# Patient Record
Sex: Female | Born: 1945 | Race: Black or African American | Hispanic: No | Marital: Married | Smoking: Former smoker
Health system: Southern US, Community
[De-identification: ages and names within clinical notes are randomized; demographics above are authoritative.]

## PROBLEM LIST (undated history)

## (undated) DIAGNOSIS — I10 Essential (primary) hypertension: Secondary | ICD-10-CM

## (undated) DIAGNOSIS — I471 Supraventricular tachycardia: Secondary | ICD-10-CM

## (undated) DIAGNOSIS — I272 Pulmonary hypertension, unspecified: Secondary | ICD-10-CM

## (undated) DIAGNOSIS — R739 Hyperglycemia, unspecified: Secondary | ICD-10-CM

## (undated) DIAGNOSIS — E785 Hyperlipidemia, unspecified: Secondary | ICD-10-CM

## (undated) DIAGNOSIS — D638 Anemia in other chronic diseases classified elsewhere: Secondary | ICD-10-CM

## (undated) DIAGNOSIS — E7800 Pure hypercholesterolemia, unspecified: Secondary | ICD-10-CM

## (undated) DIAGNOSIS — I739 Peripheral vascular disease, unspecified: Secondary | ICD-10-CM

## (undated) DIAGNOSIS — I5032 Chronic diastolic (congestive) heart failure: Secondary | ICD-10-CM

## (undated) DIAGNOSIS — J449 Chronic obstructive pulmonary disease, unspecified: Secondary | ICD-10-CM

## (undated) DIAGNOSIS — N189 Chronic kidney disease, unspecified: Secondary | ICD-10-CM

## (undated) DIAGNOSIS — K429 Umbilical hernia without obstruction or gangrene: Secondary | ICD-10-CM

## (undated) DIAGNOSIS — I480 Paroxysmal atrial fibrillation: Secondary | ICD-10-CM

## (undated) DIAGNOSIS — I4719 Other supraventricular tachycardia: Secondary | ICD-10-CM

## (undated) DIAGNOSIS — N184 Chronic kidney disease, stage 4 (severe): Secondary | ICD-10-CM

## (undated) HISTORY — DX: Umbilical hernia without obstruction or gangrene: K42.9

## (undated) HISTORY — DX: Peripheral vascular disease, unspecified: I73.9

## (undated) HISTORY — DX: Pure hypercholesterolemia, unspecified: E78.00

## (undated) HISTORY — PX: TUBAL LIGATION: SHX77

---

## 2007-12-09 ENCOUNTER — Ambulatory Visit: Payer: Self-pay | Admitting: Family Medicine

## 2007-12-09 ENCOUNTER — Observation Stay (HOSPITAL_COMMUNITY): Admission: EM | Admit: 2007-12-09 | Discharge: 2007-12-10 | Payer: Self-pay | Admitting: Emergency Medicine

## 2007-12-09 IMAGING — CR DG CHEST 1V PORT
1 series · 1 of 1 positions shown · non-contrast
Comparison: None

CLINICAL DATA: Chest pain

PORTABLE CHEST - 1 VIEW

[view not recorded]
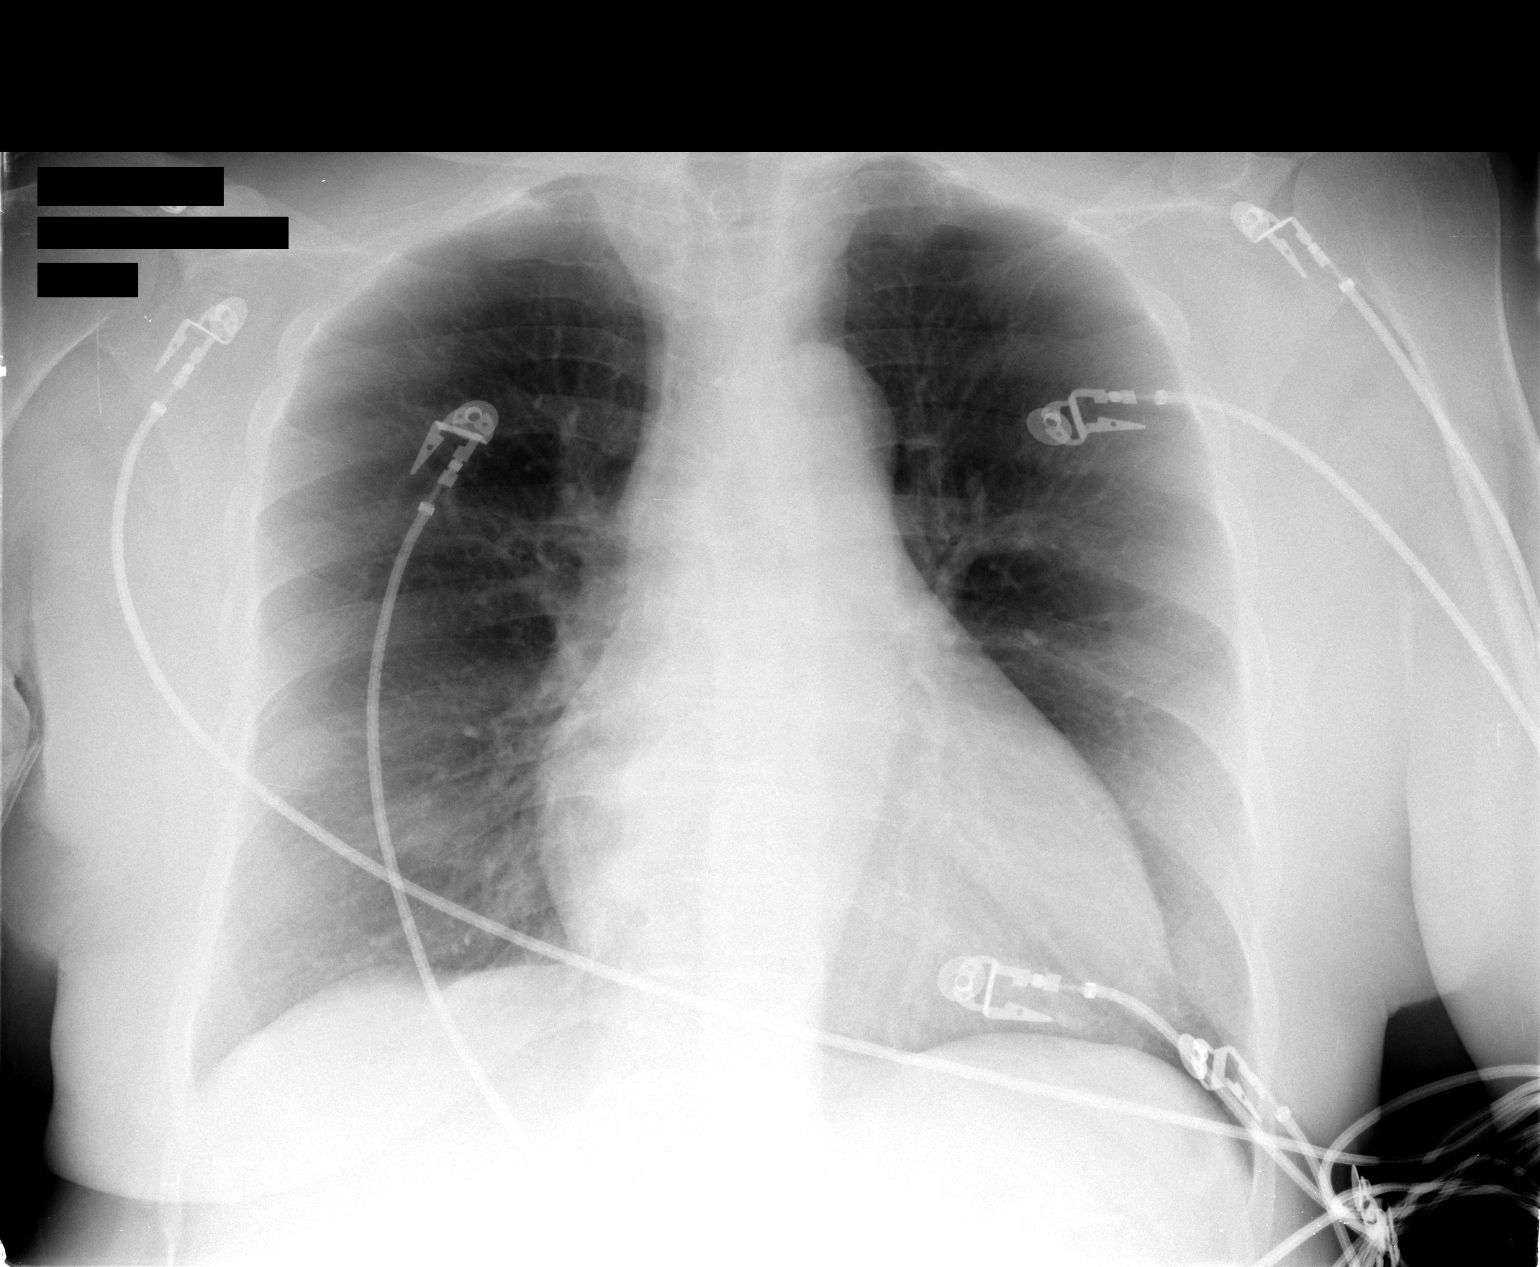

[1 of 1 positions shown; findings below may reference images not displayed]

FINDINGS: Borderline cardiomegaly noted.  No acute infiltrate or
pleural effusion.  No pulmonary edema.  The bony structures are
unremarkable.
IMPRESSION: No active disease.  Borderline cardiomegaly.

## 2007-12-10 ENCOUNTER — Encounter: Payer: Self-pay | Admitting: Family Medicine

## 2007-12-10 DIAGNOSIS — I10 Essential (primary) hypertension: Secondary | ICD-10-CM | POA: Insufficient documentation

## 2007-12-10 LAB — CONVERTED CEMR LAB
HDL: 37 mg/dL
LDL Cholesterol: 99 mg/dL

## 2007-12-11 ENCOUNTER — Encounter: Payer: Self-pay | Admitting: Family Medicine

## 2007-12-12 ENCOUNTER — Encounter: Payer: Self-pay | Admitting: Family Medicine

## 2007-12-12 ENCOUNTER — Ambulatory Visit: Payer: Self-pay | Admitting: Family Medicine

## 2007-12-12 LAB — CONVERTED CEMR LAB
ALT: <8 units/L (ref 0–35)
AST: 16 units/L (ref 0–37)
Albumin: 4.4 g/dL (ref 3.5–5.2)
Alkaline Phosphatase: 96 units/L (ref 39–117)
BUN: 44 mg/dL — ABNORMAL HIGH (ref 6–23)
CO2: 21 meq/L (ref 19–32)
Calcium: 9.6 mg/dL (ref 8.4–10.5)
Chloride: 106 meq/L (ref 96–112)
Creatinine, Ser: 1.93 mg/dL — ABNORMAL HIGH (ref 0.40–1.20)
Glucose, Bld: 98 mg/dL (ref 70–99)
Potassium: 5.1 meq/L (ref 3.5–5.3)
Sodium: 140 meq/L (ref 135–145)
Total Bilirubin: 0.4 mg/dL (ref 0.3–1.2)
Total Protein: 8.3 g/dL (ref 6.0–8.3)

## 2007-12-22 ENCOUNTER — Ambulatory Visit: Payer: Self-pay | Admitting: Family Medicine

## 2007-12-22 DIAGNOSIS — N185 Chronic kidney disease, stage 5: Secondary | ICD-10-CM | POA: Insufficient documentation

## 2007-12-22 DIAGNOSIS — Z8679 Personal history of other diseases of the circulatory system: Secondary | ICD-10-CM | POA: Insufficient documentation

## 2007-12-22 DIAGNOSIS — N183 Chronic kidney disease, stage 3 (moderate): Secondary | ICD-10-CM

## 2007-12-22 DIAGNOSIS — D649 Anemia, unspecified: Secondary | ICD-10-CM | POA: Insufficient documentation

## 2007-12-22 LAB — CONVERTED CEMR LAB

## 2007-12-30 ENCOUNTER — Encounter (INDEPENDENT_AMBULATORY_CARE_PROVIDER_SITE_OTHER): Payer: Self-pay | Admitting: *Deleted

## 2008-08-30 ENCOUNTER — Emergency Department (HOSPITAL_COMMUNITY): Admission: EM | Admit: 2008-08-30 | Discharge: 2008-08-30 | Payer: Self-pay | Admitting: Emergency Medicine

## 2008-08-30 IMAGING — CT CT ABDOMEN W/O CM
1 of 2 series · 13 of 32 positions shown, 19 images · non-contrast
Comparison: None

CT ABDOMEN

CLINICAL DATA: Epigastric pain with nausea and vomiting for 1 day.
Renal insufficiency.

CT ABDOMEN AND PELVIS WITHOUT CONTRAST
TECHNIQUE: Multidetector CT imaging of the abdomen and pelvis was
performed following the standard protocol without intravenous
contrast.

[Series 2: rtn ap without · axial · non-contrast · 0.74mm/px · z∈[-418,-14]mm · 13 of 95 slices shown, 19 images]
[im 7/95  soft-tissue]
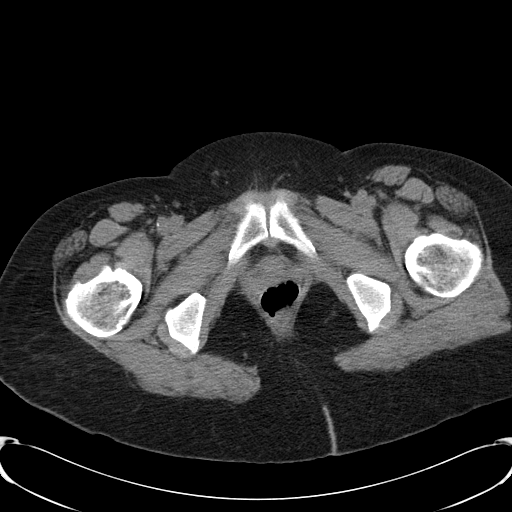
[im 7/95  bone]
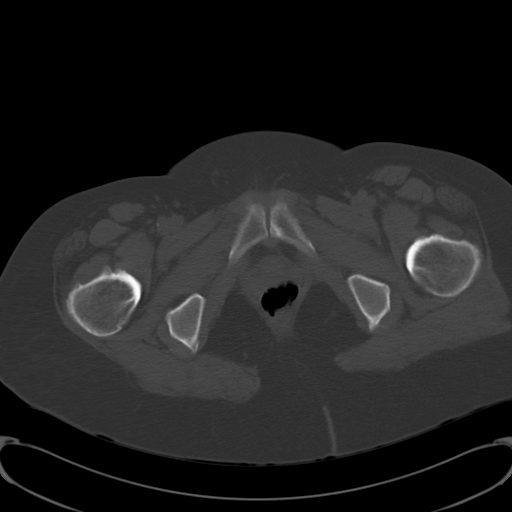
[im 13/95  soft-tissue]
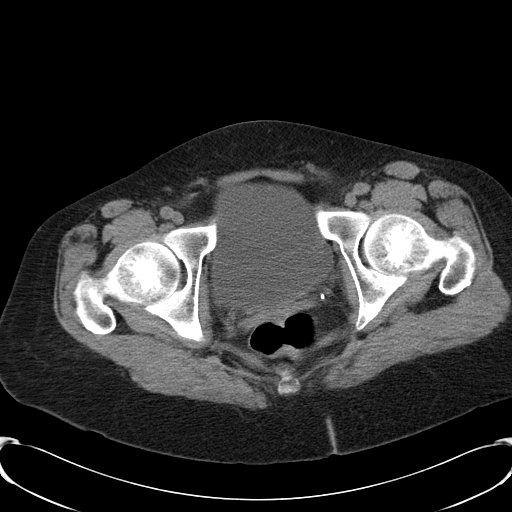
[im 19/95  soft-tissue]
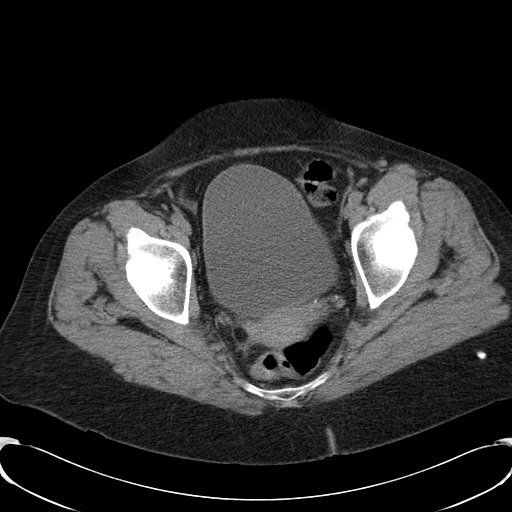
[im 26/95  soft-tissue]
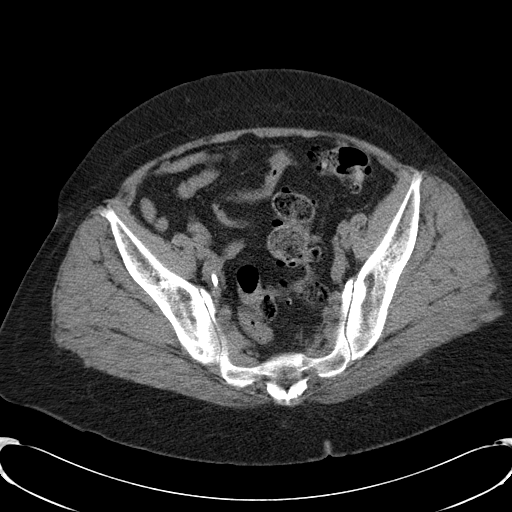
[im 32/95  soft-tissue]
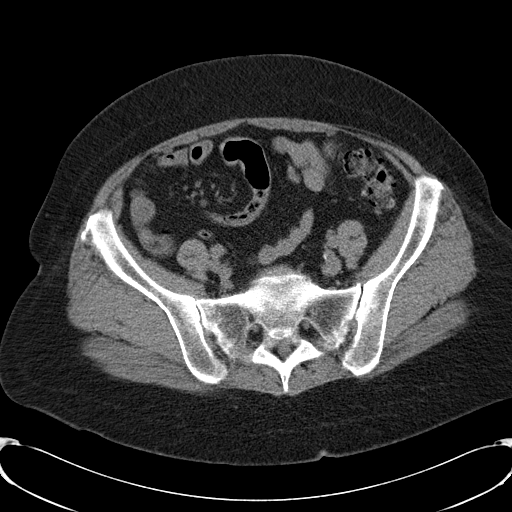
[im 38/95  soft-tissue]
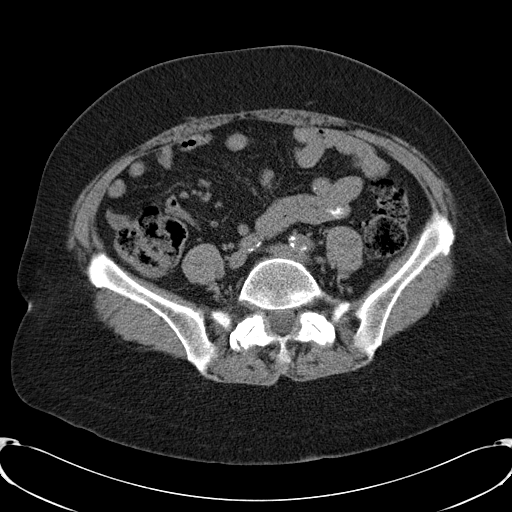
[im 51/95  soft-tissue]
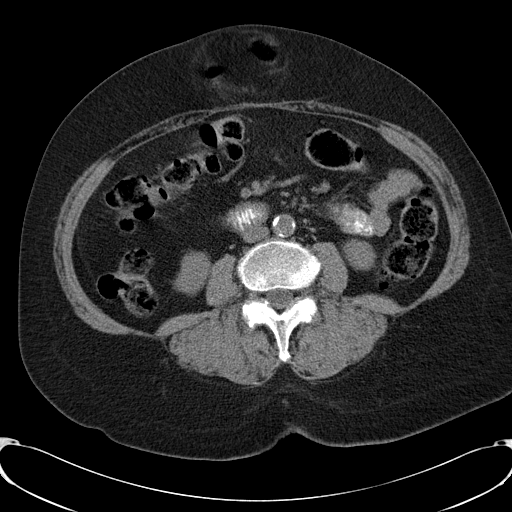
[im 57/95  soft-tissue]
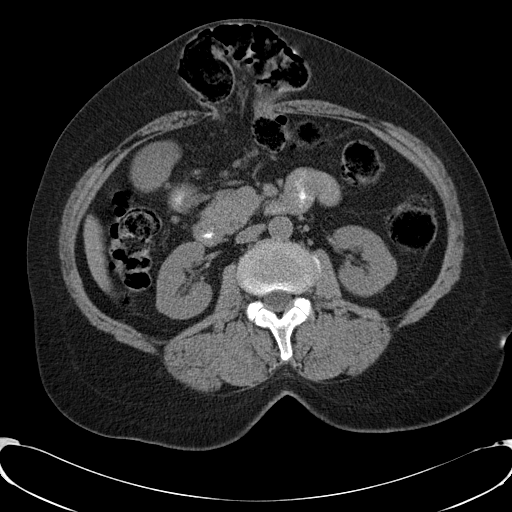
[im 63/95  soft-tissue]
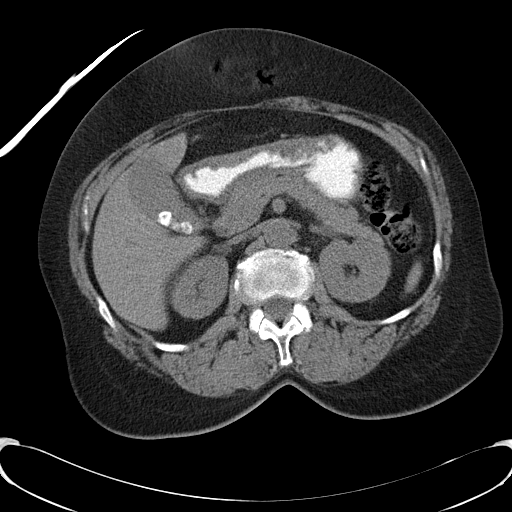
[im 63/95  bone]
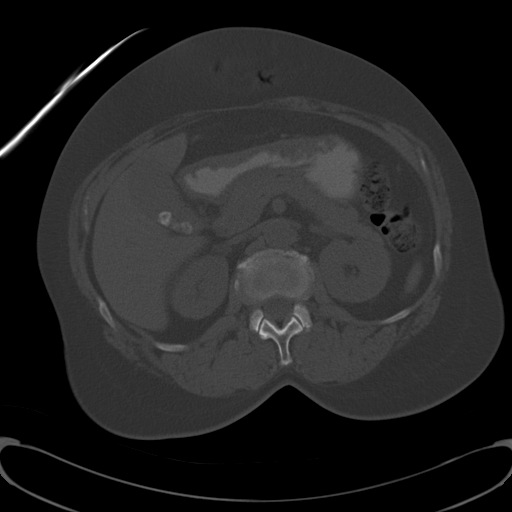
[im 69/95  soft-tissue]
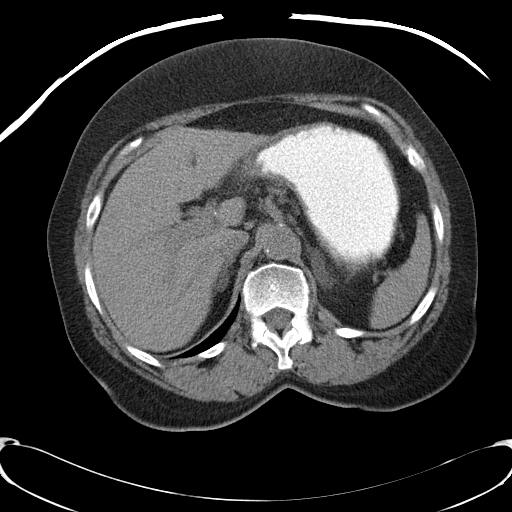
[im 69/95  lung]
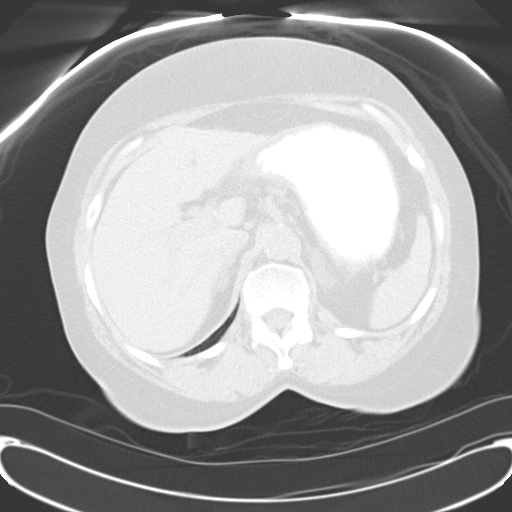
[im 76/95  soft-tissue]
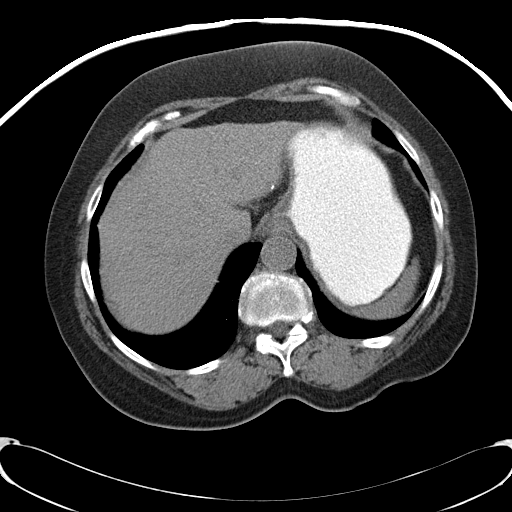
[im 76/95  lung]
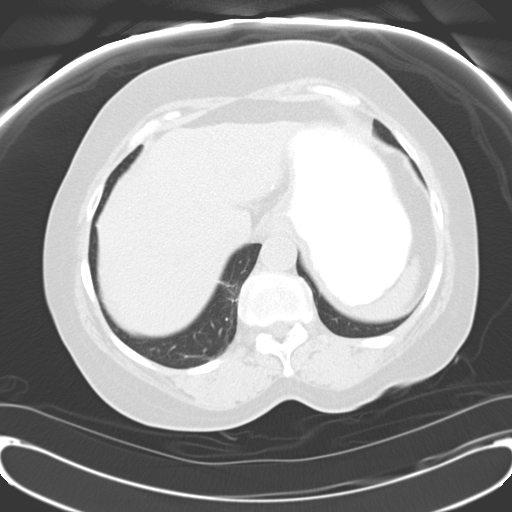
[im 82/95  soft-tissue]
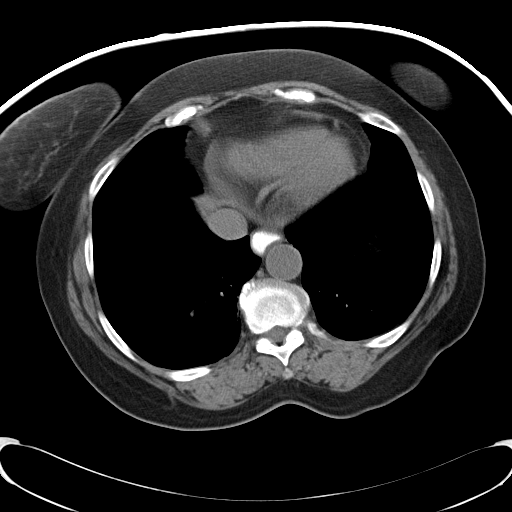
[im 82/95  lung]
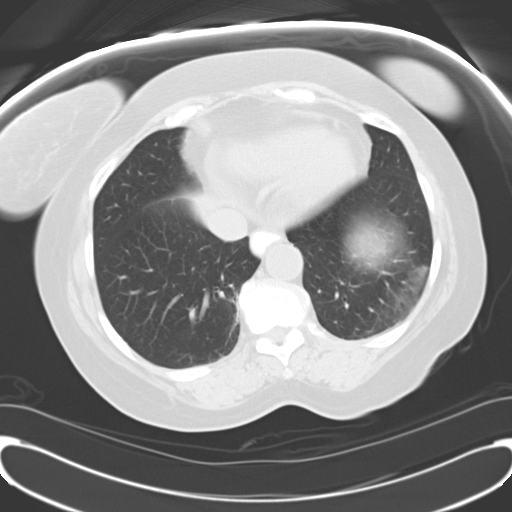
[im 88/95  soft-tissue]
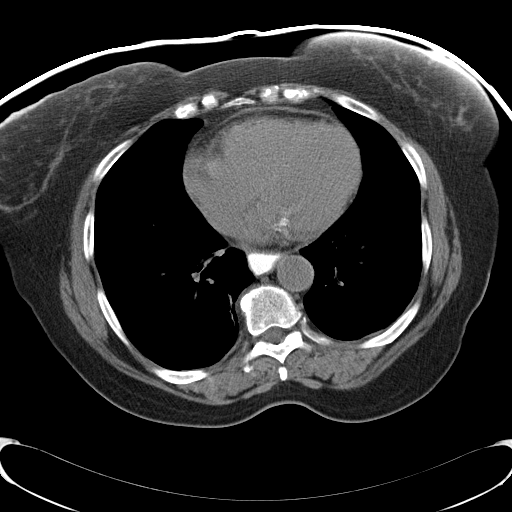
[im 88/95  lung]
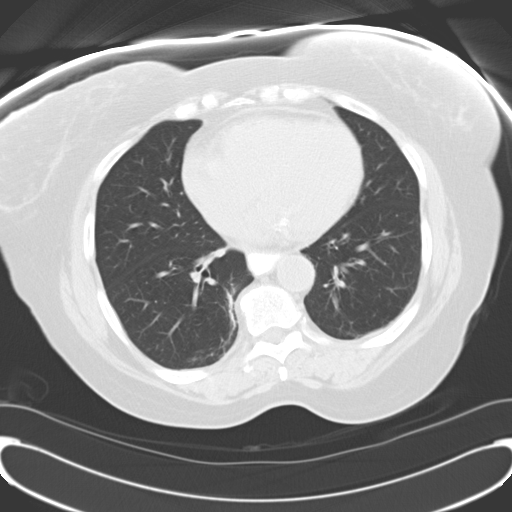

[13 of 32 positions shown; findings below may reference images not displayed]

FINDINGS: The lung bases are clear aside from mild scarring
medially in the right lower lobe adjacent to thoracic spine
osteophytes.  There is no pleural effusion.

The stomach is well opacified with contrast.  There is possible
mild thickening of the walls of the distal stomach and proximal
duodenum.  There is no evidence of bowel obstruction.  However,
there is a large supraumbilical hernia containing transverse colon.
There is no evidence of bowel incarceration.  No extraluminal fluid
collections are identified.

Several calcified gallstones are present.  There is no gallbladder
wall thickening or evidence of biliary dilatation.  As imaged in
the noncontrast state, the liver, spleen, pancreas and kidneys
appear normal.  There is no hydronephrosis.  The right adrenal
gland appears normal.  The left adrenal gland demonstrates low
density enlargement compatible with an adenoma.

No enlarged lymph nodes are seen.  There is moderate facet disease
of the lower lumbar spine.
IMPRESSION: 1.  Large supraumbilical abdominal wall hernia containing
transverse colon.  There is no evidence of bowel incarceration or
obstruction.  Correlate clinically.
2.  Possible gastric and proximal small bowel wall thickening,
nonspecific.  Correlate clinically.
3.  Cholelithiasis.
4.  Left adrenal adenoma.

CT PELVIS
FINDINGS: There is no pelvic mass, fluid collection or
inflammatory process.  The uterus and ovaries appear normal.  The
appendix appears normal.  There are mild sigmoid colon diverticular
changes without surrounding inflammation.
IMPRESSION: No acute pelvic findings.

## 2010-05-02 NOTE — Miscellaneous (Signed)
Summary: ETT APPT  YOUR APPT FOR YOUR STRESS TEST IS SCHEDULED FOR: January 21, 2008 AT 11:15AM AT [facility]. PLEASE FOLLOW THE INSTRUCTIONS  ON THE BLUE ATTACHED FORM. IF YOU HAVE FURTHER QUESTIONS, FEEL FREE TO CALL   ABOVE INFO MAILED.

## 2010-05-02 NOTE — Assessment & Plan Note (Signed)
Summary: np per [facility]   Vital Signs:  Patient Profile:   65 Years Old Female Weight:      194.0 pounds Temp:     97.9 degrees F oral Pulse rate:   81 / minute BP sitting:   158 / 84  (left arm)  Vitals Entered By: Carolyne Littles (December 22, 2007 2:47 PM)             Is Patient Diabetic? No     PCP:  Hydrologist Complaint:  np.  History of Present Illness: 65 year old female with hx HTN and Hx chest pain presents for hospital follow up, was ruled out for MI.  Has been compliant with home meds and has no further episodes of chest pain.  Would like to start seeing a physician at the [facility] on a regular basis.  Also c/o protrusion on her belly, just above umbilicus.  Appears to be a ventral hernia vs diastasis recti.  No pain, no discomfort, no obstructive symptoms, just is a cosmetic concern.  No blurry vision, HA, numbness, weakness, tingling, SOB, CP, N/V/D/C, orthopnea, leg swelling, fevers/chills, weight change.    Past Medical History:    HTN    Anemia-NOS    Renal insufficiency  Past Surgical History:    BTL - 1978   Family History:    1.Mother deceased from diabetes mellitus/hypertension.     2.Father deceased at age 19 of MI.     3.Brother deceased at age 47 MI  Social History:    Lives with husband and son.      Positive tobacco history 1/2-1 pack per day x30 years.      SHe denies EtOH, denies illicit drug use.   Risk Factors:  Tobacco use:  quit Passive smoke exposure:  no Drug use:  no HIV high-risk behavior:  no  PAP Smear History:     Date of Last PAP Smear:  12/22/2007  PAP Smear History:     Date of Last PAP Smear:  12/22/2007    Results:  refused  Mammogram History:     Date of Last Mammogram:  12/22/2007  Mammogram History:     Date of Last Mammogram:  12/22/2007    Results:  refused  Colonoscopy History:     Date of Last Colonoscopy:  12/22/2007  Colonoscopy History:     Date of Last Colonoscopy:  12/22/2007    Results:   refused  HDL Result Date:  12/10/2007 HDL Result:  37 LDL Result Date:  12/10/2007 LDL Result:  99 Colonoscopy Result:  refused PAP Result:  refused Mammogram Result:  refused   Review of Systems       As above in HPI   Physical Exam  General:     Well-developed,well-nourished,in no acute distress; alert,appropriate and cooperative throughout examination Head:     Normocephalic and atraumatic without obvious abnormalities. No apparent alopecia or balding. Eyes:     No corneal or conjunctival inflammation noted. EOMI. Perrla. Ears:     External ear exam shows no significant lesions or deformities.   Nose:     External nasal examination shows no deformity or inflammation.  Mouth:     Oral mucosa and oropharynx without lesions or exudates.  Neck:     No deformities, masses, or tenderness noted. Lungs:     Normal respiratory effort, chest expands symmetrically. Lungs are clear to auscultation, no crackles or wheezes. Heart:     Normal rate and regular rhythm. S1  and S2 normal without gallop, murmur, click, rub or other extra sounds. Abdomen:     Bowel sounds positive,abdomen soft and non-tender without masses, organomegaly or hernias noted. Extremities:     No clubbing, cyanosis, edema, or deformity noted with normal full range of motion of all joints.   Neurologic:     No cranial nerve deficits noted. Station and gait are normal. Plantar reflexes are down-going bilaterally. DTRs are symmetrical throughout. Sensory, motor and coordinative functions appear intact. Skin:     Intact without suspicious lesions or rashes    Impression & Recommendations:  Problem # 1:  ESSENTIAL HYPERTENSION, BENIGN (ICD-401.1) Pt  still has elevated BP.  Goal will be <120/80.  Pt cannot afford norvasc that she was discharged on, so will be changed to Metoprolol and HCTZ.  Both are on  the $4 [facility] list.  Will adjust these to meet goal BP, and having a B-blocker will be useful for a patient  with possible cardiac pathology.    Her updated medication list for this problem includes:    Hydrochlorothiazide 25 Mg Tabs (Hydrochlorothiazide) .Marland Kitchen... 1 tab by mouth once daily    Metoprolol Tartrate 25 Mg Tabs (Metoprolol tartrate) .Marland Kitchen... 1 tab by mouth two times a day   Problem # 2:  CHEST PAIN, HX OF (ICD-V12.50) Pt was ruled out for MI in hospital but still needs outpatient Exercise Treadmill test to help assess for significant CAD.  Will schedule this.    Orders: ETT (ETT)   Problem # 3:  RENAL INSUFFICIENCY (ICD-588.9) LIkely 2/2 long standing hypertension.  Creatinine is worse now than at hospital discharge 1.93 vs 1.38.  Recommend renal ultrasound, nephrology referral, urinalysis, FeNa, and follow up BMET at next visit.  No electrolyte abnormalities.  Problem # 4:  Screening Breast Cancer (ICD-V76.10) Pt has no insurance and declined mammogram at this time, will try to get medicaid and then return for follow up.    Problem # 5:  Screening Cervical Cancer (ICD-V76.2) Pt will be scheduled for full physical and PAP smear at next visit.   Problem # 6:  Preventive Health Care (ICD-V70.0) Pt has no insurance, wishes to get medicaid prior to getting colon cancer screening, colonoscopy declined. Once again, pt will call us when she gets medicaid.   Complete Medication List: 1)  Hydrochlorothiazide 25 Mg Tabs (Hydrochlorothiazide) .Marland Kitchen.. 1 tab by mouth once daily 2)  Metoprolol Tartrate 25 Mg Tabs (Metoprolol tartrate) .Marland Kitchen.. 1 tab by mouth two times a day 3)  Aspirin Ec Low Dose 81 Mg Tbec (Aspirin) .Marland Kitchen.. 1 tab by mouth once daily   Patient Instructions: 1)  Good to meet you today.  Please let us know when your medicaid information is completed.  I will then have Dr. Buelah Manis set you up for a colonoscopy, mammogram, and ETT.  I will also have her do your physical and PAP smear then.   2)  I am changing your meds to those on the $4 list at [facility].  HCTZ, Metoprolol, and Aspirin will be  your new meds. 3)  Have my nurses make an appointment made to see Dr. Buelah Manis for a physical exam and PAP smear at her next available slot. 4)  -Dr. Darene Lamer.   Prescriptions: ASPIRIN EC LOW DOSE 81 MG TBEC (ASPIRIN) 1 tab by mouth once daily  #30 x 6   Entered and Authorized by:   Aundria Mems MD   Signed by:   Aundria Mems MD on 12/22/2007   Method  used:   Electronically to        [facility] (retail)       [address]       [place], [place]  [address]       Ph: [phone]       Fax: [phone]   RxID:   [id number] METOPROLOL TARTRATE 25 MG TABS (METOPROLOL TARTRATE) 1 tab by mouth two times a day  #60 x 6   Entered and Authorized by:   Aundria Mems MD   Signed by:   Aundria Mems MD on 12/22/2007   Method used:   Electronically to        [facility] (retail)       [place], [place]  [address]       Ph: [phone]       Fax: [phone]   RxID:   [id number] HYDROCHLOROTHIAZIDE 25 MG TABS (HYDROCHLOROTHIAZIDE) 1 tab by mouth once daily  #30 x 6   Entered and Authorized by:   Aundria Mems MD   Signed by:   Aundria Mems MD on 12/22/2007   Method used:   Electronically to        [facility] (retail)       [address]       [place], [place]  [address]       Ph: [phone]       Fax: [phone]   RxID:   [id number]  ]

## 2010-05-03 ENCOUNTER — Encounter: Payer: Self-pay | Admitting: *Deleted

## 2010-07-11 LAB — URINALYSIS, ROUTINE W REFLEX MICROSCOPIC
Bilirubin Urine: NEGATIVE
Glucose, UA: NEGATIVE mg/dL
Hgb urine dipstick: NEGATIVE
Ketones, ur: NEGATIVE mg/dL
Nitrite: NEGATIVE
Protein, ur: NEGATIVE mg/dL
Specific Gravity, Urine: 1.015 (ref 1.005–1.030)
Urobilinogen, UA: 0.2 mg/dL (ref 0.0–1.0)
pH: 5 (ref 5.0–8.0)

## 2010-07-11 LAB — COMPREHENSIVE METABOLIC PANEL
ALT: 10 U/L (ref 0–35)
AST: 25 U/L (ref 0–37)
Albumin: 3.9 g/dL (ref 3.5–5.2)
Alkaline Phosphatase: 89 U/L (ref 39–117)
BUN: 73 mg/dL — ABNORMAL HIGH (ref 6–23)
CO2: 27 mEq/L (ref 19–32)
Calcium: 9.6 mg/dL (ref 8.4–10.5)
Chloride: 101 mEq/L (ref 96–112)
Creatinine, Ser: 3.11 mg/dL — ABNORMAL HIGH (ref 0.4–1.2)
GFR calc Af Amer: 18 mL/min — ABNORMAL LOW (ref >60)
GFR calc non Af Amer: 15 mL/min — ABNORMAL LOW (ref >60)
Glucose, Bld: 119 mg/dL — ABNORMAL HIGH (ref 70–99)
Potassium: 3.8 mEq/L (ref 3.5–5.1)
Sodium: 138 mEq/L (ref 135–145)
Total Bilirubin: 0.7 mg/dL (ref 0.3–1.2)
Total Protein: 8.1 g/dL (ref 6.0–8.3)

## 2010-07-11 LAB — CBC
HCT: 37.6 % (ref 36.0–46.0)
Hemoglobin: 12.5 g/dL (ref 12.0–15.0)
MCHC: 33.3 g/dL (ref 30.0–36.0)
MCV: 90.9 fL (ref 78.0–100.0)
Platelets: 259 10*3/uL (ref 150–400)
RBC: 4.14 MIL/uL (ref 3.87–5.11)
RDW: 12.3 % (ref 11.5–15.5)
WBC: 8.1 10*3/uL (ref 4.0–10.5)

## 2010-07-11 LAB — DIFFERENTIAL
Basophils Absolute: 0 K/uL (ref 0.0–0.1)
Basophils Relative: 0 % (ref 0–1)
Eosinophils Absolute: 0.2 K/uL (ref 0.0–0.7)
Eosinophils Relative: 3 % (ref 0–5)
Lymphocytes Relative: 13 % (ref 12–46)
Lymphs Abs: 1.1 K/uL (ref 0.7–4.0)
Monocytes Absolute: 0.7 K/uL (ref 0.1–1.0)
Monocytes Relative: 9 % (ref 3–12)
Neutro Abs: 6 K/uL (ref 1.7–7.7)
Neutrophils Relative %: 75 % (ref 43–77)

## 2010-07-11 LAB — POCT I-STAT, CHEM 8
BUN: 63 mg/dL — ABNORMAL HIGH (ref 6–23)
Calcium, Ion: 1.15 mmol/L (ref 1.12–1.32)
Chloride: 107 meq/L (ref 96–112)
Creatinine, Ser: 2.7 mg/dL — ABNORMAL HIGH (ref 0.4–1.2)
Glucose, Bld: 124 mg/dL — ABNORMAL HIGH (ref 70–99)
HCT: 37 % (ref 36.0–46.0)
Hemoglobin: 12.6 g/dL (ref 12.0–15.0)
Potassium: 4.2 meq/L (ref 3.5–5.1)
Sodium: 140 meq/L (ref 135–145)
TCO2: 26 mmol/L (ref 0–100)

## 2010-08-15 ENCOUNTER — Encounter: Payer: Self-pay | Admitting: Sports Medicine

## 2010-08-15 NOTE — H&P (Signed)
NAME:  Stephanie Griffith, Stephanie Griffith                 ACCOUNT NO.:  [id number]   MEDICAL RECORD NO.:  [id number]          PATIENT TYPE:  OBV   LOCATION:  4705                         FACILITY:  [facility]   PHYSICIAN:  Talbert Cage, M.D.DATE OF BIRTH:  October 03, 1945   DATE OF ADMISSION:  12/09/2007  DATE OF DISCHARGE:                              HISTORY & PHYSICAL   CHIEF COMPLAINT:  Chest pain.   HISTORY OF PRESENT ILLNESS:  A 65 year old female with no significant  past medical history presents to ED with acute onset of recurrent chest  pain.  The patient states that at 11 a.m. yesterday morning had episode  of mid sternal chest pain, described as indigestion which was self-  limited.  States early this a.m. around 1:30, awoke with mid sternal  chest pain, characterized as pressure-like/chest tightening feeling,  nonradiating with no aggravating or alleviating factors which lasted for  approximately 15 minutes and relived by lying down.  The patient had  similar chest pain which returned around 10 a.m. this morning with  substernal chest pressure.  The patient called EMS.  Both episodes this  a.m. associated with palpitations and shortness of breath as well as  nausea and emesis x1.  The patient denies diaphoresis or headache.  States chest pain at 10 a.m. lasted for greater than 30 minutes, but was  relieved by sublingual nitroglycerin given by EMS  and ED.   PAST MEDICAL HISTORY:  None.   The patient does not have PCP and has not had any recent  hospitalizations.   PAST SURGICAL HISTORY:  BTL in 1978.   FAMILY HISTORY:  1.Mother deceased from diabetes mellitus/hypertension.  2.Father deceased at age 1 of MI.  3.Brother deceased at age 87 MI   MEDICATIONS:  Os-Cal 500 mg daily.   ALLERGIES:  No known drug allergies.   SOCIAL HISTORY:  Lives with husband and son.  Positive tobacco history  1/2-1 pack per day x30 years.  He denies EtOH, denies illicit drug use.   REVIEW OF SYSTEMS:   Positive chest pain, positive shortness of breath,  positive nausea, emesis x1.  Negative for headache, neurologic findings,  diarrhea, constipation, muscle aches or pains.  Positive diaphoresis and  chills.   PHYSICAL EXAMINATION:  VITAL SIGNS:  Temperature 98; heart rate 80,  range 80-110; respiratory rate 22, range 18-34; BP 189/106 down to  176/98.  BP during exam 208/106.  O2 sat 98-100% on room air.  GENERAL:  No acute distress.  Alert and oriented x3, pleasant.  HEENT:  PERRL, EOMI, nonicteric.  Ophtho exam, no exudates.  No  papillary edema.  Moist mucous membrane.  Oropharynx clear.  Nonicteric.  NECK:  No lymphadenopathy.  Supple.  CVS:  Regular rate and rhythm.  No murmurs, rubs, or gallops.  Pulse 85.  RESPIRATORY:  CTAB.  ABDOMEN:  Positive bowel sounds, soft, mild tenderness to palpation over  ventral hernia, easily reduced.  No organomegaly.  No right upper  quadrant pain, nondistended.  EXTREMITIES:  No edema.  Pulses 2+, capillary refill 2 seconds.  NEUROLOGIC:  Motor/sensory grossly intact.  Motor  5/5.  No focal  deficits.   LABORATORY DATA:  EKG, sinus tachycardia, borderline left atrial  enlargement.  CBC, WBC 7.1, hemoglobin 12.2, hematocrit 36.9, platelets  225, normal differential.  I-STAT, BMET, sodium 145, potassium 4.1,  chloride 110, CO2 27, BUN 19, creatinine 1.5, glucose 98.  Point-of-care  x1 negative.  Troponin less than 0.05, CK-MB 2.4, myoglobin 104.  Chest  x-ray, no active disease, borderline cardiomegaly.   EMERGENCY DEPARTMENT COURSE:  Given sublingual nitroglycerin by EMS,  repeat nitroglycerin sublingual in ED with relief of chest pain.  Labetalol 20 mg IV x1 given to reduce BP.   ASSESSMENT AND PLAN:  A 65 year old female admitted with chest pain and  hypertensive urgency for rule out myocardial ischemia workup.  1. Chest pain:  Chest pain typical, concern for acute coronary      syndrome as chest pain is new onset.  Will repeat EKG in the  a.m.,      cardiac enzymes x3.  Currently, point-of-care showed no evidence of      ischemia or ST elevation, although non-STEMI cannot be ruled out.      BMET, CBC, TSH, and fasting lipid panel in a.m.  Will continue with      sublingual nitroglycerin p.r.n. chest pain and start Aspirin      therapy. Other differentials include GERD, musculoskeletal chest      pain.  2. Hypertensive urgency:  The patient has elevated blood pressure upon      admission with some resolution with IV labetalol.  We will start      hydrochlorothiazide 25 p.o. daily, Norvasc 10 mg p.o. daily.  Goals      for blood pressure 160-170/80-90, as not to rapidly decrease blood      pressure and cause hypoperfusion.  We will reevaluate blood      pressure until stable and pt assymptomatic.  If the patient is      symptomatic or blood pressures continue to be in high [id number] for      systolic, we will give IV labetalol p.r.n.  3. Fluids, electrolytes, nutrition/gastrointestinal:  The patient has      a slightly elevated creatinine most likely secondary to      uncontrolled hypertension.  We will continue to follow up.  We will      start heart-healthy diet.  Follow up on electrolytes and fluid      status during admission.  4. Deep vein thrombosis/gastrointestinal prophylaxis:  Protonix 40 mg      daily, Lovenox 40 mg subcu daily.  5. Disposition:  Seeing improvement in her chest pain/cardiac workup,      we will assess for Cardiology consult for workup results.  The      patient will also need PCP upon discharge.      Vic Blackbird, MD  Electronically Signed      Talbert Cage, M.D.  Electronically Signed    KD/MEDQ  D:  12/09/2007  T:  12/10/2007  Job:  [id number]

## 2010-08-15 NOTE — Discharge Summary (Signed)
NAME:  Stephanie Griffith, FISHBACK                 ACCOUNT NO.:  [id number]   MEDICAL RECORD NO.:  [id number]          PATIENT TYPE:  OBV   LOCATION:  4705                         FACILITY:  [facility]   PHYSICIAN:  Talbert Cage, M.D.DATE OF BIRTH:  [birth year]   DATE OF ADMISSION:  12/09/2007  DATE OF DISCHARGE:  12/10/2007                               DISCHARGE SUMMARY   DISCHARGE DIAGNOSES:  1. Chest pain.  2. Hypertension.  3. Elevated creatinine.  4. Anemia.  5. Tobacco dependence.   CONSULTS:  None.   PROCEDURE/STUDIES:  1. EKG date December 09, 2007, sinus tachycardia, mild left atrial      dilation.  2. EKG; December 10, 2007, normal sinus rhythm.  3. Chest x-ray; no active disease and borderline cardiomegaly.   DISCHARGE LABORATORY DATA:  BMET; sodium 139, potassium 3.7, chloride  107, CO2 of 26, BUN 17, creatinine 1.38 decreased from admission  creatinine of 1.5, glucose 100, and calcium 9.2.  CBC; white count 6.5,  hemoglobin 11.9 mild decrease from admission hemoglobin of 12.2,  hematocrit 35.5, platelets 216, MCV 91.2, and RDW 13.5.  TSH within  normal limits.  Fasting lipid panel; total cholesterol 151,  triglycerides 76, HDL 37, and LDL 99.  Cardiac enzymes plan-of-care  negative x1.  Cardiac enzymes on floor first set troponin 0.08, CK-MB  2.9, and CK 112.  Second set troponin 0.06, CK-MB 2.9, and CK 101.  Third set troponin 0.04, CK-MB 2.3, and CK 88.   BRIEF HISTORY AND PHYSICAL:  A 65 year old female with no significant  past medical history with chest pain and hypertensive urgency admitted  for ACS, rule out MI work up.   HOSPITAL COURSE:  1. Chest pain.  The patient with typical chest pain; however, negative      myocardial ischemia workup with negative EKG and plan-of-care      negative x1.  Initial set of cardiac enzymes showed a mildly      elevated troponin I of 0.08, subsequently second and third set of      enzymes negative.  Mild elevation in first set of  troponins could      be due to some cardiac strain representing angina, CHF, or COPD.      However, overall cardiac workup does not suggest acute myocardial      ischemia.  Chest pain may actually be stress related and the      patient has history of tobacco dependence.  Further risk      stratification as outpatient needed with newly diagnosed      hypertension, tobacco dependence, and family history.  The patient      is a good candidate for outpatient exercise stress treadmill      testing.  2. Hypertension.  The patient initially admitted with hypertensive      urgency.  BP was lowered slowly with HCTZ and Norvasc.  Besides      initial admission chest pain, the patient was asymptomatic from      elevated BP, most likely long history of uncontrolled hypertension      without proper diagnosis  or antihypertensive medications.  We will      continue HCTZ and Norvasc upon discharge.  3. Elevated creatinine.  The patient had elevated creatinine of 1.5 on      admission which decreased to 1.38, most likely secondary to      uncontrolled hypertension and not acute renal failure.  We will      monitor as outpatient especially with new medications.  The patient      will have BMETs drawn on Friday, December 12, 2007, in clinic to      look at renal function and will follow up with new PCP at [facility].  4. Anemia.  The patient with normocytic anemia; however, hemoglobin is      stable.  No signs of acute blood loss.  Will need preventive care      such as screening colonoscopy and anemia workup outpatient if      needed.  5. Tobacco dependence.  The patient currently one-half to one pack per      day x30 year history of tobacco dependence, did not receive      nicotine patch during at admission, and did not state desire to      quit during admission.  Will get tobacco cessation/counseling per      PCP.   DISCHARGE MEDICATIONS AND INSTRUCTIONS:  1.  Hydrochlorothiazide 25 mg daily.  2. Norvasc 10 mg daily.  3. Aspirin 81 mg daily.  4. The patient is to return if chest pain reoccurs or has difficulty      breathing.  5. She is to followup BMET to look at renal function and risk      stratification for outpatient Cardiology workup.  The patient will      follow up with new PCP, Dr. Dianah Field on December 22, 2007, at      2:45 p.m. [place].      Vic Blackbird, MD  Electronically Signed      Talbert Cage, M.D.  Electronically Signed    KD/MEDQ  D:  12/11/2007  T:  12/11/2007  Job:  [id number]   cc:   Aundria Mems, MD

## 2011-01-03 LAB — CBC
HCT: 35.5 — ABNORMAL LOW
HCT: 36.9
Hemoglobin: 11.9 — ABNORMAL LOW
Hemoglobin: 12.2
MCHC: 33.1
MCHC: 33.4
MCV: 91.2
MCV: 91.7
Platelets: 216
Platelets: 225
RBC: 3.9
RBC: 4.02
RDW: 13.2
RDW: 13.5
WBC: 6.5
WBC: 7.1

## 2011-01-03 LAB — TSH: TSH: 0.808

## 2011-01-03 LAB — DIFFERENTIAL
Basophils Absolute: 0
Basophils Relative: 1
Eosinophils Absolute: 0.1
Eosinophils Relative: 2
Lymphocytes Relative: 14
Lymphs Abs: 1
Monocytes Absolute: 0.9
Monocytes Relative: 12
Neutro Abs: 5.1
Neutrophils Relative %: 72

## 2011-01-03 LAB — BASIC METABOLIC PANEL
BUN: 17
CO2: 26
Calcium: 9.2
Chloride: 107
Creatinine, Ser: 1.38 — ABNORMAL HIGH
GFR calc Af Amer: 47 — ABNORMAL LOW
GFR calc non Af Amer: 39 — ABNORMAL LOW
Glucose, Bld: 100 — ABNORMAL HIGH
Potassium: 3.7
Sodium: 139

## 2011-01-03 LAB — POCT I-STAT, CHEM 8
BUN: 19
Calcium, Ion: 1.23
Chloride: 110
Creatinine, Ser: 1.5 — ABNORMAL HIGH
Glucose, Bld: 98
HCT: 39
Hemoglobin: 13.3
Potassium: 4.1
Sodium: 145
TCO2: 27

## 2011-01-03 LAB — LIPID PANEL
Cholesterol: 151
HDL: 37 — ABNORMAL LOW
LDL Cholesterol: 99
Total CHOL/HDL Ratio: 4.1
Triglycerides: 76
VLDL: 15

## 2011-01-03 LAB — CARDIAC PANEL(CRET KIN+CKTOT+MB+TROPI)
CK, MB: 2.3
CK, MB: 2.9
Relative Index: 2.9 — ABNORMAL HIGH
Relative Index: INVALID
Total CK: 101
Total CK: 88
Troponin I: 0.04
Troponin I: 0.06

## 2011-01-03 LAB — CK TOTAL AND CKMB (NOT AT ARMC)
CK, MB: 2.9
Relative Index: 2.6 — ABNORMAL HIGH
Total CK: 112

## 2011-01-03 LAB — POCT CARDIAC MARKERS
CKMB, poc: 2.4
Myoglobin, poc: 104
Troponin i, poc: <0.05

## 2011-01-03 LAB — TROPONIN I: Troponin I: 0.08 — ABNORMAL HIGH

## 2011-05-31 ENCOUNTER — Emergency Department (HOSPITAL_COMMUNITY): Payer: Medicare Other

## 2011-05-31 ENCOUNTER — Inpatient Hospital Stay (HOSPITAL_COMMUNITY)
Admission: EM | Admit: 2011-05-31 | Discharge: 2011-06-15 | DRG: 239 | Disposition: A | Discharge status: Other Acute Inpatient Hospital | Payer: Medicare Other | Attending: Vascular Surgery | Admitting: Vascular Surgery

## 2011-05-31 ENCOUNTER — Encounter (HOSPITAL_COMMUNITY): Payer: Self-pay | Admitting: Emergency Medicine

## 2011-05-31 DIAGNOSIS — K8010 Calculus of gallbladder with chronic cholecystitis without obstruction: Secondary | ICD-10-CM | POA: Diagnosis present

## 2011-05-31 DIAGNOSIS — K8020 Calculus of gallbladder without cholecystitis without obstruction: Secondary | ICD-10-CM | POA: Diagnosis present

## 2011-05-31 DIAGNOSIS — L02619 Cutaneous abscess of unspecified foot: Secondary | ICD-10-CM | POA: Diagnosis present

## 2011-05-31 DIAGNOSIS — E46 Unspecified protein-calorie malnutrition: Secondary | ICD-10-CM | POA: Diagnosis present

## 2011-05-31 DIAGNOSIS — I96 Gangrene, not elsewhere classified: Secondary | ICD-10-CM

## 2011-05-31 DIAGNOSIS — Z23 Encounter for immunization: Secondary | ICD-10-CM

## 2011-05-31 DIAGNOSIS — N179 Acute kidney failure, unspecified: Secondary | ICD-10-CM | POA: Diagnosis present

## 2011-05-31 DIAGNOSIS — J9620 Acute and chronic respiratory failure, unspecified whether with hypoxia or hypercapnia: Secondary | ICD-10-CM | POA: Diagnosis not present

## 2011-05-31 DIAGNOSIS — I10 Essential (primary) hypertension: Secondary | ICD-10-CM

## 2011-05-31 DIAGNOSIS — J690 Pneumonitis due to inhalation of food and vomit: Secondary | ICD-10-CM | POA: Diagnosis not present

## 2011-05-31 DIAGNOSIS — R109 Unspecified abdominal pain: Secondary | ICD-10-CM | POA: Diagnosis present

## 2011-05-31 DIAGNOSIS — I70409 Unspecified atherosclerosis of autologous vein bypass graft(s) of the extremities, unspecified extremity: Secondary | ICD-10-CM | POA: Diagnosis not present

## 2011-05-31 DIAGNOSIS — I5031 Acute diastolic (congestive) heart failure: Secondary | ICD-10-CM

## 2011-05-31 DIAGNOSIS — F17200 Nicotine dependence, unspecified, uncomplicated: Secondary | ICD-10-CM | POA: Diagnosis present

## 2011-05-31 DIAGNOSIS — I7092 Chronic total occlusion of artery of the extremities: Secondary | ICD-10-CM | POA: Diagnosis present

## 2011-05-31 DIAGNOSIS — E872 Acidosis, unspecified: Secondary | ICD-10-CM | POA: Diagnosis present

## 2011-05-31 DIAGNOSIS — I129 Hypertensive chronic kidney disease with stage 1 through stage 4 chronic kidney disease, or unspecified chronic kidney disease: Secondary | ICD-10-CM | POA: Diagnosis present

## 2011-05-31 DIAGNOSIS — N183 Chronic kidney disease, stage 3 unspecified: Secondary | ICD-10-CM | POA: Diagnosis present

## 2011-05-31 DIAGNOSIS — N185 Chronic kidney disease, stage 5: Secondary | ICD-10-CM | POA: Diagnosis present

## 2011-05-31 DIAGNOSIS — I509 Heart failure, unspecified: Secondary | ICD-10-CM | POA: Diagnosis present

## 2011-05-31 DIAGNOSIS — E871 Hypo-osmolality and hyponatremia: Secondary | ICD-10-CM | POA: Diagnosis present

## 2011-05-31 DIAGNOSIS — N390 Urinary tract infection, site not specified: Secondary | ICD-10-CM | POA: Diagnosis not present

## 2011-05-31 DIAGNOSIS — I70269 Atherosclerosis of native arteries of extremities with gangrene, unspecified extremity: Principal | ICD-10-CM | POA: Diagnosis present

## 2011-05-31 DIAGNOSIS — I4891 Unspecified atrial fibrillation: Secondary | ICD-10-CM

## 2011-05-31 DIAGNOSIS — R112 Nausea with vomiting, unspecified: Secondary | ICD-10-CM | POA: Diagnosis present

## 2011-05-31 DIAGNOSIS — I739 Peripheral vascular disease, unspecified: Secondary | ICD-10-CM | POA: Diagnosis present

## 2011-05-31 DIAGNOSIS — I214 Non-ST elevation (NSTEMI) myocardial infarction: Secondary | ICD-10-CM

## 2011-05-31 DIAGNOSIS — K439 Ventral hernia without obstruction or gangrene: Secondary | ICD-10-CM | POA: Diagnosis present

## 2011-05-31 DIAGNOSIS — L03039 Cellulitis of unspecified toe: Secondary | ICD-10-CM | POA: Diagnosis present

## 2011-05-31 DIAGNOSIS — D72829 Elevated white blood cell count, unspecified: Secondary | ICD-10-CM | POA: Diagnosis present

## 2011-05-31 DIAGNOSIS — N189 Chronic kidney disease, unspecified: Secondary | ICD-10-CM | POA: Diagnosis present

## 2011-05-31 DIAGNOSIS — J4489 Other specified chronic obstructive pulmonary disease: Secondary | ICD-10-CM | POA: Diagnosis present

## 2011-05-31 DIAGNOSIS — D649 Anemia, unspecified: Secondary | ICD-10-CM | POA: Diagnosis present

## 2011-05-31 DIAGNOSIS — R739 Hyperglycemia, unspecified: Secondary | ICD-10-CM | POA: Diagnosis present

## 2011-05-31 DIAGNOSIS — J449 Chronic obstructive pulmonary disease, unspecified: Secondary | ICD-10-CM | POA: Diagnosis present

## 2011-05-31 HISTORY — DX: Essential (primary) hypertension: I10

## 2011-05-31 HISTORY — DX: Chronic kidney disease, unspecified: N18.9

## 2011-05-31 LAB — COMPREHENSIVE METABOLIC PANEL
ALT: 7 U/L (ref 0–35)
AST: 20 U/L (ref 0–37)
Albumin: 3.6 g/dL (ref 3.5–5.2)
Alkaline Phosphatase: 115 U/L (ref 39–117)
BUN: 84 mg/dL — ABNORMAL HIGH (ref 6–23)
CO2: 14 mEq/L — ABNORMAL LOW (ref 19–32)
Calcium: 9.3 mg/dL (ref 8.4–10.5)
Chloride: 97 mEq/L (ref 96–112)
Creatinine, Ser: 2.97 mg/dL — ABNORMAL HIGH (ref 0.50–1.10)
GFR calc Af Amer: 18 mL/min — ABNORMAL LOW (ref >90)
GFR calc non Af Amer: 15 mL/min — ABNORMAL LOW (ref >90)
Glucose, Bld: 118 mg/dL — ABNORMAL HIGH (ref 70–99)
Potassium: 4.3 mEq/L (ref 3.5–5.1)
Sodium: 128 mEq/L — ABNORMAL LOW (ref 135–145)
Total Bilirubin: 0.1 mg/dL — ABNORMAL LOW (ref 0.3–1.2)
Total Protein: 7.8 g/dL (ref 6.0–8.3)

## 2011-05-31 LAB — DIFFERENTIAL
Basophils Absolute: 0.1 10*3/uL (ref 0.0–0.1)
Basophils Relative: 0 % (ref 0–1)
Eosinophils Absolute: 0.1 10*3/uL (ref 0.0–0.7)
Eosinophils Relative: 1 % (ref 0–5)
Lymphocytes Relative: 8 % — ABNORMAL LOW (ref 12–46)
Lymphs Abs: 1.3 10*3/uL (ref 0.7–4.0)
Monocytes Absolute: 1.3 10*3/uL — ABNORMAL HIGH (ref 0.1–1.0)
Monocytes Relative: 8 % (ref 3–12)
Neutro Abs: 13.3 10*3/uL — ABNORMAL HIGH (ref 1.7–7.7)
Neutrophils Relative %: 83 % — ABNORMAL HIGH (ref 43–77)

## 2011-05-31 LAB — URINE MICROSCOPIC-ADD ON

## 2011-05-31 LAB — CBC
HCT: 25 % — ABNORMAL LOW (ref 36.0–46.0)
Hemoglobin: 8.4 g/dL — ABNORMAL LOW (ref 12.0–15.0)
MCH: 29.2 pg (ref 26.0–34.0)
MCHC: 33.6 g/dL (ref 30.0–36.0)
MCV: 86.8 fL (ref 78.0–100.0)
Platelets: 400 10*3/uL (ref 150–400)
RBC: 2.88 MIL/uL — ABNORMAL LOW (ref 3.87–5.11)
RDW: 13.2 % (ref 11.5–15.5)
WBC: 16 10*3/uL — ABNORMAL HIGH (ref 4.0–10.5)

## 2011-05-31 LAB — URINALYSIS, ROUTINE W REFLEX MICROSCOPIC
Bilirubin Urine: NEGATIVE
Glucose, UA: NEGATIVE mg/dL
Hgb urine dipstick: NEGATIVE
Ketones, ur: NEGATIVE mg/dL
Nitrite: NEGATIVE
Protein, ur: 30 mg/dL — AB
Specific Gravity, Urine: 1.017 (ref 1.005–1.030)
Urobilinogen, UA: 0.2 mg/dL (ref 0.0–1.0)
pH: 5.5 (ref 5.0–8.0)

## 2011-05-31 LAB — PHOSPHORUS: Phosphorus: 4 mg/dL (ref 2.3–4.6)

## 2011-05-31 LAB — SEDIMENTATION RATE: Sed Rate: 68 mm/hr — ABNORMAL HIGH (ref 0–22)

## 2011-05-31 LAB — LACTIC ACID, PLASMA: Lactic Acid, Venous: 0.9 mmol/L (ref 0.5–2.2)

## 2011-05-31 LAB — LIPASE, BLOOD: Lipase: 72 U/L — ABNORMAL HIGH (ref 11–59)

## 2011-05-31 IMAGING — CR DG FOOT COMPLETE 3+V*L*
3 series · 3 of 3 positions shown · non-contrast
Comparison: None

CLINICAL DATA: Wound at left great toe

LEFT FOOT - COMPLETE 3+ VIEW

[x foot ap right]
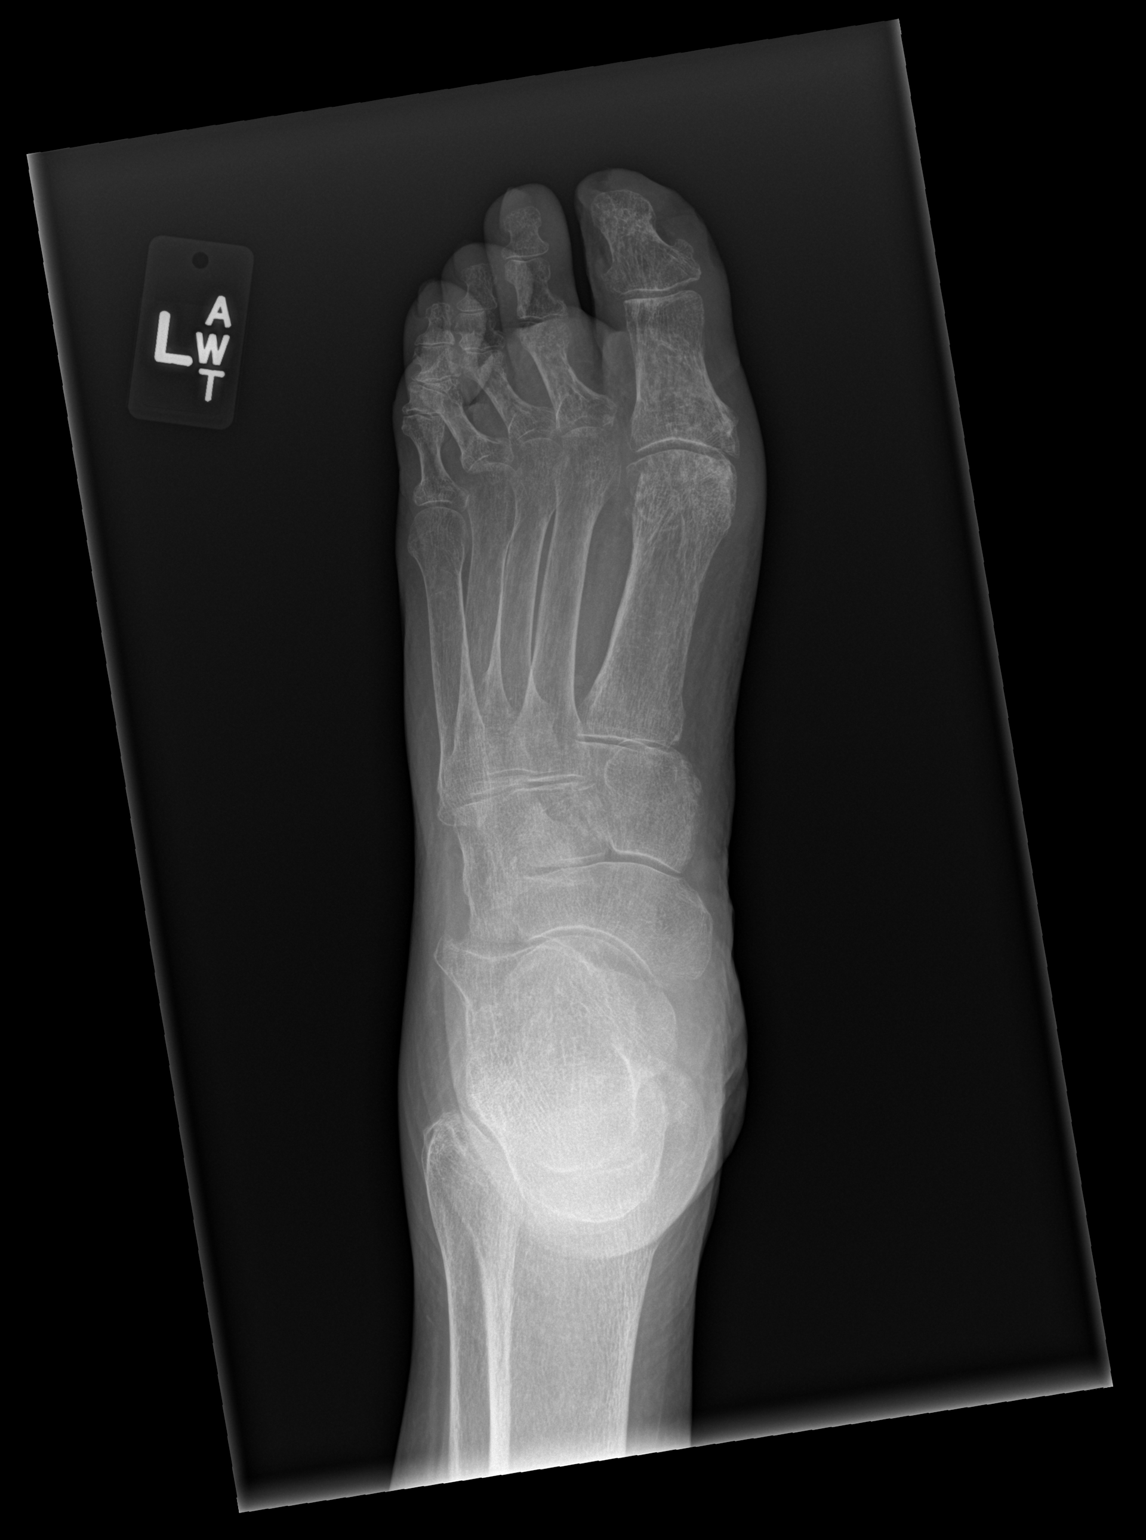

[x foot obl right]
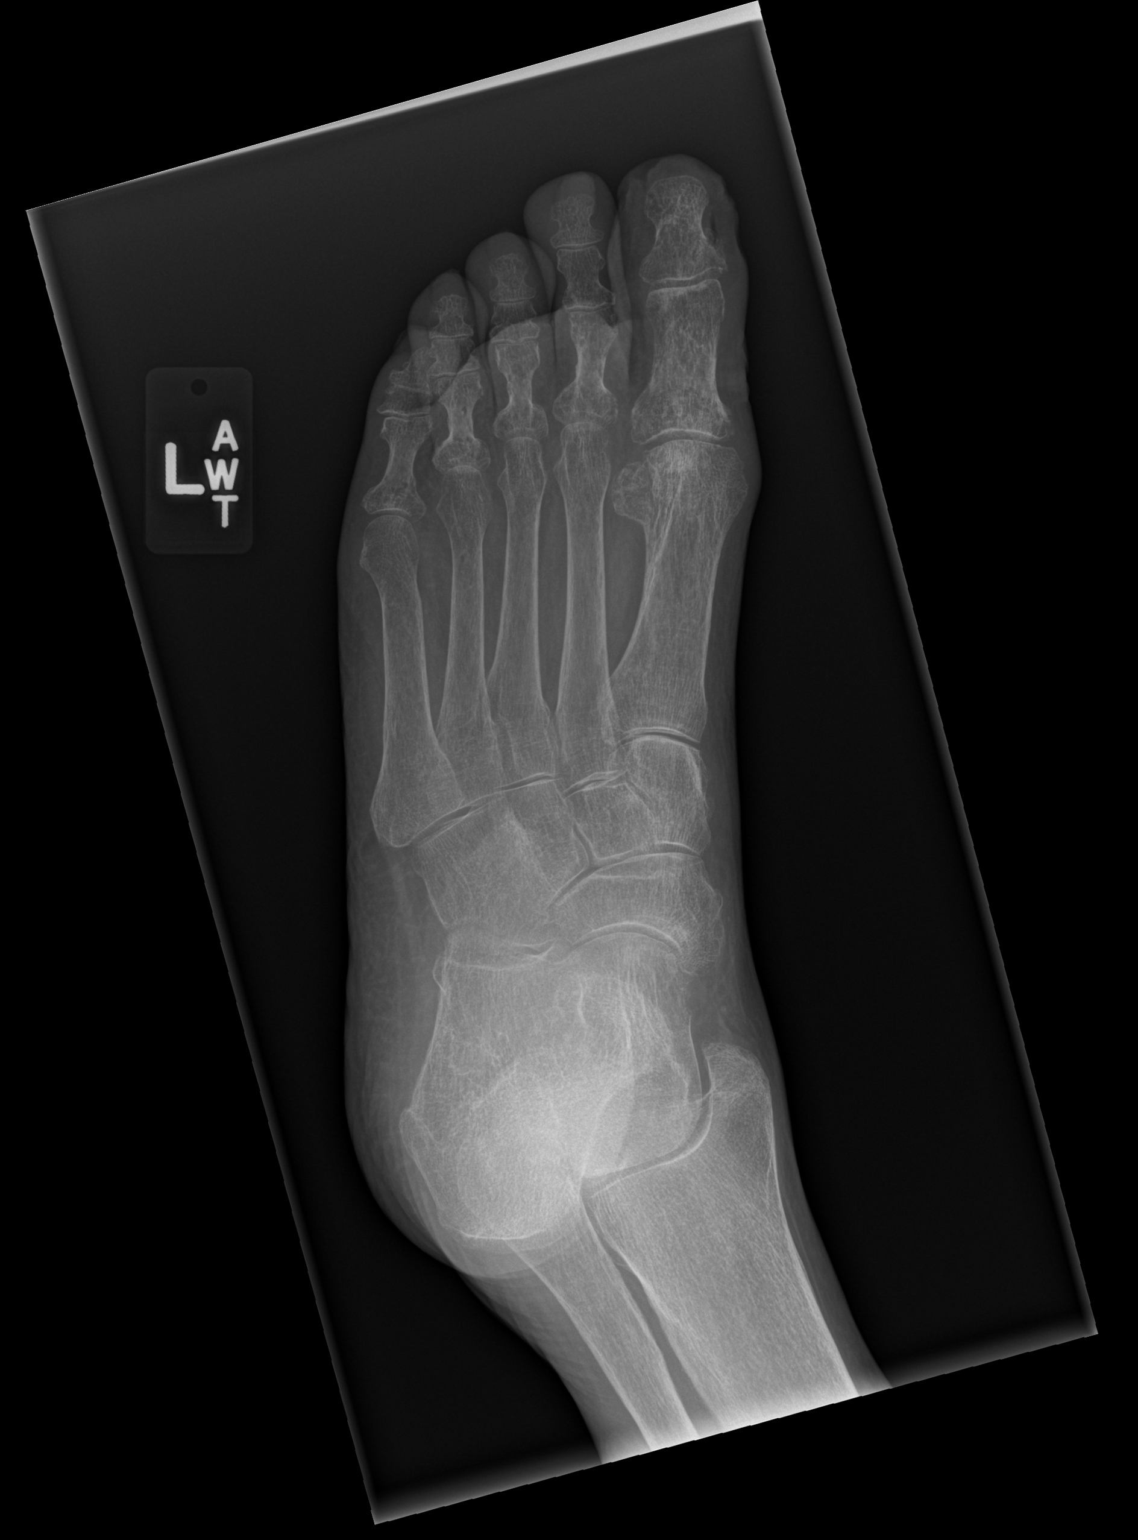

[x foot lat right]
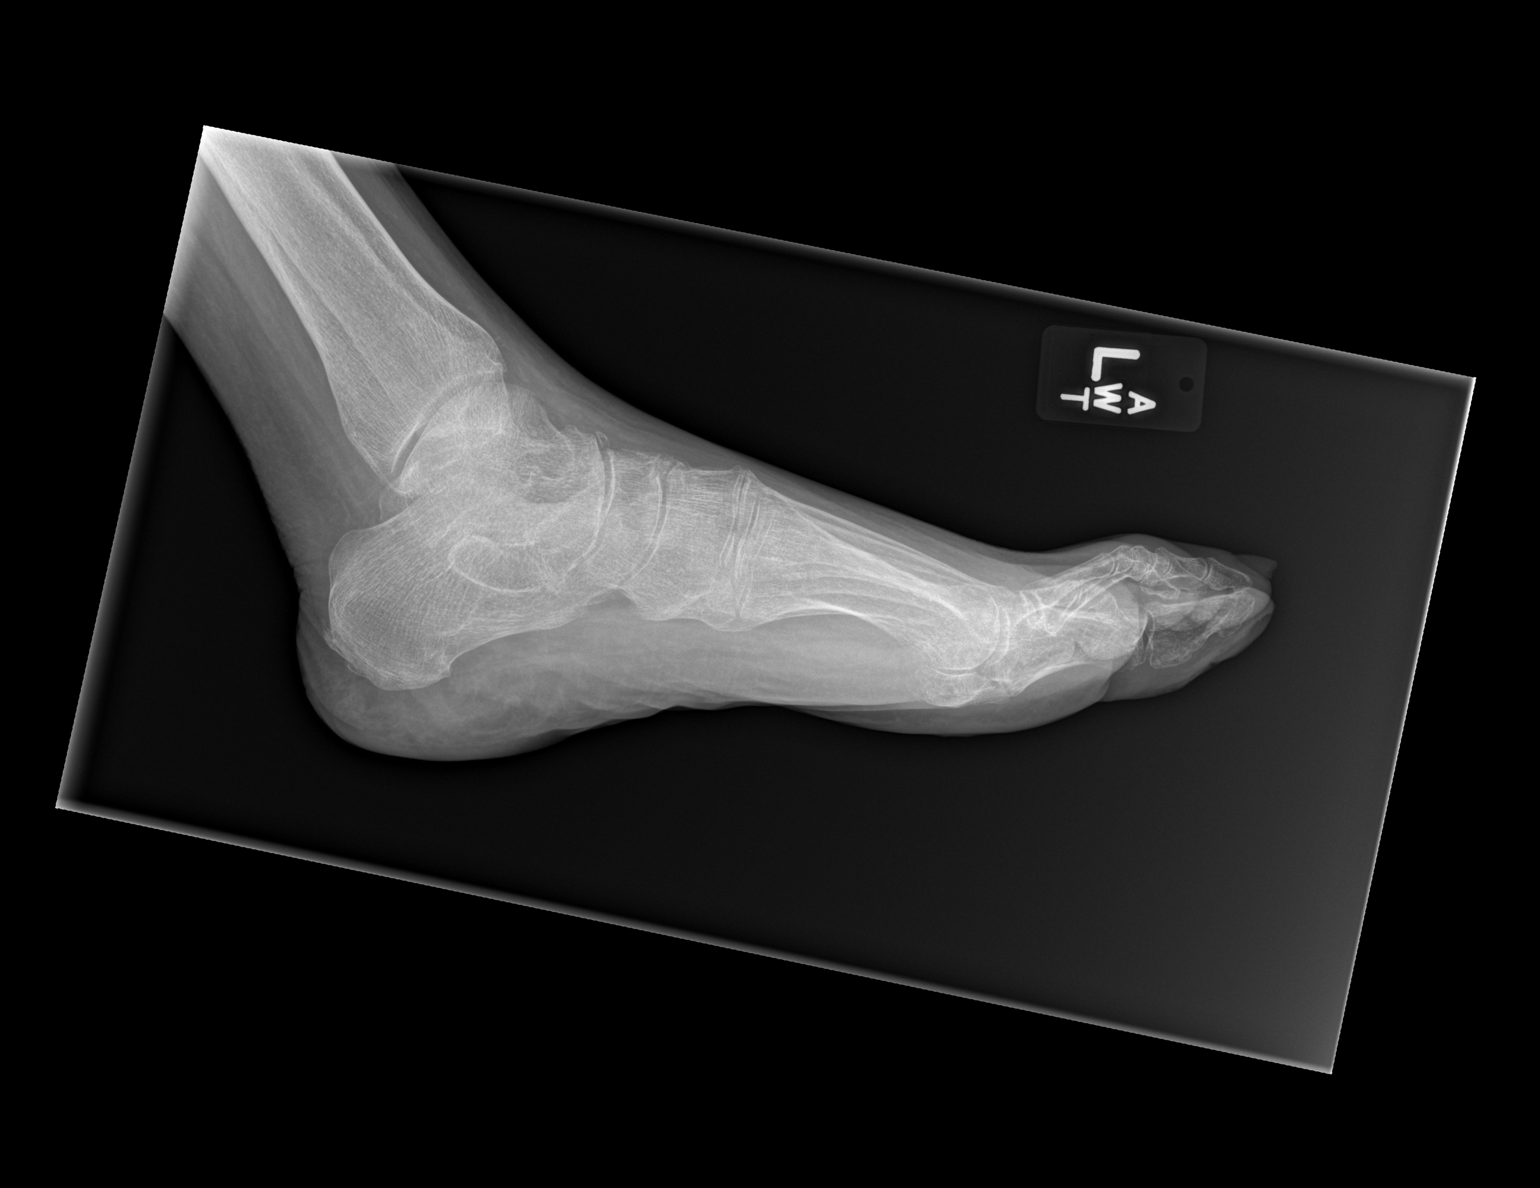

[3 of 3 positions shown; findings below may reference images not displayed]

FINDINGS: Osseous demineralization.
Joint spaces preserved.
Soft tissue irregularity and distal phalanx.
Question soft tissue gas at medial aspect of distal phalanx great
toe.
No definite acute fracture, dislocation or bone destruction.
IMPRESSION: No acute osseous abnormalities.
Diffuse osseous demineralization.
Question soft tissue gas at medial aspect of distal phalanx.

## 2011-05-31 IMAGING — CT CT ABD-PELV W/O CM
1 of 2 series · 15 of 32 positions shown, 19 images · non-contrast
Comparison: [DATE]

CLINICAL DATA: Pain with nausea and vomiting.

CT ABDOMEN AND PELVIS WITHOUT CONTRAST
TECHNIQUE: Multidetector CT imaging of the abdomen and pelvis was
performed following the standard protocol without intravenous
contrast.

[Series 2: abd/pel w/o · axial · non-contrast · 0.84mm/px · z∈[+1356,+1782]mm · 15 of 93 slices shown, 19 images]
[im 4/93  soft-tissue]
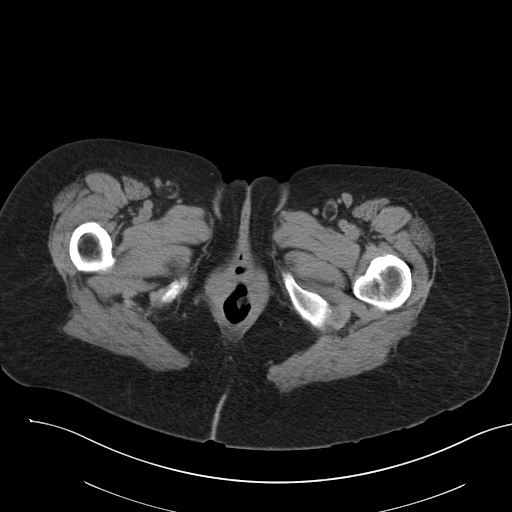
[im 4/93  bone]
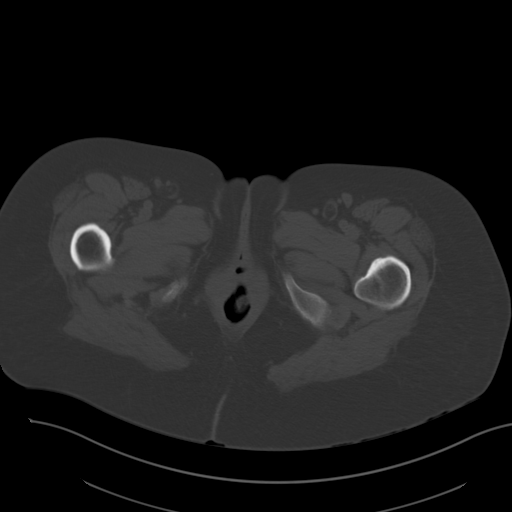
[im 12/93  soft-tissue]
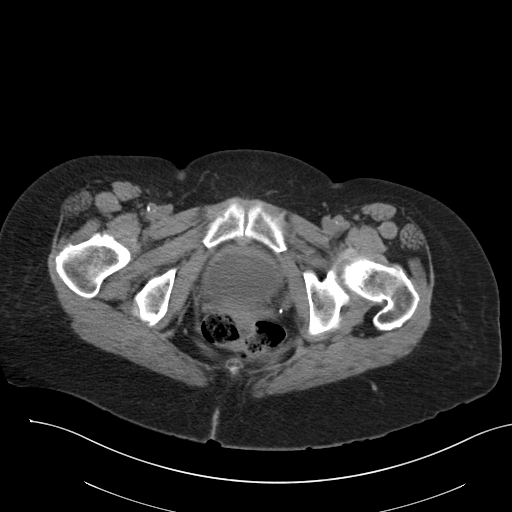
[im 20/93  soft-tissue]
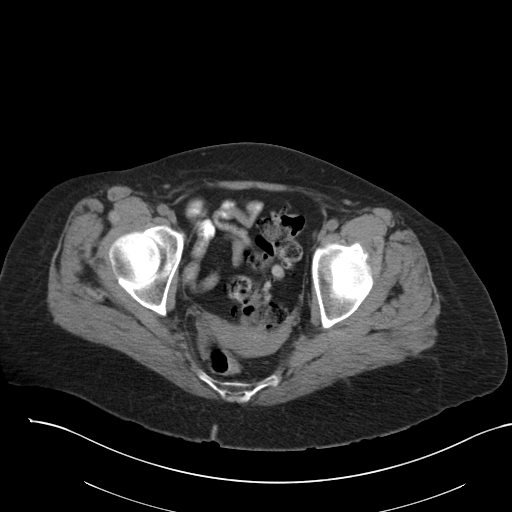
[im 27/93  soft-tissue]
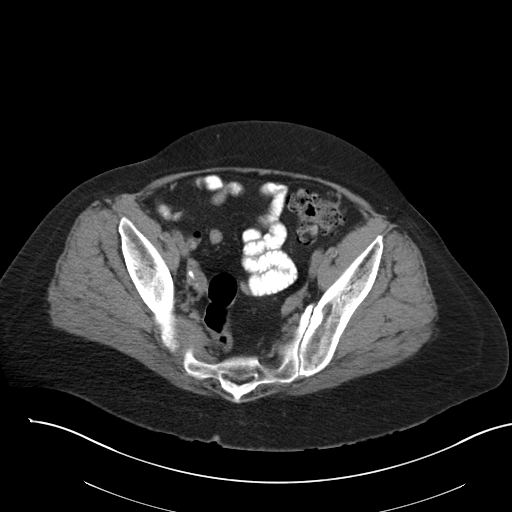
[im 31/93  soft-tissue]
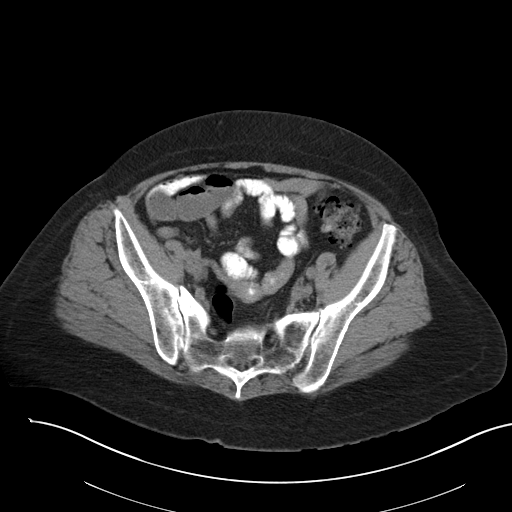
[im 39/93  soft-tissue]
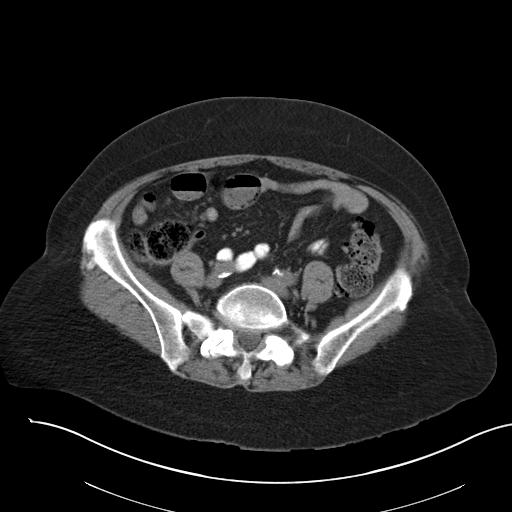
[im 47/93  soft-tissue]
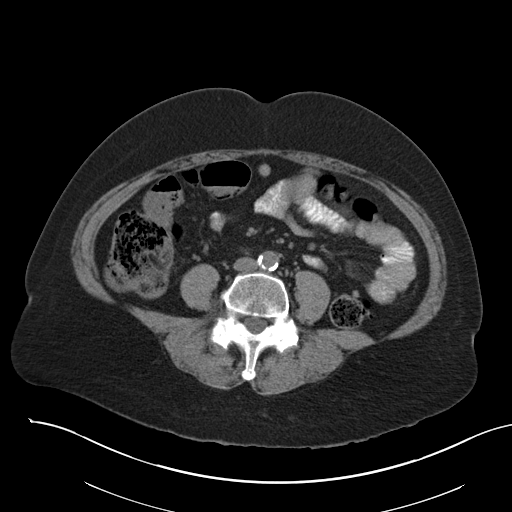
[im 54/93  soft-tissue]
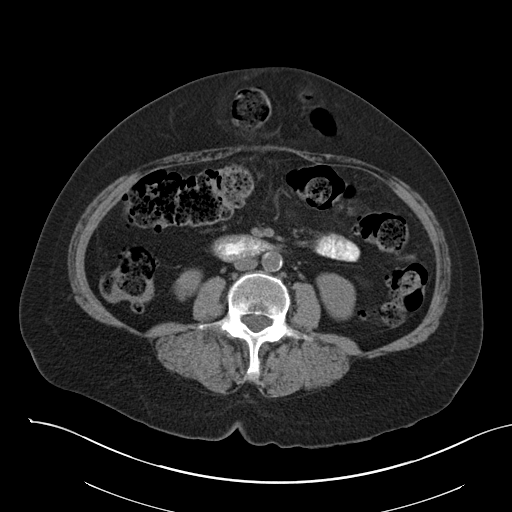
[im 62/93  soft-tissue]
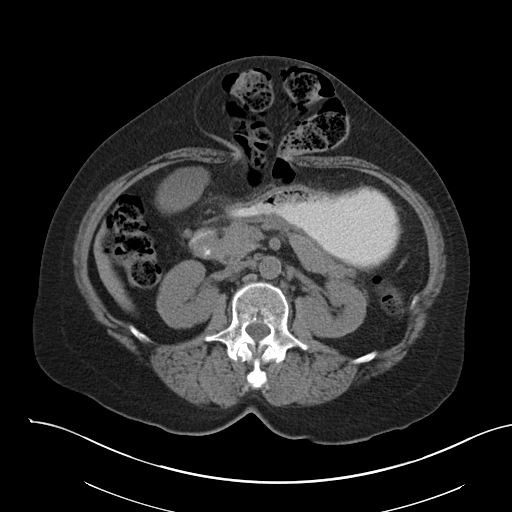
[im 62/93  bone]
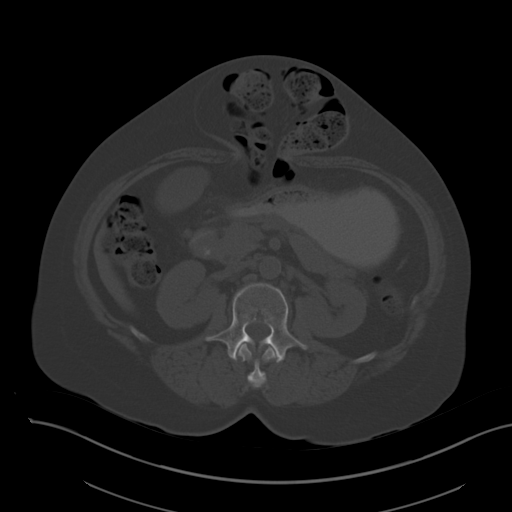
[im 66/93  soft-tissue]
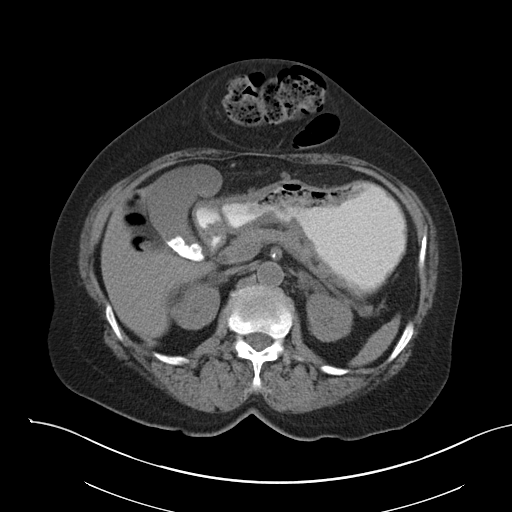
[im 73/93  soft-tissue]
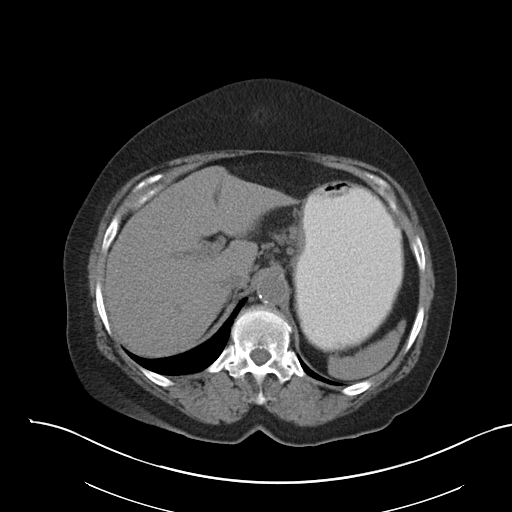
[im 77/93  lung]
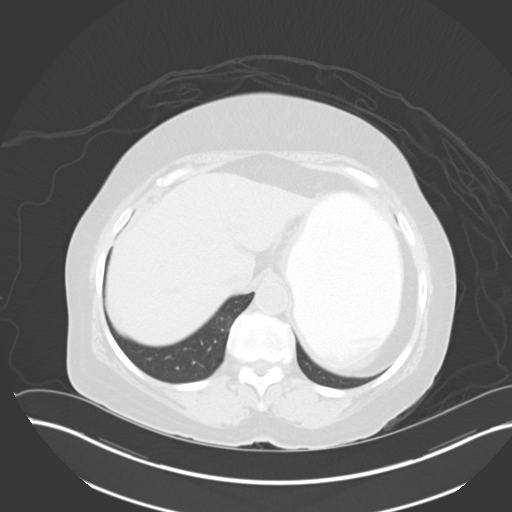
[im 81/93  soft-tissue]
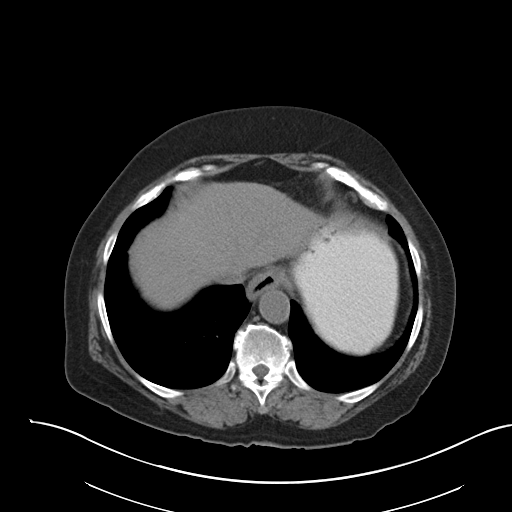
[im 81/93  lung]
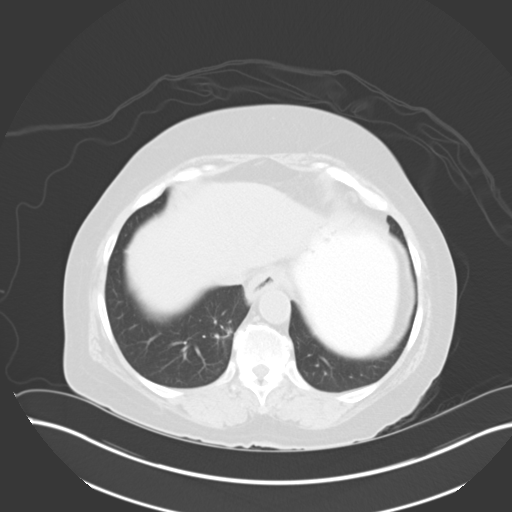
[im 85/93  lung]
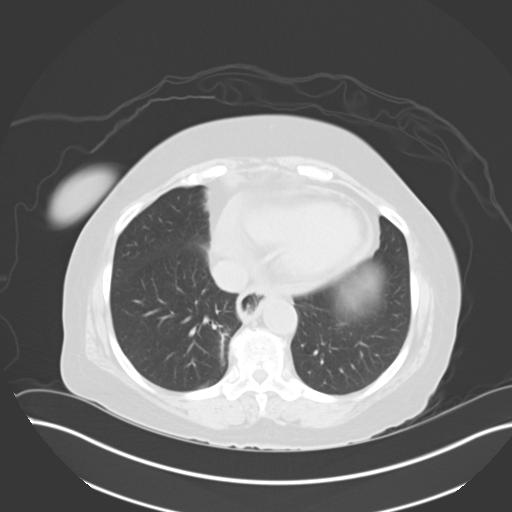
[im 89/93  soft-tissue]
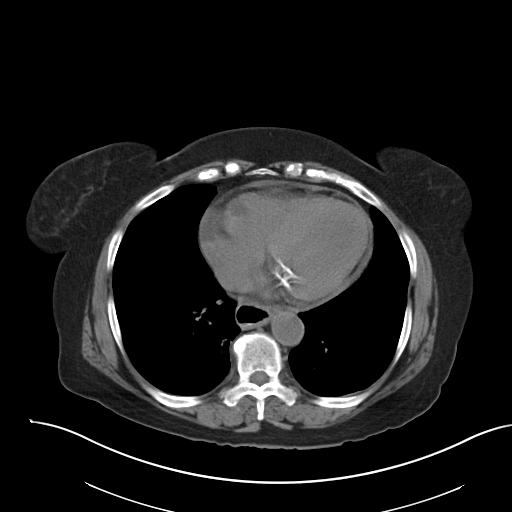
[im 89/93  lung]
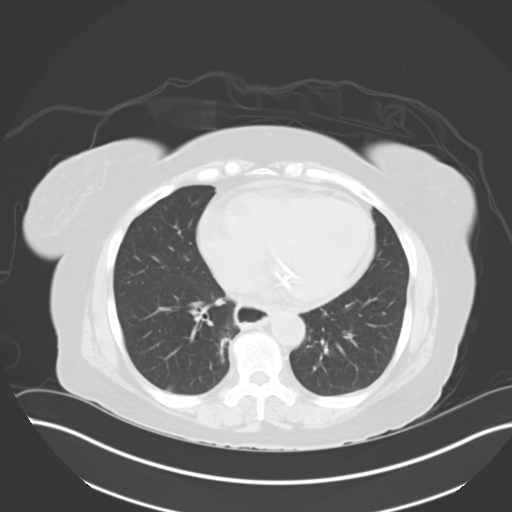

[15 of 32 positions shown; findings below may reference images not displayed]

FINDINGS: There are multiple gallstones.  Gallbladder wall is not
thickened.  No dilated bile ducts.

Liver, spleen, pancreas, and kidneys are normal.  Bilateral benign
adrenal hyperplasia, stable.

Midline abdominal hernia containing a portion of the transverse
colon without evidence of obstruction and without change.

The numerous diverticula in the distal descending and sigmoid
portion of the colon without diverticulitis.  Terminal ileum and
appendix are normal.  Uterus and ovaries are normal.  Small amount
of nonspecific free fluid in the pelvic cul-de-sac.  No significant
osseous abnormality.
IMPRESSION: No acute abnormalities of the abdomen or pelvis.  Cholelithiasis.
Benign adrenal hyperplasia.  Anterior abdominal wall hernia
containing colon.  Diverticulosis.

## 2011-05-31 MED ORDER — SODIUM CHLORIDE 0.9 % IV BOLUS (SEPSIS)
1000.0000 mL | Freq: Once | INTRAVENOUS | Status: AC
  Administered 2011-05-31: 1000 mL via INTRAVENOUS

## 2011-05-31 MED ORDER — PANTOPRAZOLE SODIUM 40 MG IV SOLR
40.0000 mg | Freq: Once | INTRAVENOUS | Status: AC
  Administered 2011-05-31: 40 mg via INTRAVENOUS
  Filled 2011-05-31: qty 40

## 2011-05-31 MED ORDER — SODIUM CHLORIDE 0.9 % IV SOLN
INTRAVENOUS | Status: AC
  Administered 2011-05-31: 16:00:00 via INTRAVENOUS

## 2011-05-31 MED ORDER — MORPHINE SULFATE 2 MG/ML IJ SOLN
2.0000 mg | INTRAMUSCULAR | Status: DC | PRN
  Administered 2011-05-31 – 2011-06-06 (×15): 2 mg via INTRAVENOUS
  Filled 2011-05-31 (×15): qty 1

## 2011-05-31 MED ORDER — AMLODIPINE BESYLATE 10 MG PO TABS
10.0000 mg | ORAL_TABLET | Freq: Every day | ORAL | Status: DC
  Administered 2011-05-31 – 2011-06-15 (×14): 10 mg via ORAL
  Filled 2011-05-31 (×18): qty 1

## 2011-05-31 MED ORDER — PIPERACILLIN-TAZOBACTAM 3.375 G IVPB 30 MIN
3.3750 g | Freq: Once | INTRAVENOUS | Status: AC
  Administered 2011-05-31: 3.375 g via INTRAVENOUS
  Filled 2011-05-31: qty 50

## 2011-05-31 MED ORDER — SODIUM CHLORIDE 0.9 % IV SOLN: Freq: Once | INTRAVENOUS | Status: DC

## 2011-05-31 MED ORDER — VANCOMYCIN HCL IN DEXTROSE 1-5 GM/200ML-% IV SOLN
1000.0000 mg | Freq: Once | INTRAVENOUS | Status: AC
  Administered 2011-05-31: 1000 mg via INTRAVENOUS
  Filled 2011-05-31: qty 200

## 2011-05-31 MED ORDER — ONDANSETRON HCL 4 MG/2ML IJ SOLN
4.0000 mg | Freq: Four times a day (QID) | INTRAMUSCULAR | Status: DC | PRN
  Administered 2011-06-05: 4 mg via INTRAVENOUS
  Filled 2011-05-31: qty 2

## 2011-05-31 MED ORDER — POLYETHYLENE GLYCOL 3350 17 G PO PACK
17.0000 g | PACK | Freq: Every day | ORAL | Status: DC | PRN
  Filled 2011-05-31: qty 1

## 2011-05-31 MED ORDER — ONDANSETRON HCL 4 MG/2ML IJ SOLN
4.0000 mg | Freq: Once | INTRAMUSCULAR | Status: AC
  Administered 2011-05-31: 4 mg via INTRAVENOUS
  Filled 2011-05-31: qty 2

## 2011-05-31 MED ORDER — HYDRALAZINE HCL 20 MG/ML IJ SOLN
10.0000 mg | Freq: Four times a day (QID) | INTRAMUSCULAR | Status: DC | PRN
  Administered 2011-06-04: 10 mg via INTRAVENOUS
  Filled 2011-05-31: qty 0.5

## 2011-05-31 MED ORDER — ONDANSETRON HCL 4 MG PO TABS: 4.0000 mg | ORAL_TABLET | Freq: Four times a day (QID) | ORAL | Status: DC | PRN

## 2011-05-31 MED ORDER — PIPERACILLIN-TAZOBACTAM 3.375 G IVPB
3.3750 g | Freq: Three times a day (TID) | INTRAVENOUS | Status: DC
  Administered 2011-06-01 – 2011-06-06 (×14): 3.375 g via INTRAVENOUS
  Filled 2011-05-31 (×23): qty 50

## 2011-05-31 MED ORDER — ENOXAPARIN SODIUM 30 MG/0.3ML ~~LOC~~ SOLN
30.0000 mg | SUBCUTANEOUS | Status: DC
  Administered 2011-05-31 – 2011-06-01 (×2): 30 mg via SUBCUTANEOUS
  Filled 2011-05-31 (×3): qty 0.3

## 2011-05-31 MED ORDER — VANCOMYCIN HCL 1000 MG IV SOLR
750.0000 mg | INTRAVENOUS | Status: DC
  Administered 2011-06-01 – 2011-06-02 (×2): 750 mg via INTRAVENOUS
  Filled 2011-05-31 (×4): qty 750

## 2011-05-31 MED ORDER — ACETAMINOPHEN 650 MG RE SUPP: 650.0000 mg | Freq: Four times a day (QID) | RECTAL | Status: DC | PRN

## 2011-05-31 MED ORDER — PNEUMOCOCCAL VAC POLYVALENT 25 MCG/0.5ML IJ INJ
0.5000 mL | INJECTION | INTRAMUSCULAR | Status: AC
  Administered 2011-06-01: 0.5 mL via INTRAMUSCULAR
  Filled 2011-05-31: qty 0.5

## 2011-05-31 MED ORDER — SODIUM CHLORIDE 0.9 % IV SOLN
INTRAVENOUS | Status: AC
  Administered 2011-06-01: 1000 mL via INTRAVENOUS

## 2011-05-31 MED ORDER — NICOTINE 14 MG/24HR TD PT24
14.0000 mg | MEDICATED_PATCH | Freq: Every day | TRANSDERMAL | Status: DC
  Administered 2011-05-31 – 2011-06-05 (×6): 14 mg via TRANSDERMAL
  Filled 2011-05-31 (×7): qty 1

## 2011-05-31 MED ORDER — ACETAMINOPHEN 325 MG PO TABS: 650.0000 mg | ORAL_TABLET | Freq: Four times a day (QID) | ORAL | Status: DC | PRN

## 2011-05-31 MED ORDER — ASPIRIN 81 MG PO TBEC: 81.0000 mg | DELAYED_RELEASE_TABLET | Freq: Every day | ORAL | Status: DC

## 2011-05-31 MED ORDER — OXYCODONE HCL 5 MG PO TABS
5.0000 mg | ORAL_TABLET | ORAL | Status: DC | PRN
  Administered 2011-06-01 – 2011-06-05 (×14): 5 mg via ORAL
  Filled 2011-05-31 (×4): qty 1
  Filled 2011-05-31: qty 2
  Filled 2011-05-31 (×11): qty 1

## 2011-05-31 MED ORDER — MORPHINE SULFATE 4 MG/ML IJ SOLN
4.0000 mg | Freq: Once | INTRAMUSCULAR | Status: AC
  Administered 2011-05-31: 4 mg via INTRAVENOUS
  Filled 2011-05-31: qty 1

## 2011-05-31 MED ORDER — PANTOPRAZOLE SODIUM 40 MG PO TBEC
40.0000 mg | DELAYED_RELEASE_TABLET | Freq: Every day | ORAL | Status: DC
  Administered 2011-06-01 – 2011-06-04 (×4): 40 mg via ORAL
  Filled 2011-05-31 (×6): qty 1

## 2011-05-31 MED ORDER — ASPIRIN EC 81 MG PO TBEC
81.0000 mg | DELAYED_RELEASE_TABLET | Freq: Every day | ORAL | Status: DC
  Administered 2011-05-31 – 2011-06-03 (×4): 81 mg via ORAL
  Filled 2011-05-31 (×6): qty 1

## 2011-05-31 NOTE — H&P (Addendum)
PCP:   None at this moment   Chief Complaint:  Abdominal pain, nausea/vomiting, dark discoloration of left big toe.  HPI: 66 year old female with a past medical history significant for hypertension, chronic kidney disease stage III, abdominal hernia (no incarcerated), recent left big toe infection (status post toenail removal by podiatry and antibiotic use as an outpatient); came into the hospital complaining of abdominal pain, nausea/vomiting and decreased intake for the last 24-40 hours prior to admission. Even the patient denies any fever, she endorses having associated chills with her symptoms. Patient reports approximately 6-7 episodes of vomiting and was having difficulty keeping things down, at this moment she came to the emergency department for further evaluation and treatment. In the ED patient was found with a leukocytosis (in the 17,000 range), creatinine 2.97 with a GFR of 17, elevated lipase, hyponatremia and also left first toe with dry gangrene and gas on x-ray. [facility] has been called to admit the patient for further evaluation and treatment.  Of note she denies any chest pain, hematemesis, shortness of breath, melena/hematochezia, any cough or any other acute complaints. Patient also reports having a normal bowel movement on the day of admission.   Patient is currently not compliant with any of her medications for the last 3 months; and is not followed by any PCP at this time.  Allergies:  No Known Allergies    Past Medical History  Diagnosis Date  . Hypertension   . Hernia     Past Surgical History  Procedure Date  . Tubal ligation     Prior to Admission medications   Medication Sig Start Date End Date Taking? Authorizing Provider  aspirin 81 MG EC tablet Take 81 mg by mouth daily.      Historical Provider, MD  hydrochlorothiazide 25 MG tablet Take 25 mg by mouth daily.      Historical Provider, MD  metoprolol (LOPRESSOR) 25 MG tablet Take 25 mg by mouth 2  (two) times daily.      Historical Provider, MD    Social History: Reports that she has been smoking up to half pack of cigarettes per day (for the last 50 years); according to the patient she is on/off for the last 10 days.  She has never used smokeless tobacco. She reports that she does not drink alcohol or use illicit drugs.   History reviewed. No pertinent family history.  Review of Systems:  Negative except as mentioned on HPI.   Physical Exam: Blood pressure 174/84, pulse 88, temperature 97.7 F (36.5 C), temperature source Oral, resp. rate 15, SpO2 99.00%. Constitutional: AAOX3; She appears well-developed and well-nourished. Complaining of pain on her left big toe. Dry mucous membranes. Head: Normocephalic and atraumatic.  Eyes: EOM are normal. Pupils are equal, round, and reactive to light.  Neck: Normal range of motion. Supple, no thyromegaly  Cardiovascular: RRR, S1 and S2 appreciated, no murmurs, no rubs or gallops. Pulmonary/Chest: Good air movement bilaterally, no crackles, no wheezing, no rhonchi. Abdominal: Soft. Bowel sounds are normal. She exhibits depressible mid abdominal mass (due to hernia) with associated mild tenderness on palpation. There is no rebound and no guarding.  Extremities: Normal range of motion. No edema; L great toe with absent nail. Toe is blackened and appears necrotic. Pedal pulses appreciated on exam but mildly decreased. Neurological: Cranial nerve 2-12 grossly intact, muscle strength 5 out of 5 bilaterally symmetrically, no focal neurologic deficit appreciated; patient was alert, awake and oriented x3 cooperative to examination.   Psychiatric:  She has a normal mood and affect.   Labs on Admission:  Results for orders placed during the hospital encounter of 05/31/11 (from the past 48 hour(s))  LACTIC ACID, PLASMA     Status: Normal   Collection Time   05/31/11 10:49 AM      Component Value Range Comment   Lactic Acid, Venous 0.9  0.5 - 2.2  (mmol/L)   CBC     Status: Abnormal   Collection Time   05/31/11 11:00 AM      Component Value Range Comment   WBC 16.0 (*) 4.0 - 10.5 (K/uL)    RBC 2.88 (*) 3.87 - 5.11 (MIL/uL)    Hemoglobin 8.4 (*) 12.0 - 15.0 (g/dL)    HCT 25.0 (*) 36.0 - 46.0 (%)    MCV 86.8  78.0 - 100.0 (fL)    MCH 29.2  26.0 - 34.0 (pg)    MCHC 33.6  30.0 - 36.0 (g/dL)    RDW 13.2  11.5 - 15.5 (%)    Platelets 400  150 - 400 (K/uL)   DIFFERENTIAL     Status: Abnormal   Collection Time   05/31/11 11:00 AM      Component Value Range Comment   Neutrophils Relative 83 (*) 43 - 77 (%)    Neutro Abs 13.3 (*) 1.7 - 7.7 (K/uL)    Lymphocytes Relative 8 (*) 12 - 46 (%)    Lymphs Abs 1.3  0.7 - 4.0 (K/uL)    Monocytes Relative 8  3 - 12 (%)    Monocytes Absolute 1.3 (*) 0.1 - 1.0 (K/uL)    Eosinophils Relative 1  0 - 5 (%)    Eosinophils Absolute 0.1  0.0 - 0.7 (K/uL)    Basophils Relative 0  0 - 1 (%)    Basophils Absolute 0.1  0.0 - 0.1 (K/uL)   COMPREHENSIVE METABOLIC PANEL     Status: Abnormal   Collection Time   05/31/11 11:00 AM      Component Value Range Comment   Sodium 128 (*) 135 - 145 (mEq/L)    Potassium 4.3  3.5 - 5.1 (mEq/L)    Chloride 97  96 - 112 (mEq/L)    CO2 14 (*) 19 - 32 (mEq/L)    Glucose, Bld 118 (*) 70 - 99 (mg/dL)    BUN 84 (*) 6 - 23 (mg/dL)    Creatinine, Ser 2.97 (*) 0.50 - 1.10 (mg/dL)    Calcium 9.3  8.4 - 10.5 (mg/dL)    Total Protein 7.8  6.0 - 8.3 (g/dL)    Albumin 3.6  3.5 - 5.2 (g/dL)    AST 20  0 - 37 (U/L)    ALT 7  0 - 35 (U/L)    Alkaline Phosphatase 115  39 - 117 (U/L)    Total Bilirubin 0.1 (*) 0.3 - 1.2 (mg/dL)    GFR calc non Af Amer 15 (*) >90 (mL/min)    GFR calc Af Amer 18 (*) >90 (mL/min)   LIPASE, BLOOD     Status: Abnormal   Collection Time   05/31/11 11:00 AM      Component Value Range Comment   Lipase 72 (*) 11 - 59 (U/L)   URINALYSIS, ROUTINE W REFLEX MICROSCOPIC     Status: Abnormal   Collection Time   05/31/11 12:50 PM      Component Value Range  Comment   Color, Urine YELLOW  YELLOW     APPearance CLOUDY (*) CLEAR  Specific Gravity, Urine 1.017  1.005 - 1.030     pH 5.5  5.0 - 8.0     Glucose, UA NEGATIVE  NEGATIVE (mg/dL)    Hgb urine dipstick NEGATIVE  NEGATIVE     Bilirubin Urine NEGATIVE  NEGATIVE     Ketones, ur NEGATIVE  NEGATIVE (mg/dL)    Protein, ur 30 (*) NEGATIVE (mg/dL)    Urobilinogen, UA 0.2  0.0 - 1.0 (mg/dL)    Nitrite NEGATIVE  NEGATIVE     Leukocytes, UA MODERATE (*) NEGATIVE    URINE MICROSCOPIC-ADD ON     Status: Abnormal   Collection Time   05/31/11 12:50 PM      Component Value Range Comment   Squamous Epithelial / LPF FEW (*) RARE     WBC, UA 3-6  <3 (WBC/hpf)    Bacteria, UA MANY (*) RARE     Casts GRANULAR CAST (*) NEGATIVE      Radiological Exams on Admission: Ct Abdomen Pelvis Wo Contrast  05/31/2011  *RADIOLOGY REPORT*  Clinical Data: Pain with nausea and vomiting.  CT ABDOMEN AND PELVIS WITHOUT CONTRAST  Technique:  Multidetector CT imaging of the abdomen and pelvis was performed following the standard protocol without intravenous contrast.  Comparison: 08/30/2008  Findings: There are multiple gallstones.  Gallbladder wall is not thickened.  No dilated bile ducts.  Liver, spleen, pancreas, and kidneys are normal.  Bilateral benign adrenal hyperplasia, stable.  Midline abdominal hernia containing a portion of the transverse colon without evidence of obstruction and without change.  The numerous diverticula in the distal descending and sigmoid portion of the colon without diverticulitis.  Terminal ileum and appendix are normal.  Uterus and ovaries are normal.  Small amount of nonspecific free fluid in the pelvic cul-de-sac.  No significant osseous abnormality.  IMPRESSION: No acute abnormalities of the abdomen or pelvis.  Cholelithiasis. Benign adrenal hyperplasia.  Anterior abdominal wall hernia containing colon.  Diverticulosis.  Original Report Authenticated By: Larey Seat, M.D.   Dg Foot  Complete Left  05/31/2011  *RADIOLOGY REPORT*  Clinical Data: Wound at left great toe  LEFT FOOT - COMPLETE 3+ VIEW  Comparison: None  Findings: Osseous demineralization. Joint spaces preserved. Soft tissue irregularity and distal phalanx. Question soft tissue gas at medial aspect of distal phalanx great toe. No definite acute fracture, dislocation or bone destruction.  IMPRESSION: No acute osseous abnormalities. Diffuse osseous demineralization. Question soft tissue gas at medial aspect of distal phalanx.  Original Report Authenticated By: Burnetta Sabin, M.D.     Assessment/Plan 1-Dry gangrene: With most likely superimposed cellulitis; will admit the patient for IV antibiotics, fluid resuscitation and close followup of the WBCs trend and ESR. Orthopedic service has been consulted in order to evaluate patient's toe and decide if we can manage with conservative treatment to save it versus needs of amputation. Will also check arterial dopplers and calculate ABI to r/o PAD.  2- Leukocytosis: Secondary to problem #1. Will provide IV antibiotics and will follow WBCs trend. Due to the possibility of probably the margination out of mild pancreatitis, will also provide IVF's resucistation  3-Acute on Chronic kidney disease (CKD), (stage III): Secondary to prerenal azotemia most likely due to volume contraction and decreased by mouth intake. We'll provide fluid resuscitation and follow creatinine trend. Patient usually analysis also suggesting possible UTI that if present can also contribute to the patient acute on chronic kidney disease, will follow urine culture; patient has been started on vancomycin and Zosyn for  problem #1 which will cover any UTI as well.  4-Presumed UTI: As mentioned above, patient urinalysis suggesting UTI; will follow culture; she has been started on zosyn.  5-Abdominal pain, Nausea & vomiting: with elevated lipase; concerns for mild pancreatitis; especially after recent use of  bactrim. Will provide fluid resuscitation, bowel rest, pain medications and also antiemetics as needed.   6-ANEMIA-NOS: Will check anemia panel; most likely anemia of chronic disease, given the  Negative signs/symptoms of acute bleeding.  7-Essential hypertension, benign:will start patient on amlodipine 10mg  daily; will avoid and diuretics initially due to acute kidney failure; PRN hydralazine for elevated BP if needed.   8-Hyponatremia: Most likely secondary to dehydration and decreased by mouth intake. We'll provide fluid resuscitation and follow electrolytes with BMET in a.m.  9-Tobacco abuse: A smoking cessation counseling has been provided and nicotine patch has been order.  10- Abdominal hernia: Patient with chronic abdominal hernia that at this point is not incarcerated/a strangulated; will continue monitoring.  11-DVT: lovenox   Time Spent on Admission: 60 minutes  [place] [phone]  05/31/2011, 4:21 PM

## 2011-05-31 NOTE — ED Notes (Signed)
[id number] Expected date:<BR> Expected time:<BR> Means of arrival:<BR> Comments:<BR> Hold ems/

## 2011-05-31 NOTE — ED Notes (Signed)
Blood drawn and sent to lab 1 lavender,1 light green and 2 gold

## 2011-05-31 NOTE — ED Notes (Addendum)
Venous dopplar in process.

## 2011-05-31 NOTE — ED Notes (Signed)
Rainbow drawn 1 blue, 1 lavender, 1 light green, 1 dark green tube

## 2011-05-31 NOTE — ED Notes (Signed)
Patient transported to X-ray 

## 2011-05-31 NOTE — Progress Notes (Signed)
*  PRELIMINARY RESULTS* Vascular Ultrasound Lower Extremity Arterial Duplex has been completed.  Preliminary findings:   Right BP = 172 and the Left BP = 183.  Right ABI= 1.04, normal.  Left ABI= 0.17, severe disease.  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 Occluded posterior tibial artery and dampened monophasic flow of the anterior tibial artery.  Landry Mellow RDMS 05/31/2011, 6:17 PM

## 2011-05-31 NOTE — Progress Notes (Signed)
ANTIBIOTIC CONSULT NOTE - INITIAL  Pharmacy Consult for Vancomycin and Zosyn Indication: Gangrene  No Known Allergies  Patient Measurements:   Patient reported weight 170 pounds, height 5'8"  Vital Signs: Temp: 97.7 F (36.5 C) (02/28 1536) Temp src: Oral (02/28 1536) BP: 174/84 mmHg (02/28 1536) Pulse Rate: 88  (02/28 1536)  Labs:  Basename 05/31/11 1100  WBC 16.0*  HGB 8.4*  PLT 400  LABCREA --  CREATININE 2.97*   CrCl is unknown because there is no height on file for the current visit.  No results found for this basename: VANCOTROUGH:2,VANCOPEAK:2,VANCORANDOM:2,GENTTROUGH:2,GENTPEAK:2,GENTRANDOM:2,TOBRATROUGH:2,TOBRAPEAK:2,TOBRARND:2,AMIKACINPEAK:2,AMIKACINTROU:2,AMIKACIN:2, in the last 72 hours   Microbiology: No results found for this or any previous visit (from the past 720 hour(s)).  Medical History: Past Medical History  Diagnosis Date  . Hypertension   . Hernia     Medications:   Anti-infectives    None      Assessment:  66 yo F admit with c/o abdominal pain, nausea/vomiting, dark discoloration of left big toe  Hx of left big toe infection (s/p toenail removal by podiatry and outpatient antibiotic use)  Hx of CKD, SCr acutely elevated with CrCl ~ 23  Left first toe with dry gangrene and gas on x-ray  Goal of Therapy:  Vancomycin trough level 15-20 mcg/ml  Plan:   Vancomycin loading dose of 1g IV now, then 750mg  IV Q24 hours  Zosyn 3.375g IV Q8H infused over 4hrs  Measure antibiotic drug levels at steady state  Follow up culture results, renal function, and labs as available     Gretta Arab PharmD   Pager [phone] 05/31/2011 5:25 PM

## 2011-05-31 NOTE — ED Notes (Signed)
Pt presenting to ed with c/o abdominal pain with positive nausea and vomiting x 2 days pt denies seeing blood in her emesis but states she vomited brownish colored emesis. Pt is alert and oriented at this time. Pt states abdominal pain started after she had her toenail cutoff x 2 weeks ago.

## 2011-05-31 NOTE — ED Provider Notes (Signed)
History     CSN: [id number]  Arrival date & time 05/31/11  1015   First MD Initiated Contact with Patient 05/31/11 1031      Chief Complaint  Patient presents with  . Abdominal Pain  . Hernia    (Consider location/radiation/quality/duration/timing/severity/associated sxs/prior treatment) The history is provided by the patient and a relative.  66 year old patient with past medical history of hypertension presents with abdominal pain and left great toe pain. She states that she has a history of an abdominal hernia, and was seen here for the same last year. She was instructed to followup with surgery on this, but did not do so. She states that she began to have pain in the area of her hernia last evening, and began to have nausea and vomiting as well. She has been able to have a bowel movement and pass flatus since the pain started. She denies having fever or chills. Her past surgical history is significant for a tubal ligation, which was apparently done through an incision at her umbilicus.  The patient additionally presents with left great toe pain. She states that she saw a podiatrist about 2 weeks ago her to have the toe nail removed. She states that she has had intermittent pain in the toe since that time, which has been worsening. She states that she has not noticed any change in the appearance of the toe, but her daughter at the bedside states that "sometimes she doesn't take care of herself." Per the patient, she has no known history of diabetes or immunocompromise.  Past Medical History  Diagnosis Date  . Hypertension   . Hernia     Past Surgical History  Procedure Date  . Tubal ligation     No family history on file.  History  Substance Use Topics  . Smoking status: Former Research scientist (life sciences)  . Smokeless tobacco: Not on file  . Alcohol Use: No    OB History    Grav Para Term Preterm Abortions TAB SAB Ect Mult Living                  Review of Systems  Constitutional:  Negative for fever, chills, activity change and appetite change.  HENT: Negative.   Respiratory: Negative for chest tightness and shortness of breath.   Cardiovascular: Negative for chest pain, palpitations and leg swelling.  Gastrointestinal: Positive for nausea, vomiting and abdominal pain. Negative for diarrhea and constipation.  Musculoskeletal: Negative for myalgias.  Skin: Positive for color change and wound.  Neurological: Negative for dizziness and weakness.    Allergies  Review of patient's allergies indicates no known allergies.  Home Medications   Current Outpatient Rx  Name Route Sig Dispense Refill  . ASPIRIN 81 MG PO TBEC Oral Take 81 mg by mouth daily.      Marland Kitchen HYDROCHLOROTHIAZIDE 25 MG PO TABS Oral Take 25 mg by mouth daily.      Marland Kitchen METOPROLOL TARTRATE 25 MG PO TABS Oral Take 25 mg by mouth 2 (two) times daily.        BP 174/80  Pulse 105  Temp(Src) 97.4 F (36.3 C) (Oral)  Resp 20  SpO2 100%  Physical Exam  Nursing note and vitals reviewed. Constitutional: She is oriented to person, place, and time. She appears well-developed and well-nourished. No distress.  HENT:  Head: Normocephalic and atraumatic.  Eyes: EOM are normal. Pupils are equal, round, and reactive to light.  Neck: Normal range of motion.  Cardiovascular: Normal rate, regular rhythm  and normal heart sounds.  Exam reveals no gallop and no friction rub.   No murmur heard. Pulmonary/Chest: Effort normal and breath sounds normal. No respiratory distress. She exhibits no tenderness.  Abdominal: Soft. Bowel sounds are normal. She exhibits distension and mass. There is tenderness. There is no rebound and no guarding.         Large mass at the the upper midline which is consistent with a hernia. It is difficult to tell, due to the size, if this is an abdominal wall or umbilicus hernia. The area is tender to palpation. There seems to be palpable bowel in the area and it was not easily reduced.    Musculoskeletal: Normal range of motion.       L great toe with absent nail. Toe is blackened and appears possibly gangrenous. There is no skin discoloration to the foot itself. DP/PT pulses intact.  Neurological: She is alert and oriented to person, place, and time.  Skin: Skin is warm and dry. She is not diaphoretic.  Psychiatric: She has a normal mood and affect.    ED Course  Procedures (including critical care time)  Labs Reviewed  URINALYSIS, ROUTINE W REFLEX MICROSCOPIC - Abnormal; Notable for the following:    APPearance CLOUDY (*)    Protein, ur 30 (*)    Leukocytes, UA MODERATE (*)    All other components within normal limits  CBC - Abnormal; Notable for the following:    WBC 16.0 (*)    RBC 2.88 (*)    Hemoglobin 8.4 (*)    HCT 25.0 (*)    All other components within normal limits  DIFFERENTIAL - Abnormal; Notable for the following:    Neutrophils Relative 83 (*)    Neutro Abs 13.3 (*)    Lymphocytes Relative 8 (*)    Monocytes Absolute 1.3 (*)    All other components within normal limits  COMPREHENSIVE METABOLIC PANEL - Abnormal; Notable for the following:    Sodium 128 (*)    CO2 14 (*)    Glucose, Bld 118 (*)    BUN 84 (*)    Creatinine, Ser 2.97 (*)    Total Bilirubin 0.1 (*)    GFR calc non Af Amer 15 (*)    GFR calc Af Amer 18 (*)    All other components within normal limits  LIPASE, BLOOD - Abnormal; Notable for the following:    Lipase 72 (*)    All other components within normal limits  URINE MICROSCOPIC-ADD ON - Abnormal; Notable for the following:    Squamous Epithelial / LPF FEW (*)    Bacteria, UA MANY (*)    Casts GRANULAR CAST (*)    All other components within normal limits  LACTIC ACID, PLASMA   Ct Abdomen Pelvis Wo Contrast  05/31/2011  *RADIOLOGY REPORT*  Clinical Data: Pain with nausea and vomiting.  CT ABDOMEN AND PELVIS WITHOUT CONTRAST  Technique:  Multidetector CT imaging of the abdomen and pelvis was performed following the  standard protocol without intravenous contrast.  Comparison: 08/30/2008  Findings: There are multiple gallstones.  Gallbladder wall is not thickened.  No dilated bile ducts.  Liver, spleen, pancreas, and kidneys are normal.  Bilateral benign adrenal hyperplasia, stable.  Midline abdominal hernia containing a portion of the transverse colon without evidence of obstruction and without change.  The numerous diverticula in the distal descending and sigmoid portion of the colon without diverticulitis.  Terminal ileum and appendix are normal.  Uterus and ovaries  are normal.  Small amount of nonspecific free fluid in the pelvic cul-de-sac.  No significant osseous abnormality.  IMPRESSION: No acute abnormalities of the abdomen or pelvis.  Cholelithiasis. Benign adrenal hyperplasia.  Anterior abdominal wall hernia containing colon.  Diverticulosis.  Original Report Authenticated By: Larey Seat, M.D.   Dg Foot Complete Left  05/31/2011  *RADIOLOGY REPORT*  Clinical Data: Wound at left great toe  LEFT FOOT - COMPLETE 3+ VIEW  Comparison: None  Findings: Osseous demineralization. Joint spaces preserved. Soft tissue irregularity and distal phalanx. Question soft tissue gas at medial aspect of distal phalanx great toe. No definite acute fracture, dislocation or bone destruction.  IMPRESSION: No acute osseous abnormalities. Diffuse osseous demineralization. Question soft tissue gas at medial aspect of distal phalanx.  Original Report Authenticated By: Burnetta Sabin, M.D.   I personally reviewed the imaging studies.  1. Abdominal pain   2. Necrosis of toe       MDM  A 65 year old patient with 2 days of nausea and vomiting, which has been accompanied by abdominal pain. She additionally complains of pain to her left great toe. Her lab evaluation is significant for a leukocytosis with a mild left shift, hyponatremia, renal insufficiency, and elevation of lipase. There is a questionable UTI. Lactic acid is within  normal limits. The CT of her abdomen shows an incarcerated hernia with one loop of bowel without strangulation. There also gallstones without gallbladder thickening, and diverticulosis. Her foot plain film shows questionable soft tissue gas at the distal phalanx of the large toe, but there is no obvious evidence of osteomyelitis. Given the appearance of the toe, plan to consult hospitalist for admission. D/W pt and family who were agreeable. Case d/w Dr. Vanita Panda.  I talked with Dr. Dyann Kief with [facility], who agrees to admit. Per his request for consult, Dr. Vanita Panda has discussed with Dr. Alvan Dame with orthopaedic surgery who will consult and make recommendations regarding possible gangrene of toe.        Abran Richard, [place] 05/31/11 [phone]

## 2011-06-01 DIAGNOSIS — I70269 Atherosclerosis of native arteries of extremities with gangrene, unspecified extremity: Secondary | ICD-10-CM

## 2011-06-01 LAB — CBC
HCT: 20.3 % — ABNORMAL LOW (ref 36.0–46.0)
HCT: 29 % — ABNORMAL LOW (ref 36.0–46.0)
Hemoglobin: 6.8 g/dL — CL (ref 12.0–15.0)
Hemoglobin: 9.8 g/dL — ABNORMAL LOW (ref 12.0–15.0)
MCH: 29.4 pg (ref 26.0–34.0)
MCH: 29.6 pg (ref 26.0–34.0)
MCHC: 33.5 g/dL (ref 30.0–36.0)
MCHC: 33.8 g/dL (ref 30.0–36.0)
MCV: 87.9 fL (ref 78.0–100.0)
MCV: 87.6 fL (ref 78.0–100.0)
Platelets: 349 10*3/uL (ref 150–400)
Platelets: 347 10*3/uL (ref 150–400)
RBC: 2.31 MIL/uL — ABNORMAL LOW (ref 3.87–5.11)
RBC: 3.31 MIL/uL — ABNORMAL LOW (ref 3.87–5.11)
RDW: 13.7 % (ref 11.5–15.5)
RDW: 14 % (ref 11.5–15.5)
WBC: 13.8 10*3/uL — ABNORMAL HIGH (ref 4.0–10.5)
WBC: 14.3 10*3/uL — ABNORMAL HIGH (ref 4.0–10.5)

## 2011-06-01 LAB — IRON AND TIBC
Iron: 27 ug/dL — ABNORMAL LOW (ref 42–135)
Saturation Ratios: 15 % — ABNORMAL LOW (ref 20–55)
TIBC: 181 ug/dL — ABNORMAL LOW (ref 250–470)
UIBC: 154 ug/dL (ref 125–400)

## 2011-06-01 LAB — BASIC METABOLIC PANEL
BUN: 67 mg/dL — ABNORMAL HIGH (ref 6–23)
CO2: 14 mEq/L — ABNORMAL LOW (ref 19–32)
Calcium: 8.5 mg/dL (ref 8.4–10.5)
Chloride: 106 mEq/L (ref 96–112)
Creatinine, Ser: 2.4 mg/dL — ABNORMAL HIGH (ref 0.50–1.10)
GFR calc Af Amer: 23 mL/min — ABNORMAL LOW (ref >90)
GFR calc non Af Amer: 20 mL/min — ABNORMAL LOW (ref >90)
Glucose, Bld: 86 mg/dL (ref 70–99)
Potassium: 4.4 mEq/L (ref 3.5–5.1)
Sodium: 131 mEq/L — ABNORMAL LOW (ref 135–145)

## 2011-06-01 LAB — PREPARE RBC (CROSSMATCH)

## 2011-06-01 LAB — ABO/RH: ABO/RH(D): B POS

## 2011-06-01 LAB — URINE CULTURE
Colony Count: NO GROWTH
Culture  Setup Time: 201302282136
Culture: NO GROWTH

## 2011-06-01 LAB — FERRITIN: Ferritin: 238 ng/mL (ref 10–291)

## 2011-06-01 LAB — FOLATE: Folate: 7.8 ng/mL

## 2011-06-01 LAB — VITAMIN B12: Vitamin B-12: 393 pg/mL (ref 211–911)

## 2011-06-01 LAB — HEMOGLOBIN A1C
Hgb A1c MFr Bld: 5.5 % (ref <5.7)
Mean Plasma Glucose: 111 mg/dL (ref <117)

## 2011-06-01 LAB — TSH: TSH: 0.866 u[IU]/mL (ref 0.350–4.500)

## 2011-06-01 MED ORDER — SODIUM BICARBONATE 8.4 % IV SOLN
INTRAVENOUS | Status: DC
  Administered 2011-06-01 – 2011-06-02 (×3): via INTRAVENOUS
  Filled 2011-06-01 (×9): qty 50

## 2011-06-01 NOTE — Consult Note (Signed)
Reason for Consult: necrotic left great toe Referring Physician: Maryland Pink, MD  Stephanie Griffith is an 66 y.o. female.  HPI: 66yo female with recent history of left great toe nail removal by Podiatrist.  She presented with nausea vomiting and general failure to thrive.  Admitted by medicine.  No report of diabetes in history.  No pain left great toe.  Past Medical History  Diagnosis Date  . Hypertension   . Hernia     Past Surgical History  Procedure Date  . Tubal ligation     History reviewed. No pertinent family history.  Social History:  reports that she has been smoking.  She has never used smokeless tobacco. She reports that she does not drink alcohol or use illicit drugs.  Allergies: No Known Allergies  Medications:  Scheduled:   . sodium chloride   Intravenous Once  . amLODipine  10 mg Oral Daily  . aspirin EC  81 mg Oral Daily  . enoxaparin  30 mg Subcutaneous Q24H  . nicotine  14 mg Transdermal Daily  . pantoprazole  40 mg Oral Q1200  . piperacillin-tazobactam (ZOSYN)  IV  3.375 g Intravenous Q8H  . pneumococcal 23 valent vaccine  0.5 mL Intramuscular Tomorrow-1000  . vancomycin  750 mg Intravenous Q24H    Results for orders placed during the hospital encounter of 05/31/11 (from the past 24 hour(s))  BASIC METABOLIC PANEL     Status: Abnormal   Collection Time   06/01/11  3:40 AM      Component Value Range   Sodium 131 (*) 135 - 145 (mEq/L)   Potassium 4.4  3.5 - 5.1 (mEq/L)   Chloride 106  96 - 112 (mEq/L)   CO2 14 (*) 19 - 32 (mEq/L)   Glucose, Bld 86  70 - 99 (mg/dL)   BUN 67 (*) 6 - 23 (mg/dL)   Creatinine, Ser 2.40 (*) 0.50 - 1.10 (mg/dL)   Calcium 8.5  8.4 - 10.5 (mg/dL)   GFR calc non Af Amer 20 (*) >90 (mL/min)   GFR calc Af Amer 23 (*) >90 (mL/min)  CBC     Status: Abnormal   Collection Time   06/01/11  3:40 AM      Component Value Range   WBC 13.8 (*) 4.0 - 10.5 (K/uL)   RBC 2.31 (*) 3.87 - 5.11 (MIL/uL)   Hemoglobin 6.8 (*) 12.0 - 15.0 (g/dL)   HCT 20.3 (*) 36.0 - 46.0 (%)   MCV 87.9  78.0 - 100.0 (fL)   MCH 29.4  26.0 - 34.0 (pg)   MCHC 33.5  30.0 - 36.0 (g/dL)   RDW 13.7  11.5 - 15.5 (%)   Platelets 349  150 - 400 (K/uL)  ABO/RH     Status: Normal   Collection Time   06/01/11  6:00 AM      Component Value Range   ABO/RH(D) B POS    PREPARE RBC (CROSSMATCH)     Status: Normal   Collection Time   06/01/11  6:45 AM      Component Value Range   Order Confirmation ORDER PROCESSED BY BLOOD BANK    TYPE AND SCREEN     Status: Normal (Preliminary result)   Collection Time   06/01/11  6:45 AM      Component Value Range   ABO/RH(D) B POS     Antibody Screen NEG     Sample Expiration 06/04/2011     Unit Number [id number]     Blood Component  Type RED CELLS,LR     Unit division 00     Status of Unit ISSUED     Transfusion Status OK TO TRANSFUSE     Crossmatch Result Compatible     Unit Number [id number]     Blood Component Type RED CELLS,LR     Unit division 00     Status of Unit ISSUED     Transfusion Status OK TO TRANSFUSE     Crossmatch Result Compatible    CBC     Status: Abnormal   Collection Time   06/01/11  7:58 PM      Component Value Range   WBC 14.3 (*) 4.0 - 10.5 (K/uL)   RBC 3.31 (*) 3.87 - 5.11 (MIL/uL)   Hemoglobin 9.8 (*) 12.0 - 15.0 (g/dL)   HCT 29.0 (*) 36.0 - 46.0 (%)   MCV 87.6  78.0 - 100.0 (fL)   MCH 29.6  26.0 - 34.0 (pg)   MCHC 33.8  30.0 - 36.0 (g/dL)   RDW 14.0  11.5 - 15.5 (%)   Platelets 347  150 - 400 (K/uL)    X-ray:  Left foot IMPRESSION:  No acute osseous abnormalities.  Diffuse osseous demineralization.  Question soft tissue gas at medial aspect of distal phalanx.   Per medical admission note reviewed  Blood pressure 168/74, pulse 90, temperature 97.8 F (36.6 C), temperature source Oral, resp. rate 18, height 5\' 8"  (1.727 m), weight 77.111 kg (170 lb), SpO2 100.00%.  PE:  Awake alert, cooperative female in no acute distress  General medical exam per admission review.  No obvious  SOB  Left foot exam;  Left great toe with obvious necrosis, with concern for migrating necrosis due to darkening hue of her skin dorsally.  Unable to palpate pulses, DP or PT, otherwise intact sensibility  Assessment/Plan: Dry gangrene left great toe with possible systemic implications.  Concern for vascular etiology based on exam and no past history of diabetes.  Recommended to Dr. Maryland Griffith a consult to vascular surgery for non invasive evaluation of LE vascular status prior consideration of any surgical procedure which at this point is inevitable.  Level of amputation would be based on vascular status, any potential intervention and subsequent results.  Would defer amputation to the vascular surgeons at that point.  Call if any further assistance needed.  Stephanie Griffith D 06/01/2011, 9:15 PM

## 2011-06-01 NOTE — Progress Notes (Signed)
PCP: Shanda Howells, MD, MD  Brief HPI:  66 year old female with a past medical history significant for hypertension, chronic kidney disease stage III, abdominal hernia (no incarcerated), recent left big toe infection (status post toenail removal by podiatry and antibiotic use as an outpatient); came into the hospital complaining of abdominal pain, nausea/vomiting and decreased intake for the 24-40 hours prior to admission. The patient denied any fever but she endorses having associated chills with her symptoms. Patient reports approximately 6-7 episodes of vomiting and was having difficulty keeping things down, at this moment she came to the emergency department for further evaluation and treatment. In the ED patient was found with a leukocytosis (in the 17,000 range), creatinine 2.97 with a GFR of 17, elevated lipase, hyponatremia and also left first toe with dry gangrene and gas on x-ray. Of note she denied any chest pain, hematemesis, shortness of breath, melena/hematochezia, any cough or any other acute complaints. Patient also reports having a normal bowel movement on the day of admission. Patient is currently not compliant with any of her medications for the last 3 months; and is not followed by any PCP at this time.  Consultants: Dr. Alvan Dame (Ortho), Dr. Kellie Simmering (Vascular)  Procedures: None so far  Subjective: Patient with pain in left 1st toe. Nausea has improved. No abdominal pain.  Objective: Vital signs in last 24 hours: Temp:  [97.4 F (36.3 C)-97.7 F (36.5 C)] 97.7 F (36.5 C) (03/01 1430) Pulse Rate:  [87-95] 94  (03/01 1430) Resp:  [15-20] 18  (03/01 1430) BP: (151-191)/(72-94) 169/79 mmHg (03/01 1430) SpO2:  [99 %-100 %] 100 % (03/01 0510) Weight:  [77.111 kg (170 lb)] 77.111 kg (170 lb) (02/28 1740) Weight change:  Last BM Date: 05/31/11  Intake/Output from previous day: 02/28 0701 - 03/01 0700 In: [place] [P.O.:120; I.V.:1650; IV Piggyback:50] Out: 1525  [id number] Intake/Output this shift: Total I/O In: 972.5 [P.O.:360; I.V.:335; Blood:277.5] Out: 400 [Urine:400]  General appearance: alert, cooperative and no distress Head: Normocephalic, without obvious abnormality, atraumatic Resp: clear to auscultation bilaterally Cardio: regular rate and rhythm, S1, S2 normal, no murmur, click, rub or gallop GI: soft, ventral hernia noted, slight tenderness present, no masses, BS present. Extremities: left 1st toe is gangrenous. Tender to palpation Pulses: poor pulses LLE Skin: gangrenous left 1st toe Lymph nodes: Cervical, supraclavicular, and axillary nodes normal. Neurologic: Grossly normal  Lab Results:  Basename 06/01/11 0340 05/31/11 1100  WBC 13.8* 16.0*  HGB 6.8* 8.4*  HCT 20.3* 25.0*  PLT 349 400   BMET  Basename 06/01/11 0340 05/31/11 1100  NA 131* 128*  K 4.4 4.3  CL 106 97  CO2 14* 14*  GLUCOSE 86 118*  BUN 67* 84*  CREATININE 2.40* 2.97*  CALCIUM 8.5 9.3  ALT -- 7    Studies/Results: Ct Abdomen Pelvis Wo Contrast  05/31/2011  *RADIOLOGY REPORT*  Clinical Data: Pain with nausea and vomiting.  CT ABDOMEN AND PELVIS WITHOUT CONTRAST  Technique:  Multidetector CT imaging of the abdomen and pelvis was performed following the standard protocol without intravenous contrast.  Comparison: 08/30/2008  Findings: There are multiple gallstones.  Gallbladder wall is not thickened.  No dilated bile ducts.  Liver, spleen, pancreas, and kidneys are normal.  Bilateral benign adrenal hyperplasia, stable.  Midline abdominal hernia containing a portion of the transverse colon without evidence of obstruction and without change.  The numerous diverticula in the distal descending and sigmoid portion of the colon without diverticulitis.  Terminal ileum and appendix are normal.  Uterus and ovaries are normal.  Small amount of nonspecific free fluid in the pelvic cul-de-sac.  No significant osseous abnormality.  IMPRESSION: No acute abnormalities of  the abdomen or pelvis.  Cholelithiasis. Benign adrenal hyperplasia.  Anterior abdominal wall hernia containing colon.  Diverticulosis.  Original Report Authenticated By: Larey Seat, M.D.   Dg Foot Complete Left  05/31/2011  *RADIOLOGY REPORT*  Clinical Data: Wound at left great toe  LEFT FOOT - COMPLETE 3+ VIEW  Comparison: None  Findings: Osseous demineralization. Joint spaces preserved. Soft tissue irregularity and distal phalanx. Question soft tissue gas at medial aspect of distal phalanx great toe. No definite acute fracture, dislocation or bone destruction.  IMPRESSION: No acute osseous abnormalities. Diffuse osseous demineralization. Question soft tissue gas at medial aspect of distal phalanx.  Original Report Authenticated By: Burnetta Sabin, M.D.    Medications:  Scheduled:   . sodium chloride   Intravenous Once  . sodium chloride   Intravenous STAT  . amLODipine  10 mg Oral Daily  . aspirin EC  81 mg Oral Daily  . enoxaparin  30 mg Subcutaneous Q24H  . nicotine  14 mg Transdermal Daily  . pantoprazole  40 mg Oral Q1200  . pantoprazole (PROTONIX) IV  40 mg Intravenous Once  . piperacillin-tazobactam  3.375 g Intravenous Once  . piperacillin-tazobactam (ZOSYN)  IV  3.375 g Intravenous Q8H  . pneumococcal 23 valent vaccine  0.5 mL Intramuscular Tomorrow-1000  . vancomycin  750 mg Intravenous Q24H  . vancomycin  1,000 mg Intravenous Once  . DISCONTD: aspirin  81 mg Oral Daily    Assessment/Plan:  Principal Problem:  *Dry gangrene Active Problems:  ANEMIA-NOS  Essential hypertension, benign  Chronic kidney disease (CKD), stage III (moderate)  Abdominal pain  Nausea & vomiting  Leukocytosis  Hyponatremia  Hyperglycemia    1-Dry gangrene Left 1st Toe: With most likely superimposed cellulitis. She has severe vascular disease based on the arterial dopplers. Discussed with Dr. Kellie Simmering with Vascular who will evaluate her shortly. Will need arteriogram and possible  intervention. Continue broad spectrum antibiotics for now.  2- Leukocytosis: Secondary to problem #1. Will provide IV antibiotics and will follow WBCs trend.   3-Acute on Chronic kidney disease (CKD), (stage III) with Non AG Metabolic Acidosis: Secondary to prerenal azotemia most likely due to volume contraction. Baseline creatinine not clearly know. Last known labs in our system from 2010. Continue IVF with Bicarb. Some improvement this morning.  4-Presumed UTI: Patient urinalysis suggesting UTI; will follow culture; she has been started on zosyn.   5-Abdominal pain, Nausea & vomiting: Lipase was only mildly elevated which could have been from the nausea and vomiting. She has gallstones which could be the reason. Will evaluate using [place]. Continue clear liquids for now.  6-ANEMIA-NOS: Being transfused. Anemia panel suggests anemia due to chronic disease. No overt bleeding noted. Check FOBT.   7-Essential hypertension, benign: On Amlodipine 10mg  daily. PRN hydralazine for elevated BP.  8-Hyponatremia: Most likely secondary to dehydration and decreased by mouth intake. We'll provide fluid resuscitation and follow electrolytes with BMET in a.m. Improved this morning.  9-Tobacco abuse: A smoking cessation counseling has been provided and nicotine patch has been ordered.   10- Abdominal hernia: Patient with chronic abdominal hernia that at this point is not incarcerated/strangulated; will continue monitoring.   11-DVT Prophylaxis: lovenox     LOS: 1 day   [facility] Pager [phone] 06/01/2011, 2:39 PM

## 2011-06-01 NOTE — ED Provider Notes (Signed)
Medical screening examination/treatment/procedure(s) were conducted as a shared visit with non-physician practitioner(s) and myself.  I personally evaluated the patient during the encounter This elderly F w HTN, no diabetes, p/w two complaints; abdominal pain and left great toe pain / blackness.  On my exam she was in no distress.  Her ventral hernia was soft, non-significantly painful, but irreducible.  The great toe was necrotic, though without proximal erythema and with preserved pedal pulses.  The CT (reviewed by me) was consistent w incarcerated hernia (large bowel).  XR (also reviewed by me) was c/w gangrenous changes in the toe.  She was admitted to the hospitalist service for further E/M.  I discussed the case with orthopedics, who will consult on the patient.  Carmin Muskrat, MD 06/01/11 [phone]

## 2011-06-01 NOTE — Progress Notes (Signed)
CRITICAL VALUE ALERT  Critical value received:  6.8 hgb  Date of notification:  3/1/1  Time of notification: 0500  Critical value read back:yes  Nurse who received alert:  M. Maylani Embree  MD notified (1st page): A. Magick-Devine  Time of first page:  0505  MD notified (2nd page):  Time of second page:  Responding MD:    Time MD responded:  [phone]

## 2011-06-01 NOTE — Progress Notes (Signed)
INITIAL ADULT NUTRITION ASSESSMENT Date: 06/01/2011   Time: 4:09 PM Reason for Assessment: consult  ASSESSMENT: Female 66 y.o.  Dx: Dry gangrene  Hx:  Past Medical History  Diagnosis Date  . Hypertension   . Hernia    Past Surgical History  Procedure Date  . Tubal ligation     Related Meds:  Scheduled Meds:   . sodium chloride   Intravenous Once  . sodium chloride   Intravenous STAT  . amLODipine  10 mg Oral Daily  . aspirin EC  81 mg Oral Daily  . enoxaparin  30 mg Subcutaneous Q24H  . nicotine  14 mg Transdermal Daily  . pantoprazole  40 mg Oral Q1200  . piperacillin-tazobactam  3.375 g Intravenous Once  . piperacillin-tazobactam (ZOSYN)  IV  3.375 g Intravenous Q8H  . pneumococcal 23 valent vaccine  0.5 mL Intramuscular Tomorrow-1000  . vancomycin  750 mg Intravenous Q24H  . vancomycin  1,000 mg Intravenous Once  . DISCONTD: aspirin  81 mg Oral Daily   Continuous Infusions:   . sodium chloride 1,000 mL (06/01/11 0106)  .  sodium bicarbonate infusion 1056ml     PRN Meds:.acetaminophen, acetaminophen, hydrALAZINE, morphine, ondansetron (ZOFRAN) IV, ondansetron, oxyCODONE, polyethylene glycol   Ht: 5\' 8"  (172.7 cm)  Wt: 170 lb (77.111 kg)  Ideal Wt: 63.6 kg % Ideal Wt: 121%  Usual Wt: 81.8 kg % Usual Wt: 94%  Body mass index is 25.85 kg/(m^2).  Food/Nutrition Related Hx: Nausea, vomiting for 2 weeks, unable to eat  Labs:  CMP     Component Value Date/Time   NA 131* 06/01/2011 0340   K 4.4 06/01/2011 0340   CL 106 06/01/2011 0340   CO2 14* 06/01/2011 0340   GLUCOSE 86 06/01/2011 0340   BUN 67* 06/01/2011 0340   CREATININE 2.40* 06/01/2011 0340   CALCIUM 8.5 06/01/2011 0340   PROT 7.8 05/31/2011 1100   ALBUMIN 3.6 05/31/2011 1100   AST 20 05/31/2011 1100   ALT 7 05/31/2011 1100   ALKPHOS 115 05/31/2011 1100   BILITOT 0.1* 05/31/2011 1100   GFRNONAA 20* 06/01/2011 0340   GFRAA 23* 06/01/2011 0340    CBC    Component Value Date/Time   WBC 13.8* 06/01/2011 0340   RBC 2.31* 06/01/2011 0340   HGB 6.8* 06/01/2011 0340   HCT 20.3* 06/01/2011 0340   PLT 349 06/01/2011 0340   MCV 87.9 06/01/2011 0340   MCH 29.4 06/01/2011 0340   MCHC 33.5 06/01/2011 0340   RDW 13.7 06/01/2011 0340   LYMPHSABS 1.3 05/31/2011 1100   MONOABS 1.3* 05/31/2011 1100   EOSABS 0.1 05/31/2011 1100   BASOSABS 0.1 05/31/2011 1100    Intake: sips of clear liquid Output:   Diet Order: Clear Liquid  Supplements/Tube Feeding:  IVF:    sodium chloride Last Rate: 1,000 mL (06/01/11 0106)  sodium bicarbonate infusion 102ml     Estimated Nutritional Needs:   Kcal: 1925-2160 kcal Protein: 77-92g Fluid: 2.3 L  Pt admitted with nausea/vomiting.  Pt reports her usual wt to be 180 lbs.  Reports she has been vomiting for 2 weeks straight, unable to keep foods or liquids down.  Pt states that children attempted to assist in meals but no relief from nausea. Pt has not been re-weighed since rehydration, however given hx suspect actual body wt loss as well.    Pt qualifies for moderate protein-calorie malnutrition due to 5% wt loss in 2 weeks, decreased intake <75% of needs for >1 week.  NUTRITION  DIAGNOSIS: -Inadequate oral intake (NI-2.1).  Status: Ongoing  RELATED TO: nausea, vomiting  AS EVIDENCE BY: pt with 8 lbs wt loss in unknown period of time, 2 weeks of nausea/vomiting  MONITORING/EVALUATION(Goals): 1.  Food/Beverage; diet advancement with tolerance 2.  Wt/wt change; deter further loss at this time  EDUCATION NEEDS: -Education needs addressed  INTERVENTION: 1.  Modify diet; per MD discretion and pt tolerance to heart healthy.  Dietitian #: [phone]  [place] Per approved criteria  -Non-severe (moderate) malnutrition in the context of acute illness or injury    Docia Barrier 06/01/2011, 4:09 PM

## 2011-06-01 NOTE — Consult Note (Signed)
Vascular Surgery Consultation  Reason for Consult: Gangrene left first toe HPI: Stephanie Griffith is a 66 y.o. female who presents for evaluation of gangrene left first toe. The patient states that she had a problem with her toenail which was removed by a local podiatrist 2 weeks ago. She then developed progressive gangrene of the left first toe which became dark and hard. She has been having pain in this area. She does have some left calf claudication by history but does not ambulate long distances. She denies chills and fever. She was admitted for further evaluation. She denies any previous history of vascular occlusive disease in the lower extremities. She does state that she had a heart attack in the past but cardiac cath did not reveal significant blockages.  Lower extremity arterial Dopplers were performed which revealed an ABI of 0.17 in the left anterior tibial artery and 1.0 in the right lower extremity. These were performed at [facility] and I reviewed this study and interpreted it.   Past Medical History  Diagnosis Date  . Hypertension   . Hernia    Past Surgical History  Procedure Date  . Tubal ligation    History   Social History  . Marital Status: Widowed    Spouse Name: N/A    Number of Children: N/A  . Years of Education: N/A   Social History Main Topics  . Smoking status: Current Everyday Smoker -- 2.0 packs/day for 50 years  . Smokeless tobacco: Never Used  . Alcohol Use: No  . Drug Use: No  . Sexually Active:    Other Topics Concern  . None   Social History Narrative  . None   History reviewed. No pertinent family history. No Known Allergies Prior to Admission medications   Medication Sig Start Date End Date Taking? Authorizing Provider  aspirin 81 MG EC tablet Take 81 mg by mouth daily.      Historical Provider, MD  hydrochlorothiazide 25 MG tablet Take 25 mg by mouth daily.      Historical Provider, MD  metoprolol (LOPRESSOR) 25 MG tablet Take 25  mg by mouth 2 (two) times daily.      Historical Provider, MD     Positive ROS: Denies chest pain, dyspnea on exertion, PND, orthopnea, DVT, thrombophlebitis. Does have a history of a large hernia in her abdomen which needs to be repaired she states. No history of abdominal pain.  All other systems have been reviewed and were otherwise negative with the exception of those mentioned in the HPI and as above.  Physical Exam: Filed Vitals:   06/01/11 1545  BP: 149/73  Pulse: 90  Temp: 97.9 F (36.6 C)  Resp: 16    General: Alert, no acute distress HEENT: Normal for age Cardiovascular: Regular rate and rhythm. Carotid pulses 2+, no bruits audible Respiratory: Clear to auscultation. No cyanosis, no use of accessory musculature GI: No organomegaly, abdomen is soft and non-tender Skin: No lesions in the area of chief complaint Neurologic: Sensation intact distally Psychiatric: Patient is competent for consent with normal mood and affect Musculoskeletal: No obvious deformities Extremities: Right leg has 3+ femoral popliteal and 2+ posterior tibial pulse palpable. Left leg has 3+ femoral pulse with no popliteal or distal pulses palpable. There is dry gangrene of the left first toe extending back to the flexion crease. There is darkening of the skin medially back as far as the first metatarsal head. There is no fluctuance or erythema.  Labs reviewed: Creatinine is  2.9 yesterday improved to 2.4 today. Patient also receiving 2 units of packed red blood cells for anemia.  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 Agree with maximization of medical management with transfusion and improvement and creatinine prior to study. Will need strict dye limitation.   Patient will be transferred to [facility] on Sunday, March 3 Plan angiography left lower extremity by Dr. Doren Custard on Monday, March 4 with CO2 and limited contrast to see if patient candidate for revascularization Discussed with patient the likelihood of amputation with significant chance that she will require below-knee or above-knee amputation.   Tinnie Gens, MD 06/01/2011 4:55 PM

## 2011-06-02 ENCOUNTER — Inpatient Hospital Stay (HOSPITAL_COMMUNITY): Payer: Medicare Other

## 2011-06-02 ENCOUNTER — Other Ambulatory Visit: Payer: Self-pay

## 2011-06-02 DIAGNOSIS — N189 Chronic kidney disease, unspecified: Secondary | ICD-10-CM | POA: Diagnosis present

## 2011-06-02 DIAGNOSIS — N179 Acute kidney failure, unspecified: Secondary | ICD-10-CM | POA: Diagnosis present

## 2011-06-02 DIAGNOSIS — R109 Unspecified abdominal pain: Secondary | ICD-10-CM

## 2011-06-02 DIAGNOSIS — I739 Peripheral vascular disease, unspecified: Secondary | ICD-10-CM | POA: Diagnosis present

## 2011-06-02 DIAGNOSIS — K8020 Calculus of gallbladder without cholecystitis without obstruction: Secondary | ICD-10-CM

## 2011-06-02 DIAGNOSIS — K439 Ventral hernia without obstruction or gangrene: Secondary | ICD-10-CM

## 2011-06-02 LAB — COMPREHENSIVE METABOLIC PANEL
ALT: 8 U/L (ref 0–35)
AST: 33 U/L (ref 0–37)
Albumin: 2.8 g/dL — ABNORMAL LOW (ref 3.5–5.2)
Alkaline Phosphatase: 83 U/L (ref 39–117)
BUN: 41 mg/dL — ABNORMAL HIGH (ref 6–23)
CO2: 17 mEq/L — ABNORMAL LOW (ref 19–32)
Calcium: 8.7 mg/dL (ref 8.4–10.5)
Chloride: 107 mEq/L (ref 96–112)
Creatinine, Ser: 1.68 mg/dL — ABNORMAL HIGH (ref 0.50–1.10)
GFR calc Af Amer: 36 mL/min — ABNORMAL LOW (ref >90)
GFR calc non Af Amer: 31 mL/min — ABNORMAL LOW (ref >90)
Glucose, Bld: 114 mg/dL — ABNORMAL HIGH (ref 70–99)
Potassium: 4.3 mEq/L (ref 3.5–5.1)
Sodium: 132 mEq/L — ABNORMAL LOW (ref 135–145)
Total Bilirubin: 0.3 mg/dL (ref 0.3–1.2)
Total Protein: 6.4 g/dL (ref 6.0–8.3)

## 2011-06-02 LAB — CBC
HCT: 28 % — ABNORMAL LOW (ref 36.0–46.0)
Hemoglobin: 9.5 g/dL — ABNORMAL LOW (ref 12.0–15.0)
MCH: 29.5 pg (ref 26.0–34.0)
MCHC: 33.9 g/dL (ref 30.0–36.0)
MCV: 87 fL (ref 78.0–100.0)
Platelets: 314 10*3/uL (ref 150–400)
RBC: 3.22 MIL/uL — ABNORMAL LOW (ref 3.87–5.11)
RDW: 14.2 % (ref 11.5–15.5)
WBC: 14.3 10*3/uL — ABNORMAL HIGH (ref 4.0–10.5)

## 2011-06-02 LAB — TYPE AND SCREEN
ABO/RH(D): B POS
Antibody Screen: NEGATIVE
Unit division: 0
Unit division: 0

## 2011-06-02 LAB — LIPASE, BLOOD: Lipase: 48 U/L (ref 11–59)

## 2011-06-02 IMAGING — CR DG CHEST 2V
2 series · 2 of 2 positions shown · non-contrast
Comparison: Chest radiograph [DATE]

CLINICAL DATA: Preop hernia repair

CHEST - 2 VIEW

[w chest pa]
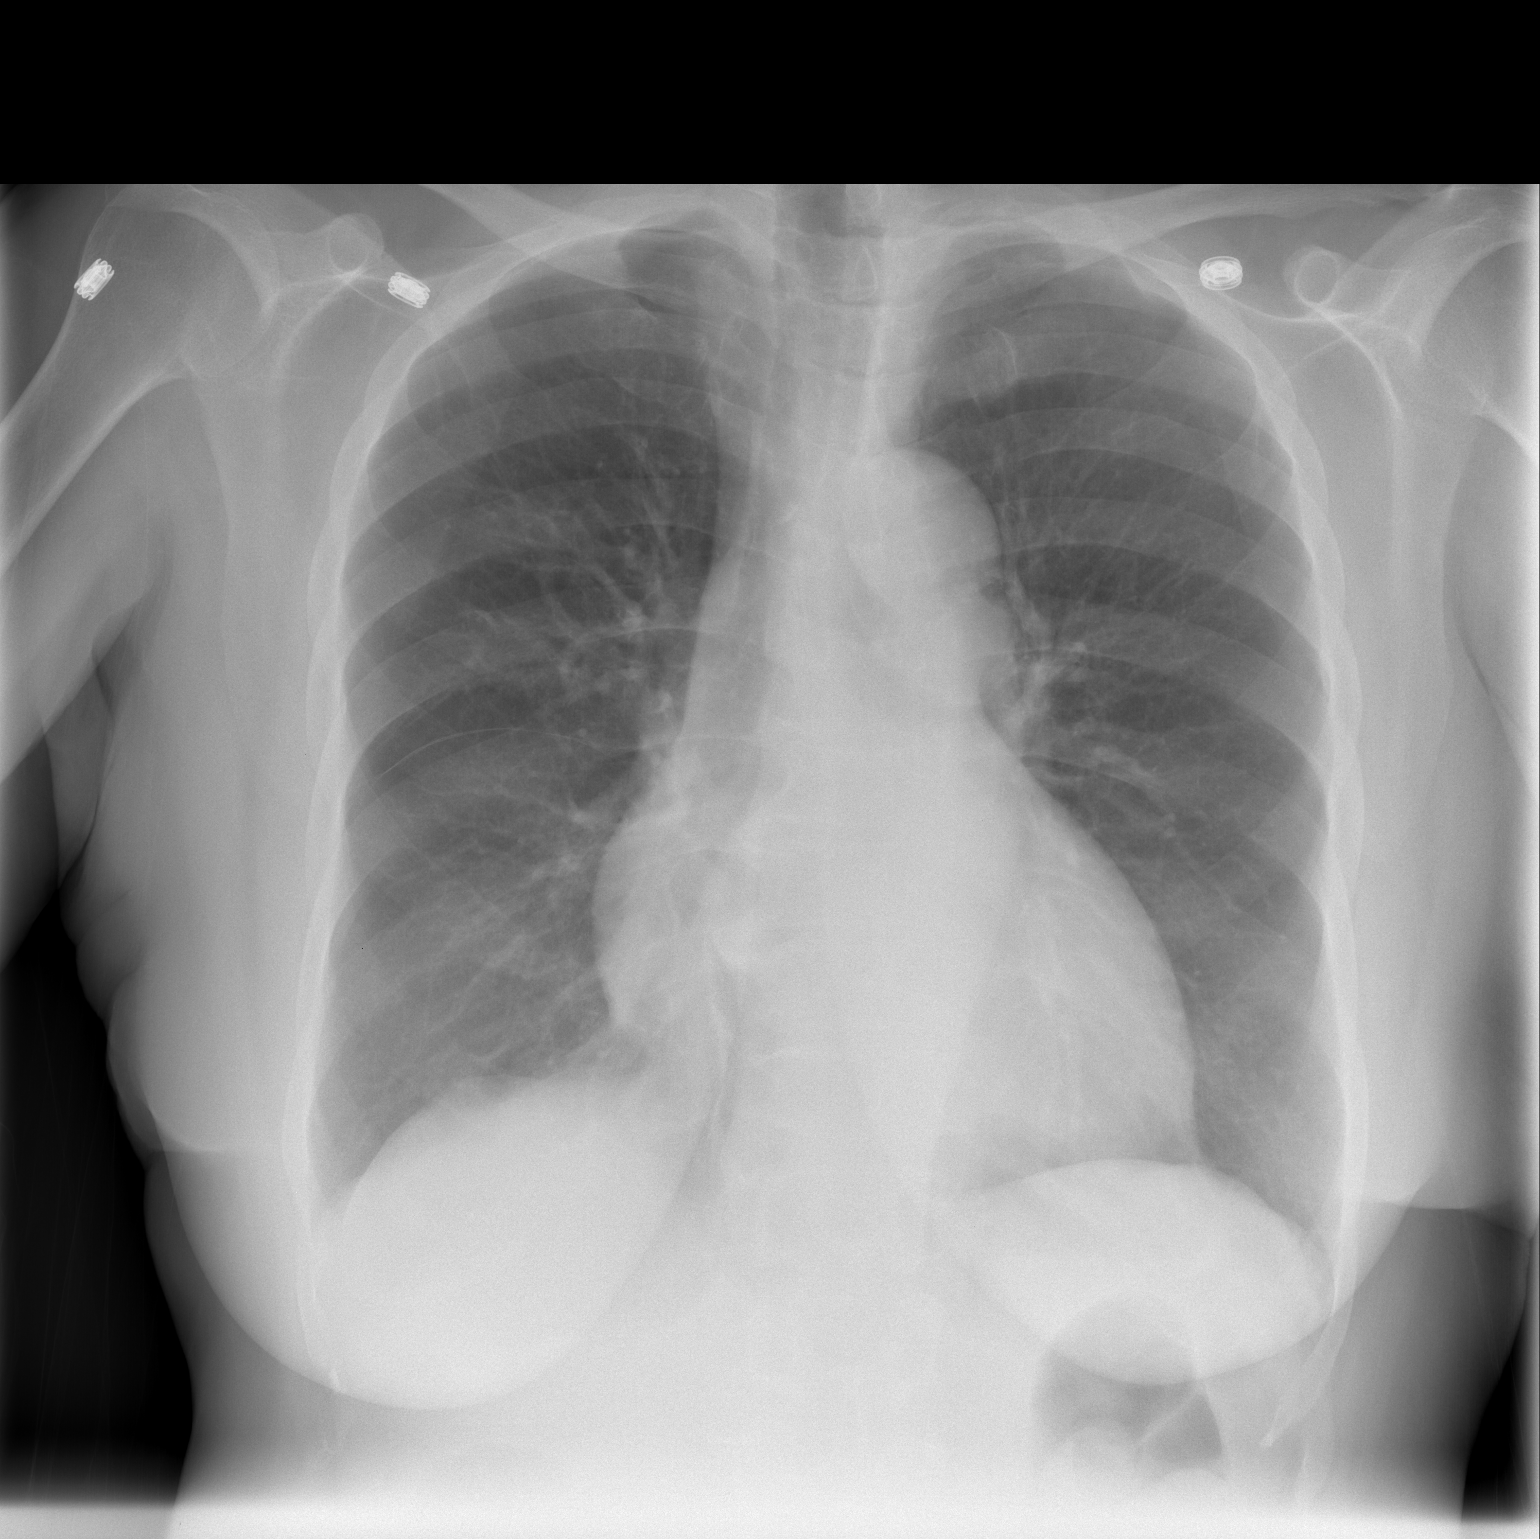

[w chest lat]
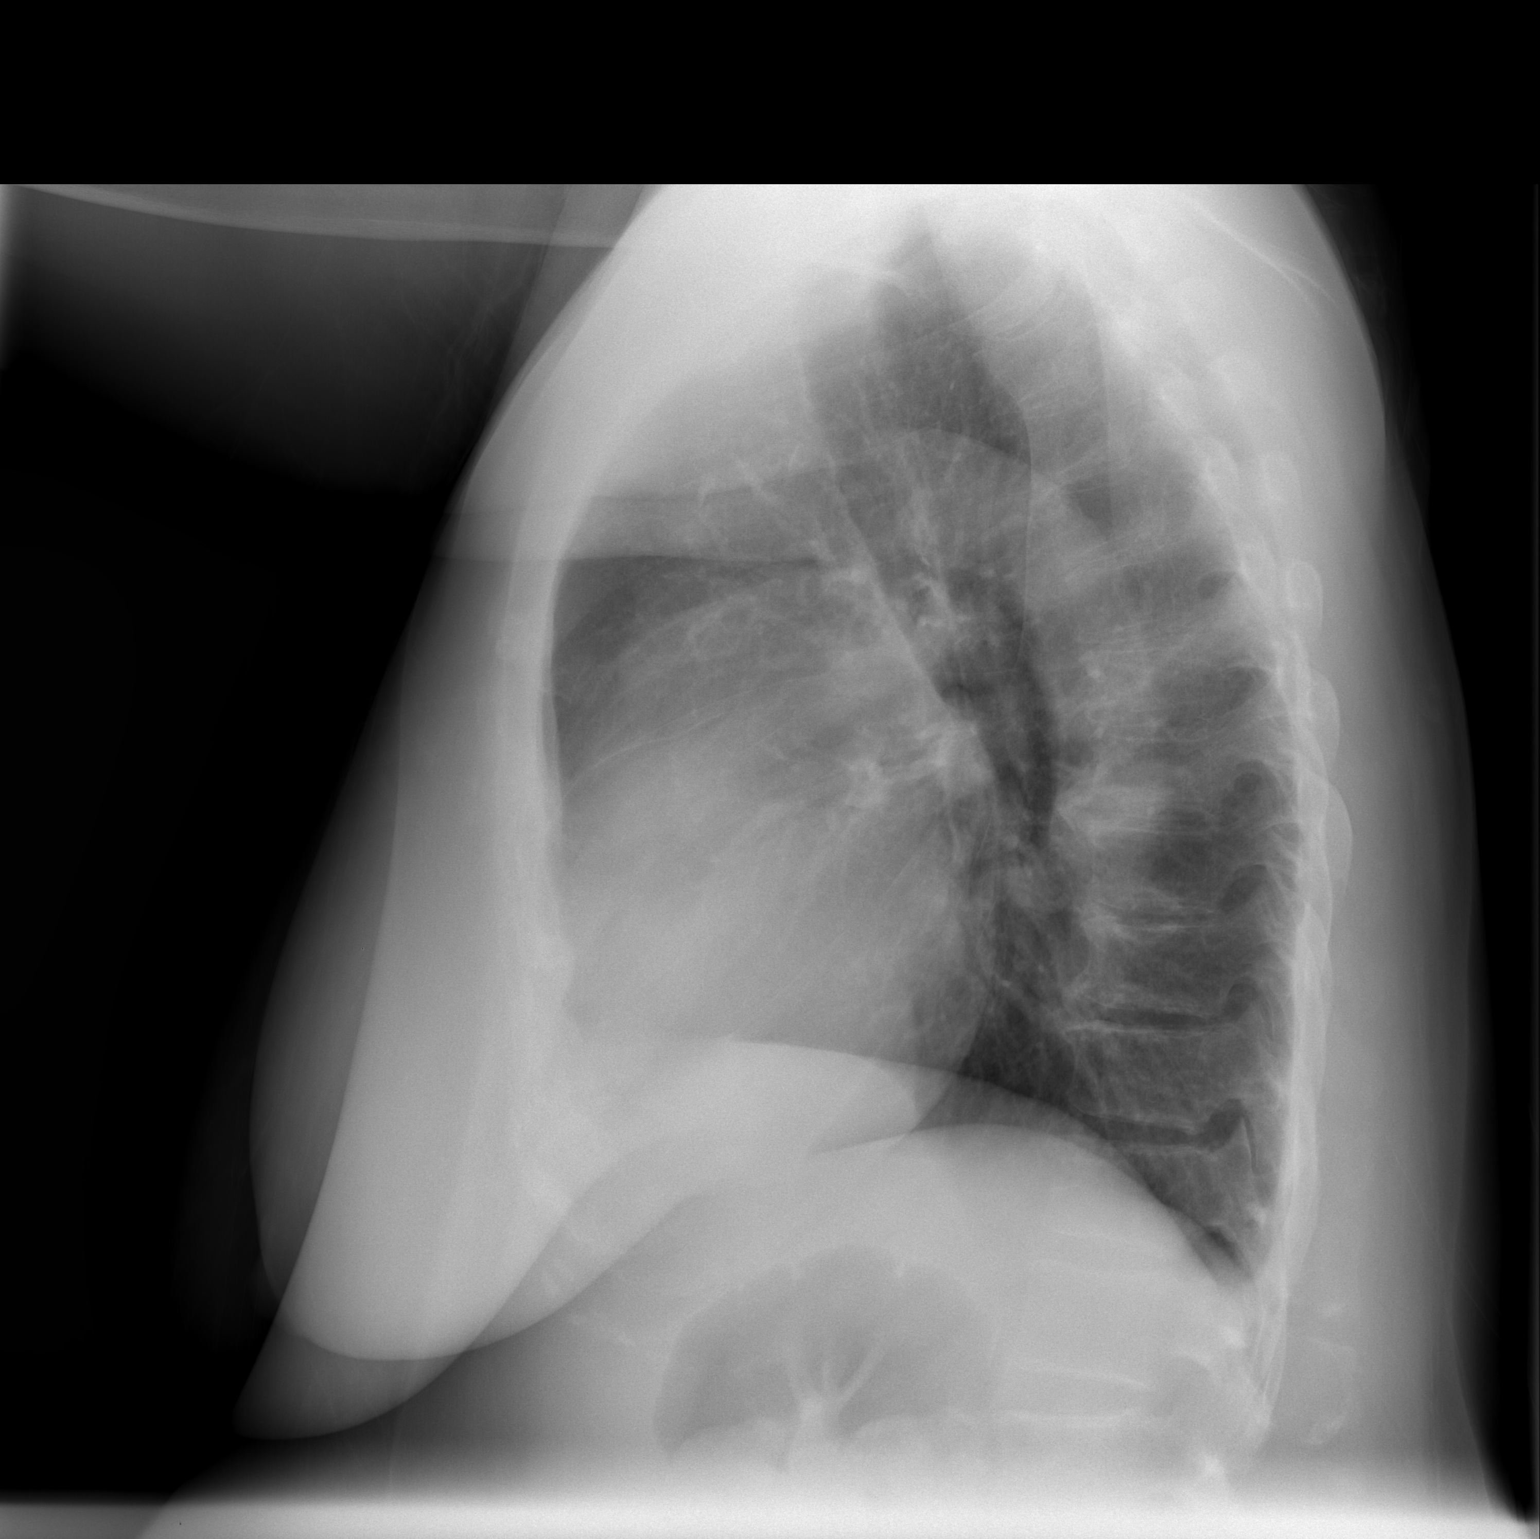

[2 of 2 positions shown; findings below may reference images not displayed]

FINDINGS: Normal cardiac silhouette with the ectatic aorta.  Lungs
are clear.  No effusion, infiltrate, pneumothorax. Degenerative
osteophytosis of the thoracic spine.
IMPRESSION: No acute cardiopulmonary process.

## 2011-06-02 IMAGING — US US ABDOMEN COMPLETE
1 series · 14 of 25 positions shown · non-contrast
Comparison: CT [DATE]

CLINICAL DATA: Upper abdominal pain, nausea, gallstones

COMPLETE ABDOMINAL ULTRASOUND

[Series 1: us abdomen complete · 0.28mm/px · 14 of 58 slices shown]
[im 1/58]
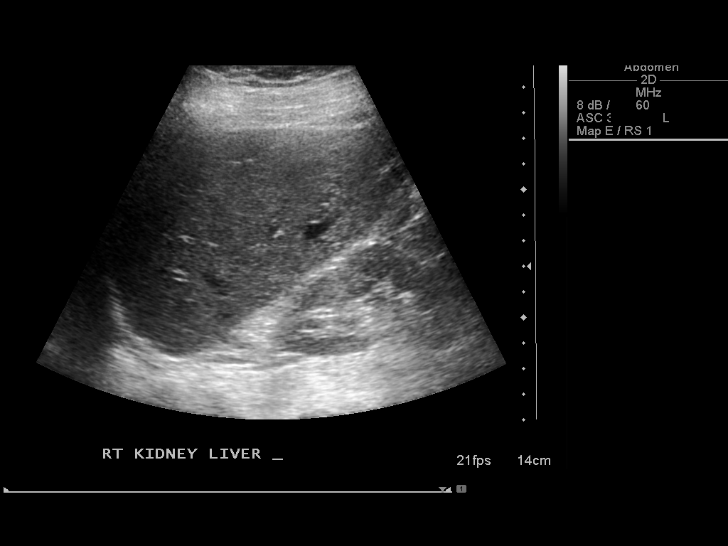
[im 5/58]
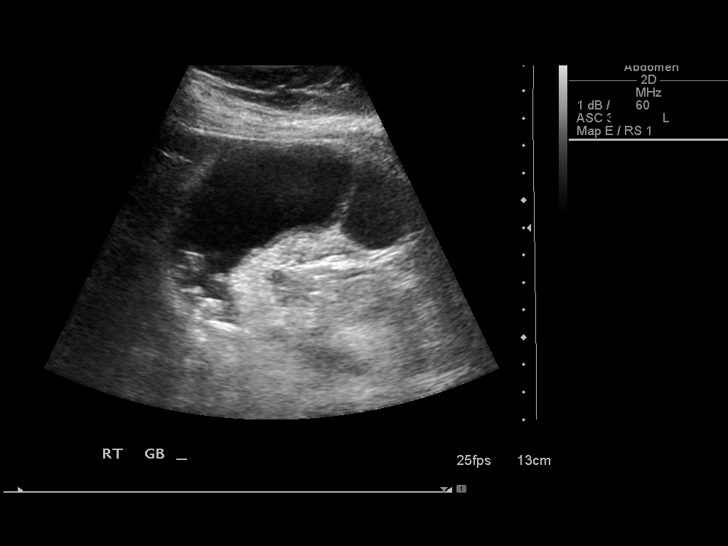
[im 10/58]
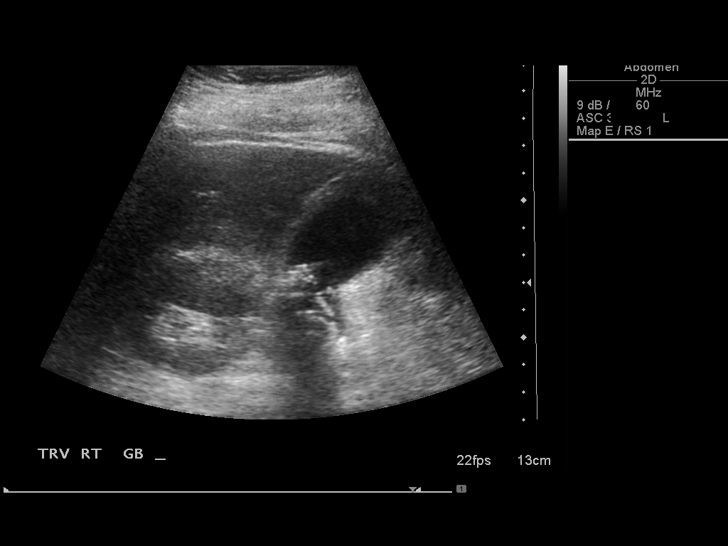
[im 15/58]
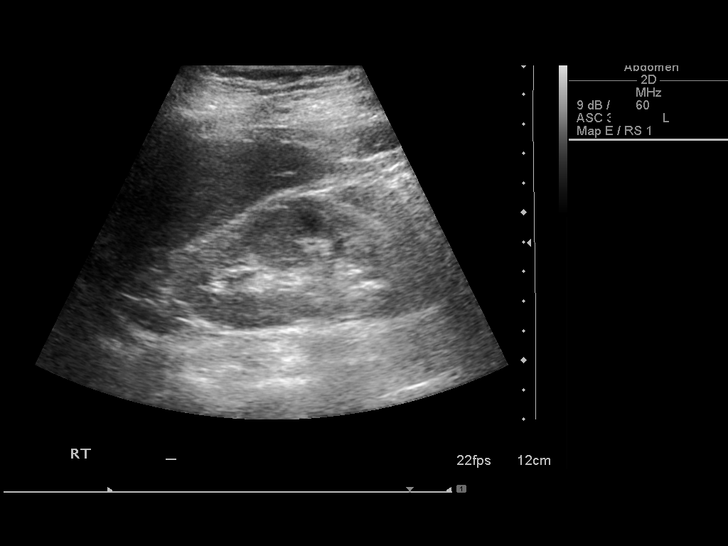
[im 20/58]
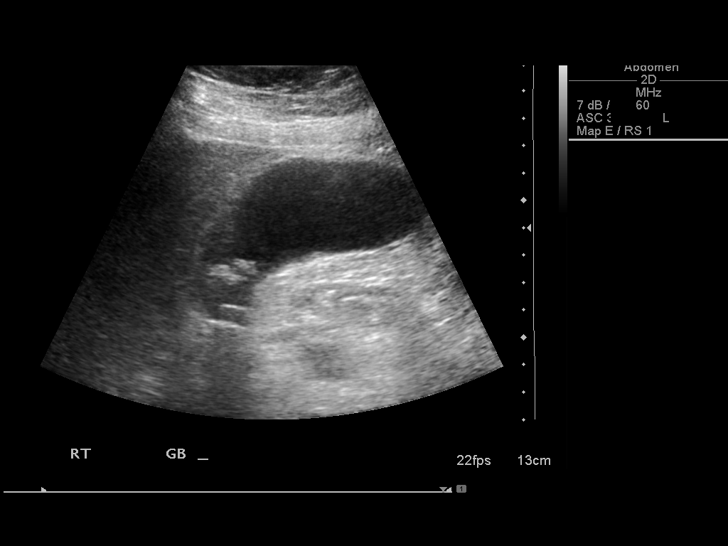
[im 22/58]
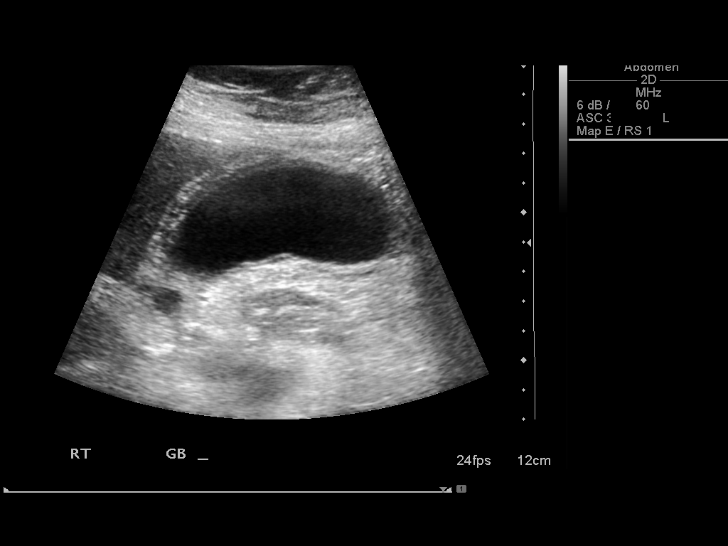
[im 27/58]
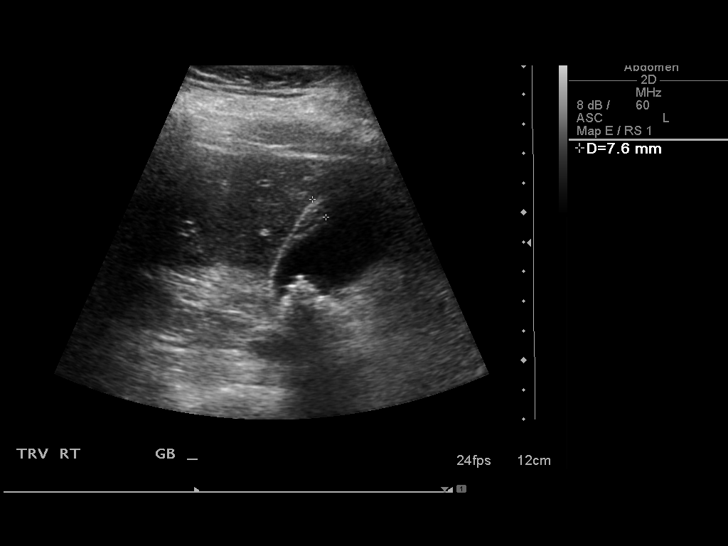
[im 31/58]
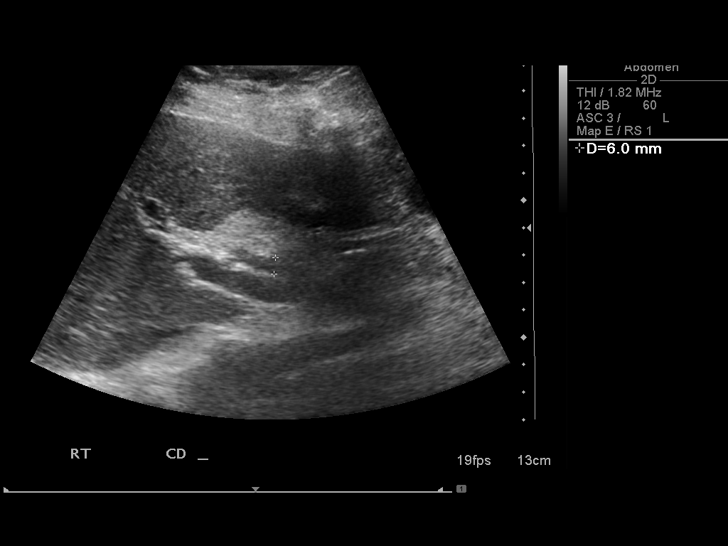
[im 36/58]
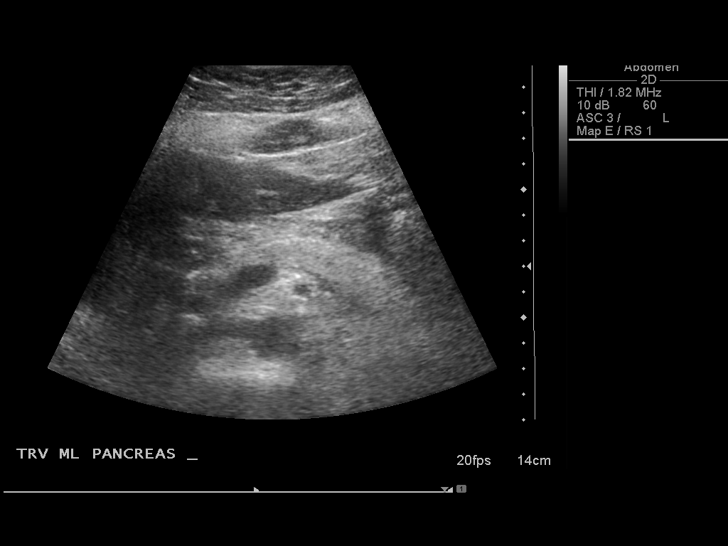
[im 39/58]
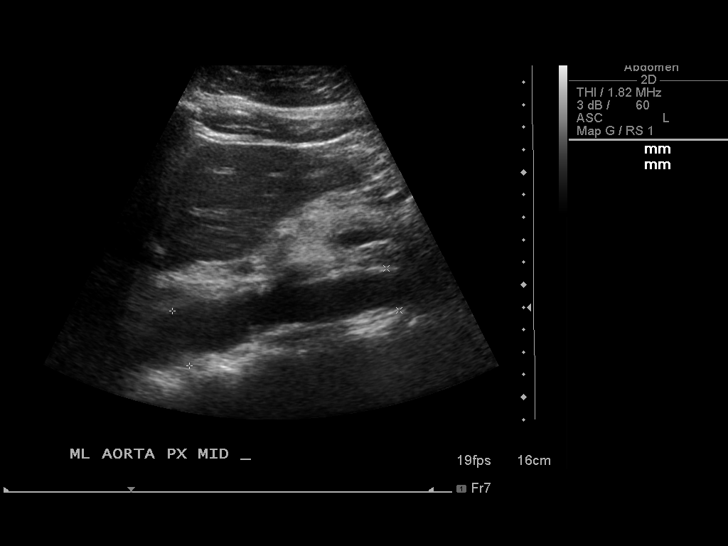
[im 43/58]
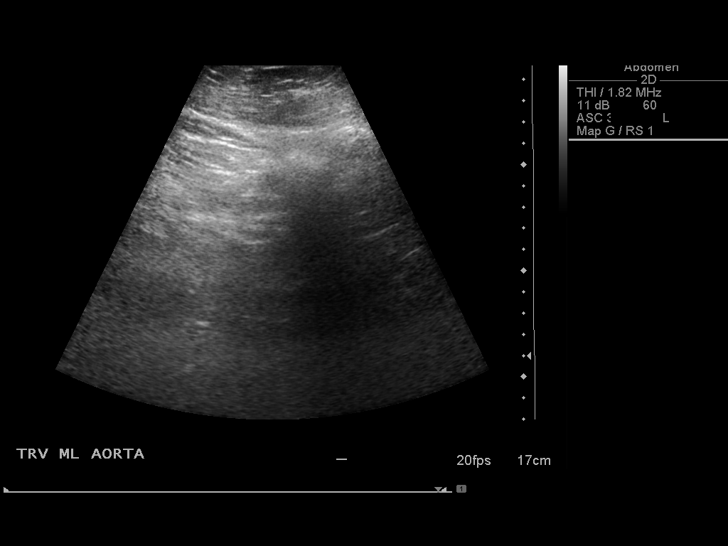
[im 48/58]
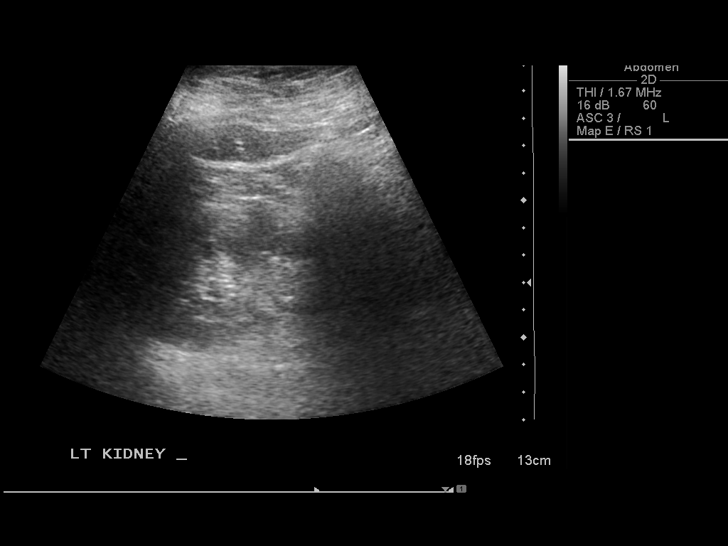
[im 53/58]
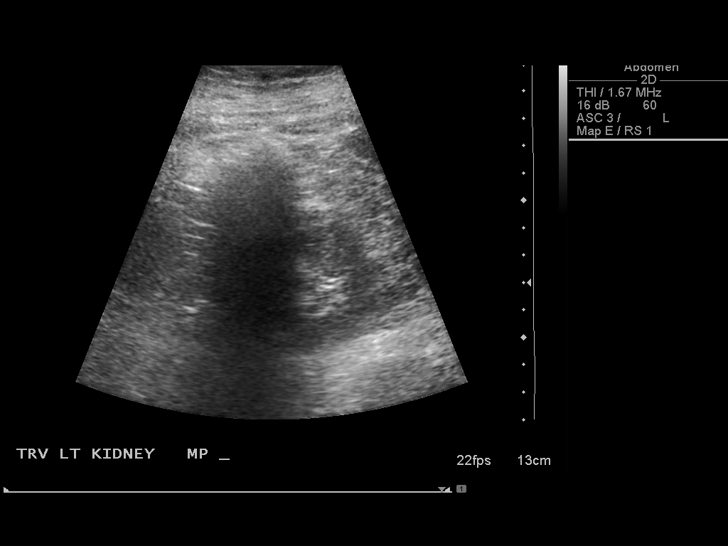
[im 58/58]
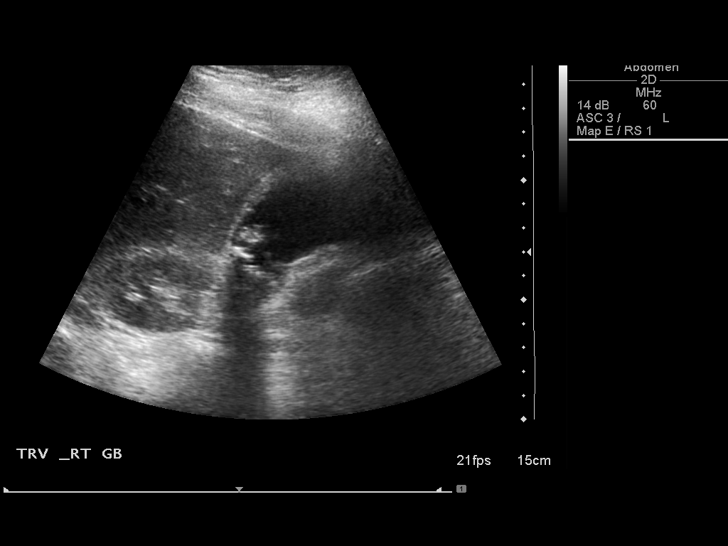

[14 of 25 positions shown; findings below may reference images not displayed]

FINDINGS: Gallbladder:  There are several gallstones in the gallbladder neck
ranging in size from 6-10 mm.  There approximately 6 to 8 stones.
The gallbladder wall is thickened to 6 mm.  There is no
pericholecystic fluid.  Negative sonographic Murphy's sign.

Common bile duct:  Upper limits of normal at 6 mm

Liver:  No focal lesion identified.  Within normal limits in
parenchymal echogenicity.

IVC:  Appears normal.

Pancreas:  No focal abnormality seen.

Spleen:  Normal in size and echogenicity

Right Kidney:  9.7cm in length.  No evidence of hydronephrosis or
stones.

Left Kidney:  8.8cm in length.  No evidence of hydronephrosis or
stones.

Abdominal aorta:  No aneurysm identified.
IMPRESSION: Gallstones with gallbladder wall thickening are signs concerning
for acute cholecystitis.  Negative sonographic Murphy's sign and no
pericholecystic fluid weigh against cholecystitis.  Recommend
clinical correlation for acute cholecystitis and consider nuclear
medicine HIDA scan for increased specificity if clinical pictures
is equivocal.

## 2011-06-02 MED ORDER — ENOXAPARIN SODIUM 40 MG/0.4ML ~~LOC~~ SOLN
40.0000 mg | SUBCUTANEOUS | Status: DC
  Administered 2011-06-02: 40 mg via SUBCUTANEOUS
  Filled 2011-06-02 (×2): qty 0.4

## 2011-06-02 MED ORDER — ENSURE CLINICAL ST REVIGOR PO LIQD
237.0000 mL | Freq: Three times a day (TID) | ORAL | Status: DC
  Administered 2011-06-02 – 2011-06-07 (×6): 237 mL via ORAL
  Administered 2011-06-08: 09:00:00 via ORAL
  Administered 2011-06-09 – 2011-06-15 (×17): 237 mL via ORAL

## 2011-06-02 NOTE — Consult Note (Signed)
Reason for Consult:abdominal pain, gallstones, hernia Referring Physician: Rykia Griffith is an 66 y.o. female.  HPI: I was asked by Dr. [place] Pink to evaluate this patient. She is a 66 year old female admitted to the hospital 2 days ago when she presented to the emergency room complaining of abdominal pain, nausea and vomiting. The patient is not a particularly good historian and a good bit of the history is obtained from her family. She apparently has been having some frequent episodes of nausea for approximately the past year. She apparently has had several episodes of self-limited upper abdominal pain as well. She has been prescribed protonix with only slight relief.  About 24 hour as prior to her presentation to the emergency room she developed the fairly rapid onset of constant aching pain which he describes in the epigastrium without radiation. This was associated with nausea and frequent vomiting. The patient presented and has been admitted by the hospitalist service. She has a known abdominal wall hernia which has been present for 4 or 5 years. It really does not give her any significant pain and it did not seem any different than it usually does at the time of presentation. She had been having normal bowel movements but has not had any since she was admitted. No fever chills or jaundice. No urinary symptoms.  In addition, the patient has been having persistent pain in both lower extremities, mostly her feet left greater than right. She had an infected toenail removed by her podiatrist about 2 weeks ago and has developed dark discoloration of the great toe of her left foot. This was found to be gangrenous on admission. She has had a vascular consultation and may be a candidate for revascularization prior to amputation. Plan is for transfer to [facility] tomorrow for probable vascular intervention.  Past Medical History  Diagnosis Date  . Hypertension   . Hernia     Past  Surgical History  Procedure Date  . Tubal ligation     History reviewed. No pertinent family history.  Social History:  reports that she has been smoking.  She has never used smokeless tobacco. She reports that she does not drink alcohol or use illicit drugs.  Allergies: No Known Allergies  Current Facility-Administered Medications  Medication Dose Route Frequency Provider Last Rate Last Dose  . 0.9 %  sodium chloride infusion   Intravenous Once Abran Richard, [place]      . acetaminophen (TYLENOL) tablet 650 mg  650 mg Oral Q6H PRN Barton Dubois, MD       Or  . acetaminophen (TYLENOL) suppository 650 mg  650 mg Rectal Q6H PRN Barton Dubois, MD      . amLODipine (NORVASC) tablet 10 mg  10 mg Oral Daily Barton Dubois, MD   10 mg at 06/02/11 0925  . aspirin EC tablet 81 mg  81 mg Oral Daily Barton Dubois, MD   81 mg at 06/02/11 0925  . enoxaparin (LOVENOX) injection 40 mg  40 mg Subcutaneous Q24H Bonnielee Haff, MD      . feeding supplement (ENSURE CLINICAL STRENGTH) liquid 237 mL  237 mL Oral TID WC Bonnielee Haff, MD   237 mL at 06/02/11 1200  . hydrALAZINE (APRESOLINE) injection 10 mg  10 mg Intravenous Q6H PRN Barton Dubois, MD      . morphine 2 MG/ML injection 2 mg  2 mg Intravenous Q3H PRN Barton Dubois, MD   2 mg at 06/02/11 0519  . nicotine (NICODERM CQ -  dosed in mg/24 hours) patch 14 mg  14 mg Transdermal Daily Barton Dubois, MD   14 mg at 06/02/11 0925  . ondansetron (ZOFRAN) tablet 4 mg  4 mg Oral Q6H PRN Barton Dubois, MD       Or  . ondansetron [facility]) injection 4 mg  4 mg Intravenous Q6H PRN Barton Dubois, MD      . oxyCODONE (Oxy IR/ROXICODONE) immediate release tablet 5 mg  5 mg Oral Q4H PRN Barton Dubois, MD   5 mg at 06/02/11 1145  . pantoprazole (PROTONIX) EC tablet 40 mg  40 mg Oral Q1200 Barton Dubois, MD   40 mg at 06/02/11 1145  . piperacillin-tazobactam (ZOSYN) IVPB 3.375 g  3.375 g Intravenous [address] [place], PHARMD   3.375 g at 06/02/11 1352  .  polyethylene glycol (MIRALAX / GLYCOLAX) packet 17 g  17 g Oral Daily PRN Barton Dubois, MD      . sodium bicarbonate 50 mEq in dextrose 5 % 1,000 mL infusion   Intravenous Continuous Bonnielee Haff, MD 100 mL/hr at 06/02/11 1359    . vancomycin (VANCOCIN) 750 mg in sodium chloride 0.9 % 150 mL IVPB  750 mg Intravenous Q24H Leeroy Bock, PHARMD   750 mg at 06/01/11 2141  . DISCONTD: enoxaparin (LOVENOX) injection 30 mg  30 mg Subcutaneous Q24H Barton Dubois, MD   30 mg at 06/01/11 2141     Results for orders placed during the hospital encounter of 05/31/11 (from the past 48 hour(s))  BASIC METABOLIC PANEL     Status: Abnormal   Collection Time   06/01/11  3:40 AM      Component Value Range Comment   Sodium 131 (*) 135 - 145 (mEq/L)    Potassium 4.4  3.5 - 5.1 (mEq/L)    Chloride 106  96 - 112 (mEq/L)    CO2 14 (*) 19 - 32 (mEq/L)    Glucose, Bld 86  70 - 99 (mg/dL)    BUN 67 (*) 6 - 23 (mg/dL)    Creatinine, Ser 2.40 (*) 0.50 - 1.10 (mg/dL)    Calcium 8.5  8.4 - 10.5 (mg/dL)    GFR calc non Af Amer 20 (*) >90 (mL/min)    GFR calc Af Amer 23 (*) >90 (mL/min)   CBC     Status: Abnormal   Collection Time   06/01/11  3:40 AM      Component Value Range Comment   WBC 13.8 (*) 4.0 - 10.5 (K/uL)    RBC 2.31 (*) 3.87 - 5.11 (MIL/uL)    Hemoglobin 6.8 (*) 12.0 - 15.0 (g/dL)    HCT 20.3 (*) 36.0 - 46.0 (%)    MCV 87.9  78.0 - 100.0 (fL)    MCH 29.4  26.0 - 34.0 (pg)    MCHC 33.5  30.0 - 36.0 (g/dL)    RDW 13.7  11.5 - 15.5 (%)    Platelets 349  150 - 400 (K/uL)   ABO/RH     Status: Normal   Collection Time   06/01/11  6:00 AM      Component Value Range Comment   ABO/RH(D) B POS     PREPARE RBC (CROSSMATCH)     Status: Normal   Collection Time   06/01/11  6:45 AM      Component Value Range Comment   Order Confirmation ORDER PROCESSED BY BLOOD BANK     TYPE AND SCREEN     Status: Normal   Collection  Time   06/01/11  6:45 AM      Component Value Range Comment   ABO/RH(D) B POS       Antibody Screen NEG      Sample Expiration 06/04/2011      Unit Number [id number]      Blood Component Type RED CELLS,LR      Unit division 00      Status of Unit ISSUED,FINAL      Transfusion Status OK TO TRANSFUSE      Crossmatch Result Compatible      Unit Number [id number]      Blood Component Type RED CELLS,LR      Unit division 00      Status of Unit ISSUED,FINAL      Transfusion Status OK TO TRANSFUSE      Crossmatch Result Compatible     CBC     Status: Abnormal   Collection Time   06/01/11  7:58 PM      Component Value Range Comment   WBC 14.3 (*) 4.0 - 10.5 (K/uL)    RBC 3.31 (*) 3.87 - 5.11 (MIL/uL)    Hemoglobin 9.8 (*) 12.0 - 15.0 (g/dL)    HCT 29.0 (*) 36.0 - 46.0 (%)    MCV 87.6  78.0 - 100.0 (fL)    MCH 29.6  26.0 - 34.0 (pg)    MCHC 33.8  30.0 - 36.0 (g/dL)    RDW 14.0  11.5 - 15.5 (%)    Platelets 347  150 - 400 (K/uL)   CBC     Status: Abnormal   Collection Time   06/02/11  4:35 AM      Component Value Range Comment   WBC 14.3 (*) 4.0 - 10.5 (K/uL)    RBC 3.22 (*) 3.87 - 5.11 (MIL/uL)    Hemoglobin 9.5 (*) 12.0 - 15.0 (g/dL)    HCT 28.0 (*) 36.0 - 46.0 (%)    MCV 87.0  78.0 - 100.0 (fL)    MCH 29.5  26.0 - 34.0 (pg)    MCHC 33.9  30.0 - 36.0 (g/dL)    RDW 14.2  11.5 - 15.5 (%)    Platelets 314  150 - 400 (K/uL)   COMPREHENSIVE METABOLIC PANEL     Status: Abnormal   Collection Time   06/02/11  4:35 AM      Component Value Range Comment   Sodium 132 (*) 135 - 145 (mEq/L)    Potassium 4.3  3.5 - 5.1 (mEq/L)    Chloride 107  96 - 112 (mEq/L)    CO2 17 (*) 19 - 32 (mEq/L)    Glucose, Bld 114 (*) 70 - 99 (mg/dL)    BUN 41 (*) 6 - 23 (mg/dL)    Creatinine, Ser 1.68 (*) 0.50 - 1.10 (mg/dL)    Calcium 8.7  8.4 - 10.5 (mg/dL)    Total Protein 6.4  6.0 - 8.3 (g/dL)    Albumin 2.8 (*) 3.5 - 5.2 (g/dL)    AST 33  0 - 37 (U/L)    ALT 8  0 - 35 (U/L)    Alkaline Phosphatase 83  39 - 117 (U/L)    Total Bilirubin 0.3  0.3 - 1.2 (mg/dL)    GFR calc non Af Amer 31  (*) >90 (mL/min)    GFR calc Af Amer 36 (*) >90 (mL/min)   LIPASE, BLOOD     Status: Normal   Collection Time   06/02/11  4:35 AM  Component Value Range Comment   Lipase 48  11 - 59 (U/L)     Dg Chest 2 View  06/02/2011  *RADIOLOGY REPORT*  Clinical Data: Preop hernia repair  CHEST - 2 VIEW  Comparison: Chest radiograph 12/21/2007  Findings: Normal cardiac silhouette with the ectatic aorta.  Lungs are clear.  No effusion, infiltrate, pneumothorax. Degenerative osteophytosis of the thoracic spine.  IMPRESSION: No acute cardiopulmonary process.  Original Report Authenticated By: Suzy Bouchard, M.D.   US Abdomen Complete  06/02/2011  *RADIOLOGY REPORT*  Clinical Data:  Upper abdominal pain, nausea, gallstones  COMPLETE ABDOMINAL ULTRASOUND  Comparison:  CT 05/31/2011  Findings:  Gallbladder:  There are several gallstones in the gallbladder neck ranging in size from 6-10 mm.  There approximately 6 to 8 stones. The gallbladder wall is thickened to 6 mm.  There is no pericholecystic fluid.  Negative sonographic Murphy's sign.  Common bile duct:  Upper limits of normal at 6 mm  Liver:  No focal lesion identified.  Within normal limits in parenchymal echogenicity.  IVC:  Appears normal.  Pancreas:  No focal abnormality seen.  Spleen:  Normal in size and echogenicity  Right Kidney:  9.7cm in length.  No evidence of hydronephrosis or stones.  Left Kidney:  8.8cm in length.  No evidence of hydronephrosis or stones.  Abdominal aorta:  No aneurysm identified.  IMPRESSION:  Gallstones with gallbladder wall thickening are signs concerning for acute cholecystitis.  Negative sonographic Murphy's sign and no pericholecystic fluid weigh against cholecystitis.  Recommend clinical correlation for acute cholecystitis and consider nuclear medicine HIDA scan for increased specificity if clinical pictures is equivocal.  Original Report Authenticated By: Suzy Bouchard, M.D.    Review of Systems  Constitutional: Negative  for fever, chills and weight loss.  Respiratory: Negative for shortness of breath and wheezing.   Cardiovascular: Negative for chest pain and palpitations.  Gastrointestinal: Positive for nausea, vomiting and abdominal pain. Negative for blood in stool and melena.  Genitourinary: Negative.    Blood pressure 166/93, pulse 99, temperature 97.7 F (36.5 C), temperature source Oral, resp. rate 18, height 5\' 8"  (1.727 m), weight 170 lb (77.111 kg), SpO2 99.00%. Physical Exam General: Well-developed alert African American female in no acute distress Skin: No rash or infection HEENT: No palpable masses. Sclera nonicteric. Pupils equal and reactive. Lymph nodes: No palpable cervical, supraclavicular, or axillary nodes Cardiovascular: Regular rate and rhythm. No JVD or edema. There are no palpable lower extremity pulses. The left foot is cool distally and there is dry gangrene involving the great toe extending back slightly onto the forefoot and over to the second toe Abdomen: There is a good sized upper midline ventral hernia with about a 10-15 cm hernia mass. This is above the umbilicus where she has just a small incision. Her hernia is not completely reducible but is soft and minimally tender. There is very mild diffuse abdominal tenderness but more distinct localized moderate right upper quadrant tenderness. No discernible masses or organomegaly. Extremities: Significant for gangrene involving the toes of the left foot as above Neuro: Alert and fully oriented. No gross motor deficits.    Assessment/Plan: 72 her old female who presents with acute upper abdominal pain, nausea and vomiting. She has been having some recurrent symptoms for some time. She has a minimally elevated lipase and the remainder of her LFTs are normal. Ultrasound shows multiple gallstones and some thickening of the gallbladder wall. She has a significant apparent primary epigastric abdominal wall hernia containing  colon. This does  not appear to be incarcerated. I expect that her abdominal symptoms are secondary to cholelithiasis and cholecystitis. She currently is improving and does not appear to have significant acute cholecystitis. It is conceivable that her symptoms are secondary to her hernia but I think this is less likely.  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 At this point this would appear to be the more urgent problem. I believe she should have at some point a cholecystectomy and repair of her ventral hernia which I think could be done simultaneously, and laparoscopically. However I would defer this until her vascular problem is resolved. Hopefully her abdominal surgery could be done later on this hospitalization.  Mckay Brandt T 06/02/2011, 5:18 PM

## 2011-06-02 NOTE — Progress Notes (Signed)
PCP: None according to patient and daughter  Brief HPI:  66 year old female with a past medical history significant for hypertension, chronic kidney disease stage III, abdominal hernia (no incarcerated), recent left big toe infection (status post toenail removal by podiatry and antibiotic use as an outpatient); came into the hospital complaining of abdominal pain, nausea/vomiting and decreased intake for the 24-40 hours prior to admission. The patient denied any fever but she endorses having associated chills with her symptoms. Patient reports approximately 6-7 episodes of vomiting and was having difficulty keeping things down, at this moment she came to the emergency department for further evaluation and treatment. In the ED patient was found with a leukocytosis (in the 17,000 range), creatinine 2.97 with a GFR of 17, elevated lipase, hyponatremia and also left first toe with dry gangrene and gas on x-ray. Of note she denied any chest pain, hematemesis, shortness of breath, melena/hematochezia, any cough or any other acute complaints. Patient also reports having a normal bowel movement on the day of admission. Patient is currently not compliant with any of her medications for the last 3 months; and is not followed by any PCP at this time.  Consultants: Dr. Alvan Dame (Ortho), Dr. Kellie Simmering (Vascular)  Procedures: None so far  Subjective: Patient says she slept well overnight. No pain currently. Apprehensive about procedure.  Objective: Vital signs in last 24 hours: Temp:  [97.4 F (36.3 C)-97.9 F (36.6 C)] 97.6 F (36.4 C) (03/02 0510) Pulse Rate:  [87-96] 93  (03/02 0510) Resp:  [16-24] 20  (03/02 0510) BP: (145-188)/(73-85) 145/75 mmHg (03/02 0510) SpO2:  [98 %-100 %] 100 % (03/02 0510) Weight change:  Last BM Date: 05/31/11  Intake/Output from previous day: 03/01 0701 - 03/02 0700 In: 2925 [P.O.:600; I.V.:1520; Blood:555; IV Piggyback:250] Out: 1950 [Urine:1950] Intake/Output this  shift:    General appearance: alert, cooperative and no distress Head: Normocephalic, without obvious abnormality, atraumatic Resp: clear to auscultation bilaterally Cardio: regular rate and rhythm, S1, S2 normal, no murmur, click, rub or gallop GI: soft, ventral hernia noted, slight tenderness present, no masses, BS present. Extremities: left 1st toe is gangrenous. Tender to palpation Pulses: poor pulses LLE Skin: gangrenous left 1st toe Neurologic: Grossly normal  Lab Results:  Basename 06/02/11 0435 06/01/11 1958  WBC 14.3* 14.3*  HGB 9.5* 9.8*  HCT 28.0* 29.0*  PLT 314 347   BMET  Basename 06/02/11 0435 06/01/11 0340 05/31/11 1100  NA 132* 131* --  K 4.3 4.4 --  CL 107 106 --  CO2 17* 14* --  GLUCOSE 114* 86 --  BUN 41* 67* --  CREATININE 1.68* 2.40* --  CALCIUM 8.7 8.5 --  ALT 8 -- 7    Studies/Results: Ct Abdomen Pelvis Wo Contrast  05/31/2011  *RADIOLOGY REPORT*  Clinical Data: Pain with nausea and vomiting.  CT ABDOMEN AND PELVIS WITHOUT CONTRAST  Technique:  Multidetector CT imaging of the abdomen and pelvis was performed following the standard protocol without intravenous contrast.  Comparison: 08/30/2008  Findings: There are multiple gallstones.  Gallbladder wall is not thickened.  No dilated bile ducts.  Liver, spleen, pancreas, and kidneys are normal.  Bilateral benign adrenal hyperplasia, stable.  Midline abdominal hernia containing a portion of the transverse colon without evidence of obstruction and without change.  The numerous diverticula in the distal descending and sigmoid portion of the colon without diverticulitis.  Terminal ileum and appendix are normal.  Uterus and ovaries are normal.  Small amount of nonspecific free fluid in the  pelvic cul-de-sac.  No significant osseous abnormality.  IMPRESSION: No acute abnormalities of the abdomen or pelvis.  Cholelithiasis. Benign adrenal hyperplasia.  Anterior abdominal wall hernia containing colon.  Diverticulosis.   Original Report Authenticated By: Larey Seat, M.D.   Dg Foot Complete Left  05/31/2011  *RADIOLOGY REPORT*  Clinical Data: Wound at left great toe  LEFT FOOT - COMPLETE 3+ VIEW  Comparison: None  Findings: Osseous demineralization. Joint spaces preserved. Soft tissue irregularity and distal phalanx. Question soft tissue gas at medial aspect of distal phalanx great toe. No definite acute fracture, dislocation or bone destruction.  IMPRESSION: No acute osseous abnormalities. Diffuse osseous demineralization. Question soft tissue gas at medial aspect of distal phalanx.  Original Report Authenticated By: Burnetta Sabin, M.D.    Medications:  Scheduled:    . sodium chloride   Intravenous Once  . amLODipine  10 mg Oral Daily  . aspirin EC  81 mg Oral Daily  . enoxaparin  30 mg Subcutaneous Q24H  . nicotine  14 mg Transdermal Daily  . pantoprazole  40 mg Oral Q1200  . piperacillin-tazobactam (ZOSYN)  IV  3.375 g Intravenous Q8H  . pneumococcal 23 valent vaccine  0.5 mL Intramuscular Tomorrow-1000  . vancomycin  750 mg Intravenous Q24H    Assessment/Plan:  Principal Problem:  *Dry gangrene Active Problems:  ANEMIA-NOS  Essential hypertension, benign  Chronic kidney disease (CKD), stage III (moderate)  Abdominal pain  Nausea & vomiting  Leukocytosis  Hyponatremia  Hyperglycemia  Acute on chronic renal failure  PAD (peripheral artery disease)    1-Dry gangrene Left 1st Toe: With most likely superimposed cellulitis with Leukocytosis. She has severe vascular disease based on the arterial dopplers. Seen by Dr. Kellie Simmering with Vascular who recommends arteriogram and amputation as stated in his note. Plan to transfer to [facility] on 06/03/11. Continue broad spectrum antibiotics for now (Vanc and Zosyn initiated 2/28)  2-Acute on Chronic kidney disease (CKD), (stage III) with Non AG Metabolic Acidosis: Secondary to prerenal azotemia most likely due to volume contraction. Baseline creatinine not  clearly known. Last known labs in our system from 2010 (2.7 in May 2010). Continue IVF with Bicarb. Improvement in creat and bicarb noted.  4-Abnormal UA: Urine cultures are negative.    5-Abdominal pain, Nausea & vomiting: Lipase was only mildly elevated which could have been from the nausea and vomiting. She has gallstones which could be the reason for nausea. Will evaluate using [place]. Continue clear liquids for now.  6-ANEMIA-NOS: Hgb improved post transfusion. Anemia panel suggests anemia due to chronic disease. No overt bleeding noted. FOBT pending.   7-Essential hypertension, benign: BP reasonable well controlled on Amlodipine 10mg  daily. PRN hydralazine for elevated BP.  8-Hyponatremia: Most likely secondary to dehydration and decreased by mouth intake. Improved with fluids.  9-Tobacco abuse: A smoking cessation counseling has been provided and nicotine patch has been ordered.   10- Abdominal Ventral hernia: Patient with chronic abdominal hernia that at this point is not incarcerated/strangulated; will continue monitoring.   11-DVT Prophylaxis: lovenox   12-Malnutrition: Dietitian following. Consider Ensure.  Full Code    LOS: 2 days   [facility] Pager [phone] 06/02/2011, 7:56 AM

## 2011-06-02 NOTE — Progress Notes (Signed)
Patient ID: Hamda Herro, female   DOB: [birth year], 66 y.o.   MRN: [id number] Vascular Surgery Progress Note  Subjective: Gangrene left first toe secondary to peripheral vascular disease  Objective:  Filed Vitals:   06/02/11 1413  BP: 166/93  Pulse: 99  Temp: 97.7 F (36.5 C)  Resp: 18    Left first toe unchanged on physical exam. There is dry gangrene distally. There some darkening of the skin medially with no evidence of cellulitis or fluctuance Creatinine has improved to 1.68 with BUN of 41 down from 2.97 48 hours ago   Labs:  Lab 06/02/11 0435 06/01/11 0340 05/31/11 1100  CREATININE 1.68* 2.40* 2.97*    Lab 06/02/11 0435 06/01/11 0340 05/31/11 1100  NA 132* 131* 128*  K 4.3 4.4 4.3  CL 107 106 97  CO2 17* 14* 14*  BUN 41* 67* 84*  CREATININE 1.68* 2.40* 2.97*  LABGLOM -- -- --  GLUCOSE 114* -- --  CALCIUM 8.7 8.5 9.3    Lab 06/02/11 0435 06/01/11 1958 06/01/11 0340  WBC 14.3* 14.3* 13.8*  HGB 9.5* 9.8* 6.8*  HCT 28.0* 29.0* 20.3*  PLT 314 347 349   No results found for this basename: INR:3 in the last 168 hours  I/O last 3 completed shifts: In: 2925 [P.O.:600; I.V.:1520; Blood:555; IV Piggyback:250] Out: 1950 [Urine:1950]  Imaging: Dg Chest 2 View  06/02/2011  *RADIOLOGY REPORT*  Clinical Data: Preop hernia repair  CHEST - 2 VIEW  Comparison: Chest radiograph 12/21/2007  Findings: Normal cardiac silhouette with the ectatic aorta.  Lungs are clear.  No effusion, infiltrate, pneumothorax. Degenerative osteophytosis of the thoracic spine.  IMPRESSION: No acute cardiopulmonary process.  Original Report Authenticated By: Suzy Bouchard, M.D.   US Abdomen Complete  06/02/2011  *RADIOLOGY REPORT*  Clinical Data:  Upper abdominal pain, nausea, gallstones  COMPLETE ABDOMINAL ULTRASOUND  Comparison:  CT 05/31/2011  Findings:  Gallbladder:  There are several gallstones in the gallbladder neck ranging in size from 6-10 mm.  There approximately 6 to 8 stones. The gallbladder  wall is thickened to 6 mm.  There is no pericholecystic fluid.  Negative sonographic Murphy's sign.  Common bile duct:  Upper limits of normal at 6 mm  Liver:  No focal lesion identified.  Within normal limits in parenchymal echogenicity.  IVC:  Appears normal.  Pancreas:  No focal abnormality seen.  Spleen:  Normal in size and echogenicity  Right Kidney:  9.7cm in length.  No evidence of hydronephrosis or stones.  Left Kidney:  8.8cm in length.  No evidence of hydronephrosis or stones.  Abdominal aorta:  No aneurysm identified.  IMPRESSION:  Gallstones with gallbladder wall thickening are signs concerning for acute cholecystitis.  Negative sonographic Murphy's sign and no pericholecystic fluid weigh against cholecystitis.  Recommend clinical correlation for acute cholecystitis and consider nuclear medicine HIDA scan for increased specificity if clinical pictures is equivocal.  Original Report Authenticated By: Suzy Bouchard, M.D.    Assessment/Plan Plan transfer to [facility] tomorrow. We'll plan angiography by Dr. Doren Custard on Monday with intervention if feasible. If not and she is candidate for bypass we'll then proceed with lower extremity bypass to be followed by partial foot amputation   Tinnie Gens, MD 06/02/2011 9:29 PM

## 2011-06-03 DIAGNOSIS — K8010 Calculus of gallbladder with chronic cholecystitis without obstruction: Secondary | ICD-10-CM | POA: Diagnosis present

## 2011-06-03 DIAGNOSIS — K439 Ventral hernia without obstruction or gangrene: Secondary | ICD-10-CM | POA: Diagnosis present

## 2011-06-03 LAB — COMPREHENSIVE METABOLIC PANEL
ALT: 8 U/L (ref 0–35)
AST: 38 U/L — ABNORMAL HIGH (ref 0–37)
Albumin: 2.6 g/dL — ABNORMAL LOW (ref 3.5–5.2)
Alkaline Phosphatase: 84 U/L (ref 39–117)
BUN: 28 mg/dL — ABNORMAL HIGH (ref 6–23)
CO2: 18 mEq/L — ABNORMAL LOW (ref 19–32)
Calcium: 8.6 mg/dL (ref 8.4–10.5)
Chloride: 103 mEq/L (ref 96–112)
Creatinine, Ser: 1.46 mg/dL — ABNORMAL HIGH (ref 0.50–1.10)
GFR calc Af Amer: 42 mL/min — ABNORMAL LOW (ref >90)
GFR calc non Af Amer: 36 mL/min — ABNORMAL LOW (ref >90)
Glucose, Bld: 131 mg/dL — ABNORMAL HIGH (ref 70–99)
Potassium: 4.5 mEq/L (ref 3.5–5.1)
Sodium: 130 mEq/L — ABNORMAL LOW (ref 135–145)
Total Bilirubin: 0.3 mg/dL (ref 0.3–1.2)
Total Protein: 6.4 g/dL (ref 6.0–8.3)

## 2011-06-03 LAB — CBC
HCT: 26.9 % — ABNORMAL LOW (ref 36.0–46.0)
Hemoglobin: 9.3 g/dL — ABNORMAL LOW (ref 12.0–15.0)
MCH: 30 pg (ref 26.0–34.0)
MCHC: 34.6 g/dL (ref 30.0–36.0)
MCV: 86.8 fL (ref 78.0–100.0)
Platelets: 340 10*3/uL (ref 150–400)
RBC: 3.1 MIL/uL — ABNORMAL LOW (ref 3.87–5.11)
RDW: 14.3 % (ref 11.5–15.5)
WBC: 16.5 10*3/uL — ABNORMAL HIGH (ref 4.0–10.5)

## 2011-06-03 MED ORDER — VANCOMYCIN HCL 1000 MG IV SOLR
750.0000 mg | Freq: Two times a day (BID) | INTRAVENOUS | Status: DC
  Filled 2011-06-03 (×3): qty 750

## 2011-06-03 MED ORDER — SODIUM CHLORIDE 0.45 % IV SOLN
INTRAVENOUS | Status: DC
  Administered 2011-06-03 – 2011-06-04 (×4): via INTRAVENOUS
  Filled 2011-06-03 (×9): qty 50

## 2011-06-03 MED ORDER — VANCOMYCIN HCL 1000 MG IV SOLR
750.0000 mg | Freq: Two times a day (BID) | INTRAVENOUS | Status: DC
  Administered 2011-06-03 – 2011-06-05 (×5): 750 mg via INTRAVENOUS
  Filled 2011-06-03 (×6): qty 750

## 2011-06-03 MED ORDER — ACETYLCYSTEINE 10% NICU ORAL/RECTAL SOLUTION: 1.0000 mL/kg | Freq: Two times a day (BID) | Status: DC

## 2011-06-03 NOTE — Progress Notes (Signed)
ANTIBIOTIC CONSULT NOTE - INITIAL  Pharmacy Consult: Vancomycin and Zosyn (D#4) Indication: Gangrene  No Known Allergies  Patient Measurements: Height: 5\' 8"  (172.7 cm) Weight: 201 lb 11.5 oz (91.5 kg) IBW/kg (Calculated) : 63.9   Vital Signs: Temp: 98 F (36.7 C) (03/03 1207) Temp src: Oral (03/03 1207) BP: 148/79 mmHg (03/03 1207) Pulse Rate: 92  (03/03 1207)  Labs:  Basename 06/03/11 0444 06/02/11 0435 06/01/11 1958 06/01/11 0340  WBC 16.5* 14.3* 14.3* --  HGB 9.3* 9.5* 9.8* --  PLT 340 314 347 --  LABCREA -- -- -- --  CREATININE 1.46* 1.68* -- 2.40*   Estimated Creatinine Clearance: 44.8 ml/min (by C-G formula based on Cr of 1.46).  No results found for this basename: VANCOTROUGH:2,VANCOPEAK:2,VANCORANDOM:2,GENTTROUGH:2,GENTPEAK:2,GENTRANDOM:2,TOBRATROUGH:2,TOBRAPEAK:2,TOBRARND:2,AMIKACINPEAK:2,AMIKACINTROU:2,AMIKACIN:2, in the last 72 hours   Microbiology: Recent Results (from the past 720 hour(s))  URINE CULTURE     Status: Normal   Collection Time   05/31/11  3:22 PM      Component Value Range Status Comment   Specimen Description URINE, CLEAN CATCH   Final    Special Requests NONE   Final    Culture  Setup Time [id number]   Final    Colony Count NO GROWTH   Final    Culture NO GROWTH   Final    Report Status 06/01/2011 FINAL   Final     Medical History: Past Medical History  Diagnosis Date  . Hypertension   . Hernia     Medications:   Anti-infectives     Start     Dose/Rate Route Frequency Ordered Stop   06/01/11 2200   vancomycin (VANCOCIN) 750 mg in sodium chloride 0.9 % 150 mL IVPB        750 mg 150 mL/hr over 60 Minutes Intravenous Every 24 hours 05/31/11 1744     06/01/11 0200  piperacillin-tazobactam (ZOSYN) IVPB 3.375 g       3.375 g 12.5 mL/hr over 240 Minutes Intravenous 3 times per day 05/31/11 1744     05/31/11 1800   vancomycin (VANCOCIN) IVPB 1000 mg/200 mL premix        1,000 mg 200 mL/hr over 60 Minutes Intravenous  Once  05/31/11 1744 05/31/11 1928   05/31/11 1800  piperacillin-tazobactam (ZOSYN) IVPB 3.375 g       3.375 g 100 mL/hr over 30 Minutes Intravenous  Once 05/31/11 1744 05/31/11 1829          Assessment: 33 YOF with h/o CKD admitted with c/o abdominal pain, nausea/vomiting, dark discoloration of left big toe. Patient has a h/o left big toe infection (s/p toenail removal by podiatry and outpatient antibiotic use). Left first toe with dry gangrene and gas on x-ray, but no evidence of OM. Patient started on vanc and Zosyn empirically.  Noted improvement in renal function.  Goal of Therapy:  Vancomycin trough level 15-20 mcg/ml   Plan:  - Change vanc 750mg  IV Q12H - Continue Zosyn 3.375g IV Q8H infused over 4hrs - F/U VTE px post surgery - Monitor renal fxn, clinical course, vanc trough soon   Stephanie Griffith, PharmD 06/03/2011 1:17 PM

## 2011-06-03 NOTE — Progress Notes (Signed)
Pt assessment unchanged from a.m.  Report called to [facility] unit 2000.  Pt transported to [facility] via [facility].

## 2011-06-03 NOTE — Progress Notes (Addendum)
PCP: None according to patient and daughter  Brief HPI:  66 year old female with a past medical history significant for hypertension, chronic kidney disease stage III, abdominal hernia (no incarcerated), recent left big toe infection (status post toenail removal by podiatry and antibiotic use as an outpatient); came into the hospital complaining of abdominal pain, nausea/vomiting and decreased intake for the 24-40 hours prior to admission. The patient denied any fever but she endorses having associated chills with her symptoms. Patient reports approximately 6-7 episodes of vomiting and was having difficulty keeping things down, at this moment she came to the emergency department for further evaluation and treatment. In the ED patient was found with a leukocytosis (in the 17,000 range), creatinine 2.97 with a GFR of 17, elevated lipase, hyponatremia and also left first toe with dry gangrene and gas on x-ray. Of note she denied any chest pain, hematemesis, shortness of breath, melena/hematochezia, any cough or any other acute complaints. Patient also reports having a normal bowel movement on the day of admission. Patient is currently not compliant with any of her medications for the last 3 months; and is not followed by any PCP at this time.  Consultants: Dr. Alvan Dame (Ortho), Dr. Kellie Simmering (Vascular), Gen Surg (Dr. Excell Seltzer)  Procedures: None so far  Subjective: Patient says she was struck on the left foot accidentally and this caused a lot of pain. Now better. Tolerating liquids but still with some abdominal discomfort.  Objective: Vital signs in last 24 hours: Temp:  [97.7 F (36.5 C)-97.8 F (36.6 C)] 97.8 F (36.6 C) (03/03 0505) Pulse Rate:  [89-99] 96  (03/03 0505) Resp:  [16-20] 16  (03/03 0505) BP: (130-166)/(73-93) 131/73 mmHg (03/03 0505) SpO2:  [99 %-100 %] 100 % (03/03 0505) Weight change:  Last BM Date: 05/31/11  Intake/Output from previous day: 03/02 0701 - 03/03 0700 In: 2833.3  [P.O.:120; I.V.:2413.3; IV Piggyback:300] Out: -  Intake/Output this shift:    General appearance: alert, cooperative and no distress Head: Normocephalic, without obvious abnormality, atraumatic Resp: clear to auscultation bilaterally Cardio: regular rate and rhythm, S1, S2 normal, no murmur, click, rub or gallop GI: soft, ventral hernia noted, slight tenderness present, no masses, BS present. Extremities: left 1st toe is gangrenous. Tender to palpation Pulses: poor pulses LLE Skin: gangrenous left 1st toe Neurologic: Grossly normal  Lab Results:  Basename 06/03/11 0444 06/02/11 0435  WBC 16.5* 14.3*  HGB 9.3* 9.5*  HCT 26.9* 28.0*  PLT 340 314   BMET  Basename 06/03/11 0444 06/02/11 0435  NA 130* 132*  K 4.5 4.3  CL 103 107  CO2 18* 17*  GLUCOSE 131* 114*  BUN 28* 41*  CREATININE 1.46* 1.68*  CALCIUM 8.6 8.7  ALT 8 8    Studies/Results: Dg Chest 2 View  06/02/2011  *RADIOLOGY REPORT*  Clinical Data: Preop hernia repair  CHEST - 2 VIEW  Comparison: Chest radiograph 12/21/2007  Findings: Normal cardiac silhouette with the ectatic aorta.  Lungs are clear.  No effusion, infiltrate, pneumothorax. Degenerative osteophytosis of the thoracic spine.  IMPRESSION: No acute cardiopulmonary process.  Original Report Authenticated By: Suzy Bouchard, M.D.   US Abdomen Complete  06/02/2011  *RADIOLOGY REPORT*  Clinical Data:  Upper abdominal pain, nausea, gallstones  COMPLETE ABDOMINAL ULTRASOUND  Comparison:  CT 05/31/2011  Findings:  Gallbladder:  There are several gallstones in the gallbladder neck ranging in size from 6-10 mm.  There approximately 6 to 8 stones. The gallbladder wall is thickened to 6 mm.  There is  no pericholecystic fluid.  Negative sonographic Murphy's sign.  Common bile duct:  Upper limits of normal at 6 mm  Liver:  No focal lesion identified.  Within normal limits in parenchymal echogenicity.  IVC:  Appears normal.  Pancreas:  No focal abnormality seen.  Spleen:   Normal in size and echogenicity  Right Kidney:  9.7cm in length.  No evidence of hydronephrosis or stones.  Left Kidney:  8.8cm in length.  No evidence of hydronephrosis or stones.  Abdominal aorta:  No aneurysm identified.  IMPRESSION:  Gallstones with gallbladder wall thickening are signs concerning for acute cholecystitis.  Negative sonographic Murphy's sign and no pericholecystic fluid weigh against cholecystitis.  Recommend clinical correlation for acute cholecystitis and consider nuclear medicine HIDA scan for increased specificity if clinical pictures is equivocal.  Original Report Authenticated By: Suzy Bouchard, M.D.    Medications:  Scheduled:    . sodium chloride   Intravenous Once  . amLODipine  10 mg Oral Daily  . aspirin EC  81 mg Oral Daily  . enoxaparin  40 mg Subcutaneous Q24H  . feeding supplement  237 mL Oral TID WC  . nicotine  14 mg Transdermal Daily  . pantoprazole  40 mg Oral Q1200  . piperacillin-tazobactam (ZOSYN)  IV  3.375 g Intravenous Q8H  . vancomycin  750 mg Intravenous Q24H  . 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 She has severe vascular disease based on the arterial dopplers. Seen by Dr. Kellie Simmering with Vascular who recommends arteriogram and amputation as stated in his note. Plan to transfer to [facility] today to undergo the test on Monday.. Continue broad spectrum antibiotics for now (Vanc and Zosyn initiated 2/28).  2-Acute on Chronic kidney disease (CKD), (stage III) with Non AG Metabolic  Acidosis: Secondary to prerenal azotemia most likely due to volume contraction. Baseline creatinine not clearly known. Last known labs in our system from 2010 (2.7 in May 2010). Continue IVF with Bicarb. Improvement in creat and bicarb noted. Changed to 1/2 NS with bicarb due to hyponatremia.  4-Abnormal UA: Urine cultures are negative.    5-Abdominal pain, Nausea & vomiting Secondary to Cholecystitis and Cholelithiasis: Lipase was only mildly elevated which could have been from the nausea and vomiting. Seen by Dr. Excell Seltzer who thinks she needs eventual cholecystectomy along with hernia repair possibly during this hospitalization.  6-ANEMIA-NOS: Hgb improved post transfusion. Anemia panel suggests anemia due to chronic disease. No overt bleeding noted. FOBT pending. Continue to monitor CBC's.  7-Essential hypertension, benign: BP reasonable well controlled on Amlodipine 10mg  daily. PRN hydralazine for elevated BP.  8-Hyponatremia: Most likely secondary to dehydration and decreased by mouth intake. Change IVF.  9-Tobacco abuse: A smoking cessation counseling has been provided and nicotine patch has been ordered.   10- Abdominal Ventral hernia: Patient with chronic abdominal hernia that at this point is not incarcerated/strangulated; will continue monitoring. Will need repair per Surgeon.   11-DVT Prophylaxis: lovenox will be held for procedure tomorrow. Please re-address at that time.  12-Malnutrition: Dietitian following. Ensure.  Full Code  To be transferred to [facility]  today.    LOS: 3 days   [facility] Pager [phone] 06/03/2011, 8:36 AM

## 2011-06-03 NOTE — Progress Notes (Signed)
Patient ID: Stephanie Griffith, female   DOB: October 05, 1945, 66 y.o.   MRN: [id number]    Subjective: Foot pain only complaint.  Denies abdominal pain or nausea. Tolerating full liquid diet  Objective: Vital signs in last 24 hours: Temp:  [97.7 F (36.5 C)-97.8 F (36.6 C)] 97.8 F (36.6 C) (03/03 0505) Pulse Rate:  [89-99] 96  (03/03 0505) Resp:  [16-20] 16  (03/03 0505) BP: (130-166)/(73-93) 131/73 mmHg (03/03 0505) SpO2:  [99 %-100 %] 100 % (03/03 0505) Last BM Date: 05/31/11  Intake/Output from previous day: 03/02 0701 - 03/03 0700 In: 2833.3 [P.O.:120; I.V.:2413.3; IV Piggyback:300] Out: -  Intake/Output this shift:    General appearance: fatigued and no distress GI: normal findings: soft, non-tender, hernia soft  Lab Results:   Basename 06/03/11 0444 06/02/11 0435  WBC 16.5* 14.3*  HGB 9.3* 9.5*  HCT 26.9* 28.0*  PLT 340 314   BMET  Basename 06/03/11 0444 06/02/11 0435  NA 130* 132*  K 4.5 4.3  CL 103 107  CO2 18* 17*  GLUCOSE 131* 114*  BUN 28* 41*  CREATININE 1.46* 1.68*  CALCIUM 8.6 8.7     Studies/Results: Dg Chest 2 View  06/02/2011  *RADIOLOGY REPORT*  Clinical Data: Preop hernia repair  CHEST - 2 VIEW  Comparison: Chest radiograph 12/21/2007  Findings: Normal cardiac silhouette with the ectatic aorta.  Lungs are clear.  No effusion, infiltrate, pneumothorax. Degenerative osteophytosis of the thoracic spine.  IMPRESSION: No acute cardiopulmonary process.  Original Report Authenticated By: Suzy Bouchard, M.D.   US Abdomen Complete  06/02/2011  *RADIOLOGY REPORT*  Clinical Data:  Upper abdominal pain, nausea, gallstones  COMPLETE ABDOMINAL ULTRASOUND  Comparison:  CT 05/31/2011  Findings:  Gallbladder:  There are several gallstones in the gallbladder neck ranging in size from 6-10 mm.  There approximately 6 to 8 stones. The gallbladder wall is thickened to 6 mm.  There is no pericholecystic fluid.  Negative sonographic Murphy's sign.  Common bile duct:  Upper limits  of normal at 6 mm  Liver:  No focal lesion identified.  Within normal limits in parenchymal echogenicity.  IVC:  Appears normal.  Pancreas:  No focal abnormality seen.  Spleen:  Normal in size and echogenicity  Right Kidney:  9.7cm in length.  No evidence of hydronephrosis or stones.  Left Kidney:  8.8cm in length.  No evidence of hydronephrosis or stones.  Abdominal aorta:  No aneurysm identified.  IMPRESSION:  Gallstones with gallbladder wall thickening are signs concerning for acute cholecystitis.  Negative sonographic Murphy's sign and no pericholecystic fluid weigh against cholecystitis.  Recommend clinical correlation for acute cholecystitis and consider nuclear medicine HIDA scan for increased specificity if clinical pictures is equivocal.  Original Report Authenticated By: Suzy Bouchard, M.D.    Anti-infectives: Anti-infectives     Start     Dose/Rate Route Frequency Ordered Stop   06/01/11 2200   vancomycin (VANCOCIN) 750 mg in sodium chloride 0.9 % 150 mL IVPB        750 mg 150 mL/hr over 60 Minutes Intravenous Every 24 hours 05/31/11 1744     06/01/11 0200  piperacillin-tazobactam (ZOSYN) IVPB 3.375 g       3.375 g 12.5 mL/hr over 240 Minutes Intravenous 3 times per day 05/31/11 1744     05/31/11 1800   vancomycin (VANCOCIN) IVPB 1000 mg/200 mL premix        1,000 mg 200 mL/hr over 60 Minutes Intravenous  Once 05/31/11 1744 05/31/11 1928  05/31/11 1800  piperacillin-tazobactam (ZOSYN) IVPB 3.375 g       3.375 g 100 mL/hr over 30 Minutes Intravenous  Once 05/31/11 1744 05/31/11 1829          Assessment/Plan: s/p Procedure(s): LOWER EXTREMITY ANGIOGRAM  PVD and gangrene foot Cholelithiasis and ventral hernia with episode of abdominal pain, resolved WBC up, likely from foot, abdomen seems benign.  Will follow at [facility] after transfer   LOS: 3 days    Adama Ferber T 06/03/2011

## 2011-06-04 ENCOUNTER — Encounter (HOSPITAL_COMMUNITY)
Admission: EM | Disposition: A | Discharge status: Other Acute Inpatient Hospital | Payer: Self-pay | Source: Home / Self Care | Attending: Vascular Surgery

## 2011-06-04 DIAGNOSIS — R109 Unspecified abdominal pain: Secondary | ICD-10-CM

## 2011-06-04 DIAGNOSIS — I70219 Atherosclerosis of native arteries of extremities with intermittent claudication, unspecified extremity: Secondary | ICD-10-CM

## 2011-06-04 DIAGNOSIS — K8020 Calculus of gallbladder without cholecystitis without obstruction: Secondary | ICD-10-CM

## 2011-06-04 DIAGNOSIS — K439 Ventral hernia without obstruction or gangrene: Secondary | ICD-10-CM

## 2011-06-04 DIAGNOSIS — Z01810 Encounter for preprocedural cardiovascular examination: Secondary | ICD-10-CM

## 2011-06-04 HISTORY — PX: LOWER EXTREMITY ANGIOGRAM: SHX5508

## 2011-06-04 LAB — CBC
HCT: 27.3 % — ABNORMAL LOW (ref 36.0–46.0)
HCT: 28.4 % — ABNORMAL LOW (ref 36.0–46.0)
Hemoglobin: 9.2 g/dL — ABNORMAL LOW (ref 12.0–15.0)
Hemoglobin: 9.7 g/dL — ABNORMAL LOW (ref 12.0–15.0)
MCH: 29.4 pg (ref 26.0–34.0)
MCH: 29.8 pg (ref 26.0–34.0)
MCHC: 33.7 g/dL (ref 30.0–36.0)
MCHC: 34.2 g/dL (ref 30.0–36.0)
MCV: 87.2 fL (ref 78.0–100.0)
MCV: 87.1 fL (ref 78.0–100.0)
Platelets: 342 10*3/uL (ref 150–400)
Platelets: 364 10*3/uL (ref 150–400)
RBC: 3.13 MIL/uL — ABNORMAL LOW (ref 3.87–5.11)
RBC: 3.26 MIL/uL — ABNORMAL LOW (ref 3.87–5.11)
RDW: 14.4 % (ref 11.5–15.5)
RDW: 14.2 % (ref 11.5–15.5)
WBC: 14.6 10*3/uL — ABNORMAL HIGH (ref 4.0–10.5)
WBC: 15 10*3/uL — ABNORMAL HIGH (ref 4.0–10.5)

## 2011-06-04 LAB — PROTIME-INR
INR: 1.13 (ref 0.00–1.49)
Prothrombin Time: 14.7 seconds (ref 11.6–15.2)

## 2011-06-04 LAB — BASIC METABOLIC PANEL
BUN: 23 mg/dL (ref 6–23)
CO2: 22 mEq/L (ref 19–32)
Calcium: 9.2 mg/dL (ref 8.4–10.5)
Chloride: 104 mEq/L (ref 96–112)
Creatinine, Ser: 1.51 mg/dL — ABNORMAL HIGH (ref 0.50–1.10)
GFR calc Af Amer: 40 mL/min — ABNORMAL LOW (ref >90)
GFR calc non Af Amer: 35 mL/min — ABNORMAL LOW (ref >90)
Glucose, Bld: 102 mg/dL — ABNORMAL HIGH (ref 70–99)
Potassium: 4.3 mEq/L (ref 3.5–5.1)
Sodium: 135 mEq/L (ref 135–145)

## 2011-06-04 LAB — COMPREHENSIVE METABOLIC PANEL
ALT: 11 U/L (ref 0–35)
AST: 36 U/L (ref 0–37)
Albumin: 2.6 g/dL — ABNORMAL LOW (ref 3.5–5.2)
Alkaline Phosphatase: 96 U/L (ref 39–117)
BUN: 23 mg/dL (ref 6–23)
CO2: 21 mEq/L (ref 19–32)
Calcium: 9.1 mg/dL (ref 8.4–10.5)
Chloride: 102 mEq/L (ref 96–112)
Creatinine, Ser: 1.51 mg/dL — ABNORMAL HIGH (ref 0.50–1.10)
GFR calc Af Amer: 40 mL/min — ABNORMAL LOW (ref >90)
GFR calc non Af Amer: 35 mL/min — ABNORMAL LOW (ref >90)
Glucose, Bld: 108 mg/dL — ABNORMAL HIGH (ref 70–99)
Potassium: 4.1 mEq/L (ref 3.5–5.1)
Sodium: 133 mEq/L — ABNORMAL LOW (ref 135–145)
Total Bilirubin: 0.3 mg/dL (ref 0.3–1.2)
Total Protein: 6.7 g/dL (ref 6.0–8.3)

## 2011-06-04 LAB — APTT: aPTT: 33 seconds (ref 24–37)

## 2011-06-04 SURGERY — ANGIOGRAM, LOWER EXTREMITY
Anesthesia: LOCAL

## 2011-06-04 MED ORDER — HEPARIN (PORCINE) IN NACL 2-0.9 UNIT/ML-% IJ SOLN
INTRAMUSCULAR | Status: AC
  Filled 2011-06-04: qty 1000

## 2011-06-04 MED ORDER — ONDANSETRON HCL 4 MG/2ML IJ SOLN: 4.0000 mg | Freq: Four times a day (QID) | INTRAMUSCULAR | Status: DC | PRN

## 2011-06-04 MED ORDER — ASPIRIN EC 325 MG PO TBEC
325.0000 mg | DELAYED_RELEASE_TABLET | Freq: Every day | ORAL | Status: DC
  Administered 2011-06-04 – 2011-06-05 (×2): 325 mg via ORAL
  Filled 2011-06-04 (×3): qty 1

## 2011-06-04 MED ORDER — ENOXAPARIN SODIUM 40 MG/0.4ML ~~LOC~~ SOLN
40.0000 mg | Freq: Every day | SUBCUTANEOUS | Status: DC
  Administered 2011-06-05 (×2): 40 mg via SUBCUTANEOUS
  Filled 2011-06-04 (×3): qty 0.4

## 2011-06-04 MED ORDER — ACETAMINOPHEN 325 MG PO TABS: 650.0000 mg | ORAL_TABLET | ORAL | Status: DC | PRN

## 2011-06-04 MED ORDER — MIDAZOLAM HCL 2 MG/2ML IJ SOLN
INTRAMUSCULAR | Status: AC
  Filled 2011-06-04: qty 2

## 2011-06-04 MED ORDER — OXYCODONE-ACETAMINOPHEN 5-325 MG PO TABS
1.0000 | ORAL_TABLET | ORAL | Status: DC | PRN
  Administered 2011-06-04 – 2011-06-06 (×9): 2 via ORAL
  Filled 2011-06-04 (×8): qty 2

## 2011-06-04 MED ORDER — OXYCODONE-ACETAMINOPHEN 5-325 MG PO TABS
ORAL_TABLET | ORAL | Status: AC
  Filled 2011-06-04: qty 2

## 2011-06-04 MED ORDER — HYDRALAZINE HCL 20 MG/ML IJ SOLN
INTRAMUSCULAR | Status: AC
  Filled 2011-06-04: qty 1

## 2011-06-04 MED ORDER — LABETALOL HCL 5 MG/ML IV SOLN
INTRAVENOUS | Status: AC
  Filled 2011-06-04: qty 4

## 2011-06-04 MED ORDER — LIDOCAINE HCL (PF) 1 % IJ SOLN
INTRAMUSCULAR | Status: AC
  Filled 2011-06-04: qty 30

## 2011-06-04 MED ORDER — FENTANYL CITRATE 0.05 MG/ML IJ SOLN
INTRAMUSCULAR | Status: AC
  Filled 2011-06-04: qty 2

## 2011-06-04 NOTE — Progress Notes (Signed)
Patient Rt groin with out hematoma or bleeding, post procedure vital signs done patient SR on monitor rate 90 Blood pressure 139/70 will monitor patient. Brynne Doane, Bettina Gavia RN

## 2011-06-04 NOTE — Consult Note (Signed)
Pt arteriogram reviewed.  Left SFA occ., patent below knee pop with 1 vessel peroneal runoff.  She has gangrene of left first toe.  Will plan left fem pop on Wed 06/06/11.  Procedure discussed with pt, daughter and son in law  Risks: bleeding infection, limb loss, myocardial events, worsening renal function Benefits: hopeful limb salvage  Pt wishes to proceed Will amputate toe at time of bypass Will get vein mapping of leg today or tomorrow.  [name], MD [facility] Office: [phone] Pager: [phone]

## 2011-06-04 NOTE — Progress Notes (Signed)
Patient ID: Stephanie Griffith, female   DOB: 1945-05-17, 66 y.o.   MRN: [id number] Day of Surgery  Subjective: Pt c/o pain in her foot.  No abdominal pain.  Tolerating diet.  Objective: Vital signs in last 24 hours: Temp:  [97.9 F (36.6 C)-98 F (36.7 C)] 97.9 F (36.6 C) (03/04 0500) Pulse Rate:  [92-108] 108  (03/04 0500) Resp:  [15-18] 15  (03/04 0500) BP: (148-171)/(76-84) 151/76 mmHg (03/04 0500) SpO2:  [98 %-100 %] 98 % (03/04 0500) Weight:  [201 lb 11.5 oz (91.5 kg)] 201 lb 11.5 oz (91.5 kg) (03/03 1207) Last BM Date: 06/01/11  Intake/Output from previous day: 03/03 0701 - 03/04 0700 In: 120 [P.O.:120] Out: -  Intake/Output this shift:    PE: Abd: soft, NT, ND, BS, ventral hernia partially reducible  Lab Results:   Basename 06/04/11 0605 06/03/11 0444  WBC 14.6* 16.5*  HGB 9.2* 9.3*  HCT 27.3* 26.9*  PLT 342 340   BMET  Basename 06/04/11 0605 06/03/11 0444  NA 135 130*  K 4.3 4.5  CL 104 103  CO2 22 18*  GLUCOSE 102* 131*  BUN 23 28*  CREATININE 1.51* 1.46*  CALCIUM 9.2 8.6   PT/INR  Basename 06/04/11 0605  LABPROT 14.7  INR 1.13     Studies/Results: No results found.  Anti-infectives: Anti-infectives     Start     Dose/Rate Route Frequency Ordered Stop   06/03/11 1800   vancomycin (VANCOCIN) 750 mg in sodium chloride 0.9 % 150 mL IVPB        750 mg 150 mL/hr over 60 Minutes Intravenous Every 12 hours 06/03/11 1608     06/03/11 1400   vancomycin (VANCOCIN) 750 mg in sodium chloride 0.9 % 150 mL IVPB  Status:  Discontinued        750 mg 150 mL/hr over 60 Minutes Intravenous Every 12 hours 06/03/11 1336 06/03/11 1608   06/01/11 2200   vancomycin (VANCOCIN) 750 mg in sodium chloride 0.9 % 150 mL IVPB  Status:  Discontinued        750 mg 150 mL/hr over 60 Minutes Intravenous Every 24 hours 05/31/11 1744 06/03/11 1336   06/01/11 0200  piperacillin-tazobactam (ZOSYN) IVPB 3.375 g       3.375 g 12.5 mL/hr over 240 Minutes Intravenous 3 times per  day 05/31/11 1744     05/31/11 1800   vancomycin (VANCOCIN) IVPB 1000 mg/200 mL premix        1,000 mg 200 mL/hr over 60 Minutes Intravenous  Once 05/31/11 1744 05/31/11 1928   05/31/11 1800  piperacillin-tazobactam (ZOSYN) IVPB 3.375 g       3.375 g 100 mL/hr over 30 Minutes Intravenous  Once 05/31/11 1744 05/31/11 1829           Assessment/Plan  1. Cholelithiasis 2. Partially reducible ventral hernia 3. Dry gangrene  Plan: 1. Patient no longer symptomatic from gallstones or hernia.  She likely had an episode of biliary colic that has resolved.   2.  Her hernia is partially reducible and no causing any problems or obstruction. 3. She may follow up with [place] in our office for elective repair and cholecystectomy. 4. Please call if needed.   LOS: 4 days    Myriam Brandhorst E 06/04/2011

## 2011-06-04 NOTE — Progress Notes (Signed)
Patient is due to have bypass surgery on leg Wednesday.  Would NOT consider Hernia/GB surgery during this admission unless the patient became much more symptomatic.  Should follow up with [place] as outpatient  Kathryne Eriksson. Dahlia Bailiff, MD, [place] [phone] [facility]

## 2011-06-04 NOTE — Progress Notes (Signed)
Utilization review completed.  

## 2011-06-04 NOTE — H&P (View-Only) (Signed)
Patient ID: Stephanie Griffith, female   DOB: [birth year], 66 y.o.   MRN: [id number] Vascular Surgery Progress Note  Subjective: Gangrene left first toe secondary to peripheral vascular disease  Objective:  Filed Vitals:   06/02/11 1413  BP: 166/93  Pulse: 99  Temp: 97.7 F (36.5 C)  Resp: 18    Left first toe unchanged on physical exam. There is dry gangrene distally. There some darkening of the skin medially with no evidence of cellulitis or fluctuance Creatinine has improved to 1.68 with BUN of 41 down from 2.97 48 hours ago   Labs:  Lab 06/02/11 0435 06/01/11 0340 05/31/11 1100  CREATININE 1.68* 2.40* 2.97*    Lab 06/02/11 0435 06/01/11 0340 05/31/11 1100  NA 132* 131* 128*  K 4.3 4.4 4.3  CL 107 106 97  CO2 17* 14* 14*  BUN 41* 67* 84*  CREATININE 1.68* 2.40* 2.97*  LABGLOM -- -- --  GLUCOSE 114* -- --  CALCIUM 8.7 8.5 9.3    Lab 06/02/11 0435 06/01/11 1958 06/01/11 0340  WBC 14.3* 14.3* 13.8*  HGB 9.5* 9.8* 6.8*  HCT 28.0* 29.0* 20.3*  PLT 314 347 349   No results found for this basename: INR:3 in the last 168 hours  I/O last 3 completed shifts: In: 2925 [P.O.:600; I.V.:1520; Blood:555; IV Piggyback:250] Out: 1950 [Urine:1950]  Imaging: Dg Chest 2 View  06/02/2011  *RADIOLOGY REPORT*  Clinical Data: Preop hernia repair  CHEST - 2 VIEW  Comparison: Chest radiograph 12/21/2007  Findings: Normal cardiac silhouette with the ectatic aorta.  Lungs are clear.  No effusion, infiltrate, pneumothorax. Degenerative osteophytosis of the thoracic spine.  IMPRESSION: No acute cardiopulmonary process.  Original Report Authenticated By: Suzy Bouchard, M.D.   US Abdomen Complete  06/02/2011  *RADIOLOGY REPORT*  Clinical Data:  Upper abdominal pain, nausea, gallstones  COMPLETE ABDOMINAL ULTRASOUND  Comparison:  CT 05/31/2011  Findings:  Gallbladder:  There are several gallstones in the gallbladder neck ranging in size from 6-10 mm.  There approximately 6 to 8 stones. The gallbladder  wall is thickened to 6 mm.  There is no pericholecystic fluid.  Negative sonographic Murphy's sign.  Common bile duct:  Upper limits of normal at 6 mm  Liver:  No focal lesion identified.  Within normal limits in parenchymal echogenicity.  IVC:  Appears normal.  Pancreas:  No focal abnormality seen.  Spleen:  Normal in size and echogenicity  Right Kidney:  9.7cm in length.  No evidence of hydronephrosis or stones.  Left Kidney:  8.8cm in length.  No evidence of hydronephrosis or stones.  Abdominal aorta:  No aneurysm identified.  IMPRESSION:  Gallstones with gallbladder wall thickening are signs concerning for acute cholecystitis.  Negative sonographic Murphy's sign and no pericholecystic fluid weigh against cholecystitis.  Recommend clinical correlation for acute cholecystitis and consider nuclear medicine HIDA scan for increased specificity if clinical pictures is equivocal.  Original Report Authenticated By: Suzy Bouchard, M.D.    Assessment/Plan Plan transfer to [facility] tomorrow. We'll plan angiography by Dr. Doren Custard on Monday with intervention if feasible. If not and she is candidate for bypass we'll then proceed with lower extremity bypass to be followed by partial foot amputation   Tinnie Gens, MD 06/02/2011 9:29 PM

## 2011-06-04 NOTE — Progress Notes (Addendum)
Subjective:  Afebrile overnight  Objective: Vital signs in last 24 hours: Filed Vitals:   06/03/11 0505 06/03/11 1207 06/03/11 1800 06/04/11 0500  BP: 131/73 148/79 171/84 151/76  Pulse: 96 92 108 108  Temp: 97.8 F (36.6 C) 98 F (36.7 C) 98 F (36.7 C) 97.9 F (36.6 C)  TempSrc: Oral Oral Oral Oral  Resp: 16 18 16 15   Height:  5\' 8"  (1.727 m)    Weight:  91.5 kg (201 lb 11.5 oz)    SpO2: 100% 100% 100% 98%    Intake/Output Summary (Last 24 hours) at 06/04/11 0756 Last data filed at 06/03/11 0930  Gross per 24 hour  Intake    120 ml  Output      0 ml  Net    120 ml    Weight change:   General appearance: alert, cooperative and no distress  Head: Normocephalic, without obvious abnormality, atraumatic  Resp: clear to auscultation bilaterally  Cardio: regular rate and rhythm, S1, S2 normal, no murmur, click, rub or gallop  GI: soft, ventral hernia noted, slight tenderness present, no masses, BS present.  Extremities: left 1st toe is gangrenous. Tender to palpation  Pulses: poor pulses LLE  Skin: gangrenous left 1st toe  Neurologic: Grossly normal      Lab Results: Results for orders placed during the hospital encounter of 05/31/11 (from the past 24 hour(s))  BASIC METABOLIC PANEL     Status: Abnormal   Collection Time   06/04/11  6:05 AM      Component Value Range   Sodium 135  135 - 145 (mEq/L)   Potassium 4.3  3.5 - 5.1 (mEq/L)   Chloride 104  96 - 112 (mEq/L)   CO2 22  19 - 32 (mEq/L)   Glucose, Bld 102 (*) 70 - 99 (mg/dL)   BUN 23  6 - 23 (mg/dL)   Creatinine, Ser 1.51 (*) 0.50 - 1.10 (mg/dL)   Calcium 9.2  8.4 - 10.5 (mg/dL)   GFR calc non Af Amer 35 (*) >90 (mL/min)   GFR calc Af Amer 40 (*) >90 (mL/min)  CBC     Status: Abnormal   Collection Time   06/04/11  6:05 AM      Component Value Range   WBC 14.6 (*) 4.0 - 10.5 (K/uL)   RBC 3.13 (*) 3.87 - 5.11 (MIL/uL)   Hemoglobin 9.2 (*) 12.0 - 15.0 (g/dL)   HCT 27.3 (*) 36.0 - 46.0 (%)   MCV 87.2  78.0  - 100.0 (fL)   MCH 29.4  26.0 - 34.0 (pg)   MCHC 33.7  30.0 - 36.0 (g/dL)   RDW 14.4  11.5 - 15.5 (%)   Platelets 342  150 - 400 (K/uL)  PROTIME-INR     Status: Normal   Collection Time   06/04/11  6:05 AM      Component Value Range   Prothrombin Time 14.7  11.6 - 15.2 (seconds)   INR 1.13  0.00 - 1.49   APTT     Status: Normal   Collection Time   06/04/11  6:05 AM      Component Value Range   aPTT 33  24 - 37 (seconds)     Micro: Recent Results (from the past 240 hour(s))  URINE CULTURE     Status: Normal   Collection Time   05/31/11  3:22 PM      Component Value Range Status Comment   Specimen Description URINE, [place]  Final    Special Requests NONE   Final    Culture  Setup Time [id number]   Final    Colony Count NO GROWTH   Final    Culture NO GROWTH   Final    Report Status 06/01/2011 FINAL   Final     Studies/Results: Dg Chest 2 View  06/02/2011  *RADIOLOGY REPORT*  Clinical Data: Preop hernia repair  CHEST - 2 VIEW  Comparison: Chest radiograph 12/21/2007  Findings: Normal cardiac silhouette with the ectatic aorta.  Lungs are clear.  No effusion, infiltrate, pneumothorax. Degenerative osteophytosis of the thoracic spine.  IMPRESSION: No acute cardiopulmonary process.  Original Report Authenticated By: Suzy Bouchard, M.D.   US Abdomen Complete  06/02/2011  *RADIOLOGY REPORT*  Clinical Data:  Upper abdominal pain, nausea, gallstones  COMPLETE ABDOMINAL ULTRASOUND  Comparison:  CT 05/31/2011  Findings:  Gallbladder:  There are several gallstones in the gallbladder neck ranging in size from 6-10 mm.  There approximately 6 to 8 stones. The gallbladder wall is thickened to 6 mm.  There is no pericholecystic fluid.  Negative sonographic Murphy's sign.  Common bile duct:  Upper limits of normal at 6 mm  Liver:  No focal lesion identified.  Within normal limits in parenchymal echogenicity.  IVC:  Appears normal.  Pancreas:  No focal abnormality seen.  Spleen:  Normal in size  and echogenicity  Right Kidney:  9.7cm in length.  No evidence of hydronephrosis or stones.  Left Kidney:  8.8cm in length.  No evidence of hydronephrosis or stones.  Abdominal aorta:  No aneurysm identified.  IMPRESSION:  Gallstones with gallbladder wall thickening are signs concerning for acute cholecystitis.  Negative sonographic Murphy's sign and no pericholecystic fluid weigh against cholecystitis.  Recommend clinical correlation for acute cholecystitis and consider nuclear medicine HIDA scan for increased specificity if clinical pictures is equivocal.  Original Report Authenticated By: Suzy Bouchard, M.D.    Medications:  Scheduled Meds:   . amLODipine  10 mg Oral Daily  . aspirin EC  81 mg Oral Daily  . feeding supplement  237 mL Oral TID WC  . nicotine  14 mg Transdermal Daily  . pantoprazole  40 mg Oral Q1200  . piperacillin-tazobactam (ZOSYN)  IV  3.375 g Intravenous Q8H  . vancomycin  750 mg Intravenous Q12H  . DISCONTD: sodium chloride   Intravenous Once  . DISCONTD: acetylcysteine  1 mL/kg Oral BID  . DISCONTD: enoxaparin  40 mg Subcutaneous Q24H  . DISCONTD: vancomycin  750 mg Intravenous Q24H  . DISCONTD: vancomycin  750 mg Intravenous Q12H   Continuous Infusions:   .  sodium bicarbonate infusion 1021ml 100 mL/hr at 06/04/11 0426  . DISCONTD:  sodium bicarbonate infusion 1086ml 100 mL/hr at 06/02/11 1359   PRN Meds:.acetaminophen, acetaminophen, hydrALAZINE, morphine, ondansetron (ZOFRAN) IV, ondansetron, oxyCODONE, polyethylene glycol   Assessment: Principal Problem:  *Dry gangrene Active Problems:  ANEMIA-NOS  Essential hypertension, benign  Chronic kidney disease (CKD), stage III (moderate)  Abdominal pain  Leukocytosis  Hyponatremia  Hyperglycemia  Acute on chronic renal failure  PAD (peripheral artery disease)  Cholelithiasis and cholecystitis without obstruction  Ventral hernia   Plan: 1-Dry gangrene Left 1st Toe: With most likely superimposed  cellulitis with Leukocytosis. She has severe vascular disease based on the arterial dopplers. Seen by Dr. Kellie Simmering with Vascular who recommends arteriogram and amputation as stated in his note.  Continue broad spectrum antibiotics for now (Vanc and Zosyn initiated 2/28). LOWER EXTREMITY ANGIOGRAMby Dr. Judeth Cornfield  Scot Dock, MD  2-Acute on Chronic kidney disease (CKD), (stage III) with Non AG Metabolic Acidosis: Secondary to prerenal azotemia most likely due to volume contraction. Baseline creatinine not clearly known. Last known labs in our system from 2010 (2.7 in May 2010). Continue IVF with Bicarb. Improvement in creat and bicarb noted. Changed to 1/2 NS with bicarb due to hyponatremia.  4-Abnormal UA: Urine cultures are negative.  5-Abdominal pain, Nausea & vomiting Secondary to Cholecystitis and Cholelithiasis: Lipase was only mildly elevated which could have been from the nausea and vomiting. Seen by Dr. Excell Seltzer who thinks she needs eventual cholecystectomy along with hernia repair possibly during this hospitalization.  6-ANEMIA-NOS: Hgb improved post transfusion. Anemia panel suggests anemia due to chronic disease. No overt bleeding noted. FOBT pending. Continue to monitor CBC's.  7-Essential hypertension, benign: BP reasonable well controlled on Amlodipine 10mg  daily. PRN hydralazine for elevated BP.  8-Hyponatremia: Most likely secondary to dehydration and decreased by mouth intake. Change IVF.  9-Tobacco abuse: A smoking cessation counseling has been provided and nicotine patch has been ordered.  10- Abdominal Ventral hernia: Patient with chronic abdominal hernia that at this point is not incarcerated/strangulated; will continue monitoring. Will need repair per Surgeon.  11-DVT Prophylaxis: lovenox will be held for procedure tomorrow. Please re-address at that time.  12-Malnutrition: Dietitian following. Ensure.  Full Code    LOS: 4 days   [facility] 06/04/2011, 7:56 AM

## 2011-06-04 NOTE — Op Note (Signed)
PATIENT: Stephanie Griffith   MRN: [id number] DOB: 1945/04/06    DATE OF PROCEDURE: 06/04/2011  INDICATIONS: Stephanie Griffith is a 66 y.o. female who presented with dry gangrene of her left great toe. She was seen in consultation by Dr. Kellie Simmering and found to have evidence of significant infrainguinal arterial occlusive disease. She had significant renal insufficiency but her creatinine improved with hydration. I was asked to perform CO2 arteriography and then arteriogram with limited dye to evaluate for potential revascularization of the left lower extremity.  PROCEDURE:  1. Ultrasound-guided access to the right common femoral artery 2. A CO2 aortogram an iliac arteriogram 3. Selective catheterization of the left external iliac artery with left lower from a runoff. (Done with CO2 and limited contrast-30 cc Visipaque)  SURGEON: Judeth Cornfield. Scot Dock, MD, FACS  ANESTHESIA: local with sedation   EBL: minimal  TECHNIQUE: The patient was brought to the PV lab and sedated with a milligram of Versed and 50 mcg of fentanyl. Both groins were prepped and draped in the usual sterile fashion. After the skin was infiltrated with 1% lidocaine, and under ultrasound guidance, the right common femoral artery was cannulated and a guidewire introduced into the infrarenal aorta under fluoroscopic control. A 5 French sheath was introduced over the wire. The pigtail catheter was positioned at the L1 vertebral body and flush aortogram obtained. The catheter was then positioned above the aortic bite furcation and the pigtail catheter was exchanged for a crossover catheter. This was positioned into the left common iliac artery. An angled Glidewire was advanced into the external iliac artery and the crossover catheter was exchanged for an endhole catheter. Selective left external iliac arteriogram was obtained using CO2 with left lower any runoff. 2 spot films were obtained using a total of 30 cc of contrast for better visualization of the  tibial vessels. The endhole catheter was then removed over a wire. CO2 arteriogram was obtained of the right lower extremity through CO2 injections through the right femoral sheath. At the completion the sheath was removed pressure held for hemostasis and no immediate complications were noted.  FINDINGS:  1. Infrarenal aortic, bilateral common iliac arteries, and bilateral external iliac arteries, and bilateral hypogastric arteries are patent. 2. On the left side, there is a moderate stenosis in the proximal superficial femoral artery. There is moderate disease at the adductor canal which then occludes with reconstitution of a small above-knee popliteal artery. This him mild to moderate disease of the pop 2 artery behind the knee. The below-knee pop artery is patent although small. This single arterial runoff on the left via the anterior tibial artery. The peroneal and posterior tibial arteries are occluded. 3. On the right side, the common femoral, superficial femoral, deep femoral,, popliteal, and proximal tibial vessels are patent. His poor visualization distally because CO2 was used and I did not want to use any more contrast.   Deitra Mayo, MD, FACS [facility]  DATE OF DICTATION:   06/04/2011

## 2011-06-04 NOTE — Interval H&P Note (Signed)
History and Physical Interval Note:  06/04/2011 8:34 AM  Stephanie Griffith  has presented today for surgery, with the diagnosis of pvd  The various methods of treatment have been discussed with the patient and family. After consideration of risks, benefits and other options for treatment, the patient has consented to: [place].  The patients' history has been reviewed, patient examined, no change in status, stable for surgery.  I have reviewed the patients' chart and labs.  Questions were answered to the patient's satisfaction.     [name] S

## 2011-06-04 NOTE — Progress Notes (Signed)
VASCULAR LAB PRELIMINARY  PRELIMINARY  PRELIMINARY  PRELIMINARY  Right Lower Extremity Vein Map    Right Great Saphenous Vein   Segment Diameter Comment  1. Origin 6.38mm   2. High Thigh 3.70mm   3. Mid Thigh 3.35mm   4. Low Thigh 4.29mm   5. At Knee 3.41mm   6. High Calf 2.63mm   7. Low Calf  Unable to visualize well enough to evaluate  8. Ankle  Unable to visualize well enough to evaluate       mm    mm                          mm    mm    mm   Left Lower Extremity Vein Map    Left Great Saphenous Vein   Segment Diameter Comment  1. Origin 5.59mm   2. High Thigh 2.13mm   3. Mid Thigh 2.37mm   4. Low Thigh 2.21mm   5. At Knee 2.38mm   6. High Calf 2.26mm   7. Low Calf 2.71mm   8. Ankle 2.47mm            mm                                        Floetta Brickey D, RVS 06/04/2011, 5:35 PM

## 2011-06-05 LAB — VANCOMYCIN, TROUGH: Vancomycin Tr: 30.2 ug/mL (ref 10.0–20.0)

## 2011-06-05 LAB — BASIC METABOLIC PANEL
BUN: 25 mg/dL — ABNORMAL HIGH (ref 6–23)
CO2: 23 mEq/L (ref 19–32)
Calcium: 9.2 mg/dL (ref 8.4–10.5)
Chloride: 104 mEq/L (ref 96–112)
Creatinine, Ser: 1.58 mg/dL — ABNORMAL HIGH (ref 0.50–1.10)
GFR calc Af Amer: 38 mL/min — ABNORMAL LOW (ref >90)
GFR calc non Af Amer: 33 mL/min — ABNORMAL LOW (ref >90)
Glucose, Bld: 116 mg/dL — ABNORMAL HIGH (ref 70–99)
Potassium: 4.7 mEq/L (ref 3.5–5.1)
Sodium: 134 mEq/L — ABNORMAL LOW (ref 135–145)

## 2011-06-05 LAB — CBC
HCT: 28.8 % — ABNORMAL LOW (ref 36.0–46.0)
Hemoglobin: 9.7 g/dL — ABNORMAL LOW (ref 12.0–15.0)
MCH: 29.7 pg (ref 26.0–34.0)
MCHC: 33.7 g/dL (ref 30.0–36.0)
MCV: 88.1 fL (ref 78.0–100.0)
Platelets: 378 10*3/uL (ref 150–400)
RBC: 3.27 MIL/uL — ABNORMAL LOW (ref 3.87–5.11)
RDW: 14.4 % (ref 11.5–15.5)
WBC: 13.1 10*3/uL — ABNORMAL HIGH (ref 4.0–10.5)

## 2011-06-05 MED ORDER — FENTANYL CITRATE 0.05 MG/ML IJ SOLN: 50.0000 ug | INTRAMUSCULAR | Status: DC | PRN

## 2011-06-05 MED ORDER — SODIUM CHLORIDE 0.9 % IV SOLN
INTRAVENOUS | Status: DC
  Administered 2011-06-05: 09:00:00 via INTRAVENOUS

## 2011-06-05 MED ORDER — VANCOMYCIN HCL 1000 MG IV SOLR
750.0000 mg | INTRAVENOUS | Status: DC
  Filled 2011-06-05: qty 750

## 2011-06-05 MED ORDER — MIDAZOLAM HCL 2 MG/2ML IJ SOLN: 1.0000 mg | INTRAMUSCULAR | Status: DC | PRN

## 2011-06-05 NOTE — Progress Notes (Signed)
Subjective: Patient is status post angiogram, no bleeding from the right groin  Objective: Vital signs in last 24 hours: Filed Vitals:   06/04/11 1400 06/04/11 1500 06/04/11 2012 06/05/11 0532  BP: 139/70 151/69 132/83 144/82  Pulse: 90 90 92 93  Temp:   98.3 F (36.8 C) 98.4 F (36.9 C)  TempSrc:   Oral Oral  Resp:   18 18  Height:      Weight:      SpO2:   98% 95%    Intake/Output Summary (Last 24 hours) at 06/05/11 0813 Last data filed at 06/05/11 0800  Gross per 24 hour  Intake 4707.08 ml  Output      0 ml  Net 4707.08 ml    Weight change:   General appearance: alert, cooperative and no distress  Head: Normocephalic, without obvious abnormality, atraumatic  Resp: clear to auscultation bilaterally  Cardio: regular rate and rhythm, S1, S2 normal, no murmur, click, rub or gallop  GI: soft, ventral hernia noted, slight tenderness present, no masses, BS present.  Extremities: left 1st toe is gangrenous. Tender to palpation  Pulses: poor pulses LLE  Skin: gangrenous left 1st toe  Neurologic: Grossly normal       Lab Results: Results for orders placed during the hospital encounter of 05/31/11 (from the past 24 hour(s))  COMPREHENSIVE METABOLIC PANEL     Status: Abnormal   Collection Time   06/04/11 12:18 PM      Component Value Range   Sodium 133 (*) 135 - 145 (mEq/L)   Potassium 4.1  3.5 - 5.1 (mEq/L)   Chloride 102  96 - 112 (mEq/L)   CO2 21  19 - 32 (mEq/L)   Glucose, Bld 108 (*) 70 - 99 (mg/dL)   BUN 23  6 - 23 (mg/dL)   Creatinine, Ser 1.51 (*) 0.50 - 1.10 (mg/dL)   Calcium 9.1  8.4 - 10.5 (mg/dL)   Total Protein 6.7  6.0 - 8.3 (g/dL)   Albumin 2.6 (*) 3.5 - 5.2 (g/dL)   AST 36  0 - 37 (U/L)   ALT 11  0 - 35 (U/L)   Alkaline Phosphatase 96  39 - 117 (U/L)   Total Bilirubin 0.3  0.3 - 1.2 (mg/dL)   GFR calc non Af Amer 35 (*) >90 (mL/min)   GFR calc Af Amer 40 (*) >90 (mL/min)  CBC     Status: Abnormal   Collection Time   06/04/11 12:18 PM   Component Value Range   WBC 15.0 (*) 4.0 - 10.5 (K/uL)   RBC 3.26 (*) 3.87 - 5.11 (MIL/uL)   Hemoglobin 9.7 (*) 12.0 - 15.0 (g/dL)   HCT 28.4 (*) 36.0 - 46.0 (%)   MCV 87.1  78.0 - 100.0 (fL)   MCH 29.8  26.0 - 34.0 (pg)   MCHC 34.2  30.0 - 36.0 (g/dL)   RDW 14.2  11.5 - 15.5 (%)   Platelets 364  150 - 400 (K/uL)  CBC     Status: Abnormal   Collection Time   06/05/11  6:20 AM      Component Value Range   WBC 13.1 (*) 4.0 - 10.5 (K/uL)   RBC 3.27 (*) 3.87 - 5.11 (MIL/uL)   Hemoglobin 9.7 (*) 12.0 - 15.0 (g/dL)   HCT 28.8 (*) 36.0 - 46.0 (%)   MCV 88.1  78.0 - 100.0 (fL)   MCH 29.7  26.0 - 34.0 (pg)   MCHC 33.7  30.0 - 36.0 (g/dL)  RDW 14.4  11.5 - 15.5 (%)   Platelets 378  150 - 400 (K/uL)  BASIC METABOLIC PANEL     Status: Abnormal   Collection Time   06/05/11  6:20 AM      Component Value Range   Sodium 134 (*) 135 - 145 (mEq/L)   Potassium 4.7  3.5 - 5.1 (mEq/L)   Chloride 104  96 - 112 (mEq/L)   CO2 23  19 - 32 (mEq/L)   Glucose, Bld 116 (*) 70 - 99 (mg/dL)   BUN 25 (*) 6 - 23 (mg/dL)   Creatinine, Ser 1.58 (*) 0.50 - 1.10 (mg/dL)   Calcium 9.2  8.4 - 10.5 (mg/dL)   GFR calc non Af Amer 33 (*) >90 (mL/min)   GFR calc Af Amer 38 (*) >90 (mL/min)     Micro: Recent Results (from the past 240 hour(s))  URINE CULTURE     Status: Normal   Collection Time   05/31/11  3:22 PM      Component Value Range Status Comment   Specimen Description URINE, CLEAN CATCH   Final    Special Requests NONE   Final    Culture  Setup Time [id number]   Final    Colony Count NO GROWTH   Final    Culture NO GROWTH   Final    Report Status 06/01/2011 FINAL   Final     Studies/Results: No results found.  Medications:  Scheduled Meds:   . amLODipine  10 mg Oral Daily  . aspirin EC  325 mg Oral Daily  . enoxaparin  40 mg Subcutaneous QHS  . feeding supplement  237 mL Oral TID WC  . fentaNYL      . heparin      . labetalol      . lidocaine      . midazolam      . nicotine  14 mg  Transdermal Daily  . pantoprazole  40 mg Oral Q1200  . piperacillin-tazobactam (ZOSYN)  IV  3.375 g Intravenous Q8H  . vancomycin  750 mg Intravenous Q12H  . DISCONTD: aspirin EC  81 mg Oral Daily   Continuous Infusions:   .  sodium bicarbonate infusion 1068ml 75 mL/hr at 06/04/11 2235   PRN Meds:.acetaminophen, acetaminophen, hydrALAZINE, morphine, ondansetron (ZOFRAN) IV, ondansetron, oxyCODONE, oxyCODONE-acetaminophen, polyethylene glycol, DISCONTD: acetaminophen, DISCONTD: ondansetron (ZOFRAN) IV   Assessment:   1-Dry gangrene Left 1st Toe: With most likely superimposed cellulitis with Leukocytosis. She has severe vascular disease based on the arterial dopplers. Seen by Dr. Kellie Simmering with Vascular arteriogram shows evidence of significant infrainguinal arterial occlusive disease.  Continue broad spectrum antibiotics for now (Vanc and Zosyn initiated 2/28).  LOWER EXTREMITY ANGIOGRAMby Dr. Angelia Mould, MD showed Significant stenosis is seen in the proximal and mid segments of the femoral artery. The distal femoral arteryand the popliteal artery areoccluded, with recanalized flow seen in the mid to distal popliteal artery. The anterior tibial artery demonstrates dampened monophasic flow and the posterior tibial artery is occluded.  Further management by Dr. Kellie Simmering    2-Acute on Chronic kidney disease (CKD), (stage III) with Non AG Metabolic Acidosis: Secondary to prerenal azotemia most likely due to volume contraction. Baseline creatinine not clearly known. Last known labs in our system from 2010 (2.7 in May 2010). Continue IVF with Bicarb. Improvement in creat and bicarb noted. DC sodium bicarbonate infusion   4-Abnormal UA: Urine cultures are negative.    5-Abdominal pain, Nausea & vomiting Secondary to  Cholecystitis and Cholelithiasis: Lipase was only mildly elevated which could have been from the nausea and vomiting. Seen by Dr. Excell Seltzer who thinks she needs eventual  cholecystectomy along with hernia repair possibly during this hospitalization.   6-ANEMIA-NOS: Hgb improved post transfusion. Anemia panel suggests anemia due to chronic disease. No overt bleeding noted. FOBT pending. Continue to monitor CBC's.    7-Essential hypertension, benign: BP reasonable well controlled on Amlodipine 10mg  daily. PRN hydralazine for elevated BP.  8-Hyponatremia: Most likely secondary to dehydration and decreased by mouth intake. Change IVF.    9-Tobacco abuse: A smoking cessation counseling has been provided and nicotine patch has been ordered.  10- Abdominal Ventral hernia: Patient with chronic abdominal hernia that at this point is not incarcerated/strangulated; will continue monitoring. Will need repair per Surgeon.   11-DVT Prophylaxis: lovenox will be held for procedure tomorrow. Please re-address at that time.  12-Malnutrition: Dietitian following. Ensure.  Full Code     LOS: 5 days   [facility] 06/05/2011, 8:13 AM

## 2011-06-05 NOTE — Progress Notes (Signed)
ANTIBIOTIC CONSULT NOTE   Pharmacy Consult: Vancomycin Indication: Gangrene  No Known Allergies  Patient Measurements: Height: 5\' 8"  (172.7 cm) Weight: 201 lb 11.5 oz (91.5 kg) IBW/kg (Calculated) : 63.9   Vital Signs: Temp: 97 F (36.1 C) (03/05 2013) Temp src: Oral (03/05 2013) BP: 169/87 mmHg (03/05 2013) Pulse Rate: 90  (03/05 2013)  Labs:  Basename 06/05/11 0620 06/04/11 1218 06/04/11 0605  WBC 13.1* 15.0* 14.6*  HGB 9.7* 9.7* 9.2*  PLT 378 364 342  LABCREA -- -- --  CREATININE 1.58* 1.51* 1.51*   Estimated Creatinine Clearance: 41.4 ml/min (by C-G formula based on Cr of 1.58).   Basename 06/05/11 1704  VANCOTROUGH 30.2*  VANCOPEAK --  Jake Michaelis --  GENTTROUGH --  GENTPEAK --  GENTRANDOM --  TOBRATROUGH --  TOBRAPEAK --  TOBRARND --  AMIKACINPEAK --  AMIKACINTROU --  AMIKACIN --     Microbiology: Recent Results (from the past 720 hour(s))  URINE CULTURE     Status: Normal   Collection Time   05/31/11  3:22 PM      Component Value Range Status Comment   Specimen Description URINE, CLEAN CATCH   Final    Special Requests NONE   Final    Culture  Setup Time [id number]   Final    Colony Count NO GROWTH   Final    Culture NO GROWTH   Final    Report Status 06/01/2011 FINAL   Final   CULTURE, BLOOD (ROUTINE X 2)     Status: Normal (Preliminary result)   Collection Time   06/04/11 12:25 PM      Component Value Range Status Comment   Specimen Description BLOOD LEFT ARM   Final    Special Requests BOTTLES DRAWN AEROBIC AND ANAEROBIC 10CC   Final    Culture  Setup Time [id number]   Final    Culture     Final    Value:        BLOOD CULTURE RECEIVED NO GROWTH TO DATE CULTURE WILL BE HELD FOR 5 DAYS BEFORE ISSUING A FINAL NEGATIVE REPORT   Report Status PENDING   Incomplete   CULTURE, BLOOD (ROUTINE X 2)     Status: Normal (Preliminary result)   Collection Time   06/04/11 12:33 PM      Component Value Range Status Comment   Specimen Description BLOOD  LEFT HAND   Final    Special Requests BOTTLES DRAWN AEROBIC AND ANAEROBIC 10CC   Final    Culture  Setup Time [id number]   Final    Culture     Final    Value:        BLOOD CULTURE RECEIVED NO GROWTH TO DATE CULTURE WILL BE HELD FOR 5 DAYS BEFORE ISSUING A FINAL NEGATIVE REPORT   Report Status PENDING   Incomplete     Medical History: Past Medical History  Diagnosis Date  . Hypertension   . Hernia     Medications:   Anti-infectives     Start     Dose/Rate Route Frequency Ordered Stop   06/03/11 1800   vancomycin (VANCOCIN) 750 mg in sodium chloride 0.9 % 150 mL IVPB        750 mg 150 mL/hr over 60 Minutes Intravenous Every 12 hours 06/03/11 1608     06/03/11 1400   vancomycin (VANCOCIN) 750 mg in sodium chloride 0.9 % 150 mL IVPB  Status:  Discontinued        750 mg 150 mL/hr  over 60 Minutes Intravenous Every 12 hours 06/03/11 1336 06/03/11 1608   06/01/11 2200   vancomycin (VANCOCIN) 750 mg in sodium chloride 0.9 % 150 mL IVPB  Status:  Discontinued        750 mg 150 mL/hr over 60 Minutes Intravenous Every 24 hours 05/31/11 1744 06/03/11 1336   06/01/11 0200  piperacillin-tazobactam (ZOSYN) IVPB 3.375 g       3.375 g 12.5 mL/hr over 240 Minutes Intravenous 3 times per day 05/31/11 1744     05/31/11 1800   vancomycin (VANCOCIN) IVPB 1000 mg/200 mL premix        1,000 mg 200 mL/hr over 60 Minutes Intravenous  Once 05/31/11 1744 05/31/11 1928   05/31/11 1800  piperacillin-tazobactam (ZOSYN) IVPB 3.375 g       3.375 g 100 mL/hr over 30 Minutes Intravenous  Once 05/31/11 1744 05/31/11 1829          Assessment: 64 YOF with h/o CKD admitted with c/o abdominal pain, nausea/vomiting, dark discoloration of left big toe. Patient has a h/o left big toe infection (s/p toenail removal by podiatry and outpatient antibiotic use). Left first toe with dry gangrene and gas on x-ray, but no evidence of OM. Patient started on vanc and Zosyn empirically.  Vanc trougyh elevated at 30.2  mg/L.  Goal of Therapy:  Vancomycin trough level 15-20 mcg/ml   Plan:  - Change vanc 750mg  IV Q24 hours.  Excell Seltzer, Pharm D

## 2011-06-05 NOTE — Progress Notes (Signed)
VASCULAR AND VEIN SURGERY PROGRESS NOTE  Progress note  Date of Surgery: 05/31/2011 - 06/04/2011 Surgeon: Juliann Mule): Stephanie Mould, MD 1 Day Post-Op Left Procedure(s): LOWER EXTREMITY ANGIOGRAM   HPI: Stephanie Griffith is a 66 y.o. female who presented with dry gangrene of her left great toe. She was seen in consultation by Dr. Kellie Simmering and found to have evidence of significant infrainguinal arterial occlusive disease. She had significant renal insufficiency but her creatinine improved with hydration. I was asked to perform CO2 arteriography and then arteriogram with limited dye to evaluate for potential revascularization of the left lower extremity.  FINDINGS:  1. Infrarenal aortic, bilateral common iliac arteries, and bilateral external iliac arteries, and bilateral hypogastric arteries are patent.  2. On the left side, there is a moderate stenosis in the proximal superficial femoral artery. There is moderate disease at the adductor canal which then occludes with reconstitution of a small above-knee popliteal artery. This him mild to moderate disease of the pop 2 artery behind the knee. The below-knee pop artery is patent although small. This single arterial runoff on the left via the anterior tibial artery. The peroneal and posterior tibial arteries are occluded.  3. On the right side, the common femoral, superficial femoral, deep femoral,, popliteal, and proximal tibial vessels are patent. His poor visualization distally because CO2 was used and I did not want to use any more contrast.   Summary: Left Lower Extremity:  Significant stenosis is seen in the proximal and mid segments of the femoral artery. The distal femoral arteryand the popliteal artery areoccluded, with recanalized flow seen in the mid to distal popliteal artery. The anterior tibial artery demonstrates dampened monophasic flow and the posterior tibial artery is occluded. Other specific details can be found in the  table(s) above. Prepared and Electronically Authenticated by Dr. Kellie Simmering.      Significant Diagnostic Studies: CBC    Component Value Date/Time   WBC 13.1* 06/05/2011 0620   RBC 3.27* 06/05/2011 0620   HGB 9.7* 06/05/2011 0620   HCT 28.8* 06/05/2011 0620   PLT 378 06/05/2011 0620   MCV 88.1 06/05/2011 0620   MCH 29.7 06/05/2011 0620   MCHC 33.7 06/05/2011 0620   RDW 14.4 06/05/2011 0620   LYMPHSABS 1.3 05/31/2011 1100   MONOABS 1.3* 05/31/2011 1100   EOSABS 0.1 05/31/2011 1100   BASOSABS 0.1 05/31/2011 1100    BMET    Component Value Date/Time   NA 134* 06/05/2011 0620   K 4.7 06/05/2011 0620   CL 104 06/05/2011 0620   CO2 23 06/05/2011 0620   GLUCOSE 116* 06/05/2011 0620   BUN 25* 06/05/2011 0620   CREATININE 1.58* 06/05/2011 0620   CALCIUM 9.2 06/05/2011 0620   GFRNONAA 33* 06/05/2011 0620   GFRAA 38* 06/05/2011 0620    COAG Lab Results  Component Value Date   INR 1.13 06/04/2011   No results found for this basename: PTT     I/O last 3 completed shifts: In: 3432.1 [P.O.:480; I.V.:2752.1; IV Piggyback:200] Out: -   Physical Examination  Patient Vitals for the past 24 hrs:  BP Temp Temp src Pulse Resp SpO2  06/05/11 0532 144/82 mmHg 98.4 F (36.9 C) Oral 93  18  95 %  06/04/11 2012 132/83 mmHg 98.3 F (36.8 C) Oral 92  18  98 %  06/04/11 1500 151/69 mmHg - - 90  - -  06/04/11 1400 139/70 mmHg - - 90  - -  06/04/11 1347 135/65 mmHg 97.5 F (36.4 C)  Oral 91  16  98 %  06/04/11 1122 148/67 mmHg - - - - -   Left great toe gangrene, non palp pulse on exam today PT/DP.  Patient states she has gross sensation toes and feet equal bil. To light touch. Min. AROM left toes.      Assessment/Plan  Stephanie Griffith is a 67 y.o. year old female who is S/P Procedure(s):  LOWER EXTREMITY ANGIOGRAM Pending vein mapping and surgery plan with Dr. Oneida Alar. Laurence Slate [facility] 06/05/2011 7:53 AM

## 2011-06-05 NOTE — Progress Notes (Signed)
Nutrition Follow-up  Diet Order:  Carb Mod medium with Ensure Clinical Strength TID with meals Patient states her appetite has improved, is drinking the Ensure. RD observed empty bottle at bedside.  S/P angiogram on 3/4, anticipate amputation of toe, bypass procedure 3/6  Meds: Scheduled Meds:   . amLODipine  10 mg Oral Daily  . aspirin EC  325 mg Oral Daily  . enoxaparin  40 mg Subcutaneous QHS  . feeding supplement  237 mL Oral TID WC  . nicotine  14 mg Transdermal Daily  . pantoprazole  40 mg Oral Q1200  . piperacillin-tazobactam (ZOSYN)  IV  3.375 g Intravenous Q8H  . vancomycin  750 mg Intravenous Q12H  . DISCONTD: aspirin EC  81 mg Oral Daily   Continuous Infusions:   . sodium chloride 50 mL/hr at 06/05/11 0830  . DISCONTD:  sodium bicarbonate infusion 1051ml 75 mL/hr at 06/04/11 2235   PRN Meds:.acetaminophen, acetaminophen, hydrALAZINE, morphine, ondansetron (ZOFRAN) IV, ondansetron, oxyCODONE, oxyCODONE-acetaminophen, polyethylene glycol, DISCONTD: acetaminophen, DISCONTD: ondansetron (ZOFRAN) IV  Labs:  CMP     Component Value Date/Time   NA 134* 06/05/2011 0620   K 4.7 06/05/2011 0620   CL 104 06/05/2011 0620   CO2 23 06/05/2011 0620   GLUCOSE 116* 06/05/2011 0620   BUN 25* 06/05/2011 0620   CREATININE 1.58* 06/05/2011 0620   CALCIUM 9.2 06/05/2011 0620   PROT 6.7 06/04/2011 1218   ALBUMIN 2.6* 06/04/2011 1218   AST 36 06/04/2011 1218   ALT 11 06/04/2011 1218   ALKPHOS 96 06/04/2011 1218   BILITOT 0.3 06/04/2011 1218   GFRNONAA 33* 06/05/2011 0620   GFRAA 38* 06/05/2011 0620     Intake/Output Summary (Last 24 hours) at 06/05/11 1033 Last data filed at 06/05/11 0800  Gross per 24 hour  Intake 4707.08 ml  Output      0 ml  Net 4707.08 ml    Weight Status:  201 lbs, weight has increase 31 lbs since admission on 2/28, this is likely related to dehydration PTA and rehydration through this admission. This is 21 lbs above patients reported usual body weight.   Re-estimated needs:   1925-2160 kcal, 100-110 gm protein  Nutrition Dx:  Inadequate oral intake, improving  Goal:  PO intake of meals and supplements will continue to be >50%  Intervention:   Patient requests for Ensure to be vanilla flavor, RD will make note  Monitor:  PO intake, weight, labs, sx reports   Coralee Rud Pager #:  [phone]

## 2011-06-06 ENCOUNTER — Inpatient Hospital Stay (HOSPITAL_COMMUNITY): Payer: Medicare Other

## 2011-06-06 ENCOUNTER — Encounter: Payer: Self-pay | Admitting: Vascular Surgery

## 2011-06-06 ENCOUNTER — Inpatient Hospital Stay (HOSPITAL_COMMUNITY): Payer: Medicare Other | Admitting: Anesthesiology

## 2011-06-06 ENCOUNTER — Encounter (HOSPITAL_COMMUNITY): Payer: Self-pay | Admitting: Anesthesiology

## 2011-06-06 ENCOUNTER — Encounter (HOSPITAL_COMMUNITY)
Admission: EM | Disposition: A | Discharge status: Other Acute Inpatient Hospital | Payer: Self-pay | Source: Home / Self Care | Attending: Vascular Surgery

## 2011-06-06 DIAGNOSIS — I70269 Atherosclerosis of native arteries of extremities with gangrene, unspecified extremity: Secondary | ICD-10-CM

## 2011-06-06 DIAGNOSIS — J9819 Other pulmonary collapse: Secondary | ICD-10-CM

## 2011-06-06 DIAGNOSIS — J159 Unspecified bacterial pneumonia: Secondary | ICD-10-CM

## 2011-06-06 DIAGNOSIS — I739 Peripheral vascular disease, unspecified: Secondary | ICD-10-CM

## 2011-06-06 DIAGNOSIS — J9600 Acute respiratory failure, unspecified whether with hypoxia or hypercapnia: Secondary | ICD-10-CM

## 2011-06-06 HISTORY — PX: AMPUTATION: SHX166

## 2011-06-06 HISTORY — PX: FEMORAL-POPLITEAL BYPASS GRAFT: SHX937

## 2011-06-06 LAB — BASIC METABOLIC PANEL
BUN: 22 mg/dL (ref 6–23)
BUN: 21 mg/dL (ref 6–23)
CO2: 25 mEq/L (ref 19–32)
CO2: 19 mEq/L (ref 19–32)
Calcium: 9.2 mg/dL (ref 8.4–10.5)
Calcium: 7.7 mg/dL — ABNORMAL LOW (ref 8.4–10.5)
Chloride: 105 mEq/L (ref 96–112)
Chloride: 109 mEq/L (ref 96–112)
Creatinine, Ser: 1.57 mg/dL — ABNORMAL HIGH (ref 0.50–1.10)
Creatinine, Ser: 1.44 mg/dL — ABNORMAL HIGH (ref 0.50–1.10)
GFR calc Af Amer: 39 mL/min — ABNORMAL LOW (ref >90)
GFR calc Af Amer: 43 mL/min — ABNORMAL LOW (ref >90)
GFR calc non Af Amer: 33 mL/min — ABNORMAL LOW (ref >90)
GFR calc non Af Amer: 37 mL/min — ABNORMAL LOW (ref >90)
Glucose, Bld: 101 mg/dL — ABNORMAL HIGH (ref 70–99)
Glucose, Bld: 98 mg/dL (ref 70–99)
Potassium: 4.6 mEq/L (ref 3.5–5.1)
Potassium: 4.3 mEq/L (ref 3.5–5.1)
Sodium: 136 mEq/L (ref 135–145)
Sodium: 137 mEq/L (ref 135–145)

## 2011-06-06 LAB — POCT I-STAT 7, (LYTES, BLD GAS, ICA,H+H)
Acid-base deficit: 6 mmol/L — ABNORMAL HIGH (ref 0.0–2.0)
Acid-base deficit: 6 mmol/L — ABNORMAL HIGH (ref 0.0–2.0)
Bicarbonate: 19.2 mEq/L — ABNORMAL LOW (ref 20.0–24.0)
Bicarbonate: 18.9 mEq/L — ABNORMAL LOW (ref 20.0–24.0)
Calcium, Ion: 1.14 mmol/L (ref 1.12–1.32)
Calcium, Ion: 1.17 mmol/L (ref 1.12–1.32)
HCT: 27 % — ABNORMAL LOW (ref 36.0–46.0)
HCT: 27 % — ABNORMAL LOW (ref 36.0–46.0)
Hemoglobin: 9.2 g/dL — ABNORMAL LOW (ref 12.0–15.0)
Hemoglobin: 9.2 g/dL — ABNORMAL LOW (ref 12.0–15.0)
O2 Saturation: 83 %
O2 Saturation: 88 %
Patient temperature: 36.1
Patient temperature: 97.1
Potassium: 4.4 mEq/L (ref 3.5–5.1)
Potassium: 4.4 mEq/L (ref 3.5–5.1)
Sodium: 140 mEq/L (ref 135–145)
Sodium: 140 mEq/L (ref 135–145)
TCO2: 20 mmol/L (ref 0–100)
TCO2: 20 mmol/L (ref 0–100)
pCO2 arterial: 32.5 mmHg — ABNORMAL LOW (ref 35.0–45.0)
pCO2 arterial: 31.2 mmHg — ABNORMAL LOW (ref 35.0–45.0)
pH, Arterial: 7.376 (ref 7.350–7.400)
pH, Arterial: 7.386 (ref 7.350–7.400)
pO2, Arterial: 46 mmHg — ABNORMAL LOW (ref 80.0–100.0)
pO2, Arterial: 53 mmHg — ABNORMAL LOW (ref 80.0–100.0)

## 2011-06-06 LAB — POCT I-STAT 4, (NA,K, GLUC, HGB,HCT)
Glucose, Bld: 90 mg/dL (ref 70–99)
HCT: 25 % — ABNORMAL LOW (ref 36.0–46.0)
Hemoglobin: 8.5 g/dL — ABNORMAL LOW (ref 12.0–15.0)
Potassium: 4.5 mEq/L (ref 3.5–5.1)
Sodium: 139 mEq/L (ref 135–145)

## 2011-06-06 LAB — CBC
HCT: 26.9 % — ABNORMAL LOW (ref 36.0–46.0)
HCT: 25.9 % — ABNORMAL LOW (ref 36.0–46.0)
Hemoglobin: 8.9 g/dL — ABNORMAL LOW (ref 12.0–15.0)
Hemoglobin: 8.6 g/dL — ABNORMAL LOW (ref 12.0–15.0)
MCH: 29.9 pg (ref 26.0–34.0)
MCH: 29.7 pg (ref 26.0–34.0)
MCHC: 33.1 g/dL (ref 30.0–36.0)
MCHC: 33.2 g/dL (ref 30.0–36.0)
MCV: 90.3 fL (ref 78.0–100.0)
MCV: 89.3 fL (ref 78.0–100.0)
Platelets: 415 10*3/uL — ABNORMAL HIGH (ref 150–400)
Platelets: 359 10*3/uL (ref 150–400)
RBC: 2.98 MIL/uL — ABNORMAL LOW (ref 3.87–5.11)
RBC: 2.9 MIL/uL — ABNORMAL LOW (ref 3.87–5.11)
RDW: 14.5 % (ref 11.5–15.5)
RDW: 14.4 % (ref 11.5–15.5)
WBC: 12 10*3/uL — ABNORMAL HIGH (ref 4.0–10.5)
WBC: 25.4 10*3/uL — ABNORMAL HIGH (ref 4.0–10.5)

## 2011-06-06 LAB — ABO/RH: ABO/RH(D): B POS

## 2011-06-06 LAB — PROCALCITONIN: Procalcitonin: 9.08 ng/mL

## 2011-06-06 LAB — CARDIAC PANEL(CRET KIN+CKTOT+MB+TROPI)
CK, MB: 3.3 ng/mL (ref 0.3–4.0)
Relative Index: 1 (ref 0.0–2.5)
Total CK: 343 U/L — ABNORMAL HIGH (ref 7–177)
Troponin I: <0.3 ng/mL (ref <0.30)

## 2011-06-06 LAB — SURGICAL PCR SCREEN
MRSA, PCR: NEGATIVE
Staphylococcus aureus: NEGATIVE

## 2011-06-06 IMAGING — CR DG TIBIA/FIBULA 2V*L*
1 series · 1 of 1 positions shown · non-contrast
Comparison: None.

CLINICAL DATA: Incorrect needle count

LEFT TIBIA AND FIBULA - 2 VIEW

[view not recorded]
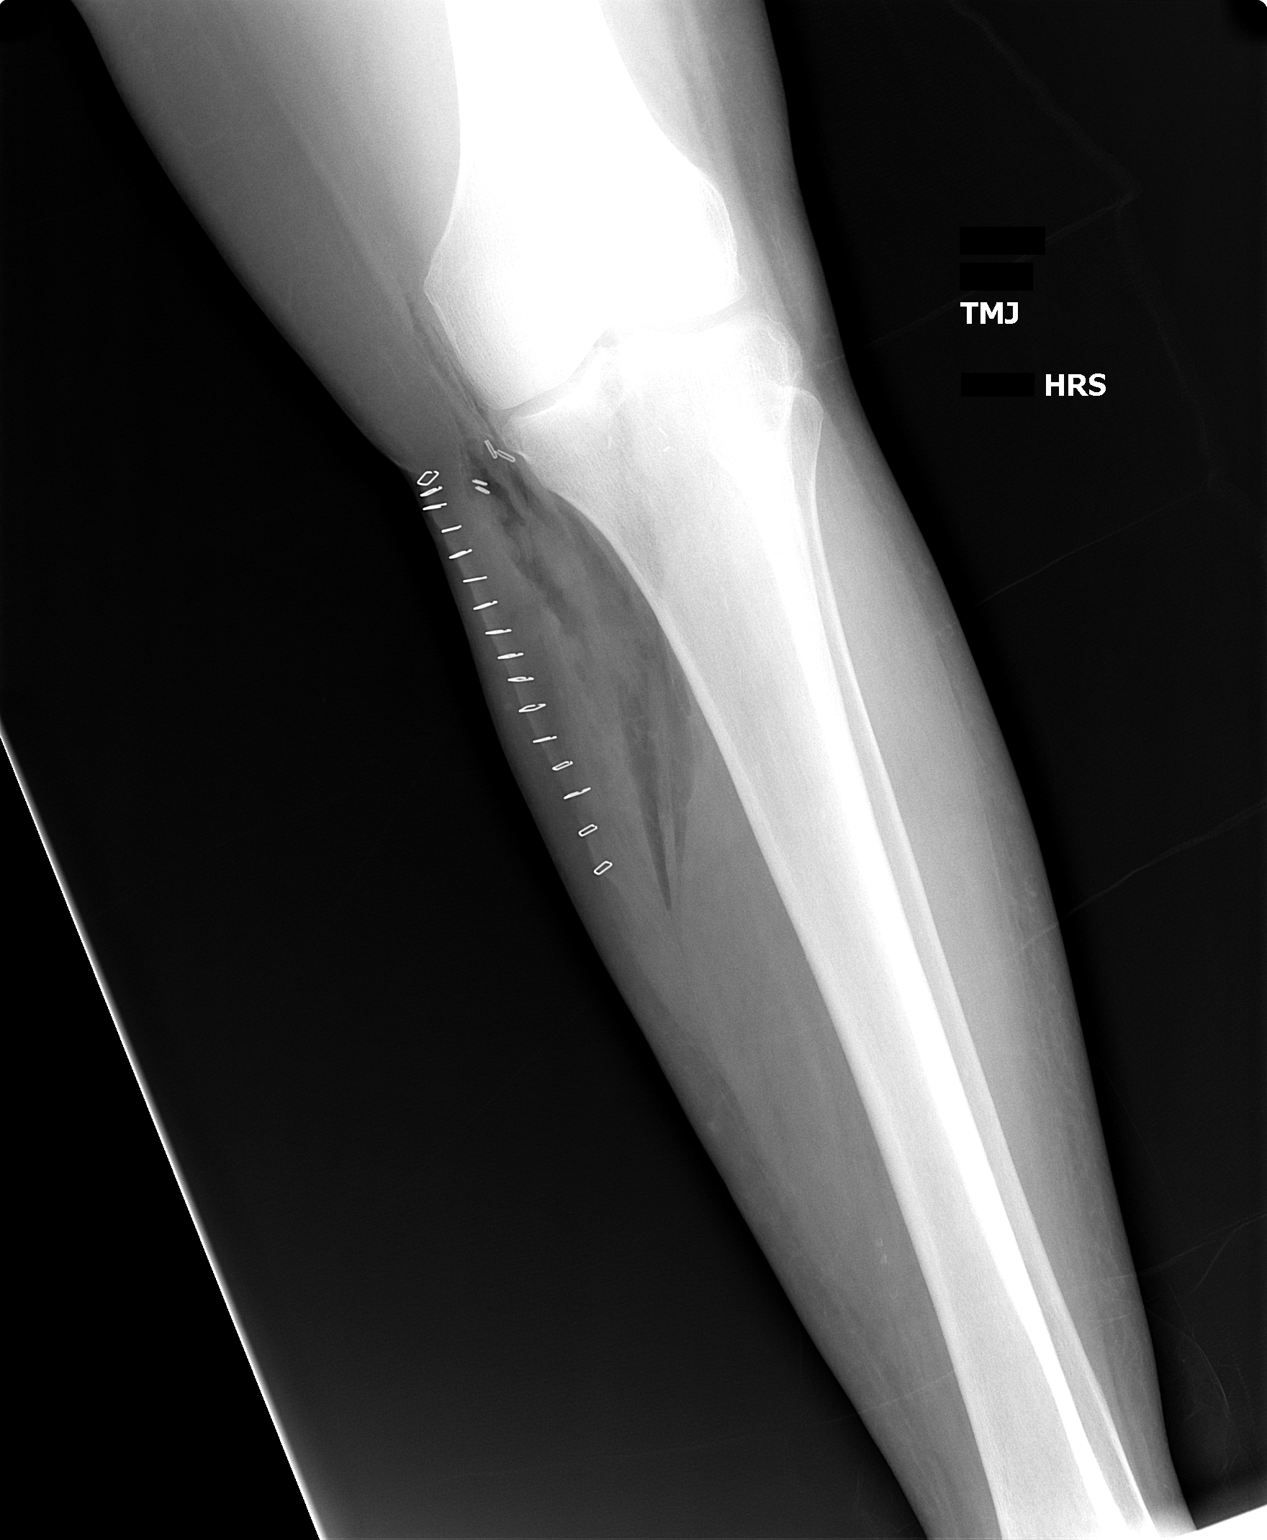

[1 of 1 positions shown; findings below may reference images not displayed]

FINDINGS: [VO] hours.  A single portable view of the left proximal
tibia / fibula is obtained.  Skin staples are seen medially.  There
are some surgical clips overlying the tibial metaphysis and just
medial to the medial tibial plateau.  Gas in the soft tissues is
compatible with the immediate postoperative state.  No unexpected
or suspicious radiopaque foreign body.  Specifically, no findings
to suggest the presence of a retained needle.
IMPRESSION: No unexpected or suspicious or radiopaque foreign body.

## 2011-06-06 IMAGING — CR DG CHEST 1V PORT
1 series · 1 of 1 positions shown · non-contrast
Comparison: Chest x-ray [DATE].

CLINICAL DATA: Postop fem-pop bypass procedure.  Patient in
respiratory distress.

PORTABLE CHEST - 1 VIEW

[view not recorded]
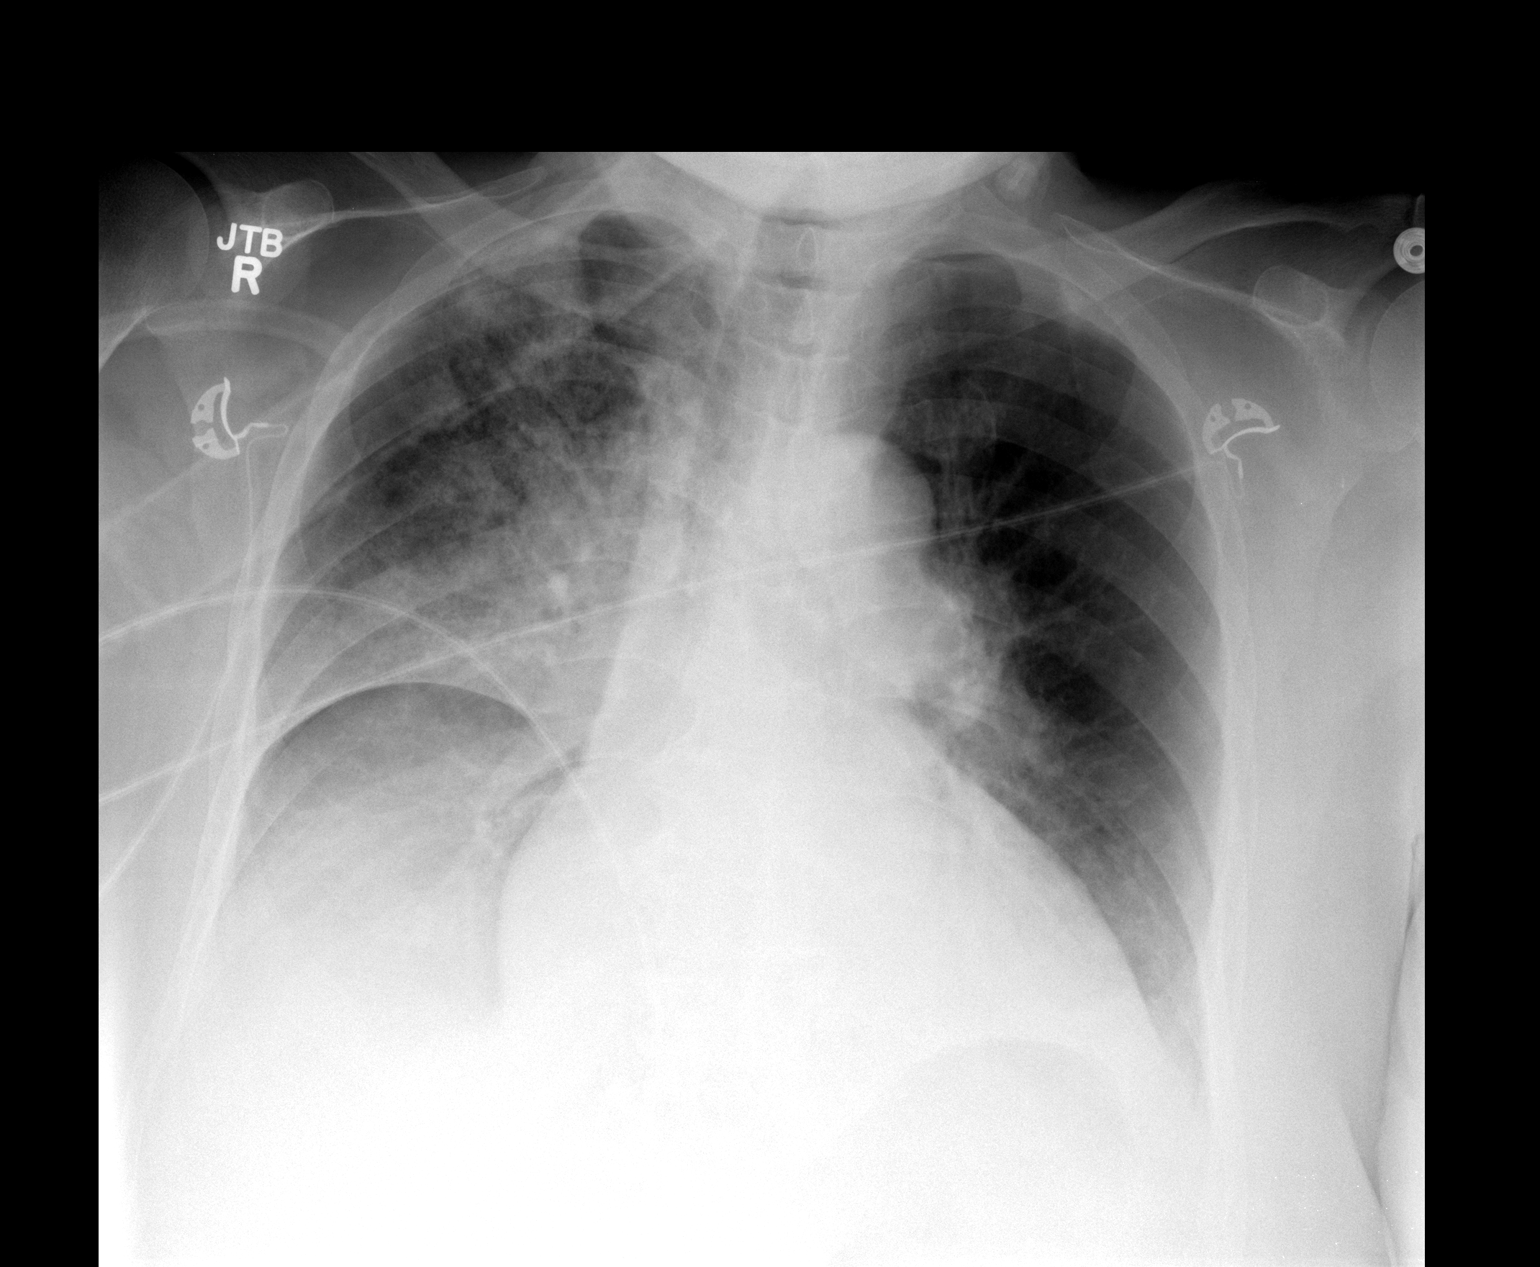

[1 of 1 positions shown; findings below may reference images not displayed]

FINDINGS: The heart is borderline enlarged.  There are small
bilateral pleural effusions.  Asymmetric airspace process in the
right lung suspicious for aspiration.  Asymmetric edema is possible
but unlikely.
IMPRESSION: Asymmetric airspace process in the right lung likely due to
aspiration and less likely asymmetric edema.

## 2011-06-06 IMAGING — CR DG FEMUR 2V*L*
1 series · 1 of 1 positions shown · non-contrast
Comparison: None.

CLINICAL DATA: Incorrect needle count.

LEFT FEMUR - 2 VIEW

[view not recorded]
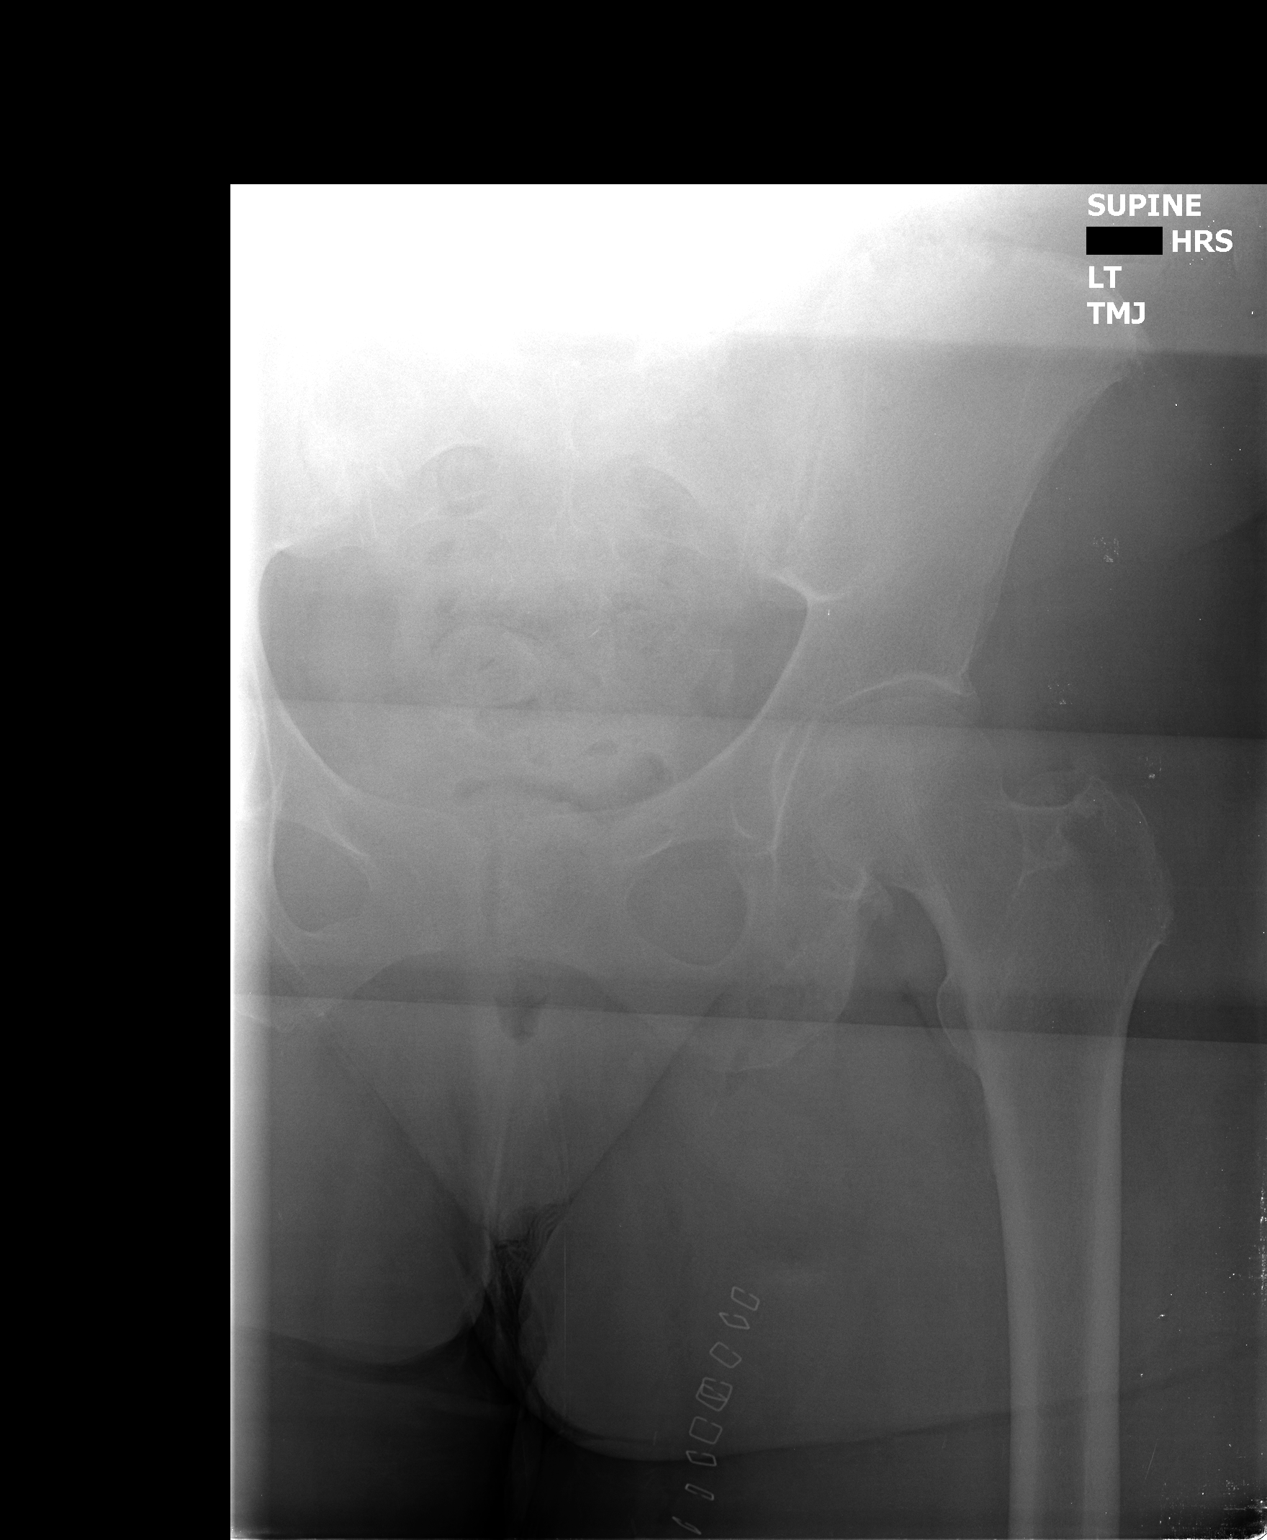

[1 of 1 positions shown; findings below may reference images not displayed]

FINDINGS: Portable supine view of the left hip obtained at [PD]
hours shows no unexpected or suspicious radiopaque foreign body.
There is some gas within the soft tissues of the left groin region.
Skin staples overlie the medial aspect of the proximal left thigh.
IMPRESSION: No evidence for a suspicious or unexpected retained radiopaque
foreign body.

## 2011-06-06 SURGERY — BYPASS GRAFT FEMORAL-POPLITEAL ARTERY
Anesthesia: General | Site: Toe | Laterality: Left | Wound class: Clean

## 2011-06-06 MED ORDER — METOPROLOL TARTRATE 1 MG/ML IV SOLN
2.0000 mg | INTRAVENOUS | Status: AC | PRN
  Administered 2011-06-08 (×2): 5 mg via INTRAVENOUS
  Filled 2011-06-06: qty 5

## 2011-06-06 MED ORDER — DOCUSATE SODIUM 100 MG PO CAPS
100.0000 mg | ORAL_CAPSULE | Freq: Every day | ORAL | Status: DC
  Administered 2011-06-07 – 2011-06-15 (×7): 100 mg via ORAL
  Filled 2011-06-06 (×9): qty 1

## 2011-06-06 MED ORDER — HEPARIN SODIUM (PORCINE) 1000 UNIT/ML IJ SOLN
INTRAMUSCULAR | Status: DC | PRN
  Administered 2011-06-06: 3000 [IU] via INTRAVENOUS
  Administered 2011-06-06: 10000 [IU] via INTRAVENOUS

## 2011-06-06 MED ORDER — PROTAMINE SULFATE 10 MG/ML IV SOLN
INTRAVENOUS | Status: DC | PRN
  Administered 2011-06-06 (×5): 10 mg via INTRAVENOUS

## 2011-06-06 MED ORDER — ALBUTEROL SULFATE (5 MG/ML) 0.5% IN NEBU
2.5000 mg | INHALATION_SOLUTION | Freq: Once | RESPIRATORY_TRACT | Status: AC
  Administered 2011-06-06: 2.5 mg via RESPIRATORY_TRACT

## 2011-06-06 MED ORDER — HYDRALAZINE HCL 20 MG/ML IJ SOLN
10.0000 mg | INTRAMUSCULAR | Status: DC | PRN
  Administered 2011-06-11: 10 mg via INTRAVENOUS
  Filled 2011-06-06: qty 0.5

## 2011-06-06 MED ORDER — ONDANSETRON HCL 4 MG/2ML IJ SOLN
INTRAMUSCULAR | Status: DC | PRN
  Administered 2011-06-06: 4 mg via INTRAVENOUS

## 2011-06-06 MED ORDER — HETASTARCH-ELECTROLYTES 6 % IV SOLN
INTRAVENOUS | Status: DC | PRN
  Administered 2011-06-06: 11:00:00 via INTRAVENOUS

## 2011-06-06 MED ORDER — PHENOL 1.4 % MT LIQD: 1.0000 | OROMUCOSAL | Status: DC | PRN

## 2011-06-06 MED ORDER — SODIUM CHLORIDE 0.9 % IV SOLN
INTRAVENOUS | Status: DC | PRN
  Administered 2011-06-06 (×2): via INTRAVENOUS

## 2011-06-06 MED ORDER — GUAIFENESIN-DM 100-10 MG/5ML PO SYRP: 15.0000 mL | ORAL_SOLUTION | ORAL | Status: DC | PRN

## 2011-06-06 MED ORDER — LABETALOL HCL 5 MG/ML IV SOLN
INTRAVENOUS | Status: DC | PRN
  Administered 2011-06-06 (×4): 5 mg via INTRAVENOUS

## 2011-06-06 MED ORDER — FAMOTIDINE IN NACL 20-0.9 MG/50ML-% IV SOLN
20.0000 mg | Freq: Two times a day (BID) | INTRAVENOUS | Status: DC
  Administered 2011-06-06 – 2011-06-07 (×3): 20 mg via INTRAVENOUS
  Filled 2011-06-06 (×5): qty 50

## 2011-06-06 MED ORDER — IPRATROPIUM BROMIDE 0.02 % IN SOLN: 0.5000 mg | RESPIRATORY_TRACT | Status: DC | PRN

## 2011-06-06 MED ORDER — ONDANSETRON HCL 4 MG/2ML IJ SOLN: 4.0000 mg | Freq: Once | INTRAMUSCULAR | Status: DC | PRN

## 2011-06-06 MED ORDER — 0.9 % SODIUM CHLORIDE (POUR BTL) OPTIME
TOPICAL | Status: DC | PRN
  Administered 2011-06-06: 2000 mL

## 2011-06-06 MED ORDER — MORPHINE SULFATE 2 MG/ML IJ SOLN
INTRAMUSCULAR | Status: AC
  Filled 2011-06-06: qty 1

## 2011-06-06 MED ORDER — ACETAMINOPHEN 325 MG PO TABS
325.0000 mg | ORAL_TABLET | ORAL | Status: DC | PRN
  Filled 2011-06-06: qty 2

## 2011-06-06 MED ORDER — MIDAZOLAM HCL 5 MG/5ML IJ SOLN
INTRAMUSCULAR | Status: DC | PRN
  Administered 2011-06-06: 1 mg via INTRAVENOUS

## 2011-06-06 MED ORDER — SODIUM CHLORIDE 0.9 % IR SOLN
Status: DC | PRN
  Administered 2011-06-06: 10:00:00

## 2011-06-06 MED ORDER — ONDANSETRON HCL 4 MG/2ML IJ SOLN: 4.0000 mg | Freq: Four times a day (QID) | INTRAMUSCULAR | Status: DC | PRN

## 2011-06-06 MED ORDER — OXYCODONE HCL 5 MG PO TABS
5.0000 mg | ORAL_TABLET | ORAL | Status: DC | PRN
  Administered 2011-06-07 (×2): 10 mg via ORAL
  Administered 2011-06-07: 5 mg via ORAL
  Administered 2011-06-08 – 2011-06-14 (×22): 10 mg via ORAL
  Administered 2011-06-14: 5 mg via ORAL
  Administered 2011-06-14: 10 mg via ORAL
  Administered 2011-06-14: 5 mg via ORAL
  Administered 2011-06-15: 10 mg via ORAL
  Filled 2011-06-06 (×3): qty 2
  Filled 2011-06-06 (×2): qty 1
  Filled 2011-06-06 (×2): qty 2
  Filled 2011-06-06 (×2): qty 1
  Filled 2011-06-06 (×19): qty 2
  Filled 2011-06-06: qty 1
  Filled 2011-06-06 (×2): qty 2

## 2011-06-06 MED ORDER — ALBUTEROL SULFATE (5 MG/ML) 0.5% IN NEBU
2.5000 mg | INHALATION_SOLUTION | RESPIRATORY_TRACT | Status: DC
  Administered 2011-06-07 (×2): 2.5 mg via RESPIRATORY_TRACT
  Filled 2011-06-06 (×3): qty 0.5

## 2011-06-06 MED ORDER — SODIUM CHLORIDE 0.9 % IV SOLN
INTRAVENOUS | Status: DC | PRN
  Administered 2011-06-06: 09:00:00 via INTRAVENOUS

## 2011-06-06 MED ORDER — NEOSTIGMINE METHYLSULFATE 1 MG/ML IJ SOLN
INTRAMUSCULAR | Status: DC | PRN
  Administered 2011-06-06: 5 mg via INTRAVENOUS

## 2011-06-06 MED ORDER — MORPHINE SULFATE 2 MG/ML IJ SOLN
2.0000 mg | INTRAMUSCULAR | Status: DC | PRN
  Administered 2011-06-06 (×4): 2 mg via INTRAVENOUS
  Administered 2011-06-07: 4 mg via INTRAVENOUS
  Administered 2011-06-07 – 2011-06-08 (×5): 2 mg via INTRAVENOUS
  Administered 2011-06-09: 4 mg via INTRAVENOUS
  Administered 2011-06-09 (×2): 2 mg via INTRAVENOUS
  Administered 2011-06-10 – 2011-06-12 (×7): 4 mg via INTRAVENOUS
  Filled 2011-06-06 (×4): qty 1
  Filled 2011-06-06: qty 2
  Filled 2011-06-06: qty 1
  Filled 2011-06-06: qty 2
  Filled 2011-06-06 (×2): qty 1
  Filled 2011-06-06 (×3): qty 2
  Filled 2011-06-06: qty 1
  Filled 2011-06-06: qty 2
  Filled 2011-06-06 (×2): qty 1
  Filled 2011-06-06 (×3): qty 2
  Filled 2011-06-06: qty 1
  Filled 2011-06-06: qty 2
  Filled 2011-06-06: qty 1

## 2011-06-06 MED ORDER — BISACODYL 5 MG PO TBEC
5.0000 mg | DELAYED_RELEASE_TABLET | Freq: Every day | ORAL | Status: DC | PRN
  Administered 2011-06-07: 5 mg via ORAL
  Filled 2011-06-06: qty 1

## 2011-06-06 MED ORDER — DEXTROSE 5 % IV SOLN: 1.5000 g | Freq: Two times a day (BID) | INTRAVENOUS | Status: DC

## 2011-06-06 MED ORDER — ACETAMINOPHEN 650 MG RE SUPP: 325.0000 mg | RECTAL | Status: DC | PRN

## 2011-06-06 MED ORDER — PROPOFOL 10 MG/ML IV EMUL
INTRAVENOUS | Status: DC | PRN
  Administered 2011-06-06: 120 mg via INTRAVENOUS

## 2011-06-06 MED ORDER — PIPERACILLIN-TAZOBACTAM 3.375 G IVPB
3.3750 g | Freq: Three times a day (TID) | INTRAVENOUS | Status: DC
  Administered 2011-06-06 – 2011-06-07 (×2): 3.375 g via INTRAVENOUS
  Filled 2011-06-06 (×3): qty 50

## 2011-06-06 MED ORDER — VANCOMYCIN HCL 1000 MG IV SOLR
750.0000 mg | INTRAVENOUS | Status: AC
  Administered 2011-06-06: 750 mg via INTRAVENOUS
  Filled 2011-06-06: qty 750

## 2011-06-06 MED ORDER — MAGNESIUM SULFATE 40 MG/ML IJ SOLN: 2.0000 g | Freq: Once | INTRAMUSCULAR | Status: AC | PRN

## 2011-06-06 MED ORDER — ALBUTEROL SULFATE (5 MG/ML) 0.5% IN NEBU: 2.5000 mg | INHALATION_SOLUTION | RESPIRATORY_TRACT | Status: DC | PRN

## 2011-06-06 MED ORDER — GLYCOPYRROLATE 0.2 MG/ML IJ SOLN
INTRAMUSCULAR | Status: DC | PRN
  Administered 2011-06-06: .8 mg via INTRAVENOUS

## 2011-06-06 MED ORDER — ESMOLOL HCL 10 MG/ML IV SOLN
INTRAVENOUS | Status: DC | PRN
  Administered 2011-06-06 (×2): 10 mg via INTRAVENOUS

## 2011-06-06 MED ORDER — SENNOSIDES-DOCUSATE SODIUM 8.6-50 MG PO TABS
1.0000 | ORAL_TABLET | Freq: Every evening | ORAL | Status: DC | PRN
  Filled 2011-06-06: qty 1

## 2011-06-06 MED ORDER — VECURONIUM BROMIDE 10 MG IV SOLR
INTRAVENOUS | Status: DC | PRN
  Administered 2011-06-06 (×2): 1 mg via INTRAVENOUS
  Administered 2011-06-06: 2 mg via INTRAVENOUS
  Administered 2011-06-06 (×3): 1 mg via INTRAVENOUS

## 2011-06-06 MED ORDER — IPRATROPIUM BROMIDE 0.02 % IN SOLN
0.5000 mg | RESPIRATORY_TRACT | Status: DC
  Administered 2011-06-07 (×2): 0.5 mg via RESPIRATORY_TRACT
  Filled 2011-06-06 (×3): qty 2.5

## 2011-06-06 MED ORDER — HYDROMORPHONE HCL PF 1 MG/ML IJ SOLN: 0.2500 mg | INTRAMUSCULAR | Status: DC | PRN

## 2011-06-06 MED ORDER — FUROSEMIDE 10 MG/ML IJ SOLN
10.0000 mg | Freq: Once | INTRAMUSCULAR | Status: AC
  Administered 2011-06-06: 10 mg via INTRAVENOUS

## 2011-06-06 MED ORDER — THROMBIN 20000 UNITS EX KIT
PACK | CUTANEOUS | Status: DC | PRN
  Administered 2011-06-06: 12:00:00 via TOPICAL

## 2011-06-06 MED ORDER — SODIUM CHLORIDE 0.9 % IV SOLN: INTRAVENOUS | Status: DC

## 2011-06-06 MED ORDER — ALBUMIN HUMAN 5 % IV SOLN
INTRAVENOUS | Status: DC | PRN
  Administered 2011-06-06: 13:00:00 via INTRAVENOUS

## 2011-06-06 MED ORDER — DOPAMINE-DEXTROSE 3.2-5 MG/ML-% IV SOLN
3.0000 ug/kg/min | INTRAVENOUS | Status: DC
Start: 2011-06-06 — End: 2011-06-06

## 2011-06-06 MED ORDER — LABETALOL HCL 5 MG/ML IV SOLN
10.0000 mg | INTRAVENOUS | Status: DC | PRN
  Administered 2011-06-06 – 2011-06-08 (×2): 10 mg via INTRAVENOUS
  Filled 2011-06-06 (×2): qty 4

## 2011-06-06 MED ORDER — SODIUM CHLORIDE 0.9 % IV SOLN: 500.0000 mL | Freq: Once | INTRAVENOUS | Status: AC | PRN

## 2011-06-06 MED ORDER — ROCURONIUM BROMIDE 100 MG/10ML IV SOLN
INTRAVENOUS | Status: DC | PRN
  Administered 2011-06-06: 50 mg via INTRAVENOUS

## 2011-06-06 MED ORDER — POTASSIUM CHLORIDE CRYS ER 20 MEQ PO TBCR: 20.0000 meq | EXTENDED_RELEASE_TABLET | Freq: Once | ORAL | Status: AC | PRN

## 2011-06-06 MED ORDER — FENTANYL CITRATE 0.05 MG/ML IJ SOLN
INTRAMUSCULAR | Status: DC | PRN
  Administered 2011-06-06: 50 ug via INTRAVENOUS
  Administered 2011-06-06 (×2): 100 ug via INTRAVENOUS
  Administered 2011-06-06 (×2): 50 ug via INTRAVENOUS
  Administered 2011-06-06: 100 ug via INTRAVENOUS
  Administered 2011-06-06: 50 ug via INTRAVENOUS
  Administered 2011-06-06: 100 ug via INTRAVENOUS
  Administered 2011-06-06: 50 ug via INTRAVENOUS

## 2011-06-06 SURGICAL SUPPLY — 61 items
ADH SKN CLS APL DERMABOND .7 (GAUZE/BANDAGES/DRESSINGS)
BANDAGE ESMARK 6X9 LF (GAUZE/BANDAGES/DRESSINGS) IMPLANT
BANDAGE GAUZE ELAST BULKY 4 IN (GAUZE/BANDAGES/DRESSINGS) ×1 IMPLANT
BNDG CMPR 9X6 STRL LF SNTH (GAUZE/BANDAGES/DRESSINGS)
BNDG ESMARK 6X9 LF (GAUZE/BANDAGES/DRESSINGS)
CANISTER SUCTION 2500CC (MISCELLANEOUS) ×3 IMPLANT
CLIP TI MEDIUM 24 (CLIP) ×3 IMPLANT
CLIP TI WIDE RED SMALL 24 (CLIP) ×3 IMPLANT
CLOTH BEACON ORANGE TIMEOUT ST (SAFETY) ×3 IMPLANT
CORONARY SUCKER SOFT TIP 10052 (MISCELLANEOUS) IMPLANT
COVER SURGICAL LIGHT HANDLE (MISCELLANEOUS) ×7 IMPLANT
CUFF TOURNIQUET SINGLE 24IN (TOURNIQUET CUFF) IMPLANT
CUFF TOURNIQUET SINGLE 34IN LL (TOURNIQUET CUFF) IMPLANT
CUFF TOURNIQUET SINGLE 44IN (TOURNIQUET CUFF) IMPLANT
DECANTER SPIKE VIAL GLASS SM (MISCELLANEOUS) IMPLANT
DERMABOND ADVANCED (GAUZE/BANDAGES/DRESSINGS)
DERMABOND ADVANCED .7 DNX12 (GAUZE/BANDAGES/DRESSINGS) IMPLANT
DRAIN SNY WOU (WOUND CARE) IMPLANT
DRAPE WARM FLUID 44X44 (DRAPE) ×3 IMPLANT
DRAPE X-RAY CASS 24X20 (DRAPES) ×1 IMPLANT
DRSG COVADERM 4X10 (GAUZE/BANDAGES/DRESSINGS) IMPLANT
DRSG COVADERM 4X6 (GAUZE/BANDAGES/DRESSINGS) ×1 IMPLANT
DRSG COVADERM 4X8 (GAUZE/BANDAGES/DRESSINGS) IMPLANT
ELECT REM PT RETURN 9FT ADLT (ELECTROSURGICAL) ×3
ELECTRODE REM PT RTRN 9FT ADLT (ELECTROSURGICAL) ×2 IMPLANT
EVACUATOR SILICONE 100CC (DRAIN) IMPLANT
GLOVE BIO SURGEON STRL SZ7.5 (GLOVE) ×3 IMPLANT
GOWN PREVENTION PLUS XLARGE (GOWN DISPOSABLE) ×3 IMPLANT
GOWN STRL NON-REIN LRG LVL3 (GOWN DISPOSABLE) ×6 IMPLANT
GRAFT PROPATEN W/RING 6X80X60 (Vascular Products) ×1 IMPLANT
KIT BASIN OR (CUSTOM PROCEDURE TRAY) ×3 IMPLANT
KIT ROOM TURNOVER OR (KITS) ×3 IMPLANT
MARKER GRAFT CORONARY BYPASS (MISCELLANEOUS) IMPLANT
NS IRRIG 1000ML POUR BTL (IV SOLUTION) ×6 IMPLANT
PACK PERIPHERAL VASCULAR (CUSTOM PROCEDURE TRAY) ×3 IMPLANT
PAD ARMBOARD 7.5X6 YLW CONV (MISCELLANEOUS) ×6 IMPLANT
PADDING CAST COTTON 6X4 STRL (CAST SUPPLIES) IMPLANT
SET COLLECT BLD 21X3/4 12 (NEEDLE) IMPLANT
SPONGE GAUZE 4X4 12PLY (GAUZE/BANDAGES/DRESSINGS) ×3 IMPLANT
SPONGE SURGIFOAM ABS GEL 100 (HEMOSTASIS) IMPLANT
STAPLER VISISTAT 35W (STAPLE) IMPLANT
STOPCOCK 4 WAY LG BORE MALE ST (IV SETS) IMPLANT
SUT ETHILON 3 0 PS 1 (SUTURE) ×1 IMPLANT
SUT PROLENE 5 0 C 1 24 (SUTURE) ×3 IMPLANT
SUT PROLENE 6 0 CC (SUTURE) ×7 IMPLANT
SUT PROLENE 7 0 BV 1 (SUTURE) ×3 IMPLANT
SUT PROLENE 7 0 BV1 MDA (SUTURE) IMPLANT
SUT SILK 2 0 SH (SUTURE) ×3 IMPLANT
SUT SILK 3 0 (SUTURE) ×6
SUT SILK 3-0 18XBRD TIE 12 (SUTURE) IMPLANT
SUT VIC AB 2-0 CTX 36 (SUTURE) ×6 IMPLANT
SUT VIC AB 3-0 SH 27 (SUTURE) ×6
SUT VIC AB 3-0 SH 27X BRD (SUTURE) ×4 IMPLANT
SUT VIC AB 4-0 PS2 27 (SUTURE) ×1 IMPLANT
TAPE UMBILICAL COTTON 1/8X30 (MISCELLANEOUS) IMPLANT
TOWEL OR 17X24 6PK STRL BLUE (TOWEL DISPOSABLE) ×7 IMPLANT
TOWEL OR 17X26 10 PK STRL BLUE (TOWEL DISPOSABLE) ×6 IMPLANT
TRAY FOLEY CATH 14FRSI W/METER (CATHETERS) ×3 IMPLANT
TUBING EXTENTION W/L.L. (IV SETS) IMPLANT
UNDERPAD 30X30 INCONTINENT (UNDERPADS AND DIAPERS) ×3 IMPLANT
WATER STERILE IRR 1000ML POUR (IV SOLUTION) ×3 IMPLANT

## 2011-06-06 NOTE — Preoperative (Signed)
Beta Blockers   Reason not to administer Beta Blockers:Not Applicable 

## 2011-06-06 NOTE — Progress Notes (Signed)
Anesthesiology note:  [name] is a 66 year old female with severe peripheral vascular disease and gangrene of the left first toe. She underwent right femoral to below knee popliteal bypass and left first toe amputation by Dr. Oneida Alar. She has a history of hypertension, cigarette smoking, and moderate renal insufficiency with a creatinine of 1.57.  The patient underwent general endotracheal anesthesia and her intraoperative course was uneventful. On emergence from anesthesia she was extubated and brought to the recovery room. In the recovery room she was noted to be tachpneic and with labored shallow respirations. She was placed on BiPAP and a chest x-ray revealed right lung asymmetric edema and volume loss. The patient was never noted to have vomiting before orr after intubation and no gastric secretions were noted in the mouth during the procedure.  Initial arterial blood gas: PH: 7.38 PCO2: 32.5 PCO2: 46 on 50% BiPAP.  Following incentive spirometry and pulmonary toe it her subsequent blood gases improved as well as her breathing pattern and her mental status.  Vital signs temp 36 9 BP 117/65 heart rate 101 respiratory rate 21.  Lungs scattered rhonchi bilaterally with diminished breath sounds on the right.  Impression: Postoperative right lung infiltrate versus pulmonary edema possibly due to aspiration pneumonitis . Patient is now clinically improved and has an oxygen saturation of 98% on 50% Venturi mak  Plan : Transfer to SICU continue pulmonary toilet and consult critical care medicine. Rule out MI with serial enzymes and EKG.  Roberts Gaudy M.D.

## 2011-06-06 NOTE — Progress Notes (Signed)
Dr. Oneida Alar at bedside.  Order received for 2300 bed assignment and cardiac enzymes x3 q 8hr.  Cardiac Enzymes collected and sent to lab.

## 2011-06-06 NOTE — Transfer of Care (Signed)
Immediate Anesthesia Transfer of Care Note  Patient: Stephanie Griffith  Procedure(s) Performed: Procedure(s) (LRB): BYPASS GRAFT FEMORAL-POPLITEAL ARTERY (Left) AMPUTATION DIGIT (Left)  Patient Location: PACU  Anesthesia Type: General  Level of Consciousness: awake and alert   Airway & Oxygen Therapy: Patient Spontanous Breathing and Patient connected to face mask oxygen  Post-op Assessment: Report given to PACU RN, Post -op Vital signs reviewed and stable and Patient moving all extremities X 4  Post vital signs: Reviewed and stable  Complications: No apparent anesthesia complications

## 2011-06-06 NOTE — Interval H&P Note (Signed)
History and Physical Interval Note:  06/06/2011 9:06 AM  Stephanie Griffith  has presented today for surgery, with the diagnosis of PVD  The various methods of treatment have been discussed with the patient and family. After consideration of risks, benefits and other options for treatment, the patient has consented to  Procedure(s) (LRB): BYPASS GRAFT FEMORAL-POPLITEAL ARTERY (Left) as a surgical intervention .  The patients' history has been reviewed, patient examined, no change in status, stable for surgery.  I have reviewed the patients' chart and labs.  Questions were answered to the patient's satisfaction.     Stephanie Griffith

## 2011-06-06 NOTE — Anesthesia Postprocedure Evaluation (Signed)
  Anesthesia Post-op Note  Patient: Stephanie Griffith  Procedure(s) Performed: Procedure(s) (LRB): BYPASS GRAFT FEMORAL-POPLITEAL ARTERY (Left) AMPUTATION DIGIT (Left)  Patient Location: PACU  Anesthesia Type: General  Level of Consciousness: awake, alert , oriented and patient cooperative  Airway and Oxygen Therapy: Patient Spontanous Breathing and Patient connected to nasal cannula oxygen  Post-op Pain: mild  Post-op Assessment: Post-op Vital signs reviewed, Patient's Cardiovascular Status Stable, Respiratory Function Stable, Patent Airway, No signs of Nausea or vomiting and Pain level controlled  Post-op Vital Signs: stable  Complications: No apparent anesthesia complications

## 2011-06-06 NOTE — Progress Notes (Signed)
Patient ID: Stephanie Griffith, female   DOB: Jun 21, 1945, 66 y.o.   MRN: [id number] Subjective: No events overnight. Patient denies chest pain, shortness of breath, abdominal pain. Patient seen post operatively.  Objective:  Vital signs in last 24 hours:  Filed Vitals:   06/06/11 1845 06/06/11 2000 06/06/11 2100 06/06/11 2200  BP:  155/56 149/60 145/63  Pulse: 109 111  108  Temp: 97.2 F (36.2 C) 97.3 F (36.3 C)    TempSrc:  Oral    Resp: 17 22 22 18   Height:      Weight:      SpO2: 100% 100% 98% 97%    Intake/Output from previous day:   Intake/Output Summary (Last 24 hours) at 06/06/11 2215 Last data filed at 06/06/11 2200  Gross per 24 hour  Intake   3655 ml  Output   1750 ml  Net   1905 ml    Physical Exam: General: Alert, awake, oriented x3, in no acute distress. HEENT: No bruits, no goiter. Moist mucous membranes, no scleral icterus, no conjunctival pallor. Heart: Regular rate and rhythm, S1/S2 +, no murmurs, rubs, gallops. Lungs: Clear to auscultation bilaterally. No wheezing, no rhonchi, no rales.  Abdomen: Soft, nontender, nondistended, positive bowel sounds. Extremities: No clubbing or cyanosis, no pitting edema,  positive pedal pulses. Neuro: Grossly nonfocal.  Lab Results:  Basic Metabolic Panel:    Component Value Date/Time   NA 140 06/06/2011 1650   K 4.4 06/06/2011 1650   CL 109 06/06/2011 1547   CO2 19 06/06/2011 1547   BUN 21 06/06/2011 1547   CREATININE 1.44* 06/06/2011 1547   GLUCOSE 98 06/06/2011 1547   CALCIUM 7.7* 06/06/2011 1547   CBC:    Component Value Date/Time   WBC 25.4* 06/06/2011 1547   HGB 9.2* 06/06/2011 1650   HCT 27.0* 06/06/2011 1650   PLT 359 06/06/2011 1547   MCV 89.3 06/06/2011 1547   NEUTROABS 13.3* 05/31/2011 1100   LYMPHSABS 1.3 05/31/2011 1100   MONOABS 1.3* 05/31/2011 1100   EOSABS 0.1 05/31/2011 1100   BASOSABS 0.1 05/31/2011 1100      Lab 06/06/11 1650 06/06/11 1553 06/06/11 1547 06/06/11 1010 06/06/11 0518 06/05/11 0620 06/04/11 1218  06/04/11 0605 05/31/11 1100  WBC -- -- 25.4* -- 12.0* 13.1* 15.0* 14.6* --  HGB 9.2* 9.2* 8.6* 8.5* 8.9* -- -- -- --  HCT 27.0* 27.0* 25.9* 25.0* 26.9* -- -- -- --  PLT -- -- 359 -- 415* 378 364 342 --  MCV -- -- 89.3 -- 90.3 88.1 87.1 87.2 --  MCH -- -- 29.7 -- 29.9 29.7 29.8 29.4 --  MCHC -- -- 33.2 -- 33.1 33.7 34.2 33.7 --  RDW -- -- 14.4 -- 14.5 14.4 14.2 14.4 --  LYMPHSABS -- -- -- -- -- -- -- -- 1.3  MONOABS -- -- -- -- -- -- -- -- 1.3*  EOSABS -- -- -- -- -- -- -- -- 0.1  BASOSABS -- -- -- -- -- -- -- -- 0.1  BANDABS -- -- -- -- -- -- -- -- --    Lab 06/06/11 1650 06/06/11 1553 06/06/11 1547 06/06/11 1010 06/06/11 0518 06/05/11 0620 06/04/11 1218 06/04/11 0605  NA 140 140 137 139 136 -- -- --  K 4.4 4.4 4.3 4.5 4.6 -- -- --  CL -- -- 109 -- 105 104 102 104  CO2 -- -- 19 -- 25 23 21 22   GLUCOSE -- -- 98 90 101* 116* 108* --  BUN -- -- 21 --  22 25* 23 23  CREATININE -- -- 1.44* -- 1.57* 1.58* 1.51* 1.51*  CALCIUM -- -- 7.7* -- 9.2 9.2 9.1 9.2  MG -- -- -- -- -- -- -- --    Lab 06/04/11 0605  INR 1.13  PROTIME --   Cardiac markers:  Lab 06/06/11 1502  CKMB 3.3  TROPONINI <0.30  MYOGLOBIN --   No components found with this basename: POCBNP:3 Recent Results (from the past 240 hour(s))  URINE CULTURE     Status: Normal   Collection Time   05/31/11  3:22 PM      Component Value Range Status Comment   Specimen Description URINE, CLEAN CATCH   Final    Special Requests NONE   Final    Culture  Setup Time [id number]   Final    Colony Count NO GROWTH   Final    Culture NO GROWTH   Final    Report Status 06/01/2011 FINAL   Final   CULTURE, BLOOD (ROUTINE X 2)     Status: Normal (Preliminary result)   Collection Time   06/04/11 12:25 PM      Component Value Range Status Comment   Specimen Description BLOOD LEFT ARM   Final    Special Requests BOTTLES DRAWN AEROBIC AND ANAEROBIC 10CC   Final    Culture  Setup Time [id number]   Final    Culture     Final    Value:         BLOOD CULTURE RECEIVED NO GROWTH TO DATE CULTURE WILL BE HELD FOR 5 DAYS BEFORE ISSUING A FINAL NEGATIVE REPORT   Report Status PENDING   Incomplete   CULTURE, BLOOD (ROUTINE X 2)     Status: Normal (Preliminary result)   Collection Time   06/04/11 12:33 PM      Component Value Range Status Comment   Specimen Description BLOOD LEFT HAND   Final    Special Requests BOTTLES DRAWN AEROBIC AND ANAEROBIC 10CC   Final    Culture  Setup Time [id number]   Final    Culture     Final    Value:        BLOOD CULTURE RECEIVED NO GROWTH TO DATE CULTURE WILL BE HELD FOR 5 DAYS BEFORE ISSUING A FINAL NEGATIVE REPORT   Report Status PENDING   Incomplete   SURGICAL PCR SCREEN     Status: Normal   Collection Time   06/06/11  3:30 AM      Component Value Range Status Comment   MRSA, PCR NEGATIVE  NEGATIVE  Final    Staphylococcus aureus NEGATIVE  NEGATIVE  Final     Studies/Results: Dg Femur Left  06/06/2011  *RADIOLOGY REPORT*  Clinical Data: Incorrect needle count.  LEFT FEMUR - 2 VIEW  Comparison: None.  Findings: Portable supine view of the left hip obtained at 1255 hours shows no unexpected or suspicious radiopaque foreign body. There is some gas within the soft tissues of the left groin region. Skin staples overlie the medial aspect of the proximal left thigh.  IMPRESSION: No evidence for a suspicious or unexpected retained radiopaque foreign body.  Original Report Authenticated By: ERIC A. MANSELL, M.D.   Dg Tibia/fibula Left  06/06/2011  *RADIOLOGY REPORT*  Clinical Data: Incorrect needle count  LEFT TIBIA AND FIBULA - 2 VIEW  Comparison: None.  Findings: 1255 hours.  A single portable view of the left proximal tibia / fibula is obtained.  Skin staples are seen medially.  There  are some surgical clips overlying the tibial metaphysis and just medial to the medial tibial plateau.  Gas in the soft tissues is compatible with the immediate postoperative state.  No unexpected or suspicious radiopaque  foreign body.  Specifically, no findings to suggest the presence of a retained needle.  IMPRESSION: No unexpected or suspicious or radiopaque foreign body.  Original Report Authenticated By: ERIC A. MANSELL, M.D.   Dg Chest Port 1 View  06/06/2011  *RADIOLOGY REPORT*  Clinical Data: Postop fem-pop bypass procedure.  Patient in respiratory distress.  PORTABLE CHEST - 1 VIEW  Comparison: Chest x-ray 06/02/2011.  Findings: The heart is borderline enlarged.  There are small bilateral pleural effusions.  Asymmetric airspace process in the right lung suspicious for aspiration.  Asymmetric edema is possible but unlikely.  IMPRESSION: Asymmetric airspace process in the right lung likely due to aspiration and less likely asymmetric edema.  Original Report Authenticated By: P. Kalman Jewels, M.D.    Medications: Scheduled Meds:   . albuterol  2.5 mg Nebulization Once  . ipratropium  0.5 mg Nebulization Q4H   And  . albuterol  2.5 mg Nebulization Q4H  . amLODipine  10 mg Oral Daily  . docusate sodium  100 mg Oral Daily  . famotidine (PEPCID) IV  20 mg Intravenous Q12H  . feeding supplement  237 mL Oral TID WC  . furosemide  10 mg Intravenous Once  . morphine      . piperacillin-tazobactam (ZOSYN)  IV  3.375 g Intravenous Q8H  . vancomycin  750 mg Intravenous Q24H  . DISCONTD: aspirin EC  325 mg Oral Daily  . DISCONTD: cefUROXime (ZINACEF)  IV  1.5 g Intravenous Q12H  . DISCONTD: enoxaparin  40 mg Subcutaneous QHS  . DISCONTD: nicotine  14 mg Transdermal Daily  . DISCONTD: pantoprazole  40 mg Oral Q1200  . DISCONTD: piperacillin-tazobactam (ZOSYN)  IV  3.375 g Intravenous Q8H  . DISCONTD: vancomycin  750 mg Intravenous Q24H   Continuous Infusions:   . sodium chloride 100 mL/hr at 06/06/11 1915  . DISCONTD: sodium chloride 50 mL/hr at 06/05/11 0830  . DISCONTD: DOPamine     PRN Meds:.sodium chloride, acetaminophen, acetaminophen, albuterol, bisacodyl, guaiFENesin-dextromethorphan, hydrALAZINE,  ipratropium, labetalol, magnesium sulfate 1 - 4 g bolus IVPB, metoprolol, morphine injection, ondansetron, ondansetron, oxyCODONE, phenol, potassium chloride, senna-docusate, DISCONTD: 0.9 % irrigation (POUR BTL), DISCONTD: acetaminophen, DISCONTD: acetaminophen, DISCONTD: fentaNYL, DISCONTD: heparin 6000 unit irrigation DISCONTD: hydrALAZINE, DISCONTD: HYDROmorphone, DISCONTD: midazolam, DISCONTD: morphine, DISCONTD: ondansetron (ZOFRAN) IV, DISCONTD: ondansetron (ZOFRAN) IV, DISCONTD: oxyCODONE, DISCONTD: oxyCODONE-acetaminophen, DISCONTD: polyethylene glycol, DISCONTD: Surgifoam 100 with Thrombin 20,000 units (20 ml) topical solution  Assessment/Plan:  1.Dry gangrene Left 1st Toe: With most likely superimposed cellulitis with Leukocytosis.  She has severe vascular disease based on the arterial dopplers. Seen by Dr. Kellie Simmering with Vascular arteriogram shows evidence of significant infrainguinal arterial occlusive disease; status post FEM-POP bypass (POD 0) Continue broad spectrum antibiotics for now (Vanc and Zosyn initiated 2/28).    2-Acute on Chronic kidney disease (CKD), (stage III) with Non AG Metabolic Acidosis: Secondary to prerenal azotemia most likely due to volume contraction. Baseline creatinine not clearly known. Last known labs in our system from 2010 (2.7 in May 2010).  4-Abnormal UA: Urine cultures are negative.  5-Abdominal pain, Nausea & vomiting Secondary to Cholecystitis and Cholelithiasis: Lipase was only mildly elevated which could have been from the nausea and vomiting. Seen by Dr. Excell Seltzer who thinks she needs eventual cholecystectomy along with hernia repair possibly during  this hospitalization.  6-ANEMIA-NOS: Hgb improved post transfusion. Anemia panel suggests anemia due to chronic disease. No overt bleeding noted. FOBT pending. Continue to monitor CBC's.  7-Essential hypertension, benign: BP reasonable well controlled on Amlodipine 10mg  daily. PRN hydralazine for elevated BP.    8-Hyponatremia: Most likely secondary to dehydration and decreased by mouth intake. Change IVF.  9-Tobacco abuse: A smoking cessation counseling has been provided and nicotine patch has been ordered.  10- Abdominal Ventral hernia: Patient with chronic abdominal hernia that at this point is not incarcerated/strangulated; will continue monitoring. Will need repair per Surgeon.  11-DVT Prophylaxis: lovenox will be held for; per surgery. Please re-address at that time.  12-Malnutrition: Dietitian following. Ensure.   EDUCATION - test results and diagnostic studies were discussed with patient and pt's family who was present at the bedside - patient and family have verbalized the understanding - questions were answered at the bedside and contact information was provided for additional questions or concerns   LOS: 6 days   Mamie Hundertmark 06/06/2011, 10:15 PM  [facility] Pager: [phone]

## 2011-06-06 NOTE — Consult Note (Signed)
Name: Jerldine Rolison MRN: [id number] DOB: 10/13/1945    LOS: 6  PCCM CONSULTATION NOTE  History of Present Illness: 66 y/o with smoker PVD and left first toe gangrene, s/p temporoparietal bypass and toe amputation.  Extubated in OR but noted to have labored shallow breathing in PACU. CXR revealed R>L airspace disease.  Required BiPAP transiently, but now on VTM 35% saturating 99-100.  Feels weak but denies dyspnea or chest pain.  Lines / Drains: 3/6  Foley 3/6  A-line  Cultures: 3/4  Blood>>>NTD  Antibiotics: 3/6  Cefuroxime x 2 3/6  Vancomycin x 1 3/6  Zosyn  Tests / Events: 3/6  Femoropopliteal bypass, first left toe amputation, transient postop respiratory failure    Past Medical History  Diagnosis Date  . Hypertension   . Hernia    Past Surgical History  Procedure Date  . Tubal ligation    Prior to Admission medications   Medication Sig Start Date End Date Taking? Authorizing Provider  aspirin 81 MG EC tablet Take 81 mg by mouth daily.      Historical Provider, MD  hydrochlorothiazide 25 MG tablet Take 25 mg by mouth daily.      Historical Provider, MD  metoprolol (LOPRESSOR) 25 MG tablet Take 25 mg by mouth 2 (two) times daily.      Historical Provider, MD   Allergies No Known Allergies  Family History History reviewed. No pertinent family history.  Social History  reports that she has been smoking.  She has never used smokeless tobacco. She reports that she does not drink alcohol or use illicit drugs.  Review Of Systems  As per HPI  Vital Signs: Temp:  [97 F (36.1 C)-97.9 F (36.6 C)] 97.2 F (36.2 C) (03/06 1845) Pulse Rate:  [90-205] 109  (03/06 1845) Resp:  [17-37] 17  (03/06 1845) BP: (117-199)/(61-103) 152/67 mmHg (03/06 1835) SpO2:  [87 %-100 %] 100 % (03/06 1845) Arterial Line BP: (143-275)/(57-90) 178/60 mmHg (03/06 1845) FiO2 (%):  [0 %-100 %] 50 % (03/06 1715) I/O last 3 completed shifts: In: [id number] [P.O.:720; I.V.:3550; IV Piggyback:850] Out:  [id number] [Urine:1200; Blood:125]  Physical Examination: General:  Sleepy, not in distress Neuro:  Awake, somnolent but arouses to voice HEENT:  PERRL, pink conjunctivae, moist membranes Neck:  Supple, no JVD   Cardiovascular:  RRR, no M/R/G Lungs:  Bilateral diminished air entry, no W/R/R Abdomen:  Soft, nontender, nondistended, bowel sounds present Musculoskeletal:  Moves all extremities, surgical dressing C/D/I Skin:  No rash  Ventilator settings: Vent Mode:  [-]  FiO2 (%):  [0 %-100 %] 50 %  Labs and Imaging:  Reviewed.  Please refer to the Assessment and Plan section for relevant results.  ASSESSMENT AND PLAN  NEUROLOGIC A:  No active issues.  Good pain control. P: -->  Morphine / Oxycodone for pain  PULMONARY  Lab 06/06/11 1650 06/06/11 1553  PHART 7.386 7.376  PCO2ART 31.2* 32.5*  PO2ART 53.0* 46.0*  HCO3 18.9* 19.2*  O2SAT 88.0 83.0   A:  Transient (acute) postoperative hypoxemic respiratory failure.  Atelectasis vs aspiration pneumonitis.  Less likely pneumonia.  Doubt pulmonary edema.  History of smoking.  Possibly COPD but no evidence of chronic respiratory failure. CXR 3/6 >>> Airspace disease R>L. P: -->  Bronchodilators -->  Incentive spirometry -->  ABX per ID section -->  CXR in AM -->  Supplemental oxygen, maintain SpO2> 92%  CARDIOVASCULAR  Lab 06/06/11 1502 05/31/11 1049  TROPONINI <0.30 --  LATICACIDVEN -- 0.9  PROBNP -- --  A:  No arrhythmia / ischemia.  Hemodynamically stable.  History of Hypertension.  Status post femoropopliteal bypass. P: -->  Per Vascular -->  Antihypertensives -->  Follow cardiac enzymes  RENAL  Lab 06/06/11 1650 06/06/11 1553 06/06/11 1547 06/06/11 1010 06/06/11 0518 06/05/11 0620 06/04/11 1218 06/04/11 0605 05/31/11 1645  NA 140 140 137 139 136 -- -- -- --  K 4.4 4.4 -- -- -- -- -- -- --  CL -- -- 109 -- 105 104 102 104 --  CO2 -- -- 19 -- 25 23 21 22  --  BUN -- -- 21 -- 22 25* 23 23 --  CREATININE -- -- 1.44*  -- 1.57* 1.58* 1.51* 1.51* --  CALCIUM -- -- 7.7* -- 9.2 9.2 9.1 9.2 --  MG -- -- -- -- -- -- -- -- --  PHOS -- -- -- -- -- -- -- -- 4.0   A:  Baseline renal insufficiency.  No acute issues. P: -->  BMP, Mg, Phos in AM  GASTROINTESTINAL  Lab 06/04/11 1218 06/03/11 0444 06/02/11 0435 05/31/11 1100  AST 36 38* 33 20  ALT 11 8 8 7   ALKPHOS 96 84 83 115  BILITOT 0.3 0.3 0.3 0.1*  PROT 6.7 6.4 6.4 7.8  ALBUMIN 2.6* 2.6* 2.8* 3.6   A:  No acute issues. P: -->  No interventions required  HEMATOLOGIC  Lab 06/06/11 1650 06/06/11 1553 06/06/11 1547 06/06/11 1010 06/06/11 0518 06/05/11 0620 06/04/11 1218 06/04/11 0605  HGB 9.2* 9.2* 8.6* 8.5* 8.9* -- -- --  HCT 27.0* 27.0* 25.9* 25.0* 26.9* -- -- --  PLT -- -- 359 -- 415* 378 364 342  INR -- -- -- -- -- -- -- 1.13  APTT -- -- -- -- -- -- -- 33   A:  Postoperative anemia, stable. P: -->  CBC in AM  INFECTIOUS  Lab 06/06/11 1547 06/06/11 0518 06/05/11 0620 06/04/11 1218 06/04/11 0605  WBC 25.4* 12.0* 13.1* 15.0* 14.6*  PROCALCITON -- -- -- -- --   A:  No clear source of infection.  Suspected aspiration pneumonia. P: -->  Empirical Vancomycin / Zosyn, would d/c in 48 hours if infiltrates resolved / afebrile -->  Check PCT   ENDOCRINE No results found for this basename: GLUCAP:5 in the last 168 hours A:  No active issues. P: -->  No interventions required  BEST PRACTICE / DISPOSITION -->  ICU status under Vascular -->  PCCM consulting -->  Full code -->  NPO -->  SCDs for DVT Px -->  Pepcid for GI Px  Penne Lash, M.D., F.C.C.P. [facility] [place] [facility]: [phone] Pager: [phone]  06/06/2011, 7:58 PM

## 2011-06-06 NOTE — Progress Notes (Signed)
Dr. Linna Caprice at bedside, orders received for cbc, bmet and abg.  Cbc bmp and abg drawn.

## 2011-06-06 NOTE — Progress Notes (Signed)
Updated Dr. Linna Caprice on pts condition.  Dr. Linna Caprice stated he was okay with her transferring to the unit.

## 2011-06-06 NOTE — Op Note (Addendum)
Procedure: Right femoral to below knee popliteal bypass with Propaten reversed greater saphenous vein composite  Transmetatarsal amputation of left first toe  Preoperative diagnosis: Gangrene left first toe  Postoperative diagnosis: Same  Anesthesia: General  Asst.: Evorn Gong, PA-C, Gerri Lins, PA-c  Operative findings:     Distal 10 cm of graft reversed saphenous vein               6 mm ring supported PTFE    Strong peroneal, present but weaker DP/PT signal at end of case  Operative details: After obtaining informed consent, the patient was taken to the operating room. The patient was placed in supine position on the operating room table. After induction of general anesthesia and endotracheal intubation, a Foley catheter was placed. Next, the patient's entire left lower extremity was prepped and draped in the usual sterile fashion.     A longitudinal incision was then made in the left groin and carried down through the subcutaneous tissues to expose the left common femoral artery. The common femoral artery was dissected free circumferentially. There was a pulse within the common femoral artery. The distal external iliac artery was dissected free circumferentially underneath the inguinal ligament.  A vessel loop was also placed around the distal external iliac artery. The profunda femoris and superficial femoral arteries were also dissected free circumferentially as well and vessel loops placed around these.  There was some calcification in the superficial  femoral artery. The common femoral and profunda femoris were soft.  Next the saphenofemoral junction was identified in the medial portion of the groin incision and this was harvested through an additional skip incisions on the medial aspect of the leg.  Side branches were ligated and divided between silk ties or clips.  The vein was of good quality 3 mm diameter but had an early bifurcation and only about 7 cm of usable vein  harvested.  The saphenofemoral junction was ligated with a 2 0 silk tie and the distal end of the vein ligated and the vein examined and flushed thoroughly with heparinized saline.    An incision was made on the medial aspect of the leg below the knee.  The incision was deepened and the below knee popliteal space was entered.  The popliteal artery was dissected free circumferentially.  It was thickened on palpation but was clampable. A composite graft was then constructed by placing the vein in a reverse configuration and spatulating it.  A 6 mm ring supported Propaten graft was then beveled on one end and sewn end to end to the vein using a 6 0 prolene.  The patient was then given 10000 units of heparin.  The below-knee popliteal artery was controlled proximally with a Henley clamp and distally with a small Bulldog clamp. A longitudinal opening was made in the distal below-knee popliteal artery in an area that was fairly free of calcification. The vein was then cut to length and sewn end of vein to side of artery using running 6-0 Prolene suture.  At completion of the anastomosis everything was forebled backbled and thoroughly flushed. The remainder of the anastomosis was completed and all clamps were removed there was good back bleeding into the vein.    A tunnel was then created between the heads of the gastrocnemius muscle subsartorial up to the groin.  The graft was then brought between the heads of the gastrocnemius subsartorial to the groin.  The patient was given an additional 3000 units of heparin.  The distal  left external iliac artery was controlled with a Henley clamp. The profunda was controlled with a vessel loop and the SFA controlled with a fistula clamp.   A longitudinal opening was made in the common femoral artery on its anterior surface.  The vessel was soft.  The 6 mm ring supported Propaten graft was then spatulated.  The graft was then sewn end of graft to side of artery using a running 5 0  Prolene.  Proximal clamp and distal clamps were removed and there was good pulsatile flow in the graft immediately.  There was monophasic flow in the posterior tibial and dorsalis pedis areas of the foot.  There was much more brisk flow in the peroneal at the ankle.  2 repair stitches were placed in the medial wall of the distal anastomosis.  Hemostasis was obtained with thrombin and gelfoam and the assistance of 50 mg of protamine.  Each anastomosis was then packed with thrombin and gelfoam until hemostasis was obtained.  The wounds were then irrigated and the gelfoam removed.  After hemostasis was obtained, the deep layers and subcutaneous layers of the below-knee popliteal incision were closed with running 2-0 Vicryl suture. The skin was closed with staples.   The saphenectomy incisions were closed with running 2 0 vicryl followed by staples.  The groin was inspected and found to be hemostatic. This was then closed in multiple layers of running 2 0 and 3-0 Vicryl suture and 4-0 subcuticular stitch. Dermabond was applied to the groin incision.     Attention was then turned to the left first toe.  A tennis racket shaped incision was made incorporating the first metatarsal.  The metatarsal was divided with bone cutters.  The toe was removed. The bone was made smooth with rongeurs.  The tendons were transected and allow to retract into the foot.  The wound was thoroughly irrigated and hemostasis obtained.  The skin edges were reapproximated with 3 0 Nylon sutures.   The patient tolerated the procedure well and there were no complications.A small 7 0 prolene needle was missing from the counts but this was not seen on xray to rule out foreign body and presumed off the field.   Instrument sponge and needle counts were otherwise correct in the case. An arteriogram was not performed due to the patients low GFR and advanced age putting her at risk for renal dysfunction. The patient was taken to the recovery room in  stable condition.   [name], MD [facility] Office: [phone] Pager: [phone]

## 2011-06-06 NOTE — Anesthesia Procedure Notes (Addendum)
Procedure Name: Intubation Date/Time: 06/06/2011 8:55 AM Performed by: Carola Frost Pre-anesthesia Checklist: Patient identified, Emergency Drugs available, Suction available, Patient being monitored and Timeout performed Patient Re-evaluated:Patient Re-evaluated prior to inductionOxygen Delivery Method: Circle system utilized Preoxygenation: Pre-oxygenation with 100% oxygen Intubation Type: Combination inhalational/ intravenous induction Ventilation: Mask ventilation without difficulty Laryngoscope Size: Miller and 2 Grade View: Grade I Tube type: Oral Tube size: 7.0 mm Number of attempts: 1 Airway Equipment and Method: Stylet and Bite block Placement Confirmation: ETT inserted through vocal cords under direct vision,  positive ETCO2,  CO2 detector and breath sounds checked- equal and bilateral Secured at: 21 cm Tube secured with: Tape Dental Injury: Teeth and Oropharynx as per pre-operative assessment

## 2011-06-06 NOTE — Anesthesia Preprocedure Evaluation (Addendum)
Anesthesia Evaluation  Patient identified by MRN, date of birth, ID band Patient awake    Reviewed: Allergy & Precautions, H&P , NPO status , Patient's Chart, lab work & pertinent test results  Airway Mallampati: II TM Distance: >3 FB     Dental  (+) Dental Advisory Given and Poor Dentition   Pulmonary former smoker breath sounds clear to auscultation        Cardiovascular hypertension, Pt. on home beta blockers + Peripheral Vascular Disease Rhythm:Regular     Neuro/Psych    GI/Hepatic   Endo/Other    Renal/GU ARFRenal disease     Musculoskeletal   Abdominal   Peds  Hematology   Anesthesia Other Findings   Reproductive/Obstetrics                          Anesthesia Physical Anesthesia Plan  ASA: III  Anesthesia Plan: General   Post-op Pain Management:    Induction: Intravenous  Airway Management Planned: Oral ETT  Additional Equipment:   Intra-op Plan:   Post-operative Plan: Extubation in OR  Informed Consent: I have reviewed the patients History and Physical, chart, labs and discussed the procedure including the risks, benefits and alternatives for the proposed anesthesia with the patient or authorized representative who has indicated his/her understanding and acceptance.   Dental advisory given  Plan Discussed with: CRNA, Anesthesiologist and Surgeon  Anesthesia Plan Comments:         Anesthesia Quick Evaluation

## 2011-06-07 ENCOUNTER — Inpatient Hospital Stay (HOSPITAL_COMMUNITY): Payer: Medicare Other

## 2011-06-07 DIAGNOSIS — J9819 Other pulmonary collapse: Secondary | ICD-10-CM

## 2011-06-07 DIAGNOSIS — J159 Unspecified bacterial pneumonia: Secondary | ICD-10-CM

## 2011-06-07 DIAGNOSIS — I739 Peripheral vascular disease, unspecified: Secondary | ICD-10-CM

## 2011-06-07 DIAGNOSIS — J9600 Acute respiratory failure, unspecified whether with hypoxia or hypercapnia: Secondary | ICD-10-CM

## 2011-06-07 LAB — CBC
HCT: 22.1 % — ABNORMAL LOW (ref 36.0–46.0)
Hemoglobin: 7.4 g/dL — ABNORMAL LOW (ref 12.0–15.0)
MCH: 30.2 pg (ref 26.0–34.0)
MCHC: 33.5 g/dL (ref 30.0–36.0)
MCV: 90.2 fL (ref 78.0–100.0)
Platelets: 348 10*3/uL (ref 150–400)
RBC: 2.45 MIL/uL — ABNORMAL LOW (ref 3.87–5.11)
RDW: 14.4 % (ref 11.5–15.5)
WBC: 32.3 10*3/uL — ABNORMAL HIGH (ref 4.0–10.5)

## 2011-06-07 LAB — BASIC METABOLIC PANEL
BUN: 21 mg/dL (ref 6–23)
CO2: 20 mEq/L (ref 19–32)
Calcium: 8.3 mg/dL — ABNORMAL LOW (ref 8.4–10.5)
Chloride: 106 mEq/L (ref 96–112)
Creatinine, Ser: 1.53 mg/dL — ABNORMAL HIGH (ref 0.50–1.10)
GFR calc Af Amer: 40 mL/min — ABNORMAL LOW (ref >90)
GFR calc non Af Amer: 34 mL/min — ABNORMAL LOW (ref >90)
Glucose, Bld: 109 mg/dL — ABNORMAL HIGH (ref 70–99)
Potassium: 4.5 mEq/L (ref 3.5–5.1)
Sodium: 136 mEq/L (ref 135–145)

## 2011-06-07 LAB — HEMOGLOBIN A1C
Hgb A1c MFr Bld: 5.7 % — ABNORMAL HIGH (ref <5.7)
Mean Plasma Glucose: 117 mg/dL — ABNORMAL HIGH (ref <117)

## 2011-06-07 IMAGING — CR DG CHEST 1V PORT
1 series · 1 of 1 positions shown · non-contrast
Comparison: Chest x-ray [DATE].

CLINICAL DATA: Respiratory distress after fem-pop bypass.

PORTABLE CHEST - 1 VIEW

[AP]
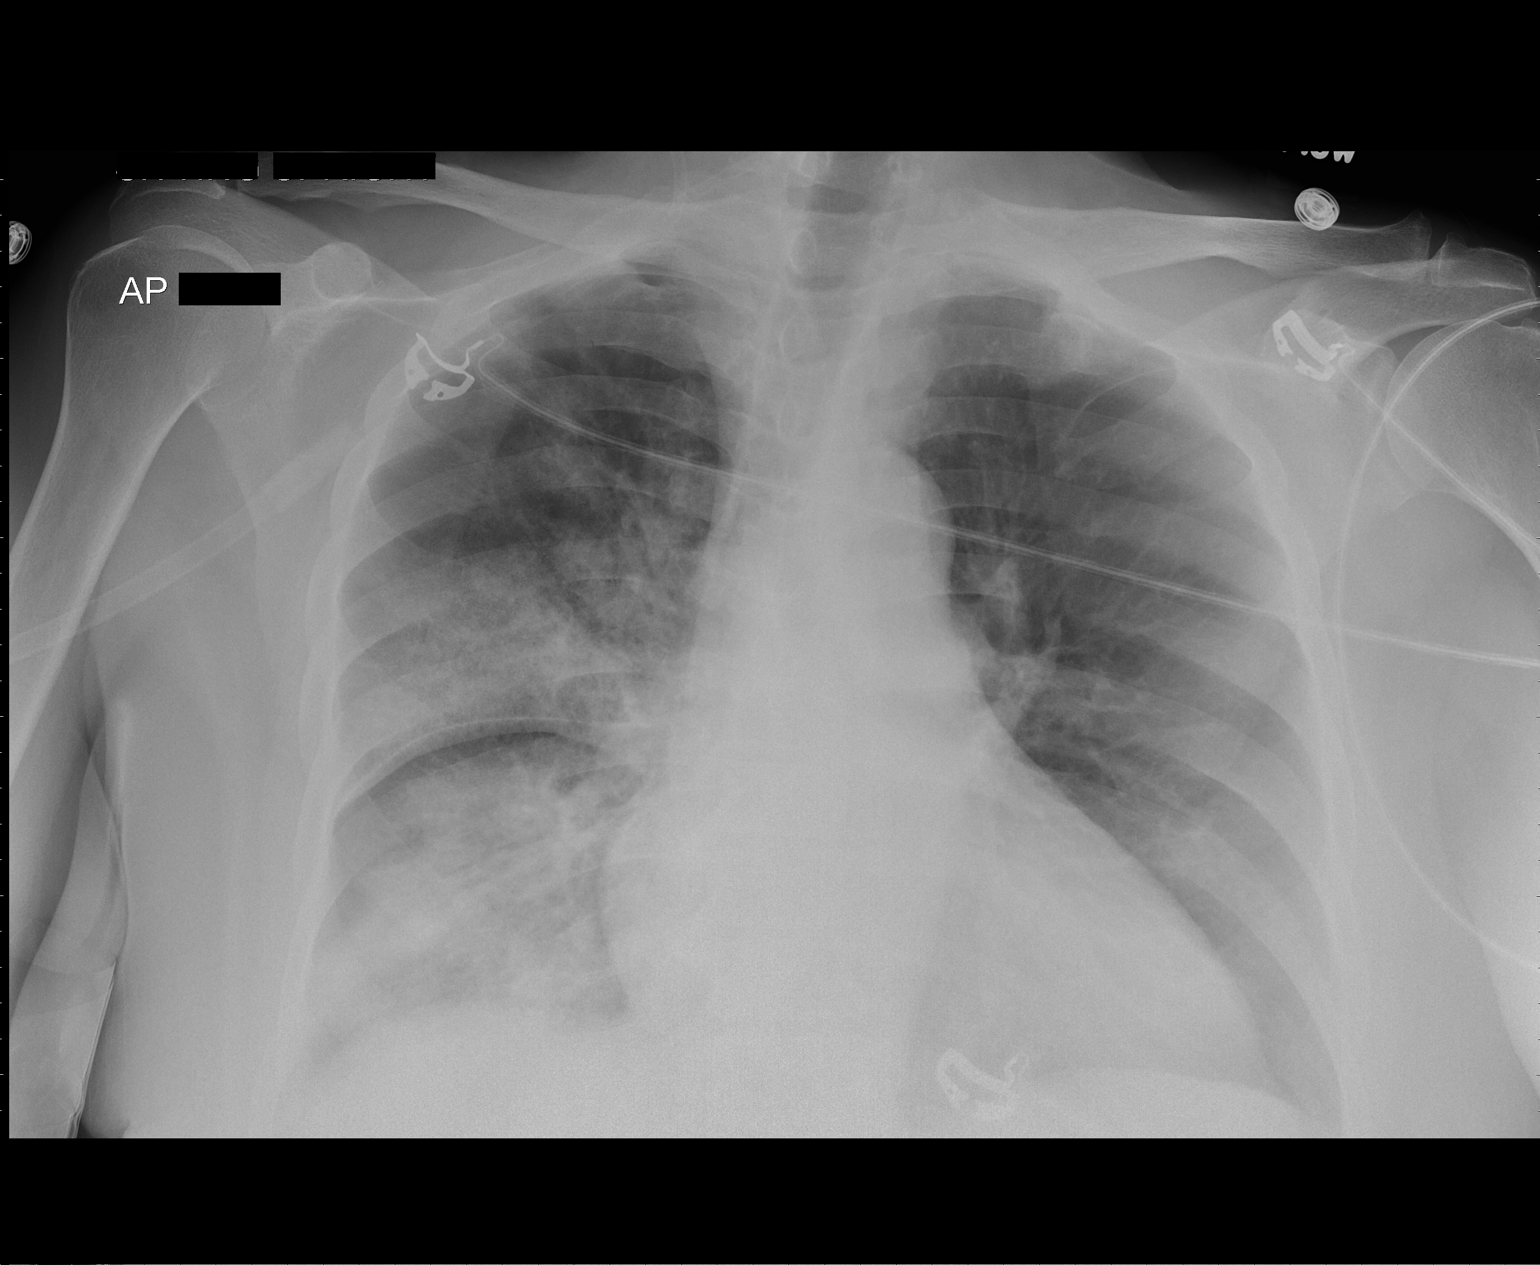

[1 of 1 positions shown; findings below may reference images not displayed]

FINDINGS: There continue to be areas of airspace consolidation
throughout the right mid and lower lung, concerning for pneumonia
or severe aspiration pneumonitis.  Left lung appears relatively
clear.  Mild blunting of the right costophrenic sulcus is favored
to be technique related (as the film is slightly under penetrated),
however, a small right-sided pleural effusion is difficult to
exclude.  Heart size is upper limits of normal. The patient is
rotated to the left on today's exam, resulting in distortion of the
mediastinal contours and reduced diagnostic sensitivity and
specificity for mediastinal pathology.
IMPRESSION: 1.  Persistent to extensive air space consolidation throughout the
right mid and lower lung concerning for aspiration
pneumonia/pneumonitis.
2.  Possible small right-sided pleural effusion.

## 2011-06-07 MED ORDER — ASPIRIN 325 MG PO TABS
325.0000 mg | ORAL_TABLET | Freq: Every day | ORAL | Status: DC
  Administered 2011-06-07 – 2011-06-09 (×3): 325 mg via ORAL
  Filled 2011-06-07 (×4): qty 1

## 2011-06-07 MED ORDER — SODIUM CHLORIDE 0.9 % IV SOLN
3.0000 g | Freq: Four times a day (QID) | INTRAVENOUS | Status: AC
  Administered 2011-06-07 – 2011-06-08 (×4): 3 g via INTRAVENOUS
  Filled 2011-06-07 (×4): qty 3

## 2011-06-07 NOTE — Progress Notes (Signed)
200cc voided post foley removal at 8am. Will continue to monitor.

## 2011-06-07 NOTE — Progress Notes (Signed)
ANTIBIOTIC CONSULT NOTE - INITIAL  Pharmacy Consult for Unasyn x24 hours Indication: aspiration pneumonitis  No Known Allergies  Patient Measurements: Height: 5\' 8"  (172.7 cm) Weight: 212 lb 11.9 oz (96.5 kg) IBW/kg (Calculated) : 63.9   Vital Signs: Temp: 97.7 F (36.5 C) (03/07 0804) Temp src: Oral (03/07 0804) BP: 138/61 mmHg (03/07 0900) Pulse Rate: 100  (03/07 0800) Intake/Output from previous day: 03/06 0701 - 03/07 0700 In: 4025 [P.O.:600; I.V.:2400; IV Piggyback:1025] Out: 2065 [Urine:1940; Blood:125] Intake/Output from this shift: Total I/O In: 300 [P.O.:150; I.V.:100; IV Piggyback:50] Out: 30 [Urine:30]  Labs:  [facility] 06/07/11 0400 06/06/11 1650 06/06/11 1553 06/06/11 1547 06/06/11 0518  WBC 32.3* -- -- 25.4* 12.0*  HGB 7.4* 9.2* 9.2* -- --  PLT 348 -- -- 359 415*  LABCREA -- -- -- -- --  CREATININE 1.53* -- -- 1.44* 1.57*   Estimated Creatinine Clearance: 43.9 ml/min (by C-G formula based on Cr of 1.53).  Basename 06/05/11 1704  VANCOTROUGH 30.2*  VANCOPEAK --  Jake Michaelis --  GENTTROUGH --  GENTPEAK --  GENTRANDOM --  TOBRATROUGH --  TOBRAPEAK --  TOBRARND --  AMIKACINPEAK --  AMIKACINTROU --  AMIKACIN --     Microbiology: Recent Results (from the past 720 hour(s))  URINE CULTURE     Status: Normal   Collection Time   05/31/11  3:22 PM      Component Value Range Status Comment   Specimen Description URINE, CLEAN CATCH   Final    Special Requests NONE   Final    Culture  Setup Time [id number]   Final    Colony Count NO GROWTH   Final    Culture NO GROWTH   Final    Report Status 06/01/2011 FINAL   Final   CULTURE, BLOOD (ROUTINE X 2)     Status: Normal (Preliminary result)   Collection Time   06/04/11 12:25 PM      Component Value Range Status Comment   Specimen Description BLOOD LEFT ARM   Final    Special Requests BOTTLES DRAWN AEROBIC AND ANAEROBIC 10CC   Final    Culture  Setup Time [id number]   Final    Culture     Final    Value:        BLOOD CULTURE RECEIVED NO GROWTH TO DATE CULTURE WILL BE HELD FOR 5 DAYS BEFORE ISSUING A FINAL NEGATIVE REPORT   Report Status PENDING   Incomplete   CULTURE, BLOOD (ROUTINE X 2)     Status: Normal (Preliminary result)   Collection Time   06/04/11 12:33 PM      Component Value Range Status Comment   Specimen Description BLOOD LEFT HAND   Final    Special Requests BOTTLES DRAWN AEROBIC AND ANAEROBIC 10CC   Final    Culture  Setup Time [id number]   Final    Culture     Final    Value:        BLOOD CULTURE RECEIVED NO GROWTH TO DATE CULTURE WILL BE HELD FOR 5 DAYS BEFORE ISSUING A FINAL NEGATIVE REPORT   Report Status PENDING   Incomplete   SURGICAL PCR SCREEN     Status: Normal   Collection Time   06/06/11  3:30 AM      Component Value Range Status Comment   MRSA, PCR NEGATIVE  NEGATIVE  Final    Staphylococcus aureus NEGATIVE  NEGATIVE  Final     Medical History: Past Medical History  Diagnosis Date  .  Hypertension   . Hernia     Medications:  Scheduled:    . albuterol  2.5 mg Nebulization Once  . amLODipine  10 mg Oral Daily  . docusate sodium  100 mg Oral Daily  . famotidine (PEPCID) IV  20 mg Intravenous Q12H  . feeding supplement  237 mL Oral TID WC  . furosemide  10 mg Intravenous Once  . morphine      . vancomycin  750 mg Intravenous Q24H  . DISCONTD: albuterol  2.5 mg Nebulization Q4H  . DISCONTD: aspirin EC  325 mg Oral Daily  . DISCONTD: cefUROXime (ZINACEF)  IV  1.5 g Intravenous Q12H  . DISCONTD: enoxaparin  40 mg Subcutaneous QHS  . DISCONTD: ipratropium  0.5 mg Nebulization Q4H  . DISCONTD: nicotine  14 mg Transdermal Daily  . DISCONTD: pantoprazole  40 mg Oral Q1200  . DISCONTD: piperacillin-tazobactam (ZOSYN)  IV  3.375 g Intravenous Q8H  . DISCONTD: piperacillin-tazobactam (ZOSYN)  IV  3.375 g Intravenous Q8H  . DISCONTD: vancomycin  750 mg Intravenous Q24H   Assessment: 66 y/o female patient s/p left first toe amputation, now with likely  aspiration pneumonitis requiring broad spectrum antibiotics x24 hours. Scr elevated but crcl ~44, adequate for q6h dosing.  Plan:  Unasyn 3g IV q6h x4 doses and d/c.  Davonna Belling, PharmD, BCPS Pager [phone] 06/07/2011,9:50 AM

## 2011-06-07 NOTE — Progress Notes (Signed)
[facility]  Subjective  - POD #1 Left fem pop with first toe amp, some leg soreness, respiratory function better overnight, ? Aspiration by chest xray with leukocytosis  Objective 149/64 100 97.7 F (36.5 C) (Oral) 14 93%  Intake/Output Summary (Last 24 hours) at 06/07/11 0850 Last data filed at 06/07/11 [id number]  Gross per 24 hour  Intake   4155 ml  Output   2095 ml  Net   2060 ml   Left foot warm, no obvious hematoma  Very comfortable no work of breaathing  Assessment/Planning: Appreciate critical care assitance.  Will stop Vanc and Zosyn and switch to Unasyn for possible pneumonia.  If clinically improved will consider d/c tomorrow otherwise full 10 day course D/c a line D/c foley Ambulate with Darco shoe Transfer to [facility] 06/07/2011 8:50 AM --  Laboratory Lab Results:  Basename 06/07/11 0400 06/06/11 1650 06/06/11 1547  WBC 32.3* -- 25.4*  HGB 7.4* 9.2* --  HCT 22.1* 27.0* --  PLT 348 -- 359   BMET  Basename 06/07/11 0400 06/06/11 1650 06/06/11 1547  NA 136 140 --  K 4.5 4.4 --  CL 106 -- 109  CO2 20 -- 19  GLUCOSE 109* -- 98  BUN 21 -- 21  CREATININE 1.53* -- 1.44*  CALCIUM 8.3* -- 7.7*    COAG Lab Results  Component Value Date   INR 1.13 06/04/2011   No results found for this basename: PTT    Antibiotics Anti-infectives     Start     Dose/Rate Route Frequency Ordered Stop   06/06/11 2200  piperacillin-tazobactam (ZOSYN) IVPB 3.375 g       3.375 g 12.5 mL/hr over 240 Minutes Intravenous 3 times per day 06/06/11 1911 06/07/11 2159   06/06/11 2000   vancomycin (VANCOCIN) 750 mg in sodium chloride 0.9 % 150 mL IVPB  Status:  Discontinued        750 mg 150 mL/hr over 60 Minutes Intravenous Every 24 hours 06/05/11 2019 06/06/11 1911   06/06/11 2000   vancomycin (VANCOCIN) 750 mg in sodium chloride 0.9 % 150 mL IVPB        750 mg 150 mL/hr over 60 Minutes Intravenous Every 24 hours 06/06/11 1911  06/06/11 2141   06/06/11 1915   cefUROXime (ZINACEF) 1.5 g in dextrose 5 % 50 mL IVPB  Status:  Discontinued        1.5 g 100 mL/hr over 30 Minutes Intravenous Every 12 hours 06/06/11 1911 06/06/11 2014   06/03/11 1800   vancomycin (VANCOCIN) 750 mg in sodium chloride 0.9 % 150 mL IVPB  Status:  Discontinued        750 mg 150 mL/hr over 60 Minutes Intravenous Every 12 hours 06/03/11 1608 06/05/11 2019   06/03/11 1400   vancomycin (VANCOCIN) 750 mg in sodium chloride 0.9 % 150 mL IVPB  Status:  Discontinued        750 mg 150 mL/hr over 60 Minutes Intravenous Every 12 hours 06/03/11 1336 06/03/11 1608   06/01/11 2200   vancomycin (VANCOCIN) 750 mg in sodium chloride 0.9 % 150 mL IVPB  Status:  Discontinued        750 mg 150 mL/hr over 60 Minutes Intravenous Every 24 hours 05/31/11 1744 06/03/11 1336   06/01/11 0200   piperacillin-tazobactam (ZOSYN) IVPB 3.375 g  Status:  Discontinued        3.375 g 12.5 mL/hr over 240 Minutes Intravenous 3 times per day  05/31/11 1744 06/06/11 1911   05/31/11 1800   vancomycin (VANCOCIN) IVPB 1000 mg/200 mL premix        1,000 mg 200 mL/hr over 60 Minutes Intravenous  Once 05/31/11 1744 05/31/11 1928   05/31/11 1800  piperacillin-tazobactam (ZOSYN) IVPB 3.375 g       3.375 g 100 mL/hr over 30 Minutes Intravenous  Once 05/31/11 1744 05/31/11 1829

## 2011-06-07 NOTE — Significant Event (Signed)
Pt transferred to 2032, VS stable prior and during the transfer. All pt belongings taken to new room. Pt daughters aware of the transfer. Receiving RN in room. Salley Boxley, Therapist, sports.

## 2011-06-07 NOTE — Progress Notes (Signed)
Orthopedic Tech Progress Note Patient Details:  Stephanie Griffith [birth year] [id number]  Other Ortho Devices Ortho Device Location: draco shoe Ortho Device Interventions: Application   [name] 06/07/2011, 9:43 AM

## 2011-06-07 NOTE — Progress Notes (Signed)
Anesthesiology follow up:  Ms Demby looks better today, now alert respirations unlabored. Moved to 2000 today.  CXR shows persistent R. Lung airspace disease.  VS: T-36.5 BP 149/63 RR 14 HR 100 O2 Sat 93% on RA.  Impression: Probable post-op aspiration, improved clinically  Now on IV unasyn per Dr. Oneida Alar.  Roberts Gaudy, MD

## 2011-06-07 NOTE — Progress Notes (Signed)
Name: Stephanie Griffith MRN: [id number] DOB: Feb 24, 1946    LOS: 7  PCCM CONSULTATION NOTE  History of Present Illness: 66 y/o with smoker PVD and left first toe gangrene, s/p popliteal bypass and toe amputation.  Extubated in OR but noted to have labored shallow breathing in PACU. CXR revealed R>L airspace disease.  Required BiPAP transiently, but now on VTM 35% saturating 99-100.  Feels weak but denies dyspnea or chest pain.  Lines / Drains: 3/6  Foley 3/6  A-line  Cultures: 3/4  Blood>>>NTD  Antibiotics: 3/6  Cefuroxime x 2 3/6  Vancomycin x 1 3/6  Zosyn  Tests / Events: 3/6  Femoropopliteal bypass, first left toe amputation, transient postop respiratory failure  3/7  Refused BIPAP overnight, comfortable this morning    Vital Signs: Temp:  [97.2 F (36.2 C)-99.5 F (37.5 C)] 97.7 F (36.5 C) (03/07 0804) Pulse Rate:  [93-205] 100  (03/07 0800) Resp:  [14-37] 14  (03/07 0800) BP: (117-199)/(56-103) 149/64 mmHg (03/07 0800) SpO2:  [87 %-100 %] 93 % (03/07 0800) Arterial Line BP: (143-275)/(54-90) 192/57 mmHg (03/07 0800) FiO2 (%):  [0 %-100 %] 50 % (03/06 2000) Weight:  [96.5 kg (212 lb 11.9 oz)] 96.5 kg (212 lb 11.9 oz) (03/07 0600) I/O last 3 completed shifts: In: [id number] [P.O.:600; I.V.:3015; IV Piggyback:1125] Out: 2365 [Urine:2240; Blood:125]  Physical Examination:  Gen: well appearing, comfortable, sitting up in chair HEENT: NCAT, PERRL, EOmi PULM: Insp crackles in bases bilaterally, worse in R lung CV: Slightly tachycardic, S3 noted, no JVD AB: BS+, soft, nontender Ext: trace edema, SCD on R leg, L foot well bandaged and dressed Neuro: A&Ox4, MAEW  Ventilator settings: Vent Mode:  [-]  FiO2 (%):  [0 %-100 %] 50 %  Labs and Imaging:   CBC    Component Value Date/Time   WBC 32.3* 06/07/2011 0400   RBC 2.45* 06/07/2011 0400   HGB 7.4* 06/07/2011 0400   HCT 22.1* 06/07/2011 0400   PLT 348 06/07/2011 0400   MCV 90.2 06/07/2011 0400   MCH 30.2 06/07/2011 0400   MCHC 33.5  06/07/2011 0400   RDW 14.4 06/07/2011 0400   LYMPHSABS 1.3 05/31/2011 1100   MONOABS 1.3* 05/31/2011 1100   EOSABS 0.1 05/31/2011 1100   BASOSABS 0.1 05/31/2011 1100    BMET    Component Value Date/Time   NA 136 06/07/2011 0400   K 4.5 06/07/2011 0400   CL 106 06/07/2011 0400   CO2 20 06/07/2011 0400   GLUCOSE 109* 06/07/2011 0400   BUN 21 06/07/2011 0400   CREATININE 1.53* 06/07/2011 0400   CALCIUM 8.3* 06/07/2011 0400   GFRNONAA 34* 06/07/2011 0400   GFRAA 40* 06/07/2011 0400     ASSESSMENT AND PLAN  This is a 66 y/o female admitted on 2/28 to OSH for a gangrenous left toe.  PCCM was consulted for post op respiratory distress.  Found to have a RLL infiltrate and required bipap transiently after extubation.  On 3/7 she looks dramatically better (95% RA, comfortable) despite markedly abnormal CXR findings.  NEUROLOGIC A:  No active issues.  Good pain control. P: -->  Morphine / Oxycodone for pain  PULMONARY  Lab 06/06/11 1650 06/06/11 1553  PHART 7.386 7.376  PCO2ART 31.2* 32.5*  PO2ART 53.0* 46.0*  HCO3 18.9* 19.2*  O2SAT 88.0 83.0   A:  Transient (acute) postoperative hypoxemic respiratory failure.  Atelectasis vs aspiration pneumonitis.  Less likely pneumonia.  Doubt pulmonary edema.  History of smoking.  Possibly COPD but no  evidence of chronic respiratory failure. CXR 3/6 >>> Airspace disease R>L.  Much improved clinical status this morning, so likely aspiration pneumonitis. P: -->  Bronchodilators -->  Incentive spirometry -->  ABX per ID section -->  CXR in AM -->  Supplemental oxygen, maintain SpO2> 92%  CARDIOVASCULAR  Lab 06/06/11 1502 05/31/11 1049  TROPONINI <0.30 --  LATICACIDVEN -- 0.9  PROBNP -- --   A:  No arrhythmia / ischemia.  Hemodynamically stable.  History of Hypertension.  Status post femoropopliteal bypass. P: -->  Per Vascular -->  Antihypertensives -->  Follow cardiac enzymes  RENAL  Lab 06/07/11 0400 06/06/11 1650 06/06/11 1553 06/06/11 1547 06/06/11  1010 06/06/11 0518 06/05/11 0620 06/04/11 1218 05/31/11 1645  NA 136 140 140 137 139 -- -- -- --  K 4.5 4.4 -- -- -- -- -- -- --  CL 106 -- -- 109 -- 105 104 102 --  CO2 20 -- -- 19 -- 25 23 21  --  BUN 21 -- -- 21 -- 22 25* 23 --  CREATININE 1.53* -- -- 1.44* -- 1.57* 1.58* 1.51* --  CALCIUM 8.3* -- -- 7.7* -- 9.2 9.2 9.1 --  MG -- -- -- -- -- -- -- -- --  PHOS -- -- -- -- -- -- -- -- 4.0   A:  Baseline renal insufficiency.  No acute issues. P: -->  BMP, Mg, Phos in AM  GASTROINTESTINAL  Lab 06/04/11 1218 06/03/11 0444 06/02/11 0435 05/31/11 1100  AST 36 38* 33 20  ALT 11 8 8 7   ALKPHOS 96 84 83 115  BILITOT 0.3 0.3 0.3 0.1*  PROT 6.7 6.4 6.4 7.8  ALBUMIN 2.6* 2.6* 2.8* 3.6   A:  No acute issues. P: -->  No interventions required  HEMATOLOGIC  Lab 06/07/11 0400 06/06/11 1650 06/06/11 1553 06/06/11 1547 06/06/11 1010 06/06/11 0518 06/05/11 0620 06/04/11 1218 06/04/11 0605  HGB 7.4* 9.2* 9.2* 8.6* 8.5* -- -- -- --  HCT 22.1* 27.0* 27.0* 25.9* 25.0* -- -- -- --  PLT 348 -- -- 359 -- 415* 378 364 --  INR -- -- -- -- -- -- -- -- 1.13  APTT -- -- -- -- -- -- -- -- 33   A:  Postoperative anemia, stable. P: -->  CBC in AM  INFECTIOUS  Lab 06/07/11 0400 06/06/11 2054 06/06/11 1547 06/06/11 0518 06/05/11 0620 06/04/11 1218  WBC 32.3* -- 25.4* 12.0* 13.1* 15.0*  PROCALCITON -- 9.08 -- -- -- --   A:  No clear source of infection.  Suspected aspiration pneumonitis. P: -->  Will continue antibiotics for 24 hours with unasyn (I will order today) -->  Check PCT   ENDOCRINE No results found for this basename: GLUCAP:5 in the last 168 hours A:  No active issues. P: -->  No interventions required  PCCM will sign off, please call if questions or if condition worsens  BEST PRACTICE / DISPOSITION -->  ICU status under Vascular -->  PCCM consulting -->  Full code -->  NPO -->  SCDs for DVT Px -->  Pepcid for GI Px  Jillyn Hidden PCCM Pager: [phone] If no  response, call [phone] 06/07/2011, 8:25 AM

## 2011-06-07 NOTE — Addendum Note (Signed)
Addendum  created 06/07/11 1824 by Janeece Riggers, MD   Modules edited:Notes Section

## 2011-06-08 ENCOUNTER — Encounter (HOSPITAL_COMMUNITY): Payer: Self-pay | Admitting: Physician Assistant

## 2011-06-08 ENCOUNTER — Other Ambulatory Visit: Payer: Self-pay

## 2011-06-08 DIAGNOSIS — I4891 Unspecified atrial fibrillation: Secondary | ICD-10-CM

## 2011-06-08 LAB — PREPARE RBC (CROSSMATCH)

## 2011-06-08 LAB — BASIC METABOLIC PANEL
BUN: 23 mg/dL (ref 6–23)
CO2: 22 mEq/L (ref 19–32)
Calcium: 8.5 mg/dL (ref 8.4–10.5)
Chloride: 103 mEq/L (ref 96–112)
Creatinine, Ser: 1.67 mg/dL — ABNORMAL HIGH (ref 0.50–1.10)
GFR calc Af Amer: 36 mL/min — ABNORMAL LOW (ref >90)
GFR calc non Af Amer: 31 mL/min — ABNORMAL LOW (ref >90)
Glucose, Bld: 105 mg/dL — ABNORMAL HIGH (ref 70–99)
Potassium: 4.6 mEq/L (ref 3.5–5.1)
Sodium: 135 mEq/L (ref 135–145)

## 2011-06-08 LAB — CARDIAC PANEL(CRET KIN+CKTOT+MB+TROPI)
CK, MB: 5.9 ng/mL — ABNORMAL HIGH (ref 0.3–4.0)
Relative Index: 0.7 (ref 0.0–2.5)
Total CK: 854 U/L — ABNORMAL HIGH (ref 7–177)
Troponin I: 0.45 ng/mL (ref <0.30)

## 2011-06-08 MED ORDER — DIGOXIN 0.25 MG/ML IJ SOLN
0.1250 mg | INTRAMUSCULAR | Status: DC
  Administered 2011-06-08: 0.125 mg via INTRAVENOUS
  Filled 2011-06-08 (×3): qty 0.5

## 2011-06-08 MED ORDER — FUROSEMIDE 10 MG/ML IJ SOLN
20.0000 mg | Freq: Once | INTRAMUSCULAR | Status: DC
  Filled 2011-06-08: qty 2

## 2011-06-08 MED ORDER — FAMOTIDINE 20 MG PO TABS
20.0000 mg | ORAL_TABLET | Freq: Every day | ORAL | Status: DC
  Administered 2011-06-08 – 2011-06-15 (×7): 20 mg via ORAL
  Filled 2011-06-08 (×9): qty 1

## 2011-06-08 MED ORDER — FUROSEMIDE 10 MG/ML IJ SOLN
40.0000 mg | Freq: Two times a day (BID) | INTRAMUSCULAR | Status: DC
  Administered 2011-06-08 – 2011-06-09 (×2): 40 mg via INTRAVENOUS
  Filled 2011-06-08 (×4): qty 4

## 2011-06-08 MED ORDER — DIGOXIN 125 MCG PO TABS: 0.1250 mg | ORAL_TABLET | Freq: Every day | ORAL | Status: DC

## 2011-06-08 MED ORDER — ENOXAPARIN SODIUM 100 MG/ML ~~LOC~~ SOLN
100.0000 mg | Freq: Two times a day (BID) | SUBCUTANEOUS | Status: DC
  Administered 2011-06-08: 100 mg via SUBCUTANEOUS
  Filled 2011-06-08 (×3): qty 1

## 2011-06-08 MED ORDER — METOPROLOL TARTRATE 25 MG PO TABS
25.0000 mg | ORAL_TABLET | Freq: Four times a day (QID) | ORAL | Status: DC
  Administered 2011-06-08 – 2011-06-09 (×3): 25 mg via ORAL
  Filled 2011-06-08 (×7): qty 1

## 2011-06-08 MED ORDER — ENOXAPARIN SODIUM 60 MG/0.6ML ~~LOC~~ SOLN
50.0000 mg | SUBCUTANEOUS | Status: DC
  Administered 2011-06-09: 50 mg via SUBCUTANEOUS
  Filled 2011-06-08: qty 0.6

## 2011-06-08 MED ORDER — METOPROLOL TARTRATE 25 MG PO TABS
25.0000 mg | ORAL_TABLET | Freq: Two times a day (BID) | ORAL | Status: DC
  Administered 2011-06-08: 25 mg via ORAL
  Filled 2011-06-08 (×2): qty 1

## 2011-06-08 MED ORDER — DIGOXIN 0.25 MG/ML IJ SOLN
0.5000 mg | Freq: Once | INTRAMUSCULAR | Status: AC
  Administered 2011-06-08: 0.5 mg via INTRAVENOUS

## 2011-06-08 MED ORDER — METOPROLOL TARTRATE 1 MG/ML IV SOLN
INTRAVENOUS | Status: AC
  Filled 2011-06-08: qty 5

## 2011-06-08 NOTE — Progress Notes (Signed)
ANTICOAGULATION CONSULT NOTE - Initial Consult  Pharmacy Consult for Lovenox Indication: atrial fibrillation  No Known Allergies  Patient Measurements: Height: 5\' 8"  (172.7 cm) Weight: 212 lb 11.9 oz (96.5 kg) IBW/kg (Calculated) : 63.9   Vital Signs: BP: 93/56 mmHg (03/08 0930) Pulse Rate: 160  (03/08 0658)  Labs:  Basename 06/07/11 0400 06/06/11 1650 06/06/11 1553 06/06/11 1547 06/06/11 1502 06/06/11 0518  HGB 7.4* 9.2* -- -- -- --  HCT 22.1* 27.0* 27.0* -- -- --  PLT 348 -- -- 359 -- 415*  APTT -- -- -- -- -- --  LABPROT -- -- -- -- -- --  INR -- -- -- -- -- --  HEPARINUNFRC -- -- -- -- -- --  CREATININE 1.53* -- -- 1.44* -- 1.57*  CKTOTAL -- -- -- -- 343* --  CKMB -- -- -- -- 3.3 --  TROPONINI -- -- -- -- <0.30 --   Estimated Creatinine Clearance: 43.9 ml/min (by C-G formula based on Cr of 1.53).  Medical History: Past Medical History  Diagnosis Date  . Hypertension   . Hernia     Assessment: 66 yo F s/p amputation of gangrenous left toe 3/7.  Now in atrial fibrillation to begin anticoagulation with Lovenox.  Transfusing 2 units prbcs for post-op blood loss.  CrCl ~44 ml/min, wt=96kg.  Goal of Therapy:  anti-Xa 0.6-1.2 units/ml   Plan:  1.  Lovenox 100 mg SQ q12hr 2.  CBC q72 hours while on Lovenox 3.  F/U oral anticoagulation plan  Edwyna Shell, Pharm.D., BCPS Clinical Pharmacist Pager: [phone]

## 2011-06-08 NOTE — Progress Notes (Addendum)
ANTICOAGULATION CONSULT NOTE - Initial Consult  Pharmacy Consult:  Lovenox Indication:  VTE prophylaxis  No Known Allergies  Patient Measurements: Height: 5\' 8"  (172.7 cm) Weight: 212 lb 11.9 oz (96.5 kg) IBW/kg (Calculated) : 63.9   Vital Signs: Temp: 98.2 F (36.8 C) (03/08 1547) Temp src: Oral (03/08 1547) BP: 147/77 mmHg (03/08 1547) Pulse Rate: 89  (03/08 1547)  Labs:  Basename 06/08/11 1225 06/08/11 1224 06/07/11 0400 06/06/11 1650 06/06/11 1553 06/06/11 1547 06/06/11 1502 06/06/11 0518  HGB -- -- 7.4* 9.2* -- -- -- --  HCT -- -- 22.1* 27.0* 27.0* -- -- --  PLT -- -- 348 -- -- 359 -- 415*  APTT -- -- -- -- -- -- -- --  LABPROT -- -- -- -- -- -- -- --  INR -- -- -- -- -- -- -- --  HEPARINUNFRC -- -- -- -- -- -- -- --  CREATININE -- 1.67* 1.53* -- -- 1.44* -- --  CKTOTAL 854* -- -- -- -- -- 343* --  CKMB 5.9* -- -- -- -- -- 3.3 --  TROPONINI 0.45* -- -- -- -- -- <0.30 --   Estimated Creatinine Clearance: 40.2 ml/min (by C-G formula based on Cr of 1.67).  Medical History: Past Medical History  Diagnosis Date  . Hypertension   . Hernia   . Chronic renal insufficiency     Cr 1.38-1.93 in 2009    Assessment: 58 YOF s/p amputation of gangrenous left toe on 06/07/11.  Patient was in Afib and full-dose Lovenox was started, now in NSR and Afib is likely d/t acute stress of illness per MD.  Lovenox to decrease to VTE px dose.  Will use 0.5mg /kg/dose dosing for BMI > 30.  Renal function appropriate for dosing.  Goal of Therapy:  anti-Xa 0.6-1.2 units/ml   Plan:  - Decrease Lovenox to 50mg  SQ daily, start 06/09/11 - Continue CBC Q72H   Johnnette Gourd, PharmD 06/08/2011  Addendum: Pt's updated weight =82.6kg.  Change to Lovenox 40mg  sq daily. Pharmacy to sign off. Netta Cedars, PharmD, BCPS

## 2011-06-08 NOTE — Consult Note (Signed)
CARDIOLOGY CONSULT NOTE  Patient ID: Stephanie Griffith, MRN: [id number], DOB/AGE: 1945/06/24 66 y.o. Admit date: 05/31/2011 Date of Consult: 06/08/2011  Primary Care Doctor: Has not seen anyone in a while Primary Cardiologist: New to [facility]  Chief Complaint: n/v, toe discoloration Reason for Consult: atrial fibrillation with RVR  HPI: 66 y/o F with hx of HTN, CKD, recent L big toe infection & no prior cardiac hx presented with complaints of abdominal pain, nausea, vomiting, chills and L toe discoloration. On admit, Na 128, BUN 84, Cr 2.97, lipase modestly elevated at 72, WBC 16, Hgb 8.4. CT abd/pelvis showed no acute abnormalities, but did show cholelithasis & anterior abdominal wall hernia containing colon. She also complained of toe discoloration which appeared to be dry gangrene so IV antibiotics were started. LE arterial duplex US were checked showing R ABI normal but L ABI 0.17, with significant stenosis throughout the proximal to mid femoral artery, occlusion of the distal femoral artery and popliteal artery, with recanalized flow seen in mid to distal popliteal artery as well as occluded posterior tibial artery and dampened monophasic flow of the anterior tibial artery. She was seen by vascular with plans for angio after transfer to [facility]. She was also seen by GI and ultimately her abdominal sx were felt secondary to cholelithiasis and cholecystitis, although without significant acute cholecystitis - any surgery consideration was felt to be deferred until after vascular issues had been addressed. She underwent PV angio 06/04/11 demonstrating significant PVD and ultimately underwent femoral to below knee popliteal bypass 06/06/11 with transmetatarsal amputation of the left first toe. Post-procedurally on 06/06/11 in the recovery room she was noted to be tachpneic and with labored shallow respirations, requiring bipap with CXR revealing R lung asymmetric edema and volume loss & question of  atelectasis  vs aspiration pneumonitis. PCCM was consulted and recommended bronchodilators, abx, incentive spirometry.  Cardiac enzymes were obtained showing elevated CK but negative MB. Today she was noted to have gone into atrial fibrillation with RVR HR's 160s-180s. She denies any knowledge of this, and was asymptomatic. She denies any CP, palpitations, SOB, dizziness or syncope either here or at home prior this this hospitalization. She did have an episode of dime-sized hemoptysis this morning before noon which is unusual for her. She denies any orthopnea but weights have gone from 170 (possibly stated on 2/28) -> 201lb ->212 lbs (06/07/11). WBC is 32k, Hgb has gone from 9.2 to 7.4, felt to be post-surgical blood loss. Lovenox was initiated and the vascular team started digoxin and metoprolol. She converted to NSR around 11 this morning but rate remains slightly elevated with HR's 90's-100s.   Past Medical History  Diagnosis Date  . Hypertension   . Hernia   . Chronic renal insufficiency     Cr 1.38-1.93 in 2009  She denies any cardiac hx. In 2009 she was seen by PCP for CP and referred for stress testing but the patient doesn't recall any workup or going for the test.  Surgical History:  Past Surgical History  Procedure Date  . Tubal ligation   . Femoral-popliteal bypass graft 06/06/2011    Procedure: BYPASS GRAFT FEMORAL-POPLITEAL ARTERY;  Surgeon: Elam Dutch, MD;  Location: [facility] OR;  Service: Vascular;  Laterality: Left;  left femoral to popliteal bypass with composite vein and propaten 35mm x 80 graft  . Amputation 06/06/2011    Procedure: AMPUTATION DIGIT;  Surgeon: Elam Dutch, MD;  Location: [facility];  Service: Vascular;  Laterality: Left;  Transmetatarsal amputation left great toe     Home Meds: Was not taking anything recently. Med list was supposed to include: Prior to Admission medications   Medication Sig Start Date End Date Taking? Authorizing Provider  aspirin 81 MG EC tablet  Take 81 mg by mouth daily.      Historical Provider, MD  hydrochlorothiazide 25 MG tablet Take 25 mg by mouth daily.      Historical Provider, MD  metoprolol (LOPRESSOR) 25 MG tablet Take 25 mg by mouth 2 (two) times daily.      Historical Provider, MD    Inpatient Medications:    . amLODipine  10 mg Oral Daily  . ampicillin-sulbactam (UNASYN) IV  3 g Intravenous Q6H  . aspirin  325 mg Oral Daily  . digoxin  0.125 mg Intravenous Q4H  . digoxin  0.5 mg Intravenous Once  . digoxin  0.125 mg Oral Daily  . docusate sodium  100 mg Oral Daily  . enoxaparin (LOVENOX) injection  100 mg Subcutaneous Q12H  . famotidine  20 mg Oral Daily  . feeding supplement  237 mL Oral TID WC  . furosemide  20 mg Intravenous Once  . metoprolol      . metoprolol tartrate  25 mg Oral BID  . DISCONTD: famotidine (PEPCID) IV  20 mg Intravenous Q12H    Allergies: No Known Allergies  History   Social History  . Marital Status: Widowed    Spouse Name: N/A    Number of Children: N/A  . Years of Education: N/A   Occupational History  . Not on file.   Social History Main Topics  . Smoking status: Current Everyday Smoker -- 2.0 packs/day for 50 years  . Smokeless tobacco: Never Used  . Alcohol Use: No  . Drug Use: No  . Sexually Active:    Other Topics Concern  . Not on file   Social History Narrative  . No narrative on file     Family History  Problem Relation Age of Onset  . Heart attack Father   . Heart attack Brother      Review of Systems: General: negative for fever, night sweats. See above re: weight changes, chills Cardiovascular: negative for chest pain, orthopnea, palpitations, paroxysmal nocturnal dyspnea, shortness of breath or dyspnea on exertion. See above Dermatological: negative for rash Respiratory: occasional cough Urologic: negative for hematuria Abdominal: negative for diarrhea, bright red blood per rectum, melena, or hematemesis. +n/v on admission Neurologic: negative  for visual changes, syncope, or dizziness All other systems reviewed and are otherwise negative except as noted above.  Labs:  [facility] 06/06/11 1502  CKTOTAL 343*  CKMB 3.3  TROPONINI <0.30   Lab Results  Component Value Date   WBC 32.3* 06/07/2011   HGB 7.4* 06/07/2011   HCT 22.1* 06/07/2011   MCV 90.2 06/07/2011   PLT 348 06/07/2011    Lab 06/07/11 0400 06/04/11 1218  NA 136 --  K 4.5 --  CL 106 --  CO2 20 --  BUN 21 --  CREATININE 1.53* --  CALCIUM 8.3* --  PROT -- 6.7  BILITOT -- 0.3  ALKPHOS -- 96  ALT -- 11  AST -- 36  GLUCOSE 109* --   Radiology/Studies:  1. CT Abdomen Pelvis Wo Contrast 05/31/2011  *RADIOLOGY REPORT*  Clinical Data: Pain with nausea and vomiting.  CT ABDOMEN AND PELVIS WITHOUT CONTRAST  Technique:  Multidetector CT imaging of the abdomen and pelvis was performed following the standard protocol without  intravenous contrast.  Comparison: 08/30/2008  Findings: There are multiple gallstones.  Gallbladder wall is not thickened.  No dilated bile ducts.  Liver, spleen, pancreas, and kidneys are normal.  Bilateral benign adrenal hyperplasia, stable.  Midline abdominal hernia containing a portion of the transverse colon without evidence of obstruction and without change.  The numerous diverticula in the distal descending and sigmoid portion of the colon without diverticulitis.  Terminal ileum and appendix are normal.  Uterus and ovaries are normal.  Small amount of nonspecific free fluid in the pelvic cul-de-sac.  No significant osseous abnormality.  IMPRESSION: No acute abnormalities of the abdomen or pelvis.  Cholelithiasis. Benign adrenal hyperplasia.  Anterior abdominal wall hernia containing colon.  Diverticulosis.  Original Report Authenticated By: Larey Seat, M.D.   2. Chest 2 View 06/02/2011  *RADIOLOGY REPORT*  Clinical Data: Preop hernia repair  CHEST - 2 VIEW  Comparison: Chest radiograph 12/21/2007  Findings: Normal cardiac silhouette with the ectatic aorta.   Lungs are clear.  No effusion, infiltrate, pneumothorax. Degenerative osteophytosis of the thoracic spine.  IMPRESSION: No acute cardiopulmonary process.  Original Report Authenticated By: Suzy Bouchard, M.D.   3. US Abdomen Complete /05/2011  *RADIOLOGY REPORT*  Clinical Data:  Upper abdominal pain, nausea, gallstones  COMPLETE ABDOMINAL ULTRASOUND  Comparison:  CT 05/31/2011  Findings:  Gallbladder:  There are several gallstones in the gallbladder neck ranging in size from 6-10 mm.  There approximately 6 to 8 stones. The gallbladder wall is thickened to 6 mm.  There is no pericholecystic fluid.  Negative sonographic Murphy's sign.  Common bile duct:  Upper limits of normal at 6 mm  Liver:  No focal lesion identified.  Within normal limits in parenchymal echogenicity.  IVC:  Appears normal.  Pancreas:  No focal abnormality seen.  Spleen:  Normal in size and echogenicity  Right Kidney:  9.7cm in length.  No evidence of hydronephrosis or stones.  Left Kidney:  8.8cm in length.  No evidence of hydronephrosis or stones.  Abdominal aorta:  No aneurysm identified.  IMPRESSION:  Gallstones with gallbladder wall thickening are signs concerning for acute cholecystitis.  Negative sonographic Murphy's sign and no pericholecystic fluid weigh against cholecystitis.  Recommend clinical correlation for acute cholecystitis and consider nuclear medicine HIDA scan for increased specificity if clinical pictures is equivocal.  Original Report Authenticated By: Suzy Bouchard, M.D.   4. Chest Port 1 View 06/07/2011  *RADIOLOGY REPORT*  Clinical Data: Respiratory distress after fem-pop bypass.  PORTABLE CHEST - 1 VIEW  Comparison: Chest x-ray 06/06/2011.  Findings: There continue to be areas of airspace consolidation throughout the right mid and lower lung, concerning for pneumonia or severe aspiration pneumonitis.  Left lung appears relatively clear.  Mild blunting of the right costophrenic sulcus is favored to be technique related  (as the film is slightly under penetrated), however, a small right-sided pleural effusion is difficult to exclude.  Heart size is upper limits of normal. The patient is rotated to the left on today's exam, resulting in distortion of the mediastinal contours and reduced diagnostic sensitivity and specificity for mediastinal pathology.  IMPRESSION: 1.  Persistent to extensive air space consolidation throughout the right mid and lower lung concerning for aspiration pneumonia/pneumonitis. 2.  Possible small right-sided pleural effusion.  Original Report Authenticated By: Etheleen Mayhew, M.D.  5. PV Angio 06/04/11 FINDINGS:  1. Infrarenal aortic, bilateral common iliac arteries, and bilateral external iliac arteries, and bilateral hypogastric arteries are patent.  2. On the left side,  there is a moderate stenosis in the proximal superficial femoral artery. There is moderate disease at the adductor canal which then occludes with reconstitution of a small above-knee popliteal artery. This him mild to moderate disease of the pop 2 artery behind the knee. The below-knee pop artery is patent although small. This single arterial runoff on the left via the anterior tibial artery. The peroneal and posterior tibial arteries are occluded.  3. On the right side, the common femoral, superficial femoral, deep femoral,, popliteal, and proximal tibial vessels are patent. His poor visualization distally because CO2 was used and I did not want to use any more contrast.   EKG:  1. 06/02/11 88bpm NSR TWI III, TW flattening avF 2. 06/08/11 5:07am - atrial fib with RVR 163bpm, anterior infarct age undetermined. Nonspecific ST-T changes, possible ST depression V5.   Physical Exam: Blood pressure 159/61, pulse 97, temperature 98.2 F (36.8 C), temperature source Oral, resp. rate 20, height 5\' 8"  (1.727 m), weight 212 lb 11.9 oz (96.5 kg), SpO2 92.00%. General: Well developed, well nourished AAF, uncomfortable appearing in no acute  distress. Head: Normocephalic, atraumatic, sclera non-icteric, no xanthomas, nares are without discharge.  Neck: Negative for carotid bruits. JVD not elevated. Lungs: Coarse crackles bilaterally. No wheezing or rhonchi. Breathing is unlabored. She is comfortably laying about 45 degrees without any SOB. Heart: Reg rhythm, slightly tachycardic with S1 S2. Flow-sounding murmur at LLSB ?possibly at apex. No rubs or gallops appreciated. Abdomen: Soft, non-tender, non-distended with normoactive bowel sounds. No hepatomegaly. No rebound/guarding. No obvious abdominal masses. Msk:  Strength and tone appear normal for age. Extremities: No clubbing or cyanosis. Tr-1+ edema RLE, SCD in place. Stapled incision LLE without erythema, dehiscence or suppuration. 1+ pedal pulse on the RLE. LLE is wrapped due to toe amputation. Neuro: Alert and oriented X 3. Moves all extremities spontaneously. Psych:  Responds to questions appropriately with a normal affect.   Assessment and Plan:   1. Dry toe gangrene with PVD s/p femoral to below knee popliteal bypass 06/06/11 - being managed by surgery. Given significant vascular disease, will check lipid panel for possible risk factor modification.  2. Acute respiratory failure post-op felt secondary to aspiration pneumonitis temporarily requiring bipap 06/06/11, improving.  3. Newly diagnosed atrial fibrillation with RVR, currently in NSR - likely secondary to acute stress of illness. Would recommend scheduled metoprolol q6hours for now. Would avoid digoxin given inconsistent renal dysfunction. If RVR recurs, may consider using IV amiodarone. With ongoing anemia, recent surgery & brief hemoptysis, would hold off on full-dose anticoagulation and will decrease to DVT ppx dosing for now. Continue ASA. This may need to be readdressed after echo results.  4. Hemoptysis brief episode this AM - May due to acute CHF as weights have increased and she has rales on exam. If this recurs,  could consider r/o for PE. Follow H/H.  5. Acute CHF - patient appears volume overloaded on exam and weight has increased. Check 2D echo. Will initiate scheduled IV Lasix at 40mg  bid. Follow strict I&O's, daily weights.   6. Acute on chronic renal insufficiency - Cr slightly increased today. Follow closely with diuresis.  7. Abd pain/nausea/vomiting - felt by GI/gensurg possibly related to GB, for outpatient consideration of elective surgery.  Signed, Melina Copa PA-C 06/08/2011, 1:31 PM   Patient seen, examined. Available data reviewed. Agree with findings, assessment, and plan as outlined by Melina Copa, PA-C. Pt independently interviewed and examined. Main cardiac issues at present are AF with RVR  and acute CHF with volume overload. She is currently back in sinus rhythm. Will give her metoprolol 25 mg every 6 hours as above and use IV Amio if she has a recurrence. Await 2D echo. Will carefully diurese with IV lasix and watch creatinine daily. Also will change lovenox to 'DVT-dose' since she is having hemoptysis and continue to follow Hgb/Hct. Will follow along with you. Thanks for asking [place] to see her.  Sherren Mocha, M.D. 06/08/2011 4:23 PM

## 2011-06-08 NOTE — Progress Notes (Signed)
CRITICAL VALUE ALERT  Critical value receivedTroponin  Date of notification:  t  Time of notification:  n  Critical value read back:yes  Nurse who received alert:  Hazle Nordmann RN  MD notified (1st page):  Evorn Gong PA for DR. Fields  Time of first page:  1405  MD notified (2nd page):  Time of second page:  Responding MD:  Evorn Gong PA  Time MD responded:  [phone]

## 2011-06-08 NOTE — Evaluation (Signed)
Physical Therapy Evaluation Patient Details Name: Stephanie Griffith MRN: [id number] DOB: [birth year] Today's Date: 06/08/2011  Problem List:  Patient Active Problem List  Diagnoses  . ANEMIA-NOS  . Essential hypertension, benign  . Chronic kidney disease (CKD), stage III (moderate)  . CHEST PAIN, HX OF  . Dry gangrene  . Abdominal pain  . Leukocytosis  . Hyponatremia  . Hyperglycemia  . Acute on chronic renal failure  . PAD (peripheral artery disease)  . Cholelithiasis and cholecystitis without obstruction  . Ventral hernia    Past Medical History:  Past Medical History  Diagnosis Date  . Hypertension   . Hernia    Past Surgical History:  Past Surgical History  Procedure Date  . Tubal ligation     PT Assessment/Plan/Recommendation PT Assessment Clinical Impression Statement: Pt s/p LLE for fempop and 1toe amp. Pt encouraged to perform OOB with nursing and HEP over weekend. Pt with limited mobility today and will benefit from acute therapy to maximize mobility, gait and activity tolerance before discharge. PT Recommendation/Assessment: Patient will need skilled PT in the acute care venue PT Problem List: Decreased strength;Decreased range of motion;Decreased activity tolerance;Decreased mobility;Decreased knowledge of precautions;Decreased knowledge of use of DME PT Therapy Diagnosis : Difficulty walking;Abnormality of gait;Acute pain PT Plan PT Frequency: Min 3X/week PT Treatment/Interventions: Gait training;DME instruction;Functional mobility training;Therapeutic activities;Therapeutic exercise;Patient/family education PT Recommendation Recommendations for Other Services: OT consult Follow Up Recommendations: Home health PT;Supervision for mobility/OOB Equipment Recommended: Rolling walker with 5" wheels PT Goals  Acute Rehab PT Goals PT Goal Formulation: With patient Time For Goal Achievement: 2 weeks Pt will go Supine/Side to Sit: with supervision;with HOB 0 degrees PT  Goal: Supine/Side to Sit - Progress: Goal set today Pt will go Sit to Supine/Side: with supervision;with HOB 0 degrees PT Goal: Sit to Supine/Side - Progress: Goal set today Pt will go Sit to Stand: with supervision PT Goal: Sit to Stand - Progress: Goal set today Pt will go Stand to Sit: with supervision PT Goal: Stand to Sit - Progress: Goal set today Pt will Transfer Bed to Chair/Chair to Bed: with supervision PT Transfer Goal: Bed to Chair/Chair to Bed - Progress: Goal set today Pt will Ambulate: 51 - 150 feet;with supervision;with least restrictive assistive device PT Goal: Ambulate - Progress: Goal set today Pt will Go Up / Down Stairs: 3-5 stairs;with min assist;with least restrictive assistive device PT Goal: Up/Down Stairs - Progress: Goal set today Pt will Perform Home Exercise Program: with supervision, verbal cues required/provided PT Goal: Perform Home Exercise Program - Progress: Goal set today  PT Evaluation Precautions/Restrictions  Precautions Required Braces or Orthoses: Yes Other Brace/Splint: Darco shoe Restrictions Weight Bearing Restrictions: Yes Other Position/Activity Restrictions: heel weight bearing Prior Functioning  Home Living Lives With: Spouse Type of Home: House Home Layout: One level Home Access: Stairs to enter Entrance Stairs-Rails: Left;Right;Can reach both Entrance Stairs-Number of Steps: 3 Home Adaptive Equipment: None Prior Function Level of Independence: Independent with basic ADLs;Independent with transfers;Independent with homemaking with ambulation;Independent with gait Driving: No Vocation: Retired Comments: Pt lives with spouse but plans to D/C to dgtr's apt with 3 steps who can provide 24hr care Cognition Cognition Arousal/Alertness: Awake/alert Overall Cognitive Status: Appears within functional limits for tasks assessed Orientation Level: Oriented X4 Cognition - Other Comments: Pt slow to problem solve gait and new  instructions for mobility Sensation/Coordination Sensation Light Touch: Not tested (sensitive to touch LLE) Extremity Assessment LLE Assessment LLE Assessment: Exceptions to WFL LLE AROM (degrees) LLE  Overall AROM Comments: grossly WFL for ROM, slow to move and grossly 3/5 due to postop discomfort did not resist Mobility (including Balance) Bed Mobility Bed Mobility: Yes Supine to Sit: 4: Min assist;HOB elevated (Comment degrees);With rails (HOB 20degrees) Supine to Sit Details (indicate cue type and reason): increased time and encouragement to get pt to advance LLE to EOB max cueing to sequence Sitting - Scoot to Edge of Bed: 5: Supervision Sitting - Scoot to Edge of Bed Details (indicate cue type and reason): cueing for reciprocal scooting Transfers Transfers: Yes Sit to Stand: 4: Min assist;From elevated surface;From bed Sit to Stand Details (indicate cue type and reason): cues for hand placement, sequence and anterior translation Stand to Sit: 4: Min assist;With armrests Stand to Sit Details: cueing for safety and sequence Stand Pivot Transfers: 4: Min assist Stand Pivot Transfer Details (indicate cue type and reason): cueing for sequence and to maintain heel weight bearing pt trying to rock forward onto Left toes. 2-3 pivotal steps bed to chair with RW Ambulation/Gait Ambulation/Gait: No Stairs: No  Posture/Postural Control Posture/Postural Control: No significant limitations Exercise  General Exercises - Lower Extremity Long Arc Quad: AROM;Left;10 reps;Seated End of Session PT - End of Session Equipment Utilized During Treatment: Gait belt Activity Tolerance: Patient limited by fatigue Patient left: in chair;with call bell in reach Nurse Communication: Mobility status for transfers General Behavior During Session: [facility] for tasks performed Cognition: [facility] for tasks performed  Melford Aase 06/08/2011, 11:47 AM  Lanetta Inch, [place]

## 2011-06-08 NOTE — Progress Notes (Addendum)
[facility] Progress Note  06/08/2011 10:03 AM POD 2  Subjective:  No complaints this am Went into A. Fib this am.  Afebrile yesterday-no temp documented since last pm.  HR 110-160 irreg   Filed Vitals:   06/08/11 0658  BP: 147/98  Pulse: 160  Temp:   Resp:     Physical Exam: Lungs:  CTAB; non labored. Cardiac:  irreg irreg Incisions:  LLE incision is c/d/i with staples in place.  Left groin wound covered and bandage is clean. Extremities:  BLE are warm.  CBC    Component Value Date/Time   WBC 32.3* 06/07/2011 0400   RBC 2.45* 06/07/2011 0400   HGB 7.4* 06/07/2011 0400   HCT 22.1* 06/07/2011 0400   PLT 348 06/07/2011 0400   MCV 90.2 06/07/2011 0400   MCH 30.2 06/07/2011 0400   MCHC 33.5 06/07/2011 0400   RDW 14.4 06/07/2011 0400   LYMPHSABS 1.3 05/31/2011 1100   MONOABS 1.3* 05/31/2011 1100   EOSABS 0.1 05/31/2011 1100   BASOSABS 0.1 05/31/2011 1100    BMET    Component Value Date/Time   NA 136 06/07/2011 0400   K 4.5 06/07/2011 0400   CL 106 06/07/2011 0400   CO2 20 06/07/2011 0400   GLUCOSE 109* 06/07/2011 0400   BUN 21 06/07/2011 0400   CREATININE 1.53* 06/07/2011 0400   CALCIUM 8.3* 06/07/2011 0400   GFRNONAA 34* 06/07/2011 0400   GFRAA 40* 06/07/2011 0400    INR    Component Value Date/Time   INR 1.13 06/04/2011 0605     Intake/Output Summary (Last 24 hours) at 06/08/11 1003 Last data filed at 06/08/11 0900  Gross per 24 hour  Intake    700 ml  Output   1350 ml  Net   -650 ml     Assessment/Plan:  66 y.o. female is s/p Right femoral to below knee popliteal bypass with Propaten reversed greater saphenous vein composite  Transmetatarsal amputation of left first toe  POD 2  -AFib--start lovenox.  Digoxin was started this am.  Will start Beta Blocker.  Cardiology consult. -PT/OT consult -check labs in am -acute surgical blood loss anemia--will transfuse 2 PRBCs -continue ABx  Evorn Gong, PA-C [facility] [phone] 06/08/2011 10:03 AM  Agree with above Patent bypass Peroneal doppler Toe amp healing  Will d/c home when HR stable and more ambulatory Possibly Monday.  [name], MD [facility] [place] Office: [phone] Pager: [phone]

## 2011-06-08 NOTE — Progress Notes (Signed)
Utilization review completed. Rozanna Boer, RN, BSN.  06/08/11

## 2011-06-09 ENCOUNTER — Other Ambulatory Visit: Payer: Self-pay

## 2011-06-09 DIAGNOSIS — I214 Non-ST elevation (NSTEMI) myocardial infarction: Secondary | ICD-10-CM

## 2011-06-09 DIAGNOSIS — I319 Disease of pericardium, unspecified: Secondary | ICD-10-CM

## 2011-06-09 DIAGNOSIS — I5031 Acute diastolic (congestive) heart failure: Secondary | ICD-10-CM

## 2011-06-09 LAB — CBC
HCT: 27.9 % — ABNORMAL LOW (ref 36.0–46.0)
Hemoglobin: 9.4 g/dL — ABNORMAL LOW (ref 12.0–15.0)
MCH: 28.9 pg (ref 26.0–34.0)
MCHC: 33.7 g/dL (ref 30.0–36.0)
MCV: 85.8 fL (ref 78.0–100.0)
Platelets: 331 10*3/uL (ref 150–400)
RBC: 3.25 MIL/uL — ABNORMAL LOW (ref 3.87–5.11)
RDW: 17 % — ABNORMAL HIGH (ref 11.5–15.5)
WBC: 26.3 10*3/uL — ABNORMAL HIGH (ref 4.0–10.5)

## 2011-06-09 LAB — TYPE AND SCREEN
ABO/RH(D): B POS
Antibody Screen: NEGATIVE
Unit division: 0
Unit division: 0

## 2011-06-09 LAB — LIPID PANEL
Cholesterol: 94 mg/dL (ref 0–200)
HDL: 32 mg/dL — ABNORMAL LOW (ref >39)
LDL Cholesterol: 45 mg/dL (ref 0–99)
Total CHOL/HDL Ratio: 2.9 RATIO
Triglycerides: 85 mg/dL (ref <150)
VLDL: 17 mg/dL (ref 0–40)

## 2011-06-09 LAB — BASIC METABOLIC PANEL
BUN: 24 mg/dL — ABNORMAL HIGH (ref 6–23)
CO2: 24 mEq/L (ref 19–32)
Calcium: 8.8 mg/dL (ref 8.4–10.5)
Chloride: 104 mEq/L (ref 96–112)
Creatinine, Ser: 1.63 mg/dL — ABNORMAL HIGH (ref 0.50–1.10)
GFR calc Af Amer: 37 mL/min — ABNORMAL LOW (ref >90)
GFR calc non Af Amer: 32 mL/min — ABNORMAL LOW (ref >90)
Glucose, Bld: 91 mg/dL (ref 70–99)
Potassium: 4.2 mEq/L (ref 3.5–5.1)
Sodium: 138 mEq/L (ref 135–145)

## 2011-06-09 MED ORDER — ENOXAPARIN SODIUM 40 MG/0.4ML ~~LOC~~ SOLN
40.0000 mg | SUBCUTANEOUS | Status: DC
  Administered 2011-06-10 – 2011-06-11 (×2): 40 mg via SUBCUTANEOUS
  Filled 2011-06-09 (×3): qty 0.4

## 2011-06-09 MED ORDER — FUROSEMIDE 40 MG PO TABS
40.0000 mg | ORAL_TABLET | Freq: Every day | ORAL | Status: DC
  Filled 2011-06-09 (×2): qty 1

## 2011-06-09 MED ORDER — ATORVASTATIN CALCIUM 40 MG PO TABS
40.0000 mg | ORAL_TABLET | Freq: Every day | ORAL | Status: DC
  Administered 2011-06-09 – 2011-06-14 (×6): 40 mg via ORAL
  Filled 2011-06-09 (×7): qty 1

## 2011-06-09 MED ORDER — POTASSIUM CHLORIDE CRYS ER 20 MEQ PO TBCR
20.0000 meq | EXTENDED_RELEASE_TABLET | Freq: Every day | ORAL | Status: DC
  Administered 2011-06-09 – 2011-06-14 (×5): 20 meq via ORAL
  Filled 2011-06-09 (×7): qty 1
  Filled 2011-06-09: qty 2

## 2011-06-09 MED ORDER — METOPROLOL TARTRATE 50 MG PO TABS
50.0000 mg | ORAL_TABLET | Freq: Two times a day (BID) | ORAL | Status: DC
  Administered 2011-06-09: 50 mg via ORAL
  Filled 2011-06-09 (×3): qty 1

## 2011-06-09 NOTE — Progress Notes (Addendum)
[facility] Progress Note  06/09/2011 9:28 AM POD 3  Subjective:  Feels pretty good this am  Afebrile x 24hrs  130-180s sys  90% 2LO2NC  HR 80-100s afib converted to NSR Filed Vitals:   06/09/11 0500  BP: 174/76  Pulse: 97  Temp: 98.1 F (36.7 C)  Resp: 18    Physical Exam: Incisions:  Staples in tact.  Suture line in tact with a small area of separation at the mid portion but no drainage. Extremities:  Left foot warm; motor and sensation in tact.  CBC    Component Value Date/Time   WBC 26.3* 06/09/2011 0625   RBC 3.25* 06/09/2011 0625   HGB 9.4* 06/09/2011 0625   HCT 27.9* 06/09/2011 0625   PLT 331 06/09/2011 0625   MCV 85.8 06/09/2011 0625   MCH 28.9 06/09/2011 0625   MCHC 33.7 06/09/2011 0625   RDW 17.0* 06/09/2011 0625   LYMPHSABS 1.3 05/31/2011 1100   MONOABS 1.3* 05/31/2011 1100   EOSABS 0.1 05/31/2011 1100   BASOSABS 0.1 05/31/2011 1100    BMET    Component Value Date/Time   NA 138 06/09/2011 0625   K 4.2 06/09/2011 0625   CL 104 06/09/2011 0625   CO2 24 06/09/2011 0625   GLUCOSE 91 06/09/2011 0625   BUN 24* 06/09/2011 0625   CREATININE 1.63* 06/09/2011 0625   CALCIUM 8.8 06/09/2011 0625   GFRNONAA 32* 06/09/2011 0625   GFRAA 37* 06/09/2011 0625    INR    Component Value Date/Time   INR 1.13 06/04/2011 0605     Intake/Output Summary (Last 24 hours) at 06/09/11 0928 Last data filed at 06/09/11 0600  Gross per 24 hour  Intake   1455 ml  Output   1000 ml  Net    455 ml     Assessment/Plan:  66 y.o. female is s/p Right femoral to below knee popliteal bypass with Propaten reversed greater saphenous vein composite  Transmetatarsal amputation of left first toe POD 3  -Afib converted to NSR--appreciate cardiology input. -continue to mobilize WBC improved today-continue ABx -Hgb improved after 2 PRBCs yesterday   Evorn Gong, PA-C [facility] [phone] 06/09/2011 9:28 AM  Addendum  I have independently interviewed and examined the  patient, and I agree with the physician assistant's findings.  Will need to watch 1st ray amp site, sutures intact at this point.  R foot is warm, all incision c/d/i.  Postop ABI pending.  Adele Barthel, MD [facility] [place] Office: [phone] Pager: [phone]  06/09/2011, 9:59 AM

## 2011-06-09 NOTE — Progress Notes (Signed)
  Echocardiogram 2D Echocardiogram has been performed.  Laria Grimmett F 06/09/2011, 11:22 AM

## 2011-06-09 NOTE — Progress Notes (Signed)
Physical Therapy Treatment Patient Details Name: Stephanie Griffith MRN: [id number] DOB: [birth year] Today's Date: 06/09/2011  PT Assessment/Plan  PT - Assessment/Plan Comments on Treatment Session: pt motivated but limited by pain and fatigue.  pt notes having 10 children who all can help her, but concern about having steps to enter home.  pt considering ST-rehab prior to D/C home pending continued progress.   PT Plan: Discharge plan remains appropriate;Frequency remains appropriate PT Frequency: Min 3X/week Recommendations for Other Services: OT consult Follow Up Recommendations: Home health PT;Supervision for mobility/OOB Equipment Recommended: Rolling walker with 5" wheels PT Goals  Acute Rehab PT Goals PT Goal: Supine/Side to Sit - Progress: Progressing toward goal PT Goal: Sit to Stand - Progress: Progressing toward goal PT Goal: Stand to Sit - Progress: Progressing toward goal PT Transfer Goal: Bed to Chair/Chair to Bed - Progress: Progressing toward goal PT Goal: Ambulate - Progress: Progressing toward goal  PT Treatment Precautions/Restrictions  Precautions Precautions: Fall Required Braces or Orthoses: Yes Other Brace/Splint: Darco shoe Restrictions Weight Bearing Restrictions: Yes Other Position/Activity Restrictions: Heel only on L LE Mobility (including Balance) Bed Mobility Bed Mobility: Yes Supine to Sit: 4: Min assist;With rails;HOB elevated (Comment degrees) (HOB ~25 degrees) Supine to Sit Details (indicate cue type and reason): A with L LE only.  increased time needed with cueing for sequencing.   Sitting - Scoot to [employer] of Bed: 5: Supervision Transfers Transfers: Yes Sit to Stand: 4: Min assist;With upper extremity assist;From bed Sit to Stand Details (indicate cue type and reason): cues for use of UEs, anterior wt shifting, heel WBing on L LE Stand to Sit: 4: Min assist;With upper extremity assist;With armrests;To chair/3-in-1 Stand to Sit Details: cues for use of  armrests and to control descent Ambulation/Gait Ambulation/Gait: Yes Ambulation/Gait Assistance: 4: Min assist Ambulation/Gait Assistance Details (indicate cue type and reason): cues for sequencing, upright posture, positioning within RW.  pt doing well with heel only on L with Darco shoe.   Ambulation Distance (Feet): 5 Feet Assistive device: Rolling walker Gait Pattern: Step-to pattern;Decreased step length - right;Decreased stance time - left;Trunk flexed;Shuffle Stairs: No Architect: No  Posture/Postural Control Posture/Postural Control: No significant limitations Balance Balance Assessed: No Exercise    End of Session PT - End of Session Equipment Utilized During Treatment: Gait belt Activity Tolerance: Patient limited by fatigue;Patient limited by pain Patient left: in chair;with call bell in reach Nurse Communication: Mobility status for transfers General Behavior During Session: [facility] for tasks performed Cognition: [facility] for tasks performed  Catarina Hartshorn, [place] 06/09/2011, 10:11 AM

## 2011-06-09 NOTE — Progress Notes (Addendum)
Subjective:   Cardiology consulted yesterday for post-op AF with RVR and acute CHF in setting of recent toe amputation. Now back in NSR. Breathing much better. No CP. Lying flat.  Trop minimally elevated at 0.45. ECG normal. I/Os + despite lasix. Weights all over the place and unreliable.   Echo pending.      Intake/Output Summary (Last 24 hours) at 06/09/11 1027 Last data filed at 06/09/11 0600  Gross per 24 hour  Intake   1455 ml  Output   1000 ml  Net    455 ml    Current meds:    . amLODipine  10 mg Oral Daily  . aspirin  325 mg Oral Daily  . docusate sodium  100 mg Oral Daily  . enoxaparin (LOVENOX) injection  50 mg Subcutaneous Q24H  . famotidine  20 mg Oral Daily  . feeding supplement  237 mL Oral TID WC  . furosemide  20 mg Intravenous Once  . furosemide  40 mg Intravenous BID  . metoprolol tartrate  25 mg Oral Q6H  . DISCONTD: digoxin  0.125 mg Intravenous Q4H  . DISCONTD: digoxin  0.125 mg Oral Daily  . DISCONTD: enoxaparin (LOVENOX) injection  100 mg Subcutaneous Q12H  . DISCONTD: metoprolol tartrate  25 mg Oral BID   Infusions:     Objective:  Blood pressure 174/76, pulse 97, temperature 98.1 F (36.7 C), temperature source Oral, resp. rate 18, height 5\' 8"  (1.727 m), weight 82.555 kg (182 lb), SpO2 90.00%. Weight change:   Physical Exam: General:  Lying flat. No resp difficulty HEENT: normal Neck: supple. JVP 7-8 . Carotids 2+ bilat; no bruits. No lymphadenopathy or thryomegaly appreciated. Cor: PMI nondisplaced. Regular rate & rhythm. No rubs, gallops or murmurs. Lungs: clear Abdomen: soft, large ventral hernia No hepatosplenomegaly. No bruits or masses. Good bowel sounds. Extremities: no cyanosis, clubbing, rash, tr edema. Boot on L. Surgical scar well healed. Neuro: alert & orientedx3, cranial nerves grossly intact. moves all 4 extremities w/o difficulty. Affect pleasant  Telemetry:  Sinus tach 100  Lab Results: Basic Metabolic Panel:  Lab  [id number] 06/08/11 1224 06/07/11 0400 06/06/11 1650 06/06/11 1553 06/06/11 1547 06/06/11 1010 06/06/11 0518  NA 138 135 136 140 140 -- -- --  K 4.2 4.6 -- -- -- -- -- --  CL 104 103 106 -- -- 109 -- 105  CO2 24 22 20  -- -- 19 -- 25  GLUCOSE 91 105* 109* -- -- 98 90 --  BUN 24* 23 21 -- -- 21 -- 22  CREATININE 1.63* 1.67* 1.53* -- -- 1.44* -- 1.57*  CALCIUM 8.8 8.5 8.3* -- -- 7.7* -- 9.2  MG -- -- -- -- -- -- -- --  PHOS -- -- -- -- -- -- -- --   Liver Function Tests:  Lab 06/04/11 1218 06/03/11 0444  AST 36 38*  ALT 11 8  ALKPHOS 96 84  BILITOT 0.3 0.3  PROT 6.7 6.4  ALBUMIN 2.6* 2.6*   No results found for this basename: LIPASE:5,AMYLASE:5 in the last 168 hours No results found for this basename: AMMONIA:5 in the last 168 hours CBC:  Lab 06/09/11 0625 06/07/11 0400 06/06/11 1650 06/06/11 1553 06/06/11 1547 06/06/11 0518 06/05/11 0620  WBC 26.3* 32.3* -- -- 25.4* 12.0* 13.1*  NEUTROABS -- -- -- -- -- -- --  HGB 9.4* 7.4* 9.2* 9.2* 8.6* -- --  HCT 27.9* 22.1* 27.0* 27.0* 25.9* -- --  MCV 85.8 90.2 -- -- 89.3 90.3  88.1  PLT 331 348 -- -- 359 415* 378   Cardiac Enzymes:  Lab 06/08/11 1225 06/06/11 1502  CKTOTAL 854* 343*  CKMB 5.9* 3.3  CKMBINDEX -- --  TROPONINI 0.45* <0.30   BNP: No components found with this basename: POCBNP:5 CBG: No results found for this basename: GLUCAP:5 in the last 168 hours Microbiology: Lab Results  Component Value Date   CULT        BLOOD CULTURE RECEIVED NO GROWTH TO DATE CULTURE WILL BE HELD FOR 5 DAYS BEFORE ISSUING A FINAL NEGATIVE REPORT 06/04/2011   CULT        BLOOD CULTURE RECEIVED NO GROWTH TO DATE CULTURE WILL BE HELD FOR 5 DAYS BEFORE ISSUING A FINAL NEGATIVE REPORT 06/04/2011   CULT NO GROWTH 05/31/2011    Lab 06/04/11 1233 06/04/11 1225  CULT       BLOOD CULTURE RECEIVED NO GROWTH TO DATE CULTURE WILL BE HELD FOR 5 DAYS BEFORE ISSUING A FINAL NEGATIVE REPORT       BLOOD CULTURE RECEIVED NO GROWTH TO DATE CULTURE WILL BE HELD  FOR 5 DAYS BEFORE ISSUING A FINAL NEGATIVE REPORT  SDES BLOOD LEFT HAND BLOOD LEFT ARM    Imaging: No results found.   ASSESSMENT:  1. Post-op AF now in NSR 2. AF with RVR now back in NSR 3. Acute HF, likely diastolic, improved 4. COPD with ongoing tobacco use 5. A/C respiratory failure improved 6. 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 Mildly elevated troponin/NSTEMI due to demand ischemia  PLAN/DISCUSSION:  Much improved. Back in NSR. Troponin only mildly elevated. Would treat medically for now with ASA, statin and b-blocker. Suspect she does have underlying CAD and would recommend outpatient Myoview for further risk stratification. If echo show significant LV dysfunction would consider cath vs inpatient Myoview.   Will change lasix to po and give daily dose. Can likely switch back to HCTZ on discharge.   Time spent 25 minutes.    LOS: 9 days    Glori Bickers, MD 06/09/2011, 10:27 AM

## 2011-06-10 DIAGNOSIS — I10 Essential (primary) hypertension: Secondary | ICD-10-CM

## 2011-06-10 DIAGNOSIS — Z48812 Encounter for surgical aftercare following surgery on the circulatory system: Secondary | ICD-10-CM

## 2011-06-10 LAB — CULTURE, BLOOD (ROUTINE X 2)
Culture  Setup Time: 201303042251
Culture  Setup Time: 201303042251
Culture: NO GROWTH
Culture: NO GROWTH

## 2011-06-10 LAB — BASIC METABOLIC PANEL
BUN: 24 mg/dL — ABNORMAL HIGH (ref 6–23)
CO2: 26 mEq/L (ref 19–32)
Calcium: 9.1 mg/dL (ref 8.4–10.5)
Chloride: 101 mEq/L (ref 96–112)
Creatinine, Ser: 1.49 mg/dL — ABNORMAL HIGH (ref 0.50–1.10)
GFR calc Af Amer: 41 mL/min — ABNORMAL LOW (ref >90)
GFR calc non Af Amer: 35 mL/min — ABNORMAL LOW (ref >90)
Glucose, Bld: 96 mg/dL (ref 70–99)
Potassium: 4 mEq/L (ref 3.5–5.1)
Sodium: 136 mEq/L (ref 135–145)

## 2011-06-10 MED ORDER — HYDROCHLOROTHIAZIDE 25 MG PO TABS
25.0000 mg | ORAL_TABLET | Freq: Every day | ORAL | Status: DC
  Administered 2011-06-10 – 2011-06-14 (×4): 25 mg via ORAL
  Filled 2011-06-10 (×5): qty 1

## 2011-06-10 MED ORDER — ASPIRIN 325 MG PO TABS: 81.0000 mg | ORAL_TABLET | Freq: Every day | ORAL | Status: DC

## 2011-06-10 MED ORDER — ASPIRIN EC 81 MG PO TBEC
81.0000 mg | DELAYED_RELEASE_TABLET | Freq: Every day | ORAL | Status: DC
  Administered 2011-06-10 – 2011-06-15 (×5): 81 mg via ORAL
  Filled 2011-06-10 (×6): qty 1

## 2011-06-10 MED ORDER — CARVEDILOL 12.5 MG PO TABS
12.5000 mg | ORAL_TABLET | Freq: Two times a day (BID) | ORAL | Status: DC
  Administered 2011-06-10 – 2011-06-11 (×4): 12.5 mg via ORAL
  Filled 2011-06-10 (×5): qty 1

## 2011-06-10 NOTE — Progress Notes (Addendum)
[facility] Progress Note  06/10/2011 7:53 AM POD 4  Subjective:  No complaints other than she needs some pain medication.  Afebrile x [id number]  99991111 systolic  HR [id number] reg Filed Vitals:   06/10/11 0645  BP: 176/71  Pulse: 96  Temp: 98.2 F (36.8 C)  Resp: 18    Physical Exam: Incisions:  Right groin incision is c/d/i.  Other leg incisions are c/d/i. Right toe amp site still with mild separation in the mid portion of the incision. Extremities:  + right AT doppler signal  CBC    Component Value Date/Time   WBC 26.3* 06/09/2011 0625   RBC 3.25* 06/09/2011 0625   HGB 9.4* 06/09/2011 0625   HCT 27.9* 06/09/2011 0625   PLT 331 06/09/2011 0625   MCV 85.8 06/09/2011 0625   MCH 28.9 06/09/2011 0625   MCHC 33.7 06/09/2011 0625   RDW 17.0* 06/09/2011 0625   LYMPHSABS 1.3 05/31/2011 1100   MONOABS 1.3* 05/31/2011 1100   EOSABS 0.1 05/31/2011 1100   BASOSABS 0.1 05/31/2011 1100    BMET    Component Value Date/Time   NA 138 06/09/2011 0625   K 4.2 06/09/2011 0625   CL 104 06/09/2011 0625   CO2 24 06/09/2011 0625   GLUCOSE 91 06/09/2011 0625   BUN 24* 06/09/2011 0625   CREATININE 1.63* 06/09/2011 0625   CALCIUM 8.8 06/09/2011 0625   GFRNONAA 32* 06/09/2011 0625   GFRAA 37* 06/09/2011 0625    INR    Component Value Date/Time   INR 1.13 06/04/2011 0605     Intake/Output Summary (Last 24 hours) at 06/10/11 0753 Last data filed at 06/10/11 [id number]  Gross per 24 hour  Intake    720 ml  Output   2001 ml  Net  -1281 ml     Assessment/Plan:  66 y.o. female is s/p Right femoral to below knee popliteal bypass with Propaten reversed greater saphenous vein composite  Transmetatarsal amputation of left first toe   POD 4 -ck labs in am -continues to be in NSR -cardiology adjusting BP meds.  Evorn Gong, PA-C [facility] [phone] 06/10/2011 7:53 AM  Addendum  I have independently interviewed and examined the patient, and I agree with the physician assistant's  findings.  No significant changes from yesterday.  Left 1st TMA wound will need to be observed.  Home PT recommended.  Likely home this week.  Adele Barthel, MD [facility] [place] Office: [phone] Pager: [phone]  06/10/2011, 10:15 AM

## 2011-06-10 NOTE — Progress Notes (Signed)
Subjective:   Cardiology consulted 3/8 for post-op AF with RVR and acute CHF in setting of recent toe amputation. Now back in NSR. Breathing much better. No CP. Lying flat.  Trop minimally elevated at 0.45. ECG normal.  Feels good except for some pain in L leg. No CP, SOB, orthopnea or PND. Remains in NSR in 90s. BP is high. Systolics [id number]  Echo reviewed personally EF 60-65% no wall motion abnls.      Intake/Output Summary (Last 24 hours) at 06/10/11 0835 Last data filed at 06/10/11 [id number]  Gross per 24 hour  Intake    720 ml  Output   2001 ml  Net  -1281 ml    Current meds:    . amLODipine  10 mg Oral Daily  . aspirin  325 mg Oral Daily  . atorvastatin  40 mg Oral q1800  . docusate sodium  100 mg Oral Daily  . enoxaparin (LOVENOX) injection  40 mg Subcutaneous Q24H  . famotidine  20 mg Oral Daily  . feeding supplement  237 mL Oral TID WC  . furosemide  20 mg Intravenous Once  . furosemide  40 mg Oral Daily  . metoprolol tartrate  50 mg Oral BID  . potassium chloride  20 mEq Oral Daily  . DISCONTD: enoxaparin (LOVENOX) injection  50 mg Subcutaneous Q24H  . DISCONTD: furosemide  40 mg Intravenous BID  . DISCONTD: metoprolol tartrate  25 mg Oral Q6H   Infusions:     Objective:  Blood pressure 176/71, pulse 96, temperature 98.2 F (36.8 C), temperature source Oral, resp. rate 18, height 5\' 8"  (1.727 m), weight 82.4 kg (181 lb 10.5 oz), SpO2 96.00%. Weight change: -0.155 kg (-5.5 oz)  Physical Exam: General:  Lying flat. No resp difficulty HEENT: normal Neck: supple. JVP 5 . Carotids 2+ bilat; no bruits. No lymphadenopathy or thryomegaly appreciated. Cor: PMI nondisplaced. Regular rate & rhythm. No rubs, gallops or murmurs. Lungs: clear Abdomen: soft, large ventral hernia No hepatosplenomegaly. No bruits or masses. Good bowel sounds. Extremities: no cyanosis, clubbing, rash, tr edema. Boot on L. Surgical scar well healed. Neuro: alert & orientedx3, cranial nerves  grossly intact. moves all 4 extremities w/o difficulty. Affect pleasant  Telemetry:  Sinus tach 90s  Lab Results: Basic Metabolic Panel:  Lab [id number] 06/08/11 1224 06/07/11 0400 06/06/11 1650 06/06/11 1553 06/06/11 1547 06/06/11 1010 06/06/11 0518  NA 138 135 136 140 140 -- -- --  K 4.2 4.6 -- -- -- -- -- --  CL 104 103 106 -- -- 109 -- 105  CO2 24 22 20  -- -- 19 -- 25  GLUCOSE 91 105* 109* -- -- 98 90 --  BUN 24* 23 21 -- -- 21 -- 22  CREATININE 1.63* 1.67* 1.53* -- -- 1.44* -- 1.57*  CALCIUM 8.8 8.5 8.3* -- -- 7.7* -- 9.2  MG -- -- -- -- -- -- -- --  PHOS -- -- -- -- -- -- -- --   Liver Function Tests:  Lab 06/04/11 1218  AST 36  ALT 11  ALKPHOS 96  BILITOT 0.3  PROT 6.7  ALBUMIN 2.6*   No results found for this basename: LIPASE:5,AMYLASE:5 in the last 168 hours No results found for this basename: AMMONIA:5 in the last 168 hours CBC:  Lab 06/09/11 0625 06/07/11 0400 06/06/11 1650 06/06/11 1553 06/06/11 1547 06/06/11 0518 06/05/11 0620  WBC 26.3* 32.3* -- -- 25.4* 12.0* 13.1*  NEUTROABS -- -- -- -- -- -- --  HGB 9.4* 7.4* 9.2* 9.2* 8.6* -- --  HCT 27.9* 22.1* 27.0* 27.0* 25.9* -- --  MCV 85.8 90.2 -- -- 89.3 90.3 88.1  PLT 331 348 -- -- 359 415* 378   Cardiac Enzymes:  Lab 06/08/11 1225 06/06/11 1502  CKTOTAL 854* 343*  CKMB 5.9* 3.3  CKMBINDEX -- --  TROPONINI 0.45* <0.30   BNP: No components found with this basename: POCBNP:5 CBG: No results found for this basename: GLUCAP:5 in the last 168 hours Microbiology: Lab Results  Component Value Date   CULT        BLOOD CULTURE RECEIVED NO GROWTH TO DATE CULTURE WILL BE HELD FOR 5 DAYS BEFORE ISSUING A FINAL NEGATIVE REPORT 06/04/2011   CULT        BLOOD CULTURE RECEIVED NO GROWTH TO DATE CULTURE WILL BE HELD FOR 5 DAYS BEFORE ISSUING A FINAL NEGATIVE REPORT 06/04/2011   CULT NO GROWTH 05/31/2011    Lab 06/04/11 1233 06/04/11 1225  CULT       BLOOD CULTURE RECEIVED NO GROWTH TO DATE CULTURE WILL BE HELD FOR  5 DAYS BEFORE ISSUING A FINAL NEGATIVE REPORT       BLOOD CULTURE RECEIVED NO GROWTH TO DATE CULTURE WILL BE HELD FOR 5 DAYS BEFORE ISSUING A FINAL NEGATIVE REPORT  SDES BLOOD LEFT HAND BLOOD LEFT ARM    Imaging: No results found.   ASSESSMENT:  1. Post-op AF now in NSR 2. AF with RVR now back in NSR 3. Acute HF, likely diastolic, improved 4. COPD with ongoing tobacco use 5. A/C respiratory failure improved 6. 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 Mildly elevated troponin/NSTEMI due to demand ischemia  PLAN/DISCUSSION:  Doing well.  Would treat medically for now with ASA, statin and b-blocker. Suspect she does have underlying CAD and would recommend outpatient Myoview for further risk stratification. Volume status back to baseline so will switch lasix back to HCTZ. For HTN will change lopressor to carvedilol 12.5 bid. If remains up can add low dose ACE-I or ARB. Stable for d/c with close outpatient (Myoview and med titration) from our perspective     LOS: 10 days    Glori Bickers, MD 06/10/2011, 8:35 AM

## 2011-06-10 NOTE — Progress Notes (Signed)
VASCULAR LAB PRELIMINARY  ARTERIAL  ABI completed:    RIGHT    LEFT    PRESSURE WAVEFORM  PRESSURE WAVEFORM  BRACHIAL 177 Triphasic BRACHIAL 182 Triphasic  DP 189 Triphasic DP 44 Severely Dampened Monophasic         PT 155 Monophasic PT  Absent                  RIGHT LEFT  ABI 1.04 0.24   ABIs indicated no change on the right with only a minimal increase on the left. Left ABI indicates a severe reduction in arterial flow at the level of tissue loss.   Shadrach Bartunek D, RVS 06/10/2011, 10:23 AM

## 2011-06-11 LAB — BASIC METABOLIC PANEL
BUN: 21 mg/dL (ref 6–23)
BUN: 24 mg/dL — ABNORMAL HIGH (ref 6–23)
CO2: 24 mEq/L (ref 19–32)
CO2: 24 mEq/L (ref 19–32)
Calcium: 9.2 mg/dL (ref 8.4–10.5)
Calcium: 8.7 mg/dL (ref 8.4–10.5)
Chloride: 98 mEq/L (ref 96–112)
Chloride: 95 mEq/L — ABNORMAL LOW (ref 96–112)
Creatinine, Ser: 1.5 mg/dL — ABNORMAL HIGH (ref 0.50–1.10)
Creatinine, Ser: 1.82 mg/dL — ABNORMAL HIGH (ref 0.50–1.10)
GFR calc Af Amer: 41 mL/min — ABNORMAL LOW (ref >90)
GFR calc Af Amer: 32 mL/min — ABNORMAL LOW (ref >90)
GFR calc non Af Amer: 35 mL/min — ABNORMAL LOW (ref >90)
GFR calc non Af Amer: 28 mL/min — ABNORMAL LOW (ref >90)
Glucose, Bld: 108 mg/dL — ABNORMAL HIGH (ref 70–99)
Glucose, Bld: 148 mg/dL — ABNORMAL HIGH (ref 70–99)
Potassium: 4.1 mEq/L (ref 3.5–5.1)
Potassium: 4.2 mEq/L (ref 3.5–5.1)
Sodium: 135 mEq/L (ref 135–145)
Sodium: 131 mEq/L — ABNORMAL LOW (ref 135–145)

## 2011-06-11 LAB — CBC
HCT: 30.4 % — ABNORMAL LOW (ref 36.0–46.0)
Hemoglobin: 10.1 g/dL — ABNORMAL LOW (ref 12.0–15.0)
MCH: 28.9 pg (ref 26.0–34.0)
MCHC: 33.2 g/dL (ref 30.0–36.0)
MCV: 87.1 fL (ref 78.0–100.0)
Platelets: 420 10*3/uL — ABNORMAL HIGH (ref 150–400)
RBC: 3.49 MIL/uL — ABNORMAL LOW (ref 3.87–5.11)
RDW: 16.6 % — ABNORMAL HIGH (ref 11.5–15.5)
WBC: 17.5 10*3/uL — ABNORMAL HIGH (ref 4.0–10.5)

## 2011-06-11 LAB — GLUCOSE, CAPILLARY: Glucose-Capillary: 110 mg/dL — ABNORMAL HIGH (ref 70–99)

## 2011-06-11 MED ORDER — CARVEDILOL 25 MG PO TABS
25.0000 mg | ORAL_TABLET | Freq: Two times a day (BID) | ORAL | Status: DC
  Administered 2011-06-12: 25 mg via ORAL
  Filled 2011-06-11 (×3): qty 1

## 2011-06-11 MED ORDER — CEFAZOLIN SODIUM-DEXTROSE 2-3 GM-% IV SOLR
2.0000 g | INTRAVENOUS | Status: AC
  Administered 2011-06-12: 2 g via INTRAVENOUS
  Filled 2011-06-11: qty 50

## 2011-06-11 MED ORDER — ALPRAZOLAM 0.5 MG PO TABS: 1.0000 mg | ORAL_TABLET | Freq: Every evening | ORAL | Status: DC | PRN

## 2011-06-11 NOTE — Progress Notes (Signed)
Subjective: No CP or SOB. Objective: Filed Vitals:   06/10/11 1443 06/10/11 2214 06/11/11 0510 06/11/11 0630  BP: 172/78 177/77 174/77 194/91  Pulse: 94 94 93   Temp: 98.4 F (36.9 C) 98.6 F (37 C) 98.5 F (36.9 C)   TempSrc: Oral Oral Oral   Resp: 20 20 18    Height:      Weight:      SpO2: 95% 97% 96%    Weight change:   Intake/Output Summary (Last 24 hours) at 06/11/11 0823 Last data filed at 06/10/11 2215  Gross per 24 hour  Intake    720 ml  Output    603 ml  Net    117 ml    General: Alert, awake, oriented x3, in no acute distress Neck:  JVP is normal Heart: Regular rate and rhythm, without murmurs, rubs, gallops.  Lungs: Clear to auscultation.  No rales or wheezes. Exemities:  No edema.   Neuro: Grossly intact, nonfocal.   Lab Results: Results for orders placed during the hospital encounter of 05/31/11 (from the past 24 hour(s))  GLUCOSE, CAPILLARY     Status: Abnormal   Collection Time   06/11/11  5:51 AM      Component Value Range   Glucose-Capillary 110 (*) 70 - 99 (mg/dL)  CBC     Status: Abnormal   Collection Time   06/11/11  6:50 AM      Component Value Range   WBC 17.5 (*) 4.0 - 10.5 (K/uL)   RBC 3.49 (*) 3.87 - 5.11 (MIL/uL)   Hemoglobin 10.1 (*) 12.0 - 15.0 (g/dL)   HCT 30.4 (*) 36.0 - 46.0 (%)   MCV 87.1  78.0 - 100.0 (fL)   MCH 28.9  26.0 - 34.0 (pg)   MCHC 33.2  30.0 - 36.0 (g/dL)   RDW 16.6 (*) 11.5 - 15.5 (%)   Platelets 420 (*) 150 - 400 (K/uL)    Studies/Results: No results found.  Medications: I have reviewed the patient's current medications.   Patient Active Hospital Problem List: Dry gangrene (05/31/2011)     Essential hypertension, benign (12/10/2007)   Assessment: Will increase coreg to 25 bid. Keep on amlodipine 10.  Check BMET.  Reluctant to add ACE I until renal func stabilizes.    Chronic kidney disease (CKD), stage III (moderate) (12/22/2007)   Assessment: Repeat BMET in AM   Plan:   Leukocytosis (05/31/2011)  Assessment:   Acute on chronic renal failure (06/02/2011)   Assessment: Repeat BMET in AM   PAD (peripheral artery disease) (06/02/2011)   Assessment:     Atrial fibrillation (06/08/2011)   Assessment: Remains in SR>    NSTEMI (non-ST elevated myocardial infarction) (06/09/2011)   Assessment: Probably demand related.  Will make sure she is set up for outpt lexiscan myoview    Acute diastolic heart failure (99991111)   Assessment: Asymptomatic now that in SR.   Work on BP   LOS: 11 days   Dorris Carnes 06/11/2011, 8:23 AM

## 2011-06-11 NOTE — Progress Notes (Addendum)
[facility]  Progress Note Bypass Surgery  Date of Surgery: 05/31/2011 - 06/06/2011  Procedure(s): Left BYPASS GRAFT FEMORAL-POPLITEAL ARTERY AMPUTATION DIGIT Surgeon: Surgeon(s): Elam Dutch, MD  5 Days Post-Op  History of Present Illness  Stephanie Griffith is a 66 y.o. female who C/O pain in left foot and ankle. Still requiring morphine on occasion.   BP still high ST overnight - HR 93 this am  Significant Diagnostic Studies: CBC Lab Results  Component Value Date   WBC 26.3* 06/09/2011   HGB 9.4* 06/09/2011   HCT 27.9* 06/09/2011   MCV 85.8 06/09/2011   PLT 331 06/09/2011    BMET      Component Value Date/Time   NA 136 06/10/2011 0710   K 4.0 06/10/2011 0710   CL 101 06/10/2011 0710   CO2 26 06/10/2011 0710   GLUCOSE 96 06/10/2011 0710   BUN 24* 06/10/2011 0710   CREATININE 1.49* 06/10/2011 0710   CALCIUM 9.1 06/10/2011 0710   GFRNONAA 35* 06/10/2011 0710   GFRAA 41* 06/10/2011 0710    COAG Lab Results  Component Value Date   INR 1.13 06/04/2011   No results found for this basename: PTT    Physical Examination  BP Readings from Last 3 Encounters:  06/11/11 194/91  06/11/11 194/91  06/11/11 194/91   Temp Readings from Last 3 Encounters:  06/11/11 98.5 F (36.9 C) Oral  06/11/11 98.5 F (36.9 C) Oral  06/11/11 98.5 F (36.9 C) Oral   SpO2 Readings from Last 3 Encounters:  06/11/11 96%  06/11/11 96%  06/11/11 96%   Pulse Readings from Last 3 Encounters:  06/11/11 93  06/11/11 93  06/11/11 93    Pt is A&O x 3 left lower extremity: Incision/s is/are clean,dry.intact, and  healing without hematoma, erythema or drainage Limb is warm; with good color  Assessment: Pt. Doing well Post-op pain is poorly controlled Wounds are healing well WBC - high _ urine culture neg. Pre-op even with leukocytosis and many bact on U/A 2/28 - pt afebrile   Plan: PT/OT for ambulation Continue wound care as ordered  Stephanie Griffith [id number] 06/11/2011 7:54 AM  Details as above needs work up for leukocytosis.  Incisions don't seem to be source  Will check urine If negative will repeat chest xray No doppler signals in left foot, blistering at ankle level Foot cool dusky  Bypass is occluded will need BKA vs AKA.  Risks benefits d/w pt and daughters especially chance that BKA may not heal.  She had very poor runoff and a composite bypass.  I do not believe emergent thrombectomy of the bypass is warranted.  NPO p midnight Consent  [name], MD [facility] Office: [phone] Pager: [phone]

## 2011-06-11 NOTE — Progress Notes (Signed)
Utilization review completed. Rozanna Boer, RN, BSN. 06/11/11

## 2011-06-11 NOTE — Progress Notes (Signed)
Physical Therapy Treatment Patient Details Name: Stephanie Griffith MRN: [id number] DOB: Nov 05, 1945 Today's Date: 06/11/2011  PT Assessment/Plan  PT - Assessment/Plan Comments on Treatment Session: Pt s/p fempop and toe amp LLE. Pt continues to progress slowly and do not feel at this time pt can discharge to dgtr's house. Discussed with dgtr and pt regarding discharge. Encouraged increased ambulation even 5' to [facility] as needed and increased HEP dgtr and pt verbalized understanding and RN aware. Will continue to follow. PT Plan: Discharge plan needs to be updated;Frequency remains appropriate Follow Up Recommendations: Skilled nursing facility PT Goals  Acute Rehab PT Goals Pt will go Supine/Side to Sit: with modified independence;with HOB 0 degrees PT Goal: Supine/Side to Sit - Progress: Updated due to goal met PT Goal: Sit to Supine/Side - Progress: Progressing toward goal PT Goal: Sit to Stand - Progress: Progressing toward goal Pt will go Stand to Sit: with modified independence PT Goal: Stand to Sit - Progress: Updated due to goals met PT Transfer Goal: Bed to Chair/Chair to Bed - Progress: Progressing toward goal PT Goal: Ambulate - Progress: Progressing toward goal PT Goal: Up/Down Stairs - Progress: Progressing toward goal PT Goal: Perform Home Exercise Program - Progress: Progressing toward goal  PT Treatment Precautions/Restrictions  Precautions Precautions: Fall Required Braces or Orthoses: Yes Other Brace/Splint: Darco Restrictions Weight Bearing Restrictions: Yes Other Position/Activity Restrictions: Heel only LLE Mobility (including Balance) Bed Mobility Supine to Sit: With rails;HOB elevated (Comment degrees);6: Modified independent (Device/Increase time) (HOB 10degrees) Supine to Sit Details (indicate cue type and reason): increased time Sitting - Scoot to Edge of Bed: 6: Modified independent (Device/Increase time) Sitting - Scoot to Edge of Bed Details (indicate cue type and  reason): increased time Transfers Sit to Stand: 4: Min assist;From bed Sit to Stand Details (indicate cue type and reason): cues for hand placement, RLE position and sequence Stand to Sit: 5: Supervision Stand to Sit Details: cues for hand placement, safety and control of descent Ambulation/Gait Ambulation/Gait: Yes Ambulation/Gait Assistance: 4: Min assist Ambulation/Gait Assistance Details (indicate cue type and reason): cues for sequence, pt maintaining trunk flexion and elbow flexion despite max cueing for upright trunk and bilUE use to assist with stepping Ambulation Distance (Feet): 16 Feet Assistive device: Rolling walker Gait Pattern: Step-to pattern;Decreased step length - right;Decreased step length - left;Trunk flexed Gait velocity: slowly Stairs: No  Posture/Postural Control Posture/Postural Control: No significant limitations Exercise  General Exercises - Lower Extremity Long Arc Quad: AROM;Left;15 reps;Seated Hip ABduction/ADduction: AROM;Left;15 reps;Seated Hip Flexion/Marching: AROM;Left;Other reps (comment);Seated (15reps) Other Exercises Other Exercises: armrest push ups bilUE x 5 A/ROM End of Session PT - End of Session Equipment Utilized During Treatment: Gait belt Activity Tolerance: Patient limited by fatigue Patient left: in chair;with call bell in reach;with family/visitor present Nurse Communication: Mobility status for transfers;Mobility status for ambulation General Behavior During Session: Flat affect Cognition: WFL for tasks performed  Melford Aase 06/11/2011, 9:50 AM Lanetta Inch, [place]

## 2011-06-12 ENCOUNTER — Encounter (HOSPITAL_COMMUNITY)
Admission: EM | Disposition: A | Discharge status: Other Acute Inpatient Hospital | Payer: Self-pay | Source: Home / Self Care | Attending: Vascular Surgery

## 2011-06-12 ENCOUNTER — Encounter (HOSPITAL_COMMUNITY): Payer: Self-pay | Admitting: Anesthesiology

## 2011-06-12 ENCOUNTER — Inpatient Hospital Stay (HOSPITAL_COMMUNITY): Payer: Medicare Other | Admitting: Anesthesiology

## 2011-06-12 DIAGNOSIS — I70269 Atherosclerosis of native arteries of extremities with gangrene, unspecified extremity: Secondary | ICD-10-CM

## 2011-06-12 HISTORY — PX: AMPUTATION: SHX166

## 2011-06-12 LAB — BASIC METABOLIC PANEL
BUN: 27 mg/dL — ABNORMAL HIGH (ref 6–23)
CO2: 25 mEq/L (ref 19–32)
Calcium: 8.9 mg/dL (ref 8.4–10.5)
Chloride: 96 mEq/L (ref 96–112)
Creatinine, Ser: 1.77 mg/dL — ABNORMAL HIGH (ref 0.50–1.10)
GFR calc Af Amer: 33 mL/min — ABNORMAL LOW (ref >90)
GFR calc non Af Amer: 29 mL/min — ABNORMAL LOW (ref >90)
Glucose, Bld: 113 mg/dL — ABNORMAL HIGH (ref 70–99)
Potassium: 4.2 mEq/L (ref 3.5–5.1)
Sodium: 131 mEq/L — ABNORMAL LOW (ref 135–145)

## 2011-06-12 LAB — CBC
HCT: 28 % — ABNORMAL LOW (ref 36.0–46.0)
Hemoglobin: 9.3 g/dL — ABNORMAL LOW (ref 12.0–15.0)
MCH: 28.7 pg (ref 26.0–34.0)
MCHC: 33.2 g/dL (ref 30.0–36.0)
MCV: 86.4 fL (ref 78.0–100.0)
Platelets: 391 10*3/uL (ref 150–400)
RBC: 3.24 MIL/uL — ABNORMAL LOW (ref 3.87–5.11)
RDW: 16.7 % — ABNORMAL HIGH (ref 11.5–15.5)
WBC: 15.6 10*3/uL — ABNORMAL HIGH (ref 4.0–10.5)

## 2011-06-12 LAB — GLUCOSE, CAPILLARY: Glucose-Capillary: 105 mg/dL — ABNORMAL HIGH (ref 70–99)

## 2011-06-12 SURGERY — AMPUTATION, ABOVE KNEE
Anesthesia: General | Site: Leg Upper | Laterality: Left | Wound class: Clean

## 2011-06-12 MED ORDER — CARVEDILOL 25 MG PO TABS
25.0000 mg | ORAL_TABLET | Freq: Two times a day (BID) | ORAL | Status: DC
  Administered 2011-06-12 – 2011-06-15 (×6): 25 mg via ORAL
  Filled 2011-06-12 (×8): qty 1

## 2011-06-12 MED ORDER — ALUM & MAG HYDROXIDE-SIMETH 200-200-20 MG/5ML PO SUSP
15.0000 mL | ORAL | Status: DC | PRN
  Administered 2011-06-12 – 2011-06-14 (×3): 30 mL via ORAL
  Filled 2011-06-12 (×3): qty 30

## 2011-06-12 MED ORDER — PHENYLEPHRINE HCL 10 MG/ML IJ SOLN
INTRAMUSCULAR | Status: DC | PRN
  Administered 2011-06-12 (×2): 40 ug via INTRAVENOUS

## 2011-06-12 MED ORDER — METOPROLOL TARTRATE 1 MG/ML IV SOLN: 2.0000 mg | INTRAVENOUS | Status: DC | PRN

## 2011-06-12 MED ORDER — SODIUM CHLORIDE 0.9 % IV SOLN
INTRAVENOUS | Status: DC | PRN
  Administered 2011-06-12 (×3): via INTRAVENOUS

## 2011-06-12 MED ORDER — CEFAZOLIN SODIUM 1-5 GM-% IV SOLN
INTRAVENOUS | Status: AC
  Filled 2011-06-12: qty 100

## 2011-06-12 MED ORDER — GLYCOPYRROLATE 0.2 MG/ML IJ SOLN
INTRAMUSCULAR | Status: DC | PRN
  Administered 2011-06-12: .6 mg via INTRAVENOUS

## 2011-06-12 MED ORDER — ENOXAPARIN SODIUM 40 MG/0.4ML ~~LOC~~ SOLN
40.0000 mg | SUBCUTANEOUS | Status: DC
  Administered 2011-06-13 – 2011-06-15 (×3): 40 mg via SUBCUTANEOUS
  Filled 2011-06-12 (×3): qty 0.4

## 2011-06-12 MED ORDER — FAMOTIDINE IN NACL 20-0.9 MG/50ML-% IV SOLN: 20.0000 mg | Freq: Two times a day (BID) | INTRAVENOUS | Status: DC

## 2011-06-12 MED ORDER — DEXTROSE 5 % IV SOLN
1.5000 g | Freq: Two times a day (BID) | INTRAVENOUS | Status: AC
  Administered 2011-06-12 – 2011-06-13 (×2): 1.5 g via INTRAVENOUS
  Filled 2011-06-12 (×2): qty 1.5

## 2011-06-12 MED ORDER — LIDOCAINE HCL (CARDIAC) 20 MG/ML IV SOLN
INTRAVENOUS | Status: DC | PRN
  Administered 2011-06-12: 60 mg via INTRAVENOUS

## 2011-06-12 MED ORDER — 0.9 % SODIUM CHLORIDE (POUR BTL) OPTIME
TOPICAL | Status: DC | PRN
  Administered 2011-06-12: 1000 mL

## 2011-06-12 MED ORDER — ACETAMINOPHEN 650 MG RE SUPP: 325.0000 mg | RECTAL | Status: DC | PRN

## 2011-06-12 MED ORDER — HYDROMORPHONE HCL PF 1 MG/ML IJ SOLN: 0.2500 mg | INTRAMUSCULAR | Status: DC | PRN

## 2011-06-12 MED ORDER — ROCURONIUM BROMIDE 100 MG/10ML IV SOLN
INTRAVENOUS | Status: DC | PRN
  Administered 2011-06-12: 40 mg via INTRAVENOUS

## 2011-06-12 MED ORDER — NEOSTIGMINE METHYLSULFATE 1 MG/ML IJ SOLN
INTRAMUSCULAR | Status: DC | PRN
  Administered 2011-06-12: 4 mg via INTRAVENOUS

## 2011-06-12 MED ORDER — ACETAMINOPHEN 325 MG PO TABS: 325.0000 mg | ORAL_TABLET | ORAL | Status: DC | PRN

## 2011-06-12 MED ORDER — SODIUM CHLORIDE 0.9 % IV SOLN
INTRAVENOUS | Status: DC
  Administered 2011-06-12: 10:00:00 via INTRAVENOUS

## 2011-06-12 MED ORDER — POTASSIUM CHLORIDE CRYS ER 20 MEQ PO TBCR: 20.0000 meq | EXTENDED_RELEASE_TABLET | Freq: Once | ORAL | Status: AC | PRN

## 2011-06-12 MED ORDER — ONDANSETRON HCL 4 MG/2ML IJ SOLN
INTRAMUSCULAR | Status: DC | PRN
  Administered 2011-06-12: 4 mg via INTRAVENOUS

## 2011-06-12 MED ORDER — PROPOFOL 10 MG/ML IV EMUL
INTRAVENOUS | Status: DC | PRN
  Administered 2011-06-12: 130 mg via INTRAVENOUS

## 2011-06-12 MED ORDER — MAGNESIUM SULFATE 40 MG/ML IJ SOLN: 2.0000 g | Freq: Once | INTRAMUSCULAR | Status: DC | PRN

## 2011-06-12 MED ORDER — SUFENTANIL CITRATE 50 MCG/ML IV SOLN
INTRAVENOUS | Status: DC | PRN
  Administered 2011-06-12: 20 ug via INTRAVENOUS
  Administered 2011-06-12: 10 ug via INTRAVENOUS

## 2011-06-12 SURGICAL SUPPLY — 57 items
BANDAGE ELASTIC 6 VELCRO ST LF (GAUZE/BANDAGES/DRESSINGS) ×3 IMPLANT
BANDAGE ESMARK 6X9 LF (GAUZE/BANDAGES/DRESSINGS) ×1 IMPLANT
BANDAGE GAUZE ELAST BULKY 4 IN (GAUZE/BANDAGES/DRESSINGS) ×6 IMPLANT
BLADE SAW RECIP 87.9 MT (BLADE) ×3 IMPLANT
BNDG CMPR 9X6 STRL LF SNTH (GAUZE/BANDAGES/DRESSINGS) ×2
BNDG COHESIVE 6X5 TAN STRL LF (GAUZE/BANDAGES/DRESSINGS) ×3 IMPLANT
BNDG ESMARK 6X9 LF (GAUZE/BANDAGES/DRESSINGS) ×3
CANISTER SUCTION 2500CC (MISCELLANEOUS) ×3 IMPLANT
CLIP TI MEDIUM 6 (CLIP) IMPLANT
CLOTH BEACON ORANGE TIMEOUT ST (SAFETY) ×3 IMPLANT
COVER SURGICAL LIGHT HANDLE (MISCELLANEOUS) ×6 IMPLANT
COVER TABLE BACK 60X90 (DRAPES) ×2 IMPLANT
CUFF TOURNIQUET SINGLE 18IN (TOURNIQUET CUFF) IMPLANT
CUFF TOURNIQUET SINGLE 24IN (TOURNIQUET CUFF) IMPLANT
CUFF TOURNIQUET SINGLE 34IN LL (TOURNIQUET CUFF) IMPLANT
CUFF TOURNIQUET SINGLE 44IN (TOURNIQUET CUFF) IMPLANT
DRAIN CHANNEL 19F RND (DRAIN) IMPLANT
DRAPE ORTHO SPLIT 77X108 STRL (DRAPES) ×6
DRAPE PROXIMA HALF (DRAPES) ×7 IMPLANT
DRAPE SURG ORHT 6 SPLT 77X108 (DRAPES) ×3 IMPLANT
DRSG ADAPTIC 3X8 NADH LF (GAUZE/BANDAGES/DRESSINGS) ×3 IMPLANT
ELECT REM PT RETURN 9FT ADLT (ELECTROSURGICAL) ×3
ELECTRODE REM PT RTRN 9FT ADLT (ELECTROSURGICAL) ×2 IMPLANT
EVACUATOR SILICONE 100CC (DRAIN) IMPLANT
GAUZE KERLIX 2  STERILE LF (GAUZE/BANDAGES/DRESSINGS) ×2 IMPLANT
GAUZE SPONGE 4X4 12PLY STRL LF (GAUZE/BANDAGES/DRESSINGS) ×2 IMPLANT
GLOVE BIO SURGEON STRL SZ 6.5 (GLOVE) ×4 IMPLANT
GLOVE BIO SURGEON STRL SZ7.5 (GLOVE) ×3 IMPLANT
GLOVE BIOGEL PI IND STRL 7.0 (GLOVE) ×3 IMPLANT
GLOVE BIOGEL PI INDICATOR 7.0 (GLOVE) ×3
GLOVE ECLIPSE 7.0 STRL STRAW (GLOVE) ×4 IMPLANT
GLOVE SS BIOGEL STRL SZ 7 (GLOVE) ×1 IMPLANT
GLOVE SUPERSENSE BIOGEL SZ 7 (GLOVE) ×1
GOWN PREVENTION PLUS XLARGE (GOWN DISPOSABLE) ×5 IMPLANT
GOWN STRL NON-REIN LRG LVL3 (GOWN DISPOSABLE) ×8 IMPLANT
KIT BASIN OR (CUSTOM PROCEDURE TRAY) ×3 IMPLANT
KIT ROOM TURNOVER OR (KITS) ×3 IMPLANT
NS IRRIG 1000ML POUR BTL (IV SOLUTION) ×3 IMPLANT
PACK GENERAL/GYN (CUSTOM PROCEDURE TRAY) ×3 IMPLANT
PAD ARMBOARD 7.5X6 YLW CONV (MISCELLANEOUS) ×6 IMPLANT
PADDING CAST COTTON 6X4 STRL (CAST SUPPLIES) IMPLANT
SPONGE GAUZE 4X4 12PLY (GAUZE/BANDAGES/DRESSINGS) ×6 IMPLANT
STAPLER VISISTAT 35W (STAPLE) ×3 IMPLANT
STOCKINETTE IMPERVIOUS LG (DRAPES) ×3 IMPLANT
SUT ETHILON 3 0 PS 1 (SUTURE) IMPLANT
SUT SILK 2 0 (SUTURE) ×3
SUT SILK 2 0 SH CR/8 (SUTURE) ×6 IMPLANT
SUT SILK 2-0 18XBRD TIE 12 (SUTURE) ×2 IMPLANT
SUT VIC AB 2-0 CT1 27 (SUTURE) ×6
SUT VIC AB 2-0 CT1 TAPERPNT 27 (SUTURE) ×4 IMPLANT
SUT VIC AB 2-0 SH 18 (SUTURE) ×7 IMPLANT
SUT VIC AB 3-0 SH 27 (SUTURE) ×6
SUT VIC AB 3-0 SH 27X BRD (SUTURE) ×4 IMPLANT
TOWEL OR 17X24 6PK STRL BLUE (TOWEL DISPOSABLE) ×3 IMPLANT
TOWEL OR 17X26 10 PK STRL BLUE (TOWEL DISPOSABLE) ×3 IMPLANT
UNDERPAD 30X30 INCONTINENT (UNDERPADS AND DIAPERS) ×3 IMPLANT
WATER STERILE IRR 1000ML POUR (IV SOLUTION) ×3 IMPLANT

## 2011-06-12 NOTE — Transfer of Care (Signed)
Immediate Anesthesia Transfer of Care Note  Patient: Stephanie Griffith  Procedure(s) Performed: Procedure(s) (LRB): AMPUTATION ABOVE KNEE (Left)  Patient Location: PACU  Anesthesia Type: General  Level of Consciousness: awake, alert  and patient cooperative  Airway & Oxygen Therapy: Patient Spontanous Breathing, Patient connected to face mask oxygen and Pt removed O2 mask; SPO2 on room air:  96%  Post-op Assessment: Report given to PACU RN and Post -op Vital signs reviewed and stable  Post vital signs: Reviewed and stable  Complications: No apparent anesthesia complications

## 2011-06-12 NOTE — Progress Notes (Signed)
Patient Name: Stephanie Griffith Date of Encounter: 06/12/2011     Principal Problem:  *Dry gangrene Active Problems:  ANEMIA-NOS  Essential hypertension, benign  Chronic kidney disease (CKD), stage III (moderate)  Abdominal pain  Leukocytosis  Hyponatremia  Hyperglycemia  Acute on chronic renal failure  PAD (peripheral artery disease)  Cholelithiasis and cholecystitis without obstruction  Ventral hernia  Atrial fibrillation  NSTEMI (non-ST elevated myocardial infarction)  Acute diastolic heart failure    SUBJECTIVE: Patient without complaints this morning. Specifically denies cp, sob, lightheadedness, palpitations, diaphoresis or n/v.   OBJECTIVE  Filed Vitals:   06/11/11 1400 06/11/11 1635 06/11/11 1954 06/12/11 0352  BP: 144/75 148/78 131/79 137/76  Pulse: 87  85 98  Temp: 98 F (36.7 C)  98.8 F (37.1 C) 98.2 F (36.8 C)  TempSrc: Oral  Oral Oral  Resp: 18  18 18   Height:      Weight:      SpO2: 95%  96% 96%    Intake/Output Summary (Last 24 hours) at 06/12/11 0820 Last data filed at 06/11/11 1700  Gross per 24 hour  Intake    360 ml  Output    476 ml  Net   -116 ml   Weight change:   PHYSICAL EXAM  General: Well developed, well nourished, in no acute distress. Head: Normocephalic, atraumatic, sclera non-icteric, no xanthomas.  Neck: Supple without bruits or JVD. Lungs:  Resp regular and unlabored, CTAB without wheezes, rales or rhonchi Heart: RRR no s3, s4, or murmurs, rubs, heaves, gallops or thrills Abdomen: Soft, non-tender, non-distended, BS + x 4.  Msk:  Strength and tone appears normal for age. Extremities: LLE- pallor, cool to touch, vesicular lesions noted around ankle. No clubbing, cyanosis or edema. DP/PT/Radials 2+ on R, absent on L  Neuro: Alert and oriented X 3. Moves all extremities spontaneously. Psych: Normal affect.  LABS:  Recent Labs  Basename 06/12/11 0500 06/11/11 0650   WBC 15.6* 17.5*   HGB 9.3* 10.1*   HCT 28.0* 30.4*   MCV 86.4 87.1   PLT 391 420*    Lab 06/12/11 0500 06/11/11 1855 06/11/11 0650  NA 131* 131* 135  K 4.2 4.2 4.1  CL 96 95* 98  CO2 25 24 24   BUN 27* 24* 21  CREATININE 1.77* 1.82* 1.50*  CALCIUM 8.9 8.7 9.2  PROT -- -- --  BILITOT -- -- --  ALKPHOS -- -- --  ALT -- -- --  AST -- -- --  AMYLASE -- -- --  LIPASE -- -- --  GLUCOSE 113* 148* 108*    TELE: NSR, 80-90 bpm, trace PVCs  ECG: no new tracings this AM  Radiology/Studies:  Ct Abdomen Pelvis Wo Contrast  05/31/2011  *RADIOLOGY REPORT*  Clinical Data: Pain with nausea and vomiting.  CT ABDOMEN AND PELVIS WITHOUT CONTRAST  Technique:  Multidetector CT imaging of the abdomen and pelvis was performed following the standard protocol without intravenous contrast.  Comparison: 08/30/2008  Findings: There are multiple gallstones.  Gallbladder wall is not thickened.  No dilated bile ducts.  Liver, spleen, pancreas, and kidneys are normal.  Bilateral benign adrenal hyperplasia, stable.  Midline abdominal hernia containing a portion of the transverse colon without evidence of obstruction and without change.  The numerous diverticula in the distal descending and sigmoid portion of the colon without diverticulitis.  Terminal ileum and appendix are normal.  Uterus and ovaries are normal.  Small amount of nonspecific free fluid in the pelvic cul-de-sac.  No  significant osseous abnormality.  IMPRESSION: No acute abnormalities of the abdomen or pelvis.  Cholelithiasis. Benign adrenal hyperplasia.  Anterior abdominal wall hernia containing colon.  Diverticulosis.  Original Report Authenticated By: Larey Seat, M.D.   Dg Chest 2 View  06/02/2011  *RADIOLOGY REPORT*  Clinical Data: Preop hernia repair  CHEST - 2 VIEW  Comparison: Chest radiograph 12/21/2007  Findings: Normal cardiac silhouette with the ectatic aorta.  Lungs are clear.  No effusion, infiltrate, pneumothorax. Degenerative osteophytosis of the thoracic spine.  IMPRESSION: No acute  cardiopulmonary process.  Original Report Authenticated By: Suzy Bouchard, M.D.   Dg Femur Left  06/06/2011  *RADIOLOGY REPORT*  Clinical Data: Incorrect needle count.  LEFT FEMUR - 2 VIEW  Comparison: None.  Findings: Portable supine view of the left hip obtained at 1255 hours shows no unexpected or suspicious radiopaque foreign body. There is some gas within the soft tissues of the left groin region. Skin staples overlie the medial aspect of the proximal left thigh.  IMPRESSION: No evidence for a suspicious or unexpected retained radiopaque foreign body.  Original Report Authenticated By: ERIC A. MANSELL, M.D.   Dg Tibia/fibula Left  06/06/2011  *RADIOLOGY REPORT*  Clinical Data: Incorrect needle count  LEFT TIBIA AND FIBULA - 2 VIEW  Comparison: None.  Findings: 1255 hours.  A single portable view of the left proximal tibia / fibula is obtained.  Skin staples are seen medially.  There are some surgical clips overlying the tibial metaphysis and just medial to the medial tibial plateau.  Gas in the soft tissues is compatible with the immediate postoperative state.  No unexpected or suspicious radiopaque foreign body.  Specifically, no findings to suggest the presence of a retained needle.  IMPRESSION: No unexpected or suspicious or radiopaque foreign body.  Original Report Authenticated By: ERIC A. MANSELL, M.D.   US Abdomen Complete  06/02/2011  *RADIOLOGY REPORT*  Clinical Data:  Upper abdominal pain, nausea, gallstones  COMPLETE ABDOMINAL ULTRASOUND  Comparison:  CT 05/31/2011  Findings:  Gallbladder:  There are several gallstones in the gallbladder neck ranging in size from 6-10 mm.  There approximately 6 to 8 stones. The gallbladder wall is thickened to 6 mm.  There is no pericholecystic fluid.  Negative sonographic Murphy's sign.  Common bile duct:  Upper limits of normal at 6 mm  Liver:  No focal lesion identified.  Within normal limits in parenchymal echogenicity.  IVC:  Appears normal.  Pancreas:   No focal abnormality seen.  Spleen:  Normal in size and echogenicity  Right Kidney:  9.7cm in length.  No evidence of hydronephrosis or stones.  Left Kidney:  8.8cm in length.  No evidence of hydronephrosis or stones.  Abdominal aorta:  No aneurysm identified.  IMPRESSION:  Gallstones with gallbladder wall thickening are signs concerning for acute cholecystitis.  Negative sonographic Murphy's sign and no pericholecystic fluid weigh against cholecystitis.  Recommend clinical correlation for acute cholecystitis and consider nuclear medicine HIDA scan for increased specificity if clinical pictures is equivocal.  Original Report Authenticated By: Suzy Bouchard, M.D.   Dg Chest Port 1 View  06/07/2011  *RADIOLOGY REPORT*  Clinical Data: Respiratory distress after fem-pop bypass.  PORTABLE CHEST - 1 VIEW  Comparison: Chest x-ray 06/06/2011.  Findings: There continue to be areas of airspace consolidation throughout the right mid and lower lung, concerning for pneumonia or severe aspiration pneumonitis.  Left lung appears relatively clear.  Mild blunting of the right costophrenic sulcus is favored to be technique related (  as the film is slightly under penetrated), however, a small right-sided pleural effusion is difficult to exclude.  Heart size is upper limits of normal. The patient is rotated to the left on today's exam, resulting in distortion of the mediastinal contours and reduced diagnostic sensitivity and specificity for mediastinal pathology.  IMPRESSION: 1.  Persistent to extensive air space consolidation throughout the right mid and lower lung concerning for aspiration pneumonia/pneumonitis. 2.  Possible small right-sided pleural effusion.  Original Report Authenticated By: Etheleen Mayhew, M.D.   Dg Chest Port 1 View  06/06/2011  *RADIOLOGY REPORT*  Clinical Data: Postop fem-pop bypass procedure.  Patient in respiratory distress.  PORTABLE CHEST - 1 VIEW  Comparison: Chest x-ray 06/02/2011.  Findings:  The heart is borderline enlarged.  There are small bilateral pleural effusions.  Asymmetric airspace process in the right lung suspicious for aspiration.  Asymmetric edema is possible but unlikely.  IMPRESSION: Asymmetric airspace process in the right lung likely due to aspiration and less likely asymmetric edema.  Original Report Authenticated By: P. Kalman Jewels, M.D.   Dg Foot Complete Left  05/31/2011  *RADIOLOGY REPORT*  Clinical Data: Wound at left great toe  LEFT FOOT - COMPLETE 3+ VIEW  Comparison: None  Findings: Osseous demineralization. Joint spaces preserved. Soft tissue irregularity and distal phalanx. Question soft tissue gas at medial aspect of distal phalanx great toe. No definite acute fracture, dislocation or bone destruction.  IMPRESSION: No acute osseous abnormalities. Diffuse osseous demineralization. Question soft tissue gas at medial aspect of distal phalanx.  Original Report Authenticated By: Burnetta Sabin, M.D.    Current Medications:     . amLODipine  10 mg Oral Daily  . aspirin EC  81 mg Oral Daily  . atorvastatin  40 mg Oral q1800  . carvedilol  25 mg Oral BID WC  .  ceFAZolin (ANCEF) IV  2 g Intravenous On Call  . docusate sodium  100 mg Oral Daily  . enoxaparin (LOVENOX) injection  40 mg Subcutaneous Q24H  . famotidine  20 mg Oral Daily  . feeding supplement  237 mL Oral TID WC  . hydrochlorothiazide  25 mg Oral Daily  . potassium chloride  20 mEq Oral Daily  . 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  1. Dry gangrene- noted above. Fem-pop bypass found to be occluded yesterday evidenced by lack of L foot doppler signals. Scheduled to undergo BKA vs AKA today per  vascular surgery.   2. Paroxysmal atrial fibrillation- + RVR in the post-op setting. Pt maintaining SR this AM. She also had an episode of hemoptysis post-op with anemia, and continued on Lovenox dosing for DVT prophylaxis.   - Will continue to monitor post-op.  3. HTN- well-controlled this AM after up-titrating Coreg.  - Continue Coreg, HCTZ  4. Acute diastolic CHF- she denies dyspnea, orthopnea, cough or LE edema this AM. Euvolemic on exam. LVEF 60-65%, mild LVH, no WMAs, grade 1 diastolic dysfunction on echo 03/09; pulmonary PA pressures 58 mmHg.   - Will continue to monitor volume status and need for diuresis post-op.   5. Acute on CKD- Cr 1.77 this AM, improved from yesterday; however, steadily declining. She was switched from Lasix to HCTZ  on 03/10.   - Will need to be monitored should she require further diuresis post-op.   6. Anemia- post-op. H/H down at 9.3/28.0 today; however, still improved from prior H/H levels earlier this admission. Patient denies active bleeding.   7. Leukocytosis- continues to trend down  Signed, R. Valeria Batman, PA-C 06/12/2011, 8:20 AM  Patient seen with PA, agree with note.  Patient is going for BKA versus AKA today.  Stable from cardiac standpoint.   Loralie Champagne 06/12/2011 1:23 PM

## 2011-06-12 NOTE — Progress Notes (Signed)
CSW received a conuslt for SNF. CSW consulted with unit care coordinator. Pt had an AKA today. Awaiting PT evals for discharge recommendations. CSW will assess for dispo. CSW will continue to follow. Please call if an urgent need arises.   Darden Dates, MSW, [place]

## 2011-06-12 NOTE — Anesthesia Postprocedure Evaluation (Signed)
  Anesthesia Post-op Note  Patient: Stephanie Griffith  Procedure(s) Performed: Procedure(s) (LRB): AMPUTATION ABOVE KNEE (Left)  Patient Location: PACU  Anesthesia Type: General  Level of Consciousness: awake and alert   Airway and Oxygen Therapy: Patient Spontanous Breathing  Post-op Pain: mild  Post-op Assessment: Post-op Vital signs reviewed, Patient's Cardiovascular Status Stable, Respiratory Function Stable and Patent Airway  Post-op Vital Signs: Reviewed and stable  Complications: No apparent anesthesia complications

## 2011-06-12 NOTE — Preoperative (Addendum)
Beta Blockers   Reason not to administer Beta Blockers:probable non-compliance, not used for about 2 months.Marland KitchenMarland KitchenDid get 25 mg Carvedilol at 630 this morning.Marland KitchenMarland Kitchen

## 2011-06-12 NOTE — Anesthesia Preprocedure Evaluation (Addendum)
Anesthesia Evaluation  Patient identified by MRN, date of birth, ID band Patient awake    Reviewed: Allergy & Precautions, H&P , NPO status , Patient's Chart, lab work & pertinent test results, reviewed documented beta blocker date and time   History of Anesthesia Complications (+) MALIGNANT HYPERTHERMIA  Airway Mallampati: III TM Distance: >3 FB Neck ROM: Full    Dental No notable dental hx. (+) Missing, Dental Advisory Given and Chipped   Pulmonary neg pulmonary ROS,  breath sounds clear to auscultation  Pulmonary exam normal       Cardiovascular hypertension, On Medications and Pt. on home beta blockers + Past MI and + Peripheral Vascular Disease + dysrhythmias Atrial Fibrillation Rhythm:Regular Rate:Normal     Neuro/Psych negative neurological ROS  negative psych ROS   GI/Hepatic negative GI ROS, Neg liver ROS,   Endo/Other  negative endocrine ROS  Renal/GU Renal InsufficiencyRenal disease  negative genitourinary   Musculoskeletal   Abdominal   Peds  Hematology negative hematology ROS (+)   Anesthesia Other Findings Missing upper right front and right more middle teeth. Snag remaining in upper right is chipped.    Reproductive/Obstetrics negative OB ROS                        Anesthesia Physical Anesthesia Plan  ASA: III  Anesthesia Plan: General   Post-op Pain Management:    Induction: Intravenous  Airway Management Planned: LMA  Additional Equipment:   Intra-op Plan:   Post-operative Plan: Extubation in OR  Informed Consent: I have reviewed the patients History and Physical, chart, labs and discussed the procedure including the risks, benefits and alternatives for the proposed anesthesia with the patient or authorized representative who has indicated his/her understanding and acceptance.     Plan Discussed with: CRNA  Anesthesia Plan Comments:         Anesthesia  Quick Evaluation

## 2011-06-12 NOTE — Interval H&P Note (Signed)
History and Physical Interval Note:  06/12/2011 10:21 AM  Stephanie Griffith  has presented today for surgery, with the diagnosis of gangrene left foot.  The various methods of treatment have been discussed with the patient and family. After consideration of risks, benefits and other options for treatment, the patient has consented to  Procedure(s) (LRB): AMPUTATION Above KNEE (Left) as a surgical intervention .  The patients' history has been reviewed, patient examined, no change in status, stable for surgery.  I have reviewed the patients' chart and labs.  Questions were answered to the patient's satisfaction.    Risks and benefits of above knee and below knee amputation discussed with pt and her daughters.  They have opted for AKA due to improved healing potential realizing that ambulation and rehab may be more difficult.   Debralee Braaksma E

## 2011-06-12 NOTE — Op Note (Signed)
[facility] OPERATIVE NOTE   PRE-OP DX: Gangrene left foot POST-OP DX: same  Procedure(s): AMPUTATION ABOVE KNEE Left  Surgeon(s): Elam Dutch, MD  ASSISTANT: Evorn Gong, PA-C  Anesthesia: General  COMPLICATIONS: NONE  EBL PER ANESTHESIA NOTE  DISPOSITION: PACU IN STABLE CONDITION   PROCEDURE DETAIL: After obtaining informed consent, the patient was taken to the operating room. The patient was placed in supine position the operating room table. After induction of general anesthesia and endotracheal intubation the patient's Foley catheter was placed. Next patient's entire left lower extremity was prepped and draped in usual sterile fashion. A circumferential incision was made on the left leg just above the knee. The incision was carried down into the sucutaneous tissues down to level the saphenous vein. This was ligated and divided between silk ties. Soft tissues were taken down as well as the muscle and fascia with cautery. The superficial femoral artery and vein were dissected free circumferentially clamped and divided. These were suture ligated proximally. An existing composite bypass graft was ligated and divided and the graft allowed to retract up into the thigh.  The sciatic nerve was ligated with a Vicryl tie and allowed to retract up into the thigh.  Remainder of the soft tissues were taken down with cautery. The periosteum was raised on the femur approximately 5 cm above the skin edge. The femur was divided at this level. The leg was passed off the table as a specimen. Hemostasis was obtained. The wound was thoroughly irrigated with normal saline solution. The fascial edges were reapproximated using interrupted 2 0 Vicryl sutures. The subcutaneous tissues reapproximated using a running 3-0 Vicryl suture. The skin was closed staples. Patient tolerated procedure well and there were no complications. Instrument sponge and needle counts correct in the case.  Patient was taken to recovery in stable condition.  [name], MD [facility] [place] Office: [phone] Pager: [phone]

## 2011-06-12 NOTE — H&P (View-Only) (Signed)
[facility] [place]  Progress Note Bypass Surgery  Date of Surgery: 05/31/2011 - 06/06/2011  Procedure(s): Left BYPASS GRAFT FEMORAL-POPLITEAL ARTERY AMPUTATION DIGIT Surgeon: Surgeon(s): Elam Dutch, MD  5 Days Post-Op  History of Present Illness  Stephanie Griffith is a 66 y.o. female who C/O pain in left foot and ankle. Still requiring morphine on occasion.   BP still high ST overnight - HR 93 this am  Significant Diagnostic Studies: CBC Lab Results  Component Value Date   WBC 26.3* 06/09/2011   HGB 9.4* 06/09/2011   HCT 27.9* 06/09/2011   MCV 85.8 06/09/2011   PLT 331 06/09/2011    BMET      Component Value Date/Time   NA 136 06/10/2011 0710   K 4.0 06/10/2011 0710   CL 101 06/10/2011 0710   CO2 26 06/10/2011 0710   GLUCOSE 96 06/10/2011 0710   BUN 24* 06/10/2011 0710   CREATININE 1.49* 06/10/2011 0710   CALCIUM 9.1 06/10/2011 0710   GFRNONAA 35* 06/10/2011 0710   GFRAA 41* 06/10/2011 0710    COAG Lab Results  Component Value Date   INR 1.13 06/04/2011   No results found for this basename: PTT    Physical Examination  BP Readings from Last 3 Encounters:  06/11/11 194/91  06/11/11 194/91  06/11/11 194/91   Temp Readings from Last 3 Encounters:  06/11/11 98.5 F (36.9 C) Oral  06/11/11 98.5 F (36.9 C) Oral  06/11/11 98.5 F (36.9 C) Oral   SpO2 Readings from Last 3 Encounters:  06/11/11 96%  06/11/11 96%  06/11/11 96%   Pulse Readings from Last 3 Encounters:  06/11/11 93  06/11/11 93  06/11/11 93    Pt is A&O x 3 left lower extremity: Incision/s is/are clean,dry.intact, and  healing without hematoma, erythema or drainage Limb is warm; with good color  Assessment: Pt. Doing well Post-op pain is poorly controlled Wounds are healing well WBC - high _ urine culture neg. Pre-op even with leukocytosis and many bact on U/A 2/28 - pt afebrile   Plan: PT/OT for ambulation Continue wound care as ordered  Stephanie Griffith [id number] 06/11/2011 7:54 AM  Details as above needs work up for leukocytosis.  Incisions don't seem to be source  Will check urine If negative will repeat chest xray No doppler signals in left foot, blistering at ankle level Foot cool dusky  Bypass is occluded will need BKA vs AKA.  Risks benefits d/w pt and daughters especially chance that BKA may not heal.  She had very poor runoff and a composite bypass.  I do not believe emergent thrombectomy of the bypass is warranted.  NPO p midnight Consent  Stephanie Hinds, MD [facility] [place] Office: [phone] Pager: [phone]

## 2011-06-12 NOTE — Progress Notes (Addendum)
Nutrition Follow-up  Diet Order:  NPO S/p amputation of left great toe on 3/6, some post op difficulty breathing.  Pt is currently undergoing L AKA. Prior to this patient PO intake was mostly 75-100% of Carb Mod Medium  Meds: Scheduled Meds:   . amLODipine  10 mg Oral Daily  . aspirin EC  81 mg Oral Daily  . atorvastatin  40 mg Oral q1800  . carvedilol  25 mg Oral BID WC  .  ceFAZolin (ANCEF) IV  2 g Intravenous On Call  . docusate sodium  100 mg Oral Daily  . enoxaparin (LOVENOX) injection  40 mg Subcutaneous Q24H  . famotidine  20 mg Oral Daily  . feeding supplement  237 mL Oral TID WC  . hydrochlorothiazide  25 mg Oral Daily  . potassium chloride  20 mEq Oral Daily  . DISCONTD: carvedilol  12.5 mg Oral BID WC   Continuous Infusions:   . sodium chloride 50 mL/hr at 06/12/11 1019   PRN Meds:.acetaminophen, acetaminophen, albuterol, ALPRAZolam, alum & mag hydroxide-simeth, bisacodyl, guaiFENesin-dextromethorphan, hydrALAZINE, ipratropium, labetalol, morphine injection, ondansetron, ondansetron, oxyCODONE, phenol, senna-docusate  Labs:  CMP     Component Value Date/Time   NA 131* 06/12/2011 0500   K 4.2 06/12/2011 0500   CL 96 06/12/2011 0500   CO2 25 06/12/2011 0500   GLUCOSE 113* 06/12/2011 0500   BUN 27* 06/12/2011 0500   CREATININE 1.77* 06/12/2011 0500   CALCIUM 8.9 06/12/2011 0500   PROT 6.7 06/04/2011 1218   ALBUMIN 2.6* 06/04/2011 1218   AST 36 06/04/2011 1218   ALT 11 06/04/2011 1218   ALKPHOS 96 06/04/2011 1218   BILITOT 0.3 06/04/2011 1218   GFRNONAA 29* 06/12/2011 0500   GFRAA 33* 06/12/2011 0500     Intake/Output Summary (Last 24 hours) at 06/12/11 1127 Last data filed at 06/11/11 1700  Gross per 24 hour  Intake    360 ml  Output    476 ml  Net   -116 ml    Weight Status:  82.4 kg, trending down with negative fluid balance, this is close to her reported UBW  Re-estimated needs:  1700-1900 kcal (based on 32-35 kcal/kg of ideal body weight adjusted for AKA), 85- 100 gm  protein adjusted for estimated weight loss from AKA  Re-estimated ideal body weight: 53.4 kg  Nutrition Dx:  Inadequate oral intake now related to inability to eat as evidenced by NPO diet order  Goal:  Diet will advance post op  Intervention:   No new nutrition interventions.  Monitor:  Diet advance, PO intake, weight, labs, I/O's   Coralee Rud Pager #:  [phone]

## 2011-06-12 NOTE — Anesthesia Procedure Notes (Addendum)
Procedure Name: Intubation Date/Time: 06/12/2011 11:21 AM Performed by: Williemae Area B Pre-anesthesia Checklist: Patient identified, Emergency Drugs available, Suction available and Patient being monitored Patient Re-evaluated:Patient Re-evaluated prior to inductionOxygen Delivery Method: Circle system utilized Preoxygenation: Pre-oxygenation with 100% oxygen Intubation Type: IV induction Ventilation: Mask ventilation without difficulty Laryngoscope Size: Mac and 3 Grade View: Grade II Tube type: Oral Tube size: 7.5 mm Number of attempts: 1 Airway Equipment and Method: Stylet Placement Confirmation: ETT inserted through vocal cords under direct vision,  positive ETCO2 and breath sounds checked- equal and bilateral Secured at: 21 (cm at teeth) cm Tube secured with: Tape Dental Injury: Teeth and Oropharynx as per pre-operative assessment

## 2011-06-12 NOTE — Progress Notes (Signed)
Physical Therapy Cancellation Note: pt scheduled to OR for BKA vs AKA today. Will defer therapy until after sx. Await post op orders. Thanks Elwyn Reach, [place]

## 2011-06-13 DIAGNOSIS — I739 Peripheral vascular disease, unspecified: Secondary | ICD-10-CM

## 2011-06-13 DIAGNOSIS — S78119A Complete traumatic amputation at level between unspecified hip and knee, initial encounter: Secondary | ICD-10-CM

## 2011-06-13 DIAGNOSIS — L98499 Non-pressure chronic ulcer of skin of other sites with unspecified severity: Secondary | ICD-10-CM

## 2011-06-13 LAB — BASIC METABOLIC PANEL
BUN: 24 mg/dL — ABNORMAL HIGH (ref 6–23)
CO2: 26 mEq/L (ref 19–32)
Calcium: 8.6 mg/dL (ref 8.4–10.5)
Chloride: 100 mEq/L (ref 96–112)
Creatinine, Ser: 1.63 mg/dL — ABNORMAL HIGH (ref 0.50–1.10)
GFR calc Af Amer: 37 mL/min — ABNORMAL LOW (ref >90)
GFR calc non Af Amer: 32 mL/min — ABNORMAL LOW (ref >90)
Glucose, Bld: 115 mg/dL — ABNORMAL HIGH (ref 70–99)
Potassium: 4.2 mEq/L (ref 3.5–5.1)
Sodium: 135 mEq/L (ref 135–145)

## 2011-06-13 LAB — CBC
HCT: 27.3 % — ABNORMAL LOW (ref 36.0–46.0)
Hemoglobin: 9 g/dL — ABNORMAL LOW (ref 12.0–15.0)
MCH: 29.1 pg (ref 26.0–34.0)
MCHC: 33 g/dL (ref 30.0–36.0)
MCV: 88.3 fL (ref 78.0–100.0)
Platelets: 393 10*3/uL (ref 150–400)
RBC: 3.09 MIL/uL — ABNORMAL LOW (ref 3.87–5.11)
RDW: 16.6 % — ABNORMAL HIGH (ref 11.5–15.5)
WBC: 14.4 10*3/uL — ABNORMAL HIGH (ref 4.0–10.5)

## 2011-06-13 NOTE — Consult Note (Signed)
Physical Medicine and Rehabilitation Consult Reason for Consult: Left above the knee amputation Referring Phsyician: Dr. Oneida Alar Moksha Fraire is an 66 y.o. female.   HPI: 66 year old right-handed female with history of hypertension chronic kidney disease stage III as well as recent left big toe infection with toenail removal by podiatry and antibiotic use as an outpatient. Admitted February 28 with nausea as well abdominal pain and white blood cell count of 17,000 and creatinine 2.97. Patient with dry gangrene of left lower extremity superimposed cellulitis and placed on intravenous antibiotics. Lower extremity arterial Doppler's performed which revealed ABI 0.17 in the left anterior tibial artery and 1.0 in the right lower extremity. Angiography of left lower extremity March 4 showed moderate stenosis in the proximal superficial femoral artery. There was moderate disease at the adductor canal which occludes with reconstitution of a small above the knee popliteal artery. Underwent right femoral to below knee popliteal bypass March 6 by Dr. Oneida Alar and trains metatarsal amputations of left first toe. Patient was extubated in the operating room but noted to have labored shallow breathing and PACU. Chest x-ray revealed right greater than left air spaced disease. Critical care medicine was consulted placed on him. Antibiotics for questionable aspiration pneumonia. In regards to her initial abdominal pain and nausea a CT of the abdomen and pelvis was completed showing multiple gallstones and some thickening of the gallbladder wall as well as ventral hernia. General surgery Dr. Excell Seltzer was consulted and noted planned for Colace a step to me and repair of ventral hernia once patient medically stable from a vascular issues. 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 Oneida Alar. Maintained on subcutaneous lovenox for DVT prophylaxis. M.D. has  requested physical medicine rehabilitation consult Patient was +2 total 50-70% with physical therapy today going from reclining chair to bed approximately 5 feet, pain occurred when sitting down again but not bad while she was up Review of Systems  Gastrointestinal: Positive for nausea and vomiting.  Musculoskeletal: Positive for joint pain.  All other systems reviewed and are negative.   Past Medical History  Diagnosis Date  . Hypertension   . Hernia   . Chronic renal insufficiency     Cr 1.38-1.93 in 2009   Past Surgical History  Procedure Date  . Tubal ligation   . Femoral-popliteal bypass graft 06/06/2011    Procedure: BYPASS GRAFT FEMORAL-POPLITEAL ARTERY;  Surgeon: Elam Dutch, MD;  Location: [facility] OR;  Service: Vascular;  Laterality: Left;  left femoral to popliteal bypass with composite vein and propaten 36mm x 80 graft  . Amputation 06/06/2011    Procedure: AMPUTATION DIGIT;  Surgeon: Elam Dutch, MD;  Location: [facility] OR;  Service: Vascular;  Laterality: Left;  Transmetatarsal amputation left great toe   Family History  Problem Relation Age of Onset  . Heart attack Father   . Heart attack Brother    Social History:  reports that she has been smoking.  She has never used smokeless tobacco. She reports that she does not drink alcohol or use illicit drugs. Allergies: No Known Allergies Medications Prior to Admission  Medication Dose Route Frequency Provider Last Rate Last Dose  . 0.9 %  sodium chloride infusion   Intravenous STAT Abran Richard, [place]      . 0.9 %  sodium chloride infusion   Intravenous Continuous Barton Dubois, MD 150 mL/hr at 06/01/11 0106 1,000 mL at 06/01/11 0106  . 0.9 %  sodium chloride infusion  500 mL Intravenous Once PRN Ulyses Amor, PA      . acetaminophen (TYLENOL) tablet 325-650 mg  325-650 mg Oral Q4H PRN Lindell Spar, PA       Or  . acetaminophen (TYLENOL) suppository 325-650 mg  325-650 mg Rectal Q4H PRN Lindell Spar, PA       . albuterol (PROVENTIL) (5 MG/ML) 0.5% nebulizer solution 2.5 mg  2.5 mg Nebulization Once Roberts Gaudy, MD   2.5 mg at 06/06/11 1415  . ipratropium (ATROVENT) nebulizer solution 0.5 mg  0.5 mg Nebulization Q2H PRN Doree Fudge, MD       And  . albuterol (PROVENTIL) (5 MG/ML) 0.5% nebulizer solution 2.5 mg  2.5 mg Nebulization Q2H PRN Doree Fudge, MD      . alum & mag hydroxide-simeth (MAALOX/MYLANTA) 200-200-20 MG/5ML suspension 15-30 mL  15-30 mL Oral Q2H PRN Elam Dutch, MD   30 mL at 06/12/11 0848  . amLODipine (NORVASC) tablet 10 mg  10 mg Oral Daily Barton Dubois, MD   10 mg at 06/11/11 0952  . Ampicillin-Sulbactam (UNASYN) 3 g in sodium chloride 0.9 % 100 mL IVPB  3 g Intravenous Q6H Elam Dutch, MD   3 g at 06/08/11 0831  . aspirin EC tablet 81 mg  81 mg Oral Daily Jolaine Artist, MD   81 mg at 06/11/11 [id number]  . atorvastatin (LIPITOR) tablet 40 mg  40 mg Oral q1800 Jolaine Artist, MD   40 mg at 06/12/11 1714  . bisacodyl (DULCOLAX) EC tablet 5 mg  5 mg Oral Daily PRN Ulyses Amor, PA   5 mg at 06/07/11 0909  . carvedilol (COREG) tablet 25 mg  25 mg Oral BID WC Lindell Spar, PA   25 mg at 06/12/11 1714  . ceFAZolin (ANCEF) IVPB 2 g/50 mL premix  2 g Intravenous On Call Elam Dutch, MD   2 g at 06/12/11 1135  . cefUROXime (ZINACEF) 1.5 g in dextrose 5 % 50 mL IVPB  1.5 g Intravenous Q12H Lindell Spar, PA   1.5 g at 06/12/11 2228  . digoxin (LANOXIN) 0.25 MG/ML injection 0.5 mg  0.5 mg Intravenous Once Angelia Mould, MD   0.5 mg at 06/08/11 0646  . docusate sodium (COLACE) capsule 100 mg  100 mg Oral Daily Ulyses Amor, PA   100 mg at 06/11/11 [id number]  . enoxaparin (LOVENOX) injection 40 mg  40 mg Subcutaneous Q24H Lindell Spar, PA      . famotidine (PEPCID) tablet 20 mg  20 mg Oral Daily Elam Dutch, MD   20 mg at 06/11/11 [id number]  . feeding supplement (ENSURE CLINICAL STRENGTH) liquid 237 mL  237 mL Oral TID  WC Sherre Scarlet, RD   237 mL at 06/12/11 1718  . fentaNYL (SUBLIMAZE) 0.05 MG/ML injection           . furosemide (LASIX) injection 10 mg  10 mg Intravenous Once Pearletha Furl, MD   10 mg at 06/06/11 1433  . guaiFENesin-dextromethorphan (ROBITUSSIN DM) 100-10 MG/5ML syrup 15 mL  15 mL Oral Q4H PRN Ulyses Amor, PA      . heparin 2-0.9 UNIT/ML-% infusion           . hydrALAZINE (APRESOLINE) injection 10 mg  10 mg Intravenous Q2H PRN Ulyses Amor, PA   10 mg at 06/11/11 0650  . hydrochlorothiazide (HYDRODIURIL) tablet 25 mg  25 mg  Oral Daily Jolaine Artist, MD   25 mg at 06/11/11 [id number]  . labetalol (NORMODYNE,TRANDATE) 5 MG/ML injection           . labetalol (NORMODYNE,TRANDATE) injection 10 mg  10 mg Intravenous Q2H PRN Ulyses Amor, PA   10 mg at 06/08/11 0110  . lidocaine (XYLOCAINE) 1 % injection           . magnesium sulfate IVPB 2 g 50 mL  2 g Intravenous Once PRN Ulyses Amor, PA      . metoprolol (LOPRESSOR) injection 2-5 mg  2-5 mg Intravenous Q2H PRN Ulyses Amor, PA   5 mg at 06/08/11 1026  . metoprolol (LOPRESSOR) injection 2-5 mg  2-5 mg Intravenous Q2H PRN Lindell Spar, PA      . midazolam (VERSED) 2 MG/2ML injection           . morphine 2 MG/ML injection 2-5 mg  2-5 mg Intravenous Q1H PRN Ulyses Amor, PA   4 mg at 06/12/11 2228  . morphine 2 MG/ML injection           . morphine 4 MG/ML injection 4 mg  4 mg Intravenous Once Abran Richard, PA   4 mg at 05/31/11 1102  . ondansetron (ZOFRAN) injection 4 mg  4 mg Intravenous Once Abran Richard, PA   4 mg at 05/31/11 1101  . ondansetron (ZOFRAN) injection 4 mg  4 mg Intravenous Q6H PRN Ulyses Amor, PA      . ondansetron [facility]) tablet 4 mg  4 mg Oral Q6H PRN Barton Dubois, MD      . oxyCODONE (Oxy IR/ROXICODONE) immediate release tablet 5-10 mg  5-10 mg Oral Q4H PRN Ulyses Amor, PA   10 mg at 06/13/11 0229  . pantoprazole (PROTONIX) injection 40 mg  40 mg Intravenous Once Abran Richard, PA   40 mg at 05/31/11 1500  . phenol (CHLORASEPTIC) mouth spray 1 spray  1 spray Mouth/Throat PRN Ulyses Amor, PA      . piperacillin-tazobactam (ZOSYN) IVPB 3.375 g  3.375 g Intravenous Once Randa Spike, PHARMD   3.375 g at 05/31/11 1759  . pneumococcal 23 valent vaccine (PNU-IMMUNE) injection 0.5 mL  0.5 mL Intramuscular Tomorrow-1000 Barton Dubois, MD   0.5 mL at 06/01/11 0926  . potassium chloride SA (K-DUR,KLOR-CON) CR tablet 20 mEq  20 mEq Oral Daily Jolaine Artist, MD   20 mEq at 06/11/11 0952  . potassium chloride SA (K-DUR,KLOR-CON) CR tablet 20-40 mEq  20-40 mEq Oral Once PRN Ulyses Amor, PA      . potassium chloride SA (K-DUR,KLOR-CON) CR tablet 20-40 mEq  20-40 mEq Oral Once PRN Lindell Spar, PA      . senna-docusate (Senokot-S) tablet 1 tablet  1 tablet Oral QHS PRN Ulyses Amor, PA      . sodium chloride 0.9 % bolus 1,000 mL  1,000 mL Intravenous Once Abran Richard, PA   1,000 mL at 05/31/11 1102  . sodium chloride 0.9 % bolus 1,000 mL  1,000 mL Intravenous Once Abran Richard, PA   1,000 mL at 05/31/11 1227  . vancomycin (VANCOCIN) 750 mg in sodium chloride 0.9 % 150 mL IVPB  750 mg Intravenous Q24H Ulyses Amor, PA   750 mg at 06/06/11 2041  . vancomycin (VANCOCIN) IVPB 1000 mg/200 mL premix  1,000 mg Intravenous Once Randa Spike, PHARMD   1,000 mg at 05/31/11 1828  . DISCONTD: 0.9 %  sodium chloride infusion   Intravenous Once Abran Richard, [place]      . DISCONTD: 0.9 %  sodium chloride infusion   Intravenous Continuous Reyne Dumas, MD 50 mL/hr at 06/05/11 0830    . DISCONTD: 0.9 %  sodium chloride infusion   Intravenous Continuous Ulyses Amor, PA 100 mL/hr at 06/07/11 0800    . DISCONTD: 0.9 %  sodium chloride infusion   Intravenous Continuous Sydnee Levans, MD 50 mL/hr at 06/12/11 1019    . DISCONTD: 0.9 % irrigation (POUR BTL)    PRN Elam Dutch, MD   2,000 mL at 06/06/11 0940  . DISCONTD: 0.9 % irrigation (POUR  BTL)    PRN Elam Dutch, MD   1,000 mL at 06/12/11 1157  . DISCONTD: acetaminophen (TYLENOL) suppository 325-650 mg  325-650 mg Rectal Q4H PRN Ulyses Amor, PA      . DISCONTD: acetaminophen (TYLENOL) suppository 650 mg  650 mg Rectal Q6H PRN Barton Dubois, MD      . DISCONTD: acetaminophen (TYLENOL) tablet 325-650 mg  325-650 mg Oral Q4H PRN Ulyses Amor, PA      . DISCONTD: acetaminophen (TYLENOL) tablet 650 mg  650 mg Oral Q6H PRN Barton Dubois, MD      . DISCONTD: acetaminophen (TYLENOL) tablet 650 mg  650 mg Oral Q4H PRN Angelia Mould, MD      . DISCONTD: acetylcysteine (MUCOMYST) NICU oral/rectal solution 10%  1 mL/kg Oral BID [name], [place]      . DISCONTD: albuterol (PROVENTIL) (5 MG/ML) 0.5% nebulizer solution 2.5 mg  2.5 mg Nebulization Q4H Doree Fudge, MD   2.5 mg at 06/07/11 0445  . DISCONTD: ALPRAZolam Duanne Moron) tablet 1 mg  1 mg Oral QHS PRN Elam Dutch, MD      . DISCONTD: aspirin EC tablet 325 mg  325 mg Oral Daily Angelia Mould, MD   325 mg at 06/05/11 1011  . DISCONTD: aspirin EC tablet 81 mg  81 mg Oral Daily Barton Dubois, MD      . DISCONTD: aspirin EC tablet 81 mg  81 mg Oral Daily Barton Dubois, MD   81 mg at 06/03/11 0909  . DISCONTD: aspirin tablet 162.5 mg  162.5 mg Oral Daily Jolaine Artist, MD      . DISCONTD: aspirin tablet 325 mg  325 mg Oral Daily Elam Dutch, MD   325 mg at 06/09/11 1127  . DISCONTD: carvedilol (COREG) tablet 12.5 mg  12.5 mg Oral BID WC Jolaine Artist, MD   12.5 mg at 06/11/11 1635  . DISCONTD: carvedilol (COREG) tablet 25 mg  25 mg Oral BID WC Fay Records, MD   25 mg at 06/12/11 0630  . DISCONTD: cefUROXime (ZINACEF) 1.5 g in dextrose 5 % 50 mL IVPB  1.5 g Intravenous Q12H Ulyses Amor, PA      . DISCONTD: digoxin (LANOXIN) 0.25 MG/ML injection 0.125 mg  0.125 mg Intravenous Q4H Angelia Mould, MD   0.125 mg at 06/08/11 1100  . DISCONTD: digoxin (LANOXIN) tablet 0.125 mg  0.125  mg Oral Daily Angelia Mould, MD      . DISCONTD: DOPamine (INTROPIN) 800 mg in dextrose 5 % 250 mL infusion  3-5 mcg/kg/min Intravenous Continuous Ulyses Amor, PA      . DISCONTD: enoxaparin (LOVENOX) injection 100 mg  100 mg Subcutaneous Q12H Dimple Nanas, PHARMD   100 mg at 06/08/11 1439  . DISCONTD:  enoxaparin (LOVENOX) injection 30 mg  30 mg Subcutaneous Q24H Barton Dubois, MD   30 mg at 06/01/11 2141  . DISCONTD: enoxaparin (LOVENOX) injection 40 mg  40 mg Subcutaneous Q24H Bonnielee Haff, MD   40 mg at 06/02/11 2133  . DISCONTD: enoxaparin (LOVENOX) injection 40 mg  40 mg Subcutaneous QHS Angelia Mould, MD   40 mg at 06/05/11 2221  . DISCONTD: enoxaparin (LOVENOX) injection 40 mg  40 mg Subcutaneous Q24H Lavonia Drafts [place], PHARMD   40 mg at 06/11/11 0953  . DISCONTD: enoxaparin (LOVENOX) injection 50 mg  50 mg Subcutaneous Q24H Thuy Dien Dang, PHARMD   50 mg at 06/09/11 1127  . DISCONTD: famotidine (PEPCID) IVPB 20 mg  20 mg Intravenous Q12H Ulyses Amor, PA   20 mg at 06/07/11 2104  . DISCONTD: famotidine (PEPCID) IVPB 20 mg  20 mg Intravenous Q12H Lindell Spar, PA      . DISCONTD: fentaNYL (SUBLIMAZE) injection 50-100 mcg  50-100 mcg Intravenous PRN Sydnee Levans, MD      . DISCONTD: furosemide (LASIX) injection 20 mg  20 mg Intravenous Once Lindell Spar, PA      . DISCONTD: furosemide (LASIX) injection 40 mg  40 mg Intravenous BID Dayna N Dunn, PA   40 mg at 06/09/11 0805  . DISCONTD: furosemide (LASIX) tablet 40 mg  40 mg Oral Daily Jolaine Artist, MD      . DISCONTD: heparin 6,000 Units in sodium chloride irrigation 0.9 % 500 mL irrigation    PRN Elam Dutch, MD      . DISCONTD: hydrALAZINE (APRESOLINE) injection 10 mg  10 mg Intravenous Q6H PRN Barton Dubois, MD   10 mg at 06/04/11 0957  . DISCONTD: HYDROmorphone (DILAUDID) injection 0.25-0.5 mg  0.25-0.5 mg Intravenous Q5 min PRN Roberts Gaudy, MD      . DISCONTD: HYDROmorphone  (DILAUDID) injection 0.25-0.5 mg  0.25-0.5 mg Intravenous Q5 min PRN W. Oren Bracket, MD      . DISCONTD: ipratropium (ATROVENT) nebulizer solution 0.5 mg  0.5 mg Nebulization Q4H Doree Fudge, MD   0.5 mg at 06/07/11 0445  . DISCONTD: magnesium sulfate IVPB 2 g 50 mL  2 g Intravenous Once PRN Lindell Spar, PA      . DISCONTD: metoprolol (LOPRESSOR) tablet 50 mg  50 mg Oral BID Jolaine Artist, MD   50 mg at 06/09/11 2119  . DISCONTD: metoprolol tartrate (LOPRESSOR) tablet 25 mg  25 mg Oral BID Lindell Spar, PA   25 mg at 06/08/11 1252  . DISCONTD: metoprolol tartrate (LOPRESSOR) tablet 25 mg  25 mg Oral Q6H Dayna N Dunn, PA   25 mg at 06/09/11 0609  . DISCONTD: midazolam (VERSED) injection 1-2 mg  1-2 mg Intravenous PRN Sydnee Levans, MD      . DISCONTD: morphine 2 MG/ML injection 2 mg  2 mg Intravenous Q3H PRN Barton Dubois, MD   2 mg at 06/06/11 0537  . DISCONTD: nicotine (NICODERM CQ - dosed in mg/24 hours) patch 14 mg  14 mg Transdermal Daily Barton Dubois, MD   14 mg at 06/05/11 1013  . DISCONTD: ondansetron (ZOFRAN) injection 4 mg  4 mg Intravenous Q6H PRN Barton Dubois, MD   4 mg at 06/05/11 2217  . DISCONTD: ondansetron (ZOFRAN) injection 4 mg  4 mg Intravenous Q6H PRN Angelia Mould, MD      . DISCONTD: ondansetron [facility]) injection 4 mg  4 mg Intravenous Once  PRN Roberts Gaudy, MD      . DISCONTD: oxyCODONE (Oxy IR/ROXICODONE) immediate release tablet 5 mg  5 mg Oral Q4H PRN Barton Dubois, MD   5 mg at 06/05/11 0940  . DISCONTD: oxyCODONE-acetaminophen (PERCOCET) 5-325 MG per tablet 1-2 tablet  1-2 tablet Oral Q3H PRN Angelia Mould, MD   2 tablet at 06/06/11 0235  . DISCONTD: pantoprazole (PROTONIX) EC tablet 40 mg  40 mg Oral Q1200 Barton Dubois, MD   40 mg at 06/04/11 1137  . DISCONTD: piperacillin-tazobactam (ZOSYN) IVPB 3.375 g  3.375 g Intravenous Q8H Christine E Shade, PHARMD   3.375 g at 06/06/11 0537  . DISCONTD:  piperacillin-tazobactam (ZOSYN) IVPB 3.375 g  3.375 g Intravenous Q8H Ulyses Amor, PA   3.375 g at 06/07/11 0534  . DISCONTD: polyethylene glycol (MIRALAX / GLYCOLAX) packet 17 g  17 g Oral Daily PRN Barton Dubois, MD      . DISCONTD: sodium bicarbonate 50 mEq in dextrose 5 % 1,000 mL infusion   Intravenous Continuous Bonnielee Haff, MD 100 mL/hr at 06/02/11 1359    . DISCONTD: sodium bicarbonate 50 mEq in sodium chloride 0.45 % 1,000 mL infusion   Intravenous Continuous Reyne Dumas, MD 75 mL/hr at 06/04/11 2235    . DISCONTD: Surgifoam 100 with Thrombin 20,000 units (20 ml) topical solution    PRN Elam Dutch, MD      . DISCONTD: vancomycin (VANCOCIN) 750 mg in sodium chloride 0.9 % 150 mL IVPB  750 mg Intravenous Q24H Randa Spike, PHARMD   750 mg at 06/02/11 2133  . DISCONTD: vancomycin (VANCOCIN) 750 mg in sodium chloride 0.9 % 150 mL IVPB  750 mg Intravenous Q12H Thuy Dien Dang, PHARMD      . DISCONTD: vancomycin (VANCOCIN) 750 mg in sodium chloride 0.9 % 150 mL IVPB  750 mg Intravenous Q12H Reyne Dumas, MD   750 mg at 06/05/11 1742  . DISCONTD: vancomycin (VANCOCIN) 750 mg in sodium chloride 0.9 % 150 mL IVPB  750 mg Intravenous Q24H Reyne Dumas, MD       Medications Prior to Admission  Medication Sig Dispense Refill  . aspirin 81 MG EC tablet Take 81 mg by mouth daily.        . hydrochlorothiazide 25 MG tablet Take 25 mg by mouth daily.        . metoprolol (LOPRESSOR) 25 MG tablet Take 25 mg by mouth 2 (two) times daily.          Home: Home Living Lives With: Spouse Type of Home: House Home Layout: One level Home Access: Stairs to enter Entrance Stairs-Rails: Left;Right;Can reach both Entrance Stairs-Number of Steps: 3 Home Adaptive Equipment: None  Functional History: Prior Function Level of Independence: Independent with basic ADLs;Independent with transfers;Independent with homemaking with ambulation;Independent with gait Driving: No Vocation:  Retired Comments: Pt lives with spouse but plans to D/C to dgtr's apt with 3 steps who can provide 24hr care Functional Status:  Mobility: Bed Mobility Bed Mobility: Yes Supine to Sit: With rails;HOB elevated (Comment degrees);6: Modified independent (Device/Increase time) (HOB 10degrees) Supine to Sit Details (indicate cue type and reason): increased time Sitting - Scoot to Edge of Bed: 6: Modified independent (Device/Increase time) Sitting - Scoot to Edge of Bed Details (indicate cue type and reason): increased time Transfers Transfers: Yes Sit to Stand: 4: Min assist;From bed Sit to Stand Details (indicate cue type and reason): cues for hand placement, RLE position and sequence Stand to Sit:  5: Supervision Stand to Sit Details: cues for hand placement, safety and control of descent Stand Pivot Transfers: 4: Min assist Stand Pivot Transfer Details (indicate cue type and reason): cueing for sequence and to maintain heel weight bearing pt trying to rock forward onto Left toes. 2-3 pivotal steps bed to chair with RW Ambulation/Gait Ambulation/Gait: Yes Ambulation/Gait Assistance: 4: Min assist Ambulation/Gait Assistance Details (indicate cue type and reason): cues for sequence, pt maintaining trunk flexion and elbow flexion despite max cueing for upright trunk and bilUE use to assist with stepping Ambulation Distance (Feet): 16 Feet Assistive device: Rolling walker Gait Pattern: Step-to pattern;Decreased step length - right;Decreased step length - left;Trunk flexed Gait velocity: slowly Stairs: No Wheelchair Mobility Wheelchair Mobility: No  ADL:    Cognition: Cognition Arousal/Alertness: Awake/alert Orientation Level: Oriented X4 Cognition Arousal/Alertness: Awake/alert Overall Cognitive Status: Appears within functional limits for tasks assessed Orientation Level: Oriented X4 Cognition - Other Comments: Pt slow to problem solve gait and new instructions for mobility  Blood  pressure 139/59, pulse 80, temperature 98.8 F (37.1 C), temperature source Oral, resp. rate 18, height 5\' 8"  (1.727 m), weight 82.4 kg (181 lb 10.5 oz), SpO2 95.00%. Physical Exam  Vitals reviewed. Constitutional: She is oriented to person, place, and time. She appears well-developed.  HENT:  Head: Normocephalic.  Neck: Normal range of motion. Neck supple. No thyromegaly present.  Cardiovascular: Normal rate.   Pulmonary/Chest: Breath sounds normal. She has no wheezes.  Abdominal: She exhibits no distension. There is no tenderness.  Neurological: She is alert and oriented to person, place, and time.  Skin:       Incision clean and dry with dressing in place  Psychiatric: She has a normal mood and affect.    Results for orders placed during the hospital encounter of 05/31/11 (from the past 24 hour(s))  GLUCOSE, CAPILLARY     Status: Abnormal   Collection Time   06/12/11 12:52 PM      Component Value Range   Glucose-Capillary 105 (*) 70 - 99 (mg/dL)   Comment 1 Notify RN     No results found.  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 Does the need for close, 24 hr/day medical supervision in concert with the patient's rehab needs make it unreasonable for this patient to be served in a less intensive setting? Yes 2. Co-Morbidities requiring supervision/potential complications: Acute on chronic renal failure, cholelithiasis, atrial for ablation, coronary artery disease, hypertension 3. Due to bladder management, bowel management, safety, skin/wound care, disease management, medication administration, pain management and patient education, does the patient require 24 hr/day rehab nursing? Yes 4. Does the patient require coordinated care of a physician, rehab nurse, PT (1-2 hrs/day, 5 days/week) and OT (1-2 hrs/day, 5 days/week) to address physical and functional deficits in the context of the above medical diagnosis(es)?  Yes Addressing deficits in the following areas: balance, endurance, locomotion, strength, transferring, bathing, dressing and toileting 5. Can the patient actively participate in an intensive therapy program of at least 3 hrs of therapy per day at least 5 days per week? Yes 6. The potential for patient to make measurable gains while on inpatient rehab is excellent 7. Anticipated functional outcomes upon discharge from inpatients are supervision for mobility PT, modified independent upper body and supervision lower body ADLs OT, not applicable SLP 8. Estimated rehab length of stay to reach the above functional goals is: 7-10 days 9. Does the patient have adequate social supports  to accommodate these discharge functional goals? Yes 10. Anticipated D/C setting: Home 11. Anticipated post D/C treatments: [place] therapy 12. Overall Rehab/Functional Prognosis: excellent  RECOMMENDATIONS: This patient's condition is appropriate for continued rehabilitative care in the following setting: CIR Patient has agreed to participate in recommended program. Yes Note that insurance prior authorization may be required for reimbursement for recommended care.  Comment:   ANGIULLI,DANIEL J. 06/13/2011

## 2011-06-13 NOTE — Progress Notes (Signed)
Patient discussed at the Long Length of Stay Stephanie Griffith [name] 06/13/2011

## 2011-06-13 NOTE — Evaluation (Signed)
Occupational Therapy Evaluation Patient Details Name: Stephanie Griffith MRN: [id number] DOB: September 18, 1945 Today's Date: 06/13/2011  Problem List:  Patient Active Problem List  Diagnoses  . ANEMIA-NOS  . Essential hypertension, benign  . Chronic kidney disease (CKD), stage III (moderate)  . CHEST PAIN, HX OF  . Dry gangrene  . Abdominal pain  . Leukocytosis  . Hyponatremia  . Hyperglycemia  . Acute on chronic renal failure  . PAD (peripheral artery disease)  . Cholelithiasis and cholecystitis without obstruction  . Ventral hernia  . Atrial fibrillation  . NSTEMI (non-ST elevated myocardial infarction)  . Acute diastolic heart failure    Past Medical History:  Past Medical History  Diagnosis Date  . Hypertension   . Hernia   . Chronic renal insufficiency     Cr 1.38-1.93 in 2009   Past Surgical History:  Past Surgical History  Procedure Date  . Tubal ligation   . Femoral-popliteal bypass graft 06/06/2011    Procedure: BYPASS GRAFT FEMORAL-POPLITEAL ARTERY;  Surgeon: Elam Dutch, MD;  Location: [facility] OR;  Service: Vascular;  Laterality: Left;  left femoral to popliteal bypass with composite vein and propaten 45mm x 80 graft  . 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 Pt presents with decreased independence with basic ADLs and will benefit from skilled OT in the acute setting followed by CIR to maximize I with ADL and ADL mobility to a Mod I-S level upon d/c home.  OT Recommendation/Assessment: Patient will need skilled OT in the acute care venue OT Problem List: Decreased activity tolerance;Decreased range of motion;Impaired balance (sitting and/or standing);Decreased  knowledge of use of DME or AE;Decreased knowledge of precautions;Impaired sensation;Obesity;Pain;Increased edema OT Therapy Diagnosis : Acute pain;Generalized weakness OT Plan OT Frequency: Min 2X/week OT Treatment/Interventions: Self-care/ADL training;Therapeutic exercise;DME and/or AE instruction;Therapeutic activities;Patient/family education;Balance training OT Recommendation Recommendations for Other Services: Rehab consult Follow Up Recommendations: Inpatient Rehab Equipment Recommended: Defer to next venue Individuals Consulted Consulted and Agree with Results and Recommendations: Patient;Family member/caregiver Family Member Consulted: dtr OT Goals Acute Rehab OT Goals OT Goal Formulation: With patient/family Time For Goal Achievement: 2 weeks ADL Goals Pt Will Perform Grooming: with min assist;Standing at sink ADL Goal: Grooming - Progress: Goal set today Pt Will Perform Lower Body Dressing: with min assist;Sit to stand from chair;Sit to stand from bed ADL Goal: Lower Body Dressing - Progress: Goal set today Pt Will Transfer to Toilet: with supervision;with DME;3-in-1;Stand pivot transfer ADL Goal: Toilet Transfer - Progress: Goal set today Pt Will Perform Toileting - Clothing Manipulation: with supervision;Standing ADL Goal: Toileting - Clothing Manipulation - Progress: Goal set today Pt Will Perform Toileting - Hygiene: with supervision;Standing at 3-in-1/toilet;Sit to stand from 3-in-1/toilet ADL Goal: Toileting - Hygiene - Progress: Goal set today Additional ADL Goal #1: Pt will be I with bilateral UE HEP to increase strength and endurance in prep for ADLs/ADL mobility. ADL Goal: Additional Goal #1 - Progress: Goal set today  OT Evaluation Precautions/Restrictions  Precautions Precautions: Fall Required Braces or Orthoses: Yes Other Brace/Splint: Darco Restrictions Weight Bearing Restrictions: No Other Position/Activity Restrictions: NWB left residual limb Prior  Functioning Home Living Lives With: Spouse Type of Home: House Home Layout: One level Home Access: Stairs to  enter Entrance Stairs-Rails: Left;Right;Can reach both Entrance Stairs-Number of Steps: 3 Bathroom Shower/Tub: Geologist, engineering Equipment: None Prior Function Level of Independence: Independent with basic ADLs;Independent with transfers;Independent with homemaking with ambulation;Independent with gait Driving: No Vocation: Retired ADL ADL Eating/Feeding: Simulated;Independent Where Assessed - Eating/Feeding: Chair Grooming: Simulated;Wash/dry face;Set up Where Assessed - Grooming: Sitting, chair Upper Body Bathing: Simulated;Set up Where Assessed - Upper Body Bathing: Sitting, chair Lower Body Bathing: Simulated;Maximal assistance Where Assessed - Lower Body Bathing: Sit to stand from chair Upper Body Dressing: Simulated;Set up Where Assessed - Upper Body Dressing: Sitting, chair Lower Body Dressing: Simulated;+1 Total assistance Where Assessed - Lower Body Dressing: Sit to stand from chair Toilet Transfer: Performed;+2 Total assistance (pt=60%) Toilet Transfer Details (indicate cue type and reason): stand with pivotal steps with RLE to bedside commode. pt unable to fully unweight RLE (as with hopping)  Armed forces technical officer Method: Arts development officer: Pharmacist, community - Clothing Manipulation: Performed;+1 Total assistance Where Assessed - Camera operator Manipulation: Standing Toileting - Hygiene: Performed;Minimal assistance Where Assessed - Toileting Hygiene: Standing Tub/Shower Transfer: Not assessed Equipment Used: Rolling walker Ambulation Related to ADLs: see toilet transfer Vision/Perception  Vision - History Baseline Vision: Wears glasses all the time Cognition Cognition Arousal/Alertness: Awake/alert Orientation Level: Oriented to person;Oriented to place;Oriented to situation Cognition -  Other Comments: Pt unable to recall date (month, day or year) This was concerning to her as she normally does not have issues with this- may be med related? Sensation/Coordination Sensation Light Touch: Impaired Detail Light Touch Impaired Details: Impaired LLE (also reports "phantom pains") Coordination Gross Motor Movements are Fluid and Coordinated: Yes Fine Motor Movements are Fluid and Coordinated: Yes Extremity Assessment RUE Assessment RUE Assessment: Within Functional Limits LUE Assessment LUE Assessment: Within Functional Limits Mobility  Bed Mobility Supine to Sit: With rails;HOB elevated (Comment degrees);1: +2 Total assist (HOB at ~30degrees. Pt=40%) Supine to Sit Details (indicate cue type and reason): assist to bring RLE to EOB, VC for hand placement, assist to bring trunk upright and use of pad to bring pt's hips to EOB Exercises Other Exercises Other Exercises: armrest push ups bilUE x 5  End of Session OT - End of Session Equipment Utilized During Treatment: Gait belt Activity Tolerance: Patient tolerated treatment well Patient left: in chair;with call bell in reach;with family/visitor present Nurse Communication: Mobility status for transfers;Mobility status for ambulation General Behavior During Session: [facility] for tasks performed   Raidyn Wassink 06/13/2011, 11:37 AM

## 2011-06-13 NOTE — Plan of Care (Signed)
Problem: Phase I Progression Outcomes Goal: OOB as tolerated unless otherwise ordered Outcome: Progressing Pt OOB to chair today with therapies. Pt +2totalpt60% for SPT bed->recliner.

## 2011-06-13 NOTE — Progress Notes (Signed)
[facility] [place]  Postoperative Visit - Amputation  Date of Surgery: 05/31/2011 - 06/12/2011 Procedure(s): AMPUTATION ABOVE KNEE Left Surgeon: Surgeon(s): Elam Dutch, MD POD: 1 Day Post-Op  Subjective Stephanie Griffith is a 66 y.o. female who is S/P Left AMPUTATION ABOVE KNEE.  Pt.denies increased pain in the stump. The patient notes pain is well controlled. Pt. reports phantom pain.  Significant Diagnostic Studies: CBC Lab Results  Component Value Date   WBC 14.4* 06/13/2011   HGB 9.0* 06/13/2011   HCT 27.3* 06/13/2011   MCV 88.3 06/13/2011   PLT 393 06/13/2011    BMET    Component Value Date/Time   NA 135 06/13/2011 0500   K 4.2 06/13/2011 0500   CL 100 06/13/2011 0500   CO2 26 06/13/2011 0500   GLUCOSE 115* 06/13/2011 0500   BUN 24* 06/13/2011 0500   CREATININE 1.63* 06/13/2011 0500   CALCIUM 8.6 06/13/2011 0500   GFRNONAA 32* 06/13/2011 0500   GFRAA 37* 06/13/2011 0500    COAG Lab Results  Component Value Date   INR 1.13 06/04/2011   No results found for this basename: PTT     Intake/Output Summary (Last 24 hours) at 06/13/11 0902 Last data filed at 06/12/11 1700  Gross per 24 hour  Intake   1340 ml  Output    150 ml  Net   1190 ml   Patient Vitals for the past 24 hrs:  Urine Occurrence  06/12/11 1700 1      Physical Examination  BP Readings from Last 3 Encounters:  06/13/11 144/72  06/13/11 144/72  06/13/11 144/72   Temp Readings from Last 3 Encounters:  06/13/11 98.4 F (36.9 C) Oral  06/13/11 98.4 F (36.9 C) Oral  06/13/11 98.4 F (36.9 C) Oral   SpO2 Readings from Last 3 Encounters:  06/13/11 92%  06/13/11 92%  06/13/11 92%   Pulse Readings from Last 3 Encounters:  06/13/11 83  06/13/11 83  06/13/11 83    Pt is A&Ox3  WDWN female with no complaints  Left amputation wound is healing well.  There is good bone coverage in the stump Stump is warm and well perfused, without drainage; without  erythema   Assessment/plan:  Stephanie Griffith is a 66 y.o. female who is s/p Left  AMPUTATION ABOVE KNEE  The patient's stump is viable.  PT/OT  CIR consulting for poss rehab option  Demtrius Rounds J 9:02 AM 06/13/2011 [id number]

## 2011-06-13 NOTE — Evaluation (Signed)
Physical Re-Evaluation Patient Details Name: Stephanie Griffith MRN: [id number] DOB: 05-Jul-1945 Today's Date: 06/13/2011  Problem List:  Patient Active Problem List  Diagnoses  . ANEMIA-NOS  . Essential hypertension, benign  . Chronic kidney disease (CKD), stage III (moderate)  . CHEST PAIN, HX OF  . Dry gangrene  . Abdominal pain  . Leukocytosis  . Hyponatremia  . Hyperglycemia  . Acute on chronic renal failure  . PAD (peripheral artery disease)  . Cholelithiasis and cholecystitis without obstruction  . Ventral hernia  . Atrial fibrillation  . NSTEMI (non-ST elevated myocardial infarction)  . Acute diastolic heart failure    Past Medical History:  Past Medical History  Diagnosis Date  . Hypertension   . Hernia   . Chronic renal insufficiency     Cr 1.38-1.93 in 2009   Past Surgical History:  Past Surgical History  Procedure Date  . Tubal ligation   . Femoral-popliteal bypass graft 06/06/2011    Procedure: BYPASS GRAFT FEMORAL-POPLITEAL ARTERY;  Surgeon: Elam Dutch, MD;  Location: [facility] OR;  Service: Vascular;  Laterality: Left;  left femoral to popliteal bypass with composite vein and propaten 16mm x 80 graft  . 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 Was participating in PT prior to AKA. Pt presents today for physical therapy re-evaluation. Moving well but with deficits in mobility, balance  and activity tolerance. Will benefit physical therapy in the acute setting to address these and the following problem list as well as to maximize mobility, independence and strength for safety at home. Rec CIR for follow up therapies.  PT Recommendation/Assessment: Patient will  need skilled PT in the acute care venue PT Problem List: Decreased strength;Decreased activity tolerance;Decreased balance;Decreased mobility;Decreased knowledge of use of DME;Pain PT Therapy Diagnosis : Difficulty walking;Abnormality of gait;Generalized weakness;Acute pain PT Plan PT Frequency: Min 3X/week PT Treatment/Interventions: DME instruction;Gait training;Functional mobility training;Therapeutic activities;Therapeutic exercise;Balance training;Neuromuscular re-education;Patient/family education PT Recommendation Recommendations for Other Services: Rehab consult Follow Up Recommendations: Inpatient Rehab Equipment Recommended: Defer to next venue PT Goals  Acute Rehab PT Goals PT Goal Formulation: With patient Time For Goal Achievement: 2 weeks Pt will go Supine/Side to Sit: with modified independence PT Goal: Supine/Side to Sit - Progress: Goal set today Pt will go Sit to Supine/Side: with modified independence PT Goal: Sit to Supine/Side - Progress: Goal set today Pt will go Sit to Stand: with min assist PT Goal: Sit to Stand - Progress: Goal set today Pt will go Stand to Sit: with min assist PT Goal: Stand to Sit - Progress: Goal set today Pt will Transfer Bed to Chair/Chair to Bed: with min assist PT Transfer Goal: Bed to Chair/Chair to Bed - Progress: Goal set today Pt will Ambulate: 1 - 15 feet;with min assist PT Goal: Ambulate - Progress: Goal set today PT Goal: Up/Down Stairs - Progress: Discontinued (comment) Pt will Perform Home Exercise Program: Independently PT Goal: Perform Home Exercise Program - Progress: Goal set today  PT Evaluation Precautions/Restrictions  Precautions Precautions: Fall Restrictions Weight Bearing Restrictions: No Other Position/Activity Restrictions: NWB left residual limb Prior Functioning  Home Living Lives With: Spouse (plan to stay with daughter who can provide 24 hour care) Receives Help From: Family (very  supportive  daughter) Type of Home: House Home Layout: One level Home Access: Stairs to enter Entrance Stairs-Rails: Right;Left;Can reach both Entrance Stairs-Number of Steps: 3 Bathroom Shower/Tub: Chiropodist: Standard Home Adaptive Equipment: None Prior Function Level of Independence: Independent with basic ADLs;Independent with homemaking with ambulation;Independent with gait;Independent with transfers Driving: No Vocation: Retired Artist: Awake/alert Overall Cognitive Status: Appears within functional limits for tasks assessed Orientation Level: Oriented to person;Oriented to place;Oriented to situation;Disoriented to time Cognition - Other Comments: Pt unable to recall date (month, day or year) This was concerning to her as she normally does not have issues with this- may be med related? Sensation/Coordination Sensation Light Touch: Impaired Detail Light Touch Impaired Details: Impaired LLE (also reports "phantom pains") Coordination Gross Motor Movements are Fluid and Coordinated: Yes Fine Motor Movements are Fluid and Coordinated: Yes Extremity Assessment RUE Assessment RUE Assessment: Within Functional Limits LUE Assessment LUE Assessment: Within Functional Limits RLE Assessment RLE Assessment: Within Functional Limits LLE AROM (degrees) LLE Overall AROM Comments: L AKA; hip flexion appears WFL but not tested; pt able to actively extend hip but not tested (only tested in sitting) Mobility (including Balance) Bed Mobility Supine to Sit: With rails;HOB elevated (Comment degrees);1: +2 Total assist (HOB at ~30degrees. Pt=40%) Supine to Sit Details (indicate cue type and reason): assist to bring RLE to EOB, VC for hand placement, assist to bring trunk upright and use of pad to bring pt's hips to EOB Transfers Stand Pivot Transfers: 1: +2 Total assist;Patient percentage (comment) (60%) Stand Pivot Transfer Details (indicate cue type  and reason): sequencing cues, cues for upright posture; heel-toe pivot with RW bed->3in1 and 3in1->recliner all towards right side Ambulation/Gait Ambulation/Gait: No  Posture/Postural Control Posture/Postural Control: No significant limitations Static Sitting Balance Static Sitting - Balance Support: No upper extremity supported Static Sitting - Level of Assistance: 6: Modified independent (Device/Increase time) Static Standing Balance Static Standing - Balance Support: Bilateral upper extremity supported Static Standing - Level of Assistance: 1: +2 Total assist (60%) Static Standing - Comment/# of Minutes: pt with tendency to left lean needing facilitation for midline and upright posture Exercise  Other Exercises Other Exercises: armrest push ups bilUE x 5  End of Session PT - End of Session Equipment Utilized During Treatment: Gait belt Activity Tolerance: Patient tolerated treatment well Patient left: in chair;with call bell in reach;with family/visitor present Nurse Communication: Mobility status for transfers General Behavior During Session: [facility] for tasks performed Cognition: [facility] for tasks performed  [place] 06/13/2011, 12:42 PM

## 2011-06-13 NOTE — Progress Notes (Signed)
[facility] [place]  Daily Progress Note  Assessment/Planning: POD #1 s/p L AKA after failed fem- pop   Check L AKA stump tomorrow  Subjective  - 1 Day Post-Op  Pain controlled  Objective Filed Vitals:   06/12/11 1350 06/12/11 1714 06/12/11 2219 06/13/11 0500  BP: 136/66 133/66 139/59 144/72  Pulse: 75 72 80 83  Temp: 98 F (36.7 C)  98.8 F (37.1 C) 98.4 F (36.9 C)  TempSrc: Oral  Oral Oral  Resp: 18  18 19   Height:      Weight:    214 lb 8.1 oz (97.3 kg)  SpO2: 96%  95% 92%    Intake/Output Summary (Last 24 hours) at 06/13/11 0832 Last data filed at 06/12/11 1700  Gross per 24 hour  Intake   1340 ml  Output    150 ml  Net   1190 ml    PULM  CTAB CV  RRR GI  soft, NTND VASC  L AKA: bandaged  Laboratory CBC    Component Value Date/Time   WBC 14.4* 06/13/2011 0500   HGB 9.0* 06/13/2011 0500   HCT 27.3* 06/13/2011 0500   PLT 393 06/13/2011 0500    BMET    Component Value Date/Time   NA 135 06/13/2011 0500   K 4.2 06/13/2011 0500   CL 100 06/13/2011 0500   CO2 26 06/13/2011 0500   GLUCOSE 115* 06/13/2011 0500   BUN 24* 06/13/2011 0500   CREATININE 1.63* 06/13/2011 0500   CALCIUM 8.6 06/13/2011 0500   GFRNONAA 32* 06/13/2011 0500   GFRAA 37* 06/13/2011 0500    Adele Barthel, MD [facility] [place]: [phone] Pager: [phone]  06/13/2011, 8:32 AM

## 2011-06-13 NOTE — Progress Notes (Signed)
CSW met with pt and family to address consult for SNF. PT/OT is recommending CIR. CIR is currently following pt for possible admission. CSW explained role of social work. CSW will hold off on SNF search, as CIR is pursuing insurance approval. CSW will follow pt to assess discharge needs and advise.   Darden Dates, MSW, [place]

## 2011-06-14 DIAGNOSIS — N179 Acute kidney failure, unspecified: Secondary | ICD-10-CM

## 2011-06-14 LAB — BASIC METABOLIC PANEL
BUN: 31 mg/dL — ABNORMAL HIGH (ref 6–23)
CO2: 27 mEq/L (ref 19–32)
Calcium: 8.9 mg/dL (ref 8.4–10.5)
Chloride: 98 mEq/L (ref 96–112)
Creatinine, Ser: 1.76 mg/dL — ABNORMAL HIGH (ref 0.50–1.10)
GFR calc Af Amer: 34 mL/min — ABNORMAL LOW (ref >90)
GFR calc non Af Amer: 29 mL/min — ABNORMAL LOW (ref >90)
Glucose, Bld: 123 mg/dL — ABNORMAL HIGH (ref 70–99)
Potassium: 4.6 mEq/L (ref 3.5–5.1)
Sodium: 132 mEq/L — ABNORMAL LOW (ref 135–145)

## 2011-06-14 LAB — CBC
HCT: 26.1 % — ABNORMAL LOW (ref 36.0–46.0)
Hemoglobin: 8.5 g/dL — ABNORMAL LOW (ref 12.0–15.0)
MCH: 28.3 pg (ref 26.0–34.0)
MCHC: 32.6 g/dL (ref 30.0–36.0)
MCV: 87 fL (ref 78.0–100.0)
Platelets: 422 10*3/uL — ABNORMAL HIGH (ref 150–400)
RBC: 3 MIL/uL — ABNORMAL LOW (ref 3.87–5.11)
RDW: 16.4 % — ABNORMAL HIGH (ref 11.5–15.5)
WBC: 12.8 10*3/uL — ABNORMAL HIGH (ref 4.0–10.5)

## 2011-06-14 NOTE — PMR Pre-admission (Signed)
PMR Admission Coordinator Pre-Admission Assessment  Patient:  Stephanie Griffith is an 66 y.o., female MRN:  [id number] DOB:  [birth year] Height:  5\' 8"  (172.7 cm) Weight:  93.1 kg (205 lb 4 oz)  Insurance Information: HMO:    PPO:yes     PCP:     IPA:     80/20:     OTHER:medicare replacement policy   Yaak      [id number]      Subscriber:pt CM Name:Zelda Fleming      [id number]     Fax#:none/on site Pre-Cert#:[id number]      Employer:retired Benefits:  Phone #:[phone]     Name:Brittany 06/14/11 Eff. Date:04/03/11     Deduct:none      Out of Pocket Max:$3900      Life [id number] CIR:$220 per day days 1 to 7      SNF:$50 per day days 1 to 20, $150 per days 21 to 40, no copay after 41 Outpatient:$40 per visit     Co-Pay: Home Health: 80%     Co-Pay:20% DME:80%     Co-Pay:20% Providers:in network  SECONDARY:none        Current Medical History:   Patient Admitting Diagnosis:  Left aka  History of Present Illness: admitted 2/28 after recent toenail removal by podiatry. Admitted with dry gangrene and placed on IV antibiotics. Eventual L aka 3/12.  Patients Past Medical History:   Past Medical History  Diagnosis Date  . Hypertension   . Hernia   . Chronic renal insufficiency     Cr 1.38-1.93 in 2009   Family Medical History:  family history includes Heart attack in her brother and father. NIH Stroke scale:   Glascow Coma Scale:   Patients Current Diet: Carb Control  Prior Rehab/Hospitalizations:  Medications Current Medications: Current facility-administered medications:acetaminophen (TYLENOL) suppository 325-650 mg, 325-650 mg, Rectal, Q4H PRN, Lindell Spar, PA;  acetaminophen (TYLENOL) tablet 325-650 mg, 325-650 mg, Oral, Q4H PRN, Hulen Shouts [place], PA;  albuterol (PROVENTIL) (5 MG/ML) 0.5% nebulizer solution 2.5 mg, 2.5 mg, Nebulization, Q2H PRN, Doree Fudge, MD alum & mag hydroxide-simeth (MAALOX/MYLANTA) 200-200-20 MG/5ML suspension  15-30 mL, 15-30 mL, Oral, Q2H PRN, Elam Dutch, MD, 30 mL at 06/14/11 2350;  amLODipine (NORVASC) tablet 10 mg, 10 mg, Oral, Daily, Barton Dubois, MD, 10 mg at 06/14/11 1048;  aspirin EC tablet 81 mg, 81 mg, Oral, Daily, Jolaine Artist, MD, 81 mg at 06/14/11 1048;  atorvastatin (LIPITOR) tablet 40 mg, 40 mg, Oral, q1800, Jolaine Artist, MD, 40 mg at 06/14/11 1754 bisacodyl (DULCOLAX) EC tablet 5 mg, 5 mg, Oral, Daily PRN, Ulyses Amor, PA, 5 mg at 06/07/11 0909;  carvedilol (COREG) tablet 25 mg, 25 mg, Oral, BID WC, Lindell Spar, PA, 25 mg at 06/15/11 0758;  docusate sodium (COLACE) capsule 100 mg, 100 mg, Oral, Daily, Ulyses Amor, PA, 100 mg at 06/14/11 1048;  enoxaparin (LOVENOX) injection 40 mg, 40 mg, Subcutaneous, Q24H, Hulen Shouts [place], PA, 40 mg at 06/14/11 1136 famotidine (PEPCID) tablet 20 mg, 20 mg, Oral, Daily, Elam Dutch, MD, 20 mg at 06/14/11 1048;  feeding supplement (ENSURE CLINICAL STRENGTH) liquid 237 mL, 237 mL, Oral, TID WC, Sherre Scarlet, RD, 237 mL at 06/15/11 0758;  guaiFENesin-dextromethorphan (ROBITUSSIN DM) 100-10 MG/5ML syrup 15 mL, 15 mL, Oral, Q4H PRN, Ulyses Amor, PA hydrALAZINE (APRESOLINE) injection 10 mg, 10 mg, Intravenous, Q2H PRN, Ulyses Amor, PA, 10 mg at 06/11/11 0650;  ipratropium (ATROVENT) nebulizer solution 0.5  mg, 0.5 mg, Nebulization, Q2H PRN, Doree Fudge, MD;  labetalol (NORMODYNE,TRANDATE) injection 10 mg, 10 mg, Intravenous, Q2H PRN, Ulyses Amor, PA, 10 mg at 06/08/11 0110;  metoprolol (LOPRESSOR) injection 2-5 mg, 2-5 mg, Intravenous, Q2H PRN, Lindell Spar, PA morphine 2 MG/ML injection 2-5 mg, 2-5 mg, Intravenous, Q1H PRN, Ulyses Amor, PA, 4 mg at 06/12/11 2228;  ondansetron (ZOFRAN) injection 4 mg, 4 mg, Intravenous, Q6H PRN, Ulyses Amor, PA;  ondansetron [facility]) tablet 4 mg, 4 mg, Oral, Q6H PRN, Barton Dubois, MD;  oxyCODONE (Oxy IR/ROXICODONE) immediate release tablet 5-10 mg, 5-10 mg,  Oral, Q4H PRN, Ulyses Amor, PA, 10 mg at 06/15/11 0924 phenol (CHLORASEPTIC) mouth spray 1 spray, 1 spray, Mouth/Throat, PRN, Ulyses Amor, PA;  potassium chloride SA (K-DUR,KLOR-CON) CR tablet 20 mEq, 20 mEq, Oral, Daily, Jolaine Artist, MD, 20 mEq at 06/14/11 1048;  senna-docusate (Senokot-S) tablet 1 tablet, 1 tablet, Oral, QHS PRN, Ulyses Amor, PA;  DISCONTD: hydrochlorothiazide (HYDRODIURIL) tablet 25 mg, 25 mg, Oral, Daily, Jolaine Artist, MD, 25 mg at 06/14/11 1048  Precautions/Special Needs:    Additional Precautions/Restrictions: Precautions Precautions: Fall Required Braces or Orthoses: Yes Other Brace/Splint: [place] Restrictions Weight Bearing Restrictions: No Other Position/Activity Restrictions: NWB left residual limb  Therapy Assessments Prior Function: Level of Independence: Independent with basic ADLs;Independent with homemaking with ambulation;Independent with gait;Independent with transfers Driving: No Vocation: Retired Comments: Pt lives with spouse but plans to D/C to dgtr's apt with 3 steps who can provide 24hr care  Additional Prior Functional Levels:  Bed Mobility: independent with all adls Cooking/Meal Prep: self Housework: self Money Management: self Driving: no  Prior Activity Level: Limited Community (1-2x/wk): active  ADLs/Mobility:Current Functional Level ADL Eating/Feeding: Simulated;Independent Where Assessed - Eating/Feeding: Chair Grooming: Performed;Teeth care;Minimal assistance Grooming Details (indicate cue type and reason): pt required Min-Mod assist to maintain upright standing at sink while using bilateral UE to apply toothpaste to toothbrush. Min A-Min guard A with standing balance as pt brushed her teeth as she was able to use RUE to support herself Where Assessed - Grooming: Standing at sink Upper Body Bathing: Simulated;Set up Where Assessed - Upper Body Bathing: Sitting, chair Lower Body Bathing: Simulated;Maximal  assistance Where Assessed - Lower Body Bathing: Sit to stand from chair Upper Body Dressing: Simulated;Set up Where Assessed - Upper Body Dressing: Sitting, chair Lower Body Dressing: Simulated;+1 Total assistance Where Assessed - Lower Body Dressing: Sit to stand from chair Toilet Transfer: Simulated;Minimal assistance Toilet Transfer Details (indicate cue type and reason): Pt Min A-Min guard A with RW hopping. VC to extend elbows with hops to allow greater foot clearance. Pt quickly fatigues. Toilet Transfer Method: Counselling psychologist: Engineer, civil (consulting) Manipulation: Simulated;Minimal assistance Where Assessed - Public house manager - Hygiene: Performed;Minimal assistance Where Assessed - Toileting Hygiene: Standing Tub/Shower Transfer: Not assessed Equipment Used: Rolling walker Ambulation Related to ADLs: see toilet transfer  Bed Mobility Bed Mobility: No Supine to Sit: With rails;HOB elevated (Comment degrees);1: +2 Total assist (HOB at ~30degrees. Pt=40%) Supine to Sit Details (indicate cue type and reason): assist to bring RLE to EOB, VC for hand placement, assist to bring trunk upright and use of pad to bring pt's hips to EOB Sitting - Scoot to Edge of Bed: 6: Modified independent (Device/Increase time) Sitting - Scoot to Edge of Bed Details (indicate cue type and reason): increased time Transfers Transfers: Yes Sit to Stand: From chair/3-in-1;With armrests;4: Min assist Sit  to Stand Details (indicate cue type and reason): cueing for hand placement and safety x 2 trials Stand to Sit: 4: Min assist Stand to Sit Details: minguard with cues for hand placement Stand Pivot Transfers: 1: +2 Total assist;Patient percentage (comment) (60%) Stand Pivot Transfer Details (indicate cue type and reason): sequencing cues, cues for upright posture; heel-toe pivot with RW bed->3in1 and 3in1->recliner all towards right  side Ambulation/Gait Ambulation/Gait: Yes Ambulation/Gait Assistance: 4: Min assist Ambulation/Gait Assistance Details (indicate cue type and reason): minguard with VC throughout to extend trunk and hip and increase bil UE extension to assist with hopping. Dgtr asked to bring shoe for right foot Ambulation Distance (Feet): 12 Feet (12' then 2') Assistive device: Rolling walker Gait Pattern: Trunk flexed;Step-to pattern (hopping RLE) Gait velocity: slowly Stairs: No Wheelchair Mobility Wheelchair Mobility: No Posture/Postural Control Posture/Postural Control: No significant limitations Balance Balance Assessed: No Static Sitting Balance Static Sitting - Balance Support: No upper extremity supported Static Sitting - Level of Assistance: 6: Modified independent (Device/Increase time) Static Standing Balance Static Standing - Balance Support: Bilateral upper extremity supported Static Standing - Level of Assistance: 1: +2 Total assist (60%) Static Standing - Comment/# of Minutes: pt with tendency to left lean needing facilitation for midline and upright posture  Home Assistive Devices/Equipment:  Home Assistive Devices/Equipment Home Assistive Devices/Equipment: Eyeglasses  Discharge Planning:  Living Arrangements: Children Support Systems: Family members;Other (Comment) (daughters) Assistance Needed: with adls postop Do you have any problems obtaining your medications?: No Type of Residence: Private residence [place]: No Patient expects to be discharged to::  (daughter's home, Angelica Pou) Expected Discharge Date:  (ELOS 7 to 10 days) Case Management Consult Needed: Yes (Comment)  Previous Home Environment:  Living Arrangements: Children Support Systems: Family members;Other (Comment) (daughters) Assistance Needed: with adls postop Do you have any problems obtaining your medications?: No Type of Residence: Private residence [place]: No Patient  expects to be discharged to::  (daughter's home, [place]ou) Expected Discharge Date:  (ELOS 7 to 10 days) Home Environment Number of Levels: one level Previous Home Environment Number of Steps: 3 steps Previous Home Environment Is Bedroom on Main Floor?: Yes Previous Home Environment Is Bathroom on Main Floor?: Yes  Discharge Living Setting:  Plans for Discharge Living Setting: Lives with (comment) (daughter markent's home) Discharge Living Setting Number of Levels: two level Discharge Living Setting Number of Steps: none at entry Discharge Living Setting is [place] on [place]?: Yes Discharge Living Setting is [place] on [place]?: Yes  Address for Angelica Pou is [address], [place], [place] [id number] home number is [phone]  Social/Family/Support Systems:  Patient Roles: Parent;Other (Comment) (7 dtrs and 3 sons) Contact Information: Chanetta Spehar, dtr cell is [phone] Anticipated Caregiver: [name] to help with bathing and adls during day; markent works nights Anticipated Caregiver's Contact Information: [address] Pou [id number] Ability/Limitations of Caregiver: Mickie Kay works nights, Monique to assist with adls, multiple family members in and out Caregiver Availability: 24/7 Discharge Plan Discussed with Primary Caregiver: Yes Is Caregiver In Agreement with Plan?: Yes Does Caregiver/Family have Issues with Lodging/Transportation while Pt is in Rehab?: No  Goals/Additional Needs:  Patient/Family Goal for Rehab: supervision with PT, supervision to min assist with OT Additional Information: married for 49 years to [place]. he is alcoholic with cirrhosis. Patient plans not to return home with him. He is not aware Pt/Family Agrees to Admission and willing to participate: Yes Program Orientation Provided & Reviewed with Pt/Caregiver Including Roles  & Responsibilities:  Yes  Preadmission Screen Completed By:  Cleatrice Burke, 06/15/2011 10:40 AM  Patient's condition:   Please see physician update to information in consult dated 06/13/11.  Preadmission Screen Competed by:Danne Baxter, RN, Time/Date, [phone] on 06/03/13.  Discussed status with Dr.Telvin Reinders on 06/15/11 at 12 and received telephone approval for admission today.  Admission Coordinator:  Cleatrice Burke, time [id number] Date 06/15/11.

## 2011-06-14 NOTE — Progress Notes (Signed)
Orthopedic Tech Progress Note Patient Details:  Stephanie Griffith 1945-11-26 [id number] Fitted by Redgie Grayer Patient ID: [name], female   DOB: [birth year], 66 y.o.   MRN: [id number]  Braulio Bosch 06/14/2011, 7:30 PM

## 2011-06-14 NOTE — Progress Notes (Addendum)
[facility] [place]  Postoperative Visit - Amputation  Date of Surgery: 05/31/2011 - 06/12/2011 Procedure(s): AMPUTATION ABOVE KNEE Left Surgeon: Surgeon(s): Elam Dutch, MD POD: 2 Days Post-Op  Subjective Stephanie Griffith is a 66 y.o. female who is S/P Left Procedure(s): AMPUTATION ABOVE KNEE.  Pt.denies increased pain in the stump. The patient notes pain is well controlled. Pt. reports phantom pain.  Significant Diagnostic Studies: CBC Lab Results  Component Value Date   WBC 12.8* 06/14/2011   HGB 8.5* 06/14/2011   HCT 26.1* 06/14/2011   MCV 87.0 06/14/2011   PLT 422* 06/14/2011    BMET    Component Value Date/Time   NA 132* 06/14/2011 0629   K 4.6 06/14/2011 0629   CL 98 06/14/2011 0629   CO2 27 06/14/2011 0629   GLUCOSE 123* 06/14/2011 0629   BUN 31* 06/14/2011 0629   CREATININE 1.76* 06/14/2011 0629   CALCIUM 8.9 06/14/2011 0629   GFRNONAA 29* 06/14/2011 0629   GFRAA 34* 06/14/2011 0629    COAG Lab Results  Component Value Date   INR 1.13 06/04/2011   No results found for this basename: PTT     Intake/Output Summary (Last 24 hours) at 06/14/11 1021 Last data filed at 06/14/11 0900  Gross per 24 hour  Intake    960 ml  Output    200 ml  Net    760 ml   Patient Vitals for the past 24 hrs:  Urine Occurrence  06/13/11 1900 1   06/13/11 1700 1      Physical Examination  BP Readings from Last 3 Encounters:  06/14/11 149/72  06/14/11 149/72  06/14/11 149/72   Temp Readings from Last 3 Encounters:  06/14/11 98.6 F (37 C) Oral  06/14/11 98.6 F (37 C) Oral  06/14/11 98.6 F (37 C) Oral   SpO2 Readings from Last 3 Encounters:  06/14/11 98%  06/14/11 98%  06/14/11 98%   Pulse Readings from Last 3 Encounters:  06/14/11 78  06/14/11 78  06/14/11 78    Pt is A&Ox3  WDWN female with no complaints  Left amputation wound is healing well.  There is good bone coverage in the stump Stump is warm and well perfused, without drainage;  without erythema   Assessment/plan:  Stephanie Griffith is a 66 y.o. female who is s/p Left Procedure(s): AMPUTATION ABOVE KNEE  The patient's stump is viable  Can be transferred to rehab when bed available and insurance approves CIR.  Follow-up 4 weeks from surgery  Laurence Slate [facility] 10:21 AM 06/14/2011 [id number]  Addendum  I have independently interviewed and examined the patient, and I agree with the physician assistant's findings.    Adele Barthel, MD [facility] [place] Office: [phone] Pager: [phone]  06/14/2011, 7:09 PM

## 2011-06-14 NOTE — Progress Notes (Signed)
Orthopedic Tech Progress Note Patient Details:  Stephanie Griffith May 02, 1945 [id number]  Patient ID: Stephanie Griffith, female   DOB: 06/08/1945, 66 y.o.   MRN: [id number] Called bio-tech @ 10:50 with brace order  Hildred Priest 06/14/2011, 10:49 AM

## 2011-06-14 NOTE — Progress Notes (Signed)
I will begin pre certification for admit to inpatient acute rehab for tomorrow. Patient is in agreement. Please call with questions. Pager [phone]

## 2011-06-14 NOTE — Progress Notes (Signed)
Subjective:  Stable after amputation.  Telemetry reviewed.  No current complaints.   Objective:  Vital Signs in the last 24 hours: Temp:  [97.2 F (36.2 C)-98.6 F (37 C)] 97.2 F (36.2 C) (03/14 1357) Pulse Rate:  [67-78] 67  (03/14 1357) Resp:  [18-19] 19  (03/14 1357) BP: (120-149)/(70-73) 131/73 mmHg (03/14 1357) SpO2:  [98 %-99 %] 99 % (03/14 1357) Weight:  [208 lb 14.4 oz (94.756 kg)] 208 lb 14.4 oz (94.756 kg) (03/14 0512)  Intake/Output from previous day: 03/13 0701 - 03/14 0700 In: 840 [P.O.:840] Out: 400 [Urine:400]   Physical Exam: General: Well developed, well nourished, in no acute distress. Head:  Normocephalic and atraumatic. Lungs: Decrease BS bilaterally Heart: Normal S1 and S2.  No murmur, rubs or gallops.  Pulses: Extremities: AKA left.   Neurologic: Alert and oriented x 3.    Lab Results:  Basename 06/14/11 0629 06/13/11 0500  WBC 12.8* 14.4*  HGB 8.5* 9.0*  PLT 422* 393    Basename 06/14/11 0629 06/13/11 0500  NA 132* 135  K 4.6 4.2  CL 98 100  CO2 27 26  GLUCOSE 123* 115*  BUN 31* 24*  CREATININE 1.76* 1.63*   No results found for this basename: TROPONINI:2,CK,MB:2 in the last 72 hours Hepatic Function Panel No results found for this basename: PROT,ALBUMIN,AST,ALT,ALKPHOS,BILITOT,BILIDIR,IBILI in the last 72 hours No results found for this basename: CHOL in the last 72 hours No results found for this basename: PROTIME in the last 72 hours  Imaging: No results found.    Assessment/Plan:  1.  Atrial fib  -- maintaining NSR, continue on beta blockers. 2.  CKD---worse BUN/Cr even on  HCTZ---hold for one to two days.  3.  S/P AKA--stable 4.  Atelectasis  --incentive spirometry.      Bing Quarry, MD, [facility], [place] 06/14/2011, 3:13 PM

## 2011-06-14 NOTE — Progress Notes (Signed)
CSW met with pt and daughter. CSW provided Advanced Directive paperwork for pt's review. CSW explained paperwork. Pt shared that she would like to think about it. Pt will be admitted to CIR tomorrow, per CIR admissions coordinator. CSW is signing off as no further clinical social worker needs identified. Please reconsult if a need arises prior to discharge.   Stephanie Griffith, MSW, [place]

## 2011-06-15 ENCOUNTER — Inpatient Hospital Stay (HOSPITAL_COMMUNITY)
Admission: RE | Admit: 2011-06-15 | Discharge: 2011-06-26 | DRG: 945 | Disposition: A | Discharge status: Home / Self Care | Payer: Medicare Other | Source: Ambulatory Visit | Attending: Physical Medicine & Rehabilitation | Admitting: Physical Medicine & Rehabilitation

## 2011-06-15 ENCOUNTER — Encounter (HOSPITAL_COMMUNITY): Payer: Self-pay | Admitting: *Deleted

## 2011-06-15 DIAGNOSIS — N183 Chronic kidney disease, stage 3 unspecified: Secondary | ICD-10-CM | POA: Diagnosis present

## 2011-06-15 DIAGNOSIS — Z5189 Encounter for other specified aftercare: Principal | ICD-10-CM

## 2011-06-15 DIAGNOSIS — I4891 Unspecified atrial fibrillation: Secondary | ICD-10-CM | POA: Diagnosis present

## 2011-06-15 DIAGNOSIS — A0472 Enterocolitis due to Clostridium difficile, not specified as recurrent: Secondary | ICD-10-CM

## 2011-06-15 DIAGNOSIS — I70409 Unspecified atherosclerosis of autologous vein bypass graft(s) of the extremities, unspecified extremity: Secondary | ICD-10-CM | POA: Diagnosis present

## 2011-06-15 DIAGNOSIS — I129 Hypertensive chronic kidney disease with stage 1 through stage 4 chronic kidney disease, or unspecified chronic kidney disease: Secondary | ICD-10-CM | POA: Diagnosis present

## 2011-06-15 DIAGNOSIS — I5031 Acute diastolic (congestive) heart failure: Secondary | ICD-10-CM | POA: Diagnosis present

## 2011-06-15 DIAGNOSIS — Z4889 Encounter for other specified surgical aftercare: Secondary | ICD-10-CM

## 2011-06-15 DIAGNOSIS — K8020 Calculus of gallbladder without cholecystitis without obstruction: Secondary | ICD-10-CM | POA: Diagnosis present

## 2011-06-15 DIAGNOSIS — I70269 Atherosclerosis of native arteries of extremities with gangrene, unspecified extremity: Secondary | ICD-10-CM

## 2011-06-15 DIAGNOSIS — K439 Ventral hernia without obstruction or gangrene: Secondary | ICD-10-CM | POA: Diagnosis present

## 2011-06-15 DIAGNOSIS — S78119A Complete traumatic amputation at level between unspecified hip and knee, initial encounter: Secondary | ICD-10-CM

## 2011-06-15 DIAGNOSIS — E785 Hyperlipidemia, unspecified: Secondary | ICD-10-CM | POA: Diagnosis present

## 2011-06-15 DIAGNOSIS — N289 Disorder of kidney and ureter, unspecified: Secondary | ICD-10-CM

## 2011-06-15 DIAGNOSIS — Z89619 Acquired absence of unspecified leg above knee: Secondary | ICD-10-CM

## 2011-06-15 DIAGNOSIS — I509 Heart failure, unspecified: Secondary | ICD-10-CM | POA: Diagnosis present

## 2011-06-15 LAB — BASIC METABOLIC PANEL
BUN: 33 mg/dL — ABNORMAL HIGH (ref 6–23)
CO2: 26 mEq/L (ref 19–32)
Calcium: 8.8 mg/dL (ref 8.4–10.5)
Chloride: 97 mEq/L (ref 96–112)
Creatinine, Ser: 1.81 mg/dL — ABNORMAL HIGH (ref 0.50–1.10)
GFR calc Af Amer: 32 mL/min — ABNORMAL LOW (ref >90)
GFR calc non Af Amer: 28 mL/min — ABNORMAL LOW (ref >90)
Glucose, Bld: 120 mg/dL — ABNORMAL HIGH (ref 70–99)
Potassium: 4.5 mEq/L (ref 3.5–5.1)
Sodium: 131 mEq/L — ABNORMAL LOW (ref 135–145)

## 2011-06-15 LAB — CBC
HCT: 26.5 % — ABNORMAL LOW (ref 36.0–46.0)
Hemoglobin: 8.7 g/dL — ABNORMAL LOW (ref 12.0–15.0)
MCH: 29.1 pg (ref 26.0–34.0)
MCHC: 32.8 g/dL (ref 30.0–36.0)
MCV: 88.6 fL (ref 78.0–100.0)
Platelets: 497 10*3/uL — ABNORMAL HIGH (ref 150–400)
RBC: 2.99 MIL/uL — ABNORMAL LOW (ref 3.87–5.11)
RDW: 16.3 % — ABNORMAL HIGH (ref 11.5–15.5)
WBC: 11.8 10*3/uL — ABNORMAL HIGH (ref 4.0–10.5)

## 2011-06-15 MED ORDER — ASPIRIN EC 81 MG PO TBEC
81.0000 mg | DELAYED_RELEASE_TABLET | Freq: Every day | ORAL | Status: DC
  Administered 2011-06-16 – 2011-06-26 (×11): 81 mg via ORAL
  Filled 2011-06-15 (×14): qty 1

## 2011-06-15 MED ORDER — ENOXAPARIN SODIUM 40 MG/0.4ML ~~LOC~~ SOLN
40.0000 mg | SUBCUTANEOUS | Status: DC
  Administered 2011-06-16 – 2011-06-26 (×11): 40 mg via SUBCUTANEOUS
  Filled 2011-06-15 (×12): qty 0.4

## 2011-06-15 MED ORDER — GUAIFENESIN-DM 100-10 MG/5ML PO SYRP: 15.0000 mL | ORAL_SOLUTION | ORAL | Status: DC | PRN

## 2011-06-15 MED ORDER — FAMOTIDINE 20 MG PO TABS
20.0000 mg | ORAL_TABLET | Freq: Every day | ORAL | Status: DC
  Administered 2011-06-16 – 2011-06-26 (×11): 20 mg via ORAL
  Filled 2011-06-15 (×13): qty 1

## 2011-06-15 MED ORDER — AMLODIPINE BESYLATE 10 MG PO TABS
10.0000 mg | ORAL_TABLET | Freq: Every day | ORAL | Status: DC
  Administered 2011-06-16 – 2011-06-26 (×11): 10 mg via ORAL
  Filled 2011-06-15 (×14): qty 1

## 2011-06-15 MED ORDER — POLYETHYLENE GLYCOL 3350 17 G PO PACK
17.0000 g | PACK | Freq: Every day | ORAL | Status: DC | PRN
  Filled 2011-06-15: qty 1

## 2011-06-15 MED ORDER — ACETAMINOPHEN 325 MG PO TABS
325.0000 mg | ORAL_TABLET | ORAL | Status: DC | PRN
  Administered 2011-06-20 – 2011-06-21 (×2): 650 mg via ORAL
  Filled 2011-06-15 (×2): qty 2

## 2011-06-15 MED ORDER — ENSURE COMPLETE PO LIQD
237.0000 mL | Freq: Three times a day (TID) | ORAL | Status: DC
  Administered 2011-06-15 – 2011-06-21 (×9): 237 mL via ORAL
  Administered 2011-06-21: 09:00:00 via ORAL
  Administered 2011-06-22: 237 mL via ORAL
  Administered 2011-06-22 – 2011-06-25 (×4): via ORAL
  Administered 2011-06-26: 237 mL via ORAL

## 2011-06-15 MED ORDER — OXYCODONE HCL 5 MG PO TABS
5.0000 mg | ORAL_TABLET | ORAL | Status: DC | PRN
  Administered 2011-06-15 – 2011-06-19 (×9): 10 mg via ORAL
  Administered 2011-06-20: 5 mg via ORAL
  Administered 2011-06-20: 10 mg via ORAL
  Administered 2011-06-21: 5 mg via ORAL
  Administered 2011-06-21 – 2011-06-26 (×13): 10 mg via ORAL
  Filled 2011-06-15 (×14): qty 2
  Filled 2011-06-15: qty 1
  Filled 2011-06-15 (×2): qty 2
  Filled 2011-06-15: qty 1
  Filled 2011-06-15 (×4): qty 2
  Filled 2011-06-15: qty 1
  Filled 2011-06-15 (×4): qty 2

## 2011-06-15 MED ORDER — SORBITOL 70 % SOLN
30.0000 mL | Freq: Every day | Status: DC | PRN
  Administered 2011-06-19 – 2011-06-25 (×3): 30 mL via ORAL
  Filled 2011-06-15 (×3): qty 30

## 2011-06-15 MED ORDER — ONDANSETRON HCL 4 MG/2ML IJ SOLN: 4.0000 mg | Freq: Four times a day (QID) | INTRAMUSCULAR | Status: DC | PRN

## 2011-06-15 MED ORDER — SENNA 8.6 MG PO TABS
1.0000 | ORAL_TABLET | Freq: Two times a day (BID) | ORAL | Status: DC
  Filled 2011-06-15 (×2): qty 1

## 2011-06-15 MED ORDER — ATORVASTATIN CALCIUM 40 MG PO TABS
40.0000 mg | ORAL_TABLET | Freq: Every day | ORAL | Status: DC
  Administered 2011-06-15 – 2011-06-25 (×11): 40 mg via ORAL
  Filled 2011-06-15 (×12): qty 1

## 2011-06-15 MED ORDER — CARVEDILOL 25 MG PO TABS
25.0000 mg | ORAL_TABLET | Freq: Two times a day (BID) | ORAL | Status: DC
  Administered 2011-06-15 – 2011-06-26 (×22): 25 mg via ORAL
  Filled 2011-06-15 (×24): qty 1

## 2011-06-15 NOTE — Progress Notes (Signed)
[facility] [place]  Postoperative Visit - Amputation  Date of Surgery: 05/31/2011 - 06/12/2011 Procedure(s): AMPUTATION ABOVE KNEE Left Surgeon: Surgeon(s): Elam Dutch, MD POD: 3 Days Post-Op  Subjective Stephanie Griffith is a 66 y.o. female who is S/P Left Procedure(s): AMPUTATION ABOVE KNEE.  Pt.denies increased pain in the stump. The patient notes pain is well controlled. Pt. denies phantom pain.  Significant Diagnostic Studies: CBC Lab Results  Component Value Date   WBC 11.8* 06/15/2011   HGB 8.7* 06/15/2011   HCT 26.5* 06/15/2011   MCV 88.6 06/15/2011   PLT 497* 06/15/2011    BMET    Component Value Date/Time   NA 131* 06/15/2011 0430   K 4.5 06/15/2011 0430   CL 97 06/15/2011 0430   CO2 26 06/15/2011 0430   GLUCOSE 120* 06/15/2011 0430   BUN 33* 06/15/2011 0430   CREATININE 1.81* 06/15/2011 0430   CALCIUM 8.8 06/15/2011 0430   GFRNONAA 28* 06/15/2011 0430   GFRAA 32* 06/15/2011 0430    COAG Lab Results  Component Value Date   INR 1.13 06/04/2011   No results found for this basename: PTT     Intake/Output Summary (Last 24 hours) at 06/15/11 1004 Last data filed at 06/15/11 0900  Gross per 24 hour  Intake    600 ml  Output   1225 ml  Net   -625 ml   No data found.    Physical Examination  BP Readings from Last 3 Encounters:  06/15/11 144/74  06/15/11 144/74  06/15/11 144/74   Temp Readings from Last 3 Encounters:  06/15/11 97.2 F (36.2 C) Oral  06/15/11 97.2 F (36.2 C) Oral  06/15/11 97.2 F (36.2 C) Oral   SpO2 Readings from Last 3 Encounters:  06/15/11 96%  06/15/11 96%  06/15/11 96%   Pulse Readings from Last 3 Encounters:  06/15/11 69  06/15/11 69  06/15/11 69    Pt is A&Ox3  WDWN female with no complaints  Left amputation wound is clean, dry and healing well.  There is good  bone coverage in the stump Stump is warm and well perfused, without drainage; without erythema Left groin wound is non erythematous and  no active drainage.  It is a moist dark area.  Keep clean and dry-4x4 daily.  Assessment/plan:  Stephanie Griffith is a 66 y.o. female who is s/p Left Procedure(s): AMPUTATION ABOVE KNEE  The patient's stump is clean, dry, intact or viable.  Follow-up 4 weeks from surgery  Laurence Slate MAUREEN 10:05 AM 06/15/2011 [id number]

## 2011-06-15 NOTE — Progress Notes (Signed)
Physical Therapy Treatment Patient Details Name: Stephanie Griffith MRN: [id number] DOB: [birth year] Today's Date: 06/15/2011  PT Assessment/Plan  PT - Assessment/Plan Comments on Treatment Session: Pt s/p LAKA progressing well and able to hop better today improved from even before AKA. Pt educated for sequence of transfers and gait and encouraged to continue mobilizing with nursing assist and to continue to perform hip extension and Add. Pt improving and dgtr present throughout. PT Plan: Discharge plan remains appropriate Follow Up Recommendations: Inpatient Rehab PT Goals  Acute Rehab PT Goals Pt will go Sit to Stand: with supervision PT Goal: Sit to Stand - Progress: Updated due to goal met Pt will go Stand to Sit: with supervision PT Goal: Stand to Sit - Progress: Updated due to goals met Pt will Ambulate: 51 - 150 feet;with supervision PT Goal: Ambulate - Progress: Updated due to goal met PT Goal: Up/Down Stairs - Progress: Progressing toward goal PT Goal: Perform Home Exercise Program - Progress: Progressing toward goal  PT Treatment Precautions/Restrictions  Precautions Precautions: Fall Required Braces or Orthoses: Yes Other Brace/Splint:  Restrictions Weight Bearing Restrictions: No Other Position/Activity Restrictions: NWB left residual limb Mobility (including Balance) Bed Mobility Bed Mobility: No Transfers Sit to Stand: From chair/3-in-1;With armrests;4: Min assist Sit to Stand Details (indicate cue type and reason): cueing for hand placement and safety x 2 trials Stand to Sit: 4: Min assist Stand to Sit Details: minguard with cues for hand placement Ambulation/Gait Ambulation/Gait: Yes Ambulation/Gait Assistance: 4: Min assist Ambulation/Gait Assistance Details (indicate cue type and reason): minguard with VC throughout to extend trunk and hip and increase bil UE extension to assist with hopping. Dgtr asked to bring shoe for right foot Ambulation Distance (Feet): 12 Feet  (12' then 2') Assistive device: Rolling walker Gait Pattern: Trunk flexed;Step-to pattern (hopping RLE) Stairs: No    Exercise  Other Exercises Other Exercises: standing hip extension x 10 A/ROM Other Exercises: arm rest push ups 5x bilUE A/ROM End of Session PT - End of Session Equipment Utilized During Treatment: Gait belt Activity Tolerance: Patient tolerated treatment well Patient left: in chair;with call bell in reach;with family/visitor present General Behavior During Session: [facility] for tasks performed Cognition: [facility] for tasks performed  Melford Aase 06/15/2011, 9:14 AM Elwyn Reach, [place]

## 2011-06-15 NOTE — Discharge Summary (Signed)
[facility] Discharge Summary   Patient ID:  Stephanie Griffith MRN: [id number] DOB/AGE: [birth year] 66 y.o.  Admit date: 05/31/2011 Discharge date: 06/15/2011 Date of Surgery: 05/31/2011 - 06/12/2011 Surgeon: Juliann Mule): Stephanie Dutch, MD  Admission Diagnosis: Abdominal pain [789.0] Dry gangrene [785.4] Necrosis of toe [709.8] ABD PAIN\ FOOT PAIN pvd PVD Osteomy  Discharge Diagnoses:  Abdominal pain [789.0] Dry gangrene [785.4] Necrosis of toe [709.8] ABD PAIN\ FOOT PAIN pvd PVD Osteomy  Secondary Diagnoses: Past Medical History  Diagnosis Date  . Hypertension   . Hernia   . Chronic renal insufficiency     Cr 1.38-1.93 in 2009    Procedure(s): AMPUTATION ABOVE KNEE  Discharged Condition: good  HPI: with a past medical history significant for hypertension, chronic kidney disease stage III, abdominal hernia (no incarcerated), recent left big toe infection (status post toenail removal by podiatry and antibiotic use as an outpatient); came into the hospital complaining of abdominal pain, nausea/vomiting and decreased intake for the 24-40 hours prior to admission. The patient denied any fever but she endorses having associated chills with her symptoms. Patient reports approximately 6-7 episodes of vomiting and was having difficulty keeping things down, at this moment she came to the emergency department for further evaluation and treatment. In the ED patient was found with a leukocytosis (in the 17,000 range), creatinine 2.97 with a GFR of 17, elevated lipase, hyponatremia and also left first toe with dry gangrene and gas on x-ray. Of note she denied any chest pain, hematemesis, shortness of breath, melena/hematochezia, any cough or any other acute complaints. Patient also reports having a normal bowel movement on the day of admission. Patient is currently not compliant with any of her medications for the last 3 months; and is not followed by any PCP at this time.        Hospital Course:  Stephanie Griffith is a 66 y.o. female is S/P Left AKA  Procedure(s):Gangrene left first toe secondary to peripheral vascular disease.  Left fem pop with first toe amp. 06-06-11.  Bypass is occluded 06-11-11.  AKA performed 06-12-11.  She will be D/C to in patient [facility].     AMPUTATION ABOVE KNEE Extubated: POD # 0 Post-op wounds clean, dry, intact or healing well Pt. Ambulating, voiding and taking PO diet without difficulty. Pt pain controlled with PO pain meds. Labs as below Complications:none  Consults:  Treatment Team:  Stephanie Dutch, MD [facility], MD Conrad Earling, MD  Significant Diagnostic Studies: CBC Lab Results  Component Value Date   WBC 11.8* 06/15/2011   HGB 8.7* 06/15/2011   HCT 26.5* 06/15/2011   MCV 88.6 06/15/2011   PLT 497* 06/15/2011    BMET    Component Value Date/Time   NA 131* 06/15/2011 0430   K 4.5 06/15/2011 0430   CL 97 06/15/2011 0430   CO2 26 06/15/2011 0430   GLUCOSE 120* 06/15/2011 0430   BUN 33* 06/15/2011 0430   CREATININE 1.81* 06/15/2011 0430   CALCIUM 8.8 06/15/2011 0430   GFRNONAA 28* 06/15/2011 0430   GFRAA 32* 06/15/2011 0430   COAG Lab Results  Component Value Date   INR 1.13 06/04/2011     Disposition:  Discharge to :Rehab   [place] [place]  Home Medication Instructions [id number]   Printed on:06/15/11 1008  Medication Information                    aspirin 81 MG EC tablet Take 81 mg by mouth  daily.             hydrochlorothiazide 25 MG tablet Take 25 mg by mouth daily.             metoprolol (LOPRESSOR) 25 MG tablet Take 25 mg by mouth 2 (two) times daily.              Verbal and written Discharge instructions given to the patient. Wound care per Discharge AVS F/U with Dr. Oneida Alar 4 weeks from surgery.  SignedRoxy Horseman 06/15/2011, 10:08 AM

## 2011-06-15 NOTE — Progress Notes (Signed)
Report called to [facility]. Pt transferred to [id number]. Nurse at bedside. Marko Plume

## 2011-06-15 NOTE — Progress Notes (Signed)
Insurance has approved and patient to be admitted to CIR today. Please call with any questions. Pager [phone]

## 2011-06-15 NOTE — Progress Notes (Signed)
Occupational Therapy Treatment Patient Details Name: Stephanie Griffith MRN: [id number] DOB: 09/22/1945 Today's Date: 06/15/2011  OT Assessment/Plan OT Assessment/Plan Comments on Treatment Session: Pt making excellent progress and is a great CIR candidate OT Plan: Discharge plan remains appropriate Follow Up Recommendations: Inpatient Rehab Equipment Recommended: Defer to next venue OT Goals ADL Goals ADL Goal: Grooming - Progress: Progressing toward goals ADL Goal: Toilet Transfer - Progress: Progressing toward goals ADL Goal: Toileting - Clothing Manipulation - Progress: Progressing toward goals ADL Goal: Additional Goal #1 - Progress: Progressing toward goals  OT Treatment Precautions/Restrictions  Precautions Precautions: Fall Restrictions Weight Bearing Restrictions: No Other Position/Activity Restrictions: NWB left residual limb   ADL ADL Grooming: Performed;Teeth care;Minimal assistance Grooming Details (indicate cue type and reason): pt required Min-Mod assist to maintain upright standing at sink while using bilateral UE to apply toothpaste to toothbrush. Min A-Min guard A with standing balance as pt brushed her teeth as she was able to use RUE to support herself Where Assessed - Grooming: Standing at sink Toilet Transfer: Simulated;Minimal assistance Toilet Transfer Details (indicate cue type and reason): Pt Min A-Min guard A with RW hopping. VC to extend elbows with hops to allow greater foot clearance. Pt quickly fatigues. Toilet Transfer Method: Ambulating Toileting - Clothing Manipulation: Simulated;Minimal assistance Where Assessed - Toileting Clothing Manipulation: Standing Ambulation Related to ADLs: see toilet transfer Mobility  Bed Mobility Bed Mobility: No Transfers Sit to Stand: From chair/3-in-1;With armrests;4: Min assist Sit to Stand Details (indicate cue type and reason): cueing for hand placement and safety x 2 trials Stand to Sit: 4: Min assist Stand to  Sit Details: minguard with cues for hand placement Exercises Other Exercises Other Exercises: standing hip extension x 10 A/ROM Other Exercises: arm rest push ups 10x bilUE A/ROM  End of Session OT - End of Session Equipment Utilized During Treatment: Gait belt Activity Tolerance: Patient tolerated treatment well Patient left: in chair;with call bell in reach;with family/visitor present General Behavior During Session: [facility] for tasks performed Cognition: [facility] for tasks performed  Clotile Whittington  06/15/2011, 9:18 AM

## 2011-06-15 NOTE — H&P (Signed)
Physical Medicine and Rehabilitation Admission H&P  Nishi Bassette is an 66 y.o. female.   Chief Complaint  Patient presents with  . Abdominal Pain  . Hernia  : HPI: 66 year old right-handed female with history of hypertension chronic kidney disease stage III and baseline creatinine of 1.9 as well as recent left big toe infection with toenail removal by podiatry and antibiotic use as an outpatient. Admitted February 28 with nausea as well abdominal pain and white blood cell count of 17,000 and creatinine 2.97. Patient with dry gangrene of left lower extremity superimposed cellulitis and placed on intravenous antibiotics. Lower extremity arterial Doppler's performed which revealed ABI 0.17 in the left anterior tibial artery and 1.0 in the right lower extremity. Angiography of left lower extremity March 4 showed moderate stenosis in the proximal superficial femoral artery. There was moderate disease at the adductor canal which occludes with reconstitution  above the knee popliteal artery. Underwent right femoral to below knee popliteal bypass March 6 by Dr. 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 Patient was extubated in the operating room but noted to have labored shallow breathing in PACU. Chest x-ray revealed right greater than left air spaced disease. Critical care medicine was consulted placed on BiPAP for a short time. Antibiotics for questionable aspiration pneumonia were added. Cardiac enzymes were obtained showing mild elevations in CK but negative MB. Seen by cardiology services for new onset atrial fibrillation with RVR heart rate 160s to 180s. She denied any chest pain palpitations or dizziness. She converted to normal sinus rhythm at heart rates remain slightly elevated 90s to 100s. Dr. Burt Knack of cardiology services consulted and felt atrial fibrillation related to acute stress and illness.. Echocardiogram completed showing ejection fraction of 65% and grade 1 diastolic dysfunction.  Patient received intravenous metoprolol for a short time. Advised to avoid digoxin given inconsistent renal dysfunction. It was advised to continue aspirin therapy. There was some question of possible fluid overload by chest x-ray as weights had slightly increased. Patient was placed for a short time on intravenous Lasix 40 mg twice daily with close monitoring of renal function. In regards to her initial abdominal pain and nausea a CT of the abdomen and pelvis was completed showing multiple gallstones and some thickening of the gallbladder wall as well as ventral hernia. General surgery Dr. Excell Seltzer was consulted and noted planned for laparoscopic cholecystectomy and repair of ventral hernia once patient medically stable from  vascular issues. 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 Oneida Alar. Postoperative pain management. Maintained on subcutaneous lovenox for DVT prophylaxis. M.D. has requested physical medicine rehabilitation consult  Review of Systems  Gastrointestinal: Positive for nausea and vomiting.  Musculoskeletal: Positive for joint pain.  All other systems reviewed and are negative   Past Medical History  Diagnosis Date  . Hypertension   . Hernia   . Chronic renal insufficiency     Cr 1.38-1.93 in 2009   Past Surgical History  Procedure Date  . Tubal ligation   . Femoral-popliteal bypass graft 06/06/2011    Procedure: BYPASS GRAFT FEMORAL-POPLITEAL ARTERY;  Surgeon: Elam Dutch, MD;  Location[facility] OR;  Service: Vascular;  Laterality: Left;  left femoral to popliteal bypass with composite vein and propaten 75mm x 80 graft  . Amputation 06/06/2011    Procedure: AMPUTATION DIGIT;  Surgeon: Elam Dutch, MD;  Location: [facility] OR;  Service: Vascular;  Laterality: Left;  Transmetatarsal amputation  left great toe   Family History  Problem Relation Age of Onset  . Heart attack Father   . Heart attack  Brother    Social History:  reports that she has been smoking.  She has never used smokeless tobacco. She reports that she does not drink alcohol or use illicit drugs. Allergies: No Known Allergies Medications Prior to Admission  Medication Dose Route Frequency Provider Last Rate Last Dose  . 0.9 %  sodium chloride infusion   Intravenous STAT Abran Richard, [place]      . 0.9 %  sodium chloride infusion   Intravenous Continuous Barton Dubois, MD 150 mL/hr at 06/01/11 0106 1,000 mL at 06/01/11 0106  . 0.9 %  sodium chloride infusion  500 mL Intravenous Once PRN Ulyses Amor, PA      . acetaminophen (TYLENOL) tablet 325-650 mg  325-650 mg Oral Q4H PRN Lindell Spar, PA       Or  . acetaminophen (TYLENOL) suppository 325-650 mg  325-650 mg Rectal Q4H PRN Lindell Spar, PA      . albuterol (PROVENTIL) (5 MG/ML) 0.5% nebulizer solution 2.5 mg  2.5 mg Nebulization Once Roberts Gaudy, MD   2.5 mg at 06/06/11 1415  . ipratropium (ATROVENT) nebulizer solution 0.5 mg  0.5 mg Nebulization Q2H PRN Doree Fudge, MD       And  . albuterol (PROVENTIL) (5 MG/ML) 0.5% nebulizer solution 2.5 mg  2.5 mg Nebulization Q2H PRN Doree Fudge, MD      . alum & mag hydroxide-simeth (MAALOX/MYLANTA) 200-200-20 MG/5ML suspension 15-30 mL  15-30 mL Oral Q2H PRN Elam Dutch, MD   30 mL at 06/14/11 2350  . amLODipine (NORVASC) tablet 10 mg  10 mg Oral Daily Barton Dubois, MD   10 mg at 06/14/11 1048  . Ampicillin-Sulbactam (UNASYN) 3 g in sodium chloride 0.9 % 100 mL IVPB  3 g Intravenous Q6H Elam Dutch, MD   3 g at 06/08/11 0831  . aspirin EC tablet 81 mg  81 mg Oral Daily Jolaine Artist, MD   81 mg at 06/14/11 1048  . atorvastatin (LIPITOR) tablet 40 mg  40 mg Oral q1800 Jolaine Artist, MD   40 mg at 06/14/11 1754  . bisacodyl (DULCOLAX) EC tablet 5 mg  5 mg Oral Daily PRN Ulyses Amor, PA   5 mg at 06/07/11 0909  . carvedilol (COREG) tablet 25 mg  25 mg Oral BID  WC Lindell Spar, PA   25 mg at 06/14/11 1641  . ceFAZolin (ANCEF) IVPB 2 g/50 mL premix  2 g Intravenous On Call Elam Dutch, MD   2 g at 06/12/11 1135  . cefUROXime (ZINACEF) 1.5 g in dextrose 5 % 50 mL IVPB  1.5 g Intravenous Q12H Lindell Spar, PA   1.5 g at 06/13/11 1101  . digoxin (LANOXIN) 0.25 MG/ML injection 0.5 mg  0.5 mg Intravenous Once Angelia Mould, MD   0.5 mg at 06/08/11 0646  . docusate sodium (COLACE) capsule 100 mg  100 mg Oral Daily Ulyses Amor, PA   100 mg at 06/14/11 1048  . enoxaparin (LOVENOX) injection 40 mg  40 mg Subcutaneous Q24H Lindell Spar, PA   40 mg at 06/14/11 1136  . famotidine (PEPCID) tablet 20 mg  20 mg Oral Daily Elam Dutch, MD   20 mg at 06/14/11 1048  . feeding supplement (ENSURE CLINICAL STRENGTH) liquid 237 mL  237 mL Oral  TID WC Sherre Scarlet, RD   237 mL at 06/14/11 1641  . fentaNYL (SUBLIMAZE) 0.05 MG/ML injection           . furosemide (LASIX) injection 10 mg  10 mg Intravenous Once Pearletha Furl, MD   10 mg at 06/06/11 1433  . guaiFENesin-dextromethorphan (ROBITUSSIN DM) 100-10 MG/5ML syrup 15 mL  15 mL Oral Q4H PRN Ulyses Amor, PA      . heparin 2-0.9 UNIT/ML-% infusion           . hydrALAZINE (APRESOLINE) injection 10 mg  10 mg Intravenous Q2H PRN Ulyses Amor, PA   10 mg at 06/11/11 0650  . labetalol (NORMODYNE,TRANDATE) 5 MG/ML injection           . labetalol (NORMODYNE,TRANDATE) injection 10 mg  10 mg Intravenous Q2H PRN Ulyses Amor, PA   10 mg at 06/08/11 0110  . lidocaine (XYLOCAINE) 1 % injection           . magnesium sulfate IVPB 2 g 50 mL  2 g Intravenous Once PRN Ulyses Amor, PA      . metoprolol (LOPRESSOR) injection 2-5 mg  2-5 mg Intravenous Q2H PRN Ulyses Amor, PA   5 mg at 06/08/11 1026  . metoprolol (LOPRESSOR) injection 2-5 mg  2-5 mg Intravenous Q2H PRN Lindell Spar, PA      . midazolam (VERSED) 2 MG/2ML injection           . morphine 2 MG/ML injection  2-5 mg  2-5 mg Intravenous Q1H PRN Ulyses Amor, PA   4 mg at 06/12/11 2228  . morphine 2 MG/ML injection           . morphine 4 MG/ML injection 4 mg  4 mg Intravenous Once Abran Richard, PA   4 mg at 05/31/11 1102  . ondansetron (ZOFRAN) injection 4 mg  4 mg Intravenous Once Abran Richard, PA   4 mg at 05/31/11 1101  . ondansetron (ZOFRAN) injection 4 mg  4 mg Intravenous Q6H PRN Ulyses Amor, PA      . ondansetron [facility]) tablet 4 mg  4 mg Oral Q6H PRN Barton Dubois, MD      . oxyCODONE (Oxy IR/ROXICODONE) immediate release tablet 5-10 mg  5-10 mg Oral Q4H PRN Ulyses Amor, PA   10 mg at 06/14/11 2311  . pantoprazole (PROTONIX) injection 40 mg  40 mg Intravenous Once Abran Richard, PA   40 mg at 05/31/11 1500  . phenol (CHLORASEPTIC) mouth spray 1 spray  1 spray Mouth/Throat PRN Ulyses Amor, PA      . piperacillin-tazobactam (ZOSYN) IVPB 3.375 g  3.375 g Intravenous Once Randa Spike, PHARMD   3.375 g at 05/31/11 1759  . pneumococcal 23 valent vaccine (PNU-IMMUNE) injection 0.5 mL  0.5 mL Intramuscular Tomorrow-1000 Barton Dubois, MD   0.5 mL at 06/01/11 0926  . potassium chloride SA (K-DUR,KLOR-CON) CR tablet 20 mEq  20 mEq Oral Daily Jolaine Artist, MD   20 mEq at 06/14/11 1048  . potassium chloride SA (K-DUR,KLOR-CON) CR tablet 20-40 mEq  20-40 mEq Oral Once PRN Ulyses Amor, PA      . potassium chloride SA (K-DUR,KLOR-CON) CR tablet 20-40 mEq  20-40 mEq Oral Once PRN Lindell Spar, PA      . senna-docusate (Senokot-S) tablet 1 tablet  1 tablet Oral QHS PRN Ulyses Amor, PA      . sodium chloride 0.9 %  bolus 1,000 mL  1,000 mL Intravenous Once Abran Richard, PA   1,000 mL at 05/31/11 1102  . sodium chloride 0.9 % bolus 1,000 mL  1,000 mL Intravenous Once Abran Richard, PA   1,000 mL at 05/31/11 1227  . vancomycin (VANCOCIN) 750 mg in sodium chloride 0.9 % 150 mL IVPB  750 mg Intravenous Q24H Ulyses Amor, PA   750 mg at 06/06/11 2041  .  vancomycin (VANCOCIN) IVPB 1000 mg/200 mL premix  1,000 mg Intravenous Once Randa Spike, PHARMD   1,000 mg at 05/31/11 1828  . DISCONTD: 0.9 %  sodium chloride infusion   Intravenous Once Abran Richard, [place]      . DISCONTD: 0.9 %  sodium chloride infusion   Intravenous Continuous Reyne Dumas, MD 50 mL/hr at 06/05/11 0830    . DISCONTD: 0.9 %  sodium chloride infusion   Intravenous Continuous Ulyses Amor, PA 100 mL/hr at 06/07/11 0800    . DISCONTD: 0.9 %  sodium chloride infusion   Intravenous Continuous Sydnee Levans, MD 50 mL/hr at 06/12/11 1019    . DISCONTD: 0.9 % irrigation (POUR BTL)    PRN Elam Dutch, MD   2,000 mL at 06/06/11 0940  . DISCONTD: 0.9 % irrigation (POUR BTL)    PRN Elam Dutch, MD   1,000 mL at 06/12/11 1157  . DISCONTD: acetaminophen (TYLENOL) suppository 325-650 mg  325-650 mg Rectal Q4H PRN Ulyses Amor, PA      . DISCONTD: acetaminophen (TYLENOL) suppository 650 mg  650 mg Rectal Q6H PRN Barton Dubois, MD      . DISCONTD: acetaminophen (TYLENOL) tablet 325-650 mg  325-650 mg Oral Q4H PRN Ulyses Amor, PA      . DISCONTD: acetaminophen (TYLENOL) tablet 650 mg  650 mg Oral Q6H PRN Barton Dubois, MD      . DISCONTD: acetaminophen (TYLENOL) tablet 650 mg  650 mg Oral Q4H PRN Angelia Mould, MD      . DISCONTD: acetylcysteine (MUCOMYST) NICU oral/rectal solution 10%  1 mL/kg Oral BID [name], [place]      . DISCONTD: albuterol (PROVENTIL) (5 MG/ML) 0.5% nebulizer solution 2.5 mg  2.5 mg Nebulization Q4H Doree Fudge, MD   2.5 mg at 06/07/11 0445  . DISCONTD: ALPRAZolam Duanne Moron) tablet 1 mg  1 mg Oral QHS PRN Elam Dutch, MD      . DISCONTD: aspirin EC tablet 325 mg  325 mg Oral Daily Angelia Mould, MD   325 mg at 06/05/11 1011  . DISCONTD: aspirin EC tablet 81 mg  81 mg Oral Daily Barton Dubois, MD      . DISCONTD: aspirin EC tablet 81 mg  81 mg Oral Daily Barton Dubois, MD   81 mg at 06/03/11 0909  . DISCONTD:  aspirin tablet 162.5 mg  162.5 mg Oral Daily Jolaine Artist, MD      . DISCONTD: aspirin tablet 325 mg  325 mg Oral Daily Elam Dutch, MD   325 mg at 06/09/11 1127  . DISCONTD: carvedilol (COREG) tablet 12.5 mg  12.5 mg Oral BID WC Jolaine Artist, MD   12.5 mg at 06/11/11 1635  . DISCONTD: carvedilol (COREG) tablet 25 mg  25 mg Oral BID WC Fay Records, MD   25 mg at 06/12/11 0630  . DISCONTD: cefUROXime (ZINACEF) 1.5 g in dextrose 5 % 50 mL IVPB  1.5 g Intravenous Q12H Ulyses Amor, PA      .  DISCONTD: digoxin (LANOXIN) 0.25 MG/ML injection 0.125 mg  0.125 mg Intravenous Q4H Angelia Mould, MD   0.125 mg at 06/08/11 1100  . DISCONTD: digoxin (LANOXIN) tablet 0.125 mg  0.125 mg Oral Daily Angelia Mould, MD      . DISCONTD: DOPamine (INTROPIN) 800 mg in dextrose 5 % 250 mL infusion  3-5 mcg/kg/min Intravenous Continuous Ulyses Amor, PA      . DISCONTD: enoxaparin (LOVENOX) injection 100 mg  100 mg Subcutaneous Q12H Dimple Nanas, PHARMD   100 mg at 06/08/11 1439  . DISCONTD: enoxaparin (LOVENOX) injection 30 mg  30 mg Subcutaneous Q24H Barton Dubois, MD   30 mg at 06/01/11 2141  . DISCONTD: enoxaparin (LOVENOX) injection 40 mg  40 mg Subcutaneous Q24H Bonnielee Haff, MD   40 mg at 06/02/11 2133  . DISCONTD: enoxaparin (LOVENOX) injection 40 mg  40 mg Subcutaneous QHS Angelia Mould, MD   40 mg at 06/05/11 2221  . DISCONTD: enoxaparin (LOVENOX) injection 40 mg  40 mg Subcutaneous Q24H Lavonia Drafts [place]ill[place], PHARMD   40 mg at 06/11/11 0953  . DISCONTD: enoxaparin (LOVENOX) injection 50 mg  50 mg Subcutaneous Q24H Thuy Dien Dang, PHARMD   50 mg at 06/09/11 1127  . DISCONTD: famotidine (PEPCID) IVPB 20 mg  20 mg Intravenous Q12H Ulyses Amor, PA   20 mg at 06/07/11 2104  . DISCONTD: famotidine (PEPCID) IVPB 20 mg  20 mg Intravenous Q12H Lindell Spar, PA      . DISCONTD: fentaNYL (SUBLIMAZE) injection 50-100 mcg  50-100 mcg Intravenous PRN Sydnee Levans, MD      . DISCONTD: furosemide (LASIX) injection 20 mg  20 mg Intravenous Once Lindell Spar, PA      . DISCONTD: furosemide (LASIX) injection 40 mg  40 mg Intravenous BID Dayna N Dunn, PA   40 mg at 06/09/11 0805  . DISCONTD: furosemide (LASIX) tablet 40 mg  40 mg Oral Daily Jolaine Artist, MD      . DISCONTD: heparin 6,000 Units in sodium chloride irrigation 0.9 % 500 mL irrigation    PRN Elam Dutch, MD      . DISCONTD: hydrALAZINE (APRESOLINE) injection 10 mg  10 mg Intravenous Q6H PRN Barton Dubois, MD   10 mg at 06/04/11 0957  . DISCONTD: hydrochlorothiazide (HYDRODIURIL) tablet 25 mg  25 mg Oral Daily Jolaine Artist, MD   25 mg at 06/14/11 1048  . DISCONTD: HYDROmorphone (DILAUDID) injection 0.25-0.5 mg  0.25-0.5 mg Intravenous Q5 min PRN Roberts Gaudy, MD      . DISCONTD: HYDROmorphone (DILAUDID) injection 0.25-0.5 mg  0.25-0.5 mg Intravenous Q5 min PRN W. Oren Bracket, MD      . DISCONTD: ipratropium (ATROVENT) nebulizer solution 0.5 mg  0.5 mg Nebulization Q4H Doree Fudge, MD   0.5 mg at 06/07/11 0445  . DISCONTD: magnesium sulfate IVPB 2 g 50 mL  2 g Intravenous Once PRN Lindell Spar, PA      . DISCONTD: metoprolol (LOPRESSOR) tablet 50 mg  50 mg Oral BID Jolaine Artist, MD   50 mg at 06/09/11 2119  . DISCONTD: metoprolol tartrate (LOPRESSOR) tablet 25 mg  25 mg Oral BID Lindell Spar, PA   25 mg at 06/08/11 1252  . DISCONTD: metoprolol tartrate (LOPRESSOR) tablet 25 mg  25 mg Oral Q6H Dayna N Dunn, PA   25 mg at 06/09/11 0609  . DISCONTD: midazolam (VERSED) injection 1-2 mg  1-2 mg  Intravenous PRN Sydnee Levans, MD      . DISCONTD: morphine 2 MG/ML injection 2 mg  2 mg Intravenous Q3H PRN Barton Dubois, MD   2 mg at 06/06/11 0537  . DISCONTD: nicotine (NICODERM CQ - dosed in mg/24 hours) patch 14 mg  14 mg Transdermal Daily Barton Dubois, MD   14 mg at 06/05/11 1013  . DISCONTD: ondansetron (ZOFRAN) injection 4 mg  4 mg  Intravenous Q6H PRN Barton Dubois, MD   4 mg at 06/05/11 2217  . DISCONTD: ondansetron (ZOFRAN) injection 4 mg  4 mg Intravenous Q6H PRN Angelia Mould, MD      . DISCONTD: ondansetron [facility]) injection 4 mg  4 mg Intravenous Once PRN Roberts Gaudy, MD      . DISCONTD: oxyCODONE (Oxy IR/ROXICODONE) immediate release tablet 5 mg  5 mg Oral Q4H PRN Barton Dubois, MD   5 mg at 06/05/11 0940  . DISCONTD: oxyCODONE-acetaminophen (PERCOCET) 5-325 MG per tablet 1-2 tablet  1-2 tablet Oral Q3H PRN Angelia Mould, MD   2 tablet at 06/06/11 0235  . DISCONTD: pantoprazole (PROTONIX) EC tablet 40 mg  40 mg Oral Q1200 Barton Dubois, MD   40 mg at 06/04/11 1137  . DISCONTD: piperacillin-tazobactam (ZOSYN) IVPB 3.375 g  3.375 g Intravenous Q8H Christine E Shade, PHARMD   3.375 g at 06/06/11 0537  . DISCONTD: piperacillin-tazobactam (ZOSYN) IVPB 3.375 g  3.375 g Intravenous Q8H Ulyses Amor, PA   3.375 g at 06/07/11 0534  . DISCONTD: polyethylene glycol (MIRALAX / GLYCOLAX) packet 17 g  17 g Oral Daily PRN Barton Dubois, MD      . DISCONTD: sodium bicarbonate 50 mEq in dextrose 5 % 1,000 mL infusion   Intravenous Continuous Bonnielee Haff, MD 100 mL/hr at 06/02/11 1359    . DISCONTD: sodium bicarbonate 50 mEq in sodium chloride 0.45 % 1,000 mL infusion   Intravenous Continuous Reyne Dumas, MD 75 mL/hr at 06/04/11 2235    . DISCONTD: Surgifoam 100 with Thrombin 20,000 units (20 ml) topical solution    PRN Elam Dutch, MD      . DISCONTD: vancomycin (VANCOCIN) 750 mg in sodium chloride 0.9 % 150 mL IVPB  750 mg Intravenous Q24H Randa Spike, PHARMD   750 mg at 06/02/11 2133  . DISCONTD: vancomycin (VANCOCIN) 750 mg in sodium chloride 0.9 % 150 mL IVPB  750 mg Intravenous Q12H Thuy Dien Dang, PHARMD      . DISCONTD: vancomycin (VANCOCIN) 750 mg in sodium chloride 0.9 % 150 mL IVPB  750 mg Intravenous Q12H Reyne Dumas, MD   750 mg at 06/05/11 1742  . DISCONTD: vancomycin (VANCOCIN) 750 mg in  sodium chloride 0.9 % 150 mL IVPB  750 mg Intravenous Q24H Reyne Dumas, MD       Medications Prior to Admission  Medication Sig Dispense Refill  . aspirin 81 MG EC tablet Take 81 mg by mouth daily.        . hydrochlorothiazide 25 MG tablet Take 25 mg by mouth daily.        . metoprolol (LOPRESSOR) 25 MG tablet Take 25 mg by mouth 2 (two) times daily.          Home: Home Living Lives With: Spouse (plan to stay with daughter who can provide 24 hour care) Receives Help From: Family (very supportive daughter) Type of Home: House Home Layout: One level Home Access: Stairs to enter Entrance Stairs-Rails: Right;Left;Can reach both Entrance Stairs-Number of  Steps: 3 Bathroom Shower/Tub: Chiropodist: Standard Home Adaptive Equipment: None   Functional History: Prior Function Level of Independence: Independent with basic ADLs;Independent with homemaking with ambulation;Independent with gait;Independent with transfers Driving: No Vocation: Retired Comments: Pt lives with spouse but plans to D/C to dgtr's apt with 3 steps who can provide 24hr care  Functional Status:  Mobility: Bed Mobility Bed Mobility: Yes Supine to Sit: With rails;HOB elevated (Comment degrees);1: +2 Total assist (HOB at ~30degrees. Pt=40%) Supine to Sit Details (indicate cue type and reason): assist to bring RLE to EOB, VC for hand placement, assist to bring trunk upright and use of pad to bring pt's hips to EOB Sitting - Scoot to Edge of Bed: 6: Modified independent (Device/Increase time) Sitting - Scoot to Edge of Bed Details (indicate cue type and reason): increased time Transfers Transfers: Yes Sit to Stand: 4: Min assist;From bed Sit to Stand Details (indicate cue type and reason): cues for hand placement, RLE position and sequence Stand to Sit: 5: Supervision Stand to Sit Details: cues for hand placement, safety and control of descent Stand Pivot Transfers: 1: +2 Total assist;Patient  percentage (comment) (60%) Stand Pivot Transfer Details (indicate cue type and reason): sequencing cues, cues for upright posture; heel-toe pivot with RW bed->3in1 and 3in1->recliner all towards right side Ambulation/Gait Ambulation/Gait: No Ambulation/Gait Assistance: 4: Min assist Ambulation/Gait Assistance Details (indicate cue type and reason): cues for sequence, pt maintaining trunk flexion and elbow flexion despite max cueing for upright trunk and bilUE use to assist with stepping Ambulation Distance (Feet): 16 Feet Assistive device: Rolling walker Gait Pattern: Step-to pattern;Decreased step length - right;Decreased step length - left;Trunk flexed Gait velocity: slowly Stairs: No Wheelchair Mobility Wheelchair Mobility: No  ADL: ADL Eating/Feeding: Simulated;Independent Where Assessed - Eating/Feeding: Chair Grooming: Simulated;Wash/dry face;Set up Where Assessed - Grooming: Sitting, chair Upper Body Bathing: Simulated;Set up Where Assessed - Upper Body Bathing: Sitting, chair Lower Body Bathing: Simulated;Maximal assistance Where Assessed - Lower Body Bathing: Sit to stand from chair Upper Body Dressing: Simulated;Set up Where Assessed - Upper Body Dressing: Sitting, chair Lower Body Dressing: Simulated;+1 Total assistance Where Assessed - Lower Body Dressing: Sit to stand from chair Toilet Transfer: Performed;+2 Total assistance (pt=60%) Toilet Transfer Details (indicate cue type and reason): stand with pivotal steps with RLE to bedside commode. pt unable to fully unweight RLE (as with hopping)  Armed forces technical officer Method: Arts development officer: Pharmacist, community - Clothing Manipulation: Performed;+1 Total assistance Where Assessed - Camera operator Manipulation: Standing Toileting - Hygiene: Performed;Minimal assistance Where Assessed - Toileting Hygiene: Standing Tub/Shower Transfer: Not assessed Equipment Used: Rolling walker Ambulation Related  to ADLs: see toilet transfer  Cognition: Cognition Arousal/Alertness: Awake/alert Orientation Level: Oriented X4 Cognition Arousal/Alertness: Awake/alert Overall Cognitive Status: Appears within functional limits for tasks assessed Orientation Level: Oriented X4 Cognition - Other Comments: Pt unable to recall date (month, day or year) This was concerning to her as she normally does not have issues with this- may be med related?   Blood pressure 144/74, pulse 69, temperature 97.2 F (36.2 C), temperature source Oral, resp. rate 18, height 5\' 8"  (1.727 m), weight 93.1 kg (205 lb 4 oz), SpO2 96.00%. Physical Exam  Vitals reviewed.  Constitutional: She is oriented to person, place, and time. She appears well-developed.  HENT:  Head: Normocephalic. PERRL, EOMI Neck: Normal range of motion. Neck supple. No thyromegaly present.  Cardiovascular: Normal rate and rhythm without MRG Pulmonary/Chest: Breath sounds normal. She has no wheezes,  rales or rhonchi.  Abdominal: She exhibits no distension. There is no tenderness. She has a large umbilical hernia about the size of a softball Neurological: She is alert and oriented to person, place, and time. Strength 5/5 UE's and RLE.  LLE 2/5 HF. No sensory deficits seen. DTR's are 1+.  Cognitively quite appropriate. CN's intact Skin:  Incision clean and dry with dressing in place along AK.  Left thigh incision clean with staples and inguinal area a little moist but intact.  RLE with some callouses on foot but intact.  onchomychotic nails  Psychiatric: She has a normal mood and affect   Results for orders placed during the hospital encounter of 05/31/11 (from the past 48 hour(s))  BASIC METABOLIC PANEL     Status: Abnormal   Collection Time   06/14/11  6:29 AM      Component Value Range Comment   Sodium 132 (*) 135 - 145 (mEq/L)    Potassium 4.6  3.5 - 5.1 (mEq/L)    Chloride 98  96 - 112 (mEq/L)    CO2 27  19 - 32 (mEq/L)    Glucose, Bld 123 (*)  70 - 99 (mg/dL)    BUN 31 (*) 6 - 23 (mg/dL)    Creatinine, Ser 1.76 (*) 0.50 - 1.10 (mg/dL)    Calcium 8.9  8.4 - 10.5 (mg/dL)    GFR calc non Af Amer 29 (*) >90 (mL/min)    GFR calc Af Amer 34 (*) >90 (mL/min)   CBC     Status: Abnormal   Collection Time   06/14/11  6:29 AM      Component Value Range Comment   WBC 12.8 (*) 4.0 - 10.5 (K/uL)    RBC 3.00 (*) 3.87 - 5.11 (MIL/uL)    Hemoglobin 8.5 (*) 12.0 - 15.0 (g/dL)    HCT 26.1 (*) 36.0 - 46.0 (%)    MCV 87.0  78.0 - 100.0 (fL)    MCH 28.3  26.0 - 34.0 (pg)    MCHC 32.6  30.0 - 36.0 (g/dL)    RDW 16.4 (*) 11.5 - 15.5 (%)    Platelets 422 (*) 150 - 400 (K/uL)   CBC     Status: Abnormal   Collection Time   06/15/11  4:30 AM      Component Value Range Comment   WBC 11.8 (*) 4.0 - 10.5 (K/uL)    RBC 2.99 (*) 3.87 - 5.11 (MIL/uL)    Hemoglobin 8.7 (*) 12.0 - 15.0 (g/dL)    HCT 26.5 (*) 36.0 - 46.0 (%)    MCV 88.6  78.0 - 100.0 (fL)    MCH 29.1  26.0 - 34.0 (pg)    MCHC 32.8  30.0 - 36.0 (g/dL)    RDW 16.3 (*) 11.5 - 15.5 (%)    Platelets 497 (*) 150 - 400 (K/uL)   BASIC METABOLIC PANEL     Status: Abnormal   Collection Time   06/15/11  4:30 AM      Component Value Range Comment   Sodium 131 (*) 135 - 145 (mEq/L)    Potassium 4.5  3.5 - 5.1 (mEq/L)    Chloride 97  96 - 112 (mEq/L)    CO2 26  19 - 32 (mEq/L)    Glucose, Bld 120 (*) 70 - 99 (mg/dL)    BUN 33 (*) 6 - 23 (mg/dL)    Creatinine, Ser 1.81 (*) 0.50 - 1.10 (mg/dL)    Calcium 8.8  8.4 -  10.5 (mg/dL)    GFR calc non Af Amer 28 (*) >90 (mL/min)    GFR calc Af Amer 32 (*) >90 (mL/min)    No results found.  Post Admission Physician Evaluation: 1. 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 2. Patient is admitted to receive collaborative, interdisciplinary care between the physiatrist, rehab nursing staff, and therapy team. 3. Patient's level of medical complexity and substantial therapy needs in context of that medical necessity cannot be provided at  a lesser intensity of care such as a SNF. 4. Patient has experienced substantial functional loss from his/her baseline which was documented above under the "Functional History" and "Functional Status" headings.  Judging by the patient's diagnosis, physical exam, and functional history, the patient has potential for functional progress which will result in measurable gains while on inpatient rehab.  These gains will be of substantial and practical use upon discharge  in facilitating mobility and self-care at the household level. 5. Physiatrist will provide 24 hour management of medical needs as well as oversight of the therapy plan/treatment and provide guidance as appropriate regarding the interaction of the two. 6. 24 hour rehab nursing will assist with bladder management, bowel management, safety, skin/wound care, disease management, medication administration, pain management and patient education  and help integrate therapy concepts, techniques,education, etc. 7. PT will assess and treat for:  LES, ROM, fxnl mobility, pre-pros ed, safety.  Goals are: mod I w/c and supervision to min assist with walker for short dx. 8. OT will assess and treat for: UES, ROM, ADL's, safey, fxnl mobility.   Goals are: mod I at w/c level. 9. SLP will assess and treat for: not app 10. Case Management and Social Worker will assess and treat for psychological issues and discharge planning. 11. Team conference will be held weekly to assess progress toward goals and to determine barriers to discharge. 12.  Patient will receive at least 3 hours of therapy per day at least 5 days per week. 13. ELOS and Prognosis: 1 week excellent   Medical Problem List and Plan: 1. 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 DVT Prophylaxis/Anticoagulation: Subcutaneous Lovenox. Monitor platelet counts and any signs of bleeding 3.  Pain Management: Oxycodone as needed. Monitor mental status. 4. Postoperative atrial fibrillation. Patient converted to normal sinus rhythm. Followup cardiology services 5. Congestive heart failure. Weigh patient daily and monitor for any signs of fluid overload. 6. Chronic renal insufficiency. Baseline creatinine 1.9. Strict I&O. HCTZ 25 mg daily that patient was on prior to admission held 06/14/2011 due to increasing renal function. Consider resuming back in a couple of days if renal function is stable per cardiology services 7. Hypertension. Norvasc 10 mg daily, Coreg 25 mg twice daily 8. Hyperlipidemia. Lipitor 9. Multiple gallstones/cholelithiasis/ventral hernia. Plan followup per Gen. surgery once patient stabilized from vascular issues to consider laparoscopic cholecystectomy and hernia repair  -pt feels current bowel regimen too aggressive.  Will back off meds a bit. 10. Wound care-groin incisions intact. Watch incision along inguinal fold which is a bit moist  -continue allevyn dressing to AK incision  Oval Linsey, MD  06/15/11  12:50

## 2011-06-15 NOTE — Progress Notes (Signed)
[facility]   Daily Progress Note  Assessment/Planning: POD #3 s/p L AKA after failed fem-pop   CIR placement possibly today  Staples in groin out on 06/16/11  Subjective  - 3 Days Post-Op  No complaints  Objective Filed Vitals:   06/14/11 0512 06/14/11 1357 06/14/11 2055 06/15/11 0436  BP: 149/72 131/73 109/48 144/74  Pulse: 78 67 65 69  Temp: 98.6 F (37 C) 97.2 F (36.2 C) 98.2 F (36.8 C) 97.2 F (36.2 C)  TempSrc: Oral Oral Oral Oral  Resp: 18 19 16 18   Height:      Weight: 208 lb 14.4 oz (94.756 kg)   205 lb 4 oz (93.1 kg)  SpO2: 98% 99% 99% 96%    Intake/Output Summary (Last 24 hours) at 06/15/11 0825 Last data filed at 06/15/11 0435  Gross per 24 hour  Intake    720 ml  Output    925 ml  Net   -205 ml    PULM  CTAB CV  RRR GI  soft, NTND VASC  L AKA stump bandaged, staples in right groin still  Laboratory CBC    Component Value Date/Time   WBC 11.8* 06/15/2011 0430   HGB 8.7* 06/15/2011 0430   HCT 26.5* 06/15/2011 0430   PLT 497* 06/15/2011 0430    BMET    Component Value Date/Time   NA 131* 06/15/2011 0430   K 4.5 06/15/2011 0430   CL 97 06/15/2011 0430   CO2 26 06/15/2011 0430   GLUCOSE 120* 06/15/2011 0430   BUN 33* 06/15/2011 0430   CREATININE 1.81* 06/15/2011 0430   CALCIUM 8.8 06/15/2011 0430   GFRNONAA 28* 06/15/2011 0430   GFRAA 32* 06/15/2011 0430    Adele Barthel, MD [facility] [place] Office: [phone] Pager: [phone]  06/15/2011, 8:25 AM

## 2011-06-15 NOTE — Plan of Care (Signed)
Overall Plan of Care [facility]) Patient Details Name: Stephanie Griffith MRN: [id number] DOB: Jul 27, 1945  Diagnosis:  Left AKA  Primary Diagnosis:    S/P AKA (above knee amputation) Co-morbidities: C diff diarrhea, chronic renal insufficiency, hypertension  Functional Problem List  Patient demonstrates impairments in the following areas: Balance, Endurance, Motor, Pain, Safety and Sensory   Basic ADL's: grooming, bathing, dressing and toileting Advanced ADL's: simple meal preparation, laundry and light housekeeping  Transfers:  bed to chair, toilet, tub/shower and furniture Locomotion:  wheelchair mobility  Additional Impairments:  None  Anticipated Outcomes Item Anticipated Outcome  Eating/Swallowing  independent  Basic self-care  supervision  Tolieting  supervision  Bowel/Bladder  Modified Independent  Transfers  S/Mod-I  Locomotion  S/Mod-I  Communication    Cognition    Pain  <3  Safety/Judgment      Other     Therapy Plan: PT Frequency: 1-2 X/day, 60-90 minutes OT Frequency: 1-2 X/day, 60-90 minutes     Team Interventions: Item RN PT OT SLP SW TR Other  Self Care/Advanced ADL Retraining   x      Neuromuscular Re-Education   x      Therapeutic Activities  x x      UE/LE Strength Training/ROM  x x      UE/LE Coordination Activities  x       Visual/Perceptual Remediation/Compensation         DME/Adaptive Equipment Instruction  x x      Therapeutic Exercise  x x      Balance/Vestibular Training  x x      Patient/Family Education  x x      Cognitive Remediation/Compensation         Functional Mobility Training  x x      Ambulation/Gait Training  x       Engineer, agricultural Propulsion/Positioning  x       Functional Archivist         Bladder Management X        Bowel Management X        Disease  Management/Prevention X        Pain Management X x x      Medication Management X        Skin Care/Wound Management X x x      Splinting/Orthotics         Discharge Planning X        Psychosocial Support                            Team Discharge Planning: Destination:  Home Projected Follow-up:  Nursing, PT, OT and Home Health Projected Equipment Needs:  Elevated Programme researcher, broadcasting/film/video, Sliding Board and Wheelchair Patient/family involved in discharge planning:  Yes  MD ELOS: 2wks Medical Rehab Prognosis:  Excellent Assessment: pt admitted for CIR therapies.  Goals will be set at modified Independent to supervision primarily at a wheelchair level.  Course complicated by C Diff diarrhea.  Pt will be provided pre-prosthetic education. Daughter involved in care and can assist at home. Pt quite motivated.

## 2011-06-16 DIAGNOSIS — I70269 Atherosclerosis of native arteries of extremities with gangrene, unspecified extremity: Secondary | ICD-10-CM

## 2011-06-16 DIAGNOSIS — A0472 Enterocolitis due to Clostridium difficile, not specified as recurrent: Secondary | ICD-10-CM

## 2011-06-16 DIAGNOSIS — N289 Disorder of kidney and ureter, unspecified: Secondary | ICD-10-CM

## 2011-06-16 DIAGNOSIS — Z5189 Encounter for other specified aftercare: Secondary | ICD-10-CM

## 2011-06-16 DIAGNOSIS — S78119A Complete traumatic amputation at level between unspecified hip and knee, initial encounter: Secondary | ICD-10-CM

## 2011-06-16 LAB — CLOSTRIDIUM DIFFICILE BY PCR: Toxigenic C. Difficile by PCR: POSITIVE — AB

## 2011-06-16 MED ORDER — NICOTINE 21 MG/24HR TD PT24
21.0000 mg | MEDICATED_PATCH | Freq: Every day | TRANSDERMAL | Status: DC
  Administered 2011-06-16 – 2011-06-25 (×10): 21 mg via TRANSDERMAL
  Filled 2011-06-16 (×12): qty 1

## 2011-06-16 MED ORDER — ALUM & MAG HYDROXIDE-SIMETH 200-200-20 MG/5ML PO SUSP
30.0000 mL | Freq: Four times a day (QID) | ORAL | Status: DC | PRN
  Administered 2011-06-18 – 2011-06-23 (×7): 30 mL via ORAL
  Filled 2011-06-16 (×7): qty 30

## 2011-06-16 MED ORDER — METRONIDAZOLE 500 MG PO TABS
500.0000 mg | ORAL_TABLET | Freq: Three times a day (TID) | ORAL | Status: DC
  Administered 2011-06-16 – 2011-06-26 (×30): 500 mg via ORAL
  Filled 2011-06-16 (×34): qty 1

## 2011-06-16 NOTE — H&P (Signed)
Physical Medicine and Rehabilitation Admission H&P   Stephanie Griffith is an 66 y.o. female.    Chief Complaint   Patient presents with   .  Abdominal Pain   .  Hernia    : HPI: 66 year old right-handed female with history of hypertension chronic kidney disease stage III and baseline creatinine of 1.9 as well as recent left big toe infection with toenail removal by podiatry and antibiotic use as an outpatient. Admitted February 28 with nausea as well abdominal pain and white blood cell count of 17,000 and creatinine 2.97. Patient with dry gangrene of left lower extremity superimposed cellulitis and placed on intravenous antibiotics. Lower extremity arterial Doppler's performed which revealed ABI 0.17 in the left anterior tibial artery and 1.0 in the right lower extremity. Angiography of left lower extremity March 4 showed moderate stenosis in the proximal superficial femoral artery. There was moderate disease at the adductor canal which occludes with reconstitution  above the knee popliteal artery. Underwent right femoral to below knee popliteal bypass March 6 by Dr. 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 Patient was extubated in the operating room but noted to have labored shallow breathing in PACU. Chest x-ray revealed right greater than left air spaced disease. Critical care medicine was consulted placed on BiPAP for a short time. Antibiotics for questionable aspiration pneumonia were added. Cardiac enzymes were obtained showing mild elevations in CK but negative MB. Seen by cardiology services for new onset atrial fibrillation with RVR heart rate 160s to 180s. She denied any chest pain palpitations or dizziness. She converted to normal sinus rhythm at heart rates remain slightly elevated 90s to 100s. Dr. Burt Knack of cardiology services consulted and felt atrial fibrillation related to acute stress and illness.. Echocardiogram completed showing ejection fraction of 65% and grade 1 diastolic  dysfunction. Patient received intravenous metoprolol for a short time. Advised to avoid digoxin given inconsistent renal dysfunction. It was advised to continue aspirin therapy. There was some question of possible fluid overload by chest x-ray as weights had slightly increased. Patient was placed for a short time on intravenous Lasix 40 mg twice daily with close monitoring of renal function. In regards to her initial abdominal pain and nausea a CT of the abdomen and pelvis was completed showing multiple gallstones and some thickening of the gallbladder wall as well as ventral hernia. General surgery Dr. Excell Seltzer was consulted and noted planned for laparoscopic cholecystectomy and repair of ventral hernia once patient medically stable from  vascular issues. 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 Oneida Alar. Postoperative pain management. Maintained on subcutaneous lovenox for DVT prophylaxis. M.D. has requested physical medicine rehabilitation consult   Review of Systems   Gastrointestinal: Positive for nausea and vomiting.   Musculoskeletal: Positive for joint pain.   All other systems reviewed and are negative      Past Medical History   Diagnosis  Date   .  Hypertension     .  Hernia     .  Chronic renal insufficiency         Cr 1.38-1.93 in 2009    Past Surgical History   Procedure  Date   .  Tubal ligation     .  Femoral-popliteal bypass graft  06/06/2011       Procedure: BYPASS GRAFT FEMORAL-POPLITEAL ARTERY;  Surgeon: Elam Dutch, MD;  Location: [place];  Service: Vascular;  Laterality: Left;  left  femoral to popliteal bypass with composite vein and propaten 19mm x 80 graft   .  Amputation  06/06/2011       Procedure: AMPUTATION DIGIT;  Surgeon: Elam Dutch, MD;  Location: [facility] OR;  Service: Vascular;  Laterality: Left;  Transmetatarsal amputation left great toe    Family History   Problem  Relation   Age of Onset   .  Heart attack  Father     .  Heart attack  Brother      Social History: reports that she has been smoking.  She has never used smokeless tobacco. She reports that she does not drink alcohol or use illicit drugs. Allergies: No Known Allergies Medications Prior to Admission   Medication  Dose  Route  Frequency  Provider  Last Rate  Last Dose   .  0.9 %  sodium chloride infusion     Intravenous  STAT  Abran Richard, [place]         .  0.9 %  sodium chloride infusion     Intravenous  Continuous  Barton Dubois, MD  150 mL/hr at 06/01/11 0106  1,000 mL at 06/01/11 0106   .  0.9 %  sodium chloride infusion   500 mL  Intravenous  Once PRN  Ulyses Amor, PA         .  acetaminophen (TYLENOL) tablet 325-650 mg   325-650 mg  Oral  Q4H PRN  Lindell Spar, PA           Or   .  acetaminophen (TYLENOL) suppository 325-650 mg   325-650 mg  Rectal  Q4H PRN  Lindell Spar, PA         .  albuterol (PROVENTIL) (5 MG/ML) 0.5% nebulizer solution 2.5 mg   2.5 mg  Nebulization  Once  Roberts Gaudy, MD     2.5 mg at 06/06/11 1415   .  ipratropium (ATROVENT) nebulizer solution 0.5 mg   0.5 mg  Nebulization  Q2H PRN  Doree Fudge, MD           And   .  albuterol (PROVENTIL) (5 MG/ML) 0.5% nebulizer solution 2.5 mg   2.5 mg  Nebulization  Q2H PRN  Doree Fudge, MD         .  alum & mag hydroxide-simeth (MAALOX/MYLANTA) 200-200-20 MG/5ML suspension 15-30 mL   15-30 mL  Oral  Q2H PRN  Elam Dutch, MD     30 mL at 06/14/11 2350   .  amLODipine (NORVASC) tablet 10 mg   10 mg  Oral  Daily  Barton Dubois, MD     10 mg at 06/14/11 1048   .  Ampicillin-Sulbactam (UNASYN) 3 g in sodium chloride 0.9 % 100 mL IVPB   3 g  Intravenous  Q6H  Elam Dutch, MD     3 g at 06/08/11 0831   .  aspirin EC tablet 81 mg   81 mg  Oral  Daily  Jolaine Artist, MD     81 mg at 06/14/11 1048   .  atorvastatin (LIPITOR) tablet 40 mg   40 mg  Oral  q1800  Jolaine Artist, MD      40 mg at 06/14/11 1754   .  bisacodyl (DULCOLAX) EC tablet 5 mg   5 mg  Oral  Daily PRN  Ulyses Amor, PA     5 mg at 06/07/11 0909   .  carvedilol (  COREG) tablet 25 mg   25 mg  Oral  BID WC  Lindell Spar, PA     25 mg at 06/14/11 1641   .  ceFAZolin (ANCEF) IVPB 2 g/50 mL premix   2 g  Intravenous  On Call  Elam Dutch, MD     2 g at 06/12/11 1135   .  cefUROXime (ZINACEF) 1.5 g in dextrose 5 % 50 mL IVPB   1.5 g  Intravenous  Q12H  Lindell Spar, PA     1.5 g at 06/13/11 1101   .  digoxin (LANOXIN) 0.25 MG/ML injection 0.5 mg   0.5 mg  Intravenous  Once  Angelia Mould, MD     0.5 mg at 06/08/11 0646   .  docusate sodium (COLACE) capsule 100 mg   100 mg  Oral  Daily  Ulyses Amor, PA     100 mg at 06/14/11 1048   .  enoxaparin (LOVENOX) injection 40 mg   40 mg  Subcutaneous  Q24H  Lindell Spar, PA     40 mg at 06/14/11 1136   .  famotidine (PEPCID) tablet 20 mg   20 mg  Oral  Daily  Elam Dutch, MD     20 mg at 06/14/11 1048   .  feeding supplement (ENSURE CLINICAL STRENGTH) liquid 237 mL   237 mL  Oral  TID WC  Sherre Scarlet, RD     237 mL at 06/14/11 1641   .  fentaNYL (SUBLIMAZE) 0.05 MG/ML injection                  .  furosemide (LASIX) injection 10 mg   10 mg  Intravenous  Once  Pearletha Furl, MD     10 mg at 06/06/11 1433   .  guaiFENesin-dextromethorphan (ROBITUSSIN DM) 100-10 MG/5ML syrup 15 mL   15 mL  Oral  Q4H PRN  Ulyses Amor, PA         .  heparin 2-0.9 UNIT/ML-% infusion                  .  hydrALAZINE (APRESOLINE) injection 10 mg   10 mg  Intravenous  Q2H PRN  Ulyses Amor, PA     10 mg at 06/11/11 0650   .  labetalol (NORMODYNE,TRANDATE) 5 MG/ML injection                  .  labetalol (NORMODYNE,TRANDATE) injection 10 mg   10 mg  Intravenous  Q2H PRN  Ulyses Amor, PA     10 mg at 06/08/11 0110   .  lidocaine (XYLOCAINE) 1 % injection                  .  magnesium sulfate IVPB 2 g 50 mL   2 g  Intravenous  Once PRN   Ulyses Amor, PA         .  metoprolol (LOPRESSOR) injection 2-5 mg   2-5 mg  Intravenous  Q2H PRN  Ulyses Amor, PA     5 mg at 06/08/11 1026   .  metoprolol (LOPRESSOR) injection 2-5 mg   2-5 mg  Intravenous  Q2H PRN  Lindell Spar, PA         .  midazolam (VERSED) 2 MG/2ML injection                  .  morphine 2 MG/ML injection 2-5 mg   2-5 mg  Intravenous  Q1H PRN  Ulyses Amor, PA     4 mg at 06/12/11 2228   .  morphine 2 MG/ML injection                  .  morphine 4 MG/ML injection 4 mg   4 mg  Intravenous  Once  Abran Richard, PA     4 mg at 05/31/11 1102   .  ondansetron (ZOFRAN) injection 4 mg   4 mg  Intravenous  Once  Abran Richard, PA     4 mg at 05/31/11 1101   .  ondansetron (ZOFRAN) injection 4 mg   4 mg  Intravenous  Q6H PRN  Ulyses Amor, PA         .  ondansetron [facility]) tablet 4 mg   4 mg  Oral  Q6H PRN  Barton Dubois, MD         .  oxyCODONE (Oxy IR/ROXICODONE) immediate release tablet 5-10 mg   5-10 mg  Oral  Q4H PRN  Ulyses Amor, PA     10 mg at 06/14/11 2311   .  pantoprazole (PROTONIX) injection 40 mg   40 mg  Intravenous  Once  Abran Richard, PA     40 mg at 05/31/11 1500   .  phenol (CHLORASEPTIC) mouth spray 1 spray   1 spray  Mouth/Throat  PRN  Ulyses Amor, PA         .  piperacillin-tazobactam (ZOSYN) IVPB 3.375 g   3.375 g  Intravenous  Once  Randa Spike, PHARMD     3.375 g at 05/31/11 1759   .  pneumococcal 23 valent vaccine (PNU-IMMUNE) injection 0.5 mL   0.5 mL  Intramuscular  Tomorrow-1000  Barton Dubois, MD     0.5 mL at 06/01/11 0926   .  potassium chloride SA (K-DUR,KLOR-CON) CR tablet 20 mEq   20 mEq  Oral  Daily  Jolaine Artist, MD     20 mEq at 06/14/11 1048   .  potassium chloride SA (K-DUR,KLOR-CON) CR tablet 20-40 mEq   20-40 mEq  Oral  Once PRN  Ulyses Amor, PA         .  potassium chloride SA (K-DUR,KLOR-CON) CR tablet 20-40 mEq   20-40 mEq  Oral  Once PRN  Lindell Spar, PA         .   senna-docusate (Senokot-S) tablet 1 tablet   1 tablet  Oral  QHS PRN  Ulyses Amor, PA         .  sodium chloride 0.9 % bolus 1,000 mL   1,000 mL  Intravenous  Once  Abran Richard, PA     1,000 mL at 05/31/11 1102   .  sodium chloride 0.9 % bolus 1,000 mL   1,000 mL  Intravenous  Once  Abran Richard, PA     1,000 mL at 05/31/11 1227   .  vancomycin (VANCOCIN) 750 mg in sodium chloride 0.9 % 150 mL IVPB   750 mg  Intravenous  Q24H  Ulyses Amor, PA     750 mg at 06/06/11 2041   .  vancomycin (VANCOCIN) IVPB 1000 mg/200 mL premix   1,000 mg  Intravenous  Once  Randa Spike, PHARMD     1,000 mg at 05/31/11 1828   .  DISCONTD: 0.9 %  sodium  chloride infusion     Intravenous  Once  Abran Richard, [place]         .  DISCONTD: 0.9 %  sodium chloride infusion     Intravenous  Continuous  Reyne Dumas, MD  50 mL/hr at 06/05/11 0830      .  DISCONTD: 0.9 %  sodium chloride infusion     Intravenous  Continuous  Ulyses Amor, PA  100 mL/hr at 06/07/11 0800      .  DISCONTD: 0.9 %  sodium chloride infusion     Intravenous  Continuous  Sydnee Levans, MD  50 mL/hr at 06/12/11 1019      .  DISCONTD: 0.9 % irrigation (POUR BTL)       PRN  Elam Dutch, MD     2,000 mL at 06/06/11 0940   .  DISCONTD: 0.9 % irrigation (POUR BTL)       PRN  Elam Dutch, MD     1,000 mL at 06/12/11 1157   .  DISCONTD: acetaminophen (TYLENOL) suppository 325-650 mg   325-650 mg  Rectal  Q4H PRN  Ulyses Amor, PA         .  DISCONTD: acetaminophen (TYLENOL) suppository 650 mg   650 mg  Rectal  Q6H PRN  Barton Dubois, MD         .  DISCONTD: acetaminophen (TYLENOL) tablet 325-650 mg   325-650 mg  Oral  Q4H PRN  Ulyses Amor, PA         .  DISCONTD: acetaminophen (TYLENOL) tablet 650 mg   650 mg  Oral  Q6H PRN  Barton Dubois, MD         .  DISCONTD: acetaminophen (TYLENOL) tablet 650 mg   650 mg  Oral  Q4H PRN  Angelia Mould, MD         .  DISCONTD: acetylcysteine (MUCOMYST) NICU oral/rectal  solution 10%   1 mL/kg  Oral  BID  [name], [place]         .  DISCONTD: albuterol (PROVENTIL) (5 MG/ML) 0.5% nebulizer solution 2.5 mg   2.5 mg  Nebulization  Q4H  Doree Fudge, MD     2.5 mg at 06/07/11 0445   .  DISCONTD: ALPRAZolam [facility] Moron) tablet 1 mg   1 mg  Oral  QHS PRN  Elam Dutch, MD         .  DISCONTD: aspirin EC tablet 325 mg   325 mg  Oral  Daily  Angelia Mould, MD     325 mg at 06/05/11 1011   .  DISCONTD: aspirin EC tablet 81 mg   81 mg  Oral  Daily  Barton Dubois, MD         .  DISCONTD: aspirin EC tablet 81 mg   81 mg  Oral  Daily  Barton Dubois, MD     81 mg at 06/03/11 0909   .  DISCONTD: aspirin tablet 162.5 mg   162.5 mg  Oral  Daily  Jolaine Artist, MD         .  DISCONTD: aspirin tablet 325 mg   325 mg  Oral  Daily  Elam Dutch, MD     325 mg at 06/09/11 1127   .  DISCONTD: carvedilol (COREG) tablet 12.5 mg   12.5 mg  Oral  BID WC  Jolaine Artist, MD     12.5 mg  at 06/11/11 1635   .  DISCONTD: carvedilol (COREG) tablet 25 mg   25 mg  Oral  BID WC  Fay Records, MD     25 mg at 06/12/11 0630   .  DISCONTD: cefUROXime (ZINACEF) 1.5 g in dextrose 5 % 50 mL IVPB   1.5 g  Intravenous  Q12H  Ulyses Amor, PA         .  DISCONTD: digoxin (LANOXIN) 0.25 MG/ML injection 0.125 mg   0.125 mg  Intravenous  Q4H  Angelia Mould, MD     0.125 mg at 06/08/11 1100   .  DISCONTD: digoxin (LANOXIN) tablet 0.125 mg   0.125 mg  Oral  Daily  Angelia Mould, MD         .  DISCONTD: DOPamine (INTROPIN) 800 mg in dextrose 5 % 250 mL infusion   3-5 mcg/kg/min  Intravenous  Continuous  Ulyses Amor, PA         .  DISCONTD: enoxaparin (LOVENOX) injection 100 mg   100 mg  Subcutaneous  Q12H  Dimple Nanas, PHARMD     100 mg at 06/08/11 1439   .  DISCONTD: enoxaparin (LOVENOX) injection 30 mg   30 mg  Subcutaneous  Q24H  Barton Dubois, MD     30 mg at 06/01/11 2141   .  DISCONTD: enoxaparin (LOVENOX) injection 40 mg   40 mg  Subcutaneous  Q24H   Bonnielee Haff, MD     40 mg at 06/02/11 2133   .  DISCONTD: enoxaparin (LOVENOX) injection 40 mg   40 mg  Subcutaneous  QHS  Angelia Mould, MD     40 mg at 06/05/11 2221   .  DISCONTD: enoxaparin (LOVENOX) injection 40 mg   40 mg  Subcutaneous  Q24H  Lavonia Drafts [place], PHARMD     40 mg at 06/11/11 0953   .  DISCONTD: enoxaparin (LOVENOX) injection 50 mg   50 mg  Subcutaneous  Q24H  Thuy Dien Dang, PHARMD     50 mg at 06/09/11 1127   .  DISCONTD: famotidine (PEPCID) IVPB 20 mg   20 mg  Intravenous  Q12H  Ulyses Amor, PA     20 mg at 06/07/11 2104   .  DISCONTD: famotidine (PEPCID) IVPB 20 mg   20 mg  Intravenous  Q12H  Lindell Spar, PA         .  DISCONTD: fentaNYL (SUBLIMAZE) injection 50-100 mcg   50-100 mcg  Intravenous  PRN  Sydnee Levans, MD         .  DISCONTD: furosemide (LASIX) injection 20 mg   20 mg  Intravenous  Once  Lindell Spar, PA         .  DISCONTD: furosemide (LASIX) injection 40 mg   40 mg  Intravenous  BID  Dayna N Dunn, PA     40 mg at 06/09/11 0805   .  DISCONTD: furosemide (LASIX) tablet 40 mg   40 mg  Oral  Daily  Jolaine Artist, MD         .  DISCONTD: heparin 6,000 Units in sodium chloride irrigation 0.9 % 500 mL irrigation       PRN  Elam Dutch, MD         .  DISCONTD: hydrALAZINE (APRESOLINE) injection 10 mg   10 mg  Intravenous  Q6H PRN  Barton Dubois, MD  10 mg at 06/04/11 0957   .  DISCONTD: hydrochlorothiazide (HYDRODIURIL) tablet 25 mg   25 mg  Oral  Daily  Jolaine Artist, MD     25 mg at 06/14/11 1048   .  DISCONTD: HYDROmorphone (DILAUDID) injection 0.25-0.5 mg   0.25-0.5 mg  Intravenous  Q5 min PRN  Roberts Gaudy, MD         .  DISCONTD: HYDROmorphone (DILAUDID) injection 0.25-0.5 mg   0.25-0.5 mg  Intravenous  Q5 min PRN  W. Oren Bracket, MD         .  DISCONTD: ipratropium (ATROVENT) nebulizer solution 0.5 mg   0.5 mg  Nebulization  Q4H  Doree Fudge, MD     0.5 mg at 06/07/11 0445   .   DISCONTD: magnesium sulfate IVPB 2 g 50 mL   2 g  Intravenous  Once PRN  Lindell Spar, PA         .  DISCONTD: metoprolol (LOPRESSOR) tablet 50 mg   50 mg  Oral  BID  Jolaine Artist, MD     50 mg at 06/09/11 2119   .  DISCONTD: metoprolol tartrate (LOPRESSOR) tablet 25 mg   25 mg  Oral  BID  Lindell Spar, PA     25 mg at 06/08/11 1252   .  DISCONTD: metoprolol tartrate (LOPRESSOR) tablet 25 mg   25 mg  Oral  Q6H  Dayna N Dunn, PA     25 mg at 06/09/11 0609   .  DISCONTD: midazolam (VERSED) injection 1-2 mg   1-2 mg  Intravenous  PRN  Sydnee Levans, MD         .  DISCONTD: morphine 2 MG/ML injection 2 mg   2 mg  Intravenous  Q3H PRN  Barton Dubois, MD     2 mg at 06/06/11 0537   .  DISCONTD: nicotine (NICODERM CQ - dosed in mg/24 hours) patch 14 mg   14 mg  Transdermal  Daily  Barton Dubois, MD     14 mg at 06/05/11 1013   .  DISCONTD: ondansetron (ZOFRAN) injection 4 mg   4 mg  Intravenous  Q6H PRN  Barton Dubois, MD     4 mg at 06/05/11 2217   .  DISCONTD: ondansetron (ZOFRAN) injection 4 mg   4 mg  Intravenous  Q6H PRN  Angelia Mould, MD         .  DISCONTD: ondansetron [facility]) injection 4 mg   4 mg  Intravenous  Once PRN  Roberts Gaudy, MD         .  DISCONTD: oxyCODONE (Oxy IR/ROXICODONE) immediate release tablet 5 mg   5 mg  Oral  Q4H PRN  Barton Dubois, MD     5 mg at 06/05/11 0940   .  DISCONTD: oxyCODONE-acetaminophen (PERCOCET) 5-325 MG per tablet 1-2 tablet   1-2 tablet  Oral  Q3H PRN  Angelia Mould, MD     2 tablet at 06/06/11 0235   .  DISCONTD: pantoprazole (PROTONIX) EC tablet 40 mg   40 mg  Oral  Q1200  Barton Dubois, MD     40 mg at 06/04/11 1137   .  DISCONTD: piperacillin-tazobactam (ZOSYN) IVPB 3.375 g   3.375 g  Intravenous  Q8H  Christine E Shade, PHARMD     3.375 g at 06/06/11 0537   .  DISCONTD: piperacillin-tazobactam (ZOSYN) IVPB 3.375 g   3.375 g  Intravenous  Q8H  Ulyses Amor, PA     3.375 g at 06/07/11 0534   .  DISCONTD:  polyethylene glycol (MIRALAX / GLYCOLAX) packet 17 g   17 g  Oral  Daily PRN  Barton Dubois, MD         .  DISCONTD: sodium bicarbonate 50 mEq in dextrose 5 % 1,000 mL infusion     Intravenous  Continuous  Bonnielee Haff, MD  100 mL/hr at 06/02/11 1359      .  DISCONTD: sodium bicarbonate 50 mEq in sodium chloride 0.45 % 1,000 mL infusion     Intravenous  Continuous  Reyne Dumas, MD  75 mL/hr at 06/04/11 2235      .  DISCONTD: Surgifoam 100 with Thrombin 20,000 units (20 ml) topical solution       PRN  Elam Dutch, MD         .  DISCONTD: vancomycin (VANCOCIN) 750 mg in sodium chloride 0.9 % 150 mL IVPB   750 mg  Intravenous  Q24H  Randa Spike, PHARMD     750 mg at 06/02/11 2133   .  DISCONTD: vancomycin (VANCOCIN) 750 mg in sodium chloride 0.9 % 150 mL IVPB   750 mg  Intravenous  Q12H  Thuy Dien Dang, PHARMD         .  DISCONTD: vancomycin (VANCOCIN) 750 mg in sodium chloride 0.9 % 150 mL IVPB   750 mg  Intravenous  Q12H  Reyne Dumas, MD     750 mg at 06/05/11 1742   .  DISCONTD: vancomycin (VANCOCIN) 750 mg in sodium chloride 0.9 % 150 mL IVPB   750 mg  Intravenous  Q24H  Reyne Dumas, MD             Medications Prior to Admission   Medication  Sig  Dispense  Refill   .  aspirin 81 MG EC tablet  Take 81 mg by mouth daily.           .  hydrochlorothiazide 25 MG tablet  Take 25 mg by mouth daily.           .  metoprolol (LOPRESSOR) 25 MG tablet  Take 25 mg by mouth 2 (two) times daily.              Home: Home Living Lives With: Spouse (plan to stay with daughter who can provide 24 hour care) Receives Help From: Family (very supportive daughter) Type of Home: House Home Layout: One level Home Access: Stairs to enter Entrance Stairs-Rails: Right;Left;Can reach both Entrance Stairs-Number of Steps: 3 Bathroom Shower/Tub: Chiropodist: Standard Home Adaptive Equipment: None    Functional History: Prior Function Level of Independence: Independent with basic  ADLs;Independent with homemaking with ambulation;Independent with gait;Independent with transfers Driving: No Vocation: Retired Comments: Pt lives with spouse but plans to D/C to dgtr's apt with 3 steps who can provide 24hr care   Functional Status:   Mobility: Bed Mobility Bed Mobility: Yes Supine to Sit: With rails;HOB elevated (Comment degrees);1: +2 Total assist (HOB at ~30degrees. Pt=40%) Supine to Sit Details (indicate cue type and reason): assist to bring RLE to EOB, VC for hand placement, assist to bring trunk upright and use of pad to bring pt's hips to EOB Sitting - Scoot to Edge of Bed: 6: Modified independent (Device/Increase time) Sitting - Scoot to Edge of Bed Details (indicate cue type and reason): increased time Transfers Transfers: Yes Sit to  Stand: 4: Min assist;From bed Sit to Stand Details (indicate cue type and reason): cues for hand placement, RLE position and sequence Stand to Sit: 5: Supervision Stand to Sit Details: cues for hand placement, safety and control of descent Stand Pivot Transfers: 1: +2 Total assist;Patient percentage (comment) (60%) Stand Pivot Transfer Details (indicate cue type and reason): sequencing cues, cues for upright posture; heel-toe pivot with RW bed->3in1 and 3in1->recliner all towards right side Ambulation/Gait Ambulation/Gait: No Ambulation/Gait Assistance: 4: Min assist Ambulation/Gait Assistance Details (indicate cue type and reason): cues for sequence, pt maintaining trunk flexion and elbow flexion despite max cueing for upright trunk and bilUE use to assist with stepping Ambulation Distance (Feet): 16 Feet Assistive device: Rolling walker Gait Pattern: Step-to pattern;Decreased step length - right;Decreased step length - left;Trunk flexed Gait velocity: slowly Stairs: No Wheelchair Mobility Wheelchair Mobility: No   ADL: ADL Eating/Feeding: Simulated;Independent Where Assessed - Eating/Feeding: Chair Grooming:  Simulated;Wash/dry face;Set up Where Assessed - Grooming: Sitting, chair Upper Body Bathing: Simulated;Set up Where Assessed - Upper Body Bathing: Sitting, chair Lower Body Bathing: Simulated;Maximal assistance Where Assessed - Lower Body Bathing: Sit to stand from chair Upper Body Dressing: Simulated;Set up Where Assessed - Upper Body Dressing: Sitting, chair Lower Body Dressing: Simulated;+1 Total assistance Where Assessed - Lower Body Dressing: Sit to stand from chair Toilet Transfer: Performed;+2 Total assistance (pt=60%) Toilet Transfer Details (indicate cue type and reason): stand with pivotal steps with RLE to bedside commode. pt unable to fully unweight RLE (as with hopping)   Armed forces technical officer Method: Arts development officer: Pharmacist, community - Clothing Manipulation: Performed;+1 Total assistance Where Assessed - Camera operator Manipulation: Standing Toileting - Hygiene: Performed;Minimal assistance Where Assessed - Toileting Hygiene: Standing Tub/Shower Transfer: Not assessed Equipment Used: Rolling walker Ambulation Related to ADLs: see toilet transfer   Cognition: Cognition Arousal/Alertness: Awake/alert Orientation Level: Oriented X4 Cognition Arousal/Alertness: Awake/alert Overall Cognitive Status: Appears within functional limits for tasks assessed Orientation Level: Oriented X4 Cognition - Other Comments: Pt unable to recall date (month, day or year) This was concerning to her as she normally does not have issues with this- may be med related?     Blood pressure 144/74, pulse 69, temperature 97.2 F (36.2 C), temperature source Oral, resp. rate 18, height 5\' 8"  (1.727 m), weight 93.1 kg (205 lb 4 oz), SpO2 96.00%. Physical Exam  Vitals reviewed.   Constitutional: She is oriented to person, place, and time. She appears well-developed.   HENT:   Head: Normocephalic. PERRL, EOMI Neck: Normal range of motion. Neck supple. No thyromegaly  present.   Cardiovascular: Normal rate and rhythm without MRG Pulmonary/Chest: Breath sounds normal. She has no wheezes, rales or rhonchi.   Abdominal: She exhibits no distension. There is no tenderness. She has a large umbilical hernia about the size of a softball Neurological: She is alert and oriented to person, place, and time. Strength 5/5 UE's and RLE.  LLE 2/5 HF. No sensory deficits seen. DTR's are 1+.  Cognitively quite appropriate. CN's intact Skin:  Incision clean and dry with dressing in place along AK.  Left thigh incision clean with staples and inguinal area a little moist but intact.   RLE with some callouses on foot but intact.  onchomychotic nails  Psychiatric: She has a normal mood and affect      Results for orders placed during the hospital encounter of 05/31/11 (from the past 48 hour(s))   BASIC METABOLIC PANEL     Status: Abnormal  Collection Time     06/14/11  6:29 AM       Component  Value  Range  Comment     Sodium  132 (*)  135 - 145 (mEq/L)       Potassium  4.6   3.5 - 5.1 (mEq/L)       Chloride  98   96 - 112 (mEq/L)       CO2  27   19 - 32 (mEq/L)       Glucose, Bld  123 (*)  70 - 99 (mg/dL)       BUN  31 (*)  6 - 23 (mg/dL)       Creatinine, Ser  1.76 (*)  0.50 - 1.10 (mg/dL)       Calcium  8.9   8.4 - 10.5 (mg/dL)       GFR calc non Af Amer  29 (*)  >90 (mL/min)       GFR calc Af Amer  34 (*)  >90 (mL/min)     CBC     Status: Abnormal     Collection Time     06/14/11  6:29 AM       Component  Value  Range  Comment     WBC  12.8 (*)  4.0 - 10.5 (K/uL)       RBC  3.00 (*)  3.87 - 5.11 (MIL/uL)       Hemoglobin  8.5 (*)  12.0 - 15.0 (g/dL)       HCT  26.1 (*)  36.0 - 46.0 (%)       MCV  87.0   78.0 - 100.0 (fL)       MCH  28.3   26.0 - 34.0 (pg)       MCHC  32.6   30.0 - 36.0 (g/dL)       RDW  16.4 (*)  11.5 - 15.5 (%)       Platelets  422 (*)  150 - 400 (K/uL)     CBC     Status: Abnormal     Collection Time     06/15/11  4:30 AM        Component  Value  Range  Comment     WBC  11.8 (*)  4.0 - 10.5 (K/uL)       RBC  2.99 (*)  3.87 - 5.11 (MIL/uL)       Hemoglobin  8.7 (*)  12.0 - 15.0 (g/dL)       HCT  26.5 (*)  36.0 - 46.0 (%)       MCV  88.6   78.0 - 100.0 (fL)       MCH  29.1   26.0 - 34.0 (pg)       MCHC  32.8   30.0 - 36.0 (g/dL)       RDW  16.3 (*)  11.5 - 15.5 (%)       Platelets  497 (*)  150 - 400 (K/uL)     BASIC METABOLIC PANEL     Status: Abnormal     Collection Time     06/15/11  4:30 AM       Component  Value  Range  Comment     Sodium  131 (*)  135 - 145 (mEq/L)       Potassium  4.5   3.5 - 5.1 (mEq/L)       Chloride  97   96 -  112 (mEq/L)       CO2  26   19 - 32 (mEq/L)       Glucose, Bld  120 (*)  70 - 99 (mg/dL)       BUN  33 (*)  6 - 23 (mg/dL)       Creatinine, Ser  1.81 (*)  0.50 - 1.10 (mg/dL)       Calcium  8.8   8.4 - 10.5 (mg/dL)       GFR calc non Af Amer  28 (*)  >90 (mL/min)       GFR calc Af Amer  32 (*)  >90 (mL/min)      No results found.   Post Admission Physician Evaluation: Functional deficits secondary  to Left AKA after failed BPG. Patient is admitted to receive collaborative, interdisciplinary care between the physiatrist, rehab nursing staff, and therapy team. Patient's level of medical complexity and substantial therapy needs in context of that medical necessity cannot be provided at a lesser intensity of care such as a SNF. Patient has experienced substantial functional loss from his/her baseline which was documented above under the "Functional History" and "Functional Status" headings.  Judging by the patient's diagnosis, physical exam, and functional history, the patient has potential for functional progress which will result in measurable gains while on inpatient rehab.  These gains will be of substantial and practical use upon discharge  in facilitating mobility and self-care at the household level. Physiatrist will provide 24 hour management of medical needs as well as  oversight of the therapy plan/treatment and provide guidance as appropriate regarding the interaction of the two. 24 hour rehab nursing will assist with bladder management, bowel management, safety, skin/wound care, disease management, medication administration, pain management and patient education  and help integrate therapy concepts, techniques,education, etc. PT will assess and treat for:  LES, ROM, fxnl mobility, pre-pros ed, safety.  Goals are: mod I w/c and supervision to min assist with walker for short dx. OT will assess and treat for: UES, ROM, ADL's, safey, fxnl mobility.   Goals are: mod I at w/c level. SLP will assess and treat for: not app Case Management and Social Worker will assess and treat for psychological issues and discharge planning. Team conference will be held weekly to assess progress toward goals and to determine barriers to discharge. Patient will receive at least 3 hours of therapy per day at least 5 days per week. ELOS and Prognosis: 1 week excellent     Medical Problem List and Plan: 1. 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 DVT Prophylaxis/Anticoagulation: Subcutaneous Lovenox. Monitor platelet counts and any signs of bleeding 3. Pain Management: Oxycodone as needed. Monitor mental status. 4. Postoperative atrial fibrillation. Patient converted to normal sinus rhythm. Followup cardiology services 5. Congestive heart failure. Weigh patient daily and monitor for any signs of fluid overload. 6. Chronic renal insufficiency. Baseline creatinine 1.9. Strict I&O. HCTZ 25 mg daily that patient was on prior to admission held 06/14/2011 due to increasing renal function. Consider resuming back in a couple of days if renal function is stable per cardiology services 7. Hypertension. Norvasc 10 mg daily, Coreg 25 mg twice daily 8. Hyperlipidemia. Lipitor 9.  Multiple gallstones/cholelithiasis/ventral hernia. Plan followup per Gen. surgery once patient stabilized from vascular issues to consider laparoscopic cholecystectomy and hernia repair             -  pt feels current bowel regimen too aggressive.  Will back off meds a bit. 10. Wound care-groin incisions intact. Watch incision along inguinal fold which is a bit moist             -continue allevyn dressing to AK incision   Oval Linsey, MD  06/15/11  12:50

## 2011-06-16 NOTE — Evaluation (Signed)
Occupational Therapy Assessment and Plan  Patient Details  Name: Stephanie Griffith MRN: [id number] Date of Birth: 05/20/1945  OT Diagnosis: muscle weakness (generalized) Rehab Potential: Rehab Potential: Good ELOS: 2 weeks (2 weeks)   Today's Date: 06/16/2011 Time: 1st session- 60 min (1100-1215)  2nd session :1430-1520  (50 min) Time Calculation (min):  75 min;  50 min  Problem List:  Patient Active Problem List  Diagnoses  . ANEMIA-NOS  . Essential hypertension, benign  . Chronic kidney disease (CKD), stage III (moderate)  . CHEST PAIN, HX OF  . Dry gangrene  . Abdominal pain  . Leukocytosis  . Hyponatremia  . Hyperglycemia  . Acute on chronic renal failure  . PAD (peripheral artery disease)  . Cholelithiasis and cholecystitis without obstruction  . Ventral hernia  . Atrial fibrillation  . NSTEMI (non-ST elevated myocardial infarction)  . Acute diastolic heart failure  . S/P AKA (above knee amputation)    Past Medical History:  Past Medical History  Diagnosis Date  . Hypertension   . Hernia   . Chronic renal insufficiency     Cr 1.38-1.93 in 2009   Past Surgical History:  Past Surgical History  Procedure Date  . Tubal ligation   . Femoral-popliteal bypass graft 06/06/2011    Procedure: BYPASS GRAFT FEMORAL-POPLITEAL ARTERY;  Surgeon: Elam Dutch, MD;  [facility]: [facility];  Service: Vascular;  Laterality: Left;  left femoral to popliteal bypass with composite vein and propaten 53mm x 80 graft  . Amputation 06/06/2011    Procedure: AMPUTATION DIGIT;  Surgeon: Elam Dutch, MD;  Location: [facility] OR;  Service: Vascular;  Laterality: Left;  Transmetatarsal amputation left great toe    Assessment & Plan Clinical Impression: Patient is a 66 y.o. year old female with recent admission to the hospital on February 28 with nausea as well abdominal pain and white blood cell count of 17,000 and creatinine 2.97. Patient with dry gangrene of left lower extremity superimposed cellulitis  and placed on intravenous antibiotics   Patient transferred to CIR on 06/15/2011   Patient currently requires mod with basic self-care skills secondary to muscle weakness.  Prior to hospitalization, patient could complete BADL with independent leval.  Patient will benefit from skilled intervention to decrease level of assist with basic self-care skills prior to discharge home with care partner.  Anticipate patient will require intermittent supervision and follow up home health.  OT - End of Session Activity Tolerance: Tolerates 30+ min activity with multiple rests Endurance Deficit: No OT Assessment Rehab Potential: Good Barriers to Discharge: None OT Plan OT Frequency: 1-2 X/day, 60-90 minutes Estimated Length of Stay: 2 weeks (2 weeks) OT Treatment/Interventions: Balance/vestibular training;Discharge planning;DME/adaptive equipment instruction;Functional mobility training;Neuromuscular re-education;Pain management;Patient/family education;Self Care/advanced ADL retraining;Therapeutic Activities;Therapeutic Exercise;UE/LE Strength taining/ROM;Wheelchair propulsion/positioning  OT Evaluation Precautions/Restrictions  Precautions Precautions: Fall Required Braces or Orthoses: No Restrictions Weight Bearing Restrictions: Yes RUE Weight Bearing: Weight bearing as tolerated LUE Weight Bearing: Weight bearing as tolerated RLE Weight Bearing: Weight bearing as tolerated LLE Weight Bearing: Non weight bearing Other Position/Activity Restrictions:  L AKA General Chart Reviewed: Yes Family/Caregiver Present: Yes    Pain:  5/10 resisdual limb with movement.  See MAR   Home Living/Prior Functioning Home Living Lives With: Spouse:  Pt. Plans to go to daughters house after discharge Receives Help From: Family Type of Home: House Home Layout: One level Home Access: Stairs to enter Entrance Stairs-Rails: Right;Left;Can reach both Entrance Stairs-Number of Steps: No steps to enter  daughter's home Engineer, petroleum)  Bathroom Shower/Tub: Optometrist: Yes How Accessible: Accessible via walker Home Adaptive Equipment: None IADL History Homemaking Responsibilities: Yes Meal Prep Responsibility: Primary Laundry Responsibility: Primary Prior Function Level of Independence: Independent with basic ADLs;Independent with homemaking with ambulation;Independent with gait;Independent with transfers Able to Take Stairs?: Yes Driving: No 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, corporate) at age 10) ADL ADL Eating: Set up Where Assessed-Eating: Edge of bed Grooming: Setup Where Assessed-Grooming: Edge of bed;Bed level Upper Body Bathing: Setup Where Assessed-Upper Body Bathing: Edge of bed Lower Body Bathing: Moderate assistance Where Assessed-Lower Body Bathing: Edge of bed;Bed level Upper Body Dressing: Setup Where Assessed-Upper Body Dressing: Edge of bed Lower Body Dressing: Moderate assistance Where Assessed-Lower Body Dressing: Edge of bed;Bed level Toileting: Not assessed Toilet Transfer Method: Not assessed Tub/Shower Transfer: Not assessed Vision/Perception  Vision - History Baseline Vision: Wears glasses all the time Vision - Assessment Eye Alignment: Within Functional Limits  Cognition Overall Cognitive Status: Appears within functional limits for tasks assessed Orientation Level: Oriented X4 Attention: Alternating Alternating Attention: Appears intact Memory: Appears intact Awareness: Appears intact Problem Solving: Appears intact Executive Function: Decision Making;Sequencing;Reasoning Reasoning: Appears intact Decision Making: Appears intact Sensation Sensation Light Touch: Appears Intact Coordination Gross Motor Movements are Fluid and Coordinated: Yes Fine Motor Movements are Fluid and Coordinated: Yes Motor    Mobility  Bed Mobility Bed Mobility: Yes Supine to Sit: 4: Min assist Supine to Sit  Details: Verbal cues for safe use of DME/AE;Visual cues for safe use of DME/AE Transfers Transfers: Yes Sit to Stand: 4: Min assist Sit to Stand Details: Tactile cues for weight shifting;Visual cues/gestures for sequencing;Verbal cues for technique  Trunk/Postural Assessment  Cervical Assessment Cervical Assessment: Within Functional Limits Thoracic Assessment Thoracic Assessment: Within Functional Limits Lumbar Assessment Lumbar Assessment: Exceptions to [facility] Postural Control Postural Control: Within Functional Limits  Balance Static Sitting Balance Static Sitting - Balance Support: No upper extremity supported Static Sitting - Level of Assistance: 5: Stand by assistance Extremity/Trunk Assessment RUE Assessment RUE Assessment: Within Functional Limits LUE Assessment LUE Assessment: Within Functional Limits  See FIM for current functional status Refer to Care Plan for Long Term Goals  Recommendations for other services: None  Discharge Criteria: Patient will be discharged from OT if patient refuses treatment 3 consecutive times without medical reason, if treatment goals not met, if there is a change in medical status, if patient makes no progress towards goals or if patient is discharged from hospital.  The above assessment, treatment plan, treatment alternatives and goals were discussed and mutually agreed upon: by patient 2nd session:  Pt engaged in bed to wc transfers with moderate assist utilizing the lateral scoot strategy.   She performed wheel chair mobility with moderate assist for turns and educational cues.   Lisa Roca 06/16/2011, 4:03 PM

## 2011-06-16 NOTE — Progress Notes (Signed)
Physical Therapy Note  Patient Details  Name: Stephanie Griffith MRN: [id number] Date of Birth: 16-Jun-1945 Today's Date: 06/16/2011 Time: 1300-1400 (60') Pain:  L AKA 0-5/10 (increases during movement) Precautions: Fall Risk, L AKA, L LE NWB, Contact Precautions/c-dif  Therapeutic Exercise: (15') R LE in sitting with 5lbs ankle weight Wheelchair Management: (15') Mod-A for parts management, min-A for propelling w/c 150' requiring verbal and tactile cueing for sequencing and placement. Therapeutic Activity: (15') Transfer training sit<->stand with initial Mod-A and later min-A, stand-pivot transfer bed<->w/c with Mod-A Gait Training: (15') using RW initially x 12' and min-A and later sized for Standard Walker for safety, stability and being able to adjust so elbows can lock out into full extension which will give better mechanical advantage and energy conservation for ambulation.     Clearence Ped 06/16/2011, 2:05 PM

## 2011-06-16 NOTE — Plan of Care (Signed)
Problem: RH BOWEL ELIMINATION Goal: RH STG MANAGE BOWEL WITH ASSISTANCE STG Manage Bowel with Assistance. Modified Independent  Outcome: Not Progressing Patient has had incontinent episode early this morning with loose stool. Bedpan max assist

## 2011-06-16 NOTE — Progress Notes (Signed)
Physical Therapy Assessment and Plan  Patient Details  Name: Stephanie Griffith MRN: [id number] Date of Birth: 11-13-1945  PT Diagnosis: Difficulty walking, Edema, Muscle weakness and Pain in L LE Rehab Potential:  Good ELOS: 1-2 weeks   Today's Date: 06/16/2011 Time: 1430-1520 Time Calculation (min): 50 min  Problem List:  Patient Active Problem List  Diagnoses  . ANEMIA-NOS  . Essential hypertension, benign  . Chronic kidney disease (CKD), stage III (moderate)  . CHEST PAIN, HX OF  . Dry gangrene  . Abdominal pain  . Leukocytosis  . Hyponatremia  . Hyperglycemia  . Acute on chronic renal failure  . PAD (peripheral artery disease)  . Cholelithiasis and cholecystitis without obstruction  . Ventral hernia  . Atrial fibrillation  . NSTEMI (non-ST elevated myocardial infarction)  . Acute diastolic heart failure  . S/P AKA (above knee amputation)    Past Medical History:  Past Medical History  Diagnosis Date  . Hypertension   . Hernia   . Chronic renal insufficiency     Cr 1.38-1.93 in 2009   Past Surgical History:  Past Surgical History  Procedure Date  . Tubal ligation   . Femoral-popliteal bypass graft 06/06/2011    Procedure: BYPASS GRAFT FEMORAL-POPLITEAL ARTERY;  Surgeon: Stephanie Dutch, MD;  Location: [facility] OR;  Service: Vascular;  Laterality: Left;  left femoral to popliteal bypass with composite vein and propaten 69mm x 80 graft  . Amputation 06/06/2011    Procedure: AMPUTATION DIGIT;  Surgeon: Stephanie Dutch, MD;  Location: [facility] OR;  Service: Vascular;  Laterality: Left;  Transmetatarsal amputation left great toe    Assessment & Plan Clinical Impression: 66 year old right-handed female with history of hypertension chronic kidney disease stage III and baseline creatinine of 1.9 as well as recent left big toe infection with toenail removal by podiatry and antibiotic use as an outpatient. Admitted February 28 with nausea as well abdominal pain and white blood cell count of  17,000 and creatinine 2.97. Patient with dry gangrene of left lower extremity superimposed cellulitis and placed on intravenous antibiotics. Underwent right femoral to below knee popliteal bypass March 6 by Dr. Oneida Griffith and trans metatarsal amputation of left first toe. There was some question of possible fluid overload by chest x-ray as weights had slightly increased. Patient was placed for a short time on intravenous Lasix 40 mg twice daily with close monitoring of renal function. In regards to her initial abdominal pain and nausea a CT of the abdomen and pelvis was completed showing multiple gallstones and some thickening of the gallbladder wall as well as ventral hernia. General surgery Dr. Excell Griffith was consulted and noted planned for laparoscopic cholecystectomy and repair of ventral hernia once patient medically stable from vascular issues. 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 Oneida Griffith. Postoperative pain management. Maintained on subcutaneous lovenox for DVT prophylaxis. M.D. has requested physical medicine rehabilitation consult.  Patient transferred to CIR on 06/15/2011 .   Patient currently requires mod with mobility secondary to muscle weakness.  Prior to hospitalization, patient was Independent with mobility and lived with Spouse in a House.  Home access is No steps to enter daughter's home (Stephanie Griffith)Stairs to enter.  Patient will benefit from skilled PT intervention to maximize safe functional mobility, minimize fall risk and decrease caregiver burden for planned discharge home with 24 hour supervision.  Anticipate patient will benefit from follow up [facility] at discharge.  PT - End of  Session Endurance Deficit: No PT Plan Estimated Length of Stay: 1-2 weeks  PT Evaluation Precautions/Restrictions Precautions Precautions: Fall Required Braces or Orthoses: No Restrictions Weight Bearing Restrictions: Yes RUE  Weight Bearing: Weight bearing as tolerated LUE Weight Bearing: Weight bearing as tolerated RLE Weight Bearing: Weight bearing as tolerated LLE Weight Bearing: Non weight bearing Other Position/Activity Restrictions:  L AKA General @[id number] ([phone]) Vital Signs  See Chart for details Pain Pain Assessment Pain Score:   8 Home Living/Prior Functioning Home Living Lives With: Spouse Receives Help From: Family Type of Home: House Home Layout: One level Home Access: Stairs to enter Entrance Stairs-Rails: Right;Left;Can reach both Entrance Stairs-Number of Steps: No steps to enter daughter's home Engineer, petroleum) Bathroom Shower/Tub: Chiropodist: Standard Bathroom Accessibility: Yes How Accessible: Accessible via walker Home Adaptive Equipment: None Prior Function Level of Independence: Independent with basic ADLs;Independent with homemaking with ambulation;Independent with gait;Independent with transfers Driving: No Vision/Perception  Vision - History Baseline Vision: Wears glasses all the time Vision - Assessment Eye Alignment: Within Functional Limits  Cognition Overall Cognitive Status: Appears within functional limits for tasks assessed Orientation Level: Oriented X4 Attention: Alternating Alternating Attention: Appears intact Memory: Appears intact Awareness: Appears intact Problem Solving: Appears intact Executive Function: Decision Making;Sequencing;Reasoning Reasoning: Appears intact Decision Making: Appears intact Sensation Sensation Light Touch: Appears Intact Coordination Gross Motor Movements are Fluid and Coordinated: Yes Fine Motor Movements are Fluid and Coordinated: Yes Motor   WFL (L AKA) WMobility Bed Mobility Bed Mobility: Yes Supine to Sit: 4: Min assist Supine to Sit Details: Verbal cues for safe use of DME/AE;Visual cues for safe use of DME/AE Transfers Transfers: Yes Sit to Stand: 4: Min assist Sit to Stand Details:  Tactile cues for weight shifting;Visual cues/gestures for sequencing;Verbal cues for technique Locomotion  Ambulation Ambulation: Yes Ambulation/Gait Assistance: 4: Min assist Ambulation Distance (Feet): 12 Feet Assistive device: Standard walker Ambulation/Gait Assistance Details: Tactile cues for sequencing;Tactile cues for weight shifting;Tactile cues for posture;Verbal cues for sequencing;Verbal cues for technique;Verbal cues for precautions/safety;Verbal cues for gait pattern;Manual facilitation for weight shifting Gait Gait Pattern: Step-to pattern;Trunk flexed (hopping on R LE secondary to L AKA) Stairs / Additional Locomotion Stairs: No Wheelchair Mobility Wheelchair Mobility: Yes Wheelchair Assistance: 2: Max Technical sales engineer Details: Tactile cues for initiation;Tactile cues for sequencing;Tactile cues for posture;Tactile cues for placement;Verbal cues for sequencing;Verbal cues for technique;Verbal cues for precautions/safety;Verbal cues for safe use of DME/AE;Manual facilitation for placement Wheelchair Propulsion: Both upper extremities Wheelchair Parts Management: Needs assistance Distance: 44' (patient reporting fatigue with limited participation)  Trunk/Postural Assessment  Cervical Assessment Cervical Assessment: Within Functional Limits Thoracic Assessment Thoracic Assessment: Within Functional Limits Lumbar Assessment Lumbar Assessment: Exceptions to [facility] Postural Control Postural Control: Within Functional Limits  Balance Static Sitting Balance Static Sitting - Balance Support: No upper extremity supported Static Sitting - Level of Assistance: 5: Stand by assistance Extremity Assessment  RUE Assessment RUE Assessment: Within Functional Limits LUE Assessment LUE Assessment: Within Functional Limits      See FIM for current functional status Refer to Care Plan for Long Term Goals  Recommendations for other services: None  Discharge Criteria:  Patient will be discharged from PT if patient refuses treatment 3 consecutive times without medical reason, if treatment goals not met, if there is a change in medical status, if patient makes no progress towards goals or if patient is discharged from hospital.  The above assessment, treatment plan, treatment alternatives and goals were discussed and mutually agreed upon: by patient  Clearence Ped 06/16/2011, 4:16 PM

## 2011-06-16 NOTE — Progress Notes (Signed)
Patient ID: Stephanie Griffith, female   DOB: Aug 01, 1945, 66 y.o.   MRN: [id number] Subjective/Complaints: Review of Systems  Gastrointestinal: Positive for diarrhea.  Musculoskeletal: Positive for joint pain.  All other systems reviewed and are negative.  up a lot last night with stools.  Loose per tech   Objective: Vital Signs: Blood pressure 176/78, pulse 80, temperature 98.7 F (37.1 C), temperature source Oral, resp. rate 20, height 5\' 8"  (1.727 m), weight 92.7 kg (204 lb 5.9 oz), SpO2 99.00%. No results found.  Basename 06/15/11 0430 06/14/11 0629  WBC 11.8* 12.8*  HGB 8.7* 8.5*  HCT 26.5* 26.1*  PLT 497* 422*    Basename 06/15/11 0430 06/14/11 0629  NA 131* 132*  K 4.5 4.6  CL 97 98  CO2 26 27  GLUCOSE 120* 123*  BUN 33* 31*  CREATININE 1.81* 1.76*  CALCIUM 8.8 8.9   CBG (last 3)  No results found for this basename: GLUCAP:3 in the last 72 hours  Wt Readings from Last 3 Encounters:  06/16/11 92.7 kg (204 lb 5.9 oz)  06/15/11 93.1 kg (205 lb 4 oz)  06/15/11 93.1 kg (205 lb 4 oz)    Physical Exam:  General appearance: alert, cooperative and no distress Head: Normocephalic, without obvious abnormality, atraumatic Eyes: conjunctivae/corneas clear. PERRL, EOM's intact. Fundi benign. Ears: normal TM's and external ear canals both ears Nose: Nares normal. Septum midline. Mucosa normal. No drainage or sinus tenderness. Throat: lips, mucosa, and tongue normal; teeth and gums normal Neck: no adenopathy, no carotid bruit, no JVD, supple, symmetrical, trachea midline and thyroid not enlarged, symmetric, no tenderness/mass/nodules Back: symmetric, no curvature. ROM normal. No CVA tenderness. Resp: clear to auscultation bilaterally Cardio: regular rate and rhythm, S1, S2 normal, no murmur, click, rub or gallop GI: soft, non-tender; bowel sounds normal; no masses,  no organomegaly Extremities: extremities normal, atraumatic, no cyanosis or edema Pulses: 2+ and symmetric Skin:  Skin color, texture, turgor normal. No rashes or lesions Neurologic: Strength symmetrical. A little anxious and impulsive but otherwise at cognitive baseline. Sensory exam grossly intact.  Incision/Wound: wounds all clean without significant drainage   Assessment/Plan: 1. 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 Physiatrist is providing close team supervision and 24 hour management of active medical problems listed below. Physiatrist and rehab team continue to assess barriers to discharge/monitor patient progress toward functional and medical goals. FIM:          FIM - Radio producer Devices: Recruitment consultant Transfers: 3-To toilet/BSC: Mod A (lift or lower assist)  FIM - IT sales professional Transfer: 3: Chair or W/C > Bed: Mod A (lift or lower assist)     Comprehension Comprehension Mode: Auditory Comprehension: 5-Follows basic conversation/direction: With no assist  Expression Expression Mode: Verbal Expression: 5-Expresses basic needs/ideas: With no assist  Social Interaction Social Interaction: 5-Interacts appropriately 90% of the time - Needs monitoring or encouragement for participation or interaction.  Problem Solving Problem Solving: 5-Solves basic 90% of the time/requires cueing < 10% of the time  Memory Memory: 5-Recognizes or recalls 90% of the time/requires cueing < 10% of the time  1. 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 DVT Prophylaxis/Anticoagulation: Subcutaneous Lovenox. Monitor platelet counts and any signs of bleeding  3. Pain Management: Oxycodone as needed. Monitor mental status. Stable at  present 4. Postoperative atrial fibrillation. Patient converted to normal sinus rhythm. Followup  cardiology services  5. Congestive heart failure. Weigh patient daily and monitor for any signs of fluid overload.  6. Chronic renal insufficiency. Baseline creatinine 1.9. Strict I&O. HCTZ 25 mg daily that patient was on prior to admission held 06/14/2011 due to increasing renal function. Continue to hold given loose stool. 7. Hypertension. Norvasc 10 mg daily, Coreg 25 mg twice daily  8. Hyperlipidemia. Lipitor  9. Multiple gallstones/cholelithiasis/ventral hernia. Plan followup per Gen. surgery once patient stabilized from vascular issues to consider laparoscopic cholecystectomy and hernia repair  -pt feels current bowel regimen too aggressive. Will back off meds a bit.  -check stool for c diff given persistent loose stool 10. Wound care-groin incisions intact. Watch incision along inguinal fold which is a bit moist  -continue allevyn dressing to AK incision-looks good  LOS (Days) 1 A FACE TO FACE EVALUATION WAS PERFORMED  Lailah Marcelli T 06/16/2011, 6:41 AM

## 2011-06-16 NOTE — Progress Notes (Signed)
Patient placed on c-diff precautions- Dr Naaman Plummer notified of results. New orders received. Patient and family educated on precautions. Patient pain managed well with 2 oxy IR at 0912 and 1604. Patient has nicotine patch to Lt arm. Tegaderm and dry dressing applied to left groin  Over small open area. Allevyn left in place over left AKA - mod dark drainage stain appears on bottom of dressing.

## 2011-06-17 LAB — BASIC METABOLIC PANEL
BUN: 33 mg/dL — ABNORMAL HIGH (ref 6–23)
CO2: 26 mEq/L (ref 19–32)
Calcium: 8.7 mg/dL (ref 8.4–10.5)
Chloride: 96 mEq/L (ref 96–112)
Creatinine, Ser: 1.75 mg/dL — ABNORMAL HIGH (ref 0.50–1.10)
GFR calc Af Amer: 34 mL/min — ABNORMAL LOW (ref >90)
GFR calc non Af Amer: 29 mL/min — ABNORMAL LOW (ref >90)
Glucose, Bld: 133 mg/dL — ABNORMAL HIGH (ref 70–99)
Potassium: 4.1 mEq/L (ref 3.5–5.1)
Sodium: 131 mEq/L — ABNORMAL LOW (ref 135–145)

## 2011-06-17 NOTE — Progress Notes (Signed)
Occupational Therapy Session Note  Patient Details  Name: Stephanie Griffith MRN: [id number] Date of Birth: 26-Mar-1946  Today's Date: 06/17/2011 Time: 1030-1125 Time Calculation (min): 55 min  Short Term Goals: Week 1:  OT Short Term Goal 1 (Week 1): Pt. will dress LB at supervision level OT Short Term Goal 2 (Week 1): Pt. will bathe LB at supervision level OT Short Term Goal 3 (Week 1): Pt. will perform toilet transfer at minimal assist.   OT Short Term Goal 4 (Week 1): Pt.  will perform tub transfer at minimal assist. OT Short Term Goal 5 (Week 1): Pt. will perform simple homemaking tasks at wc level with supervision level  Skilled Therapeutic Interventions/Progress Updates:    Pt.  Engaged in therapeutic bathing and dressing at EOB level.  Pt was supervision with bed mobility.  She sat on EOB for bathing.  Pt. Complained of phantom pain during session.  Pt had been nauseated prior to OT session and did not fell like donning clothes.    Therapy Documentation Precautions:  Precautions Precautions: Fall Required Braces or Orthoses: No Restrictions Weight Bearing Restrictions: Yes RUE Weight Bearing: Weight bearing as tolerated LUE Weight Bearing: Weight bearing as tolerated RLE Weight Bearing: Weight bearing as tolerated LLE Weight Bearing: Non weight bearing Other Position/Activity Restrictions:  L AKA      Pain: Pain Assessment Pain Assessment: No/denies pain Pain Score: 0-No pain (Patient reports feeling weak after nausea and vomiting this )  Therapy/Group: Individual Therapy  Lisa Roca 06/17/2011, 11:29 AM

## 2011-06-17 NOTE — Plan of Care (Signed)
Problem: RH SKIN INTEGRITY Goal: RH STG ABLE TO PERFORM INCISION/WOUND CARE W/ASSISTANCE STG Able To Perform Incision/Wound Care With Assistance.  Outcome: Not Progressing Wound care total assist at this time

## 2011-06-17 NOTE — Progress Notes (Signed)
Patient ID: Stephanie Griffith, female   DOB: [birth year], 66 y.o.   MRN: [id number] Patient ID: Stephanie Griffith, female   DOB: 12-13-1945, 66 y.o.   MRN: [id number] Subjective/Complaints: Review of Systems  Gastrointestinal: Positive for diarrhea.  Musculoskeletal: Positive for joint pain.  All other systems reviewed and are negative.  3/17- diarrhea better overnight.  Left leg is tender this am   Objective: Vital Signs: Blood pressure 133/70, pulse 75, temperature 97.9 F (36.6 C), temperature source Oral, resp. rate 18, height 5\' 8"  (1.727 m), weight 92.7 kg (204 lb 5.9 oz), SpO2 99.00%. No results found.  Basename 06/15/11 0430  WBC 11.8*  HGB 8.7*  HCT 26.5*  PLT 497*    Basename 06/15/11 0430  NA 131*  K 4.5  CL 97  CO2 26  GLUCOSE 120*  BUN 33*  CREATININE 1.81*  CALCIUM 8.8   CBG (last 3)  No results found for this basename: GLUCAP:3 in the last 72 hours  Wt Readings from Last 3 Encounters:  06/16/11 92.7 kg (204 lb 5.9 oz)  06/15/11 93.1 kg (205 lb 4 oz)  06/15/11 93.1 kg (205 lb 4 oz)    Physical Exam:  General appearance: alert, cooperative and no distress Head: Normocephalic, without obvious abnormality, atraumatic Eyes: conjunctivae/corneas clear. PERRL, EOM's intact. Fundi benign. Ears: normal TM's and external ear canals both ears Nose: Nares normal. Septum midline. Mucosa normal. No drainage or sinus tenderness. Throat: lips, mucosa, and tongue normal; teeth and gums normal Neck: no adenopathy, no carotid bruit, no JVD, supple, symmetrical, trachea midline and thyroid not enlarged, symmetric, no tenderness/mass/nodules Back: symmetric, no curvature. ROM normal. No CVA tenderness. Resp: clear to auscultation bilaterally Cardio: regular rate and rhythm, S1, S2 normal, no murmur, click, rub or gallop GI: soft, non-tender; bowel sounds normal; no masses,  no organomegaly-large umbilical hernia Extremities: extremities normal, atraumatic, no cyanosis or edema Pulses:  2+ and symmetric Skin: Skin color, texture, turgor normal. No rashes or lesions Neurologic: Strength symmetrical. A little anxious and impulsive but otherwise at cognitive baseline. Sensory exam grossly intact.  Incision/Wound: wounds all clean without significant drainage at amp site or groin.   Assessment/Plan: 1. 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 Physiatrist is providing close team supervision and 24 hour management of active medical problems listed below. Physiatrist and rehab team continue to assess barriers to discharge/monitor patient progress toward functional and medical goals. FIM:       FIM - Toileting Toileting: 0: Activity did not occur  FIM - Radio producer Devices: Bedside commode Toilet Transfers: 3-To toilet/BSC: Mod A (lift or lower assist)  FIM - Control and instrumentation engineer Devices: Bed rails Bed/Chair Transfer: 3: Supine > Sit: Mod A (lifting assist/Pt. 50-74%/lift 2 legs;4: Sit > Supine: Min A (steadying pt. > 75%/lift 1 leg);3: Bed > Chair or W/C: Mod A (lift or lower assist);3: Chair or W/C > Bed: Mod A (lift or lower assist)  FIM - Locomotion: Wheelchair Distance: 30' (patient reporting fatigue with limited participation) Locomotion: Wheelchair: 1: Travels less than 50 ft with maximal assistance (Pt: 25 - 49%) FIM - Locomotion: Ambulation Locomotion: Ambulation Assistive Devices: Environmental consultant - Standard Ambulation/Gait Assistance: 4: Min assist Locomotion: Ambulation: 1: Travels less than 50 ft with minimal assistance (Pt.>75%)  Comprehension Comprehension Mode: Auditory Comprehension: 5-Follows basic conversation/direction: With no assist  Expression Expression Mode: Verbal Expression: 5-Expresses basic needs/ideas: With no  assist  Social Interaction Social Interaction: 5-Interacts appropriately  90% of the time - Needs monitoring or encouragement for participation or interaction.  Problem Solving Problem Solving: 5-Solves basic problems: With no assist  Memory Memory: 5-Recognizes or recalls 90% of the time/requires cueing < 10% of the time  1. 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 DVT Prophylaxis/Anticoagulation: Subcutaneous Lovenox. Monitor platelet counts and any signs of bleeding  3. Pain Management: Oxycodone as needed. Monitor mental status. Stable at present 4. Postoperative atrial fibrillation. Patient converted to normal sinus rhythm. Followup cardiology services  5. Congestive heart failure. Weigh patient daily and monitor for any signs of fluid overload.  6. Chronic renal insufficiency. Baseline creatinine 1.9. Strict I&O. HCTZ 25 mg daily that patient was on prior to admission held 06/14/2011 due to increasing renal function. Continue to hold given loose stool. 7. Hypertension. Norvasc 10 mg daily, Coreg 25 mg twice daily  8. Hyperlipidemia. Lipitor  9. Multiple gallstones/cholelithiasis/ventral hernia. Plan followup per Gen. surgery once patient stabilized from vascular issues to consider laparoscopic cholecystectomy and hernia repair  10. Wound care-groin incisions intact. Watch incision along inguinal fold which is a bit moist  -continue allevyn dressing to AK incision-looks good 11. C Diff diarrhea- flagyl for 10 days. Sx improving already. Some nausea with med. Needs to take with food.  LOS (Days) 2 A FACE TO FACE EVALUATION WAS PERFORMED  Auburn Hester T 06/17/2011, 7:52 AM

## 2011-06-18 ENCOUNTER — Encounter (HOSPITAL_COMMUNITY): Payer: Self-pay | Admitting: Vascular Surgery

## 2011-06-18 LAB — URINALYSIS, ROUTINE W REFLEX MICROSCOPIC
Bilirubin Urine: NEGATIVE
Glucose, UA: NEGATIVE mg/dL
Hgb urine dipstick: NEGATIVE
Ketones, ur: NEGATIVE mg/dL
Nitrite: NEGATIVE
Protein, ur: NEGATIVE mg/dL
Specific Gravity, Urine: 1.014 (ref 1.005–1.030)
Urobilinogen, UA: 0.2 mg/dL (ref 0.0–1.0)
pH: 5.5 (ref 5.0–8.0)

## 2011-06-18 LAB — CBC
HCT: 26.2 % — ABNORMAL LOW (ref 36.0–46.0)
Hemoglobin: 8.6 g/dL — ABNORMAL LOW (ref 12.0–15.0)
MCH: 28.2 pg (ref 26.0–34.0)
MCHC: 32.8 g/dL (ref 30.0–36.0)
MCV: 85.9 fL (ref 78.0–100.0)
Platelets: 769 10*3/uL — ABNORMAL HIGH (ref 150–400)
RBC: 3.05 MIL/uL — ABNORMAL LOW (ref 3.87–5.11)
RDW: 15.9 % — ABNORMAL HIGH (ref 11.5–15.5)
WBC: 19.2 10*3/uL — ABNORMAL HIGH (ref 4.0–10.5)

## 2011-06-18 LAB — DIFFERENTIAL
Basophils Absolute: 0.1 10*3/uL (ref 0.0–0.1)
Basophils Relative: 0 % (ref 0–1)
Eosinophils Absolute: 0.3 10*3/uL (ref 0.0–0.7)
Eosinophils Relative: 2 % (ref 0–5)
Lymphocytes Relative: 7 % — ABNORMAL LOW (ref 12–46)
Lymphs Abs: 1.3 10*3/uL (ref 0.7–4.0)
Monocytes Absolute: 1.8 10*3/uL — ABNORMAL HIGH (ref 0.1–1.0)
Monocytes Relative: 10 % (ref 3–12)
Neutro Abs: 15.6 10*3/uL — ABNORMAL HIGH (ref 1.7–7.7)
Neutrophils Relative %: 82 % — ABNORMAL HIGH (ref 43–77)

## 2011-06-18 LAB — COMPREHENSIVE METABOLIC PANEL
ALT: 9 U/L (ref 0–35)
AST: 18 U/L (ref 0–37)
Albumin: 2.5 g/dL — ABNORMAL LOW (ref 3.5–5.2)
Alkaline Phosphatase: 124 U/L — ABNORMAL HIGH (ref 39–117)
BUN: 32 mg/dL — ABNORMAL HIGH (ref 6–23)
CO2: 25 mEq/L (ref 19–32)
Calcium: 8.7 mg/dL (ref 8.4–10.5)
Chloride: 96 mEq/L (ref 96–112)
Creatinine, Ser: 1.63 mg/dL — ABNORMAL HIGH (ref 0.50–1.10)
GFR calc Af Amer: 37 mL/min — ABNORMAL LOW (ref >90)
GFR calc non Af Amer: 32 mL/min — ABNORMAL LOW (ref >90)
Glucose, Bld: 134 mg/dL — ABNORMAL HIGH (ref 70–99)
Potassium: 4.1 mEq/L (ref 3.5–5.1)
Sodium: 132 mEq/L — ABNORMAL LOW (ref 135–145)
Total Bilirubin: 0.2 mg/dL — ABNORMAL LOW (ref 0.3–1.2)
Total Protein: 6.8 g/dL (ref 6.0–8.3)

## 2011-06-18 LAB — URINE MICROSCOPIC-ADD ON

## 2011-06-18 NOTE — Progress Notes (Signed)
Physical Therapy Session Note  Patient Details  Name: Eliza Costas MRN: [id number] Date of Birth: 11-19-1945  Today's Date: 06/18/2011 Time: 0830-0927 Time Calculation (min): 57 min   Skilled Therapeutic Interventions/Progress Updates: Pt reports she is too tired to go to the gym but agreeable to participate in therapy session in the room. Seated therex with 3# ankle weight on RLE for LAQ, marches, and ankle pumps x 10 reps x 2 sets. Sit to stands with focus on technique and eccentric control to sit x 6 reps with min A. Gait with RW x 10', very shuffled step pattern, cueing to push through UEs for clearance. W/c propulsion 15' x 2 for general UE strengthening and w/c mobility training.      Therapy Documentation Precautions:  Precautions Precautions: Fall Required Braces or Orthoses: No Restrictions Weight Bearing Restrictions: Yes RUE Weight Bearing: Weight bearing as tolerated LUE Weight Bearing: Weight bearing as tolerated RLE Weight Bearing: Weight bearing as tolerated LLE Weight Bearing: Non weight bearing Other Position/Activity Restrictions:  L AKA  Pain:  Denies pain. States she is very tired.  See FIM for current functional status  Therapy/Group: Individual Therapy  [name] [facility] 06/18/2011, 9:28 AM

## 2011-06-18 NOTE — Progress Notes (Signed)
Physical Therapy Note  Patient Details  Name: Stephanie Griffith MRN: [id number] Date of Birth: Jan 11, 1946 Today's Date: 06/18/2011  14:30-15:15 individual therapy pt denied pain.  Pt states she does not tolerate HOB being flat. Hip position for stretching and exercises was very important, and unfortunately she needed to be flat for exercises. Pt given breaks between exercises to raise the [facility]. performed sidelying hip extension and abduction with mod assist to block pelvis x 15 each. Pt and family were educated on preprostetic care including use of shrinker, residual limb shaping, insurance regarding scooter and prosthesis, and decreasing fall risk, phantom sensation, and desensitization techniques.   Erle Crocker 06/18/2011, 4:29 PM

## 2011-06-18 NOTE — Progress Notes (Signed)
Subjective/Complaints: Review of Systems  Gastrointestinal: Positive for diarrhea.  Musculoskeletal: Positive for joint pain.  All other systems reviewed and are negative.  3/18-slept well but woke up all sore and "achey"   Objective: Vital Signs: Blood pressure 122/82, pulse 80, temperature 98.1 F (36.7 C), temperature source Oral, resp. rate 18, height 5\' 8"  (1.727 m), weight 82.781 kg (182 lb 8 oz), SpO2 100.00%. No results found.  Basename 06/18/11 0645  WBC 19.2*  HGB 8.6*  HCT 26.2*  PLT 769*    Basename 06/17/11 0650  NA 131*  K 4.1  CL 96  CO2 26  GLUCOSE 133*  BUN 33*  CREATININE 1.75*  CALCIUM 8.7   CBG (last 3)  No results found for this basename: GLUCAP:3 in the last 72 hours  Wt Readings from Last 3 Encounters:  06/18/11 82.781 kg (182 lb 8 oz)  06/15/11 93.1 kg (205 lb 4 oz)  06/15/11 93.1 kg (205 lb 4 oz)    Physical Exam:  General appearance: alert, cooperative and no distress Head: Normocephalic, without obvious abnormality, atraumatic Eyes: conjunctivae/corneas clear. PERRL, EOM's intact. Fundi benign. Ears: normal TM's and external ear canals both ears Nose: Nares normal. Septum midline. Mucosa normal. No drainage or sinus tenderness. Throat: lips, mucosa, and tongue normal; teeth and gums normal Neck: no adenopathy, no carotid bruit, no JVD, supple, symmetrical, trachea midline and thyroid not enlarged, symmetric, no tenderness/mass/nodules Back: symmetric, no curvature. ROM normal. No CVA tenderness. Resp: clear to auscultation bilaterally Cardio: regular rate and rhythm, S1, S2 normal, no murmur, click, rub or gallop GI: soft, non-tender; bowel sounds normal; no masses,  no organomegaly-large umbilical hernia Extremities: extremities normal, atraumatic, no cyanosis or edema Pulses: 2+ and symmetric Skin: Skin color, texture, turgor normal. No rashes or lesions Neurologic: Strength symmetrical. A little anxious and impulsive but otherwise  at cognitive baseline. Sensory exam grossly intact.  Incision/Wound: wounds all clean without significant drainage at amp site or groin.   Assessment/Plan: 1. 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 Physiatrist is providing close team supervision and 24 hour management of active medical problems listed below. Physiatrist and rehab team continue to assess barriers to discharge/monitor patient progress toward functional and medical goals. FIM: FIM - Bathing Bathing Steps Patient Completed: Chest;Right Arm;Left Arm;Abdomen;Right upper leg;Left upper leg Bathing: 3: Mod-Patient completes 5-7 59f 10 parts or 50-74%  FIM - Upper Body Dressing/Undressing Upper body dressing/undressing: 0: Wears gown/pajamas-no public clothing FIM - Lower Body Dressing/Undressing Lower body dressing/undressing steps patient completed: Don/Doff right sock Lower body dressing/undressing: 0: Wears gown/pajamas-no public clothing  FIM - Toileting Toileting: 0: Activity did not occur  FIM - Radio producer Devices: Recruitment consultant Transfers: 0-Activity did not occur  FIM - Control and instrumentation engineer Devices: Bed rails Bed/Chair Transfer: 3: Sit > Supine: Mod A (lifting assist/Pt. 50-74%/lift 2 legs);3: Bed > Chair or W/C: Mod A (lift or lower assist);3: Chair or W/C > Bed: Mod A (lift or lower assist)  FIM - Locomotion: Wheelchair Distance: 30' (patient reporting fatigue with limited participation) Locomotion: Wheelchair: 1: Travels less than 50 ft with maximal assistance (Pt: 25 - 49%) FIM - Locomotion: Ambulation Locomotion: Ambulation Assistive Devices: Environmental consultant - Standard Ambulation/Gait Assistance: 4: Min assist Locomotion: Ambulation: 1: Travels less than 50 ft with minimal assistance (Pt.>75%)  Comprehension Comprehension Mode:  Auditory Comprehension: 5-Follows basic conversation/direction: With no assist  Expression  Expression Mode: Verbal Expression: 5-Expresses basic needs/ideas: With extra time/assistive device  Social Interaction Social Interaction: 5-Interacts appropriately 90% of the time - Needs monitoring or encouragement for participation or interaction.  Problem Solving Problem Solving: 5-Solves basic problems: With no assist  Memory Memory: 5-Recognizes or recalls 90% of the time/requires cueing < 10% of the time  1. 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 DVT Prophylaxis/Anticoagulation: Subcutaneous Lovenox. Monitor platelet counts and any signs of bleeding  3. Pain Management: Oxycodone as needed. Monitor mental status. Stable at present  -reassured her that some pain is to be expected still at this point 4. Postoperative atrial fibrillation. Patient converted to normal sinus rhythm. Followup cardiology services  5. Congestive heart failure. Weigh patient daily and monitor for any signs of fluid overload.- exam clear  6. Chronic renal insufficiency. Baseline creatinine 1.9. Strict I&O. HCTZ 25 mg daily that patient was on prior to admission held 06/14/2011 due to increasing renal function. Continue to hold given loose stool. 7. Hypertension. Norvasc 10 mg daily, Coreg 25 mg twice daily  8. Hyperlipidemia. Lipitor  9. Multiple gallstones/cholelithiasis/ventral hernia. Plan followup per Gen. surgery once patient stabilized from vascular issues to consider laparoscopic cholecystectomy and hernia repair  10. Wound care-groin incisions intact. Watch incision along inguinal fold which is a bit moist  -continue allevyn dressing to AK incision-looks good 11. C Diff diarrhea- flagyl for 10 days. Sx improving already. Some nausea with med. Needs to take with  food.  -recheck wbc's on wednesday  LOS (Days) 3 A FACE TO FACE EVALUATION WAS PERFORMED  Mellina Benison T 06/18/2011, 7:24 AM

## 2011-06-18 NOTE — Consult Note (Signed)
WOC consult Note Reason for Consult: Consult requested for left groin dressing. Pt post-op from revascularization and left amputation. Wound type: Full thickness surgical wound closed with staples. Measurement:Left upper groin approx 4 cm area with staples intact, some slough between site, small red drainage, area surrounding woundbed indurated and painful.  Lower groin with approx 5 cm area with staples not approximated, some loose and hanging, eschar to area between with small tan drainage and painful to touch. RECOMMEND VVS CONSULT; since this is a post-op incision it is beyond [facility] scope of practice.  Discussed plan of care with PA and he will consult this team.  Site protected with 4X4 until further plan of care given by VVS.  Will not plan to follow further unless re-consulted.  [address] [place], MSN, [place]

## 2011-06-18 NOTE — Progress Notes (Addendum)
Inpatient [place] Individual Statement of Services  Patient Name:  Stephanie Griffith  Date:  06/18/2011  Welcome to the [facility].  Our goal is to provide you with an individualized program based on your diagnosis and situation, designed to meet your specific needs.  With this comprehensive rehabilitation program, you will be expected to participate in at least 3 hours of rehabilitation therapies Monday-Friday, with modified therapy programming on the weekends.  Your rehabilitation program will include the following services:  Physical Therapy (PT), Occupational Therapy (OT), 24 hour per day rehabilitation nursing, Therapeutic Recreaction (TR), Case Management (RN and Education officer, museum), Rehabilitation Medicine, Nutrition Services and Pharmacy Services  Weekly team conferences will be held on Tuesdays to discuss your progress.  Your RN Case Writer will talk with you frequently to get your input and to update you on team discussions.  Team conferences with you and your family in attendance may also be held.  Expected length of stay: 1-2 weeks   Overall anticipated outcome: Supervision - modified independence  Depending on your progress and recovery, your program may change.  Your RN Case Engineer, production will coordinate services and will keep you informed of any changes.  Your RN Tourist information centre manager and SW names and contact numbers are listed  below.  The following services may also be recommended but are not provided by the [place] will be made to provide these services after discharge if needed.  Arrangements include referral to agencies that provide these services.  Your insurance has been verified to be:  [employer] Your primary doctor is:  Dr. Maudry Mayhew  [new]  Pertinent  information will be shared with your doctor and your insurance company.  Case Manager: Lutricia Feil, [facility] [phone]  Social Worker:  [place], [place]  Information discussed with and copy given to patient by: Flo Shanks, 06/18/2011

## 2011-06-18 NOTE — Progress Notes (Signed)
Patient's last bowel movement was 06/16/11.  Patient currently refusing a laxative and states she wants to wait until speaking with the doctor to determine if a laxative is needed.  Bowel sounds present.  Will continue to monitor.

## 2011-06-18 NOTE — Progress Notes (Signed)
Per State Regulation 482.30 This chart was reviewed for medical necessity with respect to the patient's Admission/Duration of stay. Pt participating in therapies. Incontinent of loose stools, + c-diff, placed on precautions. Nursing working on wound care and management of phantom pain.  Flo Shanks                 Nurse Care Manager            Next Review Date: 06/22/11

## 2011-06-18 NOTE — Progress Notes (Signed)
Social Work  Social Work Assessment and Plan  Patient Details  Name: Stephanie Griffith MRN: [id number] Date of Birth: 10-Sep-1945  Today's Date: 06/18/2011  Problem List:  Patient Active Problem List  Diagnoses  . ANEMIA-NOS  . Essential hypertension, benign  . Chronic kidney disease (CKD), stage III (moderate)  . CHEST PAIN, HX OF  . Dry gangrene  . Abdominal pain  . Leukocytosis  . Hyponatremia  . Hyperglycemia  . Acute on chronic renal failure  . PAD (peripheral artery disease)  . Cholelithiasis and cholecystitis without obstruction  . Ventral hernia  . Atrial fibrillation  . NSTEMI (non-ST elevated myocardial infarction)  . Acute diastolic heart failure  . S/P AKA (above knee amputation)   Past Medical History:  Past Medical History  Diagnosis Date  . Hypertension   . Hernia   . Chronic renal insufficiency     Cr 1.38-1.93 in 2009   Past Surgical History:  Past Surgical History  Procedure Date  . Tubal ligation   . Femoral-popliteal bypass graft 06/06/2011    Procedure: BYPASS GRAFT FEMORAL-POPLITEAL ARTERY;  Surgeon: Elam Dutch, MD;  Location: [facility] OR;  Service: Vascular;  Laterality: Left;  left femoral to popliteal bypass with composite vein and propaten 50mm x 80 graft  . Amputation 06/06/2011    Procedure: AMPUTATION DIGIT;  Surgeon: Elam Dutch, MD;  Location: [facility] OR;  Service: Vascular;  Laterality: Left;  Transmetatarsal amputation left great toe  . Amputation 06/12/2011    Procedure: AMPUTATION ABOVE KNEE;  Surgeon: Elam Dutch, MD;  Location: [facility] OR;  Service: Vascular;  Laterality: Left;   Social History:  reports that she quit smoking about 2 weeks ago. She has never used smokeless tobacco. She reports that she does not drink alcohol or use illicit drugs.  Family / Support Systems Marital Status: Married How Long?: 50 years Patient Roles: Spouse;Parent (grandmother ) Spouse/Significant Other: Jonquil Piere (H) [phone] Children: (pt has 7 daughters and  3 sons with all in [place] except 2) - primary contacts = daughter, Angelica Pou @ [phone] and daughter, Yuval Welding @ [phone] Other Supports: very supportive church family Anticipated Caregiver: Pt plans to d/c initially to daughter's home [facility]) with other children to assit Ability/Limitations of Caregiver: husband currently receiving treatments for liver CA and family does not wish to have him be primary caregiver.  Children do work varying shifts, therefore, plan to share 24/7/care. Caregiver Availability: 24/7 Family Dynamics: Pt states, "I rely on all my family...don't need to ask anybody else to do anything" - daughters note that all family is very involved and supportive.  Social History Preferred language: English Religion: Baptist Cultural Background: NA Education: HS Read: Yes Write: Yes Employment Status: Retired Date Retired/Disabled/Unemployed: "several years agoSecretary/administrator Issues: None Guardian/Conservator: None   Abuse/Neglect Physical Abuse: Denies Verbal Abuse: Denies Sexual Abuse: Denies Exploitation of patient/patient's resources: Denies Self-Neglect: Denies  Emotional Status Pt's affect, behavior adn adjustment status: Pleasant, talkative, fully oriented woman sitting in bed with two daughters at bedside.  Does show humor at times, but acknowledges that she had little "warning" that she would be losing her leg.  She states, "but I have my God...he said to turn to me".  Pt denies any s/s depression or anxiety.  Did not administer formal depression screen but will monitor.  Discussed general coping strategies and offered peer visits to assist with mental health - pt very interested and agreeable with this SW to arrange. Recent Psychosocial Issues:  None Pyschiatric History: None Substance Abuse History: None  Patient / Family Perceptions, Expectations & Goals Pt/Family understanding of illness & functional limitations: Pt and daughters  with general understanding of medical issues which led to AKA and of the need for CIR therapies.  Daughter did inquire about process for prosthetic training/ planning - gave general desciption of steps to be taken, yet , daughter aware that Dr. Naaman Plummer will guide this process. Premorbid pt/family roles/activities: Pt proudly reports on her roles as mother, grandmother and great-grandmother - enjoys her time with family as well as her church family. Anticipated changes in roles/activities/participation: Will initially be limited at home and community, but pt motivated to complete rehab and then to get prosthesis to resume actice lifestyle. Pt/family expectations/goals: "...all I know is that I feel like I've been beat up.Marland KitchenMarland KitchenI want to get through this pain"  [employer]: None Premorbid Home Care/DME Agencies: None Transportation available at discharge: yes Resource referrals recommended: Support group (specify);Advocacy groups (Amputee support/ peer visits)  Discharge Planning Living Arrangements: Children Support Systems: Spouse/significant other;Children;Church/faith community Type of Residence: Private residence Insurance Resources: Medicare ([place] [employer]) Financial Resources: Social Security Financial Screen Referred: No Living Expenses: Own Money Management: Patient;Spouse Do you have any problems obtaining your medications?: No Home Management: PTA was pt and husband Patient/Family Preliminary Plans: Pt to d/c to daughter's home initially where 24/7 assistance will be provided - eventually will return to her own home Social Work Anticipated Follow Up Needs: HH/OP;Support Group Expected length of stay: 7-10 days?  Clinical Impression Pleasant, oriented, motivated woman here after AKA.  Family very supportive.  Pt denies any S/S of depression or anxiety but is very interested in having a peer visit (arranged).  Anticipate short LOS. Plans to d/c to daughter's  home initially.  Lennart Pall  Halia Franey, [place] 06/18/2011, 3:29 PM

## 2011-06-18 NOTE — Progress Notes (Signed)
Patient information reviewed and entered into [facility]-PRO system by Leith Hedlund, RN, CRRN, PPS Coordinator.  Information including medical coding and functional independence measure will be reviewed and updated through discharge.     Per nursing patient was given "Data Collection Information Summary for Patients in Inpatient Rehabilitation Facilities with attached "Privacy Act Statement-Health Care Records" upon admission.   

## 2011-06-18 NOTE — Progress Notes (Signed)
Occupational Therapy Session Notes  Patient Details  Name: Stephanie Griffith MRN: [id number] Date of Birth: [birth year]  Today's Date: 06/18/2011  Short Term Goals: Week 1:  OT Short Term Goal 1 (Week 1): Pt. will dress LB at supervision level OT Short Term Goal 2 (Week 1): Pt. will bathe LB at supervision level OT Short Term Goal 3 (Week 1): Pt. will perform toilet transfer at minimal assist.   OT Short Term Goal 4 (Week 1): Pt.  will perform tub transfer at minimal assist. OT Short Term Goal 5 (Week 1): Pt. will perform simple homemaking tasks at wc level with supervision level  Skilled Therapeutic Interventions/Progress Updates:   Session #1 [id number] - 55 Minutes  Individual Therapy No complaints of pain, but patient with c/o being "weak". Upon entering room patient seated edge of bed. Encouraged patient to engage in ADL retraining at sink level; patient willing.Transferred edge of bed -> w/c with moderate assistance. Then engaged in ADL retraining in sit -> stand position in w/c. Focused skilled intervention on sit/stands, UB/LB bathing & dressing, overall activity tolerance/endurance (patient with complaints of feeling "weak" during session), pain/phantom sensation management (patient with complaints of phantom pain during session), and grooming tasks seated in w/c at sink. Left patient seated in w/c beside bed with breakfast tray and phone & call bell within reach.   Session #2 1030-1100 - 30 Minutes Individual Therapy No complaints of pain Engaged in toileting and toilet transfer <-> drop arm BSC; w/c stand pivot transfers using grab bars prn. Patient stood and doffed pants while holding onto grab bars. After toileting, engaged in bed mobility to get back to bed. Therapist noticed some discharge inside pants; notified nursing of open area on left thigh and greenish discharge on dressing around groin area. Two daughters present during session.   Precautions:  Precautions Precautions:  Fall Required Braces or Orthoses: No Restrictions Weight Bearing Restrictions: Yes RUE Weight Bearing: Weight bearing as tolerated LUE Weight Bearing: Weight bearing as tolerated RLE Weight Bearing: Weight bearing as tolerated LLE Weight Bearing: Non weight bearing Other Position/Activity Restrictions:  L AKA  Vital Signs: Therapy Vitals Temp: 98.1 F (36.7 C) Temp src: Oral Pulse Rate: 80  Resp: 18  BP: 122/82 mmHg Patient Position, if appropriate: Sitting Oxygen Therapy SpO2: 100 % O2 Device: None (Room air)  See FIM for current functional status  [name] 06/18/2011, 8:11 AM

## 2011-06-19 MED ORDER — SENNOSIDES-DOCUSATE SODIUM 8.6-50 MG PO TABS
2.0000 | ORAL_TABLET | Freq: Every day | ORAL | Status: DC
  Administered 2011-06-19 – 2011-06-25 (×7): 2 via ORAL
  Filled 2011-06-19: qty 1
  Filled 2011-06-19 (×4): qty 2
  Filled 2011-06-19: qty 1
  Filled 2011-06-19 (×2): qty 2

## 2011-06-19 NOTE — Progress Notes (Signed)
Recreational Therapy Session Note  Patient Details  Name: Stephanie Griffith MRN: [id number] Date of Birth: November 25, 1945 Today's Date: 06/19/2011  Order received, chart reviewed, full eval to follow. Niv Darley 06/19/2011, 4:09 PM

## 2011-06-19 NOTE — Plan of Care (Signed)
Problem: RH BOWEL ELIMINATION Goal: RH STG MANAGE BOWEL W/MEDICATION W/ASSISTANCE STG Manage Bowel with Medication with Assistance.  Patient refused medical intervention, No BM since 06-16-11

## 2011-06-19 NOTE — Plan of Care (Signed)
Problem: RH SKIN INTEGRITY Goal: RH STG SKIN FREE OF INFECTION/BREAKDOWN Supervision  Patient has purulent drainage from the femoral incision.

## 2011-06-19 NOTE — Progress Notes (Signed)
Occupational Therapy Session Note  Patient Details  Name: Stephanie Griffith MRN: [id number] Date of Birth: 08-26-1945  Today's Date: 06/19/2011 Time: 1300-1330 Time Calculation (min): 30 min  Short Term Goals: Week 1:  OT Short Term Goal 1 (Week 1): Pt. will dress LB at supervision level OT Short Term Goal 2 (Week 1): Pt. will bathe LB at supervision level OT Short Term Goal 3 (Week 1): Pt. will perform toilet transfer at minimal assist.   OT Short Term Goal 4 (Week 1): Pt.  will perform tub transfer at minimal assist. OT Short Term Goal 5 (Week 1): Pt. will perform simple homemaking tasks at wc level with supervision level  Skilled Therapeutic Interventions/Progress Updates:   No complaints of pain Upon entering room patient seated edge of bed eating lunch. Patient stated she felt better, no more stomach ache. Engaged in edge of bed -> w/c transfer and w/c -> drop arm BSC seated over toilet seat. No luck with BM. Patient then transferred back to w/c for grooming tasks seated at sink in w/c. Patient changed shirt and hospital gown in seated position. Encouraged patient to sit in w/c and out of bed for awhile; patient agreeable. Left patient in w/c seated beside bed with call bell and phone within reach. Family and friends in room visiting.    Precautions:  Precautions Precautions: Fall Required Braces or Orthoses: No Restrictions Weight Bearing Restrictions: Yes RUE Weight Bearing: Weight bearing as tolerated LUE Weight Bearing: Weight bearing as tolerated RLE Weight Bearing: Weight bearing as tolerated LLE Weight Bearing: Non weight bearing Other Position/Activity Restrictions:  L AKA  See FIM for current functional status  Therapy/Group: Individual Therapy  [name] 06/19/2011, 2:46 PM

## 2011-06-19 NOTE — Progress Notes (Signed)
Physical Therapy Note  Patient Details  Name: Stephanie Griffith MRN: [id number] Date of Birth: November 24, 1945 Today's Date: 06/19/2011  Pt missed 45 minutes of skilled PT due to refusal. Upon entering room pt talking on telephone. Once talking to therapist stated she couldn't do therapy and didn't feel well. Pt refused to get OOB or attempt any therapy until she has had a bowel movement. Encouraged pt that moving during therapy can assist with this and reminded her that laying in bed is not going to help her get home. Pt again refused stating she would try again at our scheduled 315pm session. Notified RN of refusal.   [name] [facility] 06/19/2011, 11:28 AM

## 2011-06-19 NOTE — Progress Notes (Signed)
Occupational Therapy Note  Patient Details  Name: Whitni Mcwhirt MRN: [id number] Date of Birth: [birth year] Today's Date: 06/19/2011  Patient missed 60 minutes of skilled occupational therapy secondary to refusal, secondary to complaints of nausea and feeling weak. Upon entering room patient supine in bed. Therapist tried engaging patient in conversation, but patient kept eyes shut and mumbled words. Patient did have complaints of "my stomach is sore and I'm weak". Offered patient ginger ale and crackers; patient agreeable to ginger ale but stated "I don't want any crackers!!". Gave patient ginger ale and notified RN of refusal and behavior.   Brynda Heick 06/19/2011, 10:18 AM

## 2011-06-19 NOTE — Progress Notes (Signed)
Physical Therapy Session Note  Patient Details  Name: Stephanie Griffith MRN: [id number] Date of Birth: [birth year]  Today's Date: 06/19/2011 Time: [id number] Time Calculation (min): 40 min  Initially pt declined therapy. With encouragement, pt agreeable to get OOB into the recliner. Supine to sit with supervision using bed rails. Mod A stand pivot transfer with RW, initially 2 attempts to get into standing, cueing for technique and safety. Pt apprehensive with RW but grandson held walker during exercises to stabilize for security of pt. With encouragement, pt agreeable to do some exercise with RLE 3 # ankle weight for LAQ and marches x 10 reps x 2 sets. Sit to stands for functional strengthening with RW and hip abduction, flexion and extension x 10 reps each x 2 sets. After convincing, pt enjoyed exercises and wanted to them again tomorrow but declined any further therapy. (Missed last 20 minutes).     Therapy Documentation Precautions:  Precautions Precautions: Fall Required Braces or Orthoses: No Restrictions Weight Bearing Restrictions: Yes RUE Weight Bearing: Weight bearing as tolerated LUE Weight Bearing: Weight bearing as tolerated RLE Weight Bearing: Weight bearing as tolerated LLE Weight Bearing: Non weight bearing Other Position/Activity Restrictions:  L AKA  Pain: No complaints.  See FIM for current functional status  Therapy/Group: Individual Therapy  [name] [facility] 06/19/2011, 3:46 PM

## 2011-06-19 NOTE — Progress Notes (Signed)
Patient ID: Stephanie Griffith, female   DOB: December 23, 1945, 66 y.o.   MRN: [id number] Subjective/Complaints: Review of Systems  Gastrointestinal: Positive for diarrhea.  Musculoskeletal: Positive for joint pain.  All other systems reviewed and are negative.  3/19- states she didn't sleep well, but unclear as to why   Objective: Vital Signs: Blood pressure 117/73, pulse 76, temperature 98.7 F (37.1 C), temperature source Oral, resp. rate 18, height 5\' 8"  (1.727 m), weight 82.9 kg (182 lb 12.2 oz), SpO2 100.00%. No results found.  Basename 06/18/11 0645  WBC 19.2*  HGB 8.6*  HCT 26.2*  PLT 769*    Basename 06/18/11 0645 06/17/11 0650  NA 132* 131*  K 4.1 4.1  CL 96 96  CO2 25 26  GLUCOSE 134* 133*  BUN 32* 33*  CREATININE 1.63* 1.75*  CALCIUM 8.7 8.7   CBG (last 3)  No results found for this basename: GLUCAP:3 in the last 72 hours  Wt Readings from Last 3 Encounters:  06/19/11 82.9 kg (182 lb 12.2 oz)  06/15/11 93.1 kg (205 lb 4 oz)  06/15/11 93.1 kg (205 lb 4 oz)    Physical Exam:  General appearance: alert, cooperative and no distress Head: Normocephalic, without obvious abnormality, atraumatic Eyes: conjunctivae/corneas clear. PERRL, EOM's intact. Fundi benign. Ears: normal TM's and external ear canals both ears Nose: Nares normal. Septum midline. Mucosa normal. No drainage or sinus tenderness. Throat: lips, mucosa, and tongue normal; teeth and gums normal Neck: no adenopathy, no carotid bruit, no JVD, supple, symmetrical, trachea midline and thyroid not enlarged, symmetric, no tenderness/mass/nodules Back: symmetric, no curvature. ROM normal. No CVA tenderness. Resp: clear to auscultation bilaterally Cardio: regular rate and rhythm, S1, S2 normal, no murmur, click, rub or gallop GI: soft, non-tender; bowel sounds normal; no masses,  no organomegaly-large umbilical hernia Extremities: extremities normal, atraumatic, no cyanosis or edema Pulses: 2+ and symmetric Skin:  Skin color, texture, turgor normal. No rashes or lesions Neurologic: Strength symmetrical. A little anxious and impulsive but otherwise at cognitive baseline. Sensory exam grossly intact.  Incision/Wound: wounds all clean without significant drainage at amp site. Mild drainage at upper groin site, but a lot of this is just moisture from skin folds 3/19  Assessment/Plan: 1. 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 Physiatrist is providing close team supervision and 24 hour management of active medical problems listed below. Physiatrist and rehab team continue to assess barriers to discharge/monitor patient progress toward functional and medical goals. FIM: FIM - Bathing Bathing Steps Patient Completed: Chest;Right Arm;Left Arm;Abdomen;Front perineal area;Buttocks;Right upper leg;Left upper leg;Right lower leg (including foot) Bathing: 4: Steadying assist  FIM - Upper Body Dressing/Undressing Upper body dressing/undressing steps patient completed: Thread/unthread left sleeve of pullover shirt/dress;Put head through opening of pull over shirt/dress;Pull shirt over trunk;Thread/unthread right sleeve of pullover shirt/dresss Upper body dressing/undressing: 5: Set-up assist to: Obtain clothing/put away FIM - Lower Body Dressing/Undressing Lower body dressing/undressing steps patient completed: Thread/unthread right pants leg;Thread/unthread left pants leg;Don/Doff right sock Lower body dressing/undressing: 4: Min-Patient completed 75 plus % of tasks  FIM - Toileting Toileting: 0: Activity did not occur  FIM - Radio producer Devices: Recruitment consultant Transfers: 4-To toilet/BSC: Min A (steadying Pt. > 75%);4-From toilet/BSC: Min A (steadying Pt. > 75%)  FIM - Bed/Chair Transfer Bed/Chair Transfer Assistive Devices: Bed rails Bed/Chair Transfer: 3: Bed >  Chair or W/C: Mod A (lift or lower assist)  FIM - Locomotion: Wheelchair Distance: 30' (patient reporting fatigue with limited participation) Locomotion: Wheelchair: 1: Travels less than 50 ft with minimal assistance (Pt.>75%) FIM - Locomotion: Ambulation Locomotion: Ambulation Assistive Devices: Administrator Ambulation/Gait Assistance: 4: Min assist Locomotion: Ambulation: 1: Travels less than 50 ft with minimal assistance (Pt.>75%)  Comprehension Comprehension Mode: Auditory Comprehension: 5-Understands basic 90% of the time/requires cueing < 10% of the time  Expression Expression Mode: Verbal Expression: 5-Expresses basic 90% of the time/requires cueing < 10% of the time.  Social Interaction Social Interaction: 5-Interacts appropriately 90% of the time - Needs monitoring or encouragement for participation or interaction.  Problem Solving Problem Solving: 5-Solves basic 90% of the time/requires cueing < 10% of the time  Memory Memory: 5-Recognizes or recalls 90% of the time/requires cueing < 10% of the time  1. 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 Need to keep upper wound a little dryer d/t fact it's in the skin folds. 2. DVT Prophylaxis/Anticoagulation: Subcutaneous Lovenox. Monitor platelet counts and any signs of bleeding  3. Pain Management: Oxycodone as needed. Monitor mental status. Stable at present  -reassured her that some pain is to be expected still at this point 4. Postoperative atrial fibrillation. Patient converted to normal sinus rhythm. Followup cardiology services  5. Congestive heart failure. Weigh patient daily and monitor for any signs of fluid overload.- exam clear  6. Chronic renal insufficiency. Baseline creatinine 1.9. Strict I&O. HCTZ 25 mg daily that patient was on prior to  admission held 06/14/2011 due to increasing renal function. Weight down overall. 7. Hypertension. Norvasc 10 mg daily, Coreg 25 mg twice daily  8. Hyperlipidemia. Lipitor  9. Multiple gallstones/cholelithiasis/ventral hernia. Plan followup per Gen. surgery once patient stabilized from vascular issues to consider laparoscopic cholecystectomy and hernia repair  10. Wound care-groin incisions intact. Watch incision along inguinal fold which is a bit moist still -continue allevyn dressing to AK incision-looks good 11. C Diff diarrhea- flagyl for 10 days. Sx improving already. Some nausea with med. Needs to take with food.  -recheck wbc's this week.  -resume softener/lax tonight  LOS (Days) 4 A FACE TO FACE EVALUATION WAS PERFORMED  Menna Abeln T 06/19/2011, 7:06 AM

## 2011-06-20 DIAGNOSIS — A0472 Enterocolitis due to Clostridium difficile, not specified as recurrent: Secondary | ICD-10-CM

## 2011-06-20 DIAGNOSIS — I70269 Atherosclerosis of native arteries of extremities with gangrene, unspecified extremity: Secondary | ICD-10-CM

## 2011-06-20 DIAGNOSIS — Z5189 Encounter for other specified aftercare: Secondary | ICD-10-CM

## 2011-06-20 DIAGNOSIS — N289 Disorder of kidney and ureter, unspecified: Secondary | ICD-10-CM

## 2011-06-20 DIAGNOSIS — S78119A Complete traumatic amputation at level between unspecified hip and knee, initial encounter: Secondary | ICD-10-CM

## 2011-06-20 LAB — CBC
HCT: 27.1 % — ABNORMAL LOW (ref 36.0–46.0)
Hemoglobin: 8.7 g/dL — ABNORMAL LOW (ref 12.0–15.0)
MCH: 28.3 pg (ref 26.0–34.0)
MCHC: 32.1 g/dL (ref 30.0–36.0)
MCV: 88.3 fL (ref 78.0–100.0)
Platelets: 814 10*3/uL — ABNORMAL HIGH (ref 150–400)
RBC: 3.07 MIL/uL — ABNORMAL LOW (ref 3.87–5.11)
RDW: 16.1 % — ABNORMAL HIGH (ref 11.5–15.5)
WBC: 12.4 10*3/uL — ABNORMAL HIGH (ref 4.0–10.5)

## 2011-06-20 LAB — DIFFERENTIAL
Basophils Absolute: 0.1 10*3/uL (ref 0.0–0.1)
Basophils Relative: 1 % (ref 0–1)
Eosinophils Absolute: 0.4 10*3/uL (ref 0.0–0.7)
Eosinophils Relative: 3 % (ref 0–5)
Lymphocytes Relative: 20 % (ref 12–46)
Lymphs Abs: 2.5 10*3/uL (ref 0.7–4.0)
Monocytes Absolute: 2 10*3/uL — ABNORMAL HIGH (ref 0.1–1.0)
Monocytes Relative: 16 % — ABNORMAL HIGH (ref 3–12)
Neutro Abs: 7.4 10*3/uL (ref 1.7–7.7)
Neutrophils Relative %: 60 % (ref 43–77)

## 2011-06-20 MED ORDER — COLLAGENASE 250 UNIT/GM EX OINT
1.0000 "application " | TOPICAL_OINTMENT | Freq: Every day | CUTANEOUS | Status: DC
  Administered 2011-06-20 – 2011-06-25 (×5): 1 via TOPICAL
  Filled 2011-06-20: qty 30

## 2011-06-20 MED ORDER — BISACODYL 10 MG RE SUPP: 10.0000 mg | Freq: Every day | RECTAL | Status: DC | PRN

## 2011-06-20 MED ORDER — ASPIRIN 81 MG PO CHEW
CHEWABLE_TABLET | ORAL | Status: AC
  Filled 2011-06-20: qty 1

## 2011-06-20 NOTE — Progress Notes (Signed)
Subjective/Complaints: Review of Systems  Gastrointestinal: Positive for constipation.  Musculoskeletal: Positive for joint pain.  All other systems reviewed and are negative.  3/20-some nausea yesterday. Feeling better today. Has had gas but no bm   Objective: Vital Signs: Blood pressure 135/72, pulse 67, temperature 98.7 F (37.1 C), temperature source Oral, resp. rate 18, height 5\' 8"  (1.727 m), weight 82.8 kg (182 lb 8.7 oz), SpO2 97.00%. No results found.  Basename 06/20/11 0632 06/18/11 0645  WBC 12.4* 19.2*  HGB 8.7* 8.6*  HCT 27.1* 26.2*  PLT 814* 769*    Basename 06/18/11 0645  NA 132*  K 4.1  CL 96  CO2 25  GLUCOSE 134*  BUN 32*  CREATININE 1.63*  CALCIUM 8.7   CBG (last 3)  No results found for this basename: GLUCAP:3 in the last 72 hours  Wt Readings from Last 3 Encounters:  06/20/11 82.8 kg (182 lb 8.7 oz)  06/15/11 93.1 kg (205 lb 4 oz)  06/15/11 93.1 kg (205 lb 4 oz)    Physical Exam:  General appearance: alert, cooperative and no distress Head: Normocephalic, without obvious abnormality, atraumatic Eyes: conjunctivae/corneas clear. PERRL, EOM's intact. Fundi benign. Ears: normal TM's and external ear canals both ears Nose: Nares normal. Septum midline. Mucosa normal. No drainage or sinus tenderness. Throat: lips, mucosa, and tongue normal; teeth and gums normal Neck: no adenopathy, no carotid bruit, no JVD, supple, symmetrical, trachea midline and thyroid not enlarged, symmetric, no tenderness/mass/nodules Back: symmetric, no curvature. ROM normal. No CVA tenderness. Resp: clear to auscultation bilaterally Cardio: regular rate and rhythm, S1, S2 normal, no murmur, click, rub or gallop GI: soft, non-tender; bowel sounds normal; no masses,  no organomegaly-large umbilical hernia Extremities: extremities normal, atraumatic, no cyanosis or edema Pulses: 2+ and symmetric Skin: Skin color, texture, turgor normal. No rashes or lesions Neurologic:  Strength symmetrical. A little anxious and impulsive but otherwise at cognitive baseline. Sensory exam grossly intact.  Incision/Wound: wounds all clean without significant drainage at amp site. Mild/decreased drainage at upper groin site, inquinal area is just moist due to skin folds 3/20 exam  Assessment/Plan: 1. 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 Physiatrist is providing close team supervision and 24 hour management of active medical problems listed below. Physiatrist and rehab team continue to assess barriers to discharge/monitor patient progress toward functional and medical goals. FIM: FIM - Bathing Bathing Steps Patient Completed: Chest;Right Arm;Left Arm;Abdomen;Front perineal area;Buttocks;Right upper leg;Left upper leg;Right lower leg (including foot) Bathing: 4: Steadying assist  FIM - Upper Body Dressing/Undressing Upper body dressing/undressing steps patient completed: Thread/unthread right sleeve of pullover shirt/dresss;Thread/unthread left sleeve of pullover shirt/dress;Put head through opening of pull over shirt/dress;Pull shirt over trunk Upper body dressing/undressing: 5: Set-up assist to: Obtain clothing/put away FIM - Lower Body Dressing/Undressing Lower body dressing/undressing steps patient completed: Thread/unthread right pants leg;Thread/unthread left pants leg;Don/Doff right sock Lower body dressing/undressing: 4: Min-Patient completed 75 plus % of tasks  FIM - Toileting Toileting steps completed by patient: Adjust clothing prior to toileting;Performs perineal hygiene;Adjust clothing after toileting Toileting: 5: Supervision: Safety issues/verbal cues  FIM - Air cabin crew Transfers Assistive Devices: Bedside commode (drop arm) Toilet Transfers: 4-To toilet/BSC: Min A (steadying Pt. > 75%);4-From toilet/BSC: Min A (steadying Pt. > 75%)  FIM -  Bed/Chair Transfer Bed/Chair Transfer Assistive Devices: Bed rails;Arm rests;Walker Bed/Chair Transfer: 5: Supine > Sit: Supervision (verbal cues/safety issues);3: Bed > Chair or W/C:  Mod A (lift or lower assist) (bed -> recliner mod A with RW)  FIM - Locomotion: Wheelchair Distance: 30' (patient reporting fatigue with limited participation) Locomotion: Wheelchair: 1: Travels less than 50 ft with minimal assistance (Pt.>75%) FIM - Locomotion: Ambulation Locomotion: Ambulation Assistive Devices: Administrator Ambulation/Gait Assistance: 4: Min assist Locomotion: Ambulation: 1: Travels less than 50 ft with minimal assistance (Pt.>75%)  Comprehension Comprehension Mode: Auditory Comprehension: 5-Understands complex 90% of the time/Cues < 10% of the time  Expression Expression Mode: Verbal Expression: 5-Expresses complex 90% of the time/cues < 10% of the time  Social Interaction Social Interaction: 5-Interacts appropriately 90% of the time - Needs monitoring or encouragement for participation or interaction.  Problem Solving Problem Solving: 5-Solves complex 90% of the time/cues < 10% of the time  Memory Memory: 5-Recognizes or recalls 90% of the time/requires cueing < 10% of the time  1. 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 Need to keep upper wound a little dryer d/Griffith fact it's in the skin folds. 2. DVT Prophylaxis/Anticoagulation: Subcutaneous Lovenox. Monitor platelet counts and any signs of bleeding  3. Pain Management: Oxycodone as needed. Monitor mental status. Stable at present  -reassured her that some pain is to be expected still at this point 4. Postoperative atrial fibrillation. Patient converted to normal sinus rhythm. Followup cardiology services  5. Congestive heart failure. Weigh patient daily and  monitor for any signs of fluid overload.- exam clear  6. Chronic renal insufficiency. Baseline creatinine 1.9. Strict I&O. HCTZ 25 mg daily that patient was on prior to admission held 06/14/2011 due to increasing renal function. Weight down overall. 7. Hypertension. Norvasc 10 mg daily, Coreg 25 mg twice daily  8. Hyperlipidemia. Lipitor  9. Multiple gallstones/cholelithiasis/ventral hernia. Plan followup per Gen. surgery once patient stabilized from vascular issues to consider laparoscopic cholecystectomy and hernia repair  10. Wound care-groin incisions intact. Watch incision along inguinal fold which is a bit moist still -continue allevyn dressing to AK incision-looks good 11. C Diff diarrhea- flagyl for 10 days. Sx improving already. Some nausea with med. Needs to take with food.  -wbc's have decreased  -try miralax today for constipation  LOS (Days) 5 A FACE TO FACE EVALUATION WAS PERFORMED  Stephanie Griffith 06/20/2011, 7:26 AM

## 2011-06-20 NOTE — Progress Notes (Signed)
pts upper thigh inc appears to be dehisced some staples still intact, purulent drainage present. Inc to groin also not approximated and contains purulent drainage. Pt denies pain to these areas.  Inc to stump appears well approx w/staples intact. Will notify md.

## 2011-06-20 NOTE — Progress Notes (Signed)
Social Work Patient ID: Stephanie Griffith, female   DOB: 1946/01/17, 66 y.o.   MRN: [id number]   Pt and daughter's agreeable with targeted d/c 06/26/11.  Per PT recommendation, I  have ordered a lightweight w/c for pt as she is unable to be independent and self-propel in standard weight w/c but will be independent in lightweight model.  I have also spoken with pt and daughters about need for pt to have a primary care physician - all agreeable and request that I assist them with this search process.  I have secured Dr. [name] as her new primary care MD.  Continue to follow for further d/c planning needs.  Stephanie Griffith

## 2011-06-20 NOTE — Progress Notes (Signed)
Physical Therapy Session Note  Patient Details  Name: Stephanie Griffith MRN: [id number] Date of Birth: 05-May-1945  Today's Date: 06/20/2011 Time: 0830-0928 Time Calculation (min): 58 min  Skilled Therapeutic Interventions/Progress Updates: Min A stand pivot from bed to w/c with cueing for technique and required assist with set up. W/c propulsion to gym with min A for doorways, for general strengthening and  mobility training. Worked on transfer training squat pivot from w/c to/from mat with focus on pt setting up w/c parts and correct technique, close supervision with transfer. Lower extremity therex for bilateral lower extremitie: R LAQ, heel slides, hip abduction, and seated marches with 3 # ankle weight x 10 reps x 2 sets each. L residual limb flexion, abduction, and sidelying extension x 10 reps x 2 sets each. Handout given for HEP.      Therapy Documentation Precautions:  Precautions Precautions: Fall Required Braces or Orthoses: No Restrictions Weight Bearing Restrictions: Yes RUE Weight Bearing: Weight bearing as tolerated LUE Weight Bearing: Weight bearing as tolerated RLE Weight Bearing: Weight bearing as tolerated LLE Weight Bearing: Non weight bearing Other Position/Activity Restrictions:  L AKA Pain:  No complaints  See FIM for current functional status  Therapy/Group: Individual Therapy  [name] [facility] 06/20/2011, 9:29 AM

## 2011-06-20 NOTE — Progress Notes (Signed)
Physical Therapy Session Note  Patient Details  Name: Stephanie Griffith MRN: [id number] Date of Birth: [birth year]  Today's Date: 06/20/2011 Time: [id number] Time Calculation (min): 40 min   Skilled Therapeutic Interventions/Progress Updates: Pt complaining of being tired and not wanting to participate but with encouragement, completed session with multiple rest breaks. Sit to stand with mod A , gait x 10' with min A - very shuffled pattern and minimal clearance of R foot off the floor despite cueing to push up through UEs (pt reports they are too weak). Sit to stands x 5 reps for functional strengthening with emphasis on technique and eccentric control, mod to min A overall. W/c propulsion back to room with intermittent min A needed for turning for general UE strengthening. Min A transfer squat pivot back to bed and supervision to return supine with HOB elevated.      Therapy Documentation Precautions:  Precautions Precautions: Fall Required Braces or Orthoses: No Restrictions Weight Bearing Restrictions: Yes RUE Weight Bearing: Weight bearing as tolerated LUE Weight Bearing: Weight bearing as tolerated RLE Weight Bearing: Weight bearing as tolerated LLE Weight Bearing: Non weight bearing Other Position/Activity Restrictions:  L AKA Denies pain  Locomotion : Ambulation Ambulation/Gait Assistance: 4: Min assist    See FIM for current functional status  Therapy/Group: Individual Therapy and Co-Treatment with Recreational therapy  [name] [facility] 06/20/2011, 4:17 PM

## 2011-06-20 NOTE — Evaluation (Signed)
Recreational Therapy Assessment and Plan  Patient Details  Name: Stephanie Griffith MRN: [id number] Date of Birth: [birth year] Today's Date: 06/20/2011  Rehab Potential: Good ELOS:     Assessment  Clinical Impression: 66 year old right-handed female with history of hypertension chronic kidney disease stage III and baseline creatinine of 1.9 as well as recent left big toe infection with toenail removal by podiatry and antibiotic use as an outpatient. Admitted February 28 with nausea as well abdominal pain and white blood cell count of 17,000 and creatinine 2.97. Patient with dry gangrene of left lower extremity superimposed cellulitis and placed on intravenous antibiotics. Underwent right femoral to below knee popliteal bypass March 6 by Dr. 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 There was some question of possible fluid overload by chest x-ray as weights had slightly increased. Patient was placed for a short time on intravenous Lasix 40 mg twice daily with close monitoring of renal function. In regards to her initial abdominal pain and nausea a CT of the abdomen and pelvis was completed showing multiple gallstones and some thickening of the gallbladder wall as well as ventral hernia. General surgery Dr. Excell Seltzer was consulted and noted planned for laparoscopic cholecystectomy and repair of ventral hernia once patient medically stable from vascular issues. 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 Oneida Alar. Postoperative pain management. Maintained on subcutaneous lovenox for DVT prophylaxis. M.D. has requested physical medicine rehabilitation consult. Patient transferred to CIR on 06/15/2011 .   Met with pt and discussed leisure interests. Pt with little to no interest in TR services, therefore no formal TR treatment plan implemented.   Will continue to monitor through team.  Leisure  History/Participation Premorbid leisure interest/current participation: Sports - Basketball;Sports - Other (Comment);Petra [place] - Flower gardening;Community - Shopping mall;Community - Grocery store Expression Interests: Music (Comment);Singing Other Leisure Interests: Television Leisure Participation Style: With Family/Friends Awareness of Community Resources: Good-identify 3 post discharge leisure resources Psychosocial / Spiritual Spiritual Interests: Church Does patient have pets?: No (helps take care of son's 2 dogs) Social interaction - Mood/Behavior: Cooperative Engineer, drilling for Education?: Yes Recreational Therapy Orientation Orientation -Reviewed with patient: Available activity resources Strengths/Weaknesses Patient Strengths/Abilities: Willingness to participate Patient weaknesses: Physical limitations  Plan Rec Therapy Plan Is patient appropriate for Therapeutic Recreation?: No Rehab Potential: Good  Recommendations for other services: None  Discharge Criteria: Patient will be discharged from TR if patient refuses treatment 3 consecutive times without medical reason.  If treatment goals not met, if there is a change in medical status, if patient makes no progress towards goals or if patient is discharged from hospital.  The above assessment, treatment plan, treatment alternatives and goals were discussed and mutually agreed upon: by patient  [place] 06/20/2011, 2:51 PM

## 2011-06-20 NOTE — Progress Notes (Signed)
Occupational Therapy Session Notes  Patient Details  Name: Stephanie Griffith MRN: [id number] Date of Birth: Jun 12, 1945  Today's Date: 06/20/2011  Short Term Goals: Week 1:  OT Short Term Goal 1 (Week 1): Pt. will dress LB at supervision level OT Short Term Goal 2 (Week 1): Pt. will bathe LB at supervision level OT Short Term Goal 3 (Week 1): Pt. will perform toilet transfer at minimal assist.   OT Short Term Goal 4 (Week 1): Pt.  will perform tub transfer at minimal assist. OT Short Term Goal 5 (Week 1): Pt. will perform simple homemaking tasks at wc level with supervision level  Skilled Therapeutic Interventions/Progress Updates:   Session #1 1030-1100 - 30 Minutes Amount of Missed OT Time (min): 30 Minutes Individual Therapy No complaints of pain Upon entering room patient seated edge of bed. Engaged in edge of bed -> w/c stand pivot transfer with min assist from therapist. Focused skilled intervention on UB/LB bathing in sit -> stand position, UB dressing, grooming tasks seated at sink in w/c, and increasing overall activity tolerance/endurance. Patient refused pants, requesting hospital gowns. After bathing & dressing patient refused any other therapy at this time, even after encouragement. After much encouragement patient agreeable to stay seated in w/c. Left patient seated in w/c beside bed with call bell & phone within reach.   Session #2 [id number] - 30 Minutes Individual Therapy No complaints of pain Engaged in bed mobility, edge of bed -> w/c stand pivot transfer, w/c propulsion/maneuvering, and UE exercises using SCIFIT in therapy gym. Therapeutic exercise focusing on UE/UB strengthening, endurance, and ROM AND overall activity tolerance/endurance. Patient required encouragement to engage in therapy session.   Precautions:  Precautions Precautions: Fall Required Braces or Orthoses: No Restrictions Weight Bearing Restrictions: Yes RUE Weight Bearing: Weight bearing as  tolerated LUE Weight Bearing: Weight bearing as tolerated RLE Weight Bearing: Weight bearing as tolerated LLE Weight Bearing: Non weight bearing Other Position/Activity Restrictions:  L AKA  See FIM for current functional status  [name] 06/20/2011, 11:21 AM

## 2011-06-20 NOTE — Progress Notes (Signed)
Asked to see pt for drainage left leg incision  Left AKA incision healing well Left medial thigh some posterior skin necrosis overall wound fairly superficial with fibrinous exudate Left groin macerated skin with some fibrinous exudate  Remove staples left thigh Start wound care left thigh with santyl Left groin NS wet to dry TID Wash all wounds with soap and water daily Will recheck later this week.  [name], MD [facility] Office: [phone] Pager: [phone]

## 2011-06-20 NOTE — Patient Care Conference (Signed)
Inpatient RehabilitationTeam Conference Note Date: 06/19/2011   Time: 2:00 PM    Patient Name: Stephanie Griffith      Medical Record Number: [id number]  Date of Birth: 1945/08/20 Sex: Female         Room/Bed: 4007/4007-01 Payor Info: Payor: Theme park manager MEDICARE  Plan: AARP MEDICARE COMPLETE  Product Type: *No Product type*     Admitting Diagnosis: L AKA  Admit Date/Time:  06/15/2011  3:17 PM Admission Comments: No comment available   Primary Diagnosis:  S/P AKA (above knee amputation) Principal Problem: S/P AKA (above knee amputation)  Patient Active Problem List  Diagnoses Date Noted  . S/P AKA (above knee amputation) 06/15/2011  . NSTEMI (non-ST elevated myocardial infarction) 06/09/2011  . Acute diastolic heart failure [id number]  . Atrial fibrillation 06/08/2011  . Cholelithiasis and cholecystitis without obstruction 06/03/2011  . Ventral hernia 06/03/2011  . Acute on chronic renal failure 06/02/2011  . PAD (peripheral artery disease) 06/02/2011  . Dry gangrene 05/31/2011  . Abdominal pain 05/31/2011  . Leukocytosis 05/31/2011  . Hyponatremia 05/31/2011  . Hyperglycemia 05/31/2011  . ANEMIA-NOS 12/22/2007  . Chronic kidney disease (CKD), stage III (moderate) 12/22/2007  . CHEST PAIN, HX OF 12/22/2007  . Essential hypertension, benign 12/10/2007    Expected Discharge Date: Expected Discharge Date: 06/26/11  Team Members Present: Physician: Dr. Alger Simons Case Manager Present: Lutricia Feil, RN Social Worker Present: Lennart Pall, LCSW Nurse Present: Janyth Pupa, RN PT Present: [name], PT OT Present: Chrys Racer, OT Other (Discipline and Name): Daiva Nakayama, PPS Coordinator     Current Status/Progress Goal Weekly Team Focus  Medical   Patient with multiple pain complaints. Wounds are showing improvement. C. difficile diarrhea has resolved  Wound care and pain control.  Reassure regarding multiple somatic complaints   Bowel/Bladder   cont. of bowel and  bladder  cont. of bowel and bladder  monitor   Swallow/Nutrition/ Hydration             ADL's   Overall min assist for ADLs and min-mod assist for transfers  Overall supervision -> modified independent level  ADL retraining, sit/stands, dynamic standing balance/tolerance, increasing overall activity tolerance/endurance   Mobility   min/mod A overall  supervision transfers, min A short distance gait, mod I w/c mobility  functional strengthening, gait training, dynamic balance   Communication             Safety/Cognition/ Behavioral Observations            Pain   pain managed with oxycodone 10mg  prn  less than 3  monitor frequency of pain requests   Skin   left inner thigh incision area with darkened area with staple dislodged,  left groin area with green drainage - moderate amount.  Left residual limb drsg clean, dry , and intact.   no additional skin breakdown or infection.   monitor for s/s of infection and appropriate healing      *See Interdisciplinary Assessment and Plan and progress notes for long and short-term goals  Barriers to Discharge: Pain and safety awareness    Possible Resolutions to Barriers:  Supervision at home and therapeutic education    Discharge Planning/Teaching Needs:  Home initially to daughter's home where all local children to share in providing 24/7 assistance      Team Discussion: Cdiff resolving.  Wound care to groin.  Needs encouragement for participation.   Revisions to Treatment Plan: none    Continued Need for Acute Rehabilitation Level of Care:  The patient requires daily medical management by a physician with specialized training in physical medicine and rehabilitation for the following conditions: Daily direction of a multidisciplinary physical rehabilitation program to ensure safe treatment while eliciting the highest outcome that is of practical value to the patient.: Yes Daily medical management of patient stability for increased  activity during participation in an intensive rehabilitation regime.: Yes Daily analysis of laboratory values and/or radiology reports with any subsequent need for medication adjustment of medical intervention for : Post surgical problems;Other  Theora Master 06/20/2011, 12:27 PM

## 2011-06-21 NOTE — Progress Notes (Signed)
Patient ID: Stephanie Griffith, female   DOB: March 01, 1946, 66 y.o.   MRN: [id number]  Subjective/Complaints: Review of Systems  Gastrointestinal: Positive for constipation.  Musculoskeletal: Positive for joint pain.  All other systems reviewed and are negative.  3/21 no complaints. Sore from therapies yesterday   Objective: Vital Signs: Blood pressure 125/78, pulse 67, temperature 97.8 F (36.6 C), temperature source Oral, resp. rate 18, height 5\' 8"  (1.727 m), weight 84.6 kg (186 lb 8.2 oz), SpO2 97.00%. No results found.  Basename 06/20/11 0632  WBC 12.4*  HGB 8.7*  HCT 27.1*  PLT 814*   No results found for this basename: NA:2,K:2,CL:2,CO2:2,GLUCOSE:2,BUN:2,CREATININE:2,CALCIUM:2 in the last 72 hours CBG (last 3)  No results found for this basename: GLUCAP:3 in the last 72 hours  Wt Readings from Last 3 Encounters:  06/21/11 84.6 kg (186 lb 8.2 oz)  06/15/11 93.1 kg (205 lb 4 oz)  06/15/11 93.1 kg (205 lb 4 oz)    Physical Exam:  General appearance: alert, cooperative and no distress Head: Normocephalic, without obvious abnormality, atraumatic Eyes: conjunctivae/corneas clear. PERRL, EOM's intact. Fundi benign. Ears: normal TM's and external ear canals both ears Nose: Nares normal. Septum midline. Mucosa normal. No drainage or sinus tenderness. Throat: lips, mucosa, and tongue normal; teeth and gums normal Neck: no adenopathy, no carotid bruit, no JVD, supple, symmetrical, trachea midline and thyroid not enlarged, symmetric, no tenderness/mass/nodules Back: symmetric, no curvature. ROM normal. No CVA tenderness. Resp: clear to auscultation bilaterally Cardio: regular rate and rhythm, S1, S2 normal, no murmur, click, rub or gallop GI: soft, non-tender; bowel sounds normal; no masses,  no organomegaly-large umbilical hernia Extremities: extremities normal, atraumatic, no cyanosis or edema Pulses: 2+ and symmetric Skin: Skin color, texture, turgor normal. No rashes or  lesions Neurologic: Strength symmetrical. A little anxious and impulsive but otherwise at cognitive baseline. Sensory exam grossly intact. Strength grossly 5/5 ue and 4/5 le Incision/Wound: wounds all clean without significant drainage at amp site. Mild drainage at groin wounds with some necrotic material. 3/21 exam  Assessment/Plan: 1. 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 Physiatrist is providing close team supervision and 24 hour management of active medical problems listed below. Physiatrist and rehab team continue to assess barriers to discharge/monitor patient progress toward functional and medical goals. FIM: FIM - Bathing Bathing Steps Patient Completed: Chest;Right Arm;Left Arm;Abdomen;Front perineal area;Buttocks;Right upper leg;Left upper leg;Right lower leg (including foot) Bathing: 4: Steadying assist  FIM - Upper Body Dressing/Undressing Upper body dressing/undressing steps patient completed: Thread/unthread right sleeve of pullover shirt/dresss;Thread/unthread left sleeve of pullover shirt/dress;Put head through opening of pull over shirt/dress;Pull shirt over trunk Upper body dressing/undressing: 5: Set-up assist to: Obtain clothing/put away FIM - Lower Body Dressing/Undressing Lower body dressing/undressing steps patient completed: Thread/unthread right pants leg;Thread/unthread left pants leg;Don/Doff right sock Lower body dressing/undressing: 0: Wears gown/pajamas-no public clothing  FIM - Toileting Toileting steps completed by patient: Adjust clothing prior to toileting;Performs perineal hygiene;Adjust clothing after toileting Toileting: 5: Supervision: Safety issues/verbal cues  FIM - Air cabin crew Transfers Assistive Devices: Bedside commode (drop arm) Toilet Transfers: 4-To toilet/BSC: Min A (steadying Pt. > 75%);4-From toilet/BSC: Min A (steadying  Pt. > 75%)  FIM - Bed/Chair Transfer Bed/Chair Transfer Assistive Devices: Arm rests Bed/Chair Transfer: 4: Chair or W/C > Bed: Min A (steadying Pt. > 75%)  FIM - Locomotion: Wheelchair Distance: 30' (patient reporting fatigue with limited participation) Locomotion: Wheelchair: 4: Travels 150  ft or more: maneuvers on rugs and over door sillls with minimal assistance (Pt.>75%) FIM - Locomotion: Ambulation Locomotion: Ambulation Assistive Devices: Walker - Rolling Ambulation/Gait Assistance: 4: Min assist Locomotion: Ambulation: 1: Travels less than 50 ft with minimal assistance (Pt.>75%)  Comprehension Comprehension Mode: Auditory Comprehension: 5-Follows basic conversation/direction: With no assist  Expression Expression Mode: Verbal Expression: 5-Expresses basic needs/ideas: With no assist  Social Interaction Social Interaction: 5-Interacts appropriately 90% of the time - Needs monitoring or encouragement for participation or interaction.  Problem Solving Problem Solving: 5-Solves basic problems: With no assist  Memory Memory: 5-Recognizes or recalls 90% of the time/requires cueing < 10% of the time  1. 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 fields 2. DVT Prophylaxis/Anticoagulation: Subcutaneous Lovenox. Monitor platelet counts and any signs of bleeding  3. Pain Management: Oxycodone as needed. Monitor mental status. Stable at present  -some pain is "exercise" soreness 4. Postoperative atrial fibrillation. Patient converted to normal sinus rhythm. Followup cardiology services  5. Congestive heart failure. Weigh patient daily and monitor for any signs of fluid overload.- exam clear  6. Chronic renal insufficiency. Baseline creatinine 1.9. Strict I&O. HCTZ 25 mg daily that patient was on prior to  admission held 06/14/2011 due to increasing renal function. Weight down overall. 7. Hypertension. Norvasc 10 mg daily, Coreg 25 mg twice daily  8. Hyperlipidemia. Lipitor  9. Multiple gallstones/cholelithiasis/ventral hernia. Plan followup per Gen. surgery once patient stabilized from vascular issues to consider laparoscopic cholecystectomy and hernia repair  10. Wound care-groin incisions intact. Collagenase per surgery -continue allevyn dressing to AK incision-looks good 11. C Diff diarrhea- flagyl for 10 days. Sx improving already. Some nausea with med. Needs to take with food.  -wbc's have decreased  -try miralax today for constipation. Needs augmentation of regimen  LOS (Days) 6 A FACE TO FACE EVALUATION WAS PERFORMED  Brolin Dambrosia T 06/21/2011, 8:16 AM

## 2011-06-21 NOTE — Progress Notes (Signed)
Physical Therapy Session Note  Patient Details  Name: Stephanie Griffith MRN: [id number] Date of Birth: 07-22-1945  Today's Date: 06/21/2011 Time: 1400-1500 Time Calculation (min): 60 min  Skilled Therapeutic Interventions/Progress Updates: Pt on BSC, sit to stand for hygeine, and min A transfer to bed. Min A transfer bed to w/c. W/c propulsion to gym with supervision and encouragement with rest breaks as needed for general UE strengthening. Trunk and core strengthening with weighted medicine ball (red) for diagonals & rotation, and trunk rotation with placing cones behind and reach across body Sit to stands x 5 reps with focus on technique and safety for functional strengthening. Standing therex for L hip extension 3 sets of 10 reps. Stand pivot transfer with RW to w/c with min A, but very unsafe with moving RW and cues needed for coordination of movement.     Therapy Documentation Precautions:  Precautions Precautions: Fall Required Braces or Orthoses: No Restrictions Weight Bearing Restrictions: Yes RUE Weight Bearing: Weight bearing as tolerated LUE Weight Bearing: Weight bearing as tolerated RLE Weight Bearing: Weight bearing as tolerated LLE Weight Bearing: Non weight bearing Other Position/Activity Restrictions:  L AKA Pain: Intermittent complaint of residual limb pain. Premedicated  See FIM for current functional status  Therapy/Group: Individual Therapy  [name] [facility] 06/21/2011, 3:43 PM

## 2011-06-21 NOTE — Progress Notes (Signed)
Occupational Therapy Session Note  Patient Details  Name: Stephanie Griffith MRN: [id number] Date of Birth: 1945/10/26  Today's Date: 06/21/2011 Time: 1500-1530 Time Calculation (min): 30 min  Short Term Goals: Week 1:  OT Short Term Goal 1 (Week 1): Pt. will dress LB at supervision level OT Short Term Goal 2 (Week 1): Pt. will bathe LB at supervision level OT Short Term Goal 3 (Week 1): Pt. will perform toilet transfer at minimal assist.   OT Short Term Goal 4 (Week 1): Pt.  will perform tub transfer at minimal assist. OT Short Term Goal 5 (Week 1): Pt. will perform simple homemaking tasks at wc level with supervision level  Precautions:  Precautions Precautions: Fall Required Braces or Orthoses: No Restrictions Weight Bearing Restrictions: Yes RUE Weight Bearing: Weight bearing as tolerated LUE Weight Bearing: Weight bearing as tolerated RLE Weight Bearing: Weight bearing as tolerated LLE Weight Bearing: Non weight bearing Other Position/Activity Restrictions:  L AKA  See FIM for current functional status  Therapy/Group: Individual Therapy No complaints of pain Therapeutic activity focusing on dynamic sitting balance/tolerance/endurance, UE/UB strengthening exercises using straight weight bar (4 lbs), increasing overall activity tolerance/endurance, and stand pivot transfers. Patient with complaints of being tired during entire session.   Doneen Ollinger 06/21/2011, 3:34 PM

## 2011-06-21 NOTE — Progress Notes (Signed)
Occupational Therapy Session Note  Patient Details  Name: Stephanie Griffith MRN: [id number] Date of Birth: 05-09-1945  Today's Date: 06/21/2011 Time: 1000-1100 Time Calculation (min): 60 min  Short Term Goals: Week 1:  OT Short Term Goal 1 (Week 1): Pt. will dress LB at supervision level OT Short Term Goal 2 (Week 1): Pt. will bathe LB at supervision level OT Short Term Goal 3 (Week 1): Pt. will perform toilet transfer at minimal assist.   OT Short Term Goal 4 (Week 1): Pt.  will perform tub transfer at minimal assist. OT Short Term Goal 5 (Week 1): Pt. will perform simple homemaking tasks at wc level with supervision level  Skilled Therapeutic Interventions/Progress Updates:    Pt seated EOB and transferred to w/c with steady A for stand pivot transfer to complete bathing and dressing w/c level at sink.  Pt steady A for standing at sink to bathe buttocks.  Pt declined wearing pants, requesting to don hospital gown over shirt.  Transitioned pt to BUE therapeutic exercises with theraband to increase BUE strength and independence with BADLs.  Pt cooperative and participated in all activities.  Therapy Documentation Precautions:  Precautions Precautions: Fall Required Braces or Orthoses: No Restrictions Weight Bearing Restrictions: Yes RUE Weight Bearing: Weight bearing as tolerated LUE Weight Bearing: Weight bearing as tolerated RLE Weight Bearing: Weight bearing as tolerated LLE Weight Bearing: Non weight bearing Other Position/Activity Restrictions:  L AKA General:   Vital Signs:   Pain: Pain Assessment Pt denies pain   See FIM for current functional status  Therapy/Group: Individual Therapy  Leroy Libman 06/21/2011, 2:26 PM

## 2011-06-21 NOTE — Progress Notes (Signed)
Physical Therapy Session Note  Patient Details  Name: Stephanie Griffith MRN: [id number] Date of Birth: [birth year]  Today's Date: 06/21/2011 Time: 830-915 Time Calculation (min): 45 min  Skilled Therapeutic Interventions/Progress Updates: Min A transfer from bed to w/c. W/c propulsion  > 150' with rest breaks and encouragement. Car transfer training with pt's daughter multiple repetitions with min A and daughter return demonstrated. Daughter encouraging pt to continue to participate in therapies and get dressed for therapies and take medication for stools. Pt resistant to daughter's recommendations.     Therapy Documentation Precautions:  Precautions Precautions: Fall Required Braces or Orthoses: No Restrictions Weight Bearing Restrictions: Yes RUE Weight Bearing: Weight bearing as tolerated LUE Weight Bearing: Weight bearing as tolerated RLE Weight Bearing: Weight bearing as tolerated LLE Weight Bearing: Non weight bearing Other Position/Activity Restrictions:  L AKA    Pain: Premedicated for residual limb pain.  See FIM for current functional status  Therapy/Group: Individual Therapy  [name] [facility] 06/21/2011, 12:23 PM

## 2011-06-22 NOTE — Progress Notes (Signed)
Occupational Therapy Session Note  Patient Details  Name: Stephanie Griffith MRN: [id number] Date of Birth: 1946-01-23  Today's Date: 06/22/2011 Time: 0830-0930 Time Calculation (min): 60 min  Short Term Goals: Week 1:  OT Short Term Goal 1 (Week 1): Pt. will dress LB at supervision level OT Short Term Goal 2 (Week 1): Pt. will bathe LB at supervision level OT Short Term Goal 3 (Week 1): Pt. will perform toilet transfer at minimal assist.   OT Short Term Goal 4 (Week 1): Pt.  will perform tub transfer at minimal assist. OT Short Term Goal 5 (Week 1): Pt. will perform simple homemaking tasks at wc level with supervision level  Precautions:  Precautions Precautions: Fall Required Braces or Orthoses: No Restrictions Weight Bearing Restrictions: Yes RUE Weight Bearing: Weight bearing as tolerated LUE Weight Bearing: Weight bearing as tolerated RLE Weight Bearing: Weight bearing as tolerated LLE Weight Bearing: Non weight bearing Other Position/Activity Restrictions:  L AKA  See FIM for current functional status  Therapy/Group: Individual Therapy No complaints of pain Upon entering room patient supine in bed. Engaged in bed mobility for edge of bed -> w/c transfer, then w/c -> drop arm BSC transfer. Patient completed transfers with minimal assistance. Sit-> stand position for ADL retraining at sink level. Focused skilled intervention on increasing overall activity tolerance/endurance, UB/LB bathing & dressing, sit/stands, and grooming tasks seated at sink in w/c. After ADL encouraged patient to go to gym for UE/UB strengthening exercises using SCIF[employer]. Patient completed exercises seated in w/c.   Elianie Hubers 06/22/2011, 10:08 AM

## 2011-06-22 NOTE — Progress Notes (Signed)
Wounds still with fibrinous exudate groin and thigh but overall improved Will recheck next week  [name], MD [facility] Office: [phone] Pager: [phone]

## 2011-06-22 NOTE — Progress Notes (Signed)
Occupational Therapy Session Note  Patient Details  Name: Stephanie Griffith MRN: [id number] Date of Birth: 12-23-1945  Today's Date: 06/22/2011 Time: [id number] Time Calculation (min): 30 min  Short Term Goals: Week 1:  OT Short Term Goal 1 (Week 1): Pt. will dress LB at supervision level OT Short Term Goal 2 (Week 1): Pt. will bathe LB at supervision level OT Short Term Goal 3 (Week 1): Pt. will perform toilet transfer at minimal assist.   OT Short Term Goal 4 (Week 1): Pt.  will perform tub transfer at minimal assist. OT Short Term Goal 5 (Week 1): Pt. will perform simple homemaking tasks at wc level with supervision level  Skilled Therapeutic Interventions/Progress Updates:    1:1 focus on therapeutic exercise (pt 's choice). Attempted to have pt go down to ADL apartment to work on simple homemaking tasks at w/c level but pt declined. Stand pivot transfers bed to w/c, UB exercise with 4 lb and 5 lb weights for strengthening in prep for transfer to ensure clearing w/c wheel with bottom and activity tolerance..   Therapy Documentation Precautions:  Precautions Precautions: Fall Required Braces or Orthoses: No Restrictions Weight Bearing Restrictions: Yes RUE Weight Bearing: Weight bearing as tolerated LUE Weight Bearing: Weight bearing as tolerated RLE Weight Bearing: Weight bearing as tolerated LLE Weight Bearing: Non weight bearing Other Position/Activity Restrictions:  L AKA Pain: Pain Assessment Pain Score: 0-No pain  See FIM for current functional status  Therapy/Group: Individual Therapy  Merrilee Seashore 06/22/2011, 3:23 PM

## 2011-06-22 NOTE — Progress Notes (Addendum)
Subjective/Complaints: Review of Systems  Gastrointestinal: Negative for constipation.  Musculoskeletal: Positive for joint pain.  All other systems reviewed and are negative.  3/22- no new complaints--had a bm yesterday   Objective: Vital Signs: Blood pressure 146/71, pulse 74, temperature 98.1 F (36.7 C), temperature source Oral, resp. rate 20, height 5\' 8"  (1.727 m), weight 84.8 kg (186 lb 15.2 oz), SpO2 98.00%. No results found.  Basename 06/20/11 0632  WBC 12.4*  HGB 8.7*  HCT 27.1*  PLT 814*   No results found for this basename: NA:2,K:2,CL:2,CO2:2,GLUCOSE:2,BUN:2,CREATININE:2,CALCIUM:2 in the last 72 hours CBG (last 3)  No results found for this basename: GLUCAP:3 in the last 72 hours  Wt Readings from Last 3 Encounters:  06/22/11 84.8 kg (186 lb 15.2 oz)  06/15/11 93.1 kg (205 lb 4 oz)  06/15/11 93.1 kg (205 lb 4 oz)    Physical Exam:  General appearance: alert, cooperative and no distress Head: Normocephalic, without obvious abnormality, atraumatic Eyes: conjunctivae/corneas clear. PERRL, EOM's intact. Fundi benign. Ears: normal TM's and external ear canals both ears Nose: Nares normal. Septum midline. Mucosa normal. No drainage or sinus tenderness. Throat: lips, mucosa, and tongue normal; teeth and gums normal Neck: no adenopathy, no carotid bruit, no JVD, supple, symmetrical, trachea midline and thyroid not enlarged, symmetric, no tenderness/mass/nodules Back: symmetric, no curvature. ROM normal. No CVA tenderness. Resp: clear to auscultation bilaterally Cardio: regular rate and rhythm, S1, S2 normal, no murmur, click, rub or gallop GI: soft, non-tender; bowel sounds normal; no masses,  no organomegaly-large umbilical hernia Extremities: extremities normal, atraumatic, no cyanosis or edema Pulses: 2+ and symmetric Skin: Skin color, texture, turgor normal. No rashes or lesions Neurologic: Strength symmetrical. A little anxious and impulsive but otherwise at  cognitive baseline. Sensory exam grossly intact. Strength grossly 5/5 ue and 4/5 le Incision/Wound: wounds all clean without significant drainage at amp site. Mild drainage at groin wounds with some necrotic material. 3/21 exam  Assessment/Plan: 1. 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 Physiatrist is providing close team supervision and 24 hour management of active medical problems listed below. Physiatrist and rehab team continue to assess barriers to discharge/monitor patient progress toward functional and medical goals. FIM: FIM - Bathing Bathing Steps Patient Completed: Chest;Right Arm;Left Arm;Abdomen;Front perineal area;Buttocks;Right upper leg;Left upper leg;Left lower leg (including foot) Bathing: 4: Steadying assist  FIM - Upper Body Dressing/Undressing Upper body dressing/undressing steps patient completed: Thread/unthread right sleeve of pullover shirt/dresss;Thread/unthread left sleeve of pullover shirt/dress;Put head through opening of pull over shirt/dress;Pull shirt over trunk Upper body dressing/undressing: 5: Set-up assist to: Obtain clothing/put away FIM - Lower Body Dressing/Undressing Lower body dressing/undressing steps patient completed: Thread/unthread right pants leg;Thread/unthread left pants leg;Don/Doff right sock Lower body dressing/undressing: 0: Wears gown/pajamas-no public clothing  FIM - Toileting Toileting steps completed by patient: Adjust clothing prior to toileting;Performs perineal hygiene;Adjust clothing after toileting Toileting: 5: Supervision: Safety issues/verbal cues  FIM - Air cabin crew Transfers Assistive Devices: Bedside commode (drop arm) Toilet Transfers: 4-To toilet/BSC: Min A (steadying Pt. > 75%);4-From toilet/BSC: Min A (steadying Pt. > 75%)  FIM - Bed/Chair Transfer Bed/Chair Transfer Assistive Devices: Arm rests;Bed  rails Bed/Chair Transfer: 4: Bed > Chair or W/C: Min A (steadying Pt. > 75%)  FIM - Locomotion: Wheelchair Distance: 30' (patient reporting fatigue with limited participation) Locomotion: Wheelchair: 5: Travels 150 ft or more: maneuvers on rugs and over door sills with supervision, cueing or coaxing  FIM - Locomotion: Ambulation Locomotion: Ambulation Assistive Devices: Administrator Ambulation/Gait Assistance: 4: Min assist Locomotion: Ambulation: 1: Travels less than 50 ft with minimal assistance (Pt.>75%)  Comprehension Comprehension Mode: Auditory Comprehension: 5-Follows basic conversation/direction: With no assist  Expression Expression Mode: Verbal Expression: 5-Expresses basic needs/ideas: With no assist  Social Interaction Social Interaction: 5-Interacts appropriately 90% of the time - Needs monitoring or encouragement for participation or interaction.  Problem Solving Problem Solving: 5-Solves basic problems: With no assist  Memory Memory: 5-Recognizes or recalls 90% of the time/requires cueing < 10% of the time  1. 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 fields 2. DVT Prophylaxis/Anticoagulation: Subcutaneous Lovenox. Monitor platelet counts and any signs of bleeding  3. Pain Management: Oxycodone as needed. Monitor mental status. Stable at present  -some pain is "exercise" soreness and i encouraged the patient to work through it 4. Postoperative atrial fibrillation. Patient converted to normal sinus rhythm. Followup cardiology services  5. Congestive heart failure. Weigh patient daily and monitor for any signs of fluid overload.- exam clear  6. Chronic renal insufficiency. Baseline creatinine 1.9. Strict I&O. HCTZ 25 mg daily that patient was on prior to admission held 06/14/2011 due to  increasing renal function. Weight down overall. 7. Hypertension. Norvasc 10 mg daily, Coreg 25 mg twice daily  8. Hyperlipidemia. Lipitor  9. Multiple gallstones/cholelithiasis/ventral hernia. Plan followup per Gen. surgery once patient stabilized from vascular issues to consider laparoscopic cholecystectomy and hernia repair  10. Wound care-groin incisions intact. Collagenase per surgery -continue allevyn dressing to AK incision-looks good 11. C Diff diarrhea- flagyl for 10 days. Sx improving already. Some nausea with med. Needs to take with food.  -wbc's have decreased  -BM finally yesterday  -recheck labs monday  LOS (Days) 7 A FACE TO FACE EVALUATION WAS PERFORMED  Tichina Koebel T 06/22/2011, 7:05 AM

## 2011-06-22 NOTE — Progress Notes (Signed)
Physical Therapy Weekly Progress Note  Patient Details  Name: Stephanie Griffith MRN: [id number] Date of Birth: Mar 08, 1946  Today's Date: 06/22/2011  Session#1: Time: [id number] Time Calculation (min): 67 min Min A transfer from w/c to mat, with pt managing leg rests and w/c set up with min A. Side lying L hip extension and abduction x 15 reps x 2 sets. RLE LAQs and seated marches x 15 reps x 2 sets with 3# ankle weight. Sit to stands for functional strengthening x 5 reps with focus on technique, cues needed. Gait with RW for functional strengthening x 8' with min A. Pt refused further gait due to fatigue. W/c propulsion for general UE strengthening and coordination with supervision x 150'. Steady A squat pivot transfer back to bed to rest, pt set up w/c with supervision.   Session #2: [id number] Time Calculation (min): 55 minutes Pt refused to go to the gym or get OOB. When asked what she would like to work on, stated she would only work on strengthening her arms. Reviewed UE theraband exercises for adduction, flexion, horizontal abduction and IR/ER, repeated 10 reps bilaterally. With 3# dumbbells. Bicep curls, shoulder press, ER, triceps press, shoulder flexion, trunk rotation and diagonals x 15 reps each bilaterally.   Patient has been making progress during stay on CIR. Pt is demonstrating improvement with participation in therapies though often still requires encouragement. Pt is overall min A at this time with goals for supervision overall. Pt's daughter has observed a few therapy sessions with return demonstration for car transfers.  Patient continues to demonstrate the following deficits: strength, activity tolerance, functional mobility and therefore will continue to benefit from skilled PT intervention to enhance overall performance with activity tolerance.  Patient progressing toward long term goals..  Continue plan of care.  PT Short Term Goals = LTGs  Skilled Therapeutic  Interventions/Progress Updates:  Ambulation/gait training;Balance/vestibular training;Community reintegration;Discharge planning;DME/adaptive equipment instruction;Functional mobility training;Neuromuscular re-education;Pain management;Patient/family education;Psychosocial support;Therapeutic Activities;Splinting/orthotics;Skin care/wound management;Therapeutic Exercise;Stair training;UE/LE Strength taining/ROM;UE/LE Coordination activities;Wheelchair propulsion/positioning   Therapy Documentation Precautions:  Precautions Precautions: Fall Required Braces or Orthoses: No Restrictions Weight Bearing Restrictions: Yes RUE Weight Bearing: Weight bearing as tolerated LUE Weight Bearing: Weight bearing as tolerated RLE Weight Bearing: Weight bearing as tolerated LLE Weight Bearing: Non weight bearing Other Position/Activity Restrictions:  L AKA Pain: c/o residual limb pain end of session, notified RN.  See FIM for current functional status  Therapy/Group: Individual Therapy  [name] [facility] 06/22/2011, 10:24 AM

## 2011-06-23 NOTE — Progress Notes (Signed)
Patient ID: Stephanie Griffith, female   DOB: 12-11-1945, 66 y.o.   MRN: [id number]  Subjective/Complaints: Review of Systems  Gastrointestinal: Negative for constipation.  Musculoskeletal: Positive for joint pain.  All other systems reviewed and are negative.  3/23- no new complaints-- Chest remains clear; CV- regular without tachycardia; Abd- soft and non distended; Extr- s/p L AKA  BP Readings from Last 3 Encounters:  06/23/11 132/78  06/15/11 144/74  06/15/11 144/74     Objective: Vital Signs: Blood pressure 132/78, pulse 77, temperature 97.8 F (36.6 C), temperature source Oral, resp. rate 18, height 5\' 8"  (1.727 m), weight 84.4 kg (186 lb 1.1 oz), SpO2 100.00%. No results found. No results found for this basename: WBC:2,HGB:2,HCT:2,PLT:2 in the last 72 hours No results found for this basename: NA:2,K:2,CL:2,CO2:2,GLUCOSE:2,BUN:2,CREATININE:2,CALCIUM:2 in the last 72 hours CBG (last 3)  No results found for this basename: GLUCAP:3 in the last 72 hours  Wt Readings from Last 3 Encounters:  06/23/11 84.4 kg (186 lb 1.1 oz)  06/15/11 93.1 kg (205 lb 4 oz)  06/15/11 93.1 kg (205 lb 4 oz)    Physical Exam:  General appearance: alert, cooperative and no distress Head: Normocephalic, without obvious abnormality, atraumatic Eyes: conjunctivae/corneas clear. PERRL, EOM's intact. Fundi benign. Ears: normal TM's and external ear canals both ears Nose: Nares normal. Septum midline. Mucosa normal. No drainage or sinus tenderness. Throat: lips, mucosa, and tongue normal; teeth and gums normal Neck: no adenopathy, no carotid bruit, no JVD, supple, symmetrical, trachea midline and thyroid not enlarged, symmetric, no tenderness/mass/nodules Back: symmetric, no curvature. ROM normal. No CVA tenderness. Resp: clear to auscultation bilaterally Cardio: regular rate and rhythm, S1, S2 normal, no murmur, click, rub or gallop GI: soft, non-tender; bowel sounds normal; no masses,  no  organomegaly-large umbilical hernia Extremities: extremities normal, atraumatic, no cyanosis or edema Pulses: 2+ and symmetric Skin: Skin color, texture, turgor normal. No rashes or lesions Neurologic: Strength symmetrical. A little anxious and impulsive but otherwise at cognitive baseline. Sensory exam grossly intact. Strength grossly 5/5 ue and 4/5 le Incision/Wound: wounds all clean without significant drainage at amp site. Mild drainage at groin wounds with some necrotic material. 3/21 exam  Assessment/Plan: 1. 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 Physiatrist is providing close team supervision and 24 hour management of active medical problems listed below. Physiatrist and rehab team continue to assess barriers to discharge/monitor patient progress toward functional and medical goals. FIM: FIM - Bathing Bathing Steps Patient Completed: Chest;Right Arm;Left Arm;Abdomen;Front perineal area;Buttocks;Right upper leg;Left upper leg;Right lower leg (including foot) Bathing: 4: Steadying assist  FIM - Upper Body Dressing/Undressing Upper body dressing/undressing steps patient completed: Thread/unthread right sleeve of pullover shirt/dresss;Thread/unthread left sleeve of pullover shirt/dress;Put head through opening of pull over shirt/dress;Pull shirt over trunk Upper body dressing/undressing: 5: Set-up assist to: Obtain clothing/put away FIM - Lower Body Dressing/Undressing Lower body dressing/undressing steps patient completed: Thread/unthread right pants leg;Thread/unthread left pants leg;Don/Doff right sock Lower body dressing/undressing: 3: Mod-Patient completed 50-74% of tasks  FIM - Toileting Toileting steps completed by patient: Adjust clothing prior to toileting;Performs perineal hygiene;Adjust clothing after toileting Toileting: 5: Supervision: Safety issues/verbal cues  FIM  - Radio producer Devices: Grab bars;Bedside commode Toilet Transfers: 4-To toilet/BSC: Min A (steadying Pt. > 75%);4-From toilet/BSC: Min A (steadying Pt. > 75%)  FIM - Control and instrumentation engineer Devices: Walker;Arm rests Bed/Chair Transfer: 6: Supine > Sit: No  assist;6: Sit > Supine: No assist;4: Bed > Chair or W/C: Min A (steadying Pt. > 75%);4: Chair or W/C > Bed: Min A (steadying Pt. > 75%)  FIM - Locomotion: Wheelchair Distance: 30' (patient reporting fatigue with limited participation) Locomotion: Wheelchair: 0: Activity did not occur FIM - Locomotion: Ambulation Locomotion: Ambulation Assistive Devices: Administrator Ambulation/Gait Assistance: 4: Min assist Locomotion: Ambulation: 1: Travels less than 50 ft with minimal assistance (Pt.>75%)  Comprehension Comprehension Mode: Auditory Comprehension: 5-Follows basic conversation/direction: With no assist  Expression Expression Mode: Verbal Expression: 5-Expresses basic needs/ideas: With no assist  Social Interaction Social Interaction: 5-Interacts appropriately 90% of the time - Needs monitoring or encouragement for participation or interaction.  Problem Solving Problem Solving: 5-Solves basic problems: With no assist  Memory Memory: 5-Recognizes or recalls 90% of the time/requires cueing < 10% of the time  1. 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 fields 2. DVT Prophylaxis/Anticoagulation: Subcutaneous Lovenox. Monitor platelet counts and any signs of bleeding  3. Pain Management: Oxycodone as needed. Monitor mental status. Stable at present  -some pain is "exercise" soreness and i encouraged the patient to work through it 4. Postoperative atrial fibrillation. Patient converted to normal sinus  rhythm. Followup cardiology services  5. Congestive heart failure. Weigh patient daily and monitor for any signs of fluid overload.- exam clear  6. Chronic renal insufficiency. Baseline creatinine 1.9. Strict I&O. HCTZ 25 mg daily that patient was on prior to admission held 06/14/2011 due to increasing renal function. Weight down overall. 7. Hypertension. Norvasc 10 mg daily, Coreg 25 mg twice daily  8. Hyperlipidemia. Lipitor  9. Multiple gallstones/cholelithiasis/ventral hernia. Plan followup per Gen. surgery once patient stabilized from vascular issues to consider laparoscopic cholecystectomy and hernia repair  10. Wound care-groin incisions intact. Collagenase per surgery -continue allevyn dressing to AK incision-looks good 11. C Diff diarrhea- flagyl for 10 days. Sx improving already. Some nausea with med. Needs to take with food.  -wbc's have decreased  -BM finally yesterday  -recheck labs monday  LOS (Days) 8 A FACE TO FACE EVALUATION WAS PERFORMED  Nyoka Cowden 06/23/2011, 9:31 AM

## 2011-06-23 NOTE — Progress Notes (Signed)
[facility] [place]   Amputation  Date of Surgery: 06/12/11  Left AKA Surgeon: CE Fileds, Md Subjective Stephanie Griffith is a 66 y.o. female who is S/P Left AKA.  Pt.denies increased pain in the stump. The patient notes pain is well controlled. Pt. reports phantom pain.  Significant Diagnostic Studies: CBC Lab Results  Component Value Date   WBC 12.4* 06/20/2011   HGB 8.7* 06/20/2011   HCT 27.1* 06/20/2011   MCV 88.3 06/20/2011   PLT 814* 06/20/2011    BMET    Component Value Date/Time   NA 132* 06/18/2011 0645   K 4.1 06/18/2011 0645   CL 96 06/18/2011 0645   CO2 25 06/18/2011 0645   GLUCOSE 134* 06/18/2011 0645   BUN 32* 06/18/2011 0645   CREATININE 1.63* 06/18/2011 0645   CALCIUM 8.7 06/18/2011 0645   GFRNONAA 32* 06/18/2011 0645   GFRAA 37* 06/18/2011 0645    COAG Lab Results  Component Value Date   INR 1.13 06/04/2011   No results found for this basename: PTT     Intake/Output Summary (Last 24 hours) at 06/23/11 0927 Last data filed at 06/22/11 1400  Gross per 24 hour  Intake    240 ml  Output      0 ml  Net    240 ml   Patient Vitals for the past 24 hrs:  Urine Occurrence  06/23/11 0430 1      Physical Examination  BP Readings from Last 3 Encounters:  06/23/11 132/78  06/15/11 144/74  06/15/11 144/74   Temp Readings from Last 3 Encounters:  06/23/11 97.8 F (36.6 C) Oral  06/15/11 97.2 F (36.2 C) Oral  06/15/11 97.2 F (36.2 C) Oral   SpO2 Readings from Last 3 Encounters:  06/23/11 100%  06/15/11 96%  06/15/11 96%   Pulse Readings from Last 3 Encounters:  06/23/11 77  06/15/11 69  06/15/11 69    Pt is A&Ox3  WDWN female with no complaints  Left amputation wound is healing well.  There is good bone coverage in the stump Stump is warm and well perfused, without drainage; without erythema   Assessment/plan:  Stephanie Griffith is a 66 y.o. female who is s/p Left AKA  The patient's stump is viable.  Follow-up 4 weeks from  surgery  May leave dressing off and start stump shrinker  Stephanie Griffith J 9:27 AM 06/23/2011 [id number]

## 2011-06-23 NOTE — Progress Notes (Signed)
Physical Therapy Session Note  Patient Details  Name: Stephanie Griffith MRN: [id number] Date of Birth: 11/11/1945  Today's Date: 06/23/2011 Time 8:15-9:04 (16min)   Skilled Therapeutic Interventions/Progress Updates:  Tx focused on functional transfer training, there-ex, gait training, and dynamic sitting/standing balance during functional tasks. Supine<>sit Mod I, squat-pivot transfer with Min A and grandson assist with WC set-up, pt able to direct cares with him. Pt began to sit before fully cleared armrest, needing cues to complete turn before sitting. Washing and dressing at [facility] level with set-up for obtaining clothes and for pulling up pants in standing. Pt requests grandson's help with dressing despite cues for increasing independence. Repeated sit<>stand at sink for washing with cues for safe hand placement 2x30sec standing with contact-guard assist. Standing there-ex 2x15 L hip ext with cues for technique. Gait training x10' with min-guard assist and RW, cues for increasing elbow ext for longer hop step, and increasing RW distance each step a few inches. Pt required Min A to complete turn safely to bed, cues for hand placement to control descent.  Sidelying there-ex for L hip ABD with manual facil for full sidelying in order to recruit hip abductors rather than flexors, 2x15.  Reinforced pt education on importance of continuing short distance gait for LE strengthening and bil LE there ex in preparation for prosthesis.      Therapy Documentation Precautions:  Precautions Precautions: Fall Required Braces or Orthoses: No Restrictions Weight Bearing Restrictions: Yes RUE Weight Bearing: Weight bearing as tolerated LUE Weight Bearing: Weight bearing as tolerated RLE Weight Bearing: Weight bearing as tolerated LLE Weight Bearing: Non weight bearing Other Position/Activity Restrictions:  L AKA     Pain: Pain Assessment Pain Assessment: No/denies pain        See FIM for current  functional status  Therapy/Group: Individual Therapy  Soundra Pilon, PT 06/23/2011, 8:32 AM

## 2011-06-24 NOTE — Progress Notes (Signed)
Occupational Therapy Session Note  Patient Details  Name: Stephanie Griffith MRN: [id number] Date of Birth: [birth year]  Today's Date: 06/24/2011 Time: 1030-1130 Time Calculation (min): 60 min  Short Term Goals: Week 2:     Skilled Therapeutic Interventions/Progress Updates:    Pt. Engaged in therapeuticbathing and dressing at sink level.  Grandson assisted with transfers and bathing/dressing at sink.  Educated grandson on allowing pt to perform task before intervening.  Gave him moderate cues before he actually followed through.   Grandson interested in being a PT.  Instructed him to be a good therapist he would need to learn more hands off to allow pt to do.  Pt. Willing to try to do for herself as much as possible with a few remarks about how glad she was to be going home.    Therapy Documentation Precautions:  Precautions Precautions: Fall Required Braces or Orthoses: No Restrictions Weight Bearing Restrictions: Yes RUE Weight Bearing: Weight bearing as tolerated LUE Weight Bearing: Weight bearing as tolerated RLE Weight Bearing: Weight bearing as tolerated LLE Weight Bearing: Non weight bearing Other Position/Activity Restrictions:  L AKA   Pain: Pain Assessment Pain Score: 0-No pain ADL: Upper Body Bathing: Setup Where Assessed-Upper Body Bathing:sitting at sink Lower Body Bathing:  minimal  assistance Where Assessed-Lower Body Bathing:sitting at sink Upper Body Dressing: Setup Where Assessed-Upper Body Dressing:sitting at sink Lower Body Dressing: minimal assistance Where Assessed-Lower Body Dressing:  Sit/stand at sink  See FIM for current functional status  Therapy/Group: Individual Therapy  Lisa Roca 06/24/2011, 3:47 PM

## 2011-06-24 NOTE — Progress Notes (Signed)
Patient ID: Stephanie Griffith, female   DOB: [birth year], 66 y.o.   MRN: [id number] Patient ID: Stephanie Griffith, female   DOB: 28-May-1945, 66 y.o.   MRN: [id number]  Subjective/Complaints: Review of Systems  Gastrointestinal: Negative for constipation.  Musculoskeletal: Positive for joint pain.  All other systems reviewed and are negative.  3/24.  No new complaints but does c/o some post op pain  at AKA site.  Oxycodone ordered for pain control.  Chest remains clear; CV- regular without tachycardia; Abd- soft and non distended; Extr- s/p L AKA  BP Readings from Last 3 Encounters:  06/24/11 121/80  06/15/11 144/74  06/15/11 144/74     Objective: Vital Signs: Blood pressure 121/80, pulse 75, temperature 98.4 F (36.9 C), temperature source Oral, resp. rate 18, height 5\' 8"  (1.727 m), weight 84.2 kg (185 lb 10 oz), SpO2 99.00%. No results found. No results found for this basename: WBC:2,HGB:2,HCT:2,PLT:2 in the last 72 hours No results found for this basename: NA:2,K:2,CL:2,CO2:2,GLUCOSE:2,BUN:2,CREATININE:2,CALCIUM:2 in the last 72 hours CBG (last 3)  No results found for this basename: GLUCAP:3 in the last 72 hours  Wt Readings from Last 3 Encounters:  06/24/11 84.2 kg (185 lb 10 oz)  06/15/11 93.1 kg (205 lb 4 oz)  06/15/11 93.1 kg (205 lb 4 oz)    Physical Exam:  General appearance: alert, cooperative and no distress Head: Normocephalic, without obvious abnormality, atraumatic Eyes: conjunctivae/corneas clear. PERRL, EOM's intact. Fundi benign. Ears: normal TM's and external ear canals both ears Nose: Nares normal. Septum midline. Mucosa normal. No drainage or sinus tenderness. Throat: lips, mucosa, and tongue normal; teeth and gums normal Neck: no adenopathy, no carotid bruit, no JVD, supple, symmetrical, trachea midline and thyroid not enlarged, symmetric, no tenderness/mass/nodules Back: symmetric, no curvature. ROM normal. No CVA tenderness. Resp: clear to auscultation  bilaterally Cardio: regular rate and rhythm, S1, S2 normal, no murmur, click, rub or gallop GI: soft, non-tender; bowel sounds normal; no masses,  no organomegaly-large umbilical hernia Extremities: extremities normal, atraumatic, no cyanosis or edema Pulses: 2+ and symmetric Skin: Skin color, texture, turgor normal. No rashes or lesions Neurologic: Strength symmetrical. A little anxious and impulsive but otherwise at cognitive baseline. Sensory exam grossly intact. Strength grossly 5/5 ue and 4/5 le Incision/Wound: wounds all clean without significant drainage at amp site. Mild drainage at groin wounds with some necrotic material. 3/21 exam  Assessment/Plan: 1. 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 Physiatrist is providing close team supervision and 24 hour management of active medical problems listed below. Physiatrist and rehab team continue to assess barriers to discharge/monitor patient progress toward functional and medical goals. FIM: FIM - Bathing Bathing Steps Patient Completed: Chest;Right Arm;Left Arm;Abdomen;Front perineal area;Buttocks;Right upper leg;Left upper leg;Right lower leg (including foot) Bathing: 4: Steadying assist  FIM - Upper Body Dressing/Undressing Upper body dressing/undressing steps patient completed: Thread/unthread right sleeve of pullover shirt/dresss;Thread/unthread left sleeve of pullover shirt/dress;Put head through opening of pull over shirt/dress;Pull shirt over trunk Upper body dressing/undressing: 5: Set-up assist to: Obtain clothing/put away FIM - Lower Body Dressing/Undressing Lower body dressing/undressing steps patient completed: Thread/unthread right pants leg;Thread/unthread left pants leg;Don/Doff right sock Lower body dressing/undressing: 3: Mod-Patient completed 50-74% of tasks  FIM - Toileting Toileting steps completed by patient:  Adjust clothing prior to toileting;Performs perineal hygiene;Adjust clothing after toileting Toileting: 5: Supervision: Safety issues/verbal cues  FIM - Radio producer Devices: Grab bars;Bedside commode Toilet  Transfers: 4-To toilet/BSC: Min A (steadying Pt. > 75%);4-From toilet/BSC: Min A (steadying Pt. > 75%)  FIM - Control and instrumentation engineer Devices: Walker;Arm rests Bed/Chair Transfer: 6: Supine > Sit: No assist;6: Sit > Supine: No assist;4: Bed > Chair or W/C: Min A (steadying Pt. > 75%);4: Chair or W/C > Bed: Min A (steadying Pt. > 75%)  FIM - Locomotion: Wheelchair Distance: 30' (patient reporting fatigue with limited participation) Locomotion: Wheelchair: 0: Activity did not occur FIM - Locomotion: Ambulation Locomotion: Ambulation Assistive Devices: Administrator Ambulation/Gait Assistance: 4: Min assist Locomotion: Ambulation: 1: Travels less than 50 ft with minimal assistance (Pt.>75%)  Comprehension Comprehension Mode: Auditory Comprehension: 5-Follows basic conversation/direction: With no assist  Expression Expression Mode: Verbal Expression: 5-Expresses basic needs/ideas: With no assist  Social Interaction Social Interaction: 5-Interacts appropriately 90% of the time - Needs monitoring or encouragement for participation or interaction.  Problem Solving Problem Solving: 5-Solves basic problems: With no assist  Memory Memory: 5-Recognizes or recalls 90% of the time/requires cueing < 10% of the time  1. 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 fields 2. DVT Prophylaxis/Anticoagulation: Subcutaneous Lovenox. Monitor platelet counts and any signs of bleeding  3. Pain Management: Oxycodone as needed. Monitor mental status. Stable at  present  -some pain is "exercise" soreness and i encouraged the patient to work through it 4. Postoperative atrial fibrillation. Patient converted to normal sinus rhythm. Followup cardiology services  5. Congestive heart failure. Weigh patient daily and monitor for any signs of fluid overload.- exam clear  6. Chronic renal insufficiency. Baseline creatinine 1.9. Strict I&O. HCTZ 25 mg daily that patient was on prior to admission held 06/14/2011 due to increasing renal function. Weight down overall. 7. Hypertension. Norvasc 10 mg daily, Coreg 25 mg twice daily  8. Hyperlipidemia. Lipitor  9. Multiple gallstones/cholelithiasis/ventral hernia. Plan followup per Gen. surgery once patient stabilized from vascular issues to consider laparoscopic cholecystectomy and hernia repair  10. Wound care-groin incisions intact. Collagenase per surgery -continue allevyn dressing to AK incision-looks good 11. C Diff diarrhea- flagyl for 10 days. Sx improving already. Some nausea with med. Needs to take with food.  -wbc's have decreased  -BM finally yesterday  -recheck labs monday  LOS (Days) 9 A FACE TO FACE EVALUATION WAS PERFORMED  Nyoka Cowden 06/24/2011, 9:17 AM

## 2011-06-24 NOTE — Plan of Care (Signed)
Problem: RH SKIN INTEGRITY Goal: RH STG ABLE TO PERFORM INCISION/WOUND CARE W/ASSISTANCE STG Able To Perform Incision/Wound Care With max Assistance.  Outcome: Progressing Changed goal to max assist since dressings done for patient and family member able to observe.

## 2011-06-25 DIAGNOSIS — I70269 Atherosclerosis of native arteries of extremities with gangrene, unspecified extremity: Secondary | ICD-10-CM

## 2011-06-25 DIAGNOSIS — N289 Disorder of kidney and ureter, unspecified: Secondary | ICD-10-CM

## 2011-06-25 DIAGNOSIS — S78119A Complete traumatic amputation at level between unspecified hip and knee, initial encounter: Secondary | ICD-10-CM

## 2011-06-25 DIAGNOSIS — A0472 Enterocolitis due to Clostridium difficile, not specified as recurrent: Secondary | ICD-10-CM

## 2011-06-25 DIAGNOSIS — Z5189 Encounter for other specified aftercare: Secondary | ICD-10-CM

## 2011-06-25 LAB — CBC
HCT: 30.6 % — ABNORMAL LOW (ref 36.0–46.0)
Hemoglobin: 9.8 g/dL — ABNORMAL LOW (ref 12.0–15.0)
MCH: 28.6 pg (ref 26.0–34.0)
MCHC: 32 g/dL (ref 30.0–36.0)
MCV: 89.2 fL (ref 78.0–100.0)
Platelets: 630 10*3/uL — ABNORMAL HIGH (ref 150–400)
RBC: 3.43 MIL/uL — ABNORMAL LOW (ref 3.87–5.11)
RDW: 17.2 % — ABNORMAL HIGH (ref 11.5–15.5)
WBC: 14.1 10*3/uL — ABNORMAL HIGH (ref 4.0–10.5)

## 2011-06-25 LAB — BASIC METABOLIC PANEL
BUN: 22 mg/dL (ref 6–23)
CO2: 24 mEq/L (ref 19–32)
Calcium: 9.1 mg/dL (ref 8.4–10.5)
Chloride: 96 mEq/L (ref 96–112)
Creatinine, Ser: 1.66 mg/dL — ABNORMAL HIGH (ref 0.50–1.10)
GFR calc Af Amer: 36 mL/min — ABNORMAL LOW (ref >90)
GFR calc non Af Amer: 31 mL/min — ABNORMAL LOW (ref >90)
Glucose, Bld: 138 mg/dL — ABNORMAL HIGH (ref 70–99)
Potassium: 4.5 mEq/L (ref 3.5–5.1)
Sodium: 131 mEq/L — ABNORMAL LOW (ref 135–145)

## 2011-06-25 NOTE — Progress Notes (Signed)
Physical Therapy Discharge Summary  Patient Details  Name: Stephanie Griffith MRN: [id number] Date of Birth: 1945/04/27  Today's Date: 06/25/2011 Time: 0930-1030 Time Calculation (min): 60 min Family education with pt's daughter for basic transfers, w/c safety, reviewed HEP, and return demonstration of gait very short distances. Reviewed cueing needed for pt with transfers for safety and checking set- up of w/c parts. Discussed importance of maintaining HEP and positioning for L residual limb. Daughter demonstrated and verbalized understanding of all aspects covered. W/c propulsion to/from gym with supervision for general UE strengthening.  Time: [id number] Time Calculation (30 minutes) Pt refused to go out of room or to the gym. Agreeable to do seated therex for 3# ankle weights x 15 reps x 2 sets for marches and LAQs. Close supervision transfer back to bed to rest.  Patient has met 8 of 8 long term goals due to improved activity tolerance and ability to compensate for deficits.  Patient to discharge at a wheelchair level Supervision.   Patient's daughter is independent to provide the necessary supervision assistance at discharge. Pt is able to gait very short distances 10-15' with min A, daughter return demonstrated technique and education complete for all mobility.  Reasons goals not met: n/a  Recommendation:  Patient will benefit from ongoing skilled PT services in home health setting to continue to advance safe functional mobility, address ongoing impairments in strength, ROM, balance, strength, and functional mobility, and minimize fall risk.  Equipment: 18 x 18 w/c, cushion, RW  Reasons for discharge: treatment goals met and discharge from hospital  Patient/family agrees with progress made and goals achieved: Yes  PT Discharge Precautions/Restrictions Precautions Precautions: Fall Required Braces or Orthoses: No Restrictions Weight Bearing Restrictions: Yes LLE Weight Bearing: Non  weight bearing Other Position/Activity Restrictions: left AKA Pain 8/10 residual limb pain. RN notified. Vision/Perception  Vision - History Baseline Vision: Wears glasses all the time Patient Visual Report: No change from baseline Vision - Assessment Eye Alignment: Within Functional Limits Perception Perception: Within Functional Limits  Cognition Overall Cognitive Status: Appears within functional limits for tasks assessed Arousal/Alertness: Awake/alert Orientation Level: Oriented X4 Safety/Judgment: Impaired (cueing for safety with transfers) Sensation Sensation Light Touch: Appears Intact Proprioception: Appears Intact Coordination Gross Motor Movements are Fluid and Coordinated: Yes Fine Motor Movements are Fluid and Coordinated: Yes Motor  Motor Motor: Abnormal postural alignment and control;Other (comment) (flexed posture in standing, L AKA, generalized weakness)     Trunk/Postural Assessment  Cervical Assessment Cervical Assessment: Within Functional Limits Thoracic Assessment Thoracic Assessment: Within Functional Limits Lumbar Assessment Lumbar Assessment: Within Functional Limits Postural Control Postural Control: Within Functional Limits  Balance Balance Balance Assessed: Yes Static Sitting Balance Static Sitting - Level of Assistance: 5: Stand by assistance Dynamic Sitting Balance Dynamic Sitting - Level of Assistance: 5: Stand by assistance Static Standing Balance Static Standing - Level of Assistance: 4: Min assist (with UE support) Dynamic Standing Balance Dynamic Standing - Level of Assistance: 4: Min assist (with UE support) Extremity Assessment  RUE Assessment RUE Assessment: Within Functional Limits LUE Assessment LUE Assessment: Within Functional Limits RLE Assessment RLE Assessment: Within Functional Limits LLE Assessment LLE Assessment: Exceptions to WFL LLE AROM (degrees) LLE Overall AROM Comments: strength 3 to 3+/5 grossly  See FIM  for current functional status  [name] [facility] 06/25/2011, 10:41 AM

## 2011-06-25 NOTE — Progress Notes (Signed)
Occupational Therapy Session Notes & Discharge Summary  Patient Details  Name: Stephanie Griffith MRN: [id number] Date of Birth: [birth year]  Today's Date: 06/25/2011  SESSION NOTES  Session #1 [id number] - 60 Minutes Individual Therapy No complaints of pain, but with some c/o phantom pain in left residual limb - no medication needed; DR aware Upon entering room patient supine in bed with breakfast tray on bedside table. Patient immediatley requested to eat breakfast after therapist encouraged patient to participate in therapy. Patient supine -> edge of bed with supervision (HOB down and no bed rails) to eat breakfast. Daughter present in room for education regarding aspects of daily living. Discussed home set-up, safety with w/c, safety with transfers, and ADLs/IADLs with daughter and patient. Daughter verbalized and demonstrated understanding, patient continues to need reinforcement; patient at an overall supervision level for discharge. Sat at sink for ADL. Focused overall skilled therapy on education to daughter, UB/LB bathing & dressing in sit -> stand position, overall activity tolerance/endurance, grooming tasks seated at sink in w/c, dynamic sitting balance, and d/c planning with patient & daughter.   Session #2 [id number] - 40 Minutes Individual Therapy No complaints of pain, but patient with c/o phantom pain throughout session Upon entering room patient sitting up in bed. Therapist encouraged patient to engage in IADL task in kitchen secondary to goals and discharge -> home tomorrow, however patient refused stating "If I can't do it when I get home, I'll get someone to do it for me!". Therapist continued to encourage patient, patient continued to refuse and stated "I want to work with weights!". Patient willing to transfer from edge of bed -> w/c for UE/UB exercises using 3lb dumbbells. Focused on bicep and tricep exercises. Left patient seated in w/c beside bed with call bell and phone within  reach.   DISCHARGE SUMMARY Patient has met 7 of 11 long term goals due to improved activity tolerance, improved balance, ability to compensate for deficits, improved awareness and improved coordination.  Patient to discharge at overall Supervision level -> daughters house.  Patient's care partner is independent to provide the necessary supervision prn at discharge.    Reasons goals not met: Patient refused any IADL tasks. Patient continuously stated "No, I want to work with weights!" and "If I can't do it when I get home, I'll get someone to do it for me!". Patient also did not meet tub/shower goal secondary to goal not worked on secondary to patient will not be able to get into bathroom with shower at home (full bath upstairs in house & half bath downstairs).   Recommendation:  Patient will benefit from ongoing skilled OT services in home health setting to continue to advance functional skills in the area of BADL and iADL.  Equipment: Drop Arm BSC; patient unable to get into full bathroom at daughters house. Therefore, no shower equipment recommended at this time; maybe at a later time if moves back to own house.   Reasons for discharge: discharge from hospital  Patient/family agrees with progress made and goals achieved: Yes  Precautions/Restrictions  Precautions Precautions: Fall Required Braces or Orthoses: No Restrictions Weight Bearing Restrictions: Yes LLE Weight Bearing: Non weight bearing Other Position/Activity Restrictions: left AKA  Pain Pain Assessment Pain Assessment: No/denies pain (patient did have c/o phantom pain in left residual limb) Pain Score: 0-No pain Faces Pain Scale: No hurt  ADL - See FIM  Vision/Perception  Vision - History Baseline Vision: Wears glasses all the time Patient Visual Report: No change from  baseline Vision - Assessment Eye Alignment: Within Functional Limits   Cognition Overall Cognitive Status: Appears within functional limits for  tasks assessed Arousal/Alertness: Awake/alert Orientation Level: Oriented X4  Sensation Sensation Light Touch: Appears Intact Coordination Gross Motor Movements are Fluid and Coordinated: Yes Fine Motor Movements are Fluid and Coordinated: Yes  Extremity/Trunk Assessment RUE Assessment RUE Assessment: Within Functional Limits LUE Assessment LUE Assessment: Within Functional Limits  See FIM for current functional status  Stephanie Griffith 06/25/2011, 9:15 AM

## 2011-06-25 NOTE — Progress Notes (Signed)
Stephanie Griffith   MRN: [id number]  Subjective/Complaints: Review of Systems  Gastrointestinal: Negative for constipation.  Musculoskeletal: Positive for joint pain.  All other systems reviewed and are negative.  3/25- having some phantom limb sensations but otherwise doing well.  BP Readings from Last 3 Encounters:  06/25/11 131/80  06/15/11 144/74  06/15/11 144/74     Objective: Vital Signs: Blood pressure 131/80, pulse 74, temperature 97.8 F (36.6 C), temperature source Oral, resp. rate 16, height 5\' 8"  (1.727 m), weight 84.2 kg (185 lb 10 oz), SpO2 99.00%. No results found. No results found for this basename: WBC:2,HGB:2,HCT:2,PLT:2 in the last 72 hours No results found for this basename: NA:2,K:2,CL:2,CO2:2,GLUCOSE:2,BUN:2,CREATININE:2,CALCIUM:2 in the last 72 hours CBG (last 3)  No results found for this basename: GLUCAP:3 in the last 72 hours  Wt Readings from Last 3 Encounters:  06/24/11 84.2 kg (185 lb 10 oz)  06/15/11 93.1 kg (205 lb 4 oz)  06/15/11 93.1 kg (205 lb 4 oz)    Physical Exam:  General appearance: alert, cooperative and no distress Head: Normocephalic, without obvious abnormality, atraumatic Eyes: conjunctivae/corneas clear. PERRL, EOM's intact. Fundi benign. Ears: normal TM's and external ear canals both ears Nose: Nares normal. Septum midline. Mucosa normal. No drainage or sinus tenderness. Throat: lips, mucosa, and tongue normal; teeth and gums normal Neck: no adenopathy, no carotid bruit, no JVD, supple, symmetrical, trachea midline and thyroid not enlarged, symmetric, no tenderness/mass/nodules Back: symmetric, no curvature. ROM normal. No CVA tenderness. Resp: clear to auscultation bilaterally Cardio: regular rate and rhythm, S1, S2 normal, no murmur, click, rub or gallop GI: soft, non-tender; bowel sounds normal; no masses,  no organomegaly-large umbilical hernia Extremities: extremities normal, atraumatic, no cyanosis or edema Pulses: 2+ and  symmetric Skin: Skin color, texture, turgor normal. No rashes or lesions Neurologic: Strength symmetrical. A little anxious and impulsive but otherwise at cognitive baseline. Sensory exam grossly intact. Strength grossly 5/5 ue and 4/5 le Incision/Wound: wounds all clean without emerging granulation and decreased necrotic material. 3/25 exam  Assessment/Plan: 1. 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 Physiatrist is providing close team supervision and 24 hour management of active medical problems listed below. Physiatrist and rehab team continue to assess barriers to discharge/monitor patient progress toward functional and medical goals.  Family ed  FIM: FIM - Bathing Bathing Steps Patient Completed: Chest;Right Arm;Left Arm;Abdomen;Front perineal area;Right upper leg;Left upper leg;Right lower leg (including foot) Bathing: 4: Steadying assist  FIM - Upper Body Dressing/Undressing Upper body dressing/undressing steps patient completed: Thread/unthread right sleeve of pullover shirt/dresss;Thread/unthread left sleeve of pullover shirt/dress;Put head through opening of pull over shirt/dress;Pull shirt over trunk Upper body dressing/undressing: 5: Set-up assist to: Obtain clothing/put away FIM - Lower Body Dressing/Undressing Lower body dressing/undressing steps patient completed: Thread/unthread left pants leg Lower body dressing/undressing: 3: Mod-Patient completed 50-74% of tasks  FIM - Toileting Toileting steps completed by patient: Adjust clothing prior to toileting;Performs perineal hygiene;Adjust clothing after toileting Toileting: 4: Steadying assist  FIM - Radio producer Devices: Bedside commode Toilet Transfers: 4-To toilet/BSC: Min A (steadying Pt. > 75%);4-From toilet/BSC: Min A (steadying Pt. > 75%)  FIM - Bed/Chair Transfer Bed/Chair  Transfer Assistive Devices: Bed rails Bed/Chair Transfer: 5: Supine > Sit: Supervision (verbal cues/safety issues);4: Bed > Chair or W/C: Min A (steadying Pt. > 75%)  FIM - Locomotion: Wheelchair Distance: 30' (patient reporting fatigue with limited participation) Locomotion: Wheelchair: 0: Activity did  not occur FIM - Locomotion: Ambulation Locomotion: Ambulation Assistive Devices: Walker - Rolling Ambulation/Gait Assistance: 4: Min assist Locomotion: Ambulation: 1: Travels less than 50 ft with minimal assistance (Pt.>75%)  Comprehension Comprehension Mode: Auditory Comprehension: 5-Follows basic conversation/direction: With no assist  Expression Expression Mode: Verbal Expression Assistive Devices: 6-Other (Comment) Expression: 5-Expresses basic needs/ideas: With extra time/assistive device  Social Interaction Social Interaction Mode: Asleep Social Interaction: 5-Interacts appropriately 90% of the time - Needs monitoring or encouragement for participation or interaction.  Problem Solving Problem Solving Mode: Asleep Problem Solving: 5-Solves basic problems: With no assist  Memory Memory Mode: Asleep Memory Assistive Devices: Memory book Memory: 5-Recognizes or recalls 90% of the time/requires cueing < 10% of the time  1. 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 fields 2. DVT Prophylaxis/Anticoagulation: Subcutaneous Lovenox. Monitor platelet counts and any signs of bleeding  3. Pain Management: Oxycodone as needed. Monitor mental status. Stable at present  -some pain is "exercise" soreness and i encouraged the patient to work through it  -having some phantom limb sensations but not significant pain 4. Postoperative atrial fibrillation. Patient converted to normal sinus rhythm.  Followup cardiology services  5. Congestive heart failure. Weigh patient daily and monitor for any signs of fluid overload.- exam clear  6. Chronic renal insufficiency. Baseline creatinine 1.9. Strict I&O. HCTZ 25 mg daily that patient was on prior to admission held 06/14/2011 due to increasing renal function. Weight down  7. Hypertension. Norvasc 10 mg daily, Coreg 25 mg twice daily with reasonable control. 8. Hyperlipidemia. Lipitor  9. Multiple gallstones/cholelithiasis/ventral hernia. Plan followup per Gen. surgery once patient stabilized from vascular issues to consider laparoscopic cholecystectomy and hernia repair  10. Wound care-groin incisions intact. Collagenase per surgery- may be able to d/c this soon. -continue allevyn dressing to AK incision-looks good 11. C Diff diarrhea- flagyl for 10 days. Sx improving already. Some nausea with med. Needs to take with food.  -wbc's have decreased  --recheck labs  LOS (Days) 10 A FACE TO FACE EVALUATION WAS PERFORMED  Taelon Bendorf T 06/25/2011, 8:15 AM

## 2011-06-25 NOTE — Progress Notes (Signed)
Oxycodone IR 10 mg po given to patient per her request for level 8 lt leg pain.  Patient states, "It would be all right if I didn't think my leg was still here".  Attempted to reassure patient that it will take some time before that feeling disappears.  Patient nodded in agreement.

## 2011-06-25 NOTE — Progress Notes (Signed)
Per State Regulation 482.30 This chart was reviewed for medical necessity with respect to the patient's Admission/Duration of stay. Pt participating w/ txs.  Daughter here for family ed & says she feels ready for pt to d/c to her care.  Pt's d/c is scheduled for tomorrow w/ HH f/up.   Theora Master                 Nurse Care Manager             Next Review Date: none

## 2011-06-25 NOTE — Progress Notes (Signed)
Patient voided unknown amount.  PVR scan results are 5 mi.

## 2011-06-26 MED ORDER — OXYCODONE HCL 5 MG PO TABS: 5.0000 mg | ORAL_TABLET | ORAL | Status: AC | PRN

## 2011-06-26 MED ORDER — METRONIDAZOLE 500 MG PO TABS: 500.0000 mg | ORAL_TABLET | Freq: Three times a day (TID) | ORAL | Status: AC

## 2011-06-26 MED ORDER — NICOTINE 21 MG/24HR TD PT24: MEDICATED_PATCH | TRANSDERMAL | Status: DC

## 2011-06-26 MED ORDER — AMLODIPINE BESYLATE 10 MG PO TABS: 10.0000 mg | ORAL_TABLET | Freq: Every day | ORAL | Status: DC

## 2011-06-26 MED ORDER — CARVEDILOL 25 MG PO TABS: 25.0000 mg | ORAL_TABLET | Freq: Two times a day (BID) | ORAL | Status: DC

## 2011-06-26 MED ORDER — ATORVASTATIN CALCIUM 40 MG PO TABS: 40.0000 mg | ORAL_TABLET | Freq: Every day | ORAL | Status: DC

## 2011-06-26 MED ORDER — FAMOTIDINE 20 MG PO TABS: 20.0000 mg | ORAL_TABLET | Freq: Every day | ORAL | Status: DC

## 2011-06-26 NOTE — Progress Notes (Signed)
Social Work  Discharge Note  The overall goal for the admission was met for:   Discharge location: Yes - home with daughter to provide 24/7 assistance  Length of Stay: Yes - 11 days  Discharge activity level: Yes - supervision  Home/community participation: Yes  Services provided included: MD, RD, PT, OT, RN, CM, TR, Pharmacy and [place]: Private Insurance: [facility]  Follow-up services arranged: Home Health: RN, PT, OT via [place], DME: 18x18 [place] w/c, cushion, walker, drop arm commode via [facility] and Patient/Family has no preference for HH/DME agencies  Comments (or additional information):  Patient/Family verbalized understanding of follow-up arrangements: Yes  Individual responsible for coordination of the follow-up plan: pt and daughter  Confirmed correct DME delivered: Lennart Pall 06/26/2011    Tomorrow [place]

## 2011-06-26 NOTE — Discharge Summary (Signed)
NAME:  Stephanie Griffith, Stephanie Griffith NO.:  [id number]  MEDICAL RECORD NO.:  [id number]  LOCATION:  [id number]                         FACILITY:  [facility]  PHYSICIAN:  Meredith Staggers, M.D.DATE OF BIRTH:  1945/04/07  DATE OF ADMISSION:  06/15/2011 DATE OF DISCHARGE:                              DISCHARGE SUMMARY   DISCHARGE DIAGNOSES: 1. 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 2. Subcutaneous Lovenox for deep vein thrombosis prophylaxis. 3. Pain management. 4. Postoperative atrial fibrillation. 5. Congestive heart failure. 6. Chronic renal insufficiency. 7. Hypertension. 8. Hyperlipidemia. 9. Positive Clostridium difficile. 10.Multiple gallstones with cholelithiasis and ventral hernia.  This is a 66 year old right-handed female with history of hypertension, chronic renal disease with baseline of 1.9, as well as recent left big toe infection with toenail removal by podiatry and antibiotic use as an outpatient.  The patient admitted on May 31, 2011 with nausea as well as abdominal pain and white blood cell count of 17,000, creatinine 2.97.  The patient with dry gangrene of left lower extremity, superimposed cellulitis, placed on intravenous antibiotics.  Lower extremity arterial Dopplers performed revealed ABI is 0.17 in the left anterior tibial artery and 1.0 in the right lower extremity. Angiography of the left lower extremity on June 04, 2011 showed moderate stenosis in the proximal superficial femoral artery.  There was moderate disease at the adductor canal, which occludes with reconstitution above the knee popliteal artery.  Underwent right femoral to below-knee popliteal bypass on June 06, 2011 by Dr. Oneida Alar and transmetatarsal amputation of the left first toe.  The patient extubated in the operating room, but noted to have labored shallow breathing in PACU. Chest x-ray revealed right  greater than left airspace disease.  Critical Care Medicine consulted, placed on BiPAP for short time.  Antibiotics for questionable aspiration pneumonia were added.  Cardiac enzymes were obtained showing mild elevations in CK, but negative MB.  Seen by Cardiology Services for new-onset atrial fibrillation with rapid ventricular heart rate, 160s-180s.  She denied any chest pain or dizziness.  She converted to normal sinus rhythm at heart rate remaining slightly elevated, 90s-100s.  Dr. Burt Knack of Cardiology Services consulted, felt atrial fibrillation related to acute stress and illness. Echocardiogram completed showing ejection fraction of 65% and grade 1 diastolic dysfunction.  The patient received intravenous metoprolol for short time.  Advised to avoid digoxin given inconsistent renal dysfunction.  It was advised to continue aspirin therapy.  There was some question of possible fluid overload by chest x-ray as weight had slightly increased.  She was placed for a short time on intravenous Lasix with close monitoring of renal function.  In regards to her initial abdominal pain and nausea, CT of the abdomen and pelvis were completed showing multiple gallstones and some thickening of the gallbladder wall as well as ventral hernia.  General Surgery, Dr. Excell Seltzer, was consulted, noted plan for laparoscopic cholecystectomy and repair of ventral hernia once the patient medically stable from vascular issues.  The patient with ongoing gangrenous change of left lower extremity and underwent left above-knee amputation on June 12, 2011  by Dr. Oneida Alar, postoperative pain management as well as subcutaneous Lovenox for deep vein thrombosis prophylaxis.  The patient was admitted for comprehensive rehab program.  PAST MEDICAL HISTORY:  See discharge diagnoses.  SOCIAL HISTORY:  Lives with spouse.  She plans to stay with her daughter who can provide assistance.  One-level home, 3 steps to  entry.  FUNCTIONAL HISTORY:  Prior to admission was independent.  Retired.  She does not drive.  FUNCTIONAL STATUS:  Upon admission to rehab services was +2 total assist supine to sit, +2 total assist stand pivot transfers, minimal assist ambulate 16 feet with a rolling walker.  PHYSICAL EXAMINATION:  VITAL SIGNS:  Blood pressure 144/74, pulse 69, temperature 97.2, respirations 18. GENERAL:  This was an alert female, oriented x3, normocephalic, well developed. LUNGS:  Clear to auscultation. CARDIAC RATE:  Regular rate and rhythm. ABDOMEN:  Soft, nontender.  No guarding.  Good bowel sounds. EXTREMITIES:  Incision site clean and dry with dressing in place.  A long amputation site left thigh incision clean with staples and inguinal area with little moist, but intact.  Right lower extremity with some calluses on the foot, but intact.  She did have onychomycotic nails.  REHABILITATION HOSPITAL COURSE:  The patient was admitted to Inpatient Rehab Services with therapies initiated on a 3-hour daily basis consisting of physical therapy, occupational therapy, and rehabilitation nursing.  The following issues were addressed during the patient's rehabilitation stay.  Pertaining to [name] left above-knee amputation on June 12, 2011, surgical site healing nicely.  She would follow up with Dr. Oneida Alar as well as arrangements made in the [facility] with Dr. Naaman Plummer.  No shrinker yet to the left leg until the wound is fully healed.  Dressing wounds to right femoral to below-knee popliteal bypass site ongoing.  She received followup by Vascular Surgery in regards to her wound care.  She was currently receiving Santyl to left thigh daily, covered with dry gauze, normal saline wet-to- dry left groin 3 times a day, washing wounds of left leg with soap and water daily.  She had no fever and was monitored ever so closely.  Pain management with the use of oxycodone and good results.  Chronic  renal insufficiency, creatinine of 1.9.  This did remain stable.  Blood pressures controlled with Coreg with no orthostatic changes as well as Norvasc.  Subcutaneous Lovenox ongoing for deep vein thrombosis prophylaxis throughout her rehab course.  She was placed on a NicoDerm patch for tobacco abuse.  During her rehabilitation course, noted positive Clostridium difficile.  Placed on Flagyl times a 10-day course. Her loose stools had greatly improved.  Noted findings of multiple gallstones, cholelithiasis, and ventral hernia during her initial admission of abdominal pain.  Plan was to follow up with General Surgery, Dr. Excell Seltzer, in regards to follow up possible laparoscopic cholecystectomy and repair of hernia once stabilized from her vascular issues.  The patient received weekly collaborative interdisciplinary team conferences to discuss estimated length of stay, family teaching, and any barriers to her discharge.  She was now continent of bowel and bladder.  Overall, minimal assist for activities of daily living, minimum to moderate assist transfers, minimum to moderate assist overall for functional mobility.  Her strength and endurance had greatly improved throughout her rehab course.  Family teaching was completed with her daughter who should be discharged to home with and could provide 24-hour assistance in care.  LATEST LABORATORY DATA:  Sodium 131, potassium 4.5, BUN 22, creatinine 1.66.  Hemoglobin of 9.8, hematocrit 30.6, WBC 14.1, platelets 630,000.  DISCHARGE MEDICATIONS:  At the time of dictation included: 1. Norvasc 10 mg p.o. daily. 2. Aspirin 81 mg daily. 3. Lipitor 40 mg p.o. daily. 4. Coreg 25 mg p.o. b.i.d. 5. Pepcid 20 mg p.o. daily. 6. Flagyl 500 mg p.o. every 8 hours times a 10-day course to complete     on June 26, 2011. 7. NicoDerm patch, taper as directed. 8. Oxycodone immediate release 5 mg 1-2 tablets every 4 hours as     needed pain, dispense of 90  tablets.  DIET:  Regular diet.  SPECIAL INSTRUCTIONS:  The patient should follow up with Dr. Oneida Alar in regards to removal of staples from the amputation site in the next 2 weeks.  Wound care consisted of Santyl to left thigh, could daily cover with dry gauze.  Normal saline wet-to-dry left groin 3 times daily. Wash wounds in left leg with soap and water daily.  Call for any increased redness, drainage, or fever.  Follow up with Dr. Excell Seltzer in relation to laparoscopic cholecystectomy, ventral hernia repair once stabilized from vascular issues.  She was to follow up with Dr. Alger Simons, the outpatient rehab service office, on Aug 13, 2011.  Home health therapies had been arranged.     Lauraine Rinne, P.A.   ______________________________ Meredith Staggers, M.D.    DA/MEDQ  D:  06/26/2011  T:  06/26/2011  Job:  [id number]  cc:   Meredith Staggers, M.D. Jessy Oto. Oneida Alar, MD Ashby Dawes. Polite, M.D. Darene Lamer. Hoxworth, M.D.

## 2011-06-26 NOTE — Progress Notes (Signed)
Patient's last BM 06/21/2011.  Sorbitol given at 1700 (06/25/11) by previous nurse, no result at this time.  Offered suppository x 2, patient continue to refuse.

## 2011-06-26 NOTE — Progress Notes (Signed)
Patient is d/c today with assessments remaining unchanged, incision site without s/s of infection. wheeeled down by the tech accompanied by daughter. D/c instructions given.

## 2011-06-26 NOTE — Progress Notes (Signed)
Patient ID: Stephanie Griffith, female   DOB: 1945/07/18, 66 y.o.   MRN: [id number] P   MRN: [id number]  Subjective/Complaints: Review of Systems  Gastrointestinal: Negative for constipation.  Musculoskeletal: Positive for joint pain.  All other systems reviewed and are negative.  3/26- no new complaints.  BP Readings from Last 3 Encounters:  06/26/11 143/78  06/15/11 144/74  06/15/11 144/74     Objective: Vital Signs: Blood pressure 143/78, pulse 77, temperature 99.3 F (37.4 C), temperature source Oral, resp. rate 20, height 5\' 8"  (1.727 m), weight 84.4 kg (186 lb 1.1 oz), SpO2 100.00%. No results found.  Basename 06/25/11 1044  WBC 14.1*  HGB 9.8*  HCT 30.6*  PLT 630*    Basename 06/25/11 1044  NA 131*  K 4.5  CL 96  CO2 24  GLUCOSE 138*  BUN 22  CREATININE 1.66*  CALCIUM 9.1   CBG (last 3)  No results found for this basename: GLUCAP:3 in the last 72 hours  Wt Readings from Last 3 Encounters:  06/26/11 84.4 kg (186 lb 1.1 oz)  06/15/11 93.1 kg (205 lb 4 oz)  06/15/11 93.1 kg (205 lb 4 oz)    Physical Exam:  General appearance: alert, cooperative and no distress Head: Normocephalic, without obvious abnormality, atraumatic Eyes: conjunctivae/corneas clear. PERRL, EOM's intact. Fundi benign. Ears: normal TM's and external ear canals both ears Nose: Nares normal. Septum midline. Mucosa normal. No drainage or sinus tenderness. Throat: lips, mucosa, and tongue normal; teeth and gums normal Neck: no adenopathy, no carotid bruit, no JVD, supple, symmetrical, trachea midline and thyroid not enlarged, symmetric, no tenderness/mass/nodules Back: symmetric, no curvature. ROM normal. No CVA tenderness. Resp: clear to auscultation bilaterally Cardio: regular rate and rhythm, S1, S2 normal, no murmur, click, rub or gallop GI: soft, non-tender; bowel sounds normal; no masses,  no organomegaly-large umbilical hernia Extremities: extremities normal, atraumatic, no cyanosis or  edema Pulses: 2+ and symmetric Skin: Skin color, texture, turgor normal. No rashes or lesions Neurologic: Strength symmetrical. A little anxious and impulsive but otherwise at cognitive baseline. Sensory exam grossly intact. Strength grossly 5/5 ue and 4/5 le Incision/Wound: wounds all clean without emerging granulation and decreased necrotic material. 3/25 exam  Assessment/Plan: 1. 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 Physiatrist is providing close team supervision and 24 hour management of active medical problems listed below. Physiatrist and rehab team continue to assess barriers to discharge/monitor patient progress toward functional and medical goals.  Family ed  FIM: FIM - Bathing Bathing Steps Patient Completed: Chest;Right Arm;Left Arm;Abdomen;Front perineal area;Buttocks;Right upper leg;Left upper leg;Right lower leg (including foot) Bathing: 5: Supervision: Safety issues/verbal cues  FIM - Upper Body Dressing/Undressing Upper body dressing/undressing steps patient completed: Thread/unthread right sleeve of pullover shirt/dresss;Thread/unthread left sleeve of pullover shirt/dress;Put head through opening of pull over shirt/dress;Pull shirt over trunk Upper body dressing/undressing: 7: Complete Independence: No helper FIM - Lower Body Dressing/Undressing Lower body dressing/undressing steps patient completed: Thread/unthread right pants leg;Thread/unthread left pants leg;Pull pants up/down;Don/Doff right sock Lower body dressing/undressing: 5: Supervision: Safety issues/verbal cues  FIM - Toileting Toileting steps completed by patient: Adjust clothing prior to toileting;Performs perineal hygiene;Adjust clothing after toileting Toileting: 4: Steadying assist  FIM - Radio producer Devices: Bedside commode Toilet Transfers: 4-To toilet/BSC: Min  A (steadying Pt. > 75%);4-From toilet/BSC: Min A (steadying Pt. > 75%)  FIM - Bed/Chair Transfer Bed/Chair Transfer Assistive Devices: Bed rails  Bed/Chair Transfer: 6: Supine > Sit: No assist;6: Sit > Supine: No assist;5: Bed > Chair or W/C: Supervision (verbal cues/safety issues);5: Chair or W/C > Bed: Supervision (verbal cues/safety issues)  FIM - Locomotion: Wheelchair Distance: 30' (patient reporting fatigue with limited participation) Locomotion: Wheelchair: 5: Travels 150 ft or more: maneuvers on rugs and over door sills with supervision, cueing or coaxing FIM - Locomotion: Ambulation Locomotion: Ambulation Assistive Devices: Administrator Ambulation/Gait Assistance: 4: Min assist Locomotion: Ambulation: 1: Travels less than 50 ft with minimal assistance (Pt.>75%)  Comprehension Comprehension Mode: Auditory Comprehension: 5-Understands complex 90% of the time/Cues < 10% of the time  Expression Expression Mode: Verbal Expression Assistive Devices: 6-Other (Comment) Expression: 5-Expresses complex 90% of the time/cues < 10% of the time  Social Interaction Social Interaction Mode: Asleep Social Interaction: 5-Interacts appropriately 90% of the time - Needs monitoring or encouragement for participation or interaction.  Problem Solving Problem Solving Mode: Asleep Problem Solving: 5-Solves complex 90% of the time/cues < 10% of the time  Memory Memory Mode: Asleep Memory Assistive Devices: Memory book Memory: 5-Recognizes or recalls 90% of the time/requires cueing < 10% of the time  1. 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 fields 2. DVT Prophylaxis/Anticoagulation: Subcutaneous Lovenox. Monitor platelet counts and any signs of bleeding  3. Pain Management: Oxycodone as needed.  Monitor mental status. Stable at present  -some pain is "exercise" soreness and i encouraged the patient to work through it  -having some phantom limb sensations but not significant pain 4. Postoperative atrial fibrillation. Patient converted to normal sinus rhythm. Followup cardiology services  5. Congestive heart failure. Weigh patient daily and monitor for any signs of fluid overload.- exam clear  6. Chronic renal insufficiency. Baseline creatinine 1.9. Strict I&O. HCTZ 25 mg daily that patient was on prior to admission held 06/14/2011 due to increasing renal function. Weight down  7. Hypertension. Norvasc 10 mg daily, Coreg 25 mg twice daily with reasonable control. 8. Hyperlipidemia. Lipitor  9. Multiple gallstones/cholelithiasis/ventral hernia. Plan followup per Gen. surgery once patient stabilized from vascular issues to consider laparoscopic cholecystectomy and hernia repair  10. Wound care-groin incisions intact. Collagenase per surgery-   -HHRN to f/u groin wounds  -stump wound looks great/ staples out per CVTS 11. C Diff diarrhea- flagyl for 10 days. Sx improving already. Some nausea with med. Needs to take with food.  -wbc's have been decreased to stable  -no active signs of infection  LOS (Days) 11 A FACE TO FACE EVALUATION WAS PERFORMED  Sarinity Dicicco T 06/26/2011, 7:50 AM

## 2011-06-26 NOTE — Discharge Instructions (Signed)
Inpatient Rehab Discharge Instructions  Jahlea Kurpiewski Discharge date and time: No discharge date for patient encounter.   Activities/Precautions/ Functional Status: Activity: activity as tolerated Diet: regular diet Wound Care: Santyl to left thigh daily cover dry gauze. Normal saline wet to dry left groin 3 times daily. Wash wounds left leg with soap and water daily Functional status:  ___ No restrictions     ___ Walk up steps independently ___ 24/7 supervision/assistance   ___ Walk up steps with assistance ___ Intermittent supervision/assistance  ___ Bathe/dress independently ___ Walk with walker     _x__ Bathe/dress with assistance ___ Walk Independently    ___ Shower independently ___ Walk with assistance    _x__ Shower with assistance ___ No alcohol     ___ Return to work/school ________   COMMUNITY REFERRALS UPON DISCHARGE:    Home Health:   PT    OT    RN                    Agency: [facility] Phone: [phone]   Medical Equipment/Items Ordered: wheelchair, cushion, walker, drop arm commode                                                                  Agency/Supplier: [facility] RESOURCES FOR PATIENT/FAMILY: Support Groups: Amputee Support Group   Caregiver Support: Same as above      Special Instructions: Followup Dr. Oneida Alar in respects to staple removal amputation site and left groin wounds.  Followup Dr. Excell Seltzer general surgery [phone] in regards to possible need for laparoscopic cholecystectomy and ventral hernia repair once stabilized from vascular issues  My questions have been answered and I understand these instructions. I will adhere to these goals and the provided educational materials after my discharge from the hospital.  Patient/Caregiver Signature _______________________________ Date __________  Clinician Signature _______________________________________ Date __________  Please bring this form and your medication  list with you to all your follow-up doctor's appointments.

## 2011-07-03 ENCOUNTER — Other Ambulatory Visit: Payer: Self-pay | Admitting: Internal Medicine

## 2011-07-03 DIAGNOSIS — Z1231 Encounter for screening mammogram for malignant neoplasm of breast: Secondary | ICD-10-CM

## 2011-07-12 ENCOUNTER — Encounter: Payer: Self-pay | Admitting: Physical Medicine & Rehabilitation

## 2011-07-17 ENCOUNTER — Ambulatory Visit
Admission: RE | Admit: 2011-07-17 | Discharge: 2011-07-17 | Disposition: A | Discharge status: Home / Self Care | Payer: Medicare Other | Source: Ambulatory Visit | Attending: Internal Medicine | Admitting: Internal Medicine

## 2011-07-17 DIAGNOSIS — Z1231 Encounter for screening mammogram for malignant neoplasm of breast: Secondary | ICD-10-CM

## 2011-07-25 ENCOUNTER — Encounter: Payer: Self-pay | Admitting: Vascular Surgery

## 2011-07-26 ENCOUNTER — Encounter: Payer: Self-pay | Admitting: Vascular Surgery

## 2011-07-26 ENCOUNTER — Ambulatory Visit (INDEPENDENT_AMBULATORY_CARE_PROVIDER_SITE_OTHER): Payer: Medicare Other | Admitting: Vascular Surgery

## 2011-07-26 VITALS — BP 126/64 | HR 71 | Temp 98.3°F | Ht 68.0 in | Wt 173.0 lb

## 2011-07-26 DIAGNOSIS — I739 Peripheral vascular disease, unspecified: Secondary | ICD-10-CM | POA: Insufficient documentation

## 2011-07-26 MED ORDER — OXYCODONE HCL 5 MG PO TABS: ORAL_TABLET | ORAL | Status: AC

## 2011-07-26 NOTE — Progress Notes (Signed)
Patient is status post left above-knee amputation on March 12. She returns today for followup. She had a failed left lower extremity bypass. The left groin incision is now well-healed. There is a medial left thigh incision was still is open approximately 2 cm less than 1 mm in depth. The above-knee amputation is well-healed. She'll have some occasional pain.  Assessment: Healing left above-knee amputation  Plan: Continue local wound care left inner thigh. Staples were removed today. She was given a prescription today for 30 tablets oxycodone 5 mg. No further refills. She was also given a prescription today for evaluation it [employer] for prosthetic.  [name], MD [facility] Office: [phone] Pager: [phone]

## 2011-07-27 NOTE — Progress Notes (Signed)
Patient presented today with left AKA. The wound had a hard brown crust that I sprayed heavily with CarraKlenz wound cleanser and removed. There was slight bleeding during the removal. Pt did not tolerate well, crying was noted and I had to stop several times to help her through the process. All staples were removed per Dr. Oneida Alar and wound was cleaned and dried. A clean dry dressing was applied. Explained wound care and patient acknowledged understanding.

## 2011-07-31 ENCOUNTER — Other Ambulatory Visit: Payer: Self-pay | Admitting: Internal Medicine

## 2011-07-31 DIAGNOSIS — R609 Edema, unspecified: Secondary | ICD-10-CM

## 2011-08-01 ENCOUNTER — Ambulatory Visit
Admission: RE | Admit: 2011-08-01 | Discharge: 2011-08-01 | Disposition: A | Discharge status: Home / Self Care | Payer: Medicare Other | Source: Ambulatory Visit | Attending: Internal Medicine | Admitting: Internal Medicine

## 2011-08-01 DIAGNOSIS — R609 Edema, unspecified: Secondary | ICD-10-CM

## 2011-08-01 IMAGING — US US EXTREM LOW VENOUS*R*
1 series · 13 of 24 positions shown · non-contrast
Comparison: None.

CLINICAL DATA: Peripheral edema

RIGHT LOWER EXTREMITY VENOUS DUPLEX ULTRASOUND
TECHNIQUE: Gray-scale sonography with graded compression, as well
as color Doppler and duplex ultrasound were performed to evaluate
the deep venous system of the lower extremity from the level of the
common femoral vein through the popliteal and proximal calf veins.
Spectral Doppler was utilized to evaluate flow at rest and with
distal augmentation maneuvers.

[Series 1: us extrem low venous*right* · 13 of 42 slices shown]
[im 1/42]
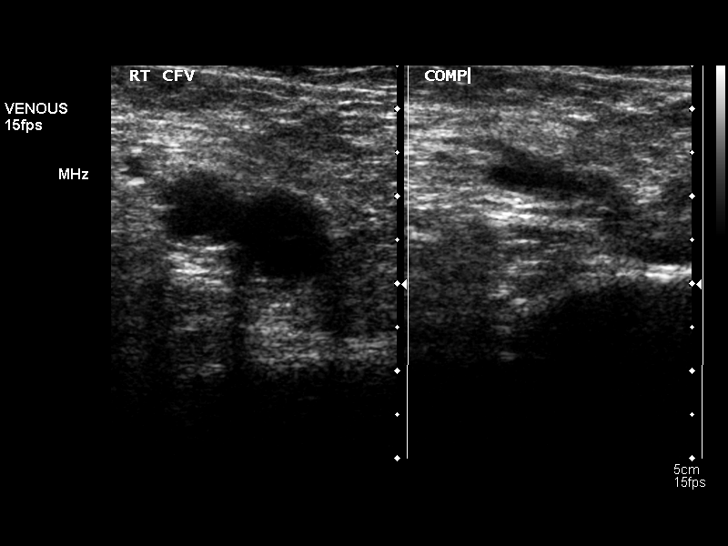
[im 4/42]
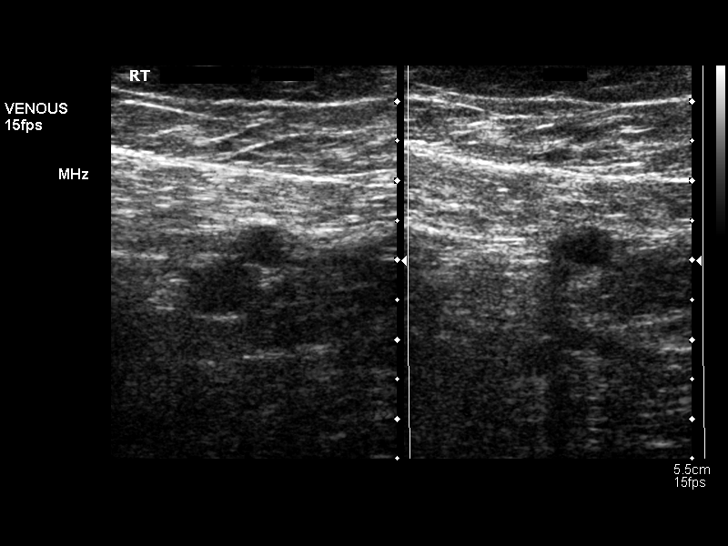
[im 8/42]
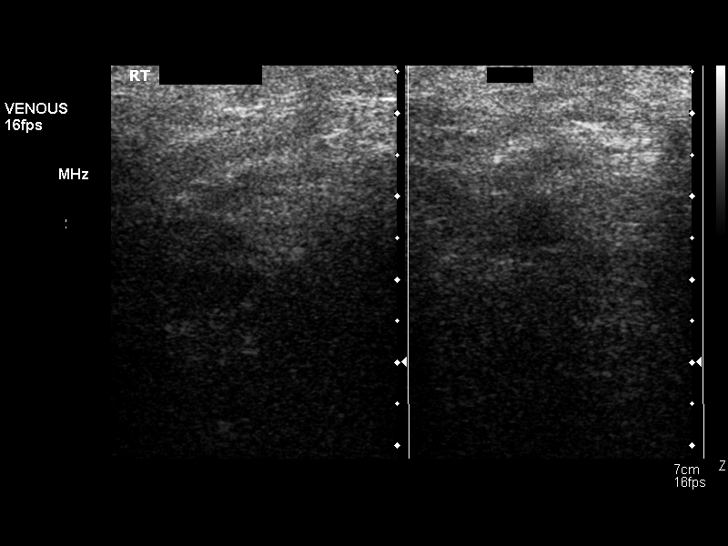
[im 11/42]
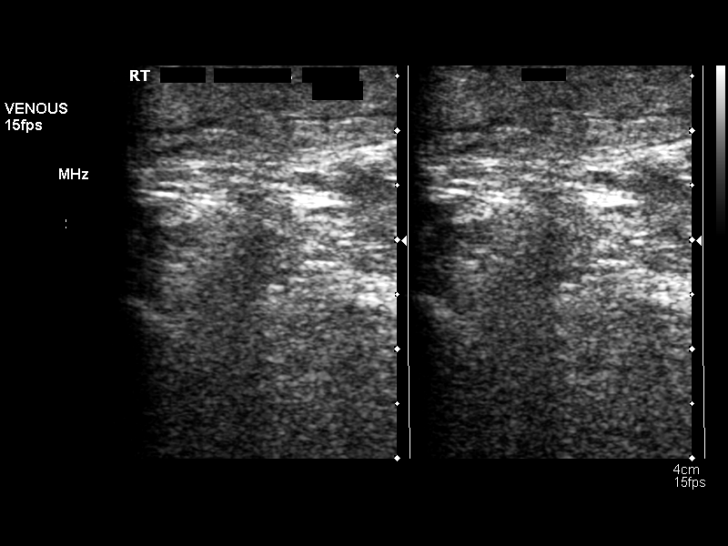
[im 15/42]
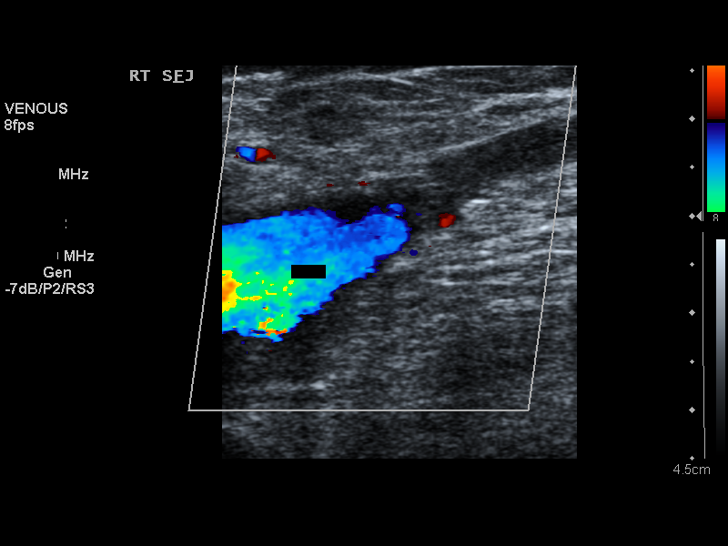
[im 18/42]
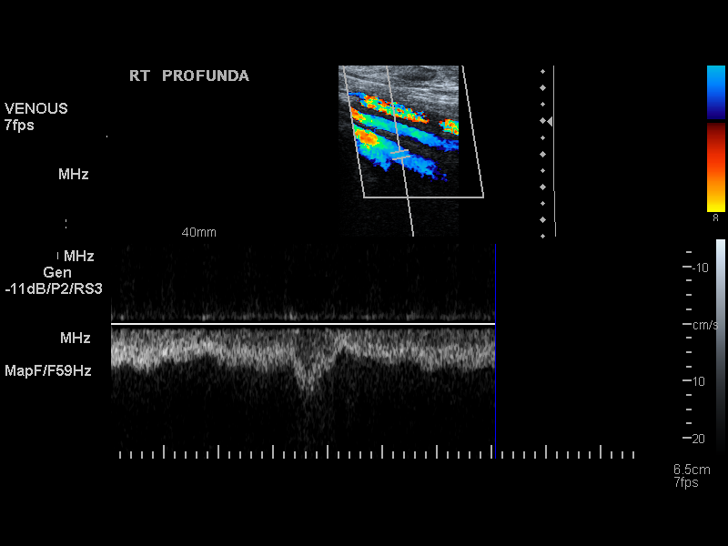
[im 22/42]
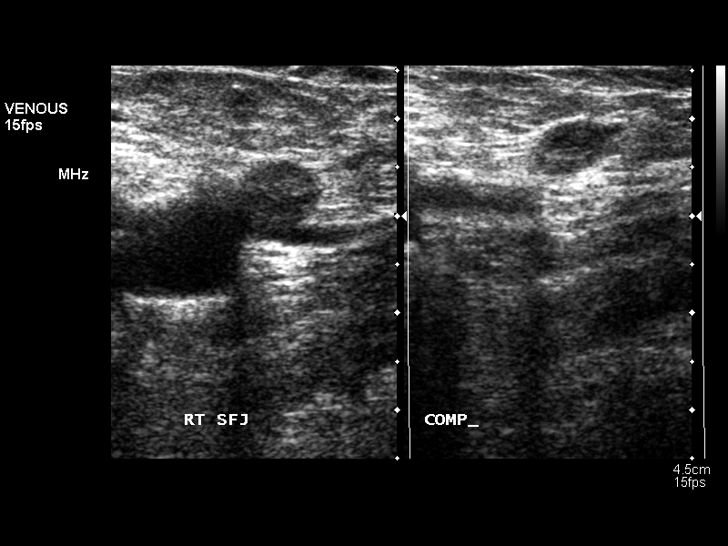
[im 24/42]
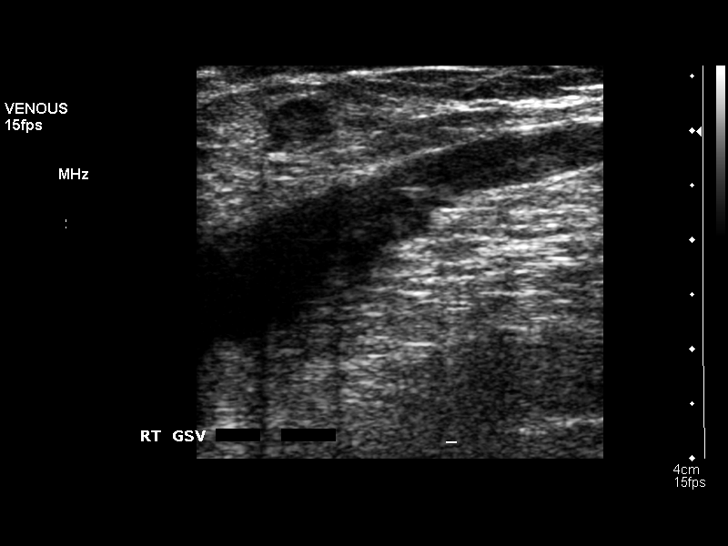
[im 27/42]
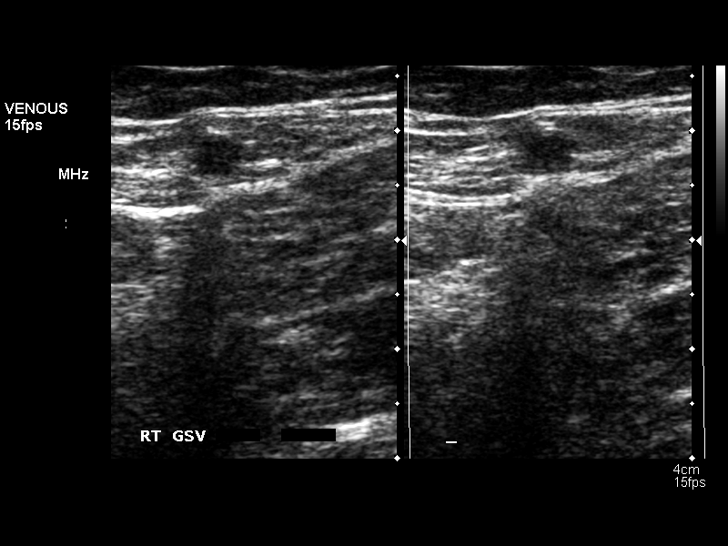
[im 31/42]
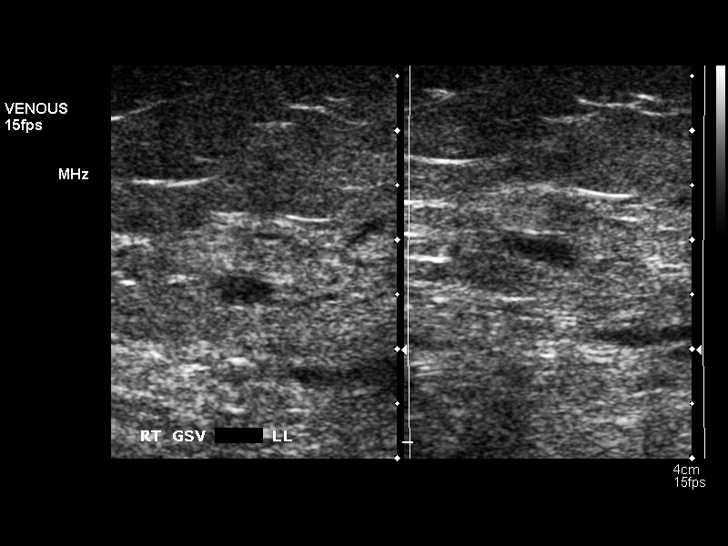
[im 34/42]
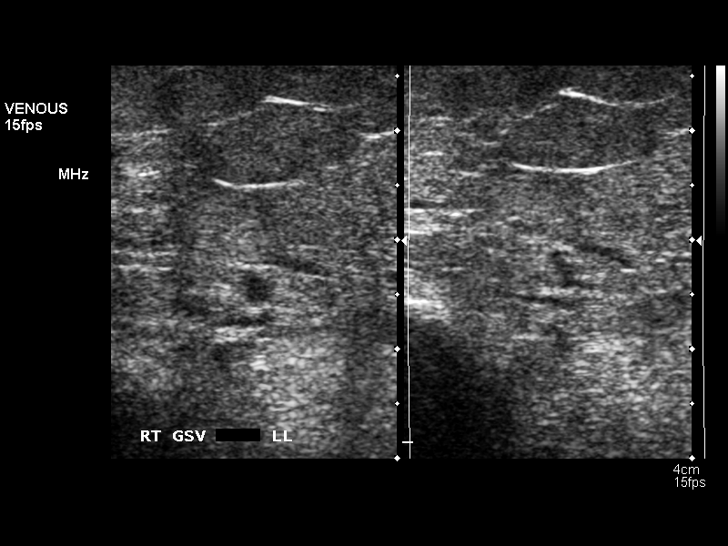
[im 38/42]
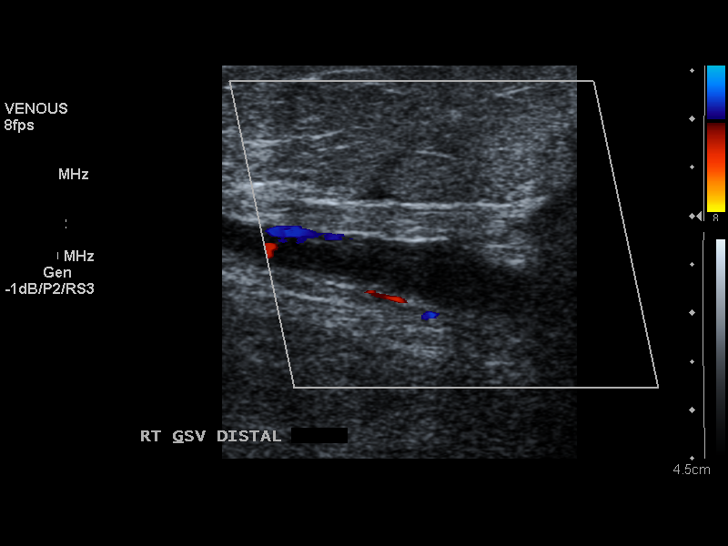
[im 42/42]
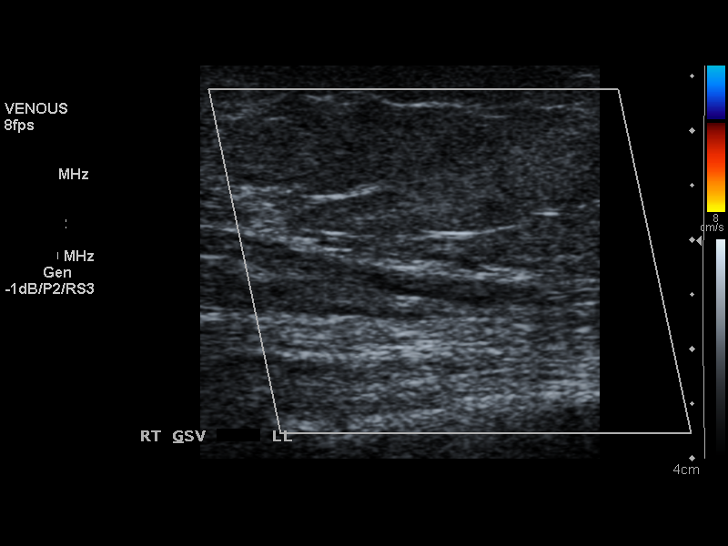

[13 of 24 positions shown; findings below may reference images not displayed]

FINDINGS: Normal compressibility of the common femoral,
superficial femoral, and popliteal veins is demonstrated, as well
as the visualized proximal calf veins.  No filling defects to
suggest DVT on grayscale or color Doppler imaging.  Doppler
waveforms show normal direction of venous flow, normal respiratory
phasicity and response to augmentation.

Proximal right G S V in the thigh region demonstrates thrombotic
occlusion from the saphenofemoral junction into the proximal thigh.
This is consistent with superficial G S V thrombosis versus
thrombophlebitis.  Subcutaneous peripheral edema noted in the ankle
and calf region.
IMPRESSION: No evidence of lower extremity deep vein thrombosis.

Proximal right G S V superficial thrombosis versus
thrombophlebitis.

## 2011-08-13 ENCOUNTER — Encounter: Payer: Self-pay | Admitting: Physical Medicine & Rehabilitation

## 2011-08-13 ENCOUNTER — Encounter: Payer: Medicare Other | Attending: Physical Medicine & Rehabilitation | Admitting: Physical Medicine & Rehabilitation

## 2011-08-13 VITALS — BP 129/50 | HR 71 | Resp 16 | Ht 68.0 in | Wt 185.0 lb

## 2011-08-13 DIAGNOSIS — Z89619 Acquired absence of unspecified leg above knee: Secondary | ICD-10-CM

## 2011-08-13 DIAGNOSIS — N189 Chronic kidney disease, unspecified: Secondary | ICD-10-CM | POA: Insufficient documentation

## 2011-08-13 DIAGNOSIS — I509 Heart failure, unspecified: Secondary | ICD-10-CM | POA: Insufficient documentation

## 2011-08-13 DIAGNOSIS — G546 Phantom limb syndrome with pain: Secondary | ICD-10-CM

## 2011-08-13 DIAGNOSIS — I129 Hypertensive chronic kidney disease with stage 1 through stage 4 chronic kidney disease, or unspecified chronic kidney disease: Secondary | ICD-10-CM | POA: Insufficient documentation

## 2011-08-13 DIAGNOSIS — S78119A Complete traumatic amputation at level between unspecified hip and knee, initial encounter: Secondary | ICD-10-CM

## 2011-08-13 DIAGNOSIS — G547 Phantom limb syndrome without pain: Secondary | ICD-10-CM | POA: Insufficient documentation

## 2011-08-13 MED ORDER — GABAPENTIN 100 MG PO CAPS: 100.0000 mg | ORAL_CAPSULE | Freq: Two times a day (BID) | ORAL | Status: DC

## 2011-08-13 NOTE — Progress Notes (Deleted)
  Subjective:    Patient ID: Stephanie Griffith, female    DOB: [birth year], 66 y.o.   MRN: [id number]  HPI    Review of Systems     Objective:   Physical Exam        Assessment & Plan:

## 2011-08-13 NOTE — Patient Instructions (Signed)
Follow up with [employer] regarding your prosthesis.

## 2011-08-13 NOTE — Progress Notes (Signed)
Subjective:    Patient ID: Stephanie Griffith, female    DOB: [birth year], 66 y.o.   MRN: [id number]  HPI  Mrs. 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 She is having frequent phantom limb pain on the left. She uses an oxycodone in the morning and in the evening. The pain is usually more severe during the day.  She is having minimal limb/incisional pain at this point.   She has had problems with lower extremity edema and has had her medications adjusted to account for this.  She hasn't used her shrinker reliably either, and now has been using it for the last week or two. She has seen [facility] for presprosthetic eval and returns to them next week apparently.  She still has an open wound on the left thigh.  Pain Inventory Average Pain 8 Pain Right Now 0 My pain is intermittent and sharp  In the last 24 hours, has pain interfered with the following? General activity 3 Relation with others 3 Enjoyment of life 3 What TIME of day is your pain at its worst? evening Sleep (in general) Good  Pain is worse with: unsure Pain improves with: medication Relief from Meds: 10  Mobility use a wheelchair needs help with transfers  Function retired I need assistance with the following:  dressing, bathing, meal prep, household duties and shopping  Neuro/Psych confusion depression  Prior Studies Any changes since last visit?  no  Physicians involved in your care Any changes since last visit?  no      Review of Systems  Constitutional: Positive for unexpected weight change.       Weight gain  Psychiatric/Behavioral: Positive for confusion and dysphoric mood.  All other systems reviewed and are negative.       Objective:   Physical Exam  Constitutional: She is oriented to person, place, and time. She appears well-developed and well-nourished.  HENT:  Head: Normocephalic and atraumatic.  Nose: Nose normal.  Mouth/Throat: Oropharynx is clear and moist.  Eyes: Conjunctivae and EOM are  normal. Pupils are equal, round, and reactive to light.  Neck: Normal range of motion.  Cardiovascular: Normal rate.   Pulmonary/Chest: Effort normal and breath sounds normal. No respiratory distress. She has no wheezes.  Abdominal: Soft. She exhibits no distension. There is no tenderness.  Musculoskeletal:       Left aka inicision well healed with a small scab along the medial aspect.  Left medial thigh incision has a small area of hypergranulation which measures about a cm in diameter.  Leg in general remains edematous at about 1++    Neurological: She is alert and oriented to person, place, and time. She has normal reflexes. No sensory deficit.       Strength is grossly normal except the left thigh which is 4 to 4+/5.           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   -she is not ready for fitting as the limb is still edematous. Needs to continue with the shrinker and follow up with [employer]. Also her wounds need to further heel.  -she is a K3 ambulator and should do well with a prosthesis. A pros rx was provided.  -i applied silver nitrate to her granulation tissue on the thigh wound. Otherwise her wounds are healing nicely.   2. Pain management.   -added neurontin 100mg  bid for phantom limb  pain- titrate as needed and tolerated  -reduce oxy ir as able 3. Postoperative atrial fibrillation.  4. Congestive heart failure.  5. Chronic renal insufficiency.  6. Hypertension.     Follow up with me in 3 months.  All questions were encouraged and answered.

## 2011-08-21 ENCOUNTER — Other Ambulatory Visit: Payer: Self-pay

## 2011-10-29 ENCOUNTER — Ambulatory Visit: Payer: Medicare Other | Attending: Vascular Surgery | Admitting: Physical Therapy

## 2011-10-29 DIAGNOSIS — M2560 Stiffness of unspecified joint, not elsewhere classified: Secondary | ICD-10-CM | POA: Insufficient documentation

## 2011-10-29 DIAGNOSIS — M6281 Muscle weakness (generalized): Secondary | ICD-10-CM | POA: Insufficient documentation

## 2011-10-29 DIAGNOSIS — IMO0001 Reserved for inherently not codable concepts without codable children: Secondary | ICD-10-CM | POA: Insufficient documentation

## 2011-10-29 DIAGNOSIS — R269 Unspecified abnormalities of gait and mobility: Secondary | ICD-10-CM | POA: Insufficient documentation

## 2011-10-29 DIAGNOSIS — R5381 Other malaise: Secondary | ICD-10-CM | POA: Insufficient documentation

## 2011-11-19 ENCOUNTER — Encounter: Payer: Medicare Other | Admitting: Physical Medicine & Rehabilitation

## 2011-12-11 ENCOUNTER — Encounter: Payer: Medicare Other | Attending: Physical Medicine & Rehabilitation | Admitting: Physical Medicine & Rehabilitation

## 2012-01-12 ENCOUNTER — Other Ambulatory Visit: Payer: Self-pay | Admitting: Physical Medicine & Rehabilitation

## 2012-02-06 ENCOUNTER — Ambulatory Visit: Payer: Medicare Other | Attending: Vascular Surgery | Admitting: Physical Therapy

## 2012-02-06 DIAGNOSIS — M2560 Stiffness of unspecified joint, not elsewhere classified: Secondary | ICD-10-CM | POA: Insufficient documentation

## 2012-02-06 DIAGNOSIS — M6281 Muscle weakness (generalized): Secondary | ICD-10-CM | POA: Insufficient documentation

## 2012-02-06 DIAGNOSIS — R5381 Other malaise: Secondary | ICD-10-CM | POA: Insufficient documentation

## 2012-02-06 DIAGNOSIS — IMO0001 Reserved for inherently not codable concepts without codable children: Secondary | ICD-10-CM | POA: Insufficient documentation

## 2012-02-06 DIAGNOSIS — S78119A Complete traumatic amputation at level between unspecified hip and knee, initial encounter: Secondary | ICD-10-CM | POA: Insufficient documentation

## 2012-02-14 ENCOUNTER — Ambulatory Visit: Payer: Medicare Other | Admitting: Physical Therapy

## 2012-02-21 ENCOUNTER — Encounter: Payer: Medicare Other | Admitting: Physical Therapy

## 2012-02-21 ENCOUNTER — Ambulatory Visit: Payer: Medicare Other | Admitting: Physical Therapy

## 2012-02-25 ENCOUNTER — Ambulatory Visit: Payer: Medicare Other | Admitting: Physical Therapy

## 2012-02-27 ENCOUNTER — Ambulatory Visit: Payer: Medicare Other | Admitting: Physical Therapy

## 2012-03-03 ENCOUNTER — Encounter: Payer: Medicare Other | Admitting: Physical Therapy

## 2012-03-03 ENCOUNTER — Ambulatory Visit: Payer: Medicare Other | Attending: Vascular Surgery | Admitting: Physical Therapy

## 2012-03-03 DIAGNOSIS — IMO0001 Reserved for inherently not codable concepts without codable children: Secondary | ICD-10-CM | POA: Insufficient documentation

## 2012-03-03 DIAGNOSIS — R5381 Other malaise: Secondary | ICD-10-CM | POA: Insufficient documentation

## 2012-03-03 DIAGNOSIS — M6281 Muscle weakness (generalized): Secondary | ICD-10-CM | POA: Insufficient documentation

## 2012-03-03 DIAGNOSIS — S78119A Complete traumatic amputation at level between unspecified hip and knee, initial encounter: Secondary | ICD-10-CM | POA: Insufficient documentation

## 2012-03-03 DIAGNOSIS — M2560 Stiffness of unspecified joint, not elsewhere classified: Secondary | ICD-10-CM | POA: Insufficient documentation

## 2012-03-05 ENCOUNTER — Ambulatory Visit: Payer: Medicare Other | Admitting: Physical Therapy

## 2012-03-10 ENCOUNTER — Encounter: Payer: Self-pay | Admitting: Physical Therapy

## 2012-03-11 ENCOUNTER — Encounter: Payer: Medicare Other | Admitting: Physical Therapy

## 2012-03-12 ENCOUNTER — Ambulatory Visit: Payer: Medicare Other | Admitting: Physical Therapy

## 2012-03-13 ENCOUNTER — Ambulatory Visit: Payer: Medicare Other | Admitting: Physical Therapy

## 2012-03-17 ENCOUNTER — Ambulatory Visit: Payer: Medicare Other | Admitting: Physical Therapy

## 2012-03-20 ENCOUNTER — Encounter: Payer: Medicare Other | Admitting: Physical Therapy

## 2012-03-20 ENCOUNTER — Ambulatory Visit: Payer: Medicare Other | Admitting: Physical Therapy

## 2012-03-31 ENCOUNTER — Ambulatory Visit: Payer: Medicare Other | Admitting: Physical Therapy

## 2012-04-03 ENCOUNTER — Ambulatory Visit: Payer: Medicare Other | Admitting: Physical Therapy

## 2012-04-08 ENCOUNTER — Ambulatory Visit: Payer: Medicare Other | Attending: Vascular Surgery | Admitting: Physical Therapy

## 2012-04-08 DIAGNOSIS — IMO0001 Reserved for inherently not codable concepts without codable children: Secondary | ICD-10-CM | POA: Insufficient documentation

## 2012-04-08 DIAGNOSIS — M2560 Stiffness of unspecified joint, not elsewhere classified: Secondary | ICD-10-CM | POA: Insufficient documentation

## 2012-04-08 DIAGNOSIS — R5381 Other malaise: Secondary | ICD-10-CM | POA: Insufficient documentation

## 2012-04-08 DIAGNOSIS — S78119A Complete traumatic amputation at level between unspecified hip and knee, initial encounter: Secondary | ICD-10-CM | POA: Insufficient documentation

## 2012-04-08 DIAGNOSIS — M6281 Muscle weakness (generalized): Secondary | ICD-10-CM | POA: Insufficient documentation

## 2012-04-10 ENCOUNTER — Ambulatory Visit: Payer: Medicare Other | Admitting: Physical Therapy

## 2012-04-15 ENCOUNTER — Ambulatory Visit: Payer: Medicare Other | Admitting: Physical Therapy

## 2012-04-17 ENCOUNTER — Ambulatory Visit: Payer: Medicare Other | Admitting: Physical Therapy

## 2012-04-22 ENCOUNTER — Ambulatory Visit: Payer: Medicare Other | Admitting: Physical Therapy

## 2012-04-24 ENCOUNTER — Ambulatory Visit: Payer: Medicare Other | Admitting: Physical Therapy

## 2012-04-29 ENCOUNTER — Ambulatory Visit: Payer: Medicare Other | Admitting: Physical Therapy

## 2012-05-01 ENCOUNTER — Ambulatory Visit: Payer: Medicare Other | Admitting: Physical Therapy

## 2012-05-06 ENCOUNTER — Ambulatory Visit: Payer: Medicare Other | Attending: Vascular Surgery | Admitting: Physical Therapy

## 2012-05-06 DIAGNOSIS — IMO0001 Reserved for inherently not codable concepts without codable children: Secondary | ICD-10-CM | POA: Insufficient documentation

## 2012-05-06 DIAGNOSIS — R5381 Other malaise: Secondary | ICD-10-CM | POA: Insufficient documentation

## 2012-05-06 DIAGNOSIS — M2560 Stiffness of unspecified joint, not elsewhere classified: Secondary | ICD-10-CM | POA: Insufficient documentation

## 2012-05-06 DIAGNOSIS — M6281 Muscle weakness (generalized): Secondary | ICD-10-CM | POA: Insufficient documentation

## 2012-05-06 DIAGNOSIS — S78119A Complete traumatic amputation at level between unspecified hip and knee, initial encounter: Secondary | ICD-10-CM | POA: Insufficient documentation

## 2012-05-13 ENCOUNTER — Ambulatory Visit: Payer: Medicare Other | Admitting: Physical Therapy

## 2012-05-15 ENCOUNTER — Ambulatory Visit: Payer: Medicare Other | Admitting: Physical Therapy

## 2012-05-20 ENCOUNTER — Ambulatory Visit: Payer: Medicare Other | Admitting: Physical Therapy

## 2012-05-22 ENCOUNTER — Ambulatory Visit: Payer: Medicare Other | Admitting: Physical Therapy

## 2012-05-27 ENCOUNTER — Ambulatory Visit: Payer: Medicare Other | Admitting: Physical Therapy

## 2012-05-29 ENCOUNTER — Ambulatory Visit: Payer: Medicare Other | Admitting: Physical Therapy

## 2012-06-03 ENCOUNTER — Ambulatory Visit: Payer: Medicare Other | Admitting: Physical Therapy

## 2012-06-05 ENCOUNTER — Ambulatory Visit: Payer: Medicare Other | Attending: Vascular Surgery | Admitting: Physical Therapy

## 2012-06-05 DIAGNOSIS — R5381 Other malaise: Secondary | ICD-10-CM | POA: Insufficient documentation

## 2012-06-05 DIAGNOSIS — M2560 Stiffness of unspecified joint, not elsewhere classified: Secondary | ICD-10-CM | POA: Insufficient documentation

## 2012-06-05 DIAGNOSIS — IMO0001 Reserved for inherently not codable concepts without codable children: Secondary | ICD-10-CM | POA: Insufficient documentation

## 2012-06-05 DIAGNOSIS — S78119A Complete traumatic amputation at level between unspecified hip and knee, initial encounter: Secondary | ICD-10-CM | POA: Insufficient documentation

## 2012-06-05 DIAGNOSIS — M6281 Muscle weakness (generalized): Secondary | ICD-10-CM | POA: Insufficient documentation

## 2012-06-10 ENCOUNTER — Ambulatory Visit: Payer: Medicare Other | Admitting: Physical Therapy

## 2012-06-12 ENCOUNTER — Ambulatory Visit: Payer: Medicare Other | Admitting: Physical Therapy

## 2012-06-17 ENCOUNTER — Ambulatory Visit: Payer: Medicare Other | Admitting: Physical Therapy

## 2012-06-19 ENCOUNTER — Ambulatory Visit: Payer: Medicare Other | Admitting: Physical Therapy

## 2012-06-24 ENCOUNTER — Ambulatory Visit: Payer: Medicare Other | Admitting: Physical Therapy

## 2012-06-26 ENCOUNTER — Ambulatory Visit: Payer: Medicare Other | Admitting: Physical Therapy

## 2012-07-01 ENCOUNTER — Ambulatory Visit: Payer: Medicare Other | Attending: Vascular Surgery | Admitting: Physical Therapy

## 2012-07-01 DIAGNOSIS — R5381 Other malaise: Secondary | ICD-10-CM | POA: Insufficient documentation

## 2012-07-01 DIAGNOSIS — IMO0001 Reserved for inherently not codable concepts without codable children: Secondary | ICD-10-CM | POA: Insufficient documentation

## 2012-07-01 DIAGNOSIS — M6281 Muscle weakness (generalized): Secondary | ICD-10-CM | POA: Insufficient documentation

## 2012-07-01 DIAGNOSIS — M2560 Stiffness of unspecified joint, not elsewhere classified: Secondary | ICD-10-CM | POA: Insufficient documentation

## 2012-07-01 DIAGNOSIS — S78119A Complete traumatic amputation at level between unspecified hip and knee, initial encounter: Secondary | ICD-10-CM | POA: Insufficient documentation

## 2012-07-03 ENCOUNTER — Ambulatory Visit: Payer: Medicare Other | Admitting: Physical Therapy

## 2012-07-10 ENCOUNTER — Encounter: Payer: Medicare Other | Admitting: Physical Therapy

## 2012-07-15 ENCOUNTER — Ambulatory Visit: Payer: Medicare Other | Admitting: Physical Therapy

## 2012-07-17 ENCOUNTER — Ambulatory Visit: Payer: Medicare Other | Admitting: Physical Therapy

## 2012-07-22 ENCOUNTER — Ambulatory Visit: Payer: Medicare Other | Admitting: Physical Therapy

## 2012-07-24 ENCOUNTER — Ambulatory Visit: Payer: Medicare Other | Admitting: Physical Therapy

## 2012-07-29 ENCOUNTER — Ambulatory Visit: Payer: Medicare Other | Admitting: Physical Therapy

## 2012-07-31 ENCOUNTER — Ambulatory Visit: Payer: Medicare Other | Attending: Vascular Surgery | Admitting: Physical Therapy

## 2012-07-31 DIAGNOSIS — IMO0001 Reserved for inherently not codable concepts without codable children: Secondary | ICD-10-CM | POA: Insufficient documentation

## 2012-07-31 DIAGNOSIS — M6281 Muscle weakness (generalized): Secondary | ICD-10-CM | POA: Insufficient documentation

## 2012-07-31 DIAGNOSIS — M2560 Stiffness of unspecified joint, not elsewhere classified: Secondary | ICD-10-CM | POA: Insufficient documentation

## 2012-07-31 DIAGNOSIS — S78119A Complete traumatic amputation at level between unspecified hip and knee, initial encounter: Secondary | ICD-10-CM | POA: Insufficient documentation

## 2012-07-31 DIAGNOSIS — R5381 Other malaise: Secondary | ICD-10-CM | POA: Insufficient documentation

## 2012-08-05 ENCOUNTER — Ambulatory Visit: Payer: Medicare Other | Admitting: Physical Therapy

## 2012-08-07 ENCOUNTER — Ambulatory Visit: Payer: Medicare Other | Admitting: Physical Therapy

## 2012-08-12 ENCOUNTER — Encounter: Payer: Medicare Other | Admitting: Physical Therapy

## 2012-08-14 ENCOUNTER — Encounter: Payer: Medicare Other | Admitting: Physical Therapy

## 2013-03-09 ENCOUNTER — Ambulatory Visit (HOSPITAL_COMMUNITY)
Admission: RE | Admit: 2013-03-09 | Discharge: 2013-03-09 | Disposition: A | Discharge status: Home / Self Care | Payer: Medicare Other | Source: Ambulatory Visit | Attending: Vascular Surgery | Admitting: Vascular Surgery

## 2013-03-09 ENCOUNTER — Other Ambulatory Visit (HOSPITAL_COMMUNITY): Payer: Self-pay | Admitting: Internal Medicine

## 2013-03-09 DIAGNOSIS — I739 Peripheral vascular disease, unspecified: Secondary | ICD-10-CM | POA: Insufficient documentation

## 2013-03-23 ENCOUNTER — Other Ambulatory Visit: Payer: Self-pay | Admitting: Gastroenterology

## 2013-04-01 ENCOUNTER — Other Ambulatory Visit (HOSPITAL_COMMUNITY): Payer: Self-pay | Admitting: Internal Medicine

## 2013-04-01 ENCOUNTER — Ambulatory Visit (HOSPITAL_COMMUNITY): Payer: Medicare Other | Attending: Internal Medicine | Admitting: Cardiology

## 2013-04-01 ENCOUNTER — Encounter (HOSPITAL_COMMUNITY): Payer: Self-pay | Admitting: Internal Medicine

## 2013-04-01 DIAGNOSIS — I359 Nonrheumatic aortic valve disorder, unspecified: Secondary | ICD-10-CM | POA: Insufficient documentation

## 2013-04-01 DIAGNOSIS — E669 Obesity, unspecified: Secondary | ICD-10-CM | POA: Insufficient documentation

## 2013-04-01 DIAGNOSIS — I319 Disease of pericardium, unspecified: Secondary | ICD-10-CM | POA: Insufficient documentation

## 2013-04-01 DIAGNOSIS — E785 Hyperlipidemia, unspecified: Secondary | ICD-10-CM | POA: Insufficient documentation

## 2013-04-01 DIAGNOSIS — I519 Heart disease, unspecified: Secondary | ICD-10-CM

## 2013-04-01 DIAGNOSIS — I10 Essential (primary) hypertension: Secondary | ICD-10-CM | POA: Insufficient documentation

## 2013-04-01 DIAGNOSIS — F17200 Nicotine dependence, unspecified, uncomplicated: Secondary | ICD-10-CM | POA: Insufficient documentation

## 2013-04-01 NOTE — Progress Notes (Signed)
Echo performed. 

## 2013-04-06 ENCOUNTER — Encounter: Payer: Self-pay | Admitting: Podiatry

## 2013-04-06 ENCOUNTER — Ambulatory Visit (INDEPENDENT_AMBULATORY_CARE_PROVIDER_SITE_OTHER): Payer: Medicare Other | Admitting: Podiatry

## 2013-04-06 DIAGNOSIS — B351 Tinea unguium: Secondary | ICD-10-CM

## 2013-04-06 DIAGNOSIS — M79609 Pain in unspecified limb: Secondary | ICD-10-CM

## 2013-04-06 DIAGNOSIS — I798 Other disorders of arteries, arterioles and capillaries in diseases classified elsewhere: Secondary | ICD-10-CM

## 2013-04-06 NOTE — Progress Notes (Signed)
° °  Subjective:    Patient ID: Stephanie Griffith, female    DOB: May 14, 1945, 68 y.o.   MRN: [id number]  HPI This nail is pressing against the tennis shoe and hurts and she cant walk and no burning and no throbbing and no numbness or tingling and had surgery on the left leg due to no circulation    Review of Systems  Constitutional: Negative.   HENT: Negative.   Eyes: Negative.   Respiratory: Negative.   Cardiovascular: Negative.   Gastrointestinal: Negative.   Endocrine: Negative.   Genitourinary: Negative.   Musculoskeletal: Negative.   Skin: Negative.   Allergic/Immunologic: Negative.   Neurological: Negative.   Hematological: Negative.   Psychiatric/Behavioral: Negative.        Objective:   Physical Exam        Assessment & Plan:

## 2013-04-07 NOTE — Progress Notes (Signed)
Subjective:     Patient ID: Stephanie Griffith, female   DOB: 01-19-1946, 68 y.o.   MRN: [id number]  HPI patient presents with caregiver in wheelchair after left lower leg amputation do to vascular disease with severe elongation of nailbeds 1-5 right foot   Review of Systems  All other systems reviewed and are negative.       Objective:   Physical Exam  Nursing note and vitals reviewed. Constitutional: She is oriented to person, place, and time.  Neurological: She is oriented to person, place, and time.  Skin: Skin is dry.   neurovascular status is diminished right that she does get this checked by a vascular doctor on a regular basis. 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 Debridement of nailbeds 1-5 right with no iatrogenic bleeding was accomplished and reappoint when nails elongated

## 2013-04-21 ENCOUNTER — Encounter (HOSPITAL_COMMUNITY): Payer: Self-pay | Admitting: Pharmacy Technician

## 2013-05-04 ENCOUNTER — Encounter (HOSPITAL_COMMUNITY): Payer: Self-pay | Admitting: *Deleted

## 2013-05-12 ENCOUNTER — Ambulatory Visit (HOSPITAL_COMMUNITY): Admission: RE | Admit: 2013-05-12 | Payer: Medicare Other | Source: Ambulatory Visit | Admitting: Gastroenterology

## 2013-05-12 SURGERY — COLONOSCOPY WITH PROPOFOL
Anesthesia: Monitor Anesthesia Care

## 2014-03-11 ENCOUNTER — Encounter (HOSPITAL_COMMUNITY): Payer: Self-pay | Admitting: Vascular Surgery

## 2014-05-21 ENCOUNTER — Encounter: Payer: Self-pay | Admitting: Vascular Surgery

## 2014-05-21 ENCOUNTER — Other Ambulatory Visit: Payer: Self-pay | Admitting: *Deleted

## 2014-05-21 DIAGNOSIS — I739 Peripheral vascular disease, unspecified: Secondary | ICD-10-CM

## 2014-07-14 ENCOUNTER — Encounter: Payer: Self-pay | Admitting: Vascular Surgery

## 2014-07-15 ENCOUNTER — Other Ambulatory Visit: Payer: Self-pay | Admitting: Vascular Surgery

## 2014-07-15 ENCOUNTER — Ambulatory Visit (HOSPITAL_COMMUNITY)
Admission: RE | Admit: 2014-07-15 | Discharge: 2014-07-15 | Disposition: A | Discharge status: Home / Self Care | Payer: Medicare Other | Source: Ambulatory Visit | Attending: Vascular Surgery | Admitting: Vascular Surgery

## 2014-07-15 ENCOUNTER — Encounter: Payer: Self-pay | Admitting: Vascular Surgery

## 2014-07-15 ENCOUNTER — Ambulatory Visit (INDEPENDENT_AMBULATORY_CARE_PROVIDER_SITE_OTHER): Payer: Medicare Other | Admitting: Vascular Surgery

## 2014-07-15 VITALS — BP 177/77 | HR 81 | Ht 68.0 in | Wt 226.0 lb

## 2014-07-15 DIAGNOSIS — I739 Peripheral vascular disease, unspecified: Secondary | ICD-10-CM

## 2014-07-15 DIAGNOSIS — M7989 Other specified soft tissue disorders: Secondary | ICD-10-CM

## 2014-07-15 NOTE — Progress Notes (Signed)
HISTORY AND PHYSICAL     CC:  Swelling of right leg Referring Provider:  [name], MD  HPI: This is a 69 y.o. female who is s/p left AKA 06/12/11 by Dr. Oneida Alar.  She states that she has been doing well from that standpoint.  She states that she does have a prosthesis, but has not been using it since it rubbed a red spot on her leg.  Her main complaint today is right leg swelling.  She states that it swells during the day and it is improved the next morning when she wakes.  She states that she spends most of her day in a wheel chair sitting without her leg elevated.  She denies any cuts or sores on her right foot.  She is no longer smoking since her amputation.  She is on a statin for her cholesterol.  She is on a beta blocker, CCB, and ARB for her hypertension and is also on a daily baby aspirin.     Past Medical History  Diagnosis Date  . Hypertension   . Hernia   . Chronic renal insufficiency     Cr 1.38-1.93 in 2009  . Umbilical hernia   . Hypercholesteremia   . PVD (peripheral vascular disease)     s/p LAKA    Past Surgical History  Procedure Laterality Date  . Tubal ligation    . Femoral-popliteal bypass graft  06/06/2011    Procedure: BYPASS GRAFT FEMORAL-POPLITEAL ARTERY;  Surgeon: Elam Dutch, MD;  Location: [facility] OR;  Service: Vascular;  Laterality: Left;  left femoral to popliteal bypass with composite vein and propaten 17mm x 80 graft  . Amputation  06/06/2011    Procedure: AMPUTATION DIGIT;  Surgeon: Elam Dutch, MD;  Location: [facility] OR;  Service: Vascular;  Laterality: Left;  Transmetatarsal amputation left great toe  . Amputation  06/12/2011    Procedure: AMPUTATION ABOVE KNEE;  Surgeon: Elam Dutch, MD;  Location: [place];  Service: Vascular;  Laterality: Left;  . Lower extremity angiogram N/A 06/04/2011    Procedure: LOWER EXTREMITY ANGIOGRAM;  Surgeon: Angelia Mould, MD;  Location: [facility] CATH LAB;  Service: Cardiovascular;  Laterality: N/A;    No  Known Allergies  Current Outpatient Prescriptions  Medication Sig Dispense Refill  . amLODipine (NORVASC) 5 MG tablet Take 5 mg by mouth daily.   2  . aspirin 81 MG EC tablet Take 81 mg by mouth daily.      Marland Kitchen atorvastatin (LIPITOR) 40 MG tablet Take 40 mg by mouth daily.    . carvedilol (COREG) 25 MG tablet Take 25 mg by mouth 2 (two) times daily with a meal.    . famotidine (PEPCID) 20 MG tablet Take 20 mg by mouth 2 (two) times daily.    Marland Kitchen gabapentin (NEURONTIN) 100 MG capsule Take 100 mg by mouth 2 (two) times daily.   5  . Multiple Vitamin (MULTIVITAMIN) capsule Take 1 capsule by mouth daily.    Marland Kitchen olmesartan-hydrochlorothiazide (BENICAR HCT) 40-25 MG per tablet Take 1 tablet by mouth daily.    Marland Kitchen oxyCODONE (OXY IR/ROXICODONE) 5 MG immediate release tablet Take 5 mg by mouth as needed.     No current facility-administered medications for this visit.    Family History  Problem Relation Age of Onset  . Heart attack Father   . Heart disease Father   . Heart attack Brother   . Diabetes Mother   . Hypertension Mother     History  Social History  . Marital Status: Married    Spouse Name: N/A  . Number of Children: N/A  . Years of Education: N/A   Occupational History  . Not on file.   Social History Main Topics  . Smoking status: Former Smoker -- 2.00 packs/day for 50 years    Quit date: 06/01/2011  . Smokeless tobacco: Never Used  . Alcohol Use: No  . Drug Use: No  . Sexual Activity: Not Currently   Other Topics Concern  . Not on file   Social History Narrative     ROS: [x]  Positive   [ ]  Negative   [ ]  All sytems reviewed and are negative  Cardiovascular: []  chest pain/pressure []  palpitations []  SOB lying flat []  DOE []  pain in legs while walking []  pain in feet when lying flat []  hx of DVT []  hx of phlebitis [x]  swelling in legs []  varicose veins  Pulmonary: []  productive cough []  asthma []  wheezing  Neurologic: []  weakness in []  arms []   legs []  numbness in []  arms []  legs [] difficulty speaking or slurred speech []  temporary loss of vision in one eye []  dizziness  Hematologic: []  bleeding problems []  problems with blood clotting easily  GI []  vomiting blood []  blood in stool  GU: []  burning with urination []  blood in urine  Psychiatric: []  hx of major depression  Integumentary: []  rashes []  ulcers  Constitutional: []  fever []  chills   PHYSICAL EXAMINATION:  Filed Vitals:   07/15/14 1547  BP: 177/77  Pulse:    Body mass index is 34.37 kg/(m^2).  General:  WDWN in NAD Gait: Not observed-wheelchair HENT: WNL, normocephalic Pulmonary: normal non-labored breathing  Abdomen: + ventral hernia Skin: without rashes, without ulcers  Vascular Exam/Pulses: + edema RLE; there are no ulcers or non healing wounds Extremities: without ischemic changes, without Gangrene , without cellulitis; without open wounds; + edema RLE Musculoskeletal: no muscle wasting or atrophy  Neurologic: A&O X 3; Appropriate Affect ; SENSATION: normal; MOTOR FUNCTION:  moving all extremities equally. Speech is fluent/normal   Non-Invasive Vascular Imaging:   ABI's: Right:  1.11 Left:  N/a-hx left AKA  Pt meds includes: Statin:  Yes.   Beta Blocker:  Yes.   Aspirin:  Yes.   ACEI:  No. ARB:  Yes.   Other Antiplatelet/Anticoagulant:  No.    ASSESSMENT/PLAN:: 69 y.o. female with hx of PAD and left AKA in March 2013.  She presents today with swelling of RLE   -the pt spends most of her day sitting in a wheelchair with her leg hanging down.  She is advised to elevate her right leg.   She is given an Rx for compression stocking and information for Elastic Therapy -she will return to see [place] on an as needed basis.   Leontine Locket, PA-C [facility] [phone]  Clinic MD:  Pt seen and examined in conjunction with Dr. Oneida Alar   History and exam findings as above. Patient has normal ABIs on her right  leg. Most likely this is dependent edema. She was given a prescription for compression stockings today and told to elevate her leg when she can and try to stay out of the wheelchair. She is trying to get back into her left above-knee amputation prosthetic which will probably help if she is more ambulatory. Follow-up on as-needed basis if she develops wounds or ulcerations on her foot.  [name], MD [facility] [place] Office: [phone] Pager: [phone]

## 2015-12-29 ENCOUNTER — Encounter (HOSPITAL_COMMUNITY): Payer: Self-pay | Admitting: *Deleted

## 2015-12-29 DIAGNOSIS — L8990 Pressure ulcer of unspecified site, unspecified stage: Secondary | ICD-10-CM | POA: Diagnosis present

## 2015-12-29 DIAGNOSIS — R739 Hyperglycemia, unspecified: Secondary | ICD-10-CM | POA: Diagnosis present

## 2015-12-29 DIAGNOSIS — D62 Acute posthemorrhagic anemia: Secondary | ICD-10-CM | POA: Diagnosis not present

## 2015-12-29 DIAGNOSIS — N179 Acute kidney failure, unspecified: Secondary | ICD-10-CM | POA: Diagnosis present

## 2015-12-29 DIAGNOSIS — J449 Chronic obstructive pulmonary disease, unspecified: Secondary | ICD-10-CM | POA: Diagnosis present

## 2015-12-29 DIAGNOSIS — E669 Obesity, unspecified: Secondary | ICD-10-CM | POA: Diagnosis present

## 2015-12-29 DIAGNOSIS — I952 Hypotension due to drugs: Secondary | ICD-10-CM | POA: Diagnosis not present

## 2015-12-29 DIAGNOSIS — Z993 Dependence on wheelchair: Secondary | ICD-10-CM

## 2015-12-29 DIAGNOSIS — F09 Unspecified mental disorder due to known physiological condition: Secondary | ICD-10-CM | POA: Diagnosis not present

## 2015-12-29 DIAGNOSIS — Z89612 Acquired absence of left leg above knee: Secondary | ICD-10-CM

## 2015-12-29 DIAGNOSIS — I2510 Atherosclerotic heart disease of native coronary artery without angina pectoris: Secondary | ICD-10-CM | POA: Diagnosis present

## 2015-12-29 DIAGNOSIS — R112 Nausea with vomiting, unspecified: Secondary | ICD-10-CM | POA: Diagnosis not present

## 2015-12-29 DIAGNOSIS — E872 Acidosis: Secondary | ICD-10-CM | POA: Diagnosis present

## 2015-12-29 DIAGNOSIS — I96 Gangrene, not elsewhere classified: Secondary | ICD-10-CM | POA: Diagnosis not present

## 2015-12-29 DIAGNOSIS — Z87891 Personal history of nicotine dependence: Secondary | ICD-10-CM

## 2015-12-29 DIAGNOSIS — Z833 Family history of diabetes mellitus: Secondary | ICD-10-CM

## 2015-12-29 DIAGNOSIS — I471 Supraventricular tachycardia: Secondary | ICD-10-CM | POA: Diagnosis present

## 2015-12-29 DIAGNOSIS — I248 Other forms of acute ischemic heart disease: Secondary | ICD-10-CM | POA: Diagnosis not present

## 2015-12-29 DIAGNOSIS — J95821 Acute postprocedural respiratory failure: Secondary | ICD-10-CM | POA: Diagnosis not present

## 2015-12-29 DIAGNOSIS — I742 Embolism and thrombosis of arteries of the upper extremities: Secondary | ICD-10-CM | POA: Diagnosis not present

## 2015-12-29 DIAGNOSIS — D638 Anemia in other chronic diseases classified elsewhere: Secondary | ICD-10-CM | POA: Diagnosis present

## 2015-12-29 DIAGNOSIS — I480 Paroxysmal atrial fibrillation: Secondary | ICD-10-CM | POA: Diagnosis present

## 2015-12-29 DIAGNOSIS — K436 Other and unspecified ventral hernia with obstruction, without gangrene: Secondary | ICD-10-CM | POA: Diagnosis not present

## 2015-12-29 DIAGNOSIS — I129 Hypertensive chronic kidney disease with stage 1 through stage 4 chronic kidney disease, or unspecified chronic kidney disease: Secondary | ICD-10-CM | POA: Diagnosis present

## 2015-12-29 DIAGNOSIS — Z6835 Body mass index (BMI) 35.0-35.9, adult: Secondary | ICD-10-CM

## 2015-12-29 DIAGNOSIS — I252 Old myocardial infarction: Secondary | ICD-10-CM

## 2015-12-29 DIAGNOSIS — R230 Cyanosis: Secondary | ICD-10-CM | POA: Diagnosis not present

## 2015-12-29 DIAGNOSIS — T38895A Adverse effect of other hormones and synthetic substitutes, initial encounter: Secondary | ICD-10-CM | POA: Diagnosis not present

## 2015-12-29 DIAGNOSIS — T402X5A Adverse effect of other opioids, initial encounter: Secondary | ICD-10-CM | POA: Diagnosis not present

## 2015-12-29 DIAGNOSIS — Z8673 Personal history of transient ischemic attack (TIA), and cerebral infarction without residual deficits: Secondary | ICD-10-CM

## 2015-12-29 DIAGNOSIS — J810 Acute pulmonary edema: Secondary | ICD-10-CM | POA: Diagnosis not present

## 2015-12-29 DIAGNOSIS — Z8249 Family history of ischemic heart disease and other diseases of the circulatory system: Secondary | ICD-10-CM

## 2015-12-29 DIAGNOSIS — E875 Hyperkalemia: Secondary | ICD-10-CM | POA: Diagnosis present

## 2015-12-29 DIAGNOSIS — T4275XA Adverse effect of unspecified antiepileptic and sedative-hypnotic drugs, initial encounter: Secondary | ICD-10-CM | POA: Diagnosis not present

## 2015-12-29 DIAGNOSIS — Z79899 Other long term (current) drug therapy: Secondary | ICD-10-CM

## 2015-12-29 DIAGNOSIS — Z7982 Long term (current) use of aspirin: Secondary | ICD-10-CM

## 2015-12-29 DIAGNOSIS — Z23 Encounter for immunization: Secondary | ICD-10-CM

## 2015-12-29 DIAGNOSIS — N183 Chronic kidney disease, stage 3 (moderate): Secondary | ICD-10-CM | POA: Diagnosis present

## 2015-12-29 DIAGNOSIS — E7800 Pure hypercholesterolemia, unspecified: Secondary | ICD-10-CM | POA: Diagnosis present

## 2015-12-29 LAB — CBC WITH DIFFERENTIAL/PLATELET
Basophils Absolute: 0 10*3/uL (ref 0.0–0.1)
Basophils Relative: 0 %
Eosinophils Absolute: 0 10*3/uL (ref 0.0–0.7)
Eosinophils Relative: 0 %
HCT: 36.9 % (ref 36.0–46.0)
Hemoglobin: 11.4 g/dL — ABNORMAL LOW (ref 12.0–15.0)
Lymphocytes Relative: 7 %
Lymphs Abs: 1 10*3/uL (ref 0.7–4.0)
MCH: 29.1 pg (ref 26.0–34.0)
MCHC: 30.9 g/dL (ref 30.0–36.0)
MCV: 94.1 fL (ref 78.0–100.0)
Monocytes Absolute: 0.5 10*3/uL (ref 0.1–1.0)
Monocytes Relative: 3 %
Neutro Abs: 12.3 10*3/uL — ABNORMAL HIGH (ref 1.7–7.7)
Neutrophils Relative %: 90 %
Platelets: 325 10*3/uL (ref 150–400)
RBC: 3.92 MIL/uL (ref 3.87–5.11)
RDW: 13.8 % (ref 11.5–15.5)
WBC: 13.7 10*3/uL — ABNORMAL HIGH (ref 4.0–10.5)

## 2015-12-29 LAB — COMPREHENSIVE METABOLIC PANEL
ALT: 15 U/L (ref 14–54)
AST: 23 U/L (ref 15–41)
Albumin: 4 g/dL (ref 3.5–5.0)
Alkaline Phosphatase: 178 U/L — ABNORMAL HIGH (ref 38–126)
Anion gap: 7 (ref 5–15)
BUN: 38 mg/dL — ABNORMAL HIGH (ref 6–20)
CO2: 17 mmol/L — ABNORMAL LOW (ref 22–32)
Calcium: 9.1 mg/dL (ref 8.9–10.3)
Chloride: 110 mmol/L (ref 101–111)
Creatinine, Ser: 1.88 mg/dL — ABNORMAL HIGH (ref 0.44–1.00)
GFR calc Af Amer: 30 mL/min — ABNORMAL LOW (ref >60)
GFR calc non Af Amer: 26 mL/min — ABNORMAL LOW (ref >60)
Glucose, Bld: 159 mg/dL — ABNORMAL HIGH (ref 65–99)
Potassium: 6.4 mmol/L (ref 3.5–5.1)
Sodium: 134 mmol/L — ABNORMAL LOW (ref 135–145)
Total Bilirubin: 0.4 mg/dL (ref 0.3–1.2)
Total Protein: 8.8 g/dL — ABNORMAL HIGH (ref 6.5–8.1)

## 2015-12-29 LAB — LIPASE, BLOOD: Lipase: 47 U/L (ref 11–51)

## 2015-12-29 LAB — URINALYSIS, ROUTINE W REFLEX MICROSCOPIC
Bilirubin Urine: NEGATIVE
Glucose, UA: NEGATIVE mg/dL
Ketones, ur: NEGATIVE mg/dL
Nitrite: NEGATIVE
Protein, ur: 30 mg/dL — AB
Specific Gravity, Urine: 1.02 (ref 1.005–1.030)
pH: 6.5 (ref 5.0–8.0)

## 2015-12-29 LAB — URINE MICROSCOPIC-ADD ON

## 2015-12-29 NOTE — ED Notes (Signed)
Potassium 6.4  Dr Viviana Simpler aware, nurse first aware

## 2015-12-29 NOTE — ED Triage Notes (Signed)
Patient presents with c/o abd pain that started around 3pm.  States she has a hernia but thinks it may be something else.  +N/V

## 2015-12-30 ENCOUNTER — Encounter (HOSPITAL_COMMUNITY): Payer: Self-pay | Admitting: Internal Medicine

## 2015-12-30 ENCOUNTER — Inpatient Hospital Stay (HOSPITAL_COMMUNITY)
Admission: EM | Admit: 2015-12-30 | Discharge: 2016-01-10 | DRG: 353 | Disposition: A | Discharge status: Home Health | Payer: Medicare Other | Attending: Internal Medicine | Admitting: Internal Medicine

## 2015-12-30 ENCOUNTER — Emergency Department (HOSPITAL_COMMUNITY): Payer: Medicare Other

## 2015-12-30 ENCOUNTER — Inpatient Hospital Stay (HOSPITAL_COMMUNITY): Payer: Medicare Other

## 2015-12-30 DIAGNOSIS — R109 Unspecified abdominal pain: Secondary | ICD-10-CM | POA: Diagnosis present

## 2015-12-30 DIAGNOSIS — L8990 Pressure ulcer of unspecified site, unspecified stage: Secondary | ICD-10-CM | POA: Insufficient documentation

## 2015-12-30 DIAGNOSIS — R0602 Shortness of breath: Secondary | ICD-10-CM

## 2015-12-30 DIAGNOSIS — I96 Gangrene, not elsewhere classified: Secondary | ICD-10-CM | POA: Diagnosis not present

## 2015-12-30 DIAGNOSIS — Z9289 Personal history of other medical treatment: Secondary | ICD-10-CM

## 2015-12-30 DIAGNOSIS — Z7982 Long term (current) use of aspirin: Secondary | ICD-10-CM | POA: Diagnosis not present

## 2015-12-30 DIAGNOSIS — Z833 Family history of diabetes mellitus: Secondary | ICD-10-CM | POA: Diagnosis not present

## 2015-12-30 DIAGNOSIS — R739 Hyperglycemia, unspecified: Secondary | ICD-10-CM | POA: Diagnosis present

## 2015-12-30 DIAGNOSIS — N189 Chronic kidney disease, unspecified: Secondary | ICD-10-CM

## 2015-12-30 DIAGNOSIS — K460 Unspecified abdominal hernia with obstruction, without gangrene: Secondary | ICD-10-CM

## 2015-12-30 DIAGNOSIS — I248 Other forms of acute ischemic heart disease: Secondary | ICD-10-CM | POA: Diagnosis not present

## 2015-12-30 DIAGNOSIS — I739 Peripheral vascular disease, unspecified: Secondary | ICD-10-CM | POA: Diagnosis present

## 2015-12-30 DIAGNOSIS — N179 Acute kidney failure, unspecified: Secondary | ICD-10-CM | POA: Diagnosis present

## 2015-12-30 DIAGNOSIS — E875 Hyperkalemia: Secondary | ICD-10-CM | POA: Diagnosis not present

## 2015-12-30 DIAGNOSIS — I10 Essential (primary) hypertension: Secondary | ICD-10-CM | POA: Diagnosis not present

## 2015-12-30 DIAGNOSIS — E872 Acidosis: Secondary | ICD-10-CM | POA: Diagnosis present

## 2015-12-30 DIAGNOSIS — J810 Acute pulmonary edema: Secondary | ICD-10-CM | POA: Diagnosis not present

## 2015-12-30 DIAGNOSIS — I471 Supraventricular tachycardia: Secondary | ICD-10-CM | POA: Diagnosis present

## 2015-12-30 DIAGNOSIS — I4891 Unspecified atrial fibrillation: Secondary | ICD-10-CM | POA: Diagnosis not present

## 2015-12-30 DIAGNOSIS — K436 Other and unspecified ventral hernia with obstruction, without gangrene: Secondary | ICD-10-CM | POA: Diagnosis present

## 2015-12-30 DIAGNOSIS — D72829 Elevated white blood cell count, unspecified: Secondary | ICD-10-CM

## 2015-12-30 DIAGNOSIS — N185 Chronic kidney disease, stage 5: Secondary | ICD-10-CM | POA: Diagnosis present

## 2015-12-30 DIAGNOSIS — J9601 Acute respiratory failure with hypoxia: Secondary | ICD-10-CM

## 2015-12-30 DIAGNOSIS — E7800 Pure hypercholesterolemia, unspecified: Secondary | ICD-10-CM | POA: Diagnosis present

## 2015-12-30 DIAGNOSIS — J9600 Acute respiratory failure, unspecified whether with hypoxia or hypercapnia: Secondary | ICD-10-CM

## 2015-12-30 DIAGNOSIS — M6281 Muscle weakness (generalized): Secondary | ICD-10-CM

## 2015-12-30 DIAGNOSIS — J95821 Acute postprocedural respiratory failure: Secondary | ICD-10-CM | POA: Diagnosis not present

## 2015-12-30 DIAGNOSIS — Z993 Dependence on wheelchair: Secondary | ICD-10-CM | POA: Diagnosis not present

## 2015-12-30 DIAGNOSIS — N183 Chronic kidney disease, stage 3 (moderate): Secondary | ICD-10-CM

## 2015-12-30 DIAGNOSIS — R0600 Dyspnea, unspecified: Secondary | ICD-10-CM | POA: Diagnosis not present

## 2015-12-30 DIAGNOSIS — Z87891 Personal history of nicotine dependence: Secondary | ICD-10-CM | POA: Diagnosis not present

## 2015-12-30 DIAGNOSIS — M79642 Pain in left hand: Secondary | ICD-10-CM | POA: Diagnosis not present

## 2015-12-30 DIAGNOSIS — Z8249 Family history of ischemic heart disease and other diseases of the circulatory system: Secondary | ICD-10-CM | POA: Diagnosis not present

## 2015-12-30 DIAGNOSIS — K439 Ventral hernia without obstruction or gangrene: Secondary | ICD-10-CM | POA: Diagnosis present

## 2015-12-30 DIAGNOSIS — I480 Paroxysmal atrial fibrillation: Secondary | ICD-10-CM | POA: Diagnosis present

## 2015-12-30 DIAGNOSIS — K56609 Unspecified intestinal obstruction, unspecified as to partial versus complete obstruction: Secondary | ICD-10-CM

## 2015-12-30 DIAGNOSIS — Z978 Presence of other specified devices: Secondary | ICD-10-CM

## 2015-12-30 DIAGNOSIS — I252 Old myocardial infarction: Secondary | ICD-10-CM | POA: Diagnosis not present

## 2015-12-30 DIAGNOSIS — J9690 Respiratory failure, unspecified, unspecified whether with hypoxia or hypercapnia: Secondary | ICD-10-CM

## 2015-12-30 DIAGNOSIS — I998 Other disorder of circulatory system: Secondary | ICD-10-CM | POA: Diagnosis not present

## 2015-12-30 DIAGNOSIS — R4182 Altered mental status, unspecified: Secondary | ICD-10-CM

## 2015-12-30 DIAGNOSIS — Z89612 Acquired absence of left leg above knee: Secondary | ICD-10-CM | POA: Diagnosis not present

## 2015-12-30 DIAGNOSIS — J449 Chronic obstructive pulmonary disease, unspecified: Secondary | ICD-10-CM | POA: Diagnosis present

## 2015-12-30 DIAGNOSIS — D62 Acute posthemorrhagic anemia: Secondary | ICD-10-CM | POA: Diagnosis not present

## 2015-12-30 DIAGNOSIS — Z79899 Other long term (current) drug therapy: Secondary | ICD-10-CM | POA: Diagnosis not present

## 2015-12-30 DIAGNOSIS — R112 Nausea with vomiting, unspecified: Secondary | ICD-10-CM | POA: Diagnosis present

## 2015-12-30 DIAGNOSIS — I742 Embolism and thrombosis of arteries of the upper extremities: Secondary | ICD-10-CM | POA: Diagnosis not present

## 2015-12-30 HISTORY — DX: Hyperglycemia, unspecified: R73.9

## 2015-12-30 LAB — BASIC METABOLIC PANEL
Anion gap: 5 (ref 5–15)
Anion gap: 10 (ref 5–15)
Anion gap: 5 (ref 5–15)
BUN: 35 mg/dL — ABNORMAL HIGH (ref 6–20)
BUN: 33 mg/dL — ABNORMAL HIGH (ref 6–20)
BUN: 32 mg/dL — ABNORMAL HIGH (ref 6–20)
CO2: 17 mmol/L — ABNORMAL LOW (ref 22–32)
CO2: 15 mmol/L — ABNORMAL LOW (ref 22–32)
CO2: 17 mmol/L — ABNORMAL LOW (ref 22–32)
Calcium: 8.6 mg/dL — ABNORMAL LOW (ref 8.9–10.3)
Calcium: 8.6 mg/dL — ABNORMAL LOW (ref 8.9–10.3)
Calcium: 8.6 mg/dL — ABNORMAL LOW (ref 8.9–10.3)
Chloride: 113 mmol/L — ABNORMAL HIGH (ref 101–111)
Chloride: 114 mmol/L — ABNORMAL HIGH (ref 101–111)
Chloride: 115 mmol/L — ABNORMAL HIGH (ref 101–111)
Creatinine, Ser: 1.76 mg/dL — ABNORMAL HIGH (ref 0.44–1.00)
Creatinine, Ser: 1.92 mg/dL — ABNORMAL HIGH (ref 0.44–1.00)
Creatinine, Ser: 1.92 mg/dL — ABNORMAL HIGH (ref 0.44–1.00)
GFR calc Af Amer: 33 mL/min — ABNORMAL LOW (ref >60)
GFR calc Af Amer: 29 mL/min — ABNORMAL LOW (ref >60)
GFR calc Af Amer: 29 mL/min — ABNORMAL LOW (ref >60)
GFR calc non Af Amer: 28 mL/min — ABNORMAL LOW (ref >60)
GFR calc non Af Amer: 25 mL/min — ABNORMAL LOW (ref >60)
GFR calc non Af Amer: 25 mL/min — ABNORMAL LOW (ref >60)
Glucose, Bld: 132 mg/dL — ABNORMAL HIGH (ref 65–99)
Glucose, Bld: 116 mg/dL — ABNORMAL HIGH (ref 65–99)
Glucose, Bld: 115 mg/dL — ABNORMAL HIGH (ref 65–99)
Potassium: 6.3 mmol/L (ref 3.5–5.1)
Potassium: 5.5 mmol/L — ABNORMAL HIGH (ref 3.5–5.1)
Potassium: 5.8 mmol/L — ABNORMAL HIGH (ref 3.5–5.1)
Sodium: 135 mmol/L (ref 135–145)
Sodium: 139 mmol/L (ref 135–145)
Sodium: 137 mmol/L (ref 135–145)

## 2015-12-30 LAB — MRSA PCR SCREENING: MRSA by PCR: NEGATIVE

## 2015-12-30 LAB — POTASSIUM: Potassium: 6.6 mmol/L (ref 3.5–5.1)

## 2015-12-30 LAB — LACTIC ACID, PLASMA: Lactic Acid, Venous: 2.5 mmol/L (ref 0.5–1.9)

## 2015-12-30 LAB — I-STAT CG4 LACTIC ACID, ED: Lactic Acid, Venous: 1.92 mmol/L (ref 0.5–1.9)

## 2015-12-30 LAB — CBG MONITORING, ED
Glucose-Capillary: 90 mg/dL (ref 65–99)
Glucose-Capillary: 117 mg/dL — ABNORMAL HIGH (ref 65–99)

## 2015-12-30 LAB — GLUCOSE, CAPILLARY: Glucose-Capillary: 97 mg/dL (ref 65–99)

## 2015-12-30 LAB — PROTIME-INR
INR: 1.06
Prothrombin Time: 13.9 seconds (ref 11.4–15.2)

## 2015-12-30 IMAGING — CR DG CHEST 1V PORT
1 series · 1 of 1 positions shown · non-contrast
Comparison: One-view chest x-ray [DATE]

CLINICAL DATA: Shortness of breath.  Abdominal pain.

EXAM:
PORTABLE CHEST 1 VIEW

[AP]
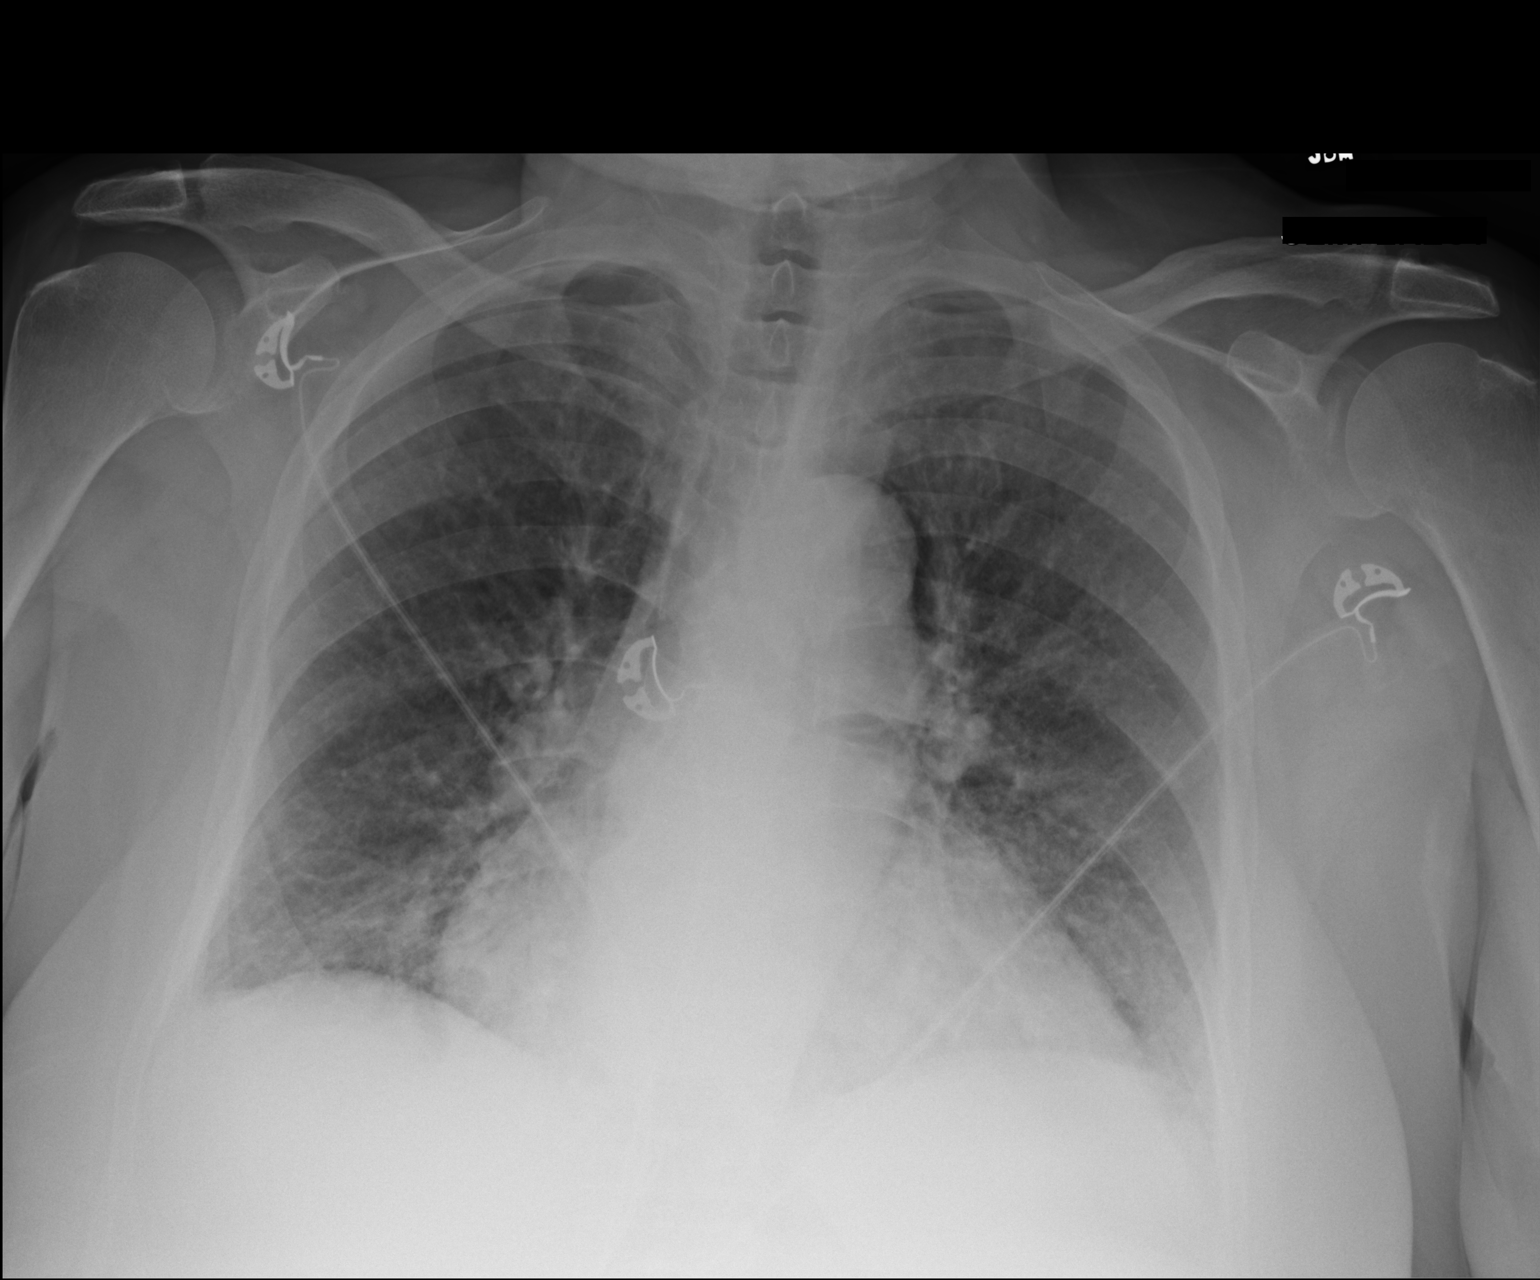

[1 of 1 positions shown; findings below may reference images not displayed]

FINDINGS: The heart is mildly enlarged. Mild edema is present. Small effusions
are suspected. Bibasilar airspace disease likely reflects
atelectasis. Lung apices are clear. The visualized soft tissues and
bony thorax are unremarkable.
IMPRESSION: 1. Cardiomegaly with mild edema and small effusions suggesting
congestive heart failure.
2. Mild the bibasilar airspace disease likely reflects atelectasis.

## 2015-12-30 IMAGING — CT CT ABD-PELV W/O CM
2 of 4 series · 8 of 46 positions shown, 9 images · non-contrast
Comparison: Abdominal CT dated [DATE] and ultrasound dated
[DATE]

CLINICAL DATA: 70-year-old female with abdominal pain. Ventral
hernia.

EXAM:
CT ABDOMEN AND PELVIS WITHOUT CONTRAST
TECHNIQUE: Multidetector CT imaging of the abdomen and pelvis was performed
following the standard protocol without IV contrast.

[Series 201: routine, idose (2) · axial · 0.92mm/px · z∈[+32,+397]mm · 5 of 99 slices shown, 6 images]
[im 13/99  soft-tissue]
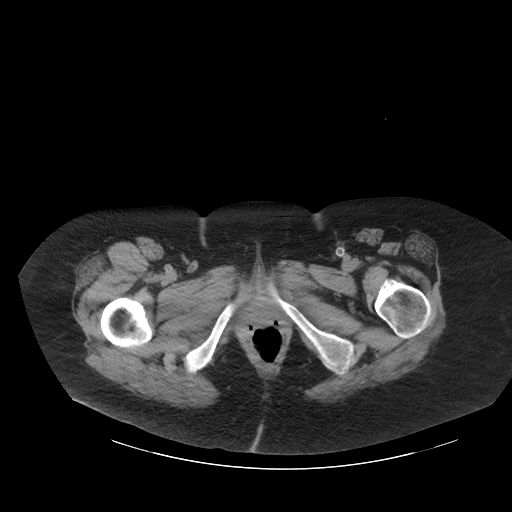
[im 13/99  bone]
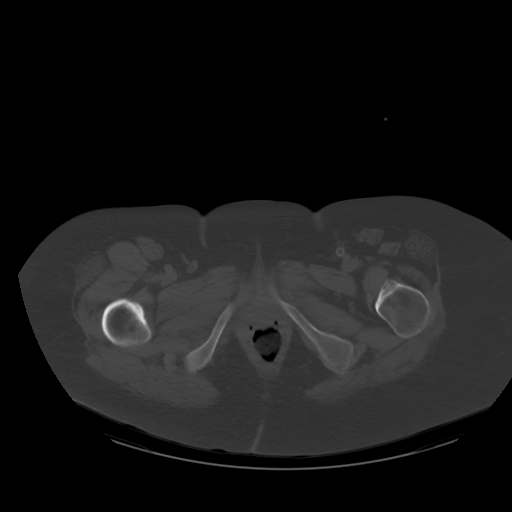
[im 29/99  soft-tissue]
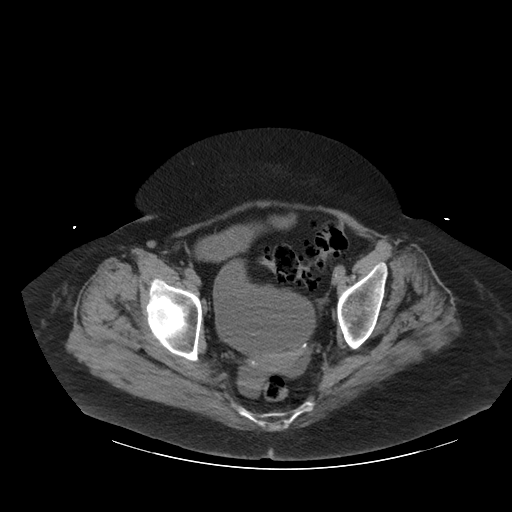
[im 50/99  soft-tissue]
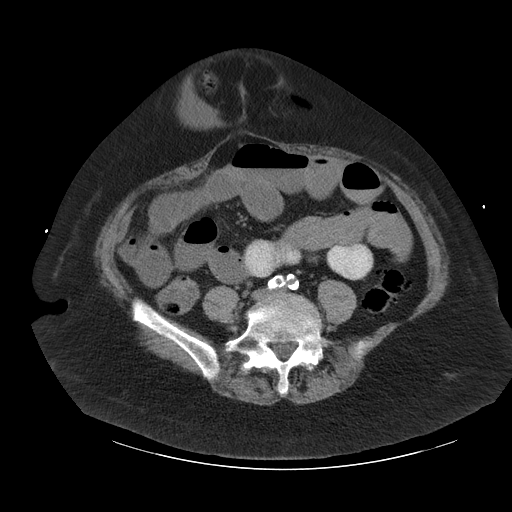
[im 70/99  soft-tissue]
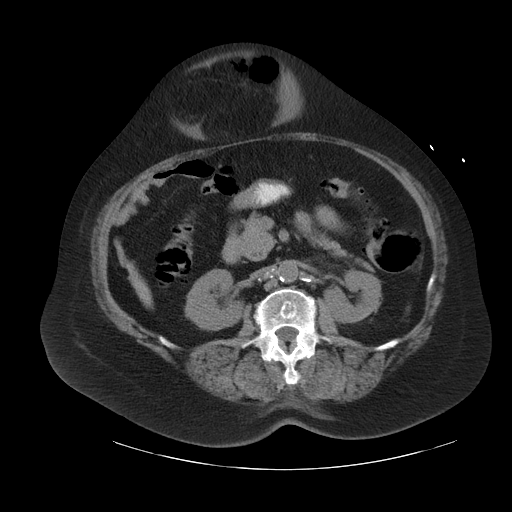
[im 86/99  soft-tissue]
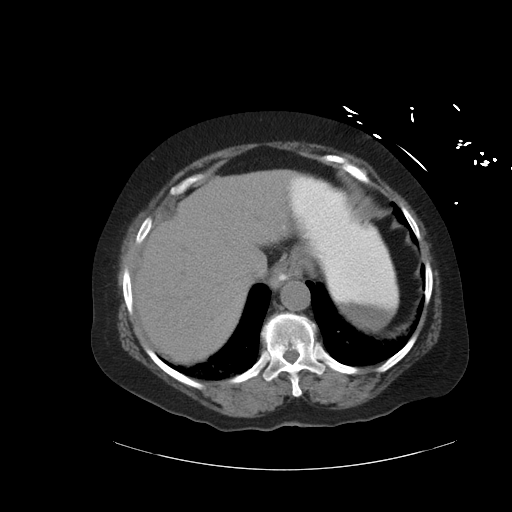

[Series 203: coronals, idose (2) · coronal · 0.45mm/px · 3 of 160 slices shown]
[im 54/160  soft-tissue]
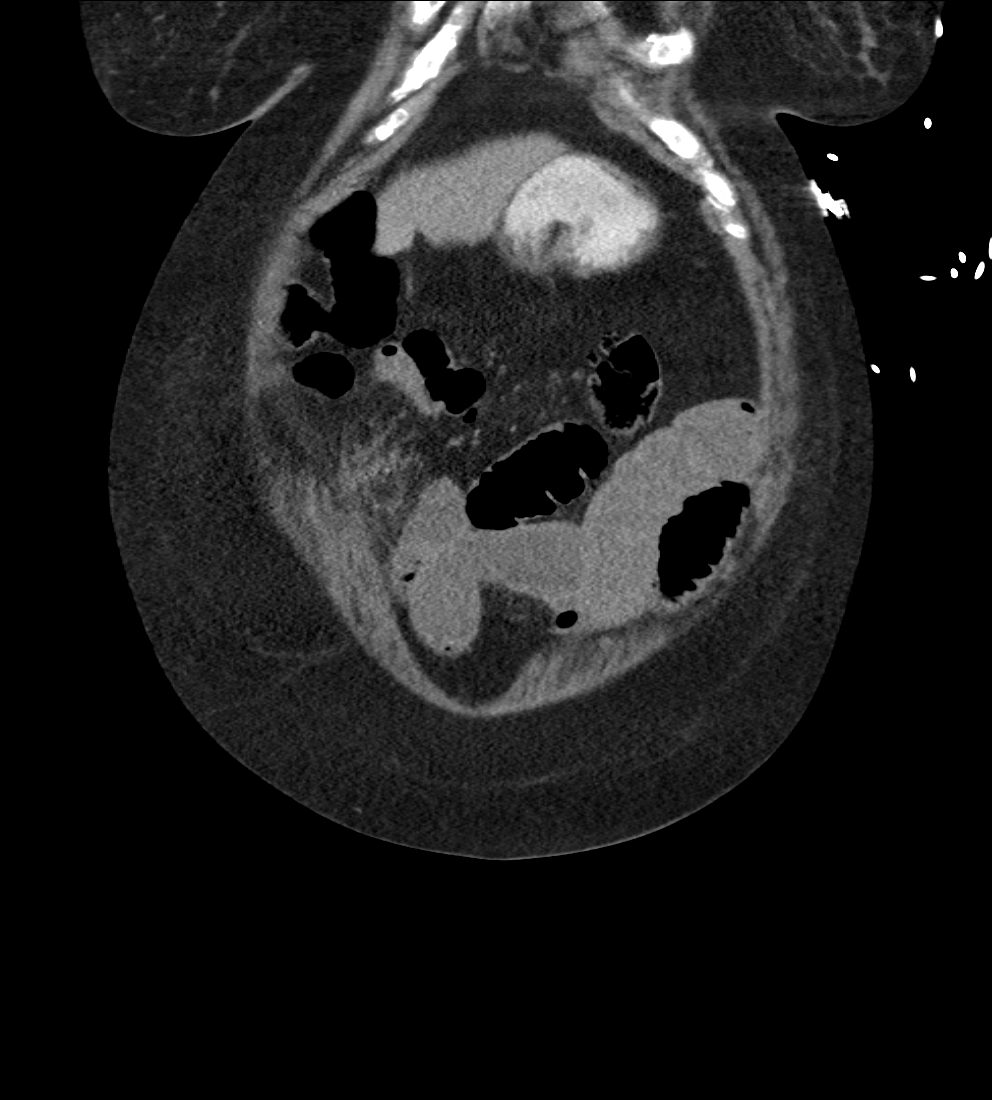
[im 71/160  soft-tissue]
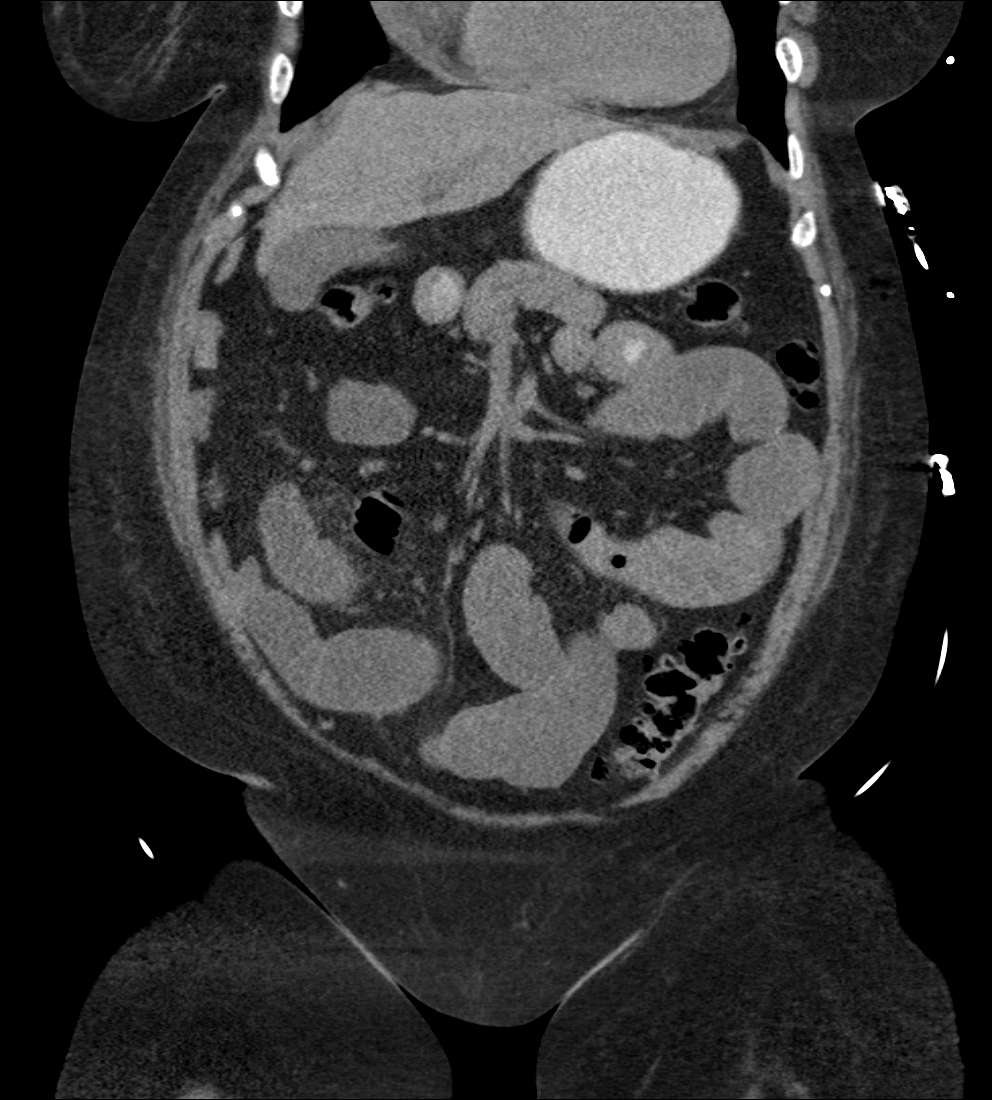
[im 89/160  soft-tissue]
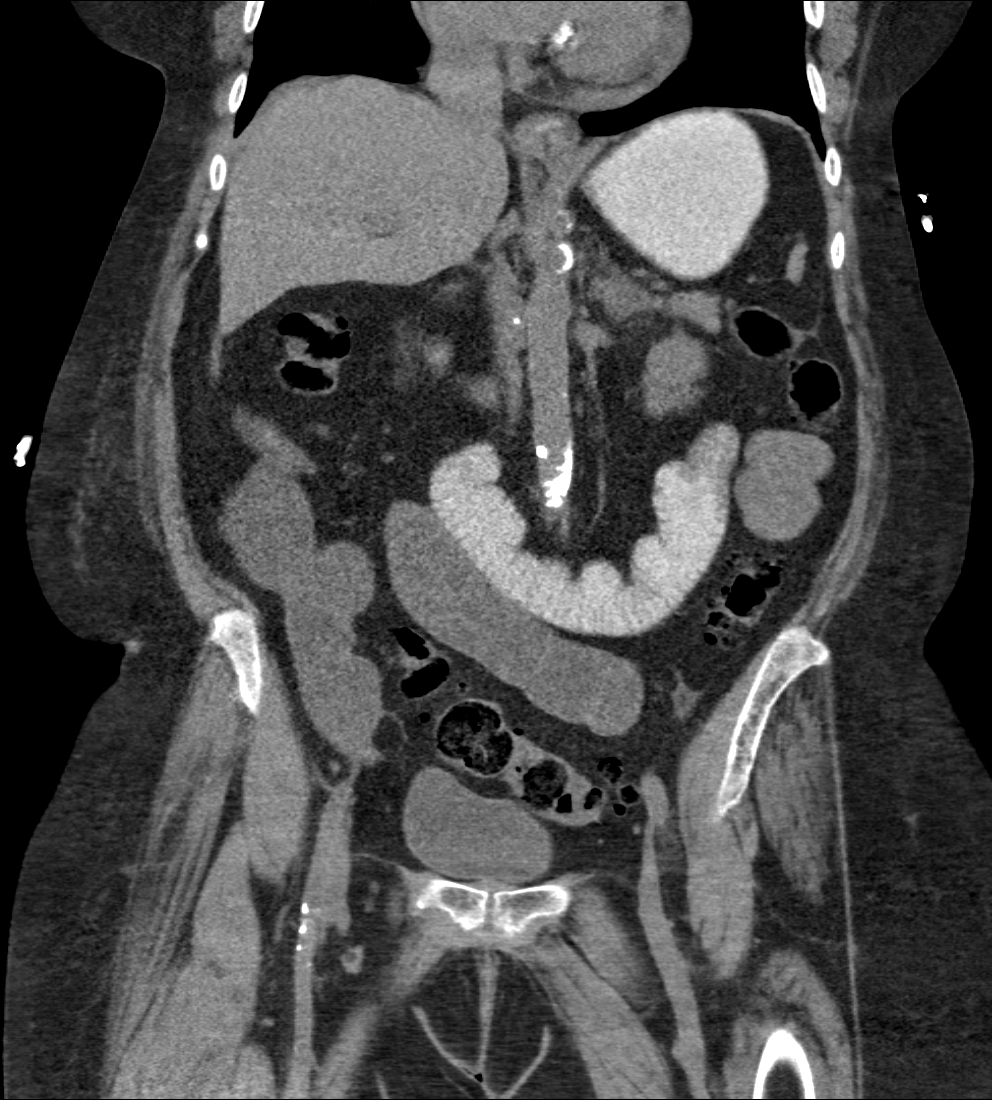

[8 of 46 positions shown; findings below may reference images not displayed]

FINDINGS: Lower chest: The visualized lung bases are clear. There is coronary
vascular calcification. Partially visualized pericardial effusion.

No intra-abdominal free air.  Small free fluid within the pelvis.

Hepatobiliary: There multiple stones within the gallbladder. No
pericholecystic fluid. The liver is unremarkable. No intrahepatic
biliary ductal dilatation

Pancreas: Unremarkable. No pancreatic ductal dilatation or
surrounding inflammatory changes.

Spleen: Normal in size without focal abnormality.

Adrenals/Urinary Tract: Bilateral adrenal thickening/ hyperplasia
versus adenoma similar to prior study. The kidneys are mildly
atrophic. There is no hydronephrosis or nephrolithiasis on either
side. The visualized ureters and urinary bladder appear
unremarkable.

Stomach/Bowel: There is a small hiatal hernia with gastroesophageal
reflux. There is a 5.2 cm supraumbilical ventral defect with
herniation of portion of the transverse colon and multiple loops of
small bowel. There is focal compression and narrowing of a loop of
bowel as it exits the hernia (series 201, image 36) with dilatation
of small bowel loop proximal to the exit point within the hernia sac
as well as loops of small bowel proximal to the hernia. Small amount
of fluid is noted within the hernia sac. There is extensive colonic
diverticulosis without active inflammatory changes. Normal appendix.

Vascular/Lymphatic: Advanced aortoiliac atherosclerotic disease.
Evaluation of the vasculature is limited in the absence of contrast.
No portal venous gas identified. There is no adenopathy.

Reproductive: The uterus and the ovaries are grossly unremarkable.

Other: None

Musculoskeletal: There is multilevel degenerative changes of the
spine with facet hypertrophy and osteophyte formation. No acute
fracture.
IMPRESSION: Large supraumbilical ventral hernia containing a segment of the
transverse colon and small bowel. There is compression of the small
bowel at the neck of the hernia with small bowel obstruction.
Clinical correlation and surgical consult is advised. No free air.

Colonic diverticulosis.

Cholelithiasis.

## 2015-12-30 MED ORDER — INSULIN ASPART 100 UNIT/ML ~~LOC~~ SOLN: 0.0000 [IU] | Freq: Three times a day (TID) | SUBCUTANEOUS | Status: DC

## 2015-12-30 MED ORDER — SODIUM CHLORIDE 0.9 % IV SOLN: INTRAVENOUS | Status: DC

## 2015-12-30 MED ORDER — MORPHINE SULFATE (PF) 2 MG/ML IV SOLN
2.0000 mg | INTRAVENOUS | Status: DC | PRN
  Administered 2015-12-30 – 2016-01-07 (×10): 2 mg via INTRAVENOUS
  Filled 2015-12-30 (×10): qty 1

## 2015-12-30 MED ORDER — INSULIN ASPART 100 UNIT/ML ~~LOC~~ SOLN
10.0000 [IU] | Freq: Once | SUBCUTANEOUS | Status: AC
  Administered 2015-12-30: 10 [IU] via INTRAVENOUS
  Filled 2015-12-30: qty 1

## 2015-12-30 MED ORDER — SODIUM CHLORIDE 0.9 % IV BOLUS (SEPSIS)
1000.0000 mL | Freq: Once | INTRAVENOUS | Status: AC
  Administered 2015-12-30: 1000 mL via INTRAVENOUS

## 2015-12-30 MED ORDER — SODIUM POLYSTYRENE SULFONATE 15 GM/60ML PO SUSP
15.0000 g | Freq: Once | ORAL | Status: AC
  Administered 2015-12-30: 15 g via RECTAL
  Filled 2015-12-30: qty 60

## 2015-12-30 MED ORDER — ONDANSETRON HCL 4 MG/2ML IJ SOLN
4.0000 mg | Freq: Four times a day (QID) | INTRAMUSCULAR | Status: DC | PRN
  Administered 2016-01-05 – 2016-01-07 (×2): 4 mg via INTRAVENOUS
  Filled 2015-12-30 (×2): qty 2

## 2015-12-30 MED ORDER — SODIUM CHLORIDE 0.9 % IV SOLN
INTRAVENOUS | Status: DC
  Administered 2015-12-30 (×2): via INTRAVENOUS

## 2015-12-30 MED ORDER — DEXTROSE 50 % IV SOLN
1.0000 | Freq: Once | INTRAVENOUS | Status: AC
  Administered 2015-12-30: 50 mL via INTRAVENOUS
  Filled 2015-12-30: qty 50

## 2015-12-30 MED ORDER — IOPAMIDOL (ISOVUE-300) INJECTION 61%
INTRAVENOUS | Status: AC
Start: 2015-12-30 — End: 2015-12-30
  Filled 2015-12-30: qty 30

## 2015-12-30 MED ORDER — ALBUTEROL SULFATE (2.5 MG/3ML) 0.083% IN NEBU
5.0000 mg | INHALATION_SOLUTION | Freq: Once | RESPIRATORY_TRACT | Status: AC
  Administered 2015-12-30: 5 mg via RESPIRATORY_TRACT
  Filled 2015-12-30: qty 6

## 2015-12-30 MED ORDER — MORPHINE SULFATE (PF) 4 MG/ML IV SOLN
4.0000 mg | Freq: Once | INTRAVENOUS | Status: AC
  Administered 2015-12-30: 4 mg via INTRAVENOUS
  Filled 2015-12-30: qty 1

## 2015-12-30 MED ORDER — ONDANSETRON HCL 4 MG PO TABS: 4.0000 mg | ORAL_TABLET | Freq: Four times a day (QID) | ORAL | Status: DC | PRN

## 2015-12-30 MED ORDER — ACETAMINOPHEN 650 MG RE SUPP: 650.0000 mg | Freq: Four times a day (QID) | RECTAL | Status: DC | PRN

## 2015-12-30 MED ORDER — PNEUMOCOCCAL VAC POLYVALENT 25 MCG/0.5ML IJ INJ
0.5000 mL | INJECTION | INTRAMUSCULAR | Status: DC
  Filled 2015-12-30 (×2): qty 0.5

## 2015-12-30 MED ORDER — ONDANSETRON HCL 4 MG/2ML IJ SOLN
4.0000 mg | Freq: Once | INTRAMUSCULAR | Status: AC
  Administered 2015-12-30: 4 mg via INTRAVENOUS
  Filled 2015-12-30: qty 2

## 2015-12-30 MED ORDER — INFLUENZA VAC SPLIT QUAD 0.5 ML IM SUSY
0.5000 mL | PREFILLED_SYRINGE | INTRAMUSCULAR | Status: AC
  Administered 2016-01-09: 0.5 mL via INTRAMUSCULAR
  Filled 2015-12-30: qty 0.5

## 2015-12-30 MED ORDER — ACETAMINOPHEN 325 MG PO TABS: 650.0000 mg | ORAL_TABLET | Freq: Four times a day (QID) | ORAL | Status: DC | PRN

## 2015-12-30 MED ORDER — LABETALOL HCL 5 MG/ML IV SOLN: 5.0000 mg | INTRAVENOUS | Status: DC | PRN

## 2015-12-30 MED ORDER — KETOROLAC TROMETHAMINE 15 MG/ML IJ SOLN: 15.0000 mg | Freq: Four times a day (QID) | INTRAMUSCULAR | Status: DC | PRN

## 2015-12-30 NOTE — ED Notes (Signed)
General surgery paged to be made aware of pt potassium and lactic acid.

## 2015-12-30 NOTE — ED Notes (Signed)
Nurse unavailable for report at this time.  RN will call back.

## 2015-12-30 NOTE — ED Notes (Signed)
Attempted lab draw x1.  Phlebotomy made aware.

## 2015-12-30 NOTE — Consult Note (Signed)
Reason for Consult:Incarcerated ventral hernia Referring Physician: [name] is an 70 y.o. female.  HPI: Long history of an incarcerated hernia, over the last 24 hours developed nausea and vomiting with increased pain in the ventral hernia.  No surgical referral because hwernia has been asymptomatic.  Wide mouth hernia, no prior repair.  Past Medical History:  Diagnosis Date  . Chronic renal insufficiency    Cr 1.38-1.93 in 2009  . Hernia   . Hypercholesteremia   . Hypertension   . PVD (peripheral vascular disease) ([place])    s/p LAKA  . Umbilical hernia     Past Surgical History:  Procedure Laterality Date  . AMPUTATION  06/06/2011   Procedure: AMPUTATION DIGIT;  Surgeon: Stephanie Dutch, MD;  Location: [facility] OR;  Service: Vascular;  Laterality: Left;  Transmetatarsal amputation left great toe  . AMPUTATION  06/12/2011   Procedure: AMPUTATION ABOVE KNEE;  Surgeon: Stephanie Dutch, MD;  Location: [place];  Service: Vascular;  Laterality: Left;  . FEMORAL-POPLITEAL BYPASS GRAFT  06/06/2011   Procedure: BYPASS GRAFT FEMORAL-POPLITEAL ARTERY;  Surgeon: Stephanie Dutch, MD;  Location: [facility] OR;  Service: Vascular;  Laterality: Left;  left femoral to popliteal bypass with composite vein and propaten 67m x 80 graft  . LOWER EXTREMITY ANGIOGRAM N/A 06/04/2011   Procedure: LOWER EXTREMITY ANGIOGRAM;  Surgeon: CAngelia Mould MD;  [facility];  Service: Cardiovascular;  Laterality: N/A;  . TUBAL LIGATION      Family History  Problem Relation Age of Onset  . Heart attack Father   . Heart disease Father   . Diabetes Mother   . Hypertension Mother   . Heart attack Brother     Social History:  reports that she quit smoking about 4 years ago. She has a 100.00 pack-year smoking history. She has never used smokeless tobacco. She reports that she does not drink alcohol or use drugs.  Allergies: No Known Allergies  Medications: I have reviewed the patient's current  medications.  Results for orders placed or performed during the hospital encounter of 12/30/15 (from the past 48 hour(s))  Lipase, blood     Status: None   Collection Time: 12/29/15  9:54 PM  Result Value Ref Range   Lipase 47 11 - 51 U/L  Comprehensive metabolic panel     Status: Abnormal   Collection Time: 12/29/15  9:54 PM  Result Value Ref Range   Sodium 134 (L) 135 - 145 mmol/L   Potassium 6.4 (HH) 3.5 - 5.1 mmol/L    Comment: NO VISIBLE HEMOLYSIS CRITICAL RESULT CALLED TO, READ BACK BY AND VERIFIED WITH: Stephanie Griffith 12/29/15 2233 WAYK    Chloride 110 101 - 111 mmol/L   CO2 17 (L) 22 - 32 mmol/L   Glucose, Bld 159 (H) 65 - 99 mg/dL   BUN 38 (H) 6 - 20 mg/dL   Creatinine, Ser 1.88 (H) 0.44 - 1.00 mg/dL   Calcium 9.1 8.9 - 10.3 mg/dL   Total Protein 8.8 (H) 6.5 - 8.1 g/dL   Albumin 4.0 3.5 - 5.0 g/dL   AST 23 15 - 41 U/L   ALT 15 14 - 54 U/L   Alkaline Phosphatase 178 (H) 38 - 126 U/L   Total Bilirubin 0.4 0.3 - 1.2 mg/dL   GFR calc non Af Amer 26 (L) >60 mL/min   GFR calc Af Amer 30 (L) >60 mL/min    Comment: (NOTE) The eGFR has been calculated using the  CKD EPI equation. This calculation has not been validated in all clinical situations. eGFR's persistently <60 mL/min signify possible Chronic Kidney Disease.    Anion gap 7 5 - 15  CBC with Differential     Status: Abnormal   Collection Time: 12/29/15  9:54 PM  Result Value Ref Range   WBC 13.7 (H) 4.0 - 10.5 K/uL   RBC 3.92 3.87 - 5.11 MIL/uL   Hemoglobin 11.4 (L) 12.0 - 15.0 g/dL   HCT 36.9 36.0 - 46.0 %   MCV 94.1 78.0 - 100.0 fL   MCH 29.1 26.0 - 34.0 pg   MCHC 30.9 30.0 - 36.0 g/dL   RDW 13.8 11.5 - 15.5 %   Platelets 325 150 - 400 K/uL   Neutrophils Relative % 90 %   Neutro Abs 12.3 (H) 1.7 - 7.7 K/uL   Lymphocytes Relative 7 %   Lymphs Abs 1.0 0.7 - 4.0 K/uL   Monocytes Relative 3 %   Monocytes Absolute 0.5 0.1 - 1.0 K/uL   Eosinophils Relative 0 %   Eosinophils Absolute 0.0 0.0 - 0.7 K/uL    Basophils Relative 0 %   Basophils Absolute 0.0 0.0 - 0.1 K/uL  Urinalysis, Routine w reflex microscopic     Status: Abnormal   Collection Time: 12/29/15 10:22 PM  Result Value Ref Range   Color, Urine YELLOW YELLOW   APPearance CLEAR CLEAR   Specific Gravity, Urine 1.020 1.005 - 1.030   pH 6.5 5.0 - 8.0   Glucose, UA NEGATIVE NEGATIVE mg/dL   Hgb urine dipstick TRACE (A) NEGATIVE   Bilirubin Urine NEGATIVE NEGATIVE   Ketones, ur NEGATIVE NEGATIVE mg/dL   Protein, ur 30 (A) NEGATIVE mg/dL   Nitrite NEGATIVE NEGATIVE   Leukocytes, UA TRACE (A) NEGATIVE  Urine microscopic-add on     Status: Abnormal   Collection Time: 12/29/15 10:22 PM  Result Value Ref Range   Squamous Epithelial / LPF 0-5 (A) NONE SEEN   WBC, UA 0-5 0 - 5 WBC/hpf   RBC / HPF 0-5 0 - 5 RBC/hpf   Bacteria, UA RARE (A) NONE SEEN  I-Stat CG4 Lactic Acid, ED     Status: Abnormal   Collection Time: 12/30/15  1:14 AM  Result Value Ref Range   Lactic Acid, Venous 1.92 (HH) 0.5 - 1.9 mmol/L  Potassium     Status: Abnormal   Collection Time: 12/30/15  2:36 AM  Result Value Ref Range   Potassium 6.6 (HH) 3.5 - 5.1 mmol/L    Comment: CRITICAL RESULT CALLED TO, READ BACK BY AND VERIFIED WITH: Stephanie Griffith 12/30/15 0330 WAYK     Ct Abdomen Pelvis Wo Contrast  Result Date: 12/30/2015 CLINICAL DATA:  70 year old female with abdominal pain. Ventral hernia. EXAM: CT ABDOMEN AND PELVIS WITHOUT CONTRAST TECHNIQUE: Multidetector CT imaging of the abdomen and pelvis was performed following the standard protocol without IV contrast. COMPARISON:  Abdominal CT dated 05/31/2011 and ultrasound dated 06/02/2011 FINDINGS: Lower chest: The visualized lung bases are clear. There is coronary vascular calcification. Partially visualized pericardial effusion. No intra-abdominal free air.  Small free fluid within the pelvis. Hepatobiliary: There multiple stones within the gallbladder. No pericholecystic fluid. The liver is unremarkable. No  intrahepatic biliary ductal dilatation Pancreas: Unremarkable. No pancreatic ductal dilatation or surrounding inflammatory changes. Spleen: Normal in size without focal abnormality. Adrenals/Urinary Tract: Bilateral adrenal thickening/ hyperplasia versus adenoma similar to prior study. The kidneys are mildly atrophic. There is no hydronephrosis or nephrolithiasis on either side. The  visualized ureters and urinary bladder appear unremarkable. Stomach/Bowel: There is a small hiatal hernia with gastroesophageal reflux. There is a 5.2 cm supraumbilical ventral defect with herniation of portion of the transverse colon and multiple loops of small bowel. There is focal compression and narrowing of a loop of bowel as it exits the hernia (series 201, image 36) with dilatation of small bowel loop proximal to the exit point within the hernia sac as well as loops of small bowel proximal to the hernia. Small amount of fluid is noted within the hernia sac. There is extensive colonic diverticulosis without active inflammatory changes. Normal appendix. Vascular/Lymphatic: Advanced aortoiliac atherosclerotic disease. Evaluation of the vasculature is limited in the absence of contrast. No portal venous gas identified. There is no adenopathy. Reproductive: The uterus and the ovaries are grossly unremarkable. Other: None Musculoskeletal: There is multilevel degenerative changes of the spine with facet hypertrophy and osteophyte formation. No acute fracture. IMPRESSION: Large supraumbilical ventral hernia containing a segment of the transverse colon and small bowel. There is compression of the small bowel at the neck of the hernia with small bowel obstruction. Clinical correlation and surgical consult is advised. No free air. Colonic diverticulosis. Cholelithiasis. Electronically Signed   By: Anner Crete Griffith.D.   On: 12/30/2015 03:43    Review of Systems  Constitutional: Negative for chills and fever.  Respiratory: Negative for  shortness of breath.   Cardiovascular: Negative for chest pain.  Gastrointestinal: Positive for abdominal pain, nausea and vomiting.  All other systems reviewed and are negative.  Blood pressure 148/88, pulse 98, temperature 98.2 F (36.8 C), temperature source Oral, resp. rate 16, SpO2 95 %. Physical Exam  Constitutional: She is oriented to person, place, and time. She appears well-developed and well-nourished.  obese  HENT:  Head: Normocephalic and atraumatic.  Eyes: Conjunctivae and EOM are normal. Pupils are equal, round, and reactive to light.  Neck: Normal range of motion. Neck supple.  Cardiovascular: Normal rate, regular rhythm and normal heart sounds.   Respiratory: Effort normal and breath sounds normal.  GI: She exhibits distension and mass (Incarcerated ventral hernia). Bowel sounds are decreased. There is tenderness (at hernia site). A hernia is present. Hernia confirmed positive in the ventral area (incarcerated).  Musculoskeletal: She exhibits deformity (Left AKA).       Legs: Neurological: She is alert and oriented to person, place, and time. She has normal reflexes.  Skin: Skin is warm and dry.  Psychiatric: She has a normal mood and affect. Her behavior is normal. Judgment and thought content normal.    Assessment/Plan: Incarcerated and obstructing de novo ventral hernia.  No prior abdominal surgery.  Bowel appears to be viable currently. Hyperkalemia. Renal insufficiency.  Will need close attention by medical service for renal insufficiency, hyperkalemia prior to going to surgery. Probably needs admission to at least a stepdown unit.   Noreta Kue 12/30/2015, 6:46 AM

## 2015-12-30 NOTE — ED Notes (Addendum)
Surgery returned page and made aware of pt labs and condition.

## 2015-12-30 NOTE — ED Notes (Signed)
REPAGED GENERAL SURGERY

## 2015-12-30 NOTE — H&P (Signed)
History and Physical    JOYLYN DUGGIN KDX:[id number] DOB: [birth year] DOA: 12/30/2015  PCP: Kandice Hams, MD Patient coming from: home  Chief Complaint: abdominal pain/nausea and vomiting  HPI: Stephanie Griffith is a very pleasant 70 y.o. female with medical history significant htn, ckd, peripheral vascular disease status post left above-the-knee amputation 2013, hyperglycemia, umbilical hernia presents to the emergency Department chief complaint abdominal pain 4 episodes of nausea and vomiting. Initial evaluation reveals incarcerated and obstructing ventral hernia, hyperkalemia, hyperglycemia and chronic kidney disease.  Information is obtained from the patient. She reports a long history of incarcerated hernia but has always been asymptomatic. She reports over the last 24 hours she developed abdominal pain nausea and vomiting. She reports at least 4 episodes of vomiting yesterday. She denies any coffee ground emesis. She states the pain is constant in location and intensity rates it an 8 out of 10. Located umbilical area the site of her hernia. She also reports hernia appears larger than usual.  She does report a normal bowel movement yesterday. She denies any fever chills headache dizziness syncope or near-syncope. She denies chest pain palpitations shortness of breath. She denies dysuria hematuria frequency or urgency. She reports she is mostly wheelchair bound since her left AKA.     ED Course: In the emergency department she's afebrile hemodynamically stable slightly tachycardic and not hypoxic. She is provided with 3 L of normal saline morphine IV insulin albuterol and an amp of D50 for potassium of 6.6.  Review of Systems: As per HPI otherwise 10 point review of systems negative.   Ambulatory Status: Mostly wheelchair bound. Is independent with transferring and ADLs  Past Medical History:  Diagnosis Date  . Chronic renal insufficiency    Cr 1.38-1.93 in 2009  . Hernia   .  Hypercholesteremia   . Hyperglycemia   . Hypertension   . PVD (peripheral vascular disease) ([place])    s/p LAKA  . Umbilical hernia     Past Surgical History:  Procedure Laterality Date  . AMPUTATION  06/06/2011   Procedure: AMPUTATION DIGIT;  Surgeon: Elam Dutch, MD;  Location: [facility] OR;  Service: Vascular;  Laterality: Left;  Transmetatarsal amputation left great toe  . AMPUTATION  06/12/2011   Procedure: AMPUTATION ABOVE KNEE;  Surgeon: Elam Dutch, MD;  Location: [place];  Service: Vascular;  Laterality: Left;  . FEMORAL-POPLITEAL BYPASS GRAFT  06/06/2011   Procedure: BYPASS GRAFT FEMORAL-POPLITEAL ARTERY;  Surgeon: Elam Dutch, MD;  Location[facility] OR;  Service: Vascular;  Laterality: Left;  left femoral to popliteal bypass with composite vein and propaten 49mm x 80 graft  . LOWER EXTREMITY ANGIOGRAM N/A 06/04/2011   Procedure: LOWER EXTREMITY ANGIOGRAM;  Surgeon: Angelia Mould, MD;  Location: [facility] CATH LAB;  Service: Cardiovascular;  Laterality: N/A;  . TUBAL LIGATION      Social History   Social History  . Marital status: Married    Spouse name: N/A  . Number of children: N/A  . Years of education: N/A   Occupational History  . Not on file.   Social History Main Topics  . Smoking status: Former Smoker    Packs/day: 2.00    Years: 50.00    Quit date: 06/01/2011  . Smokeless tobacco: Never Used  . Alcohol use No  . Drug use: No  . Sexual activity: Not Currently   Other Topics Concern  . Not on file   Social History Narrative  . No narrative on file  She lives at home with her daughter. She is a retired Psychologist, counselling. She is independent with ADLs but is wheelchair-bound No Known Allergies  Family History  Problem Relation Age of Onset  . Heart attack Father   . Heart disease Father   . Diabetes Mother   . Hypertension Mother   . Heart attack Brother     Prior to Admission medications   Medication Sig Start Date End Date Taking? Authorizing  Provider  amLODipine (NORVASC) 5 MG tablet Take 10 mg by mouth daily.  07/08/14  Yes Historical Provider, MD  aspirin 81 MG EC tablet Take 81 mg by mouth daily.     Yes Historical Provider, MD  atorvastatin (LIPITOR) 40 MG tablet Take 40 mg by mouth daily.   Yes Historical Provider, MD  carvedilol (COREG) 25 MG tablet Take 25 mg by mouth 2 (two) times daily with a meal.   Yes Historical Provider, MD  famotidine (PEPCID) 20 MG tablet Take 20 mg by mouth 2 (two) times daily.   Yes Historical Provider, MD  gabapentin (NEURONTIN) 100 MG capsule Take 100 mg by mouth 2 (two) times daily.  06/23/14  Yes Historical Provider, MD  olmesartan-hydrochlorothiazide (BENICAR HCT) 40-25 MG per tablet Take 1 tablet by mouth daily.   Yes Historical Provider, MD    Physical Exam: Vitals:   12/30/15 0515 12/30/15 0530 12/30/15 0600 12/30/15 0700  BP: 149/77 146/80 148/88 146/85  Pulse: 98 100 98 109  Resp: 19 14 16 21   Temp:      TempSrc:      SpO2: 95% 95% 95% 97%     General:  Appears calm and Only slightly uncomfortable Eyes:  PERRL, EOMI, normal lids, iris ENT:  grossly normal hearing, lips & tongue, mucous membranes of her mouth are pink and somewhat dry Neck:  no LAD, masses or thyromegaly Cardiovascular:  RRR, no m/r/g left above-the-knee amputation. Right lower extremity with trace edema Respiratory:  CTA bilaterally, no w/r/r. Normal respiratory effort. Abdomen:  Distended and large ventral hernia attributing bowel sounds diminished throughout tender to palpation Skin:  no rash or induration seen on limited exam Musculoskeletal:  grossly normal tone BUE/BLE, good ROM, no bony abnormality Psychiatric:  grossly normal mood and affect, speech fluent and appropriate, AOx3 Neurologic:  CN 2-12 grossly intact, moves all extremities in coordinated fashion, sensation intact  Labs on Admission: I have personally reviewed following labs and imaging studies  CBC:  Recent Labs Lab 12/29/15 2154  WBC  13.7*  NEUTROABS 12.3*  HGB 11.4*  HCT 36.9  MCV 94.1  PLT 353   Basic Metabolic Panel:  Recent Labs Lab 12/29/15 2154 12/30/15 0236  NA 134*  --   K 6.4* 6.6*  CL 110  --   CO2 17*  --   GLUCOSE 159*  --   BUN 38*  --   CREATININE 1.88*  --   CALCIUM 9.1  --    GFR: CrCl cannot be calculated (Unknown ideal weight.). Liver Function Tests:  Recent Labs Lab 12/29/15 2154  AST 23  ALT 15  ALKPHOS 178*  BILITOT 0.4  PROT 8.8*  ALBUMIN 4.0    Recent Labs Lab 12/29/15 2154  LIPASE 47   No results for input(s): AMMONIA in the last 168 hours. Coagulation Profile: No results for input(s): INR, PROTIME in the last 168 hours. Cardiac Enzymes: No results for input(s): CKTOTAL, CKMB, CKMBINDEX, TROPONINI in the last 168 hours. BNP (last 3 results) No results for input(s):  PROBNP in the last 8760 hours. HbA1C: No results for input(s): HGBA1C in the last 72 hours. CBG: No results for input(s): GLUCAP in the last 168 hours. Lipid Profile: No results for input(s): CHOL, HDL, LDLCALC, TRIG, CHOLHDL, LDLDIRECT in the last 72 hours. Thyroid Function Tests: No results for input(s): TSH, T4TOTAL, FREET4, T3FREE, THYROIDAB in the last 72 hours. Anemia Panel: No results for input(s): VITAMINB12, FOLATE, FERRITIN, TIBC, IRON, RETICCTPCT in the last 72 hours. Urine analysis:    Component Value Date/Time   COLORURINE YELLOW 12/29/2015 2222   APPEARANCEUR CLEAR 12/29/2015 2222   LABSPEC 1.020 12/29/2015 2222   PHURINE 6.5 12/29/2015 2222   GLUCOSEU NEGATIVE 12/29/2015 2222   HGBUR TRACE (A) 12/29/2015 2222   BILIRUBINUR NEGATIVE 12/29/2015 2222   KETONESUR NEGATIVE 12/29/2015 2222   PROTEINUR 30 (A) 12/29/2015 2222   UROBILINOGEN 0.2 06/18/2011 1751   NITRITE NEGATIVE 12/29/2015 2222   LEUKOCYTESUR TRACE (A) 12/29/2015 2222    Creatinine Clearance: CrCl cannot be calculated (Unknown ideal weight.).  Sepsis Labs: @LABRCNTIP (procalcitonin:4,lacticidven:4) )No  results found for this or any previous visit (from the past 240 hour(s)).   Radiological Exams on Admission: Ct Abdomen Pelvis Wo Contrast  Result Date: 12/30/2015 CLINICAL DATA:  70 year old female with abdominal pain. Ventral hernia. EXAM: CT ABDOMEN AND PELVIS WITHOUT CONTRAST TECHNIQUE: Multidetector CT imaging of the abdomen and pelvis was performed following the standard protocol without IV contrast. COMPARISON:  Abdominal CT dated 05/31/2011 and ultrasound dated 06/02/2011 FINDINGS: Lower chest: The visualized lung bases are clear. There is coronary vascular calcification. Partially visualized pericardial effusion. No intra-abdominal free air.  Small free fluid within the pelvis. Hepatobiliary: There multiple stones within the gallbladder. No pericholecystic fluid. The liver is unremarkable. No intrahepatic biliary ductal dilatation Pancreas: Unremarkable. No pancreatic ductal dilatation or surrounding inflammatory changes. Spleen: Normal in size without focal abnormality. Adrenals/Urinary Tract: Bilateral adrenal thickening/ hyperplasia versus adenoma similar to prior study. The kidneys are mildly atrophic. There is no hydronephrosis or nephrolithiasis on either side. The visualized ureters and urinary bladder appear unremarkable. Stomach/Bowel: There is a small hiatal hernia with gastroesophageal reflux. There is a 5.2 cm supraumbilical ventral defect with herniation of portion of the transverse colon and multiple loops of small bowel. There is focal compression and narrowing of a loop of bowel as it exits the hernia (series 201, image 36) with dilatation of small bowel loop proximal to the exit point within the hernia sac as well as loops of small bowel proximal to the hernia. Small amount of fluid is noted within the hernia sac. There is extensive colonic diverticulosis without active inflammatory changes. Normal appendix. Vascular/Lymphatic: Advanced aortoiliac atherosclerotic disease. Evaluation of  the vasculature is limited in the absence of contrast. No portal venous gas identified. There is no adenopathy. Reproductive: The uterus and the ovaries are grossly unremarkable. Other: None Musculoskeletal: There is multilevel degenerative changes of the spine with facet hypertrophy and osteophyte formation. No acute fracture. IMPRESSION: Large supraumbilical ventral hernia containing a segment of the transverse colon and small bowel. There is compression of the small bowel at the neck of the hernia with small bowel obstruction. Clinical correlation and surgical consult is advised. No free air. Colonic diverticulosis. Cholelithiasis. Electronically Signed   By: Anner Crete M.D.   On: 12/30/2015 03:43    EKG: Independently reviewed. Sinus or ectopic atrial rhythm RVH with secondary repolarization abnrm   Assessment/Plan Principal Problem:   Incarcerated hernia Active Problems:   Essential hypertension, benign   Chronic  kidney disease (CKD), stage III (moderate)   Abdominal pain   Hyperglycemia   Acute on chronic renal failure (HCC)   PAD (peripheral artery disease) (HCC)   Ventral hernia   Hyperkalemia   #1. Incarcerated hernia. Long history of hernia but asymptomatic. Developed pain nausea and vomiting 24 hours ago. CT of the abdomen is noted above. Evaluated by general surgery in the emergency department who opined incarcerated and obstructing ventral hernia with viable bowel and recommend surgery after renal insufficiency and hyperkalemia addressed -Admit to step down -Nothing by mouth -Supportive therapy in the form of IV fluids anti-emetic analgesia -Track lactic acid -Obtain INR -Obtain chest x-ray -surgery  #2. Hyperkalemia. Likely related to progression of chronic kidney disease in the setting of acute illness. Potassium 6.4 last evening and trending up his morning. The insulin albuterol and D50 provided this morning at 7:30. 2 L of normal saline also provided -Patient will  be nothing by mouth due to pending surgery -Recheck potassium  #3. Acute on chronic kidney disease stage III. Creatinine currently 1.8. Chart review indicates 3 years ago creatinine closer 1.5. Suspect this is more of a progression of chronic illness laid too difficult to control hypertension and ongoing hyperglycemia. -Hold nephrotoxins -Gentle IV fluids -Monitor urine output  #4. Hyperglycemia. Insulin naive.  Patient reports never diagnosed with diabetes but has had hyperglycemia for many years. Serum glucose 159 on admission. -Nothing by mouth secondary to #1 -Obtain a hemoglobin A1c -Sliding scale insulin for optimal control -Monitor closely  #5. Hypertension. Griffith control in the emergency department. Home medications include amlodipine, Coreg, hydrochlorothiazide,olmesartan. -We'll hold these medications for now do to nothing by mouth status -Provide when necessary labetalol  #6. history of peripheral vascular disease status post left AKA. Lower extremity warm somewhat tight. Appears stable at baseline   DVT prophylaxis: scd Code Status: full  Family Communication: daughter at bedside  Disposition Plan: home when ready  Consults called: general surgery dr wyatt  Admission status: inpatient    Radene Gunning MD [facility]  If 7PM-7AM, please contact night-coverage [email or web address] Password [email or web address]  12/30/2015, 8:06 AM

## 2015-12-30 NOTE — ED Provider Notes (Addendum)
[facility] DEPT Provider Note   CSN: [id number] Arrival date & time: 12/29/15  2114  By signing my name below, I, Maud Deed. Royston Sinner, attest that this documentation has been prepared under the direction and in the presence of Blanchie Dessert, MD.  Electronically Signed: Maud Deed. Royston Sinner, ED Scribe. 12/30/15. 12:52 AM.    History   Chief Complaint Chief Complaint  Patient presents with  . Abdominal Pain   The history is provided by the patient. No language interpreter was used.    HPI Comments: Stephanie Griffith is a 70 y.o. female with a PMHx of HTN and ventral hernia who presents to the Emergency Department complaining of constant, unchanged abdominal pain onset 3:00 PM this afternoon. Pt states pain is located at site of her hernia . Family states hernia appears larger than usual this evening. Discomfort is made worse with palpation. No alleviating factors at this time. Pt also reports associated ongoing nausea and vomiting. 4 episodes of vomiting reporting since time onset of abdominal pain. Last episode of emesis at approximally 4:00 PM this evening. Pt states she has been able to pass gas since onset of symptoms. However, last normal bowel movement yesterday. No recent fever or chills.  PCP: Kandice Hams, MD    Past Medical History:  Diagnosis Date  . Chronic renal insufficiency    Cr 1.38-1.93 in 2009  . Hernia   . Hypercholesteremia   . Hypertension   . PVD (peripheral vascular disease) ([place])    s/p LAKA  . Umbilical hernia     Patient Active Problem List   Diagnosis Date Noted  . Phantom limb pain ([place]) 08/13/2011  . PVD (peripheral vascular disease) with claudication ([place]) 07/26/2011  . S/P AKA (above knee amputation) ([place]) 06/15/2011  . NSTEMI (non-ST elevated myocardial infarction) ([place]) 06/09/2011  . Acute diastolic heart failure ([place]) 06/09/2011  . Atrial fibrillation ([place]) 06/08/2011  . Cholelithiasis and cholecystitis without obstruction 06/03/2011  .  Ventral hernia 06/03/2011  . Acute on chronic renal failure ([place]) 06/02/2011  . PAD (peripheral artery disease) ([place]) 06/02/2011  . 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 Abdominal pain 05/31/2011  . Leukocytosis 05/31/2011  . Hyponatremia 05/31/2011  . Hyperglycemia 05/31/2011  . ANEMIA-NOS 12/22/2007  . Chronic kidney disease (CKD), stage III (moderate) 12/22/2007  . CHEST PAIN, HX OF 12/22/2007  . Essential hypertension, benign 12/10/2007    Past Surgical History:  Procedure Laterality Date  . AMPUTATION  06/06/2011   Procedure: AMPUTATION DIGIT;  Surgeon: Elam Dutch, MD;  Location: [facility] OR;  Service: Vascular;  Laterality: Left;  Transmetatarsal amputation left great toe  . AMPUTATION  06/12/2011   Procedure: AMPUTATION ABOVE KNEE;  Surgeon: Elam Dutch, MD;  Location: [facility];  Service: Vascular;  Laterality: Left;  . FEMORAL-POPLITEAL BYPASS GRAFT  06/06/2011   Procedure: BYPASS GRAFT FEMORAL-POPLITEAL ARTERY;  Surgeon: Elam Dutch, MD;  Location[facility] OR;  Service: Vascular;  Laterality: Left;  left femoral to popliteal bypass with composite vein and propaten 59mm x 80 graft  . LOWER EXTREMITY ANGIOGRAM N/A 06/04/2011   Procedure: LOWER EXTREMITY ANGIOGRAM;  Surgeon: Angelia Mould, MD;  Location: [facility] CATH LAB;  Service: Cardiovascular;  Laterality: N/A;  . TUBAL LIGATION      OB History    No data available       Home Medications    Prior to Admission medications   Medication Sig Start Date End Date Taking? Authorizing Provider  amLODipine (NORVASC) 5 MG tablet Take  5 mg by mouth daily.  07/08/14   Historical Provider, MD  aspirin 81 MG EC tablet Take 81 mg by mouth daily.      Historical Provider, MD  atorvastatin (LIPITOR) 40 MG tablet Take 40 mg by mouth daily.    Historical Provider, MD  carvedilol (COREG) 25 MG tablet Take 25 mg by mouth 2 (two) times daily with a meal.    Historical Provider, MD  famotidine (PEPCID) 20 MG tablet Take 20 mg by mouth 2 (two) times  daily.    Historical Provider, MD  gabapentin (NEURONTIN) 100 MG capsule Take 100 mg by mouth 2 (two) times daily.  06/23/14   Historical Provider, MD  Multiple Vitamin (MULTIVITAMIN) capsule Take 1 capsule by mouth daily.    Historical Provider, MD  olmesartan-hydrochlorothiazide (BENICAR HCT) 40-25 MG per tablet Take 1 tablet by mouth daily.    Historical Provider, MD  oxyCODONE (OXY IR/ROXICODONE) 5 MG immediate release tablet Take 5 mg by mouth as needed. 06/26/11   Historical Provider, MD    Family History Family History  Problem Relation Age of Onset  . Heart attack Father   . Heart disease Father   . Diabetes Mother   . Hypertension Mother   . Heart attack Brother     Social History Social History  Substance Use Topics  . Smoking status: Former Smoker    Packs/day: 2.00    Years: 50.00    Quit date: 06/01/2011  . Smokeless tobacco: Never Used  . Alcohol use No     Allergies   Review of patient's allergies indicates no known allergies.   Review of Systems Review of Systems  Constitutional: Negative for chills and fever.  Gastrointestinal: Positive for abdominal pain, nausea and vomiting.  All other systems reviewed and are negative.    Physical Exam Updated Vital Signs BP 178/91 (BP Location: Left Arm)   Pulse 100   Temp 98.2 F (36.8 C) (Oral)   Resp 18   SpO2 100%   Physical Exam  Constitutional: She is oriented to person, place, and time. She appears well-developed and well-nourished. No distress.  HENT:  Head: Normocephalic and atraumatic.  Dry mucous membranes.  Eyes: EOM are normal.  Neck: Normal range of motion.  Cardiovascular: Normal rate, regular rhythm and normal heart sounds.   Pulmonary/Chest: Effort normal and breath sounds normal.  Abdominal: Soft.  Cantaloupe sized ventral hernia that is tense and tender. Ventral hernia is not reducible. Decreaseed bowel sounds.  Musculoskeletal: Normal range of motion.  Neurological: She is alert and  oriented to person, place, and time.  Skin: Skin is warm and dry.  Psychiatric: She has a normal mood and affect. Judgment normal.  Nursing note and vitals reviewed.    ED Treatments / Results   DIAGNOSTIC STUDIES: Oxygen Saturation is 100% on RA, Normal by my interpretation.    COORDINATION OF CARE: 12:48 AM- Will give Zofran, fluids, and Morphine. Will order blood work, imaging, and EKG. Discussed treatment plan with pt at bedside and pt agreed to plan.     Labs (all labs ordered are listed, but only abnormal results are displayed) Labs Reviewed  COMPREHENSIVE METABOLIC PANEL - Abnormal; Notable for the following:       Result Value   Sodium 134 (*)    Potassium 6.4 (*)    CO2 17 (*)    Glucose, Bld 159 (*)    BUN 38 (*)    Creatinine, Ser 1.88 (*)    Total  Protein 8.8 (*)    Alkaline Phosphatase 178 (*)    GFR calc non Af Amer 26 (*)    GFR calc Af Amer 30 (*)    All other components within normal limits  URINALYSIS, ROUTINE W REFLEX MICROSCOPIC (NOT AT [facility]) - Abnormal; Notable for the following:    Hgb urine dipstick TRACE (*)    Protein, ur 30 (*)    Leukocytes, UA TRACE (*)    All other components within normal limits  CBC WITH DIFFERENTIAL/PLATELET - Abnormal; Notable for the following:    WBC 13.7 (*)    Hemoglobin 11.4 (*)    Neutro Abs 12.3 (*)    All other components within normal limits  URINE MICROSCOPIC-ADD ON - Abnormal; Notable for the following:    Squamous Epithelial / LPF 0-5 (*)    Bacteria, UA RARE (*)    All other components within normal limits  LIPASE, BLOOD    EKG  EKG Interpretation  Date/Time:  Friday December 30 2015 00:51:23 EDT Ventricular Rate:  97 PR Interval:    QRS Duration: 88 QT Interval:  345 QTC Calculation: 439 R Axis:   157 Text Interpretation:  Sinus or ectopic atrial rhythm RVH with secondary repolarization abnrm Anterolateral infarct, age indeterminate New TWI in the anterolateral leads Confirmed by LIU MD, DANA  [phone]) on 12/30/2015 12:53:54 AM       Radiology Ct Abdomen Pelvis Wo Contrast  Result Date: 12/30/2015 CLINICAL DATA:  70 year old female with abdominal pain. Ventral hernia. EXAM: CT ABDOMEN AND PELVIS WITHOUT CONTRAST TECHNIQUE: Multidetector CT imaging of the abdomen and pelvis was performed following the standard protocol without IV contrast. COMPARISON:  Abdominal CT dated 05/31/2011 and ultrasound dated 06/02/2011 FINDINGS: Lower chest: The visualized lung bases are clear. There is coronary vascular calcification. Partially visualized pericardial effusion. No intra-abdominal free air.  Small free fluid within the pelvis. Hepatobiliary: There multiple stones within the gallbladder. No pericholecystic fluid. The liver is unremarkable. No intrahepatic biliary ductal dilatation Pancreas: Unremarkable. No pancreatic ductal dilatation or surrounding inflammatory changes. Spleen: Normal in size without focal abnormality. Adrenals/Urinary Tract: Bilateral adrenal thickening/ hyperplasia versus adenoma similar to prior study. The kidneys are mildly atrophic. There is no hydronephrosis or nephrolithiasis on either side. The visualized ureters and urinary bladder appear unremarkable. Stomach/Bowel: There is a small hiatal hernia with gastroesophageal reflux. There is a 5.2 cm supraumbilical ventral defect with herniation of portion of the transverse colon and multiple loops of small bowel. There is focal compression and narrowing of a loop of bowel as it exits the hernia (series 201, image 36) with dilatation of small bowel loop proximal to the exit point within the hernia sac as well as loops of small bowel proximal to the hernia. Small amount of fluid is noted within the hernia sac. There is extensive colonic diverticulosis without active inflammatory changes. Normal appendix. Vascular/Lymphatic: Advanced aortoiliac atherosclerotic disease. Evaluation of the vasculature is limited in the absence of contrast.  No portal venous gas identified. There is no adenopathy. Reproductive: The uterus and the ovaries are grossly unremarkable. Other: None Musculoskeletal: There is multilevel degenerative changes of the spine with facet hypertrophy and osteophyte formation. No acute fracture. IMPRESSION: Large supraumbilical ventral hernia containing a segment of the transverse colon and small bowel. There is compression of the small bowel at the neck of the hernia with small bowel obstruction. Clinical correlation and surgical consult is advised. No free air. Colonic diverticulosis. Cholelithiasis. Electronically Signed   By: Milas Hock  Radparvar M.D.   On: 12/30/2015 03:43    Procedures Procedures (including critical care time)  Medications Ordered in ED Medications - No data to display   Initial Impression / Assessment and Plan / ED Course  I have reviewed the triage vital signs and the nursing notes.  Pertinent labs & imaging results that were available during my care of the patient were reviewed by me and considered in my medical decision making (see chart for details).  Clinical Course   Patient is a 70 year old female with multiple medical problems presenting today with sudden onset of abdominal pain at 4 PM. She had multiple episodes of vomiting after this with ongoing pain. Patient on exam has a significant ventral hernia the size of a cantaloupe which is firm and tender to palpation. She has decreased bowel sounds. She denies any respiratory symptoms and was feeling her normal self until 4 PM.  Concern for bowel obstruction from incarcerated hernia.  Patient has not had any vomiting here and is improved with 4 mg of morphine.  Labs are significant for a lactic acid of 1.9, CMP with mild AK I to 1.8 but a potassium of 6.4. Patient was hydrated with IV fluid. EKG does show T-wave inversion in the anterior lateral leads but last EKG to compare was years ago. She denies any shortness of breath or chest pain. CT  is significant for a large supraumbilical ventral hernia with compression of the small bowel at the neck of the hernia with small bowel obstruction. Will discuss with surgery for further evaluation.  7:05 AM Pt seen by Dr. Hulen Skains who will most likely take pt to OR later today however given AKI and hyperkalemia will have medicine admit.  After 2L bolus will recheck BMP.  Pt currently comfortable.  Given albuterol/insulin and d50 for hyperkalemia  Final Clinical Impressions(s) / ED Diagnoses   Final diagnoses:  Small bowel obstruction ([place])  Incarcerated ventral hernia  Hyperkalemia  AKI (acute kidney injury) ([place])    New Prescriptions New Prescriptions   No medications on file   CRITICAL CARE Performed by: Blanchie Dessert Total critical care time: 30 minutes Critical care time was exclusive of separately billable procedures and treating other patients. Critical care was necessary to treat or prevent imminent or life-threatening deterioration. Critical care was time spent personally by me on the following activities: development of treatment plan with patient and/or surrogate as well as nursing, discussions with consultants, evaluation of patient's response to treatment, examination of patient, obtaining history from patient or surrogate, ordering and performing treatments and interventions, ordering and review of laboratory studies, ordering and review of radiographic studies, pulse oximetry and re-evaluation of patient's condition.  I personally performed the services described in this documentation, which was scribed in my presence.  The recorded information has been reviewed and considered.    Blanchie Dessert, MD 12/30/15 1194    Blanchie Dessert, MD 12/30/15 1740    Blanchie Dessert, MD 12/30/15 [phone]

## 2015-12-30 NOTE — ED Notes (Signed)
Emokpae MD made aware of pt lactic acid

## 2015-12-30 NOTE — Progress Notes (Signed)
Patient admitted from ED with RN on monitor. Patient is awake and oriented, VSS at this time. CCS on call MD paged concerning surgery tonight, patient will not go to OR tonight, able to have small amounts of ice chips, otherwise NPO. Family at bedside, updated.

## 2015-12-31 ENCOUNTER — Encounter (HOSPITAL_COMMUNITY): Payer: Self-pay | Admitting: Certified Registered Nurse Anesthetist

## 2015-12-31 ENCOUNTER — Inpatient Hospital Stay (HOSPITAL_COMMUNITY): Payer: Medicare Other | Admitting: Certified Registered"

## 2015-12-31 ENCOUNTER — Encounter (HOSPITAL_COMMUNITY)
Admission: EM | Disposition: A | Discharge status: Home Health | Payer: Self-pay | Source: Home / Self Care | Attending: Pulmonary Disease

## 2015-12-31 ENCOUNTER — Inpatient Hospital Stay (HOSPITAL_COMMUNITY): Payer: Medicare Other

## 2015-12-31 DIAGNOSIS — N179 Acute kidney failure, unspecified: Secondary | ICD-10-CM

## 2015-12-31 DIAGNOSIS — J95821 Acute postprocedural respiratory failure: Secondary | ICD-10-CM

## 2015-12-31 DIAGNOSIS — K460 Unspecified abdominal hernia with obstruction, without gangrene: Secondary | ICD-10-CM

## 2015-12-31 DIAGNOSIS — N183 Chronic kidney disease, stage 3 (moderate): Secondary | ICD-10-CM

## 2015-12-31 HISTORY — PX: VENTRAL HERNIA REPAIR: SHX424

## 2015-12-31 HISTORY — PX: OMENTECTOMY: SHX5985

## 2015-12-31 LAB — POCT I-STAT 3, ART BLOOD GAS (G3+)
Acid-base deficit: 17 mmol/L — ABNORMAL HIGH (ref 0.0–2.0)
Acid-base deficit: 13 mmol/L — ABNORMAL HIGH (ref 0.0–2.0)
Bicarbonate: 11.5 mmol/L — ABNORMAL LOW (ref 20.0–28.0)
Bicarbonate: 12.8 mmol/L — ABNORMAL LOW (ref 20.0–28.0)
O2 Saturation: 85 %
O2 Saturation: 99 %
Patient temperature: 98.3
Patient temperature: 99.2
TCO2: 13 mmol/L (ref 0–100)
TCO2: 14 mmol/L (ref 0–100)
pCO2 arterial: 34.9 mmHg (ref 32.0–48.0)
pCO2 arterial: 30 mmHg — ABNORMAL LOW (ref 32.0–48.0)
pH, Arterial: 7.127 — CL (ref 7.350–7.450)
pH, Arterial: 7.24 — ABNORMAL LOW (ref 7.350–7.450)
pO2, Arterial: 65 mmHg — ABNORMAL LOW (ref 83.0–108.0)
pO2, Arterial: 152 mmHg — ABNORMAL HIGH (ref 83.0–108.0)

## 2015-12-31 LAB — COMPREHENSIVE METABOLIC PANEL
ALT: 13 U/L — ABNORMAL LOW (ref 14–54)
AST: 24 U/L (ref 15–41)
Albumin: 3 g/dL — ABNORMAL LOW (ref 3.5–5.0)
Alkaline Phosphatase: 132 U/L — ABNORMAL HIGH (ref 38–126)
Anion gap: 4 — ABNORMAL LOW (ref 5–15)
BUN: 31 mg/dL — ABNORMAL HIGH (ref 6–20)
CO2: 16 mmol/L — ABNORMAL LOW (ref 22–32)
Calcium: 8.2 mg/dL — ABNORMAL LOW (ref 8.9–10.3)
Chloride: 119 mmol/L — ABNORMAL HIGH (ref 101–111)
Creatinine, Ser: 1.96 mg/dL — ABNORMAL HIGH (ref 0.44–1.00)
GFR calc Af Amer: 29 mL/min — ABNORMAL LOW (ref >60)
GFR calc non Af Amer: 25 mL/min — ABNORMAL LOW (ref >60)
Glucose, Bld: 123 mg/dL — ABNORMAL HIGH (ref 65–99)
Potassium: 5.7 mmol/L — ABNORMAL HIGH (ref 3.5–5.1)
Sodium: 139 mmol/L (ref 135–145)
Total Bilirubin: 0.5 mg/dL (ref 0.3–1.2)
Total Protein: 6.9 g/dL (ref 6.5–8.1)

## 2015-12-31 LAB — CBC
HCT: 30.6 % — ABNORMAL LOW (ref 36.0–46.0)
Hemoglobin: 9.5 g/dL — ABNORMAL LOW (ref 12.0–15.0)
MCH: 29.1 pg (ref 26.0–34.0)
MCHC: 31 g/dL (ref 30.0–36.0)
MCV: 93.9 fL (ref 78.0–100.0)
Platelets: 273 10*3/uL (ref 150–400)
RBC: 3.26 MIL/uL — ABNORMAL LOW (ref 3.87–5.11)
RDW: 13.9 % (ref 11.5–15.5)
WBC: 10.4 10*3/uL (ref 4.0–10.5)

## 2015-12-31 LAB — BASIC METABOLIC PANEL
Anion gap: 7 (ref 5–15)
Anion gap: 6 (ref 5–15)
BUN: 29 mg/dL — ABNORMAL HIGH (ref 6–20)
BUN: 26 mg/dL — ABNORMAL HIGH (ref 6–20)
CO2: 14 mmol/L — ABNORMAL LOW (ref 22–32)
CO2: 15 mmol/L — ABNORMAL LOW (ref 22–32)
Calcium: 8.2 mg/dL — ABNORMAL LOW (ref 8.9–10.3)
Calcium: 7.5 mg/dL — ABNORMAL LOW (ref 8.9–10.3)
Chloride: 114 mmol/L — ABNORMAL HIGH (ref 101–111)
Chloride: 115 mmol/L — ABNORMAL HIGH (ref 101–111)
Creatinine, Ser: 1.77 mg/dL — ABNORMAL HIGH (ref 0.44–1.00)
Creatinine, Ser: 1.66 mg/dL — ABNORMAL HIGH (ref 0.44–1.00)
GFR calc Af Amer: 32 mL/min — ABNORMAL LOW (ref >60)
GFR calc Af Amer: 35 mL/min — ABNORMAL LOW (ref >60)
GFR calc non Af Amer: 28 mL/min — ABNORMAL LOW (ref >60)
GFR calc non Af Amer: 30 mL/min — ABNORMAL LOW (ref >60)
Glucose, Bld: 109 mg/dL — ABNORMAL HIGH (ref 65–99)
Glucose, Bld: 122 mg/dL — ABNORMAL HIGH (ref 65–99)
Potassium: 5.2 mmol/L — ABNORMAL HIGH (ref 3.5–5.1)
Potassium: 5.6 mmol/L — ABNORMAL HIGH (ref 3.5–5.1)
Sodium: 135 mmol/L (ref 135–145)
Sodium: 136 mmol/L (ref 135–145)

## 2015-12-31 LAB — POCT I-STAT, CHEM 8
BUN: 29 mg/dL — ABNORMAL HIGH (ref 6–20)
Calcium, Ion: 1.33 mmol/L (ref 1.15–1.40)
Chloride: 118 mmol/L — ABNORMAL HIGH (ref 101–111)
Creatinine, Ser: 2 mg/dL — ABNORMAL HIGH (ref 0.44–1.00)
Glucose, Bld: 88 mg/dL (ref 65–99)
HCT: 29 % — ABNORMAL LOW (ref 36.0–46.0)
Hemoglobin: 9.9 g/dL — ABNORMAL LOW (ref 12.0–15.0)
Potassium: 5 mmol/L (ref 3.5–5.1)
Sodium: 143 mmol/L (ref 135–145)
TCO2: 15 mmol/L (ref 0–100)

## 2015-12-31 LAB — GLUCOSE, CAPILLARY
Glucose-Capillary: 87 mg/dL (ref 65–99)
Glucose-Capillary: 110 mg/dL — ABNORMAL HIGH (ref 65–99)

## 2015-12-31 LAB — HEMOGLOBIN A1C
Hgb A1c MFr Bld: 5.3 % (ref 4.8–5.6)
Mean Plasma Glucose: 105 mg/dL

## 2015-12-31 LAB — TRIGLYCERIDES: Triglycerides: 174 mg/dL — ABNORMAL HIGH (ref <150)

## 2015-12-31 LAB — PREPARE RBC (CROSSMATCH)

## 2015-12-31 LAB — POTASSIUM: Potassium: 5.5 mmol/L — ABNORMAL HIGH (ref 3.5–5.1)

## 2015-12-31 LAB — TROPONIN I: Troponin I: 0.29 ng/mL (ref <0.03)

## 2015-12-31 IMAGING — CR DG CHEST 1V PORT
1 series · 1 of 1 positions shown · non-contrast
Comparison: Radiograph [DATE].

CLINICAL DATA: Acute respiratory failure.

EXAM:
PORTABLE CHEST 1 VIEW

[AP]
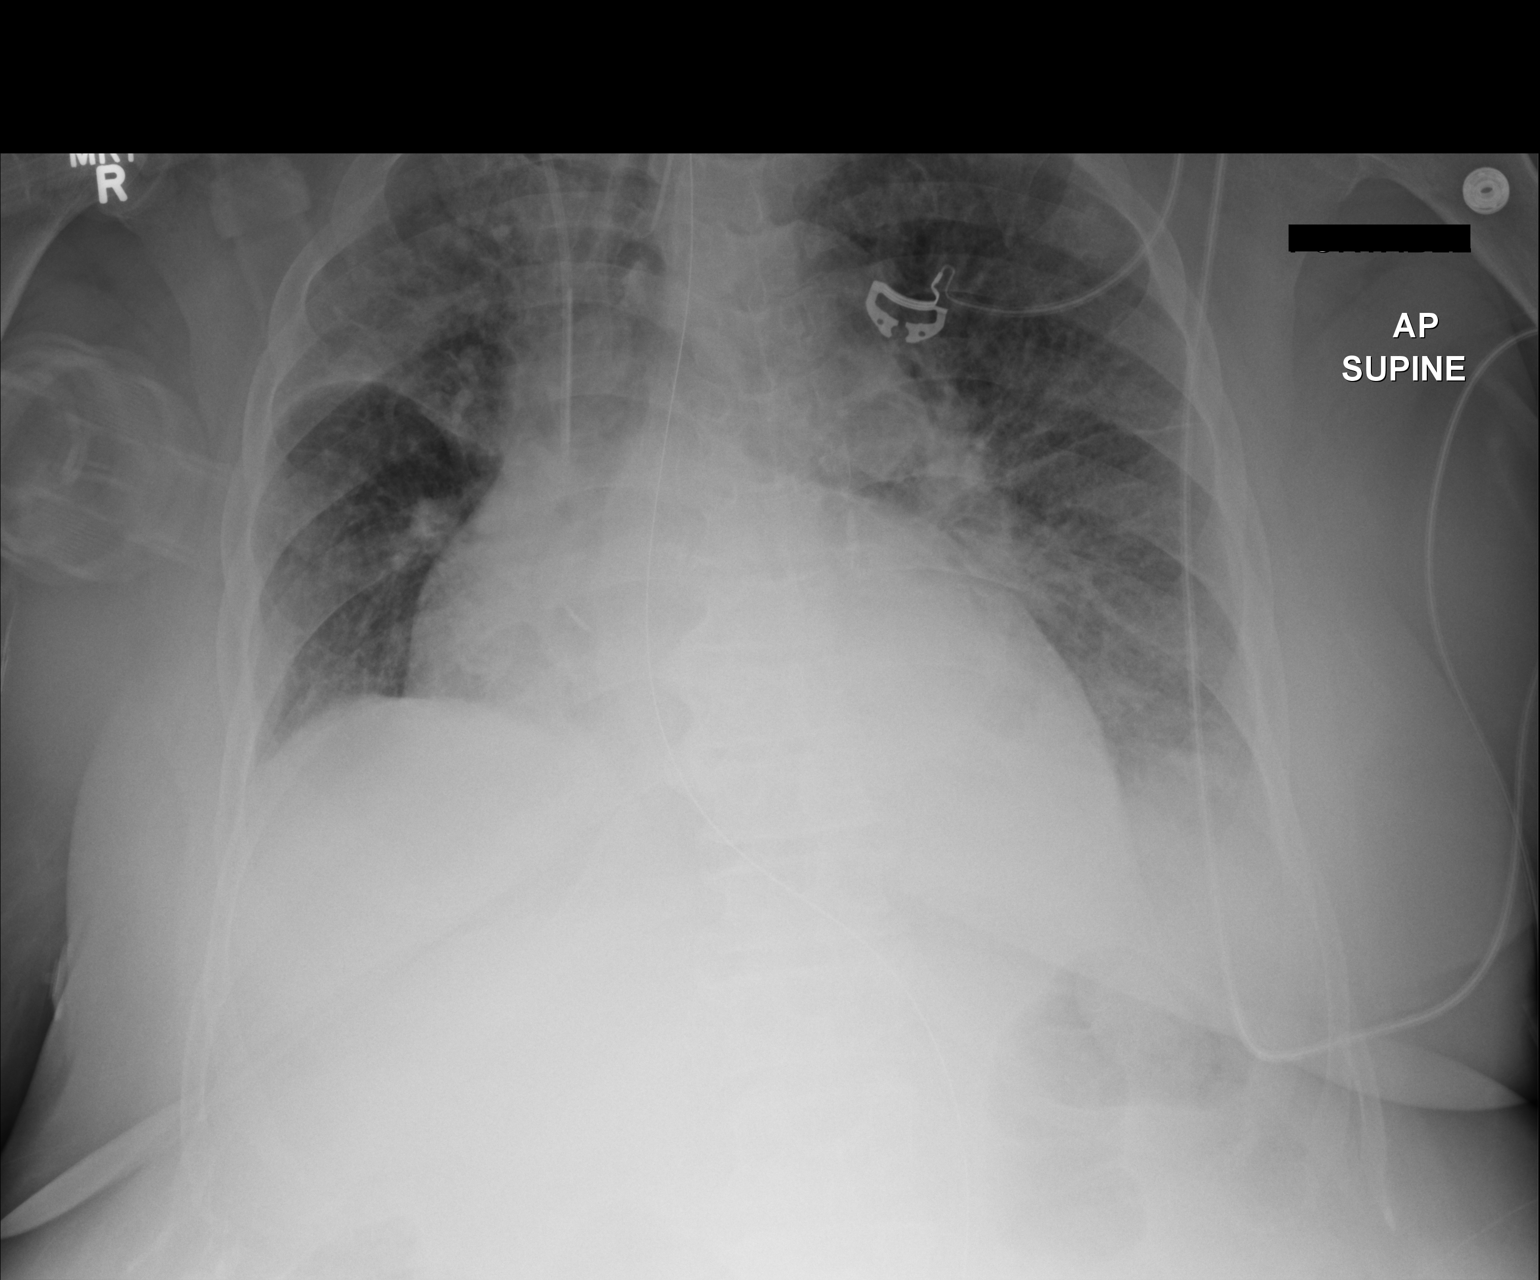

[1 of 1 positions shown; findings below may reference images not displayed]

FINDINGS: Stable cardiomegaly. No pneumothorax is noted. Interval placement of
nasogastric tube seen entering stomach. Endotracheal tube is noted
with distal tip 4 cm above the carina. Interval placement of right
internal jugular catheter with distal tip in expected position of
SVC. Mild bilateral perihilar and basilar interstitial densities are
noted concerning for pulmonary edema. Probable mild left pleural
effusion is noted. Right upper lobe opacity is noted concerning for
possible pneumonia. Bony thorax is unremarkable.
IMPRESSION: Endotracheal and nasogastric tubes in grossly good position. Right
internal jugular catheter is noted with tip in expected position of
SVC. Probable bilateral perihilar and basilar pulmonary edema.
Probable right upper lobe pneumonia or possibly atelectasis.

## 2015-12-31 SURGERY — REPAIR, HERNIA, VENTRAL
Anesthesia: General | Site: Abdomen

## 2015-12-31 MED ORDER — CALCIUM GLUCONATE 10 % IV SOLN: 1.0000 g | Freq: Once | INTRAVENOUS | Status: DC

## 2015-12-31 MED ORDER — SODIUM CHLORIDE 0.9 % IV SOLN
25.0000 ug/h | INTRAVENOUS | Status: DC
  Administered 2015-12-31: 50 ug/h via INTRAVENOUS
  Administered 2016-01-01: 100 ug/h via INTRAVENOUS
  Administered 2016-01-02: 30 ug/h via INTRAVENOUS
  Administered 2016-01-02: 150 ug/h via INTRAVENOUS
  Administered 2016-01-04: 50 ug/h via INTRAVENOUS
  Filled 2015-12-31 (×6): qty 50

## 2015-12-31 MED ORDER — FENTANYL BOLUS VIA INFUSION
25.0000 ug | INTRAVENOUS | Status: DC | PRN
  Filled 2015-12-31: qty 25

## 2015-12-31 MED ORDER — ONDANSETRON HCL 4 MG/2ML IJ SOLN
INTRAMUSCULAR | Status: DC | PRN
  Administered 2015-12-31: 4 mg via INTRAVENOUS

## 2015-12-31 MED ORDER — PROPOFOL 1000 MG/100ML IV EMUL: 0.0000 ug/kg/min | INTRAVENOUS | Status: DC

## 2015-12-31 MED ORDER — SODIUM CHLORIDE 0.9 % IV SOLN
1.0000 g | Freq: Once | INTRAVENOUS | Status: AC
  Administered 2015-12-31: 1 g via INTRAVENOUS
  Filled 2015-12-31: qty 10

## 2015-12-31 MED ORDER — SODIUM CHLORIDE 0.9 % IV SOLN
INTRAVENOUS | Status: DC | PRN
  Administered 2015-12-31 (×2): via INTRAVENOUS

## 2015-12-31 MED ORDER — FENTANYL CITRATE (PF) 100 MCG/2ML IJ SOLN
INTRAMUSCULAR | Status: AC
Start: 2015-12-31 — End: 2015-12-31
  Filled 2015-12-31: qty 2

## 2015-12-31 MED ORDER — CEFAZOLIN SODIUM-DEXTROSE 2-3 GM-% IV SOLR
INTRAVENOUS | Status: DC | PRN
  Administered 2015-12-31: 2 g via INTRAVENOUS

## 2015-12-31 MED ORDER — DEXTROSE 50 % IV SOLN
1.0000 | Freq: Once | INTRAVENOUS | Status: AC
  Administered 2015-12-31: 50 mL via INTRAVENOUS
  Filled 2015-12-31: qty 50

## 2015-12-31 MED ORDER — FENTANYL CITRATE (PF) 100 MCG/2ML IJ SOLN
50.0000 ug | Freq: Once | INTRAMUSCULAR | Status: AC
  Administered 2015-12-31: 50 ug via INTRAVENOUS
  Filled 2015-12-31: qty 2

## 2015-12-31 MED ORDER — HYDRALAZINE HCL 20 MG/ML IJ SOLN
10.0000 mg | INTRAMUSCULAR | Status: DC | PRN
  Administered 2016-01-06 (×2): 20 mg via INTRAVENOUS
  Filled 2015-12-31: qty 1
  Filled 2015-12-31: qty 2
  Filled 2015-12-31: qty 1
  Filled 2015-12-31: qty 2

## 2015-12-31 MED ORDER — FENTANYL CITRATE (PF) 100 MCG/2ML IJ SOLN: 50.0000 ug | INTRAMUSCULAR | Status: DC | PRN

## 2015-12-31 MED ORDER — PROPOFOL 1000 MG/100ML IV EMUL
0.0000 ug/kg/min | INTRAVENOUS | Status: DC
  Administered 2015-12-31 (×2): 50 ug/kg/min via INTRAVENOUS
  Administered 2016-01-01 (×2): 20 ug/kg/min via INTRAVENOUS
  Administered 2016-01-01: 15 ug/kg/min via INTRAVENOUS
  Administered 2016-01-02: 30 ug/kg/min via INTRAVENOUS
  Administered 2016-01-02: 15 ug/kg/min via INTRAVENOUS
  Administered 2016-01-03: 20 ug/kg/min via INTRAVENOUS
  Filled 2015-12-31 (×10): qty 100

## 2015-12-31 MED ORDER — SODIUM CHLORIDE 0.9 % IV BOLUS (SEPSIS)
500.0000 mL | Freq: Once | INTRAVENOUS | Status: AC
  Administered 2015-12-31: 500 mL via INTRAVENOUS

## 2015-12-31 MED ORDER — FENTANYL CITRATE (PF) 100 MCG/2ML IJ SOLN
INTRAMUSCULAR | Status: DC | PRN
  Administered 2015-12-31 (×2): 50 ug via INTRAVENOUS
  Administered 2015-12-31: 100 ug via INTRAVENOUS
  Administered 2015-12-31 (×2): 50 ug via INTRAVENOUS

## 2015-12-31 MED ORDER — CHLORHEXIDINE GLUCONATE 0.12% ORAL RINSE (MEDLINE KIT)
15.0000 mL | Freq: Two times a day (BID) | OROMUCOSAL | Status: DC
  Administered 2015-12-31 – 2016-01-05 (×9): 15 mL via OROMUCOSAL

## 2015-12-31 MED ORDER — INSULIN ASPART 100 UNIT/ML IV SOLN
10.0000 [IU] | Freq: Once | INTRAVENOUS | Status: AC
  Administered 2015-12-31: 10 [IU] via INTRAVENOUS

## 2015-12-31 MED ORDER — DEXTROSE 50 % IV SOLN
50.0000 mL | Freq: Once | INTRAVENOUS | Status: AC
  Administered 2015-12-31: 50 mL via INTRAVENOUS

## 2015-12-31 MED ORDER — SUCCINYLCHOLINE CHLORIDE 20 MG/ML IJ SOLN
INTRAMUSCULAR | Status: DC | PRN
  Administered 2015-12-31: 120 mg via INTRAVENOUS

## 2015-12-31 MED ORDER — CALCIUM GLUCONATE 10 % IV SOLN
1.0000 g | Freq: Once | INTRAVENOUS | Status: AC
  Administered 2015-12-31: 1 g via INTRAVENOUS
  Filled 2015-12-31: qty 10

## 2015-12-31 MED ORDER — ORAL CARE MOUTH RINSE
15.0000 mL | OROMUCOSAL | Status: DC
Start: 2015-12-31 — End: 2016-01-04
  Administered 2015-12-31 – 2016-01-04 (×35): 15 mL via OROMUCOSAL

## 2015-12-31 MED ORDER — LABETALOL HCL 5 MG/ML IV SOLN
5.0000 mg | INTRAVENOUS | Status: DC | PRN
  Administered 2015-12-31 (×2): 5 mg via INTRAVENOUS
  Filled 2015-12-31: qty 4

## 2015-12-31 MED ORDER — SODIUM BICARBONATE 8.4 % IV SOLN
100.0000 meq | Freq: Once | INTRAVENOUS | Status: AC
  Administered 2016-01-01: 100 meq via INTRAVENOUS
  Filled 2015-12-31: qty 100

## 2015-12-31 MED ORDER — ALBUTEROL SULFATE (2.5 MG/3ML) 0.083% IN NEBU
2.5000 mg | INHALATION_SOLUTION | Freq: Once | RESPIRATORY_TRACT | Status: AC
  Administered 2015-12-31: 2.5 mg via RESPIRATORY_TRACT
  Filled 2015-12-31: qty 3

## 2015-12-31 MED ORDER — PROPOFOL 500 MG/50ML IV EMUL
INTRAVENOUS | Status: DC | PRN
  Administered 2015-12-31: 50 ug/kg/min via INTRAVENOUS

## 2015-12-31 MED ORDER — FENTANYL CITRATE (PF) 100 MCG/2ML IJ SOLN
INTRAMUSCULAR | Status: AC
  Filled 2015-12-31: qty 2

## 2015-12-31 MED ORDER — HYDROMORPHONE HCL 1 MG/ML IJ SOLN
0.2500 mg | INTRAMUSCULAR | Status: DC | PRN
  Administered 2016-01-08: 0.5 mg via INTRAVENOUS
  Filled 2015-12-31: qty 1

## 2015-12-31 MED ORDER — PROPOFOL 10 MG/ML IV BOLUS
INTRAVENOUS | Status: AC
  Filled 2015-12-31: qty 20

## 2015-12-31 MED ORDER — IPRATROPIUM-ALBUTEROL 0.5-2.5 (3) MG/3ML IN SOLN
3.0000 mL | RESPIRATORY_TRACT | Status: DC
  Administered 2015-12-31 – 2016-01-02 (×10): 3 mL via RESPIRATORY_TRACT
  Filled 2015-12-31 (×9): qty 3

## 2015-12-31 MED ORDER — DEXTROSE 50 % IV SOLN
INTRAVENOUS | Status: AC
  Filled 2015-12-31: qty 50

## 2015-12-31 MED ORDER — FENTANYL CITRATE (PF) 100 MCG/2ML IJ SOLN
50.0000 ug | INTRAMUSCULAR | Status: DC | PRN
  Administered 2015-12-31 – 2016-01-08 (×7): 50 ug via INTRAVENOUS
  Filled 2015-12-31 (×5): qty 2

## 2015-12-31 MED ORDER — METOPROLOL TARTRATE 5 MG/5ML IV SOLN
5.0000 mg | Freq: Once | INTRAVENOUS | Status: AC
  Administered 2015-12-31: 5 mg via INTRAVENOUS

## 2015-12-31 MED ORDER — PROPOFOL 10 MG/ML IV BOLUS
INTRAVENOUS | Status: DC | PRN
  Administered 2015-12-31: 100 mg via INTRAVENOUS

## 2015-12-31 MED ORDER — SODIUM BICARBONATE 8.4 % IV SOLN
INTRAVENOUS | Status: DC
  Filled 2015-12-31 (×3): qty 150

## 2015-12-31 MED ORDER — ORAL CARE MOUTH RINSE
15.0000 mL | Freq: Four times a day (QID) | OROMUCOSAL | Status: DC
  Administered 2015-12-31: 15 mL via OROMUCOSAL

## 2015-12-31 MED ORDER — HEPARIN SODIUM (PORCINE) 5000 UNIT/ML IJ SOLN
5000.0000 [IU] | Freq: Three times a day (TID) | INTRAMUSCULAR | Status: DC
  Administered 2015-12-31 – 2016-01-04 (×13): 5000 [IU] via SUBCUTANEOUS
  Filled 2015-12-31 (×13): qty 1

## 2015-12-31 MED ORDER — PHENYLEPHRINE HCL 10 MG/ML IJ SOLN
INTRAMUSCULAR | Status: DC | PRN
  Administered 2015-12-31: 80 ug via INTRAVENOUS
  Administered 2015-12-31 (×2): 40 ug via INTRAVENOUS

## 2015-12-31 MED ORDER — DILTIAZEM HCL 100 MG IV SOLR: INTRAVENOUS | Status: DC | PRN

## 2015-12-31 MED ORDER — PROPOFOL 1000 MG/100ML IV EMUL
INTRAVENOUS | Status: AC
  Filled 2015-12-31: qty 100

## 2015-12-31 MED ORDER — METOPROLOL TARTRATE 5 MG/5ML IV SOLN
INTRAVENOUS | Status: AC
  Filled 2015-12-31: qty 5

## 2015-12-31 MED ORDER — DILTIAZEM HCL 100 MG IV SOLR
INTRAVENOUS | Status: DC | PRN
  Administered 2015-12-31: 5 mg/h via INTRAVENOUS

## 2015-12-31 MED ORDER — FAMOTIDINE IN NACL 20-0.9 MG/50ML-% IV SOLN
20.0000 mg | Freq: Two times a day (BID) | INTRAVENOUS | Status: DC
  Administered 2015-12-31 – 2016-01-02 (×5): 20 mg via INTRAVENOUS
  Filled 2015-12-31 (×5): qty 50

## 2015-12-31 MED ORDER — DILTIAZEM HCL-DEXTROSE 100-5 MG/100ML-% IV SOLN (PREMIX)
5.0000 mg/h | INTRAVENOUS | Status: DC
  Administered 2015-12-31: 10 mg/h via INTRAVENOUS
  Administered 2015-12-31 – 2016-01-01 (×3): 15 mg/h via INTRAVENOUS
  Filled 2015-12-31 (×5): qty 100

## 2015-12-31 MED ORDER — MIDAZOLAM HCL 2 MG/2ML IJ SOLN
INTRAMUSCULAR | Status: AC
  Filled 2015-12-31: qty 2

## 2015-12-31 MED ORDER — ROCURONIUM BROMIDE 100 MG/10ML IV SOLN
INTRAVENOUS | Status: DC | PRN
  Administered 2015-12-31: 60 mg via INTRAVENOUS

## 2015-12-31 MED ORDER — DILTIAZEM LOAD VIA INFUSION: 20.0000 mg | Freq: Once | INTRAVENOUS | Status: DC

## 2015-12-31 MED ORDER — 0.9 % SODIUM CHLORIDE (POUR BTL) OPTIME
TOPICAL | Status: DC | PRN
  Administered 2015-12-31: 1000 mL

## 2015-12-31 MED ORDER — CHLORHEXIDINE GLUCONATE 0.12% ORAL RINSE (MEDLINE KIT)
15.0000 mL | Freq: Two times a day (BID) | OROMUCOSAL | Status: DC
  Administered 2015-12-31: 15 mL via OROMUCOSAL

## 2015-12-31 MED ORDER — INSULIN ASPART 100 UNIT/ML ~~LOC~~ SOLN
0.0000 [IU] | SUBCUTANEOUS | Status: DC
  Administered 2016-01-01 (×3): 2 [IU] via SUBCUTANEOUS
  Administered 2016-01-01: 3 [IU] via SUBCUTANEOUS
  Administered 2016-01-06 – 2016-01-08 (×3): 2 [IU] via SUBCUTANEOUS
  Administered 2016-01-08: 3 [IU] via SUBCUTANEOUS

## 2015-12-31 SURGICAL SUPPLY — 39 items
BLADE SURG ROTATE 9660 (MISCELLANEOUS) IMPLANT
CANISTER SUCTION 2500CC (MISCELLANEOUS) ×3 IMPLANT
CHLORAPREP W/TINT 26ML (MISCELLANEOUS) ×3 IMPLANT
COVER SURGICAL LIGHT HANDLE (MISCELLANEOUS) ×3 IMPLANT
DRAPE LAPAROSCOPIC ABDOMINAL (DRAPES) ×3 IMPLANT
DRAPE UTILITY XL STRL (DRAPES) ×6 IMPLANT
ELECT CAUTERY BLADE 6.4 (BLADE) ×4 IMPLANT
ELECT REM PT RETURN 9FT ADLT (ELECTROSURGICAL) ×3
ELECTRODE REM PT RTRN 9FT ADLT (ELECTROSURGICAL) ×2 IMPLANT
GAUZE SPONGE 4X4 12PLY STRL (GAUZE/BANDAGES/DRESSINGS) ×1 IMPLANT
GLOVE BIO SURGEON STRL SZ8 (GLOVE) ×3 IMPLANT
GLOVE BIOGEL PI IND STRL 8 (GLOVE) ×2 IMPLANT
GLOVE BIOGEL PI INDICATOR 8 (GLOVE) ×1
GOWN STRL REUS W/ TWL LRG LVL3 (GOWN DISPOSABLE) ×4 IMPLANT
GOWN STRL REUS W/ TWL XL LVL3 (GOWN DISPOSABLE) ×2 IMPLANT
GOWN STRL REUS W/TWL LRG LVL3 (GOWN DISPOSABLE) ×6
GOWN STRL REUS W/TWL XL LVL3 (GOWN DISPOSABLE) ×3
KIT BASIN OR (CUSTOM PROCEDURE TRAY) ×3 IMPLANT
KIT ROOM TURNOVER OR (KITS) ×3 IMPLANT
LIGASURE IMPACT 36 18CM CVD LR (INSTRUMENTS) ×1 IMPLANT
LIQUID BAND (GAUZE/BANDAGES/DRESSINGS) IMPLANT
NEEDLE 22X1 1/2 (OR ONLY) (NEEDLE) ×3 IMPLANT
NS IRRIG 1000ML POUR BTL (IV SOLUTION) ×3 IMPLANT
PACK GENERAL/GYN (CUSTOM PROCEDURE TRAY) ×3 IMPLANT
PAD ARMBOARD 7.5X6 YLW CONV (MISCELLANEOUS) ×6 IMPLANT
STAPLER VISISTAT 35W (STAPLE) ×1 IMPLANT
SUT MNCRL AB 4-0 PS2 18 (SUTURE) ×3 IMPLANT
SUT NOVA 1 T20/GS 25DT (SUTURE) ×3 IMPLANT
SUT PROLENE 0 CT 2 (SUTURE) ×1 IMPLANT
SUT SILK 2 0 TIES 10X30 (SUTURE) ×1 IMPLANT
SUT SILK 3 0 TIES 10X30 (SUTURE) ×1 IMPLANT
SUT VIC AB 3-0 SH 27 (SUTURE) ×3
SUT VIC AB 3-0 SH 27X BRD (SUTURE) ×2 IMPLANT
SYR CONTROL 10ML LL (SYRINGE) ×3 IMPLANT
TAPE CLOTH SURG 4X10 WHT LF (GAUZE/BANDAGES/DRESSINGS) ×1 IMPLANT
TOWEL OR 17X24 6PK STRL BLUE (TOWEL DISPOSABLE) ×3 IMPLANT
TOWEL OR 17X26 10 PK STRL BLUE (TOWEL DISPOSABLE) ×3 IMPLANT
TRAY FOLEY CATH 14FRSI W/METER (CATHETERS) IMPLANT
WATER STERILE IRR 1000ML POUR (IV SOLUTION) IMPLANT

## 2015-12-31 NOTE — Progress Notes (Signed)
Notified dr Grandville Silos of lab results, new orders rec'd.

## 2015-12-31 NOTE — Progress Notes (Signed)
Patient in a- fib RVR. CRNA and anesthesia at bedside

## 2015-12-31 NOTE — Progress Notes (Signed)
Day of Surgery  Subjective: Pain at hernia  Objective: Vital signs in last 24 hours: Temp:  [98.1 F (36.7 C)-98.7 F (37.1 C)] 98.6 F (37 C) (09/30 0700) Pulse Rate:  [101-122] 110 (09/30 0325) Resp:  [12-25] 17 (09/30 0700) BP: (123-159)/(68-96) 123/69 (09/30 0700) SpO2:  [93 %-99 %] 99 % (09/30 0325) Weight:  [101 kg (222 lb 10.6 oz)-101.8 kg (224 lb 6.9 oz)] 101.8 kg (224 lb 6.9 oz) (09/30 0325) Last BM Date: 12/30/15  Intake/Output from previous day: 09/29 0701 - 09/30 0700 In: 2368.8 [I.V.:1368.8; IV Piggyback:1000] Out: 600 [Urine:600] Intake/Output this shift: No intake/output data recorded.  General appearance: cooperative Resp: clear to auscultation bilaterally Cardio: tachy 130s GI: large incarcerated ventral hernia with tenderness, no skin changes  Lab Results:   Recent Labs  12/29/15 2154 12/31/15 0210  WBC 13.7* 10.4  HGB 11.4* 9.5*  HCT 36.9 30.6*  PLT 325 273   BMET  Recent Labs  12/30/15 2057 12/31/15 0210  NA 137 139  K 5.8* 5.7*  CL 115* 119*  CO2 17* 16*  GLUCOSE 115* 123*  BUN 32* 31*  CREATININE 1.92* 1.96*  CALCIUM 8.6* 8.2*   PT/INR  Recent Labs  12/30/15 1340  LABPROT 13.9  INR 1.06   ABG No results for input(s): PHART, HCO3 in the last 72 hours.  Invalid input(s): PCO2, PO2  Studies/Results: Ct Abdomen Pelvis Wo Contrast  Result Date: 12/30/2015 CLINICAL DATA:  70 year old female with abdominal pain. Ventral hernia. EXAM: CT ABDOMEN AND PELVIS WITHOUT CONTRAST TECHNIQUE: Multidetector CT imaging of the abdomen and pelvis was performed following the standard protocol without IV contrast. COMPARISON:  Abdominal CT dated 05/31/2011 and ultrasound dated 06/02/2011 FINDINGS: Lower chest: The visualized lung bases are clear. There is coronary vascular calcification. Partially visualized pericardial effusion. No intra-abdominal free air.  Small free fluid within the pelvis. Hepatobiliary: There multiple stones within the  gallbladder. No pericholecystic fluid. The liver is unremarkable. No intrahepatic biliary ductal dilatation Pancreas: Unremarkable. No pancreatic ductal dilatation or surrounding inflammatory changes. Spleen: Normal in size without focal abnormality. Adrenals/Urinary Tract: Bilateral adrenal thickening/ hyperplasia versus adenoma similar to prior study. The kidneys are mildly atrophic. There is no hydronephrosis or nephrolithiasis on either side. The visualized ureters and urinary bladder appear unremarkable. Stomach/Bowel: There is a small hiatal hernia with gastroesophageal reflux. There is a 5.2 cm supraumbilical ventral defect with herniation of portion of the transverse colon and multiple loops of small bowel. There is focal compression and narrowing of a loop of bowel as it exits the hernia (series 201, image 36) with dilatation of small bowel loop proximal to the exit point within the hernia sac as well as loops of small bowel proximal to the hernia. Small amount of fluid is noted within the hernia sac. There is extensive colonic diverticulosis without active inflammatory changes. Normal appendix. Vascular/Lymphatic: Advanced aortoiliac atherosclerotic disease. Evaluation of the vasculature is limited in the absence of contrast. No portal venous gas identified. There is no adenopathy. Reproductive: The uterus and the ovaries are grossly unremarkable. Other: None Musculoskeletal: There is multilevel degenerative changes of the spine with facet hypertrophy and osteophyte formation. No acute fracture. IMPRESSION: Large supraumbilical ventral hernia containing a segment of the transverse colon and small bowel. There is compression of the small bowel at the neck of the hernia with small bowel obstruction. Clinical correlation and surgical consult is advised. No free air. Colonic diverticulosis. Cholelithiasis. Electronically Signed   By: Laren Everts.D.  On: 12/30/2015 03:43   Dg Chest Port 1  View  Result Date: 12/30/2015 CLINICAL DATA:  Shortness of breath.  Abdominal pain. EXAM: PORTABLE CHEST 1 VIEW COMPARISON:  One-view chest x-ray 06/07/2011 FINDINGS: The heart is mildly enlarged. Mild edema is present. Small effusions are suspected. Bibasilar airspace disease likely reflects atelectasis. Lung apices are clear. The visualized soft tissues and bony thorax are unremarkable. IMPRESSION: 1. Cardiomegaly with mild edema and small effusions suggesting congestive heart failure. 2. Mild the bibasilar airspace disease likely reflects atelectasis. Electronically Signed   By: San Morelle M.D.   On: 12/30/2015 08:45    Anti-infectives: Anti-infectives    None      Assessment/Plan: Incarcerated VH - for repair and possible bowel resection this AM. Procedure, risks, and benefits discussed with her and her daughter. She agrees. SVT - lopressor now Hyperkalemia - give additional D50/insulin now as well as Ca gluconate   LOS: 1 day    Jamas Jaquay E 12/31/2015

## 2015-12-31 NOTE — Progress Notes (Signed)
[facility] Progress Note Patient Name: Stephanie Griffith DOB: 1945-12-10 MRN: [id number]   Date of Service  12/31/2015  HPI/Events of Note  K-5.6  eICU Interventions  D50/insulin, calcium gluconate. Repeat K at 10 pm.     Intervention Category Major Interventions: Electrolyte abnormality - evaluation and management  Dimas Chyle 12/31/2015, 5:18 PM

## 2015-12-31 NOTE — Progress Notes (Signed)
   12/31/15 0844  Vitals  BP 101/73  MAP (mmHg) 80  ECG Heart Rate (!) 136  Resp (!) 22  lopressor 5mg  iv given

## 2015-12-31 NOTE — Op Note (Signed)
12/30/2015 - 12/31/2015  12:49 PM  PATIENT:  Stephanie Griffith  70 y.o. female  PRE-OPERATIVE DIAGNOSIS:  incarcerated ventral hernia, small bowel obstruction  POST-OPERATIVE DIAGNOSIS:  incarcerated ventral hernia, small bowel obstruction  PROCEDURE:  Procedure(s): REPAIR INCARCERATED VENTRAL HERNIA Partial OMENTECTOMY  SURGEON:  Surgeon(s): Georganna Skeans, MD  ASSISTANTS: none   ANESTHESIA:   general  EBL:  Total I/O In: 4403 [I.V.:1375; IV Piggyback:110] Out: 400 [Urine:300; Blood:100]  BLOOD ADMINISTERED:none  DRAINS: none   SPECIMEN:  Excision  DISPOSITION OF SPECIMEN:  PATHOLOGY  COUNTS:  YES  DICTATION: .Dragon Dictation Findings: Obstructed but viable small bowel in hernia, chronically incarcerated but viable colon in hernia  Procedure in detail: Ms. Cogburn is brought emergently for repair of incarcerated ventral hernia causing small bowel obstruction. She received intravenous antibiotics. Informed consent was obtained. Arterial line and central line were placed in preop holding by anesthesia. She was treated for atrial fibrillation with rapid ventricular response. Her hyperkalemia was treated. She was brought to the operating room and general endotracheal anesthesia was administered by the anesthesia staff. Foley catheter was placed by nursing. Her abdomen was prepped and draped in sterile fashion. We did a time out procedure. I did upper midline incision over the hernia. Subcutaneous tissues were carefully dissected down revealing the hernia sac. This was circumferentially dissected from subcutaneous tissues down to the fascia. The hernia sac was entered. The fascia and carefully freed up circumferentially along that edge protecting the bowel. There was a small segment of small bowel that was incarcerated at the inferior portion of the fascial defect. This was reduced back into the abdomen. It was a bit purple but appeared viable. Plan was made to take another look at that.  The hernia sac was dissected off of the transverse colon and omentum where it had been chronically incarcerated. This portion of omentum that was questionable in appearance. This was removed using a LigaSure and clamps and silk ties as needed. It was sent to pathology. The remainder the omentum was viable and the colon was completely viable. The fascial defect was opened slightly superiorly and inferiorly along the midline and this allowed the colon to be reduced. The small bowel was then reinspected. The area that was obstructed was completely pink and viable. Colon was reinspected and remained viable. Hemostasis was ensured. The area was irrigated. Due to the nature of the omentum required resection, no mesh was used in the hernia repair. The defect was only about 5 cm across. The fascia was closed along the midline with interrupted #1 Novafil sutures. It came together nicely. Subcutaneous tissues were irrigated and the skin was closed with staples. All counts were correct. She tolerated procedure well without apparent complications and was taken recovery in stable condition. PATIENT DISPOSITION:  ICU - intubated and critically ill.   Delay start of Pharmacological VTE agent (>24hrs) due to surgical blood loss or risk of bleeding:  no  Georganna Skeans, MD, MPH, FACS Pager: [phone]  9/30/201712:49 PM

## 2015-12-31 NOTE — Anesthesia Preprocedure Evaluation (Addendum)
Anesthesia Evaluation  Patient identified by MRN, date of birth, ID band Patient awake    Reviewed: Allergy & Precautions, H&P , NPO status , Patient's Chart, lab work & pertinent test results, reviewed documented beta blocker date and time   Airway Mallampati: II  TM Distance: >3 FB     Dental no notable dental hx. (+) Teeth Intact, Dental Advisory Given   Pulmonary neg pulmonary ROS, former smoker,    Pulmonary exam normal breath sounds clear to auscultation       Cardiovascular hypertension, Pt. on medications and Pt. on home beta blockers + Peripheral Vascular Disease   Rhythm:Regular Rate:Normal     Neuro/Psych negative neurological ROS  negative psych ROS   GI/Hepatic negative GI ROS, Neg liver ROS,   Endo/Other  negative endocrine ROS  Renal/GU Renal InsufficiencyRenal disease  negative genitourinary   Musculoskeletal   Abdominal   Peds  Hematology negative hematology ROS (+) anemia ,   Anesthesia Other Findings   Reproductive/Obstetrics negative OB ROS                            Anesthesia Physical Anesthesia Plan  ASA: III  Anesthesia Plan: General   Post-op Pain Management:    Induction: Intravenous, Rapid sequence and Cricoid pressure planned  Airway Management Planned: Oral ETT  Additional Equipment: Arterial line and CVP  Intra-op Plan:   Post-operative Plan: Extubation in OR and Possible Post-op intubation/ventilation  Informed Consent: I have reviewed the patients History and Physical, chart, labs and discussed the procedure including the risks, benefits and alternatives for the proposed anesthesia with the patient or authorized representative who has indicated his/her understanding and acceptance.   Dental advisory given  Plan Discussed with: CRNA  Anesthesia Plan Comments:        Anesthesia Quick Evaluation

## 2015-12-31 NOTE — Progress Notes (Signed)
Dr. Lyndel Safe notified via elink blood gas.  Orders received to increase rate to 20 on vent-done and to get BMP now.

## 2015-12-31 NOTE — Anesthesia Postprocedure Evaluation (Signed)
Anesthesia Post Note  Patient: Stephanie Griffith  Procedure(s) Performed: Procedure(s) (LRB): REPAIR INCARCERATED VENTRAL HERNIA, POSSIBLE BOWEL RESECTION (N/A) Partial OMENTECTOMY  Patient location during evaluation: SICU Anesthesia Type: General Level of consciousness: sedated Pain management: pain level controlled Vital Signs Assessment: post-procedure vital signs reviewed and stable Respiratory status: patient remains intubated per anesthesia plan Cardiovascular status: stable Anesthetic complications: no    Last Vitals:  Vitals:   12/31/15 0844 12/31/15 0903  BP: 101/73   Pulse:    Resp: (!) 22 (!) 26  Temp:      Last Pain:  Vitals:   12/31/15 0700  TempSrc: Oral  PainSc:                  Stephanie Griffith,Stephanie Griffith

## 2015-12-31 NOTE — Progress Notes (Signed)
Patient ID: Stephanie Griffith, female   DOB: 24-Feb-1946, 70 y.o.   MRN: [id number] I spoke with one of her daughters at the bedside and updated her on the operative findings and plan of care. Appreciate CCM assist. Georganna Skeans, MD, MPH, FACS Trauma: [phone] General Surgery: [phone]

## 2015-12-31 NOTE — Consult Note (Signed)
PULMONARY / CRITICAL CARE MEDICINE   Name: Stephanie Griffith MRN: [id number] DOB: Aug 15, 1945    ADMISSION DATE:  12/30/2015 CONSULTATION DATE:  12/31/2015  REFERRING MD:  Grandville Silos, MD  CHIEF COMPLAINT:  Postoperative vent management  HISTORY OF PRESENT ILLNESS:   70 year old ex-smoker with long-standing ventral hernia admitted with nausea and vomiting and abdominal pain. She  underwent repair of incarcerated ventral hernia that was causing small bowel obstruction. Preop showed atrial fibrillation requiring Cardizem drip and was hypertensive postoperatively, left on the ventilator, hence we're consulted. Other complicating features were AK I with hyperkalemia-for which she was treated with Kayexalate calcium and D50/insulin and hyperglycemia. She has no prior diagnosis of .  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  PAST SURGICAL HISTORY: She  has a past surgical history that includes Tubal ligation; Femoral-popliteal Bypass Graft (06/06/2011); Amputation (06/06/2011); Amputation (06/12/2011); and lower extremity angiogram (N/A, 06/04/2011).  No Known Allergies  No current facility-administered medications on file prior to encounter.    Current Outpatient Prescriptions on File Prior to Encounter  Medication Sig  . amLODipine (NORVASC) 5 MG tablet Take 10 mg by mouth daily.   Marland Kitchen aspirin 81 MG EC tablet Take 81 mg by mouth daily.    Marland Kitchen atorvastatin (LIPITOR) 40 MG tablet Take 40 mg by mouth daily.  . carvedilol (COREG) 25 MG tablet Take 25 mg by mouth 2 (two) times daily with a meal.  . famotidine (PEPCID) 20 MG tablet Take  20 mg by mouth 2 (two) times daily.  Marland Kitchen gabapentin (NEURONTIN) 100 MG capsule Take 100 mg by mouth 2 (two) times daily.   Marland Kitchen olmesartan-hydrochlorothiazide (BENICAR HCT) 40-25 MG per tablet Take 1 tablet by mouth daily.    FAMILY HISTORY:  Her indicated that her mother is deceased. She indicated that her father is deceased. She indicated that the status of her brother is unknown.    SOCIAL HISTORY: She  reports that she quit smoking about 4 years ago. She has a 100.00 pack-year smoking history. She has never used smokeless tobacco. She reports that she does not drink alcohol or use drugs.  REVIEW OF SYSTEMS:  Unable to obtain since intubated and sedated   SUBJECTIVE:    VITAL SIGNS: BP 101/73   Pulse (!) 110   Temp 98.6 F (37 C) (Oral)   Resp (!) 26   Ht 5\' 8"  (1.727 m)   Wt 224 lb 6.9 oz (101.8 kg)   SpO2 96%   BMI 34.12 kg/m   HEMODYNAMICS:    VENTILATOR SETTINGS:    INTAKE / OUTPUT: I/O last 3 completed shifts: In: 3368.8 [I.V.:1368.8; IV [id number]:5366] Out: 600 [Urine:600]  PHYSICAL EXAMINATION: Gen. Elderly, obese, in no distress ENT - no lesions, no post nasal drip, ETT oral  Neck: No JVD, no thyromegaly, no carotid bruits Lungs: no use of accessory muscles, no dullness to percussion, decreased without rales or rhonchi  Cardiovascular: Rhythm regular, heart sounds  normal, no murmurs or gallops, no peripheral edema Abdomen: Midline incision with bandage ,soft and non-tender, no hepatosplenomegaly, BS normal. Musculoskeletal: No deformities, no cyanosis or clubbing Neuro: Sedated on propofol, non focal   LABS:  BMET  Recent Labs Lab 12/30/15 2057 12/31/15 0210 12/31/15 0801 12/31/15 0921  NA 137 139 135 143  K 5.8* 5.7* 5.2* 5.0  CL 115* 119* 114* 118*  CO2 17* 16* 14*  --   BUN 32* 31* 29* 29*  CREATININE 1.92* 1.96* 1.77* 2.00*  GLUCOSE 115* 123* 109* 88    Electrolytes  Recent Labs Lab 12/30/15 2057 12/31/15 0210 12/31/15 0801   CALCIUM 8.6* 8.2* 8.2*    CBC  Recent Labs Lab 12/29/15 2154 12/31/15 0210 12/31/15 0921  WBC 13.7* 10.4  --   HGB 11.4* 9.5* 9.9*  HCT 36.9 30.6* 29.0*  PLT 325 273  --     Coag's  Recent Labs Lab 12/30/15 1340  INR 1.06    Sepsis Markers  Recent Labs Lab 12/30/15 0114 12/30/15 1340  LATICACIDVEN 1.92* 2.5*    ABG No results for input(s): PHART, PCO2ART, PO2ART in the last 168 hours.  Liver Enzymes  Recent Labs Lab 12/29/15 2154 12/31/15 0210  AST 23 24  ALT 15 13*  ALKPHOS 178* 132*  BILITOT 0.4 0.5  ALBUMIN 4.0 3.0*    Cardiac Enzymes No results for input(s): TROPONINI, PROBNP in the last 168 hours.  Glucose  Recent Labs Lab 12/30/15 1209 12/30/15 1655 12/30/15 2118 12/31/15 0803  GLUCAP 90 117* 97 87    Imaging No results found.   STUDIES:  CT abdomen 9/29  Large supraumbilical ventral hernia containing a segment of the transverse colon and small bowel. There is compression of the small bowel at the neck of the hernia with small bowel obstruction  CULTURES:   ANTIBIOTICS:   SIGNIFICANT EVENTS: 9/30 repair of incarc hernia  LINES/TUBES: ETT 9/30 >> A line 9/30 >> CVL 9/30 >>   DISCUSSION: She seems to have converted to normal sinus rhythm and hyperkalemia has improved. Hopefully we should be able to wean to extubate in 24 hours  ASSESSMENT / PLAN:  PULMONARY A: Postoperative respiratory failure ? Underlying COPD, heavy ex-smoker P:   PRVC vent settings SBTs as tolerated, once cardiac issues controlled  CARDIOVASCULAR A:  Hypertension New onset atrial fibrillation with RVR  P:  Converted to normal sinus rhythm now Check troponin and EKG  RENAL A:   AKI -on CK D , baseline creatinine 1.4 range Hyperkalemia-resolved P:   Hold ARB  GASTROINTESTINAL A:   SBO , incarcerated hernia, postop repair P:   Nothing by mouth Chief feedings to start per surgery once bowel function improves  HEMATOLOGIC A:    No issues P:  Monitor hemoglobin postop  INFECTIOUS A:   No evidence of infection P:   Hold off antibiotics  ENDOCRINE A:   Transient hyperglycemia, not a diabetic   P:   CBG every 4  NEUROLOGIC A:   Sedation for ventilation Pain P:   RASS goal: 0 Propofol, intermittent Fent per PAD protocol   FAMILY  - Updates: 2 daughters at bedside , they work at [employer] family meet or Palliative Care meeting due by:  NA   The patient is critically ill with multiple organ systems failure and requires high complexity decision making for assessment and support, frequent evaluation and titration of therapies, application of advanced monitoring technologies and extensive interpretation of multiple databases. Critical Care Time devoted to patient care services described in this note independent of APP time is 50 minutes.   Kara Mead MD. [place] Flood. [facility] Pager [phone] If no response call [phone]  12/31/2015, 2:00 PM

## 2015-12-31 NOTE — Progress Notes (Signed)
  [place] TEAM 1 - Stepdown/ICU TEAM  Attempted to see pt earlier this morning, but she was being transported the to OR for emergent bowel surgery.    Afib w/ RVR noted and orders placed for cardizem gtt (EF confirmed normal via most recent TTE in system).  Bicarb gtt initiated for metabolic acidosis and hyperkalemia in the setting of acute renal failure.    Pt is being transferred to the ICU post-op, w/ further medical care to be provided by PCCM.    [place] signing off.  Cherene Altes, MD [facility] Office  [phone] Pager - Text Page per Amion as per below:  On-Call/Text Page:      [email or web address]      password [email or web address]  If 7PM-7AM, please contact night-coverage [email or web address] Password [email or web address] 12/31/2015, 1:27 PM

## 2015-12-31 NOTE — Progress Notes (Signed)
Dr Grandville Silos at bedside, pt in svt 150's, new orders rec'd lopressor given and 12 lead ekg to be done.

## 2015-12-31 NOTE — Progress Notes (Signed)
Notified daugther monique of her belongings (ring, one earring) in denture cup in belonging bag with w/c.

## 2015-12-31 NOTE — Transfer of Care (Signed)
Immediate Anesthesia Transfer of Care Note  Patient: FABIANA DROMGOOLE  Procedure(s) Performed: Procedure(s): REPAIR INCARCERATED VENTRAL HERNIA, POSSIBLE BOWEL RESECTION (N/A) Partial OMENTECTOMY  Patient Location: SICU  Anesthesia Type:GA combined with regional for post-op pain  Level of Consciousness: Patient remains intubated per anesthesia plan  Airway & Oxygen Therapy: Patient remains intubated per anesthesia plan  Post-op Assessment: Report given to RN, Post -op Vital signs reviewed and stable and Post -op Vital signs reviewed and unstable, Anesthesiologist notified  Post vital signs: Reviewed and stable  Last Vitals:  Vitals:   12/31/15 0844 12/31/15 0903  BP: 101/73   Pulse:    Resp: (!) 22 (!) 26  Temp:      Last Pain:  Vitals:   12/31/15 0700  TempSrc: Oral  PainSc:       Patients Stated Pain Goal: 2 (64/15/83 0940)  Complications: No apparent anesthesia complications

## 2015-12-31 NOTE — Anesthesia Procedure Notes (Addendum)
Central Venous Catheter Insertion Performed by: anesthesiologist 12/31/2015 9:40 AM Patient location: Pre-op. Preanesthetic checklist: patient identified, IV checked, site marked, risks and benefits discussed, surgical consent, monitors and equipment checked, pre-op evaluation, timeout performed and anesthesia consent Position: Trendelenburg Lidocaine 1% used for infiltration Landmarks identified and Seldinger technique used Catheter size: 8 Fr Central line was placed.Double lumen Procedure performed using ultrasound guided technique. Attempts: 1 Following insertion, dressing applied, line sutured and Biopatch. Post procedure assessment: blood return through all ports. Patient tolerated the procedure well with no immediate complications.

## 2015-12-31 NOTE — Progress Notes (Signed)
Notified dr Grandville Silos and dr Thereasa Solo regarding HR, afib rvr waiting on orders, okay to go to OR.

## 2015-12-31 NOTE — Progress Notes (Signed)
[facility] Progress Note Patient Name: Stephanie Griffith DOB: 1945-09-21 MRN: [id number]   Date of Service  12/31/2015  HPI/Events of Note  ABG on PRVC 16/520/50% PEEP 5 : 7.18/37/90's. MV~8.5  eICU Interventions  Increase RR 20. Check BMP.     Intervention Category Major Interventions: Acid-Base disturbance - evaluation and management  Dimas Chyle 12/31/2015, 4:38 PM

## 2015-12-31 NOTE — Anesthesia Procedure Notes (Signed)
Procedure Name: Intubation Date/Time: 12/31/2015 11:20 AM Performed by: Clearnce Sorrel Pre-anesthesia Checklist: Patient identified, Emergency Drugs available, Suction available, Patient being monitored and Timeout performed Patient Re-evaluated:Patient Re-evaluated prior to inductionOxygen Delivery Method: Circle system utilized Preoxygenation: Pre-oxygenation with 100% oxygen Intubation Type: IV induction, Cricoid Pressure applied and Rapid sequence Laryngoscope Size: Mac and 3 Grade View: Grade II Tube type: Oral Tube size: 7.0 mm Number of attempts: 1 Airway Equipment and Method: Stylet Placement Confirmation: ETT inserted through vocal cords under direct vision,  positive ETCO2 and breath sounds checked- equal and bilateral Secured at: 23 cm Tube secured with: Tape Dental Injury: Teeth and Oropharynx as per pre-operative assessment

## 2016-01-01 ENCOUNTER — Inpatient Hospital Stay (HOSPITAL_COMMUNITY): Payer: Medicare Other

## 2016-01-01 ENCOUNTER — Encounter (HOSPITAL_COMMUNITY): Payer: Self-pay | Admitting: General Surgery

## 2016-01-01 DIAGNOSIS — E875 Hyperkalemia: Secondary | ICD-10-CM

## 2016-01-01 DIAGNOSIS — I4891 Unspecified atrial fibrillation: Secondary | ICD-10-CM

## 2016-01-01 LAB — BASIC METABOLIC PANEL
Anion gap: 6 (ref 5–15)
Anion gap: 3 — ABNORMAL LOW (ref 5–15)
Anion gap: 7 (ref 5–15)
BUN: 29 mg/dL — ABNORMAL HIGH (ref 6–20)
BUN: 23 mg/dL — ABNORMAL HIGH (ref 6–20)
BUN: 34 mg/dL — ABNORMAL HIGH (ref 6–20)
CO2: 15 mmol/L — ABNORMAL LOW (ref 22–32)
CO2: 11 mmol/L — ABNORMAL LOW (ref 22–32)
CO2: 18 mmol/L — ABNORMAL LOW (ref 22–32)
Calcium: 7.5 mg/dL — ABNORMAL LOW (ref 8.9–10.3)
Calcium: 4.2 mg/dL — CL (ref 8.9–10.3)
Calcium: 7 mg/dL — ABNORMAL LOW (ref 8.9–10.3)
Chloride: 121 mmol/L — ABNORMAL HIGH (ref 101–111)
Chloride: 126 mmol/L — ABNORMAL HIGH (ref 101–111)
Chloride: 112 mmol/L — ABNORMAL HIGH (ref 101–111)
Creatinine, Ser: 2.23 mg/dL — ABNORMAL HIGH (ref 0.44–1.00)
Creatinine, Ser: 1.61 mg/dL — ABNORMAL HIGH (ref 0.44–1.00)
Creatinine, Ser: 2.66 mg/dL — ABNORMAL HIGH (ref 0.44–1.00)
GFR calc Af Amer: 24 mL/min — ABNORMAL LOW (ref >60)
GFR calc Af Amer: 36 mL/min — ABNORMAL LOW (ref >60)
GFR calc Af Amer: 20 mL/min — ABNORMAL LOW (ref >60)
GFR calc non Af Amer: 21 mL/min — ABNORMAL LOW (ref >60)
GFR calc non Af Amer: 31 mL/min — ABNORMAL LOW (ref >60)
GFR calc non Af Amer: 17 mL/min — ABNORMAL LOW (ref >60)
Glucose, Bld: 139 mg/dL — ABNORMAL HIGH (ref 65–99)
Glucose, Bld: 100 mg/dL — ABNORMAL HIGH (ref 65–99)
Glucose, Bld: 149 mg/dL — ABNORMAL HIGH (ref 65–99)
Potassium: 5.1 mmol/L (ref 3.5–5.1)
Potassium: 3.1 mmol/L — ABNORMAL LOW (ref 3.5–5.1)
Potassium: 4.5 mmol/L (ref 3.5–5.1)
Sodium: 142 mmol/L (ref 135–145)
Sodium: 140 mmol/L (ref 135–145)
Sodium: 137 mmol/L (ref 135–145)

## 2016-01-01 LAB — GLUCOSE, CAPILLARY
Glucose-Capillary: 109 mg/dL — ABNORMAL HIGH (ref 65–99)
Glucose-Capillary: 127 mg/dL — ABNORMAL HIGH (ref 65–99)
Glucose-Capillary: 129 mg/dL — ABNORMAL HIGH (ref 65–99)
Glucose-Capillary: 132 mg/dL — ABNORMAL HIGH (ref 65–99)
Glucose-Capillary: 169 mg/dL — ABNORMAL HIGH (ref 65–99)
Glucose-Capillary: 68 mg/dL (ref 65–99)
Glucose-Capillary: 97 mg/dL (ref 65–99)

## 2016-01-01 LAB — POCT I-STAT 3, ART BLOOD GAS (G3+)
Acid-base deficit: 12 mmol/L — ABNORMAL HIGH (ref 0.0–2.0)
Acid-base deficit: 9 mmol/L — ABNORMAL HIGH (ref 0.0–2.0)
Bicarbonate: 13.6 mmol/L — ABNORMAL LOW (ref 20.0–28.0)
Bicarbonate: 16.6 mmol/L — ABNORMAL LOW (ref 20.0–28.0)
O2 Saturation: 98 %
O2 Saturation: 97 %
Patient temperature: 99.4
Patient temperature: 99.9
TCO2: 15 mmol/L (ref 0–100)
TCO2: 17 mmol/L (ref 0–100)
pCO2 arterial: 31 mmHg — ABNORMAL LOW (ref 32.0–48.0)
pCO2 arterial: 32.3 mmHg (ref 32.0–48.0)
pH, Arterial: 7.254 — ABNORMAL LOW (ref 7.350–7.450)
pH, Arterial: 7.322 — ABNORMAL LOW (ref 7.350–7.450)
pO2, Arterial: 131 mmHg — ABNORMAL HIGH (ref 83.0–108.0)
pO2, Arterial: 105 mmHg (ref 83.0–108.0)

## 2016-01-01 LAB — CBC
HCT: 29.7 % — ABNORMAL LOW (ref 36.0–46.0)
Hemoglobin: 9 g/dL — ABNORMAL LOW (ref 12.0–15.0)
MCH: 29.1 pg (ref 26.0–34.0)
MCHC: 30.3 g/dL (ref 30.0–36.0)
MCV: 96.1 fL (ref 78.0–100.0)
Platelets: 220 10*3/uL (ref 150–400)
RBC: 3.09 MIL/uL — ABNORMAL LOW (ref 3.87–5.11)
RDW: 14.3 % (ref 11.5–15.5)
WBC: 14.3 10*3/uL — ABNORMAL HIGH (ref 4.0–10.5)

## 2016-01-01 LAB — LACTIC ACID, PLASMA: Lactic Acid, Venous: 2 mmol/L (ref 0.5–1.9)

## 2016-01-01 IMAGING — CR DG CHEST 1V PORT
1 series · 1 of 1 positions shown · non-contrast
Comparison: [DATE] chest radiograph.

CLINICAL DATA: 70 y/o F; endotracheal tube. Acute respiratory
failure.

EXAM:
PORTABLE CHEST 1 VIEW

[AP]
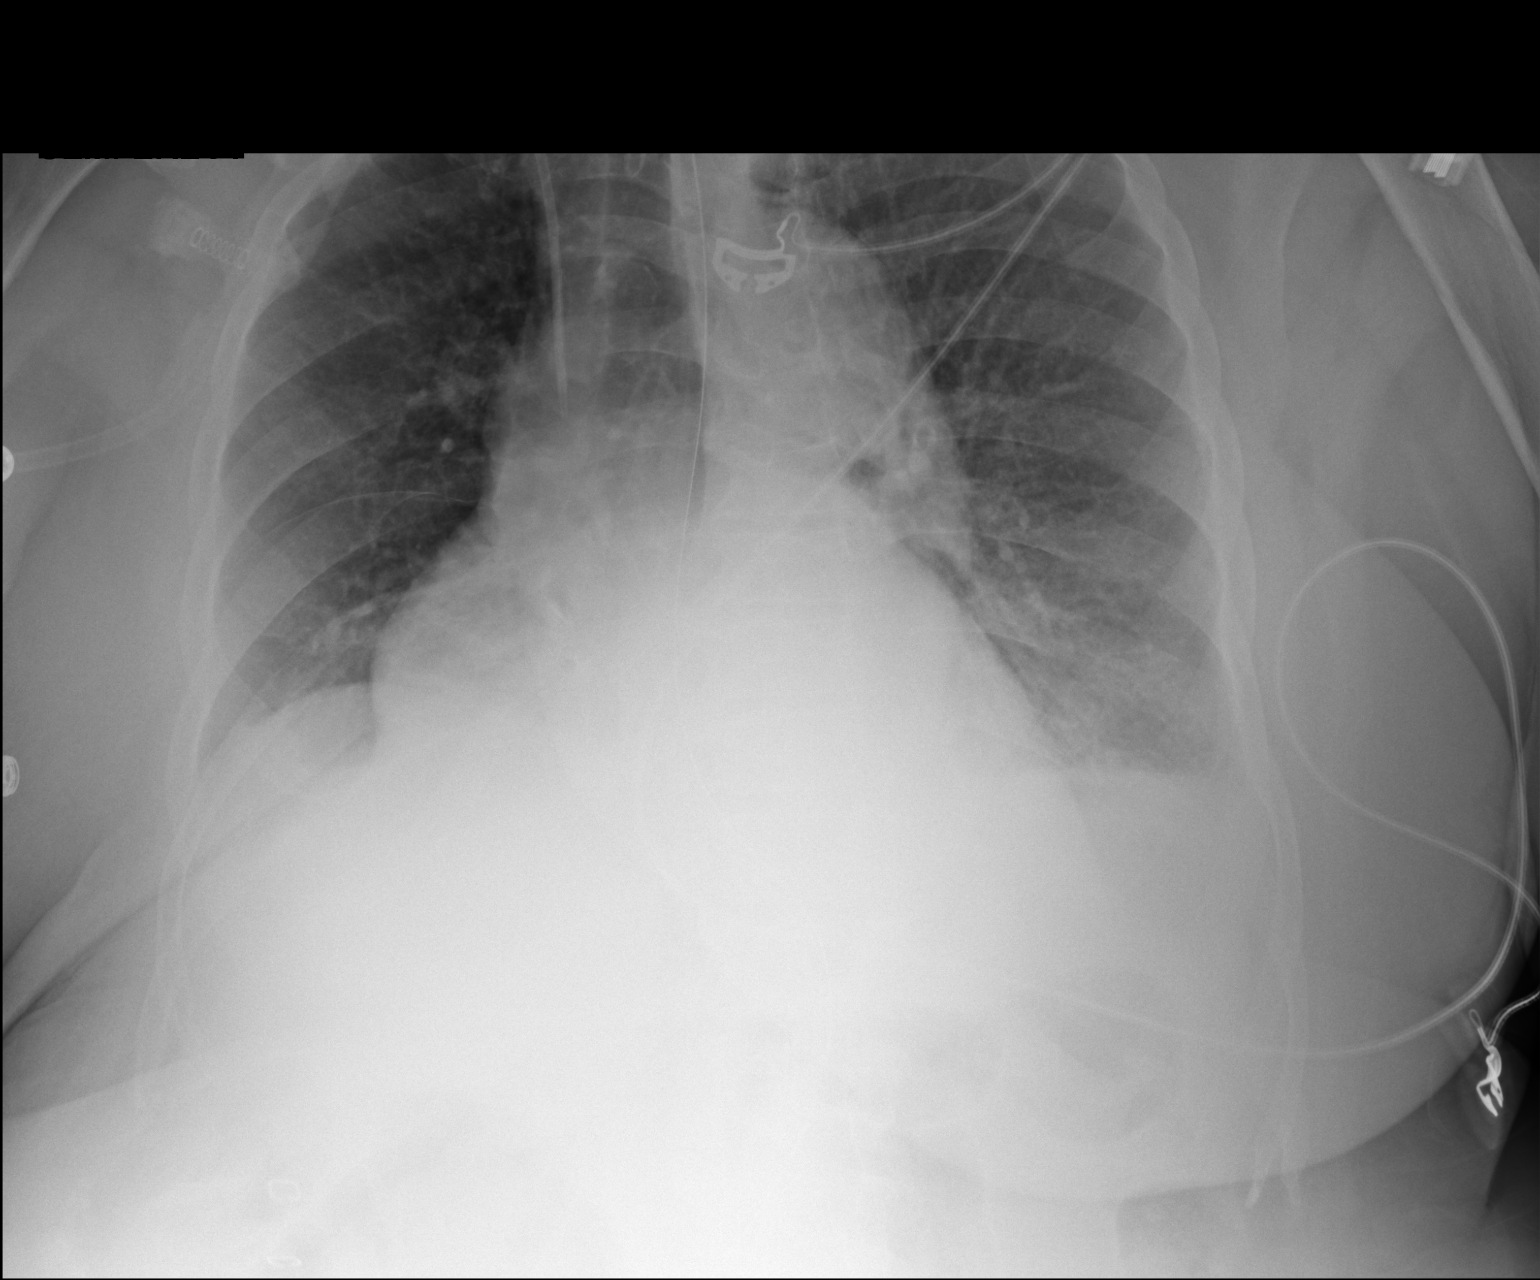

[1 of 1 positions shown; findings below may reference images not displayed]

FINDINGS: Endotracheal tube is slightly distracted now 5 cm from carina,
possibly exaggerated by project. Right central venous catheter tip
projects over mid SVC. Stable cardiomegaly. Increased attenuation of
left lung and blunted left costophrenic angle probably represents a
layering effusion. Interstitial pulmonary edema. Improved aeration
of right upper lobe. No new focal consolidation.
IMPRESSION: Stable left pleural effusion and interstitial pulmonary edema.
Improved aeration of right upper lobe.

By: SKEETE M.D.

## 2016-01-01 MED ORDER — PHENYLEPHRINE HCL 10 MG/ML IJ SOLN
0.0000 ug/min | INTRAVENOUS | Status: DC
  Administered 2016-01-01: 70 ug/min via INTRAVENOUS
  Administered 2016-01-01: 50 ug/min via INTRAVENOUS
  Filled 2016-01-01 (×2): qty 1

## 2016-01-01 MED ORDER — PHENYLEPHRINE HCL 10 MG/ML IJ SOLN
0.0000 ug/min | INTRAMUSCULAR | Status: DC
  Administered 2016-01-02: 20 ug/min via INTRAVENOUS
  Administered 2016-01-03: 35 ug/min via INTRAVENOUS
  Filled 2016-01-01 (×3): qty 4

## 2016-01-01 MED ORDER — SODIUM BICARBONATE 8.4 % IV SOLN
INTRAVENOUS | Status: DC
  Administered 2016-01-01 (×2): via INTRAVENOUS
  Filled 2016-01-01 (×4): qty 150

## 2016-01-01 MED ORDER — SODIUM CHLORIDE 0.9% FLUSH
10.0000 mL | INTRAVENOUS | Status: DC | PRN
  Administered 2016-01-10: 10 mL
  Filled 2016-01-01: qty 40

## 2016-01-01 MED ORDER — SODIUM CHLORIDE 0.9% FLUSH
10.0000 mL | Freq: Two times a day (BID) | INTRAVENOUS | Status: DC
  Administered 2016-01-01: 10 mL
  Administered 2016-01-01: 20 mL
  Administered 2016-01-02 – 2016-01-03 (×3): 10 mL
  Administered 2016-01-04: 30 mL
  Administered 2016-01-05 – 2016-01-10 (×9): 10 mL

## 2016-01-01 MED ORDER — PIPERACILLIN-TAZOBACTAM 3.375 G IVPB
3.3750 g | Freq: Three times a day (TID) | INTRAVENOUS | Status: DC
  Administered 2016-01-01 – 2016-01-07 (×19): 3.375 g via INTRAVENOUS
  Filled 2016-01-01 (×24): qty 50

## 2016-01-01 NOTE — Progress Notes (Addendum)
Pharmacy Antibiotic Note  Stephanie Griffith is a 70 y.o. female admitted on 12/30/2015 with incarcerated hernia and SBO. Pharmacy has been consulted for Zosyn dosing. Low grade fevers and WBC 14.3   Plan: Zosyn 3.375g IV q8h (4 hour infusion).  Monitor renal function, culture results, and clinical picture.   Height: 5\' 8"  (172.7 cm) Weight: 223 lb 8.7 oz (101.4 kg) IBW/kg (Calculated) : 63.9  Temp (24hrs), Avg:99.3 F (37.4 C), Min:98.3 F (36.8 C), Max:100.1 F (37.8 C)   Recent Labs Lab 12/29/15 2154 12/30/15 0114  12/30/15 1340  12/31/15 0210 12/31/15 0801 12/31/15 0921 12/31/15 1657 01/01/16 0400  WBC 13.7*  --   --   --   --  10.4  --   --   --  14.3*  CREATININE 1.88*  --   < > 1.92*  < > 1.96* 1.77* 2.00* 1.66* 2.23*  LATICACIDVEN  --  1.92*  --  2.5*  --   --   --   --   --  2.0*  < > = values in this interval not displayed.  Estimated Creatinine Clearance: 29.2 mL/min (by C-G formula based on SCr of 2.23 mg/dL (H)).    No Known Allergies  Antimicrobials this admission: 9/30 ancef x1  10/1 Zosyn >>   Dose adjustments this admission: n/a  Microbiology results: 9/29 MRSA PCR: negative   Thank you for allowing pharmacy to be a part of this patient's care.  Argie Ramming, PharmD Pharmacy Resident  Pager [phone] 01/01/16 7:50 AM    Pharmacy to sign off and follow for changes in renal function Thank you Anette Guarneri, PharmD [phone] 12:30 pm

## 2016-01-01 NOTE — Progress Notes (Signed)
Notified by lab critical calcium level.  The sample sent was hemolyzed per lab.  New lab draw sent stat.

## 2016-01-01 NOTE — Consult Note (Signed)
CARDIOLOGY CONSULT NOTE  Patient ID: Stephanie Griffith MRN: [id number] DOB/AGE: [birth year] 70 y.o.  Admit date: 12/30/2015 Primary Physician Kandice Hams, MD Primary Cardiologist New Chief Complaint  Atrial fib Requesting  Dr. Elsworth Soho  HPI:  The patient presents for evaluation of abdominal pain with nausea nd vomiting.  She is being treated for an incarcerated umbilical hernia and SBO.   She underwent repair of the hernia urgently.  She was noted preop to be in atrial fib.  Of note her initial EKG did demonstrated sinus tachycardia.   She is now post umbilical hernia repair.  She is intubated and sedated.  Atrial fib with rates in the 110s - 130s.  BP systolic in the 081K.  She is on IV cardizem.     Past Medical History:  Diagnosis Date  . Chronic renal insufficiency    Cr 1.38-1.93 in 2009  . Hernia   . Hypercholesteremia   . Hyperglycemia   . Hypertension   . PVD (peripheral vascular disease) ([place])    s/p LAKA  . Umbilical hernia     Past Surgical History:  Procedure Laterality Date  . AMPUTATION  06/06/2011   Procedure: AMPUTATION DIGIT;  Surgeon: Elam Dutch, MD;  [facility] OR;  Service: Vascular;  Laterality: Left;  Transmetatarsal amputation left great toe  . AMPUTATION  06/12/2011   Procedure: AMPUTATION ABOVE KNEE;  Surgeon: Elam Dutch, MD;  Location: [place];  Service: Vascular;  Laterality: Left;  . FEMORAL-POPLITEAL BYPASS GRAFT  06/06/2011   Procedure: BYPASS GRAFT FEMORAL-POPLITEAL ARTERY;  Surgeon: Elam Dutch, MD;  Location[facility] OR;  Service: Vascular;  Laterality: Left;  left femoral to popliteal bypass with composite vein and propaten 3mm x 80 graft  . LOWER EXTREMITY ANGIOGRAM N/A 06/04/2011   Procedure: LOWER EXTREMITY ANGIOGRAM;  Surgeon: Angelia Mould, MD;  Location: [facility] CATH LAB;  Service: Cardiovascular;  Laterality: N/A;  . OMENTECTOMY  12/31/2015   Procedure: Partial OMENTECTOMY;  Surgeon: Georganna Skeans, MD;  Location: [place];  Service:  General;;  . TUBAL LIGATION    . VENTRAL HERNIA REPAIR N/A 12/31/2015   Procedure: REPAIR INCARCERATED VENTRAL HERNIA, POSSIBLE BOWEL RESECTION;  Surgeon: Georganna Skeans, MD;  Location: [place];  Service: General;  Laterality: N/A;    No Known Allergies Prescriptions Prior to Admission  Medication Sig Dispense Refill Last Dose  . amLODipine (NORVASC) 5 MG tablet Take 10 mg by mouth daily.   2 12/28/2015 at Unknown time  . aspirin 81 MG EC tablet Take 81 mg by mouth daily.     12/28/2015  . atorvastatin (LIPITOR) 40 MG tablet Take 40 mg by mouth daily.   12/28/2015  . carvedilol (COREG) 25 MG tablet Take 25 mg by mouth 2 (two) times daily with a meal.   12/28/2015 at unk  . famotidine (PEPCID) 20 MG tablet Take 20 mg by mouth 2 (two) times daily.   12/28/2015  . gabapentin (NEURONTIN) 100 MG capsule Take 100 mg by mouth 2 (two) times daily.   5 12/28/2015  . olmesartan-hydrochlorothiazide (BENICAR HCT) 40-25 MG per tablet Take 1 tablet by mouth daily.   12/28/2015   Family History  Problem Relation Age of Onset  . Heart attack Father   . Heart disease Father   . Diabetes Mother   . Hypertension Mother   . Heart attack Brother     Social History   Social History  . Marital status: Married    Spouse name: N/A  .  Number of children: N/A  . Years of education: N/A   Occupational History  . Not on file.   Social History Main Topics  . Smoking status: Former Smoker    Packs/day: 2.00    Years: 50.00    Quit date: 06/01/2011  . Smokeless tobacco: Never Used  . Alcohol use No  . Drug use: No  . Sexual activity: Not Currently   Other Topics Concern  . Not on file   Social History Narrative  . No narrative on file     ROS:  Unable to obtain.  Patient intubated and sedated.  Physical Exam: Blood pressure (!) 90/48, pulse (!) 101, temperature 99.9 F (37.7 C), temperature source Axillary, resp. rate (!) 25, height 5\' 8"  (1.727 m), weight 223 lb 8.7 oz (101.4 kg), SpO2 100 %.  GENERAL:   Intubated in the ICU HEENT:  Orally intubated. NECK:  No jugular venous distention, waveform within normal limits, carotid upstroke brisk and symmetric, no bruits, no thyromegaly LUNGS:  Clear to auscultation bilaterally BACK:  No CVA tenderness CHEST:  Unremarkable HEART:  PMI not displaced or sustained,S1 and S2 within normal limits, no S3,  no clicks, no rubs, no murmurs, distant heart sounds, irregular ABD:  Distended with absent bowel sounds EXT:  2 plus pulses throughout, mild diffuse edema, no cyanosis no clubbing SKIN:  No rashes no nodules NEURO:  Intubated and sedated PSYCH:  Unable to assess   Labs: Lab Results  Component Value Date   BUN 29 (H) 01/01/2016   Lab Results  Component Value Date   CREATININE 2.23 (H) 01/01/2016   Lab Results  Component Value Date   NA 142 01/01/2016   K 5.1 01/01/2016   CL 121 (H) 01/01/2016   CO2 15 (L) 01/01/2016   Lab Results  Component Value Date   TROPONINI 0.29 (HH) 12/31/2015   Lab Results  Component Value Date   WBC 14.3 (H) 01/01/2016   HGB 9.0 (L) 01/01/2016   HCT 29.7 (L) 01/01/2016   MCV 96.1 01/01/2016   PLT 220 01/01/2016   Lab Results  Component Value Date   CHOL 94 06/09/2011   HDL 32 (L) 06/09/2011   LDLCALC 45 06/09/2011   TRIG 174 (H) 12/31/2015   CHOLHDL 2.9 06/09/2011   Lab Results  Component Value Date   ALT 13 (L) 12/31/2015   AST 24 12/31/2015   ALKPHOS 132 (H) 12/31/2015   BILITOT 0.5 12/31/2015    Radiology:   CXR: Endotracheal tube is slightly distracted now 5 cm from carina, possibly exaggerated by project. Right central venous catheter tip projects over mid SVC. Stable cardiomegaly. Increased attenuation of left lung and blunted left costophrenic angle probably represents a layering effusion. Interstitial pulmonary edema. Improved aeration of right upper lobe. No new focal consolidation.  EKG:   Atrial fibrillation with rapid ventricular response Anterior infarct , age  undetermined T wave abnormality, consider inferolateral ischemia When compared with ECG of 12/30/15 rhythm is no longer sinus  ASSESSMENT AND PLAN:   ATRIAL FIB:   Not able to anticoagulate with her recent surgery.  Rate will be difficult to control particularly as she is waking up from sedation for vent ween and likely will have post op pain.  Continue IV dilt and titrate as BP allows.  Control pain as much as possible.  However, we will likely be dealing with rates that range in the 130s.  No indication for cardioversion at this time.  I did review her  telemetry and she has had intermittent sinus rhythm but has not maintained this.  We will follow with you.    SignedMinus Breeding 01/01/2016, 2:50 PM

## 2016-01-01 NOTE — Progress Notes (Addendum)
CRITICAL VALUE ALERT  Critical value received:  PH 7.14, PO2 65, bi carb 11.5  Date of notification:  9/30  Time of notification:  2145  Critical value read back:Yes.    Nurse who received alert:  Rhina Brackett  MD notified (1st page):  Dr. Lyndel Safe at e-link  Time of first page:  2215  MD notified (2nd page):  Time of second page:  Responding MD:  Dr. Lyndel Safe  Time MD responded:  2215

## 2016-01-01 NOTE — Progress Notes (Signed)
Dr. Jimmy Footman notified of most recent blood gas after vent changes, low BP, low urine output, and K of 5.5. Orders received and implemented. Will continue to monitor patient.

## 2016-01-01 NOTE — Progress Notes (Signed)
[facility] Progress Note Patient Name: Stephanie Griffith DOB: 1945-06-10 MRN: [id number]   Date of Service  01/01/2016  HPI/Events of Note    eICU Interventions  Adding phenylephrine. Goal balance Neo with sedation fentanyl and propofol. Will also have dilt available for HR control if sedation inadequate to correct tachycardia     Intervention Category Intermediate Interventions: Hypotension - evaluation and management  BYRUM,ROBERT S. 01/01/2016, 8:34 PM

## 2016-01-01 NOTE — Progress Notes (Signed)
PULMONARY / CRITICAL CARE MEDICINE   Name: Stephanie Griffith MRN: [id number] DOB: 1945-12-29    ADMISSION DATE:  12/30/2015 CONSULTATION DATE:  01/01/2016  REFERRING MD:  Grandville Silos, MD  CHIEF COMPLAINT:  Postoperative vent management  HISTORY OF PRESENT ILLNESS:   70 year old ex-smoker with long-standing ventral hernia admitted with nausea and vomiting and abdominal pain. She  underwent repair of incarcerated ventral hernia that was causing small bowel obstruction. Preop showed atrial fibrillation requiring Cardizem drip and was hypertensive postoperatively, left on the ventilator, hence we're consulted. Other complicating features were AK I with hyperkalemia-for which she was treated with Kayexalate calcium and D50/insulin and hyperglycemia. She has no prior diagnosis of .  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 and is wheelchair bound but independent with transferring and ADLs She has long-standing hypertension and COPD with baseline creatinine of 1.4 and peripheral arterial disease    SUBJECTIVE:  Low gr fever UO weak Sedated on propofol + fent gtt   VITAL SIGNS: BP 97/61 (BP Location: Right Arm)   Pulse (!) 105   Temp 99.4 F (37.4 C) (Axillary)   Resp (!) 28   Ht 5\' 8"  (1.727 m)   Wt 223 lb 8.7 oz (101.4 kg)   SpO2 100%   BMI 33.99 kg/m   HEMODYNAMICS:    VENTILATOR SETTINGS: Vent Mode: PRVC FiO2 (%):  [40 %-60 %] 60 % Set Rate:  [16 bmp-32 bmp] 32 bmp Vt Set:  [450 mL-520 mL] 510 mL PEEP:  [5 cmH20-12 cmH20] 10 cmH20 Plateau Pressure:  [18 cmH20-33 cmH20] 27 cmH20  INTAKE / OUTPUT: I/O last 3 completed shifts: In: 4263.1 [I.V.:3413.1; Other:110; NG/GT:30; IV Piggyback:710] Out: 0277 [Urine:1600; Emesis/NG output:150; Blood:100]  PHYSICAL EXAMINATION: Gen. Elderly, obese, in no distress ENT - no lesions, no post nasal drip, ETT oral  Neck: No JVD, no thyromegaly, no carotid bruits Lungs: no use of accessory muscles, no dullness to percussion, decreased without  rales or rhonchi  Cardiovascular: Rhythm regular, heart sounds  normal, no murmurs or gallops, no peripheral edema Abdomen: Midline incision with bandage ,soft and non-tender, no hepatosplenomegaly, BS normal. Musculoskeletal: No deformities, no cyanosis or clubbing Neuro: Sedated on propofol, non focal   LABS:  BMET  Recent Labs Lab 12/31/15 0801 12/31/15 0921 12/31/15 1657 12/31/15 2130 01/01/16 0400  NA 135 143 136  --  142  K 5.2* 5.0 5.6* 5.5* 5.1  CL 114* 118* 115*  --  121*  CO2 14*  --  15*  --  15*  BUN 29* 29* 26*  --  29*  CREATININE 1.77* 2.00* 1.66*  --  2.23*  GLUCOSE 109* 88 122*  --  139*    Electrolytes  Recent Labs Lab 12/31/15 0801 12/31/15 1657 01/01/16 0400  CALCIUM 8.2* 7.5* 7.5*    CBC  Recent Labs Lab 12/29/15 2154 12/31/15 0210 12/31/15 0921 01/01/16 0400  WBC 13.7* 10.4  --  14.3*  HGB 11.4* 9.5* 9.9* 9.0*  HCT 36.9 30.6* 29.0* 29.7*  PLT 325 273  --  220    Coag's  Recent Labs Lab 12/30/15 1340  INR 1.06    Sepsis Markers  Recent Labs Lab 12/30/15 0114 12/30/15 1340 01/01/16 0400  LATICACIDVEN 1.92* 2.5* 2.0*    ABG  Recent Labs Lab 12/31/15 2145 12/31/15 2316 01/01/16 0403  PHART 7.127* 7.240* 7.254*  PCO2ART 34.9 30.0* 31.0*  PO2ART 65.0* 152.0* 131.0*    Liver Enzymes  Recent Labs Lab 12/29/15 2154 12/31/15 0210  AST 23  24  ALT 15 13*  ALKPHOS 178* 132*  BILITOT 0.4 0.5  ALBUMIN 4.0 3.0*    Cardiac Enzymes  Recent Labs Lab 12/31/15 1650  TROPONINI 0.29*    Glucose  Recent Labs Lab 12/30/15 2118 12/31/15 0803 12/31/15 1922 01/01/16 0006 01/01/16 0010 01/01/16 0400  GLUCAP 97 87 110* <10* 132* 127*    Imaging Portable Chest Xray  Result Date: 01/01/2016 CLINICAL DATA:  70 y/o F; endotracheal tube. Acute respiratory failure. EXAM: PORTABLE CHEST 1 VIEW COMPARISON:  12/31/2015 chest radiograph. FINDINGS: Endotracheal tube is slightly distracted now 5 cm from carina, possibly  exaggerated by project. Right central venous catheter tip projects over mid SVC. Stable cardiomegaly. Increased attenuation of left lung and blunted left costophrenic angle probably represents a layering effusion. Interstitial pulmonary edema. Improved aeration of right upper lobe. No new focal consolidation. IMPRESSION: Stable left pleural effusion and interstitial pulmonary edema. Improved aeration of right upper lobe. Electronically Signed   By: Kristine Garbe M.D.   On: 01/01/2016 05:52   Portable Chest Xray  Result Date: 12/31/2015 CLINICAL DATA:  Acute respiratory failure. EXAM: PORTABLE CHEST 1 VIEW COMPARISON:  Radiograph of December 30, 2015. FINDINGS: Stable cardiomegaly. No pneumothorax is noted. Interval placement of nasogastric tube seen entering stomach. Endotracheal tube is noted with distal tip 4 cm above the carina. Interval placement of right internal jugular catheter with distal tip in expected position of SVC. Mild bilateral perihilar and basilar interstitial densities are noted concerning for pulmonary edema. Probable mild left pleural effusion is noted. Right upper lobe opacity is noted concerning for possible pneumonia. Bony thorax is unremarkable. IMPRESSION: Endotracheal and nasogastric tubes in grossly good position. Right internal jugular catheter is noted with tip in expected position of SVC. Probable bilateral perihilar and basilar pulmonary edema. Probable right upper lobe pneumonia or possibly atelectasis. Electronically Signed   By: Marijo Conception, M.D.   On: 12/31/2015 14:05     STUDIES:  CT abdomen 9/29  Large supraumbilical ventral hernia containing a segment of the transverse colon and small bowel. There is compression of the small bowel at the neck of the hernia with small bowel obstruction  CULTURES:   ANTIBIOTICS:   SIGNIFICANT EVENTS: 9/30 repair of incarc hernia  LINES/TUBES: ETT 9/30 >> A line 9/30 >> CVL 9/30 >>   DISCUSSION: She seems  to have converted to normal sinus rhythm and hyperkalemia has improved. Metabolic acidosis & creatinine slight worse  ASSESSMENT / PLAN:  PULMONARY A: Postoperative respiratory failure ? Underlying COPD, heavy ex-smoker P:   PRVC vent settings - drop RR to 25 , autoPEEP on higher SBTs as tolerated, once acidosis improved  CARDIOVASCULAR A:  Hypertension New onset atrial fibrillation with RVR-Converted to normal sinus rhythm  Elevated troponin P:  Ct cardizem gtt - convert to oral coreg when able   RENAL A:   AKI -on CK D , baseline creatinine 1.4 range Hyperkalemia-resolved Metabolic acidosis P:   Hold ARB Start bicarb gtt  GASTROINTESTINAL A:   SBO , incarcerated hernia, postop repair P:   Nothing by mouth Tubefeedings to start per surgery once bowel function improves  HEMATOLOGIC A:   Anemia , chronic disease + acute blood loss P:  Transfuse for Hb < 7  INFECTIOUS A:   Low gr fever + rising WBC P:   Empiric zosyn  ENDOCRINE A:   Transient hyperglycemia, not a diabetic   P:   CBG every 4  NEUROLOGIC A:   Sedation for ventilation  Pain P:   RASS goal: 0 Propofol, intermittent Fent per PAD protocol   FAMILY  - Updates: 2 daughters at bedside , they work at [employer] meet or Palliative Care meeting due by:  NA   The patient is critically ill with multiple organ systems failure and requires high complexity decision making for assessment and support, frequent evaluation and titration of therapies, application of advanced monitoring technologies and extensive interpretation of multiple databases. Critical Care Time devoted to patient care services described in this note independent of APP time is 35 minutes.   Kara Mead MD. [facility]. [facility] Pager [phone] If no response call [phone]    01/01/2016, 7:33 AM

## 2016-01-01 NOTE — Progress Notes (Signed)
1 Day Post-Op  Subjective: Intubated sedated   Objective: Vital signs in last 24 hours: Temp:  [98.3 F (36.8 C)-100.1 F (37.8 C)] 99 F (37.2 C) (10/01 0756) Pulse Rate:  [79-118] 106 (10/01 0753) Resp:  [10-32] 25 (10/01 0753) BP: (68-133)/(39-78) 68/39 (10/01 0753) SpO2:  [94 %-100 %] 100 % (10/01 0754) Arterial Line BP: (70-184)/(34-72) 110/45 (10/01 0515) FiO2 (%):  [40 %-60 %] 50 % (10/01 0754) Weight:  [101.4 kg (223 lb 8.7 oz)] 101.4 kg (223 lb 8.7 oz) (10/01 0245) Last BM Date: 12/30/15  Intake/Output from previous day: 09/30 0701 - 10/01 0700 In: 2894.4 [I.V.:2044.4; NG/GT:30; IV Piggyback:710] Out: 1250 [Urine:1000; Emesis/NG output:150; Blood:100] Intake/Output this shift: No intake/output data recorded.  Incision/Wound:CDI soft abdomen  Lab Results:   Recent Labs  12/31/15 0210 12/31/15 0921 01/01/16 0400  WBC 10.4  --  14.3*  HGB 9.5* 9.9* 9.0*  HCT 30.6* 29.0* 29.7*  PLT 273  --  220   BMET  Recent Labs  12/31/15 1657 12/31/15 2130 01/01/16 0400  NA 136  --  142  K 5.6* 5.5* 5.1  CL 115*  --  121*  CO2 15*  --  15*  GLUCOSE 122*  --  139*  BUN 26*  --  29*  CREATININE 1.66*  --  2.23*  CALCIUM 7.5*  --  7.5*   PT/INR  Recent Labs  12/30/15 1340  LABPROT 13.9  INR 1.06   ABG  Recent Labs  12/31/15 2316 01/01/16 0403  PHART 7.240* 7.254*  HCO3 12.8* 13.6*    Studies/Results: Portable Chest Xray  Result Date: 01/01/2016 CLINICAL DATA:  70 y/o F; endotracheal tube. Acute respiratory failure. EXAM: PORTABLE CHEST 1 VIEW COMPARISON:  12/31/2015 chest radiograph. FINDINGS: Endotracheal tube is slightly distracted now 5 cm from carina, possibly exaggerated by project. Right central venous catheter tip projects over mid SVC. Stable cardiomegaly. Increased attenuation of left lung and blunted left costophrenic angle probably represents a layering effusion. Interstitial pulmonary edema. Improved aeration of right upper lobe. No new  focal consolidation. IMPRESSION: Stable left pleural effusion and interstitial pulmonary edema. Improved aeration of right upper lobe. Electronically Signed   By: Kristine Garbe M.D.   On: 01/01/2016 05:52   Portable Chest Xray  Result Date: 12/31/2015 CLINICAL DATA:  Acute respiratory failure. EXAM: PORTABLE CHEST 1 VIEW COMPARISON:  Radiograph of December 30, 2015. FINDINGS: Stable cardiomegaly. No pneumothorax is noted. Interval placement of nasogastric tube seen entering stomach. Endotracheal tube is noted with distal tip 4 cm above the carina. Interval placement of right internal jugular catheter with distal tip in expected position of SVC. Mild bilateral perihilar and basilar interstitial densities are noted concerning for pulmonary edema. Probable mild left pleural effusion is noted. Right upper lobe opacity is noted concerning for possible pneumonia. Bony thorax is unremarkable. IMPRESSION: Endotracheal and nasogastric tubes in grossly good position. Right internal jugular catheter is noted with tip in expected position of SVC. Probable bilateral perihilar and basilar pulmonary edema. Probable right upper lobe pneumonia or possibly atelectasis. Electronically Signed   By: Marijo Conception, M.D.   On: 12/31/2015 14:05   Dg Chest Port 1 View  Result Date: 12/30/2015 CLINICAL DATA:  Shortness of breath.  Abdominal pain. EXAM: PORTABLE CHEST 1 VIEW COMPARISON:  One-view chest x-ray 06/07/2011 FINDINGS: The heart is mildly enlarged. Mild edema is present. Small effusions are suspected. Bibasilar airspace disease likely reflects atelectasis. Lung apices are clear. The visualized soft tissues and bony thorax  are unremarkable. IMPRESSION: 1. Cardiomegaly with mild edema and small effusions suggesting congestive heart failure. 2. Mild the bibasilar airspace disease likely reflects atelectasis. Electronically Signed   By: San Morelle M.D.   On: 12/30/2015 08:45     Anti-infectives: Anti-infectives    Start     Dose/Rate Route Frequency Ordered Stop   01/01/16 0800  piperacillin-tazobactam (ZOSYN) IVPB 3.375 g     3.375 g 12.5 mL/hr over 240 Minutes Intravenous Every 8 hours 01/01/16 0746        Assessment/Plan: s/p Procedure(s): REPAIR INCARCERATED VENTRAL HERNIA, POSSIBLE BOWEL RESECTION (N/A) Partial OMENTECTOMY On vent and managed by CCM Wound   CDI   LOS: 2 days    Swayze Pries A. 01/01/2016

## 2016-01-01 NOTE — Progress Notes (Signed)
1800:  [place] MD, Dr. Lamonte Sakai, notified of left hand nail beds color being a touch cyanotic.  Left radial pulse 2+ and unchanged from assessment.  Warm compresses applied to left hand.  Left radial a line d/c'd.  Dr. Lamonte Sakai made aware.  Propofol resumed at 46mcg/kg/min, cardizem decreased to 5mg /hr.  Afib resumed at rate of 150bpm.  Cardizem turned up to 15mg /hr.  Will continue to closely monitor.

## 2016-01-02 ENCOUNTER — Inpatient Hospital Stay (HOSPITAL_COMMUNITY): Payer: Medicare Other

## 2016-01-02 DIAGNOSIS — J9601 Acute respiratory failure with hypoxia: Secondary | ICD-10-CM

## 2016-01-02 DIAGNOSIS — J810 Acute pulmonary edema: Secondary | ICD-10-CM

## 2016-01-02 DIAGNOSIS — I480 Paroxysmal atrial fibrillation: Secondary | ICD-10-CM

## 2016-01-02 LAB — BLOOD GAS, ARTERIAL
Acid-base deficit: 13 mmol/L — ABNORMAL HIGH (ref 0.0–2.0)
Bicarbonate: 13.7 mmol/L — ABNORMAL LOW (ref 20.0–28.0)
Drawn by: 330991
FIO2: 50
MECHVT: 520 mL
O2 Saturation: 96.1 %
PEEP: 5 cmH2O
Patient temperature: 98.6
RATE: 16 resp/min
pCO2 arterial: 37.9 mmHg (ref 32.0–48.0)
pH, Arterial: 7.183 — CL (ref 7.350–7.450)
pO2, Arterial: 99.8 mmHg (ref 83.0–108.0)

## 2016-01-02 LAB — GLUCOSE, CAPILLARY
Glucose-Capillary: 103 mg/dL — ABNORMAL HIGH (ref 65–99)
Glucose-Capillary: 111 mg/dL — ABNORMAL HIGH (ref 65–99)
Glucose-Capillary: 56 mg/dL — ABNORMAL LOW (ref 65–99)
Glucose-Capillary: 88 mg/dL (ref 65–99)
Glucose-Capillary: 88 mg/dL (ref 65–99)
Glucose-Capillary: 99 mg/dL (ref 65–99)
Glucose-Capillary: <10 mg/dL — CL (ref 65–99)

## 2016-01-02 LAB — CBC
HCT: 25.9 % — ABNORMAL LOW (ref 36.0–46.0)
Hemoglobin: 7.9 g/dL — ABNORMAL LOW (ref 12.0–15.0)
MCH: 28.6 pg (ref 26.0–34.0)
MCHC: 30.5 g/dL (ref 30.0–36.0)
MCV: 93.8 fL (ref 78.0–100.0)
Platelets: 194 10*3/uL (ref 150–400)
RBC: 2.76 MIL/uL — ABNORMAL LOW (ref 3.87–5.11)
RDW: 14.2 % (ref 11.5–15.5)
WBC: 13.1 10*3/uL — ABNORMAL HIGH (ref 4.0–10.5)

## 2016-01-02 LAB — BASIC METABOLIC PANEL
Anion gap: 7 (ref 5–15)
BUN: 38 mg/dL — ABNORMAL HIGH (ref 6–20)
CO2: 20 mmol/L — ABNORMAL LOW (ref 22–32)
Calcium: 7.3 mg/dL — ABNORMAL LOW (ref 8.9–10.3)
Chloride: 114 mmol/L — ABNORMAL HIGH (ref 101–111)
Creatinine, Ser: 2.79 mg/dL — ABNORMAL HIGH (ref 0.44–1.00)
GFR calc Af Amer: 19 mL/min — ABNORMAL LOW (ref >60)
GFR calc non Af Amer: 16 mL/min — ABNORMAL LOW (ref >60)
Glucose, Bld: 108 mg/dL — ABNORMAL HIGH (ref 65–99)
Potassium: 4.3 mmol/L (ref 3.5–5.1)
Sodium: 141 mmol/L (ref 135–145)

## 2016-01-02 LAB — MAGNESIUM: Magnesium: 1.3 mg/dL — ABNORMAL LOW (ref 1.7–2.4)

## 2016-01-02 LAB — PHOSPHORUS: Phosphorus: 4.2 mg/dL (ref 2.5–4.6)

## 2016-01-02 IMAGING — CT CT HEAD W/O CM
4 series · 16 of 47 positions shown, 18 images · non-contrast
Comparison: None.

CLINICAL DATA: Altered mental status

EXAM:
CT HEAD WITHOUT CONTRAST
TECHNIQUE: Contiguous axial images were obtained from the base of the skull
through the vertex without intravenous contrast.

[Series 2: head without · axial · non-contrast · 0.47mm/px · z∈[+1320,+1430]mm · 7 of 30 slices shown, 9 images]
[im 4/30  brain]
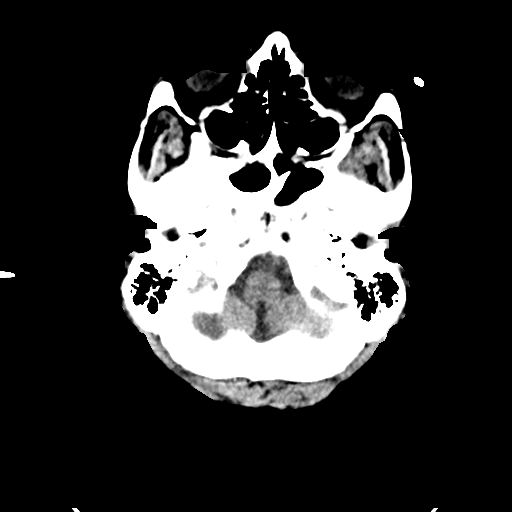
[im 4/30  bone]
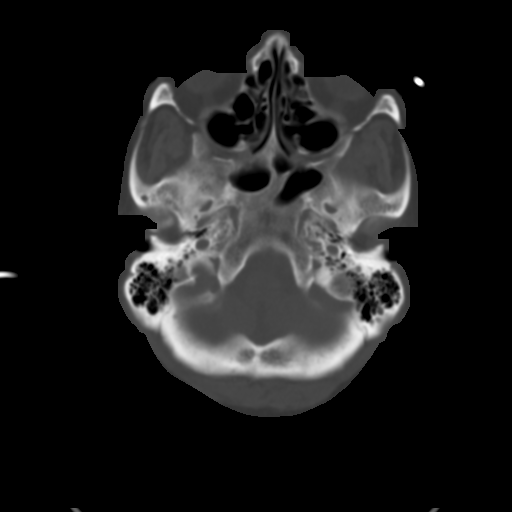
[im 8/30  brain]
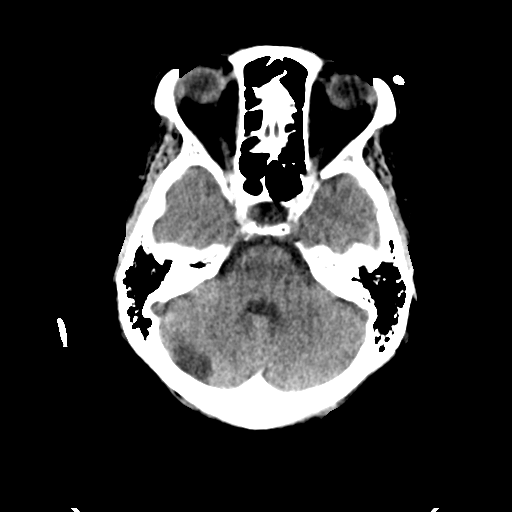
[im 11/30  brain]
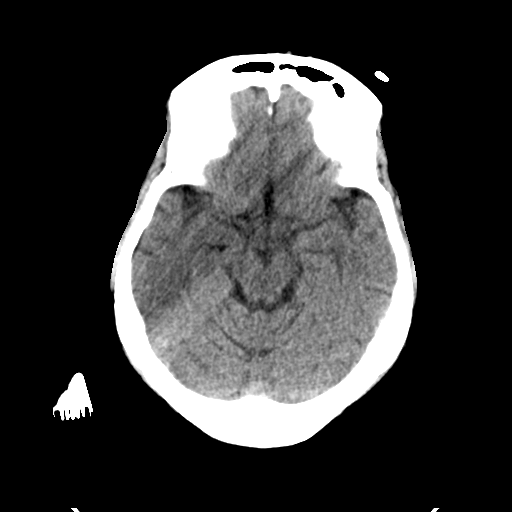
[im 15/30  brain]
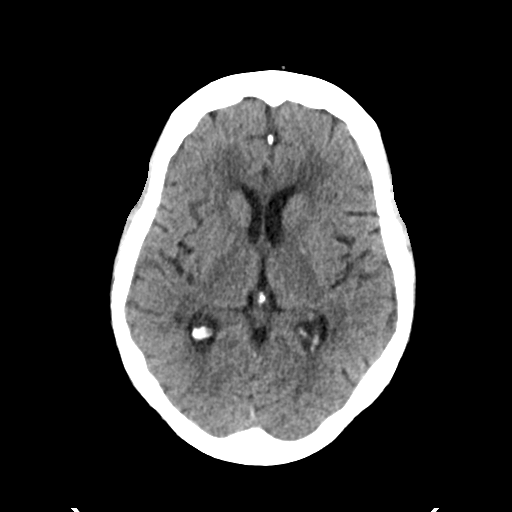
[im 19/30  brain]
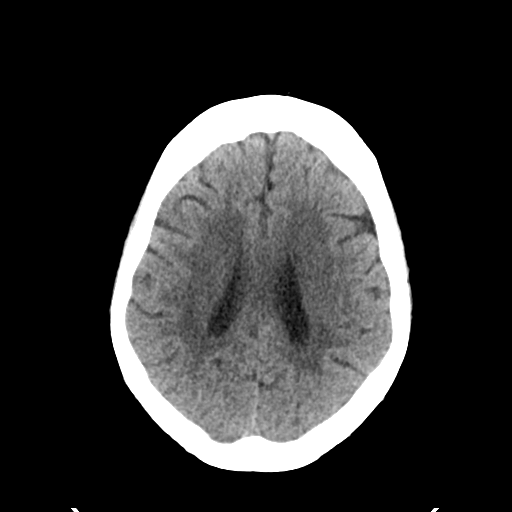
[im 19/30  bone]
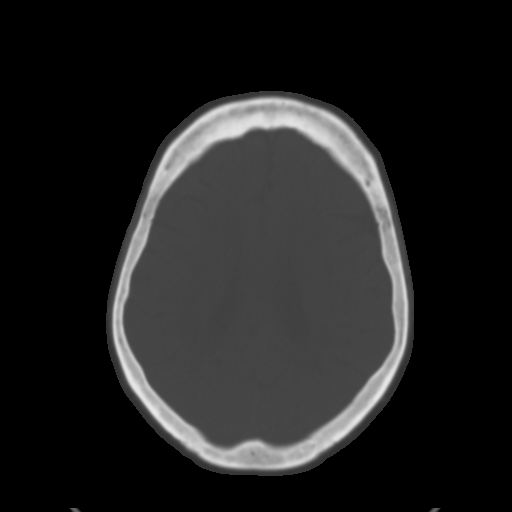
[im 22/30  brain]
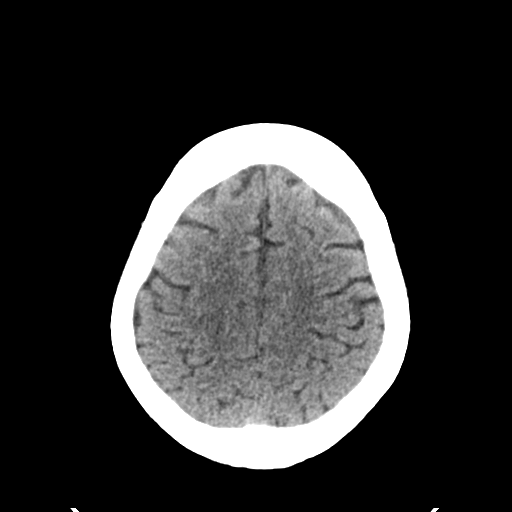
[im 26/30  brain]
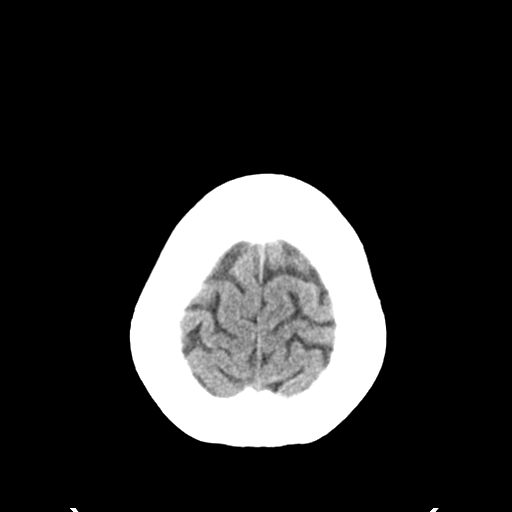

[Series 3: head bone · axial · 0.47mm/px · z∈[+1319,+1349]mm · 3 of 75 slices shown]
[im 8/75  bone]
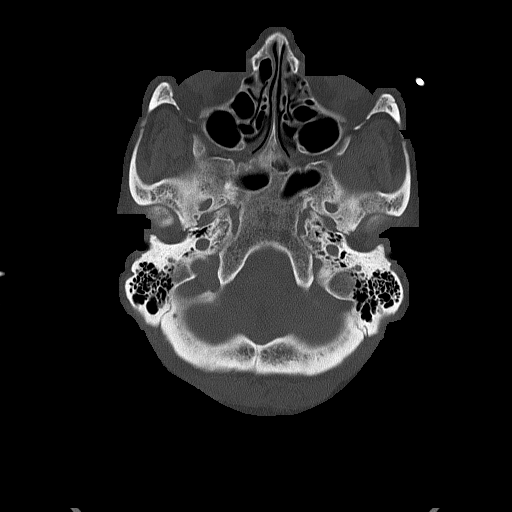
[im 15/75  bone]
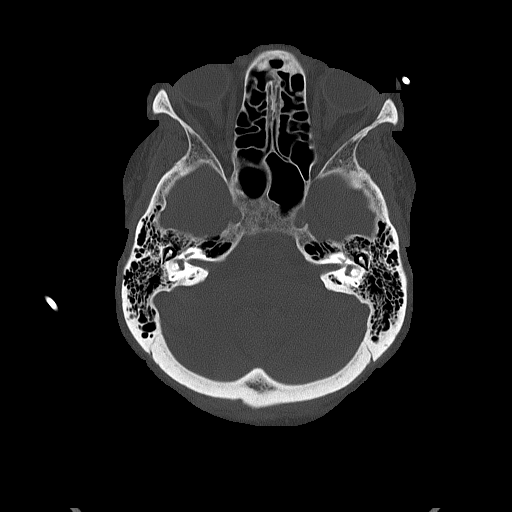
[im 23/75  bone]
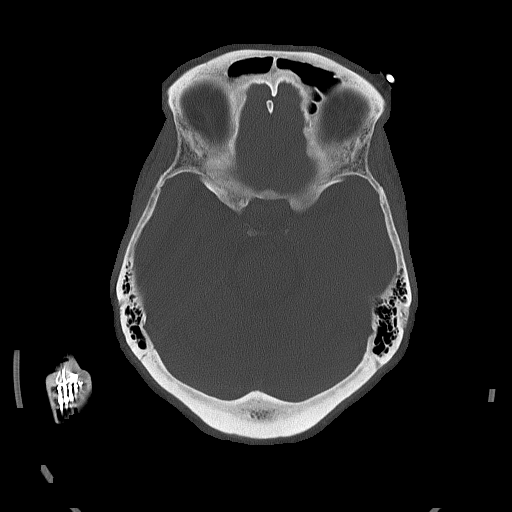

[Series 4: head without cor · coronal · non-contrast · 0.29mm/px · 3 of 64 slices shown]
[im 22/64  brain]
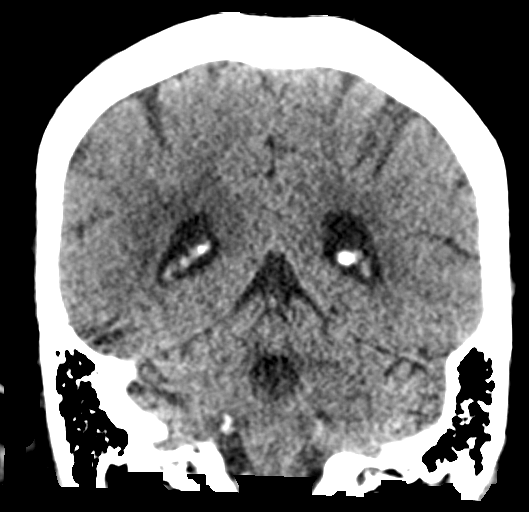
[im 29/64  brain]
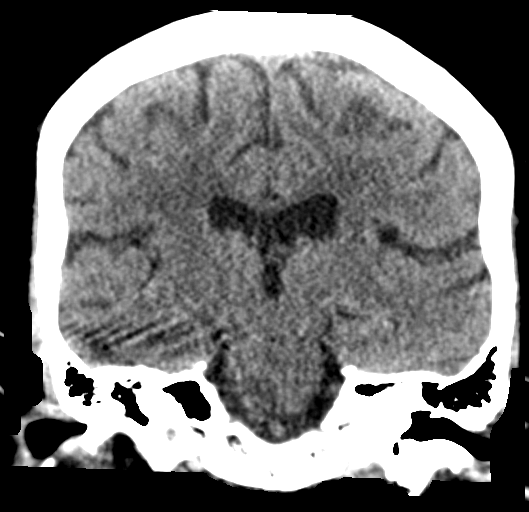
[im 36/64  brain]
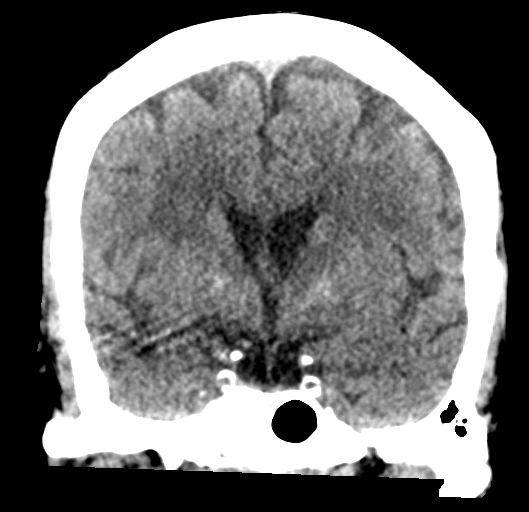

[Series 5: head without sag · sagittal · non-contrast · 0.29mm/px · 3 of 44 slices shown]
[im 15/44  brain]
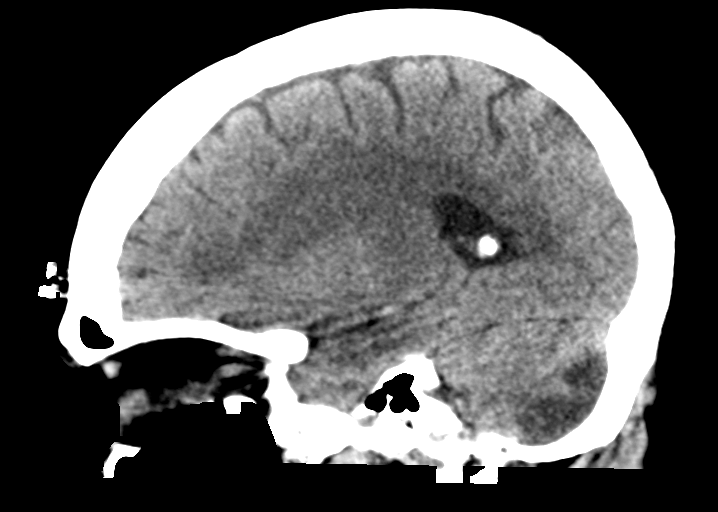
[im 22/44  brain]
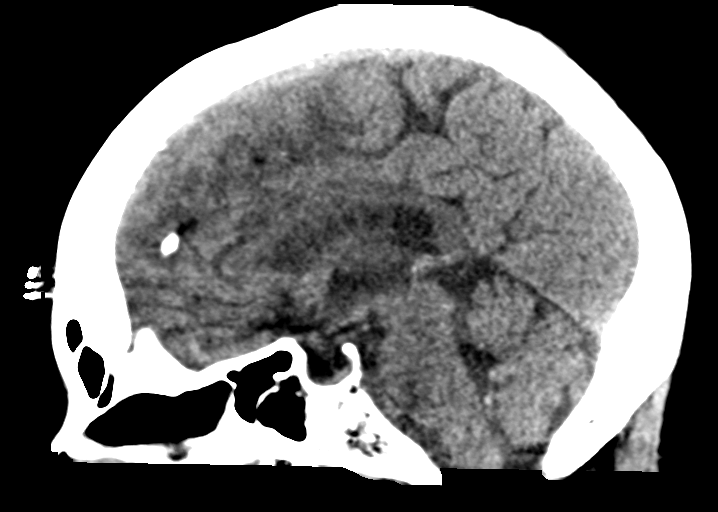
[im 29/44  brain]
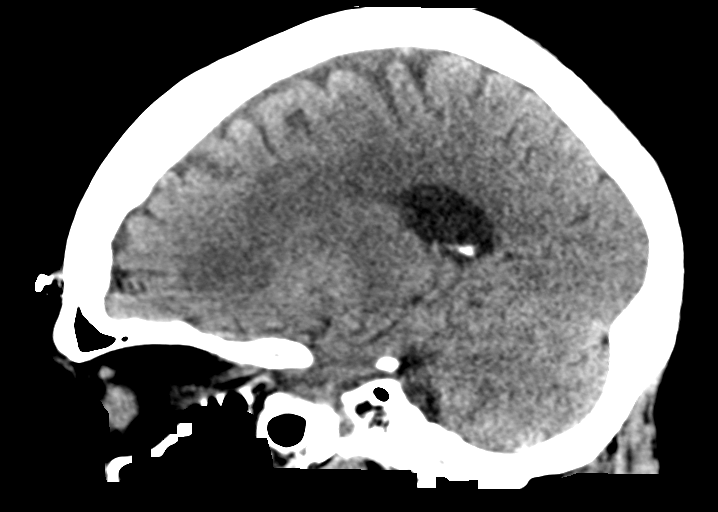

[16 of 47 positions shown; findings below may reference images not displayed]

FINDINGS: Brain: The ventricles are normal in size and configuration. There is
mild invagination of CSF into the sella. There is no intracranial
mass, hemorrhage, extra-axial fluid collection, or midline shift.
Decreased attenuation in the inferior lateral right cerebellum there
is felt to be consistent with a prior infarct involving a branch of
the right posterior inferior cerebral artery. Elsewhere there is
small vessel disease in the centra semiovale bilaterally. Small
vessel disease is also noted in each thalamus. No acute appearing
infarct is evident. Basal ganglia calcification is likely
physiologic in this age group.

Vascular: There is no hyperdense vessel evident. There is
calcification in each carotid siphon region.

Skull: The bony calvarium appears intact.

Sinuses/Orbits: There is a small retention cyst in the anterior left
maxillary antrum. There is mucosal thickening in several ethmoid air
cells on the left, mild. Visualized paranasal sinuses elsewhere
clear. Orbits appear symmetric bilaterally.

Other: Mastoid air cells are clear.
IMPRESSION: Prior infarct involving a branch of the right posterior inferior
cerebellar artery. Extensive supratentorial small vessel disease
evident. No intracranial mass, hemorrhage, or evidence of acute
infarct.

Mild paranasal sinus disease. Foci of arterial vascular
calcification evident. Mild invagination of CSF into the sella is
noted, a finding of questionable significance.

## 2016-01-02 IMAGING — CR DG CHEST 1V PORT
1 series · 1 of 1 positions shown · non-contrast
Comparison: [DATE].

CLINICAL DATA: Hypoxia.

EXAM:
PORTABLE CHEST 1 VIEW

[AP]
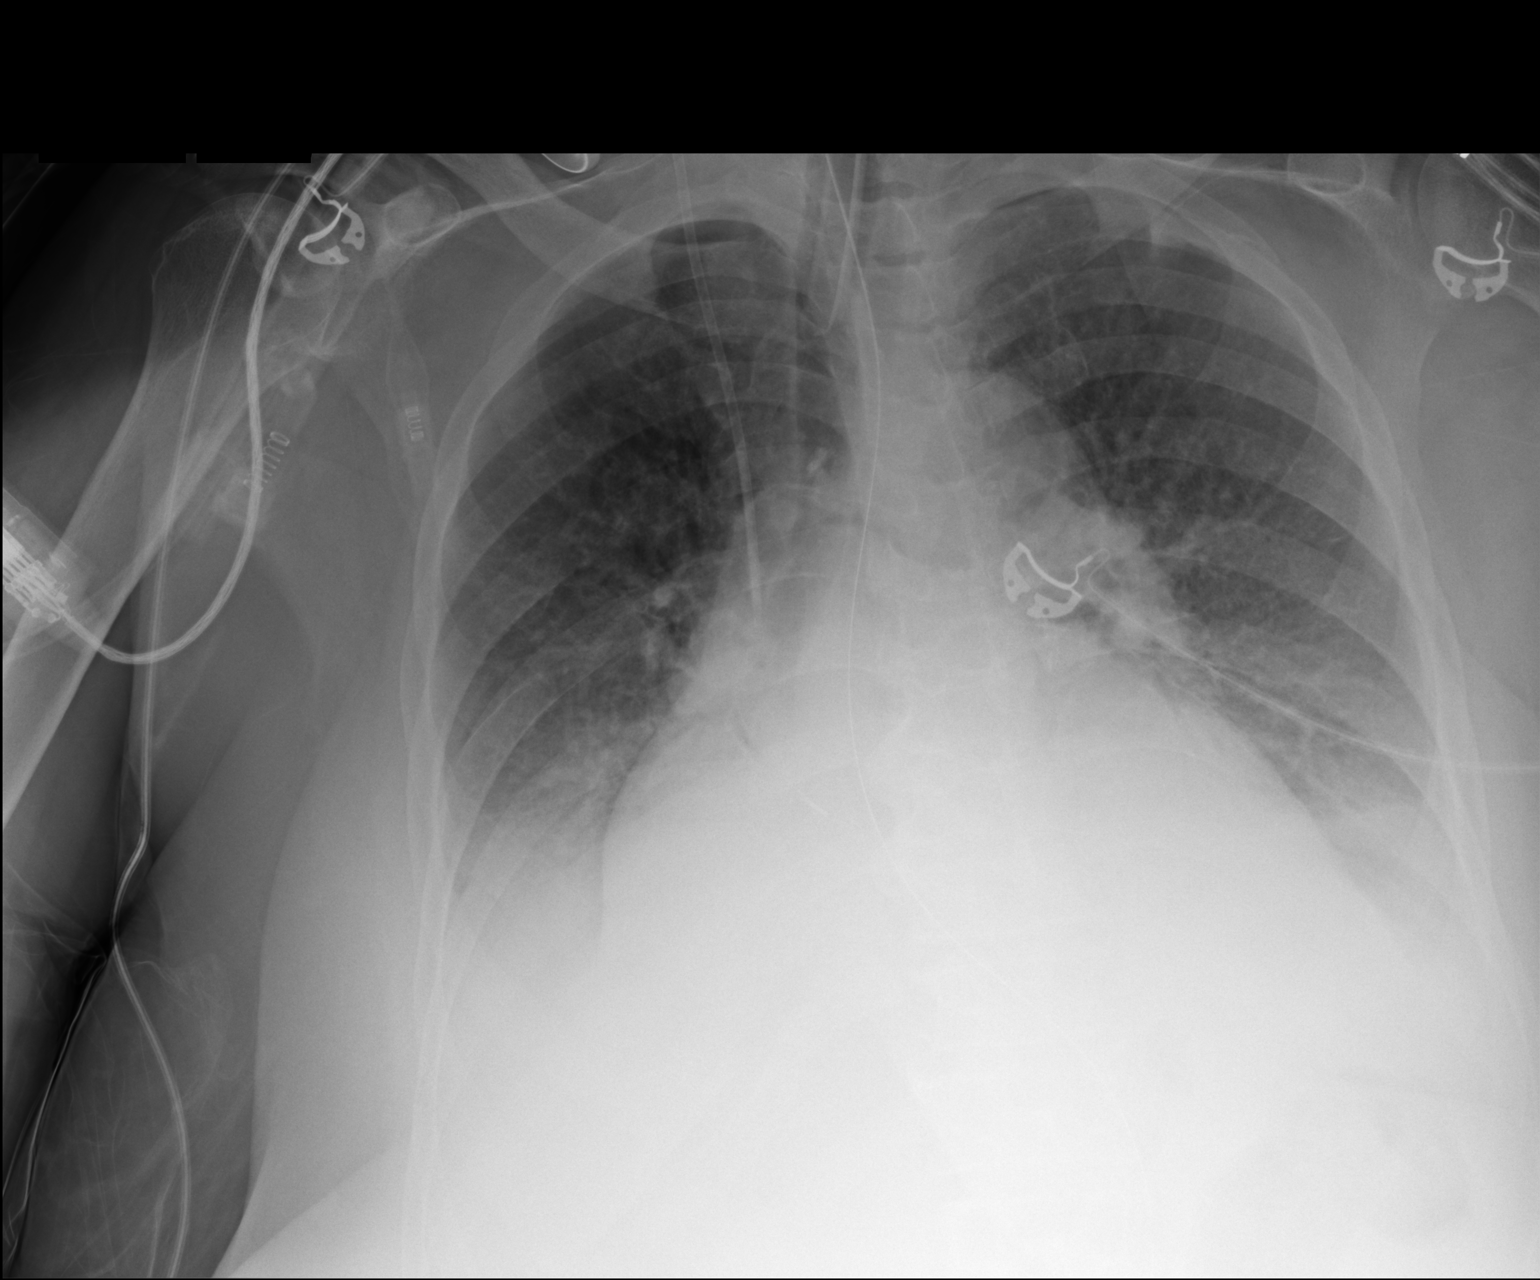

[1 of 1 positions shown; findings below may reference images not displayed]

FINDINGS: Endotracheal tube, NG tube, right IJ line stable position.
Cardiomegaly with bilateral pulmonary interstitial prominence and
bilateral pleural effusions noted consistent with congestive heart
failure. Interim worsening from prior exam. No pneumothorax .
IMPRESSION: 1.  Lines and tubes in stable position.

2. Severe cardiomegaly with bilateral pulmonary interstitial
prominence and bilateral pleural effusions. Findings consistent with
congestive heart failure. Interim worsening from prior exam.

## 2016-01-02 MED ORDER — MAGNESIUM SULFATE 2 GM/50ML IV SOLN
2.0000 g | Freq: Once | INTRAVENOUS | Status: AC
  Administered 2016-01-02: 2 g via INTRAVENOUS
  Filled 2016-01-02: qty 50

## 2016-01-02 MED ORDER — FAMOTIDINE IN NACL 20-0.9 MG/50ML-% IV SOLN
20.0000 mg | INTRAVENOUS | Status: DC
  Administered 2016-01-03 – 2016-01-10 (×8): 20 mg via INTRAVENOUS
  Filled 2016-01-02 (×8): qty 50

## 2016-01-02 MED ORDER — IPRATROPIUM BROMIDE 0.02 % IN SOLN
0.5000 mg | RESPIRATORY_TRACT | Status: DC
  Administered 2016-01-02 – 2016-01-04 (×12): 0.5 mg via RESPIRATORY_TRACT
  Filled 2016-01-02 (×12): qty 2.5

## 2016-01-02 NOTE — Progress Notes (Signed)
1430:  In CT, pt's IV pole fell onto floor.  Fentanyl gtts became disconnected from IV tubing/some spilled onto floor, remaining fluid in bag wasted in sink witnessed by Marton Redwood.

## 2016-01-02 NOTE — Care Management Important Message (Signed)
Important Message  Patient Details  Name: Stephanie Griffith MRN: [id number] Date of Birth: 01/17/1946   Medicare Important Message Given:  Yes    Nathen May 01/02/2016, 12:17 PM

## 2016-01-02 NOTE — Progress Notes (Signed)
RT note- Patient transported to CT and returned to ICU on current settings.

## 2016-01-02 NOTE — Progress Notes (Signed)
PULMONARY / CRITICAL CARE MEDICINE   Name: Stephanie Griffith MRN: [id number] DOB: June 29, 1945    ADMISSION DATE:  12/30/2015 CONSULTATION DATE:  01/02/2016  REFERRING MD:  Grandville Silos, MD  CHIEF COMPLAINT:  Postoperative vent management  HISTORY OF PRESENT ILLNESS:   70 year old ex-smoker with long-standing ventral hernia admitted with nausea and vomiting and abdominal pain. She  underwent repair of incarcerated ventral hernia that was causing small bowel obstruction. Preop showed atrial fibrillation requiring Cardizem drip and was hypertensive postoperatively, left on the ventilator, hence we're consulted. Other complicating features were AK I with hyperkalemia-for which she was treated with Kayexalate calcium and D50/insulin and hyperglycemia. She has no prior diagnosis of .  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  Cardizem dripped was discontinued 2/2 hypotension related to it and sedation.  On WUA now >  Drowsy, does not follow  commands, uncomfortable, tachypneic, vent dysynchrony with rapid afib 130-160s.    VITAL SIGNS: BP (!) 95/51 (BP Location: Right Arm)   Pulse (!) 103   Temp 99.9 F (37.7 C) (Axillary)   Resp (!) 25   Ht 5\' 8"  (1.727 m)   Wt 103.2 kg (227 lb 8.2 oz)   SpO2 100%   BMI 34.59 kg/m   HEMODYNAMICS:    VENTILATOR SETTINGS: Vent Mode: PRVC FiO2 (%):  [40 %-50 %] 40 % Set Rate:  [25 bmp] 25 bmp Vt Set:  [510 mL] 510 mL PEEP:  [5 cmH20-10 cmH20] 5 cmH20 Pressure Support:  [8 cmH20] 8 cmH20 Plateau Pressure:  [17 cmH20-21 cmH20] 21 cmH20  INTAKE / OUTPUT: I/O last 3 completed shifts: In: 3611.9 [I.V.:2521.9; Other:100; NG/GT:90; IV Piggyback:900] Out: 1050 [Urine:800; Emesis/NG output:250]  PHYSICAL EXAMINATION: Gen. Elderly, obese, in distress  during PST ENT - no lesions, no post nasal drip, ETT oral  Neck: Hard to assess JVD, no thyromegaly, no carotid bruits Lungs: no use of accessory muscles, no dullness to percussion, decreased. bibasilar crackles. Cardiovascular: variable s1.  no murmurs or gallops, Gr 1 edema Abdomen: Midline incision with bandage ,soft and non-tender, no hepatosplenomegaly, BS decreased Musculoskeletal: No deformities, no cyanosis or clubbing,. (+) AKA stump Neuro: CN grossly N. (-) lateralizing signs seen.    LABS:  BMET  Recent Labs Lab 01/01/16 1723 01/01/16 1840 01/02/16 0340  NA 140 137 141  K 3.1* 4.5 4.3  CL 126* 112* 114*  CO2 11* 18* 20*  BUN 23* 34* 38*  CREATININE 1.61* 2.66* 2.79*  GLUCOSE 100* 149* 108*    Electrolytes  Recent Labs Lab 01/01/16 1723 01/01/16 1840 01/02/16 0340  CALCIUM 4.2* 7.0* 7.3*  MG  --   --  1.3*  PHOS  --   --  4.2    CBC  Recent Labs Lab 12/31/15 0210 12/31/15 0921 01/01/16 0400 01/02/16 0340  WBC 10.4  --  14.3* 13.1*  HGB 9.5* 9.9* 9.0* 7.9*  HCT 30.6* 29.0* 29.7* 25.9*  PLT 273  --  220 194    Coag's  Recent Labs Lab 12/30/15 1340  INR 1.06    Sepsis Markers  Recent Labs Lab 12/30/15 0114 12/30/15 1340 01/01/16 0400  LATICACIDVEN 1.92* 2.5* 2.0*    ABG  Recent Labs Lab 12/31/15 2316 01/01/16 0403 01/01/16 1419  PHART 7.240* 7.254* 7.322*  PCO2ART 30.0* 31.0* 32.3  PO2ART 152.0* 131.0* 105.0    Liver Enzymes  Recent Labs Lab 12/29/15 2154 12/31/15 0210  AST 23 24  ALT 15 13*  ALKPHOS 178* 132*  BILITOT 0.4 0.5  ALBUMIN 4.0 3.0*    Cardiac Enzymes  Recent Labs Lab 12/31/15 1650  TROPONINI 0.29*    Glucose  Recent Labs Lab 01/01/16 1137 01/01/16 1545 01/01/16 1930 01/01/16 2340 01/02/16 0343 01/02/16 0738  GLUCAP 97 109* 129* 169* 103* 88    Imaging Dg Chest Port 1 View  Result Date: 01/02/2016 CLINICAL DATA:  Hypoxia. EXAM: PORTABLE CHEST 1 VIEW COMPARISON:  01/01/2016.  FINDINGS: Endotracheal tube, NG tube, right IJ line stable position. Cardiomegaly with bilateral pulmonary interstitial prominence and bilateral pleural effusions noted consistent with congestive heart failure. Interim worsening from prior exam. No pneumothorax . IMPRESSION: 1.  Lines and tubes in stable position. 2. Severe cardiomegaly with bilateral pulmonary interstitial prominence and bilateral pleural effusions. Findings consistent with congestive heart failure. Interim worsening from prior exam. Electronically Signed   By: Baxley   On: 01/02/2016 07:25     STUDIES:  CT abdomen 9/29  Large supraumbilical ventral hernia containing a segment of the transverse colon and small bowel. There is compression of the small bowel at the neck of the hernia with small bowel obstruction  CULTURES: MRSA 9/29 (-) Blood 10/2 > Trache asp 10/2 >   ANTIBIOTICS: Zosyn 10/1 >   SIGNIFICANT EVENTS: 9/30 repair of incarc hernia  LINES/TUBES: ETT 9/30 >> A line 9/30 >> 10/1 CVL 9/30 >>   DISCUSSION: Pt admitted for abdominal pain and ended up having repair of incarcerated hernia. Post op course c/b resp failure, rapid afib, AKI, metabolic acidosis, pulm edema.    ASSESSMENT / PLAN:  PULMONARY A: Acute Hypoxemic resp failure 2/2 Unable to protect airway related to surgery, acute pulmonary edema/volume overload, possible COPD Possible Underlying COPD, heavy ex-smoker P:   Cont vent support. Not ready to be extubated on 10/2 > with rapid afib, not waking up, with pulm edema Will d/c duoneb and start atrovent (2/2 rapid afib)   CARDIOVASCULAR A:  Hypertension New onset atrial fibrillation with RVR Demand ischemia Pulmonary edema P:  Cardiology following > Cardizem was dc'd 2/2 hypotension related to sedation.  HR in 120s now. Will defer to cards. Maybe restart cardizem drip.  Needs diuresis but creat is elevated now. Is 5L (+). Will d/c IVF.    RENAL A:   AKI on CKD , baseline  creatinine 1.4 range Hyperkalemia-resolved Metabolic acidosis, improved P:   Hold ARB Pt is now 5L (+), CXR with more congestion and more effusion.  Will d/c IVF. Needs diuresis.   GASTROINTESTINAL A:   SBO , incarcerated hernia, postop repair P:   Nothing by mouth Tubefeedings to start per surgery once bowel function improves  HEMATOLOGIC A:   Anemia , chronic disease + acute blood loss P:  Transfuse for Hb < 7  INFECTIOUS A:   Low gr fever + rising WBC P:   Empiric zosyn Will send for blood and trache cultures.  ENDOCRINE A:   Transient hyperglycemia, not a diabetic   P:   CBG every 4 while not on TF  NEUROLOGIC A:   Sedation for ventilation Not really waking up this am > on still some sedation though.  Pain P:   RASS goal: 0 Propofol, intermittent Fent per PAD protocol Observe mental status off sedation >> if not waking up, may need cranial ct scan.  FAMILY  - Updates: Eldest daughter updated at bedside.   - Inter-disciplinary family meet or Palliative Care meeting due by:  NA  Critical care time with this pt today : 30 minutes.   Monica Becton, MD 01/02/2016, 9:15 AM [facility] Pager [phone] After 3 pm or if no answer, call [phone]     01/02/2016, 8:55 AM

## 2016-01-02 NOTE — Progress Notes (Addendum)
Initial Nutrition Assessment  DOCUMENTATION CODES:   Obesity unspecified  INTERVENTION:    Recommend initiating nutrition support within next 24-48 hours (EN vs TPN)  NUTRITION DIAGNOSIS:   Inadequate oral intake related to inability to eat as evidenced by NPO status  GOAL:   Provide needs based on ASPEN/SCCM guidelines  MONITOR:   Vent status, Labs, Weight trends, Skin, I & O's  REASON FOR ASSESSMENT:   Ventilator  ASSESSMENT:   70 year old ex-smoker with long-standing ventral hernia admitted with nausea and vomiting and abdominal pain. She  underwent repair of incarcerated ventral hernia that was causing small bowel obstruction.  Patient s/p procedure 9/30: REPAIR INCARCERATED VENTRAL HERNIA Partial OMENTECTOMY  Patient is currently intubated on ventilator support >> NGT in place Temp (24hrs), Avg:99.3 F (37.4 C), Min:97.5 F (36.4 C), Max:100.5 F (38.1 C)  Propofol: 18.3 ml/hr >> 483 fat kcals  Surgery note reviewed. Plan to start TF within next 24 to 48 hours depending on ileus. No nutrition problems identified PTA. Labs and medications reviewed.  Diet Order:  Diet NPO time specified  Skin:  Wound (see comment) (Stage II to chest)  Last BM:  9/29   CBG (last 3)   Recent Labs  01/02/16 0343 01/02/16 0738 01/02/16 1138  GLUCAP 103* 88 111*   Height:   Ht Readings from Last 1 Encounters:  12/30/15 5\' 8"  (1.727 m)    Weight:   Wt Readings from Last 1 Encounters:  01/02/16 227 lb 8.2 oz (103.2 kg)    Ideal Body Weight:  70 kg  BMI:  Body mass index is 34.59 kg/m.  Estimated Nutritional Needs:   Kcal:  8676-7209  Protein:  140-150 gm  Fluid:  per MD  EDUCATION NEEDS:   No education needs identified at this time  Arthur Holms, RD, LDN Pager #: [phone] After-Hours Pager #: [phone]

## 2016-01-02 NOTE — Progress Notes (Signed)
Patient Name: Stephanie Griffith Date of Encounter: 01/02/2016  Primary Cardiologist: [facility] Problem List     Principal Problem:   Incarcerated hernia Active Problems:   Essential hypertension, benign   Chronic kidney disease (CKD), stage III (moderate)   Abdominal pain   Hyperglycemia   Acute on chronic renal failure (HCC)   PAD (peripheral artery disease) (HCC)   Ventral hernia   Hyperkalemia   Acute hypoxemic respiratory failure (HCC)   Acute pulmonary edema (HCC)     Subjective   Sedate on vent  Inpatient Medications    Scheduled Meds: . chlorhexidine gluconate (MEDLINE KIT)  15 mL Mouth Rinse BID  . diltiazem  20 mg Intravenous Once  . famotidine (PEPCID) IV  20 mg Intravenous Q12H  . heparin subcutaneous  5,000 Units Subcutaneous Q8H  . [START ON 01/06/2016] Influenza vac split quadrivalent PF  0.5 mL Intramuscular Tomorrow-1000  . insulin aspart  0-15 Units Subcutaneous Q4H  . ipratropium  0.5 mg Nebulization Q4H  . mouth rinse  15 mL Mouth Rinse 10 times per day  . piperacillin-tazobactam (ZOSYN)  IV  3.375 g Intravenous Q8H  . [START ON 01/06/2016] pneumococcal 23 valent vaccine  0.5 mL Intramuscular Tomorrow-1000  . sodium chloride flush  10-40 mL Intracatheter Q12H   Continuous Infusions: . diltiazem (CARDIZEM) infusion Stopped (01/01/16 1930)  . fentaNYL infusion INTRAVENOUS 100 mcg/hr (01/02/16 0700)  . phenylephrine (NEO-SYNEPHRINE) Adult infusion 25 mcg/min (01/02/16 0700)  . propofol (DIPRIVAN) infusion 15 mcg/kg/min (01/02/16 0700)   PRN Meds:.[DISCONTINUED] acetaminophen **OR** acetaminophen, fentaNYL, fentaNYL (SUBLIMAZE) injection, fentaNYL (SUBLIMAZE) injection, hydrALAZINE, HYDROmorphone (DILAUDID) injection, labetalol, morphine injection, ondansetron **OR** ondansetron (ZOFRAN) IV, sodium chloride flush   Vital Signs    Vitals:   01/02/16 0645 01/02/16 0700 01/02/16 0742 01/02/16 0748  BP: (!) 95/51 (!) 95/51    Pulse: (!) 103 (!)  103    Resp: (!) 25 (!) 25    Temp:   99.9 F (37.7 C)   TempSrc:   Axillary   SpO2: 100% 100%  100%  Weight:      Height:        Intake/Output Summary (Last 24 hours) at 01/02/16 0934 Last data filed at 01/02/16 0700  Gross per 24 hour  Intake          2357.65 ml  Output              610 ml  Net          1747.65 ml   Filed Weights   12/31/15 0325 01/01/16 0245 01/02/16 0303  Weight: 224 lb 6.9 oz (101.8 kg) 223 lb 8.7 oz (101.4 kg) 227 lb 8.2 oz (103.2 kg)    Physical Exam    GEN: On vent.  HEENT: Grossly normal.  Neck: Supple, no JVD, carotid bruits, or masses. Cardiac: Tachy reg, no murmurs, rubs, or gallops. No clubbing, cyanosis, edema.  Radials/DP/PT 2+ and equal bilaterally.  Respiratory:  Respirations regular and unlabored, clear to auscultation bilaterally. GI: hypo BS. MS: no deformity or atrophy. Skin: warm and dry, no rash. Neuro:  Unable to assess strength, sedate. Psych: sedate.  Labs    CBC  Recent Labs  01/01/16 0400 01/02/16 0340  WBC 14.3* 13.1*  HGB 9.0* 7.9*  HCT 29.7* 25.9*  MCV 96.1 93.8  PLT 220 588   Basic Metabolic Panel  Recent Labs  01/01/16 1840 01/02/16 0340  NA 137 141  K 4.5 4.3  CL 112* 114*  CO2  18* 20*  GLUCOSE 149* 108*  BUN 34* 38*  CREATININE 2.66* 2.79*  CALCIUM 7.0* 7.3*  MG  --  1.3*  PHOS  --  4.2   Liver Function Tests  Recent Labs  12/31/15 0210  AST 24  ALT 13*  ALKPHOS 132*  BILITOT 0.5  PROT 6.9  ALBUMIN 3.0*   No results for input(s): LIPASE, AMYLASE in the last 72 hours. Cardiac Enzymes  Recent Labs  12/31/15 1650  TROPONINI 0.29*   BNP Invalid input(s): POCBNP D-Dimer No results for input(s): DDIMER in the last 72 hours. Hemoglobin A1C No results for input(s): HGBA1C in the last 72 hours. Fasting Lipid Panel  Recent Labs  12/31/15 1349  TRIG 174*   Thyroid Function Tests No results for input(s): TSH, T4TOTAL, T3FREE, THYROIDAB in the last 72 hours.  Invalid input(s):  FREET3  Telemetry    Brief PAT/ PAF, now sinus rhythm, sinus tach - Personally Reviewed  ECG    AFIB RVR with TWI lateral precordial - Personally Reviewed  Radiology    Dg Chest Port 1 View  Result Date: 01/02/2016 CLINICAL DATA:  Hypoxia. EXAM: PORTABLE CHEST 1 VIEW COMPARISON:  01/01/2016. FINDINGS: Endotracheal tube, NG tube, right IJ line stable position. Cardiomegaly with bilateral pulmonary interstitial prominence and bilateral pleural effusions noted consistent with congestive heart failure. Interim worsening from prior exam. No pneumothorax . IMPRESSION: 1.  Lines and tubes in stable position. 2. Severe cardiomegaly with bilateral pulmonary interstitial prominence and bilateral pleural effusions. Findings consistent with congestive heart failure. Interim worsening from prior exam. Electronically Signed   By: Marcello Moores  Register   On: 01/02/2016 07:25   Portable Chest Xray  Result Date: 01/01/2016 CLINICAL DATA:  70 y/o F; endotracheal tube. Acute respiratory failure. EXAM: PORTABLE CHEST 1 VIEW COMPARISON:  12/31/2015 chest radiograph. FINDINGS: Endotracheal tube is slightly distracted now 5 cm from carina, possibly exaggerated by project. Right central venous catheter tip projects over mid SVC. Stable cardiomegaly. Increased attenuation of left lung and blunted left costophrenic angle probably represents a layering effusion. Interstitial pulmonary edema. Improved aeration of right upper lobe. No new focal consolidation. IMPRESSION: Stable left pleural effusion and interstitial pulmonary edema. Improved aeration of right upper lobe. Electronically Signed   By: Kristine Garbe M.D.   On: 01/01/2016 05:52   Portable Chest Xray  Result Date: 12/31/2015 CLINICAL DATA:  Acute respiratory failure. EXAM: PORTABLE CHEST 1 VIEW COMPARISON:  Radiograph of December 30, 2015. FINDINGS: Stable cardiomegaly. No pneumothorax is noted. Interval placement of nasogastric tube seen entering  stomach. Endotracheal tube is noted with distal tip 4 cm above the carina. Interval placement of right internal jugular catheter with distal tip in expected position of SVC. Mild bilateral perihilar and basilar interstitial densities are noted concerning for pulmonary edema. Probable mild left pleural effusion is noted. Right upper lobe opacity is noted concerning for possible pneumonia. Bony thorax is unremarkable. IMPRESSION: Endotracheal and nasogastric tubes in grossly good position. Right internal jugular catheter is noted with tip in expected position of SVC. Probable bilateral perihilar and basilar pulmonary edema. Probable right upper lobe pneumonia or possibly atelectasis. Electronically Signed   By: Marijo Conception, M.D.   On: 12/31/2015 14:05     Cardiac Studies   2014 - ECHO normal EF, mildly dilated LA  Patient Profile     70 year old with PAF with RVR post op.  Assessment & Plan    Atrial fibrillation  - Dr. Percival Spanish consult note reviewed  -  Brief PAF now, with majority of time now in NSR/ sinus tach  - Now off diltiazem IV (hypotension) - keep off for now.   - May use diltiazem IV PRN  Will try to avoid amiodarone (COPD)  Anemia  - worsening Hg, per primary team  AKI  - worsening renal function  - per primary team.   - IVF off (up several liters)  Acute respiratory failure  - underlying COPD as well as cardiomegaly on CXR  - may be acute systolic HF  - ordering ECHO  Signed, Candee Furbish, MD  01/02/2016, 9:34 AM

## 2016-01-02 NOTE — Progress Notes (Signed)
2 Days Post-Op  Subjective: POD#2 Remains intubated and sedated  Objective: Vital signs in last 24 hours: Temp:  [97.5 F (36.4 C)-100.5 F (38.1 C)] 99.9 F (37.7 C) (10/02 0742) Pulse Rate:  [72-168] 103 (10/02 0700) Resp:  [10-31] 25 (10/02 0700) BP: (64-152)/(43-128) 95/51 (10/02 0700) SpO2:  [100 %] 100 % (10/02 0748) FiO2 (%):  [40 %-50 %] 40 % (10/02 0854) Weight:  [103.2 kg (227 lb 8.2 oz)] 103.2 kg (227 lb 8.2 oz) (10/02 0303) Last BM Date: 12/30/15  Intake/Output from previous day: 10/01 0701 - 10/02 0700 In: 2536.1 [I.V.:2126.1; NG/GT:60; IV Piggyback:350] Out: 675 [Urine:575; Emesis/NG output:100] Intake/Output this shift: No intake/output data recorded.  Exam: Abdomen obese, soft, incision clean  Lab Results:   Recent Labs  01/01/16 0400 01/02/16 0340  WBC 14.3* 13.1*  HGB 9.0* 7.9*  HCT 29.7* 25.9*  PLT 220 194   BMET  Recent Labs  01/01/16 1840 01/02/16 0340  NA 137 141  K 4.5 4.3  CL 112* 114*  CO2 18* 20*  GLUCOSE 149* 108*  BUN 34* 38*  CREATININE 2.66* 2.79*  CALCIUM 7.0* 7.3*   PT/INR  Recent Labs  12/30/15 1340  LABPROT 13.9  INR 1.06   ABG  Recent Labs  01/01/16 0403 01/01/16 1419  PHART 7.254* 7.322*  HCO3 13.6* 16.6*    Studies/Results: Dg Chest Port 1 View  Result Date: 01/02/2016 CLINICAL DATA:  Hypoxia. EXAM: PORTABLE CHEST 1 VIEW COMPARISON:  01/01/2016. FINDINGS: Endotracheal tube, NG tube, right IJ line stable position. Cardiomegaly with bilateral pulmonary interstitial prominence and bilateral pleural effusions noted consistent with congestive heart failure. Interim worsening from prior exam. No pneumothorax . IMPRESSION: 1.  Lines and tubes in stable position. 2. Severe cardiomegaly with bilateral pulmonary interstitial prominence and bilateral pleural effusions. Findings consistent with congestive heart failure. Interim worsening from prior exam. Electronically Signed   By: Marcello Moores  Register   On: 01/02/2016  07:25   Portable Chest Xray  Result Date: 01/01/2016 CLINICAL DATA:  70 y/o F; endotracheal tube. Acute respiratory failure. EXAM: PORTABLE CHEST 1 VIEW COMPARISON:  12/31/2015 chest radiograph. FINDINGS: Endotracheal tube is slightly distracted now 5 cm from carina, possibly exaggerated by project. Right central venous catheter tip projects over mid SVC. Stable cardiomegaly. Increased attenuation of left lung and blunted left costophrenic angle probably represents a layering effusion. Interstitial pulmonary edema. Improved aeration of right upper lobe. No new focal consolidation. IMPRESSION: Stable left pleural effusion and interstitial pulmonary edema. Improved aeration of right upper lobe. Electronically Signed   By: Kristine Garbe M.D.   On: 01/01/2016 05:52   Portable Chest Xray  Result Date: 12/31/2015 CLINICAL DATA:  Acute respiratory failure. EXAM: PORTABLE CHEST 1 VIEW COMPARISON:  Radiograph of December 30, 2015. FINDINGS: Stable cardiomegaly. No pneumothorax is noted. Interval placement of nasogastric tube seen entering stomach. Endotracheal tube is noted with distal tip 4 cm above the carina. Interval placement of right internal jugular catheter with distal tip in expected position of SVC. Mild bilateral perihilar and basilar interstitial densities are noted concerning for pulmonary edema. Probable mild left pleural effusion is noted. Right upper lobe opacity is noted concerning for possible pneumonia. Bony thorax is unremarkable. IMPRESSION: Endotracheal and nasogastric tubes in grossly good position. Right internal jugular catheter is noted with tip in expected position of SVC. Probable bilateral perihilar and basilar pulmonary edema. Probable right upper lobe pneumonia or possibly atelectasis. Electronically Signed   By: Marijo Conception, M.D.   On:  12/31/2015 14:05    Anti-infectives: Anti-infectives    Start     Dose/Rate Route Frequency Ordered Stop   01/01/16 0800   piperacillin-tazobactam (ZOSYN) IVPB 3.375 g     3.375 g 12.5 mL/hr over 240 Minutes Intravenous Every 8 hours 01/01/16 0746        Assessment/Plan: s/p Procedure(s): REPAIR INCARCERATED VENTRAL HERNIA, POSSIBLE BOWEL RESECTION (N/A) Partial OMENTECTOMY  Continue NPO and NG.  Will start tube feeds in the next 24 to 48 hours depending on ileus Care per CCM  LOS: 3 days    Ramie Palladino A 01/02/2016

## 2016-01-03 ENCOUNTER — Inpatient Hospital Stay (HOSPITAL_COMMUNITY): Payer: Medicare Other

## 2016-01-03 DIAGNOSIS — L8990 Pressure ulcer of unspecified site, unspecified stage: Secondary | ICD-10-CM | POA: Insufficient documentation

## 2016-01-03 DIAGNOSIS — R0600 Dyspnea, unspecified: Secondary | ICD-10-CM

## 2016-01-03 LAB — ECHOCARDIOGRAM COMPLETE
E decel time: 161 msec
E/e' ratio: 10.08
FS: 45 % — AB (ref 28–44)
Height: 68 in
IVS/LV PW RATIO, ED: 1.2
LA ID, A-P, ES: 37 mm
LA diam end sys: 37 mm
LA diam index: 1.61 cm/m2
LA vol A4C: 66.3 ml
LA vol index: 28.8 mL/m2
LA vol: 66 mL
LV E/e' medial: 10.08
LV E/e'average: 10.08
LV PW d: 8.58 mm — AB (ref 0.6–1.1)
LV e' LATERAL: 8.81 cm/s
LVOT area: 4.15 cm2
LVOT diameter: 23 mm
MV Dec: 161
MV Peak grad: 3 mmHg
MV pk A vel: 151 m/s
MV pk E vel: 88.8 m/s
Reg peak vel: 345 cm/s
TAPSE: 27.2 mm
TDI e' lateral: 8.81
TDI e' medial: 7.94
TR max vel: 345 cm/s
Weight: 3731.95 oz

## 2016-01-03 LAB — BASIC METABOLIC PANEL
Anion gap: 9 (ref 5–15)
BUN: 39 mg/dL — ABNORMAL HIGH (ref 6–20)
CO2: 20 mmol/L — ABNORMAL LOW (ref 22–32)
Calcium: 7.7 mg/dL — ABNORMAL LOW (ref 8.9–10.3)
Chloride: 113 mmol/L — ABNORMAL HIGH (ref 101–111)
Creatinine, Ser: 2.84 mg/dL — ABNORMAL HIGH (ref 0.44–1.00)
GFR calc Af Amer: 18 mL/min — ABNORMAL LOW (ref >60)
GFR calc non Af Amer: 16 mL/min — ABNORMAL LOW (ref >60)
Glucose, Bld: 75 mg/dL (ref 65–99)
Potassium: 4.6 mmol/L (ref 3.5–5.1)
Sodium: 142 mmol/L (ref 135–145)

## 2016-01-03 LAB — GLUCOSE, CAPILLARY
Glucose-Capillary: 102 mg/dL — ABNORMAL HIGH (ref 65–99)
Glucose-Capillary: 34 mg/dL — CL (ref 65–99)
Glucose-Capillary: 72 mg/dL (ref 65–99)
Glucose-Capillary: 74 mg/dL (ref 65–99)
Glucose-Capillary: 77 mg/dL (ref 65–99)
Glucose-Capillary: 81 mg/dL (ref 65–99)
Glucose-Capillary: 83 mg/dL (ref 65–99)
Glucose-Capillary: 83 mg/dL (ref 65–99)
Glucose-Capillary: 85 mg/dL (ref 65–99)

## 2016-01-03 LAB — MAGNESIUM: Magnesium: 1.9 mg/dL (ref 1.7–2.4)

## 2016-01-03 LAB — CBC
HCT: 25.8 % — ABNORMAL LOW (ref 36.0–46.0)
Hemoglobin: 8.1 g/dL — ABNORMAL LOW (ref 12.0–15.0)
MCH: 28.9 pg (ref 26.0–34.0)
MCHC: 31.4 g/dL (ref 30.0–36.0)
MCV: 92.1 fL (ref 78.0–100.0)
Platelets: 242 10*3/uL (ref 150–400)
RBC: 2.8 MIL/uL — ABNORMAL LOW (ref 3.87–5.11)
RDW: 14 % (ref 11.5–15.5)
WBC: 13.3 10*3/uL — ABNORMAL HIGH (ref 4.0–10.5)

## 2016-01-03 LAB — PHOSPHORUS: Phosphorus: 5.4 mg/dL — ABNORMAL HIGH (ref 2.5–4.6)

## 2016-01-03 IMAGING — CR DG CHEST 1V PORT
1 series · 1 of 1 positions shown · non-contrast
Comparison: [DATE]

CLINICAL DATA: Respiratory failure

EXAM:
PORTABLE CHEST 1 VIEW

[AP]
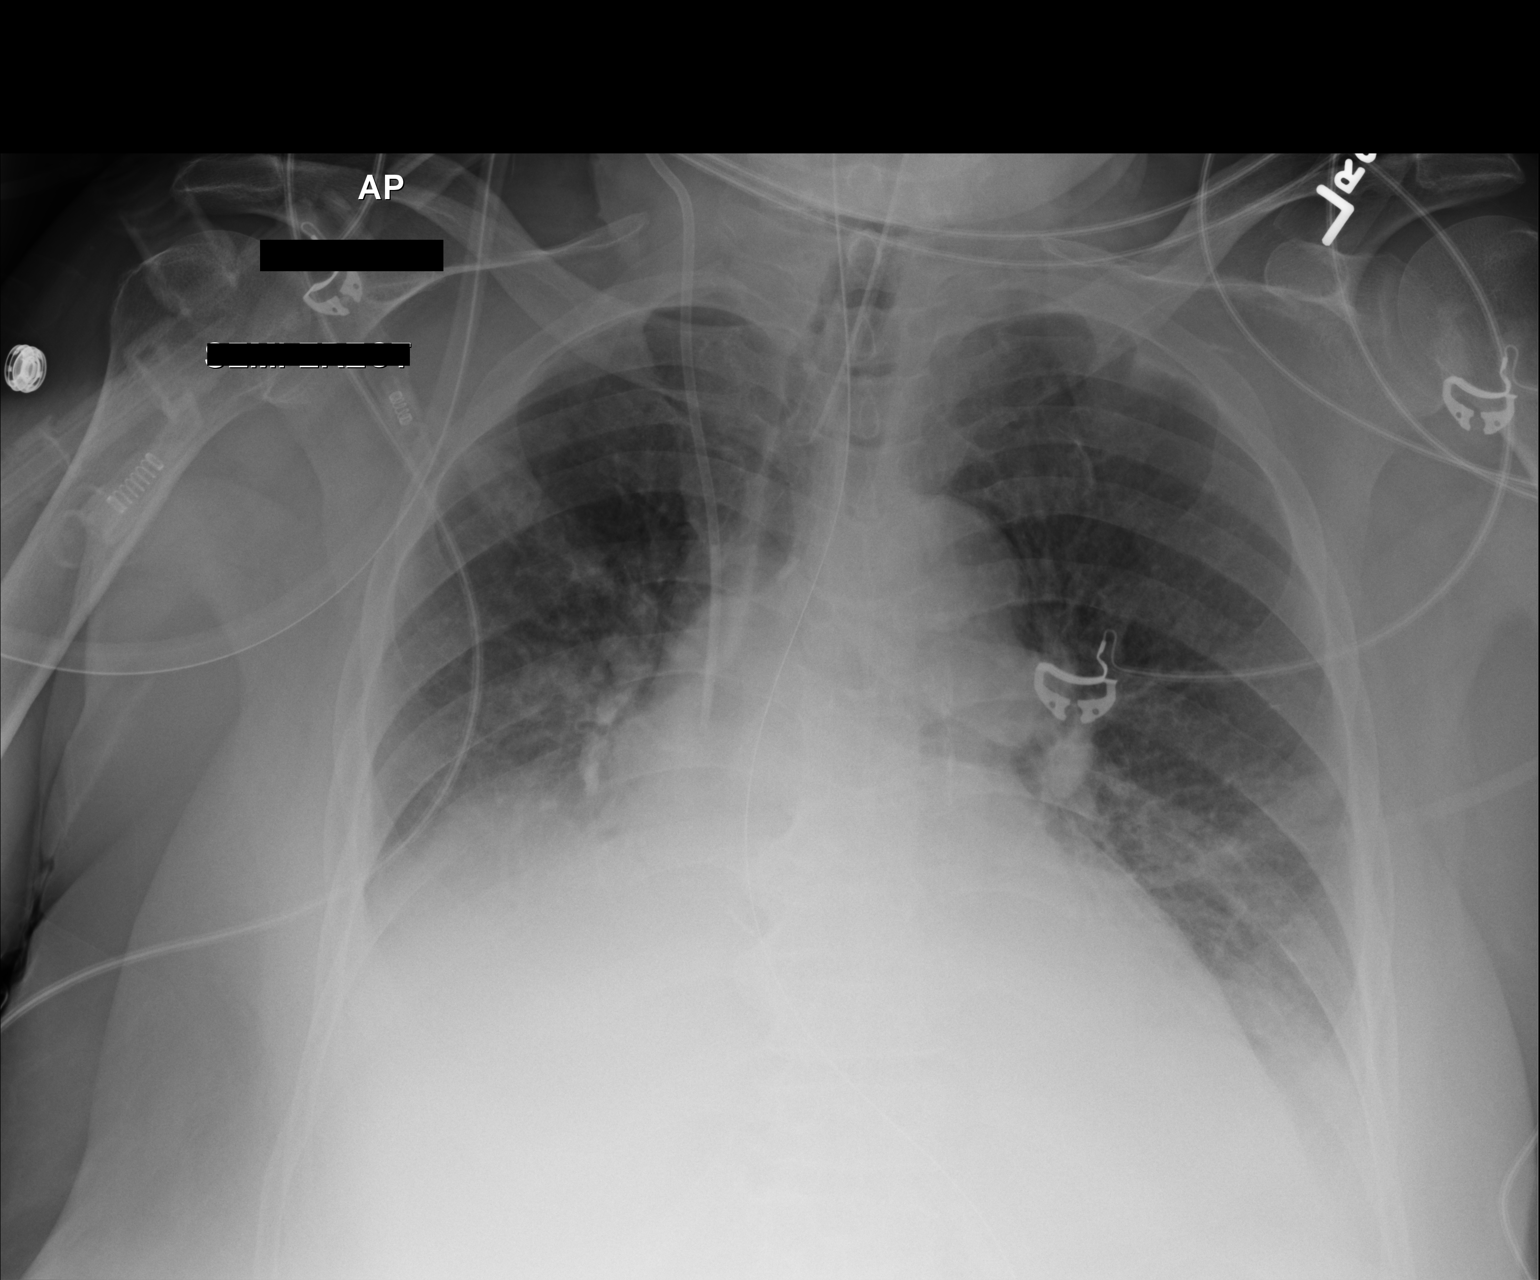

[1 of 1 positions shown; findings below may reference images not displayed]

FINDINGS: Right jugular central line, endotracheal tube and nasogastric
catheter are again seen and stable. Cardiac shadow is mildly
enlarged but stable. Small bilateral pleural effusions are again
seen. Likely basilar atelectasis is present as well. The degree of
vascular congestion has improved slightly in the interval from the
prior exam. No bony abnormality is noted.
IMPRESSION: Bibasilar atelectatic changes and effusions.

## 2016-01-03 MED ORDER — DEXMEDETOMIDINE HCL IN NACL 400 MCG/100ML IV SOLN
0.4000 ug/kg/h | INTRAVENOUS | Status: DC
  Administered 2016-01-03: 0.5 ug/kg/h via INTRAVENOUS
  Administered 2016-01-03: 0.6 ug/kg/h via INTRAVENOUS
  Administered 2016-01-03: 0.7 ug/kg/h via INTRAVENOUS
  Administered 2016-01-03: 0.5 ug/kg/h via INTRAVENOUS
  Filled 2016-01-03 (×4): qty 100

## 2016-01-03 MED ORDER — METOPROLOL TARTRATE 5 MG/5ML IV SOLN
5.0000 mg | Freq: Four times a day (QID) | INTRAVENOUS | Status: DC
Start: 2016-01-03 — End: 2016-01-04
  Administered 2016-01-03 – 2016-01-04 (×4): 5 mg via INTRAVENOUS
  Filled 2016-01-03 (×4): qty 5

## 2016-01-03 MED ORDER — DEXTROSE 50 % IV SOLN
INTRAVENOUS | Status: AC
  Administered 2016-01-03: 25 mL
  Filled 2016-01-03: qty 50

## 2016-01-03 MED ORDER — FUROSEMIDE 10 MG/ML IJ SOLN
20.0000 mg | Freq: Once | INTRAMUSCULAR | Status: AC
  Administered 2016-01-03: 20 mg via INTRAVENOUS
  Filled 2016-01-03: qty 2

## 2016-01-03 MED ORDER — DEXMEDETOMIDINE HCL IN NACL 200 MCG/50ML IV SOLN
0.4000 ug/kg/h | INTRAVENOUS | Status: DC
  Administered 2016-01-03: 1 ug/kg/h via INTRAVENOUS
  Filled 2016-01-03: qty 50

## 2016-01-03 NOTE — Progress Notes (Signed)
Inpatient Diabetes Program Recommendations  AACE/ADA: New Consensus Statement on Inpatient Glycemic Control (2015)  Target Ranges:  Prepandial:   less than 140 mg/dL      Peak postprandial:   less than 180 mg/dL (1-2 hours)      Critically ill patients:  140 - 180 mg/dL   Lab Results  Component Value Date   GLUCAP 85 01/03/2016   HGBA1C 5.3 12/29/2015    Review of Glycemic Control  Diabetes history: No prior hx DM Current orders for Inpatient glycemic control: Novolog correction 0-20 units tid  Inpatient Diabetes Program Recommendations:  Reviewed CBGs. -D/C Novolog correction  Thank you, Nani Gasser. Galina Haddox, RN, MSN, CDE Inpatient Glycemic Control Team Team Pager [phone] (8am-5pm) 01/03/2016 9:46 AM

## 2016-01-03 NOTE — Progress Notes (Addendum)
PULMONARY / CRITICAL CARE MEDICINE   Name: ANAISE STERBENZ MRN: [id number] DOB: 20-Mar-1946    ADMISSION DATE:  12/30/2015 CONSULTATION DATE:  01/03/2016  REFERRING MD:  Grandville Silos, MD  CHIEF COMPLAINT:  Postoperative vent management  HISTORY OF PRESENT ILLNESS:   70 year old ex-smoker with long-standing ventral hernia admitted with nausea and vomiting and abdominal pain. She  underwent repair of incarcerated ventral hernia that was causing small bowel obstruction. Preop showed atrial fibrillation requiring Cardizem drip and was hypertensive postoperatively, left on the ventilator, hence we're consulted. Other complicating features were AK I with hyperkalemia-for which she was treated with Kayexalate calcium and D50/insulin and hyperglycemia. She has no prior diagnosis of .  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 Awake but does not follow commands (allegedly not her baseline). More "awake" this am than 10/2. Was more tachycardic on PST.   VITAL SIGNS: BP (!) 86/51   Pulse 86   Temp 98.3 F (36.8 C) (Oral)   Resp (!) 25   Ht 5\' 8"  (1.727 m)   Wt 105.8 kg (233 lb 4 oz)   SpO2 99%   BMI 35.47 kg/m   HEMODYNAMICS:    VENTILATOR SETTINGS: Vent Mode: PRVC FiO2 (%):  [40 %] 40 % Set Rate:  [25 bmp] 25 bmp Vt Set:  [510 mL] 510 mL PEEP:  [5 cmH20] 5 cmH20 Plateau Pressure:  [20 cmH20-22 cmH20] 20 cmH20  INTAKE / OUTPUT: I/O last 3 completed shifts: In: 2437.5 [I.V.:2077.5; NG/GT:60; IV Piggyback:300] Out: 7858 [Urine:1520; Emesis/NG output:100]  PHYSICAL EXAMINATION: Gen. Elderly, obese, in distress during PST ENT - no lesions, no post nasal drip, ETT oral  Neck: Hard to assess JVD, no thyromegaly, no carotid bruits Lungs: no use of accessory muscles, no dullness to  percussion, decreased. bibasilar crackles. Cardiovascular: variable s1.  no murmurs or gallops, Gr 1 edema Abdomen: Midline incision with bandage ,soft and non-tender, no hepatosplenomegaly, BS decreased Musculoskeletal: No deformities, no cyanosis or clubbing,. (+) AKA stump Neuro: CN grossly N. (-) lateralizing signs seen.    LABS:  BMET  Recent Labs Lab 01/01/16 1723 01/01/16 1840 01/02/16 0340  NA 140 137 141  K 3.1* 4.5 4.3  CL 126* 112* 114*  CO2 11* 18* 20*  BUN 23* 34* 38*  CREATININE 1.61* 2.66* 2.79*  GLUCOSE 100* 149* 108*    Electrolytes  Recent Labs Lab 01/01/16 1723 01/01/16 1840 01/02/16 0340  CALCIUM 4.2* 7.0* 7.3*  MG  --   --  1.3*  PHOS  --   --  4.2    CBC  Recent Labs Lab 12/31/15 0210 12/31/15 0921 01/01/16 0400 01/02/16 0340  WBC 10.4  --  14.3* 13.1*  HGB 9.5* 9.9* 9.0* 7.9*  HCT 30.6* 29.0* 29.7* 25.9*  PLT 273  --  220 194    Coag's  Recent Labs Lab 12/30/15 1340  INR 1.06    Sepsis Markers  Recent Labs Lab 12/30/15 0114 12/30/15 1340 01/01/16 0400  LATICACIDVEN 1.92* 2.5* 2.0*    ABG  Recent Labs Lab 12/31/15 2316 01/01/16 0403 01/01/16 1419  PHART 7.240* 7.254* 7.322*  PCO2ART 30.0* 31.0* 32.3  PO2ART 152.0* 131.0* 105.0    Liver Enzymes  Recent Labs Lab 12/29/15 2154 12/31/15 0210  AST 23 24  ALT 15 13*  ALKPHOS 178*  132*  BILITOT 0.4 0.5  ALBUMIN 4.0 3.0*    Cardiac Enzymes  Recent Labs Lab 12/31/15 1650  TROPONINI 0.29*    Glucose  Recent Labs Lab 01/02/16 1619 01/02/16 1956 01/02/16 2356 01/03/16 0003 01/03/16 0346 01/03/16 0456  GLUCAP 99 88 56* 83 34* 102*    Imaging Ct Head Wo Contrast  Result Date: 01/02/2016 CLINICAL DATA:  Altered mental status EXAM: CT HEAD WITHOUT CONTRAST TECHNIQUE: Contiguous axial images were obtained from the base of the skull through the vertex without intravenous contrast. COMPARISON:  None. FINDINGS: Brain: The ventricles are normal in  size and configuration. There is mild invagination of CSF into the sella. There is no intracranial mass, hemorrhage, extra-axial fluid collection, or midline shift. 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 Elsewhere there is small vessel disease in the centra semiovale bilaterally. Small vessel disease is also noted in each thalamus. No acute appearing infarct is evident. Basal ganglia calcification is likely physiologic in this age group. Vascular: There is no hyperdense vessel evident. There is calcification in each carotid siphon region. Skull: The bony calvarium appears intact. Sinuses/Orbits: There is a small retention cyst in the anterior left maxillary antrum. There is mucosal thickening in several ethmoid air cells on the left, mild. Visualized paranasal sinuses elsewhere clear. Orbits appear symmetric bilaterally. Other: Mastoid air cells are clear. 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 evident. No intracranial mass, hemorrhage, or evidence of acute infarct. Mild paranasal sinus disease. Foci of arterial vascular calcification evident. Mild invagination of CSF into the sella is noted, a finding of questionable significance. Electronically Signed   By: Lowella Grip III M.D.   On: 01/02/2016 14:36   Dg Chest Port 1 View  Result Date: 01/03/2016 CLINICAL DATA:  Respiratory failure EXAM: PORTABLE CHEST 1 VIEW COMPARISON:  01/02/2016 FINDINGS: Right jugular central line, endotracheal tube and nasogastric catheter are again seen and stable. Cardiac shadow is mildly enlarged but stable. Small bilateral pleural effusions are again seen. Likely basilar atelectasis is present as well. The degree of vascular congestion has improved slightly in the interval from the  prior exam. No bony abnormality is noted. IMPRESSION: Bibasilar atelectatic changes and effusions. Electronically Signed   By: Inez Catalina M.D.   On: 01/03/2016 07:39     STUDIES:  CT abdomen 9/29  Large supraumbilical ventral hernia containing a segment of the transverse colon and small bowel. 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 Post op course c/b resp failure, rapid afib, AKI, metabolic acidosis, pulm edema.    ASSESSMENT / PLAN:  PULMONARY A: Acute Hypoxemic resp failure 2/2 Unable to protect airway related to surgery, acute pulmonary edema/volume overload, possible COPD Possible Underlying COPD, heavy ex-smoker P:   Cont vent support. Daily PST. Mental status and pulm edema main issues. Not waking up and still has volume issues. Will switch sedation  to precedex.  Trail lasix 20 mg IV x 1 > may need more diuresis if creat allows.  Cont neb meds.   CARDIOVASCULAR A:  Hypertension New onset atrial fibrillation with RVR. Now in SR, sinus tachycardia Demand ischemia Pulmonary edema P:  Cardiology following > Cardizem was dc'd 2/2 hypotension related to sedation.  HR in 120-130ss now. Will defer to cards. May use prn diltiazem for sinus tachycardia Trial lasix 20 mg IV x 1 > may need more diuresis of creat allows.    RENAL A:   AKI on CKD , baseline creatinine 1.4 range Hyperkalemia-resolved Metabolic acidosis, improved P:   Hold ARB Lasix x 1 > needs more diuresis if creat allows  GASTROINTESTINAL A:   SBO , incarcerated hernia, postop repair P:   Tubefeedings to start  per surgery once bowel function improves  HEMATOLOGIC A:   Anemia , chronic disease + acute blood loss P:  Transfuse for Hb < 7  INFECTIOUS A:   Low gr fever + rising WBC P:   Empiric zosyn F/U  blood and trache cultures.  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  Pain P:   RASS goal: 0- -1 Propofol, intermittent Fent per PAD protocol. Will try precedex this am and see if she wakes up.     FAMILY  - Updates: Eldest daughter updated at bedside on 10/2. No family around this am.   - Inter-disciplinary family meet or Palliative Care meeting due by:  10/7  Critical care time with this pt today : 30 minutes.   Monica Becton, MD 01/03/2016, 7:55 AM [facility] Pager [phone] After 3 pm or if no answer, call [phone]     01/03/2016, 7:55 AM

## 2016-01-03 NOTE — Progress Notes (Signed)
  Echocardiogram 2D Echocardiogram has been performed.  Stephanie Griffith 01/03/2016, 9:44 AM

## 2016-01-03 NOTE — Progress Notes (Signed)
Patient Name: Stephanie Griffith Date of Encounter: 01/03/2016  Primary Cardiologist: [facility] Problem List     Principal Problem:   Incarcerated hernia Active Problems:   Essential hypertension, benign   Chronic kidney disease (CKD), stage III (moderate)   Abdominal pain   Hyperglycemia   Acute on chronic renal failure (HCC)   PAD (peripheral artery disease) (HCC)   Ventral hernia   Hyperkalemia   Acute hypoxemic respiratory failure (HCC)   Acute pulmonary edema (HCC)   Pressure injury of skin     Subjective   Sedate on vent  Inpatient Medications    Scheduled Meds: . chlorhexidine gluconate (MEDLINE KIT)  15 mL Mouth Rinse BID  . diltiazem  20 mg Intravenous Once  . famotidine (PEPCID) IV  20 mg Intravenous Q24H  . heparin subcutaneous  5,000 Units Subcutaneous Q8H  . [START ON 01/06/2016] Influenza vac split quadrivalent PF  0.5 mL Intramuscular Tomorrow-1000  . insulin aspart  0-15 Units Subcutaneous Q4H  . ipratropium  0.5 mg Nebulization Q4H  . mouth rinse  15 mL Mouth Rinse 10 times per day  . piperacillin-tazobactam (ZOSYN)  IV  3.375 g Intravenous Q8H  . [START ON 01/06/2016] pneumococcal 23 valent vaccine  0.5 mL Intramuscular Tomorrow-1000  . sodium chloride flush  10-40 mL Intracatheter Q12H   Continuous Infusions: . dexmedetomidine 1 mcg/kg/hr (01/03/16 0828)  . diltiazem (CARDIZEM) infusion Stopped (01/01/16 1930)  . fentaNYL infusion INTRAVENOUS 75 mcg/hr (01/03/16 0806)  . phenylephrine (NEO-SYNEPHRINE) Adult infusion Stopped (01/03/16 0745)  . propofol (DIPRIVAN) infusion Stopped (01/03/16 0830)   PRN Meds:.[DISCONTINUED] acetaminophen **OR** acetaminophen, fentaNYL, fentaNYL (SUBLIMAZE) injection, fentaNYL (SUBLIMAZE) injection, hydrALAZINE, HYDROmorphone (DILAUDID) injection, labetalol, morphine injection, ondansetron **OR** ondansetron (ZOFRAN) IV, sodium chloride flush   Vital Signs    Vitals:   01/03/16 0700 01/03/16 0704 01/03/16  0800 01/03/16 0805  BP: (!) 86/51 (!) 86/51    Pulse: 85 86 (!) 140 (!) 140  Resp: (!) 25 (!) 25 (!) 21 18  Temp:      TempSrc:      SpO2: 99% 99% 97% 98%  Weight:      Height:        Intake/Output Summary (Last 24 hours) at 01/03/16 0936 Last data filed at 01/03/16 0800  Gross per 24 hour  Intake           630.46 ml  Output             1205 ml  Net          -574.54 ml   Filed Weights   01/01/16 0245 01/02/16 0303 01/03/16 0419  Weight: 223 lb 8.7 oz (101.4 kg) 227 lb 8.2 oz (103.2 kg) 233 lb 4 oz (105.8 kg)    Physical Exam    GEN: On vent.  HEENT: Grossly normal.  Neck: Supple, no JVD, carotid bruits, or masses. Cardiac: Tachy reg, no murmurs, rubs, or gallops. No clubbing, cyanosis, edema.  Radials/DP/PT 2+ and equal bilaterally.  Respiratory:  Respirations regular and unlabored, clear to auscultation bilaterally. GI: hypo BS. MS: no deformity or atrophy. Skin: warm and dry, no rash. Neuro:  Unable to assess strength, sedate. Psych: sedate.  Labs    CBC  Recent Labs  01/01/16 0400 01/02/16 0340  WBC 14.3* 13.1*  HGB 9.0* 7.9*  HCT 29.7* 25.9*  MCV 96.1 93.8  PLT 220 333   Basic Metabolic Panel  Recent Labs  01/01/16 1840 01/02/16 0340  NA 137 141  K 4.5 4.3  CL 112* 114*  CO2 18* 20*  GLUCOSE 149* 108*  BUN 34* 38*  CREATININE 2.66* 2.79*  CALCIUM 7.0* 7.3*  MG  --  1.3*  PHOS  --  4.2   Liver Function Tests No results for input(s): AST, ALT, ALKPHOS, BILITOT, PROT, ALBUMIN in the last 72 hours. No results for input(s): LIPASE, AMYLASE in the last 72 hours. Cardiac Enzymes  Recent Labs  12/31/15 1650  TROPONINI 0.29*   BNP Invalid input(s): POCBNP D-Dimer No results for input(s): DDIMER in the last 72 hours. Hemoglobin A1C No results for input(s): HGBA1C in the last 72 hours. Fasting Lipid Panel  Recent Labs  12/31/15 1349  TRIG 174*   Thyroid Function Tests No results for input(s): TSH, T4TOTAL, T3FREE, THYROIDAB in the  last 72 hours.  Invalid input(s): FREET3  Telemetry    Brief PAT/ PAF, now sinus rhythm, sinus tach - Personally Reviewed  ECG    AFIB RVR with TWI lateral precordial - Personally Reviewed  Radiology    Ct Head Wo Contrast  Result Date: 01/02/2016 CLINICAL DATA:  Altered mental status EXAM: CT HEAD WITHOUT CONTRAST TECHNIQUE: Contiguous axial images were obtained from the base of the skull through the vertex without intravenous contrast. COMPARISON:  None. FINDINGS: Brain: The ventricles are normal in size and configuration. There is mild invagination of CSF into the sella. There is no intracranial mass, hemorrhage, extra-axial fluid collection, or midline shift. 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 Elsewhere there is small vessel disease in the centra semiovale bilaterally. Small vessel disease is also noted in each thalamus. No acute appearing infarct is evident. Basal ganglia calcification is likely physiologic in this age group. Vascular: There is no hyperdense vessel evident. There is calcification in each carotid siphon region. Skull: The bony calvarium appears intact. Sinuses/Orbits: There is a small retention cyst in the anterior left maxillary antrum. There is mucosal thickening in several ethmoid air cells on the left, mild. Visualized paranasal sinuses elsewhere clear. Orbits appear symmetric bilaterally. Other: Mastoid air cells are clear. 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 evident. No intracranial mass, hemorrhage, or evidence of acute infarct. Mild paranasal sinus disease. Foci of arterial vascular calcification evident. Mild invagination of CSF into the sella is noted, a finding of questionable significance. Electronically Signed   By:  Lowella Grip III M.D.   On: 01/02/2016 14:36   Dg Chest Port 1 View  Result Date: 01/03/2016 CLINICAL DATA:  Respiratory failure EXAM: PORTABLE CHEST 1 VIEW COMPARISON:  01/02/2016 FINDINGS: Right jugular central line, endotracheal tube and nasogastric catheter are again seen and stable. Cardiac shadow is mildly enlarged but stable. Small bilateral pleural effusions are again seen. Likely basilar atelectasis is present as well. The degree of vascular congestion has improved slightly in the interval from the prior exam. No bony abnormality is noted. IMPRESSION: Bibasilar atelectatic changes and effusions. Electronically Signed   By: Inez Catalina M.D.   On: 01/03/2016 07:39   Dg Chest Port 1 View  Result Date: 01/02/2016 CLINICAL DATA:  Hypoxia. EXAM: PORTABLE CHEST 1 VIEW COMPARISON:  01/01/2016. FINDINGS: Endotracheal tube, NG tube, right IJ line stable position. Cardiomegaly with bilateral pulmonary interstitial prominence and bilateral pleural effusions noted consistent with congestive heart failure. Interim worsening from prior exam. No  pneumothorax . IMPRESSION: 1.  Lines and tubes in stable position. 2. Severe cardiomegaly with bilateral pulmonary interstitial prominence and bilateral pleural effusions. Findings consistent with congestive heart failure. Interim worsening from prior exam. Electronically Signed   By: Marcello Moores  Register   On: 01/02/2016 07:25     Cardiac Studies   2014 - ECHO normal EF, mildly dilated LA  Patient Profile     70 year old with PAF with RVR post op.  Assessment & Plan    Atrial fibrillation  - Dr. Percival Spanish consult note reviewed  - Brief PAT now, with majority of time now in NSR/ sinus tach  - Now off diltiazem IV (hypotension) - keep off for now.   - Will try to avoid amiodarone (COPD)  - EF on ECHO normal/ hyperdynamic.   - Would like to try low dose metoprolol 23m IV Q6 to aid with hyperdynamic contraction and PAT.   Anemia  - per primary  team  AKI  - worsening renal function  - per primary team.   - IVF off (up several liters)  Acute respiratory failure  - underlying COPD   - Reassuring EF  Signed, MCandee Furbish MD  01/03/2016, 9:36 AM

## 2016-01-03 NOTE — Significant Event (Signed)
Wake up assessment performed with continuous sedation turned off. Patient woke up, not following commands at all, moves extremities on her own. Was restless and agitated; HR up to 170s, BP 150s. Continuous sedation turned back on, precedex to be started.

## 2016-01-03 NOTE — Progress Notes (Signed)
3 Days Post-Op  Subjective: Still on vent  Objective: Vital signs in last 24 hours: Temp:  [97.6 F (36.4 C)-98.6 F (37 C)] 97.6 F (36.4 C) (10/03 1130) Pulse Rate:  [30-140] 79 (10/03 1330) Resp:  [0-27] 25 (10/03 1330) BP: (54-147)/(42-114) 132/72 (10/03 1330) SpO2:  [92 %-100 %] 97 % (10/03 1330) FiO2 (%):  [40 %] 40 % (10/03 1330) Weight:  [105.8 kg (233 lb 4 oz)] 105.8 kg (233 lb 4 oz) (10/03 0419) Last BM Date: 12/30/15  Intake/Output from previous day: 10/02 0701 - 10/03 0700 In: 669.3 [I.V.:569.3; IV Piggyback:100] Out: 1175 [Urine:1175] Intake/Output this shift: Total I/O In: 258.7 [I.V.:178.7; NG/GT:30; IV Piggyback:50] Out: 62 [Urine:800; Emesis/NG output:20]  Exam: Abdomen soft, obese, but quiet Lab Results:   Recent Labs  01/02/16 0340 01/03/16 0903  WBC 13.1* 13.3*  HGB 7.9* 8.1*  HCT 25.9* 25.8*  PLT 194 242   BMET  Recent Labs  01/02/16 0340 01/03/16 0903  NA 141 142  K 4.3 4.6  CL 114* 113*  CO2 20* 20*  GLUCOSE 108* 75  BUN 38* 39*  CREATININE 2.79* 2.84*  CALCIUM 7.3* 7.7*   PT/INR No results for input(s): LABPROT, INR in the last 72 hours. ABG  Recent Labs  01/01/16 0403 01/01/16 1419  PHART 7.254* 7.322*  HCO3 13.6* 16.6*    Studies/Results: Ct Head Wo Contrast  Result Date: 01/02/2016 CLINICAL DATA:  Altered mental status EXAM: CT HEAD WITHOUT CONTRAST TECHNIQUE: Contiguous axial images were obtained from the base of the skull through the vertex without intravenous contrast. COMPARISON:  None. FINDINGS: Brain: The ventricles are normal in size and configuration. There is mild invagination of CSF into the sella. There is no intracranial mass, hemorrhage, extra-axial fluid collection, or midline shift. 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 Elsewhere there is small vessel disease in  the centra semiovale bilaterally. Small vessel disease is also noted in each thalamus. No acute appearing infarct is evident. Basal ganglia calcification is likely physiologic in this age group. Vascular: There is no hyperdense vessel evident. There is calcification in each carotid siphon region. Skull: The bony calvarium appears intact. Sinuses/Orbits: There is a small retention cyst in the anterior left maxillary antrum. There is mucosal thickening in several ethmoid air cells on the left, mild. Visualized paranasal sinuses elsewhere clear. Orbits appear symmetric bilaterally. Other: Mastoid air cells are clear. 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 evident. No intracranial mass, hemorrhage, or evidence of acute infarct. Mild paranasal sinus disease. Foci of arterial vascular calcification evident. Mild invagination of CSF into the sella is noted, a finding of questionable significance. Electronically Signed   By: Lowella Grip III M.D.   On: 01/02/2016 14:36   Dg Chest Port 1 View  Result Date: 01/03/2016 CLINICAL DATA:  Respiratory failure EXAM: PORTABLE CHEST 1 VIEW COMPARISON:  01/02/2016 FINDINGS: Right jugular central line, endotracheal tube and nasogastric catheter are again seen and stable. Cardiac shadow is mildly enlarged but stable. Small bilateral pleural effusions are again seen. Likely basilar atelectasis is present as well. The degree of vascular congestion has improved slightly in the interval from the prior exam. No bony abnormality is noted. IMPRESSION: Bibasilar atelectatic changes and effusions. Electronically Signed   By: Inez Catalina M.D.   On: 01/03/2016 07:39  Dg Chest Port 1 View  Result Date: 01/02/2016 CLINICAL DATA:  Hypoxia. EXAM: PORTABLE CHEST 1 VIEW COMPARISON:  01/01/2016. FINDINGS: Endotracheal tube, NG tube, right IJ line stable position. Cardiomegaly with  bilateral pulmonary interstitial prominence and bilateral pleural effusions noted consistent with congestive heart failure. Interim worsening from prior exam. No pneumothorax . IMPRESSION: 1.  Lines and tubes in stable position. 2. Severe cardiomegaly with bilateral pulmonary interstitial prominence and bilateral pleural effusions. Findings consistent with congestive heart failure. Interim worsening from prior exam. Electronically Signed   By: [place]   On: 01/02/2016 07:25    Anti-infectives: Anti-infectives    Start     Dose/Rate Route Frequency Ordered Stop   01/01/16 0800  piperacillin-tazobactam (ZOSYN) IVPB 3.375 g     3.375 g 12.5 mL/hr over 240 Minutes Intravenous Every 8 hours 01/01/16 0746        Assessment/Plan: s/p Procedure(s): REPAIR INCARCERATED VENTRAL HERNIA, POSSIBLE BOWEL RESECTION (N/A) Partial OMENTECTOMY  Will hold on tube feeds until tomorrow  LOS: 4 days    Verona Hartshorn A 01/03/2016

## 2016-01-03 NOTE — Significant Event (Signed)
A full bag of fentanyl that has not been open today is returned to main pharmacy. RN handed over fentanyl bag to pharmacy tech Ignatius Specking.

## 2016-01-04 DIAGNOSIS — I998 Other disorder of circulatory system: Secondary | ICD-10-CM

## 2016-01-04 DIAGNOSIS — I10 Essential (primary) hypertension: Secondary | ICD-10-CM

## 2016-01-04 LAB — TYPE AND SCREEN
ABO/RH(D): B POS
Antibody Screen: NEGATIVE
Unit division: 0
Unit division: 0

## 2016-01-04 LAB — GLUCOSE, CAPILLARY
Glucose-Capillary: 73 mg/dL (ref 65–99)
Glucose-Capillary: 73 mg/dL (ref 65–99)
Glucose-Capillary: 76 mg/dL (ref 65–99)
Glucose-Capillary: 82 mg/dL (ref 65–99)
Glucose-Capillary: 90 mg/dL (ref 65–99)
Glucose-Capillary: 91 mg/dL (ref 65–99)

## 2016-01-04 LAB — CULTURE, RESPIRATORY: Culture: NORMAL

## 2016-01-04 LAB — BASIC METABOLIC PANEL
Anion gap: 12 (ref 5–15)
BUN: 41 mg/dL — ABNORMAL HIGH (ref 6–20)
CO2: 17 mmol/L — ABNORMAL LOW (ref 22–32)
Calcium: 8.1 mg/dL — ABNORMAL LOW (ref 8.9–10.3)
Chloride: 110 mmol/L (ref 101–111)
Creatinine, Ser: 2.65 mg/dL — ABNORMAL HIGH (ref 0.44–1.00)
GFR calc Af Amer: 20 mL/min — ABNORMAL LOW (ref >60)
GFR calc non Af Amer: 17 mL/min — ABNORMAL LOW (ref >60)
Glucose, Bld: 92 mg/dL (ref 65–99)
Potassium: 5 mmol/L (ref 3.5–5.1)
Sodium: 139 mmol/L (ref 135–145)

## 2016-01-04 LAB — CBC
HCT: 27.4 % — ABNORMAL LOW (ref 36.0–46.0)
Hemoglobin: 8.5 g/dL — ABNORMAL LOW (ref 12.0–15.0)
MCH: 28.6 pg (ref 26.0–34.0)
MCHC: 31 g/dL (ref 30.0–36.0)
MCV: 92.3 fL (ref 78.0–100.0)
Platelets: 271 10*3/uL (ref 150–400)
RBC: 2.97 MIL/uL — ABNORMAL LOW (ref 3.87–5.11)
RDW: 13.6 % (ref 11.5–15.5)
WBC: 16 10*3/uL — ABNORMAL HIGH (ref 4.0–10.5)

## 2016-01-04 LAB — TRIGLYCERIDES: Triglycerides: 169 mg/dL — ABNORMAL HIGH (ref <150)

## 2016-01-04 LAB — PHOSPHORUS: Phosphorus: 5.6 mg/dL — ABNORMAL HIGH (ref 2.5–4.6)

## 2016-01-04 LAB — MAGNESIUM: Magnesium: 1.9 mg/dL (ref 1.7–2.4)

## 2016-01-04 LAB — CULTURE, RESPIRATORY W GRAM STAIN: Gram Stain: NONE SEEN

## 2016-01-04 MED ORDER — HEPARIN (PORCINE) IN NACL 100-0.45 UNIT/ML-% IJ SOLN
1200.0000 [IU]/h | INTRAMUSCULAR | Status: DC
  Administered 2016-01-04: 1250 [IU]/h via INTRAVENOUS
  Administered 2016-01-05: 1050 [IU]/h via INTRAVENOUS
  Administered 2016-01-06: 1000 [IU]/h via INTRAVENOUS
  Filled 2016-01-04 (×5): qty 250

## 2016-01-04 MED ORDER — IPRATROPIUM BROMIDE 0.02 % IN SOLN
0.5000 mg | Freq: Four times a day (QID) | RESPIRATORY_TRACT | Status: DC
  Administered 2016-01-04 – 2016-01-06 (×9): 0.5 mg via RESPIRATORY_TRACT
  Filled 2016-01-04 (×9): qty 2.5

## 2016-01-04 MED ORDER — VITAL HIGH PROTEIN PO LIQD
1000.0000 mL | ORAL | Status: DC
  Administered 2016-01-04: 12:00:00
  Filled 2016-01-04 (×2): qty 1000

## 2016-01-04 MED ORDER — ORAL CARE MOUTH RINSE
15.0000 mL | Freq: Four times a day (QID) | OROMUCOSAL | Status: DC
  Administered 2016-01-05 (×3): 15 mL via OROMUCOSAL

## 2016-01-04 MED ORDER — MAGNESIUM SULFATE 2 GM/50ML IV SOLN
2.0000 g | Freq: Once | INTRAVENOUS | Status: AC
  Administered 2016-01-04: 2 g via INTRAVENOUS
  Filled 2016-01-04: qty 50

## 2016-01-04 MED ORDER — METOPROLOL TARTRATE 5 MG/5ML IV SOLN
10.0000 mg | Freq: Four times a day (QID) | INTRAVENOUS | Status: DC
  Administered 2016-01-04 – 2016-01-09 (×19): 10 mg via INTRAVENOUS
  Filled 2016-01-04 (×19): qty 10

## 2016-01-04 NOTE — Progress Notes (Addendum)
Daughter pointed out to RN that patient's fingers on left hand that are discolored are worse than previous. First and last digits/tips are blackened in color and third and fourth digits are purple in color.  Daughter stated that they did not look like that yesterday.  Dr. Oletta Darter notified in [place] and made aware of above finding.   Second daughter "[name]" came in and stated that they were becoming discolored over the weekend and she noticed it.  Dr. Oletta Darter asked RN to dopple palmer/radial/ulner pulses and to notify surgeons as well.  Dr. Hulen Skains notified and will come over.

## 2016-01-04 NOTE — Progress Notes (Signed)
[place] Progress Note Patient Name: Stephanie Griffith DOB: 1945-04-17 MRN: [id number]   Date of Service  01/04/2016  HPI/Events of Note  Called by bedside nurse due to black finger tips index finger and 5th finger and purple finger tips on L 3rd and forth fingers. Patient had A-line in the L radial artery from 12/31/2015 to 01/01/2016 when the A-line was removed.   eICU Interventions  Will order: 1. Nurse asked to doppler L radial artery , ulner artery and L ulnar arch.  2. Nurse also asked to notify the general surgeon of the black/purple finger tips.      Intervention Category Intermediate Interventions: Other:  Oria Klimas Cornelia Copa 01/04/2016, 5:09 PM

## 2016-01-04 NOTE — Progress Notes (Signed)
ANTICOAGULATION CONSULT NOTE - Initial Consult  Pharmacy Consult for heparin Indication: ischemic left upper extremity  No Known Allergies  Patient Measurements: Height: 5\' 8"  (172.7 cm) Weight: 225 lb 12 oz (102.4 kg) IBW/kg (Calculated) : 63.9 Heparin Dosing Weight: 86kg  Vital Signs: Temp: 98.2 F (36.8 C) (10/04 1500) Temp Source: Oral (10/04 1500) BP: 120/72 (10/04 1700) Pulse Rate: 91 (10/04 1700)  Labs:  Recent Labs  01/02/16 0340 01/03/16 0903 01/04/16 0320  HGB 7.9* 8.1* 8.5*  HCT 25.9* 25.8* 27.4*  PLT 194 242 271  CREATININE 2.79* 2.84* 2.65*    Estimated Creatinine Clearance: 24.7 mL/min (by C-G formula based on SCr of 2.65 mg/dL (H)).  Assessment: 9 YOF admitted for hernia repair, now with ischemia of left distal fingertips. To start IV heparin without bolus and have duplex performed tomorrow. She is not on anticoagulation prior to admission and last received a dose of VTE prophylaxis heparin at 1400 this afternoon.  Hgb  8.5, plts 271- has been stable over admission.  Goal of Therapy:  Heparin level 0.3-0.7 units/ml Monitor platelets by anticoagulation protocol: Yes   Plan:  -start heparin at 1250 units/hr NO BOLUS -first heparin level with morning labs -daily heparin level and CBC -follow vascular plans  Collie Wernick D. Koby Hartfield, PharmD, BCPS Clinical Pharmacist Pager: [phone] 01/04/2016 6:49 PM

## 2016-01-04 NOTE — Procedures (Signed)
Extubation Procedure Note  Patient Details:   Name: Stephanie Griffith DOB: 1945/06/27 MRN: [id number]   Airway Documentation: Extubated pt to 4 lpm [name] Weak cough but able to state her full name and where she is located.  Will continue to monitor pt. IS instruction given.    Evaluation  O2 sats: stable throughout Complications: No apparent complications Patient did tolerate procedure well. Bilateral Breath Sounds: Clear, Diminished   Yes  Ned Grace 01/04/2016, 2:06 PM

## 2016-01-04 NOTE — Care Management Important Message (Signed)
Important Message  Patient Details  Name: Stephanie Griffith MRN: [id number] Date of Birth: Aug 14, 1945   Medicare Important Message Given:  Yes    Nathen May 01/04/2016, 10:31 AM

## 2016-01-04 NOTE — Progress Notes (Signed)
Nutrition Consult/Follow Up  DOCUMENTATION CODES:   Obesity unspecified  INTERVENTION:    Initiate Vital High Protein formula at trickle rate of 20 ml/hr  NUTRITION DIAGNOSIS:   Inadequate oral intake related to inability to eat as evidenced by NPO status, ongoing   GOAL:   Patient will meet greater than or equal to 90% of their needs, progressing  MONITOR:   TF tolerance, Vent status, Labs, Weight trends, Skin, I & O's  ASSESSMENT:   70 year old ex-smoker with long-standing ventral hernia admitted with nausea and vomiting and abdominal pain. She  underwent repair of incarcerated ventral hernia that was causing small bowel obstruction.  Patient s/p procedure 9/30: REPAIR INCARCERATED VENTRAL HERNIA Partial OMENTECTOMY  Patient extubated today 1407. RD spoke with Dr. Ninfa Linden. Plan is to start trickle TF via NGT. Labs and medications reviewed.  Diet Order:  Diet NPO time specified  Skin:  Wound (see comment) (Stage II to chest)  Last BM:  9/29   CBG (last 3)   Recent Labs  01/04/16 0407 01/04/16 0801 01/04/16 1158  GLUCAP 76 90 91   Height:   Ht Readings from Last 1 Encounters:  12/30/15 5\' 8"  (1.727 m)    Weight:   Wt Readings from Last 1 Encounters:  01/04/16 225 lb 12 oz (102.4 kg)    Ideal Body Weight:  70 kg  BMI:  Body mass index is 34.33 kg/m.  Estimated Nutritional Needs:   Kcal:  2000-2200  Protein:  120-130 gm  Fluid:  per MD  EDUCATION NEEDS:   No education needs identified at this time  Arthur Holms, RD, LDN Pager #: [phone] After-Hours Pager #: [phone]

## 2016-01-04 NOTE — Consult Note (Signed)
Hospital Consult    Reason for Consult:  Ischemic left hand Referring Physician:  Dr. Hulen Skains general surgery MRN #:  [id number]  History of Present Illness: This is a 70 y.o. female previously underwent left aka by [facility] in 2013. She is now s/p hernia repair for incarcerated bowel with sbo. At she had a radial arterial line that was reportedly removed last night. At some point her daughter noticed duskiness of the distal tips of her left hand. She is left handed. She does not have any complaints of pain or numbness in her left hand at this time. She is disoriented to place and time. She had an episode of atrial fibrillation during this hospital stay but is now in sinus rhythm. Echo performed yesterday.  Past Medical History:  Diagnosis Date  . Chronic renal insufficiency    Cr 1.38-1.93 in 2009  . Hypercholesteremia   . Hyperglycemia   . Hypertension   . PVD (peripheral vascular disease) ([place])    s/p LAKA  . Umbilical hernia     Past Surgical History:  Procedure Laterality Date  . AMPUTATION  06/06/2011   Procedure: AMPUTATION DIGIT;  Surgeon: Elam Dutch, MD;  Location: [facility] OR;  Service: Vascular;  Laterality: Left;  Transmetatarsal amputation left great toe  . AMPUTATION  06/12/2011   Procedure: AMPUTATION ABOVE KNEE;  Surgeon: Elam Dutch, MD;  Location: [place];  Service: Vascular;  Laterality: Left;  . FEMORAL-POPLITEAL BYPASS GRAFT  06/06/2011   Procedure: BYPASS GRAFT FEMORAL-POPLITEAL ARTERY;  Surgeon: Elam Dutch, MD;  Location: [facility] OR;  Service: Vascular;  Laterality: Left;  left femoral to popliteal bypass with composite vein and propaten 11mm x 80 graft  . LOWER EXTREMITY ANGIOGRAM N/A 06/04/2011   Procedure: LOWER EXTREMITY ANGIOGRAM;  Surgeon: Angelia Mould, MD;  Location: [facility] CATH LAB;  Service: Cardiovascular;  Laterality: N/A;  . OMENTECTOMY  12/31/2015   Procedure: Partial OMENTECTOMY;  Surgeon: Georganna Skeans, MD;  Location: [place];  Service: General;;  .  TUBAL LIGATION    . VENTRAL HERNIA REPAIR N/A 12/31/2015   Procedure: REPAIR INCARCERATED VENTRAL HERNIA, POSSIBLE BOWEL RESECTION;  Surgeon: Georganna Skeans, MD;  Location: [place];  Service: General;  Laterality: N/A;    No Known Allergies  Prior to Admission medications   Medication Sig Start Date End Date Taking? Authorizing Provider  amLODipine (NORVASC) 5 MG tablet Take 10 mg by mouth daily.  07/08/14  Yes Historical Provider, MD  aspirin 81 MG EC tablet Take 81 mg by mouth daily.     Yes Historical Provider, MD  atorvastatin (LIPITOR) 40 MG tablet Take 40 mg by mouth daily.   Yes Historical Provider, MD  carvedilol (COREG) 25 MG tablet Take 25 mg by mouth 2 (two) times daily with a meal.   Yes Historical Provider, MD  famotidine (PEPCID) 20 MG tablet Take 20 mg by mouth 2 (two) times daily.   Yes Historical Provider, MD  gabapentin (NEURONTIN) 100 MG capsule Take 100 mg by mouth 2 (two) times daily.  06/23/14  Yes Historical Provider, MD  olmesartan-hydrochlorothiazide (BENICAR HCT) 40-25 MG per tablet Take 1 tablet by mouth daily.   Yes Historical Provider, MD    Social History   Social History  . Marital status: Married    Spouse name: N/A  . Number of children: N/A  . Years of education: N/A   Occupational History  . Not on file.   Social History Main Topics  . Smoking status: Former  Smoker    Packs/day: 2.00    Years: 50.00    Quit date: 06/01/2011  . Smokeless tobacco: Never Used  . Alcohol use No  . Drug use: No  . Sexual activity: Not Currently   Other Topics Concern  . Not on file   Social History Narrative  . No narrative on file     Family History  Problem Relation Age of Onset  . Heart attack Father   . Heart disease Father   . Diabetes Mother   . Hypertension Mother   . Heart attack Brother     ROS: [x]  Positive   [ ]  Negative   [ ]  All sytems reviewed and are negative Cardiovascular: []  chest pain/pressure []  palpitations []  SOB lying flat []   DOE []  pain in legs while walking []  pain in legs at rest []  pain in legs at night [x]  non-healing ulcers, history of []  hx of DVT []  swelling in legs  Pulmonary: []  productive cough []  asthma/wheezing []  home O2  Neurologic: []  weakness in []  arms []  legs []  numbness in []  arms []  legs []  hx of CVA []  mini stroke [] difficulty speaking or slurred speechs  Hematologic: []  hx of cancer []  bleeding problems []  problems with blood clotting easily  Endocrine:   [x]  diabetes []  thyroid disease  GI []  vomiting blood []  blood in stool  GU: [x]  CKD []  burning with urination []  blood in urine  Psychiatric: []  anxiety []  depression  Musculoskeletal: []  arthritis []  joint pain  Integumentary: []  rashes []  ulcers  Constitutional: []  fever []  chills   Physical Examination  Vitals:   01/04/16 1600 01/04/16 1700  BP: 138/66 120/72  Pulse: (!) 101 91  Resp: (!) 28 16  Temp:     Body mass index is 34.33 kg/m.  General:  Disoriented, in nad Gait: Not observed HENT: WNL, normocephalic Pulmonary: normal non-labored breathing Cardiac: in sinus rhythm now, radial and ulnar arteries on left with signal multiphasic at ulnar and monophasic at radial Abdomen: soft\ Extremities: left distal digital ischemia noted of 3 fingers without cap refill,  Neurologic: grip strength 3/5 on left, 5/5 on right  CBC    Component Value Date/Time   WBC 16.0 (H) 01/04/2016 0320   RBC 2.97 (L) 01/04/2016 0320   HGB 8.5 (L) 01/04/2016 0320   HCT 27.4 (L) 01/04/2016 0320   PLT 271 01/04/2016 0320   MCV 92.3 01/04/2016 0320   MCH 28.6 01/04/2016 0320   MCHC 31.0 01/04/2016 0320   RDW 13.6 01/04/2016 0320   LYMPHSABS 1.0 12/29/2015 2154   MONOABS 0.5 12/29/2015 2154   EOSABS 0.0 12/29/2015 2154   BASOSABS 0.0 12/29/2015 2154    BMET    Component Value Date/Time   NA 139 01/04/2016 0320   K 5.0 01/04/2016 0320   CL 110 01/04/2016 0320   CO2 17 (L) 01/04/2016 0320    GLUCOSE 92 01/04/2016 0320   BUN 41 (H) 01/04/2016 0320   CREATININE 2.65 (H) 01/04/2016 0320   CALCIUM 8.1 (L) 01/04/2016 0320   GFRNONAA 17 (L) 01/04/2016 0320   GFRAA 20 (L) 01/04/2016 0320    COAGS: Lab Results  Component Value Date   INR 1.06 12/30/2015   INR 1.13 06/04/2011      ASSESSMENT/PLAN: This is a 70 y.o. female admitted with incarcerated ventral hernia now s/p repair. She was extubated today and now has ischemia of multiple left distal fingertips with palmar signal and sensory in tact with some  motor weakness. Given the likely chronicity will start heparin gtt and get duplex of LUE tomorrow since she has elevated Cr. Echo was performed yesterday and did not demonstrate any intracardiac masses or shunts. Hold tube feeds after midnight.   Macedonio Scallon C. Donzetta Matters, MD [facility] Office: [phone] Pager: [phone]

## 2016-01-04 NOTE — Progress Notes (Signed)
PULMONARY / CRITICAL CARE MEDICINE   Name: Stephanie Griffith MRN: [id number] DOB: 1945/07/17    ADMISSION DATE:  12/30/2015 CONSULTATION DATE:  01/04/2016  REFERRING MD:  Grandville Silos, MD  CHIEF COMPLAINT:  Postoperative vent management  HISTORY OF PRESENT ILLNESS:   70 year old ex-smoker with long-standing ventral hernia admitted with nausea and vomiting and abdominal pain. She  underwent repair of incarcerated ventral hernia that was causing small bowel obstruction. Preop showed atrial fibrillation requiring Cardizem drip and was hypertensive postoperatively, left on the ventilator, hence we're consulted. Other complicating features were AK I with hyperkalemia-for which she was treated with Kayexalate calcium and D50/insulin and hyperglycemia. She has no prior diagnosis of .  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 Awake but does not follow commands (allegedly not her baseline). More "awake" this am than 10/2. Was more tachycardic on PST.   VITAL SIGNS: BP (!) 111/93   Pulse 70   Temp (!) 96.1 F (35.6 C) (Axillary)   Resp (!) 9   Ht 5\' 8"  (1.727 m)   Wt 102.4 kg (225 lb 12 oz)   SpO2 100%   BMI 34.33 kg/m   HEMODYNAMICS:    VENTILATOR SETTINGS: Vent Mode: PSV;CPAP FiO2 (%):  [40 %] 40 % Set Rate:  [25 bmp] 25 bmp Vt Set:  [510 mL] 510 mL PEEP:  [5 cmH20] 5 cmH20 Pressure Support:  [5 cmH20] 5 cmH20 Plateau Pressure:  [18 cmH20-21 cmH20] 18 cmH20  INTAKE / OUTPUT: I/O last 3 completed shifts: In: 1140.2 [I.V.:847.7; NG/GT:30; IV Piggyback:262.5] Out: 2229 [NLGXQ:1194; Emesis/NG output:95]  PHYSICAL EXAMINATION: Gen. Elderly, obese, in distress during PST ENT - no lesions, no post nasal drip, ETT oral  Neck: Hard to assess JVD, no thyromegaly, no carotid  bruits Lungs: no use of accessory muscles, no dullness to percussion, decreased. bibasilar crackles. Cardiovascular: variable s1.  no murmurs or gallops, Gr 1 edema Abdomen: Midline incision with bandage ,soft and non-tender, no hepatosplenomegaly, BS decreased Musculoskeletal: No deformities, no cyanosis or clubbing,. (+) AKA stump Neuro: CN grossly N. (-) lateralizing signs seen.   LABS:  BMET  Recent Labs Lab 01/02/16 0340 01/03/16 0903 01/04/16 0320  NA 141 142 139  K 4.3 4.6 5.0  CL 114* 113* 110  CO2 20* 20* 17*  BUN 38* 39* 41*  CREATININE 2.79* 2.84* 2.65*  GLUCOSE 108* 75 92   Electrolytes  Recent Labs Lab 01/02/16 0340 01/03/16 0903 01/04/16 0320  CALCIUM 7.3* 7.7* 8.1*  MG 1.3* 1.9 1.9  PHOS 4.2 5.4* 5.6*   CBC  Recent Labs Lab 01/02/16 0340 01/03/16 0903 01/04/16 0320  WBC 13.1* 13.3* 16.0*  HGB 7.9* 8.1* 8.5*  HCT 25.9* 25.8* 27.4*  PLT 194 242 271   Coag's  Recent Labs Lab 12/30/15 1340  INR 1.06   Sepsis Markers  Recent Labs Lab 12/30/15 0114 12/30/15 1340 01/01/16 0400  LATICACIDVEN 1.92* 2.5* 2.0*   ABG  Recent Labs Lab 12/31/15 2316 01/01/16 0403 01/01/16 1419  PHART 7.240* 7.254* 7.322*  PCO2ART 30.0* 31.0* 32.3  PO2ART 152.0* 131.0* 105.0   Liver Enzymes  Recent Labs Lab 12/29/15 2154 12/31/15 0210  AST 23 24  ALT 15 13*  ALKPHOS 178* 132*  BILITOT 0.4 0.5  ALBUMIN 4.0 3.0*   Cardiac Enzymes  Recent Labs Lab  12/31/15 1650  TROPONINI 0.29*   Glucose  Recent Labs Lab 01/03/16 1228 01/03/16 1633 01/03/16 1934 01/03/16 2346 01/04/16 0407 01/04/16 0801  GLUCAP 77 81 83 74 76 90   Imaging No results found.  STUDIES:  CT abdomen 9/29  Large supraumbilical ventral hernia containing a segment of the transverse colon and small bowel. 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 Post op course c/b resp failure, rapid afib, AKI, metabolic acidosis, pulm edema.    ASSESSMENT / PLAN:  PULMONARY A: Acute Hypoxemic resp failure 2/2 Unable to protect airway related to surgery, acute pulmonary edema/volume overload, possible COPD Possible Underlying COPD, heavy ex-smoker P:   Keep on PS, no extubation given mental status, D/C fentanyl.  Hold further diureses. Cont neb meds.   CARDIOVASCULAR A:  Hypertension New onset atrial fibrillation with RVR. Now in SR, sinus tachycardia Demand ischemia Pulmonary edema P:  Cardiology following > Cardizem was dc'd 2/2 hypotension related to sedation.  HR in 120-130ss now. Will defer to cards.  May use prn diltiazem for sinus tachycardia  RENAL A:   AKI on CKD , baseline creatinine 1.4 range Hyperkalemia-resolved Metabolic acidosis, improved P:   Hold ARB D/C lasix Replace mag BMET in AM Replace electrolytes as indicated  GASTROINTESTINAL A:   SBO , incarcerated hernia, postop repair P:   Start TF since doubt extubation today.  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  Pain P:   RASS goal: 0- -1 Propofol, intermittent Fent per PAD protocol. Will try precedex this am and see if she wakes up.   FAMILY  - Updates: No family bedside this AM  - Inter-disciplinary family meet or Palliative Care meeting due by:  10/7  The patient is critically ill with  multiple organ systems failure and requires high complexity decision making for assessment and support, frequent evaluation and titration of therapies, application of advanced monitoring technologies and extensive interpretation of multiple databases.   Critical Care Time devoted to patient care services described in this note is  35  Minutes. This time reflects time of care of this signee Dr Jennet Maduro. This critical care time does not reflect procedure time, or teaching time or supervisory time of PA/NP/Med student/Med Resident etc but could involve care discussion time.  Rush Farmer, M.D. [facility] Pulmonary/Critical Care Medicine. Pager: [phone]. After hours pager: [phone].  01/04/2016, 10:08 AM

## 2016-01-04 NOTE — Progress Notes (Signed)
Bedside sitter order was placed at 2045. Patient continues to pull at lines/tubes and attempting to get out of bed. She is still confused (only oriented to self) and agitated, but this is not a new onset. RN has allowed family to stay at bedside to maintain the pt's safety. Will continue to monitor closely.

## 2016-01-04 NOTE — Progress Notes (Signed)
Weaning parameters obtained NIF -60 cmh20 and VC 1.1 L/min.

## 2016-01-04 NOTE — Progress Notes (Signed)
Dr. Nelda Marseille in unit to check on patient.  Also made aware of finger tips on left hand.  Dr. Hulen Skains paged and made aware of finger tips as well.  Also let him know that we can dopple palmer and ulner pulses and can feel and dopple the radial pulse as well.

## 2016-01-04 NOTE — Progress Notes (Signed)
Patient Name: Stephanie Griffith Date of Encounter: 01/04/2016  Primary Cardiologist: [facility] Problem List     Principal Problem:   Incarcerated hernia Active Problems:   Essential hypertension, benign   Chronic kidney disease (CKD), stage III (moderate)   Abdominal pain   Hyperglycemia   Acute on chronic renal failure (HCC)   PAD (peripheral artery disease) (HCC)   Ventral hernia   Hyperkalemia   Acute hypoxemic respiratory failure (HCC)   Acute pulmonary edema (HCC)   Pressure injury of skin     Subjective   Sedate on vent. Family in room.   Inpatient Medications    Scheduled Meds: . chlorhexidine gluconate (MEDLINE KIT)  15 mL Mouth Rinse BID  . diltiazem  20 mg Intravenous Once  . famotidine (PEPCID) IV  20 mg Intravenous Q24H  . feeding supplement (VITAL HIGH PROTEIN)  1,000 mL Per Tube Q24H  . heparin subcutaneous  5,000 Units Subcutaneous Q8H  . [START ON 01/06/2016] Influenza vac split quadrivalent PF  0.5 mL Intramuscular Tomorrow-1000  . insulin aspart  0-15 Units Subcutaneous Q4H  . ipratropium  0.5 mg Nebulization Q6H  . magnesium sulfate 1 - 4 g bolus IVPB  2 g Intravenous Once  . mouth rinse  15 mL Mouth Rinse 10 times per day  . metoprolol  5 mg Intravenous Q6H  . piperacillin-tazobactam (ZOSYN)  IV  3.375 g Intravenous Q8H  . [START ON 01/06/2016] pneumococcal 23 valent vaccine  0.5 mL Intramuscular Tomorrow-1000  . sodium chloride flush  10-40 mL Intracatheter Q12H   Continuous Infusions: . dexmedetomidine Stopped (01/04/16 1000)  . diltiazem (CARDIZEM) infusion Stopped (01/01/16 1930)  . phenylephrine (NEO-SYNEPHRINE) Adult infusion Stopped (01/03/16 0945)  . propofol (DIPRIVAN) infusion Stopped (01/03/16 0830)   PRN Meds:.[DISCONTINUED] acetaminophen **OR** acetaminophen, fentaNYL (SUBLIMAZE) injection, hydrALAZINE, HYDROmorphone (DILAUDID) injection, labetalol, morphine injection, ondansetron **OR** ondansetron (ZOFRAN) IV, sodium  chloride flush   Vital Signs    Vitals:   01/04/16 0745 01/04/16 0800 01/04/16 0900 01/04/16 1000  BP:  (!) 111/93 114/61 (!) 142/75  Pulse:  70 71 81  Resp:  (!) 9 (!) 9 14  Temp: (!) 96.1 F (35.6 C)     TempSrc: Axillary     SpO2: 96% 100% 99% 99%  Weight:      Height:        Intake/Output Summary (Last 24 hours) at 01/04/16 1050 Last data filed at 01/04/16 1000  Gross per 24 hour  Intake           564.92 ml  Output             4005 ml  Net         -3440.08 ml   Filed Weights   01/02/16 0303 01/03/16 0419 01/04/16 0445  Weight: 227 lb 8.2 oz (103.2 kg) 233 lb 4 oz (105.8 kg) 225 lb 12 oz (102.4 kg)    Physical Exam    GEN: On vent.  HEENT: Grossly normal.  Neck: Supple, no JVD, carotid bruits, or masses. Cardiac: Tachy reg, no murmurs, rubs, or gallops. No clubbing, cyanosis, edema.  Radials/DP/PT 2+ and equal bilaterally.  Respiratory:  Respirations regular and unlabored, clear to auscultation bilaterally. GI: hypo BS. MS: no deformity or atrophy. Skin: warm and dry, no rash. Neuro:  Unable to assess strength, sedate. Psych: sedate.  Labs    CBC  Recent Labs  01/03/16 0903 01/04/16 0320  WBC 13.3* 16.0*  HGB 8.1* 8.5*  HCT 25.8* 27.4*  MCV 92.1 92.3  PLT 242 903   Basic Metabolic Panel  Recent Labs  01/03/16 0903 01/04/16 0320  NA 142 139  K 4.6 5.0  CL 113* 110  CO2 20* 17*  GLUCOSE 75 92  BUN 39* 41*  CREATININE 2.84* 2.65*  CALCIUM 7.7* 8.1*  MG 1.9 1.9  PHOS 5.4* 5.6*   Recent Labs  01/04/16 0320  TRIG 169*    Telemetry    Brief PAT/ PAF, now sinus rhythm, sinus tach - Personally Reviewed  ECG    AFIB RVR with TWI lateral precordial - Personally Reviewed  Radiology    Ct Head Wo Contrast  Result Date: 01/02/2016 CLINICAL DATA:  Altered mental status EXAM: CT HEAD WITHOUT CONTRAST TECHNIQUE: Contiguous axial images were obtained from the base of the skull through the vertex without intravenous contrast. COMPARISON:   None. FINDINGS: Brain: The ventricles are normal in size and configuration. There is mild invagination of CSF into the sella. There is no intracranial mass, hemorrhage, extra-axial fluid collection, or midline shift. 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 Elsewhere there is small vessel disease in the centra semiovale bilaterally. Small vessel disease is also noted in each thalamus. No acute appearing infarct is evident. Basal ganglia calcification is likely physiologic in this age group. Vascular: There is no hyperdense vessel evident. There is calcification in each carotid siphon region. Skull: The bony calvarium appears intact. Sinuses/Orbits: There is a small retention cyst in the anterior left maxillary antrum. There is mucosal thickening in several ethmoid air cells on the left, mild. Visualized paranasal sinuses elsewhere clear. Orbits appear symmetric bilaterally. Other: Mastoid air cells are clear. 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 evident. No intracranial mass, hemorrhage, or evidence of acute infarct. Mild paranasal sinus disease. Foci of arterial vascular calcification evident. Mild invagination of CSF into the sella is noted, a finding of questionable significance. Electronically Signed   By: Lowella Grip III M.D.   On: 01/02/2016 14:36   Dg Chest Port 1 View  Result Date: 01/03/2016 CLINICAL DATA:  Respiratory failure EXAM: PORTABLE CHEST 1 VIEW COMPARISON:  01/02/2016 FINDINGS: Right jugular central line, endotracheal tube and nasogastric catheter are again seen and stable. Cardiac shadow is mildly enlarged but stable. Small bilateral pleural effusions are again seen. Likely basilar atelectasis is present as well. The degree of vascular  congestion has improved slightly in the interval from the prior exam. No bony abnormality is noted. IMPRESSION: Bibasilar atelectatic changes and effusions. Electronically Signed   By: Inez Catalina M.D.   On: 01/03/2016 07:39     Cardiac Studies   2014 - ECHO normal EF, mildly dilated LA  Patient Profile     70 year old with PAF with RVR post op.  Assessment & Plan    Atrial fibrillation  - Dr. Percival Spanish consult note reviewed  - Brief PAT now, with majority of time now in NSR/ sinus tach  - Now off diltiazem IV (hypotension) - keep off for now.   - Will try to avoid amiodarone (COPD)  - EF on ECHO normal/ hyperdynamic.   - Will increase metoprolol from 3m to 165mIV Q6 to aid with hyperdynamic contraction and PAT especially during weaning period.   Anemia  - per primary team  AKI  - worsening renal  function  - per primary team.   - IVF off (up several liters)  Acute respiratory failure  - underlying COPD   - Reassuring EF  Signed, Candee Furbish, MD  01/04/2016, 10:50 AM

## 2016-01-04 NOTE — Progress Notes (Signed)
4 Days Post-Op  Subjective: Remains on vent, sedated  Objective: Vital signs in last 24 hours: Temp:  [96.1 F (35.6 C)-98.4 F (36.9 C)] 96.1 F (35.6 C) (10/04 0745) Pulse Rate:  [38-103] 38 (10/04 0705) Resp:  [18-25] 25 (10/04 0705) BP: (87-154)/(50-95) 145/76 (10/04 0705) SpO2:  [92 %-100 %] 96 % (10/04 0745) FiO2 (%):  [40 %] 40 % (10/04 0745) Weight:  [102.4 kg (225 lb 12 oz)] 102.4 kg (225 lb 12 oz) (10/04 0445) Last BM Date: 12/30/15  Intake/Output from previous day: 10/03 0701 - 10/04 0700 In: 701.4 [I.V.:508.9; NG/GT:30; IV Piggyback:162.5] Out: 3345 [Urine:3250; Emesis/NG output:95] Intake/Output this shift: Total I/O In: -  Out: 725 [Urine:725]  Exam: Abdomen soft, +BS, non distended, incision ok  Lab Results:   Recent Labs  01/03/16 0903 01/04/16 0320  WBC 13.3* 16.0*  HGB 8.1* 8.5*  HCT 25.8* 27.4*  PLT 242 271   BMET  Recent Labs  01/03/16 0903 01/04/16 0320  NA 142 139  K 4.6 5.0  CL 113* 110  CO2 20* 17*  GLUCOSE 75 92  BUN 39* 41*  CREATININE 2.84* 2.65*  CALCIUM 7.7* 8.1*   PT/INR No results for input(s): LABPROT, INR in the last 72 hours. ABG  Recent Labs  01/01/16 1419  PHART 7.322*  HCO3 16.6*    Studies/Results: Ct Head Wo Contrast  Result Date: 01/02/2016 CLINICAL DATA:  Altered mental status EXAM: CT HEAD WITHOUT CONTRAST TECHNIQUE: Contiguous axial images were obtained from the base of the skull through the vertex without intravenous contrast. COMPARISON:  None. FINDINGS: Brain: The ventricles are normal in size and configuration. There is mild invagination of CSF into the sella. There is no intracranial mass, hemorrhage, extra-axial fluid collection, or midline shift. 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 Elsewhere there is small vessel disease in the centra semiovale  bilaterally. Small vessel disease is also noted in each thalamus. No acute appearing infarct is evident. Basal ganglia calcification is likely physiologic in this age group. Vascular: There is no hyperdense vessel evident. There is calcification in each carotid siphon region. Skull: The bony calvarium appears intact. Sinuses/Orbits: There is a small retention cyst in the anterior left maxillary antrum. There is mucosal thickening in several ethmoid air cells on the left, mild. Visualized paranasal sinuses elsewhere clear. Orbits appear symmetric bilaterally. Other: Mastoid air cells are clear. 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 evident. No intracranial mass, hemorrhage, or evidence of acute infarct. Mild paranasal sinus disease. Foci of arterial vascular calcification evident. Mild invagination of CSF into the sella is noted, a finding of questionable significance. Electronically Signed   By: Lowella Grip III M.D.   On: 01/02/2016 14:36   Dg Chest Port 1 View  Result Date: 01/03/2016 CLINICAL DATA:  Respiratory failure EXAM: PORTABLE CHEST 1 VIEW COMPARISON:  01/02/2016 FINDINGS: Right jugular central line, endotracheal tube and nasogastric catheter are again seen and stable. Cardiac shadow is mildly enlarged but stable. Small bilateral pleural effusions are again seen. Likely basilar atelectasis is present as well. The degree of vascular congestion has improved slightly in the interval from the prior exam. No bony abnormality is noted. IMPRESSION: Bibasilar atelectatic changes and effusions. Electronically Signed   By: Inez Catalina M.D.   On: 01/03/2016 07:39    Anti-infectives:  Anti-infectives    Start     Dose/Rate Route Frequency Ordered Stop   01/01/16 0800  piperacillin-tazobactam (ZOSYN) IVPB 3.375 g     3.375 g 12.5 mL/hr over 240 Minutes Intravenous Every 8 hours 01/01/16 0746         Assessment/Plan: s/p Procedure(s): REPAIR INCARCERATED VENTRAL HERNIA, POSSIBLE BOWEL RESECTION (N/A) Partial OMENTECTOMY  Ok to start tube feeds.  Will use NG for now in case she does not tolerate this. If WBC continues to increase, may need a CT scan  LOS: 5 days    Ford Peddie A 01/04/2016

## 2016-01-05 ENCOUNTER — Inpatient Hospital Stay (HOSPITAL_COMMUNITY): Payer: Medicare Other

## 2016-01-05 ENCOUNTER — Encounter (HOSPITAL_COMMUNITY): Payer: Self-pay

## 2016-01-05 DIAGNOSIS — R739 Hyperglycemia, unspecified: Secondary | ICD-10-CM

## 2016-01-05 DIAGNOSIS — M79642 Pain in left hand: Secondary | ICD-10-CM

## 2016-01-05 DIAGNOSIS — N189 Chronic kidney disease, unspecified: Secondary | ICD-10-CM

## 2016-01-05 LAB — MAGNESIUM: Magnesium: 2.4 mg/dL (ref 1.7–2.4)

## 2016-01-05 LAB — GLUCOSE, CAPILLARY
Glucose-Capillary: 108 mg/dL — ABNORMAL HIGH (ref 65–99)
Glucose-Capillary: 104 mg/dL — ABNORMAL HIGH (ref 65–99)
Glucose-Capillary: 104 mg/dL — ABNORMAL HIGH (ref 65–99)
Glucose-Capillary: 84 mg/dL (ref 65–99)
Glucose-Capillary: 83 mg/dL (ref 65–99)
Glucose-Capillary: 81 mg/dL (ref 65–99)

## 2016-01-05 LAB — BLOOD GAS, ARTERIAL
Acid-base deficit: 5.9 mmol/L — ABNORMAL HIGH (ref 0.0–2.0)
Bicarbonate: 18.4 mmol/L — ABNORMAL LOW (ref 20.0–28.0)
Drawn by: 437071
FIO2: 36
O2 Content: 4 L/min
O2 Saturation: 97.1 %
Patient temperature: 98.6
pCO2 arterial: 32.5 mmHg (ref 32.0–48.0)
pH, Arterial: 7.372 (ref 7.350–7.450)
pO2, Arterial: 96.7 mmHg (ref 83.0–108.0)

## 2016-01-05 LAB — CBC
HCT: 26.5 % — ABNORMAL LOW (ref 36.0–46.0)
Hemoglobin: 8.3 g/dL — ABNORMAL LOW (ref 12.0–15.0)
MCH: 29 pg (ref 26.0–34.0)
MCHC: 31.3 g/dL (ref 30.0–36.0)
MCV: 92.7 fL (ref 78.0–100.0)
Platelets: 321 10*3/uL (ref 150–400)
RBC: 2.86 MIL/uL — ABNORMAL LOW (ref 3.87–5.11)
RDW: 13.6 % (ref 11.5–15.5)
WBC: 16.8 10*3/uL — ABNORMAL HIGH (ref 4.0–10.5)

## 2016-01-05 LAB — BASIC METABOLIC PANEL
Anion gap: 15 (ref 5–15)
BUN: 43 mg/dL — ABNORMAL HIGH (ref 6–20)
CO2: 20 mmol/L — ABNORMAL LOW (ref 22–32)
Calcium: 8 mg/dL — ABNORMAL LOW (ref 8.9–10.3)
Chloride: 106 mmol/L (ref 101–111)
Creatinine, Ser: 2.34 mg/dL — ABNORMAL HIGH (ref 0.44–1.00)
GFR calc Af Amer: 23 mL/min — ABNORMAL LOW (ref >60)
GFR calc non Af Amer: 20 mL/min — ABNORMAL LOW (ref >60)
Glucose, Bld: 111 mg/dL — ABNORMAL HIGH (ref 65–99)
Potassium: 4.5 mmol/L (ref 3.5–5.1)
Sodium: 141 mmol/L (ref 135–145)

## 2016-01-05 LAB — HEPARIN LEVEL (UNFRACTIONATED)
Heparin Unfractionated: 0.8 IU/mL — ABNORMAL HIGH (ref 0.30–0.70)
Heparin Unfractionated: 0.67 IU/mL (ref 0.30–0.70)

## 2016-01-05 LAB — PHOSPHORUS: Phosphorus: 3.8 mg/dL (ref 2.5–4.6)

## 2016-01-05 IMAGING — CR DG CHEST 1V PORT
1 series · 1 of 1 positions shown · non-contrast
Comparison: Portable chest x-ray [DATE]

CLINICAL DATA: Respiratory failure, interval extubation since the
earlier study.

EXAM:
PORTABLE CHEST 1 VIEW

[AP]
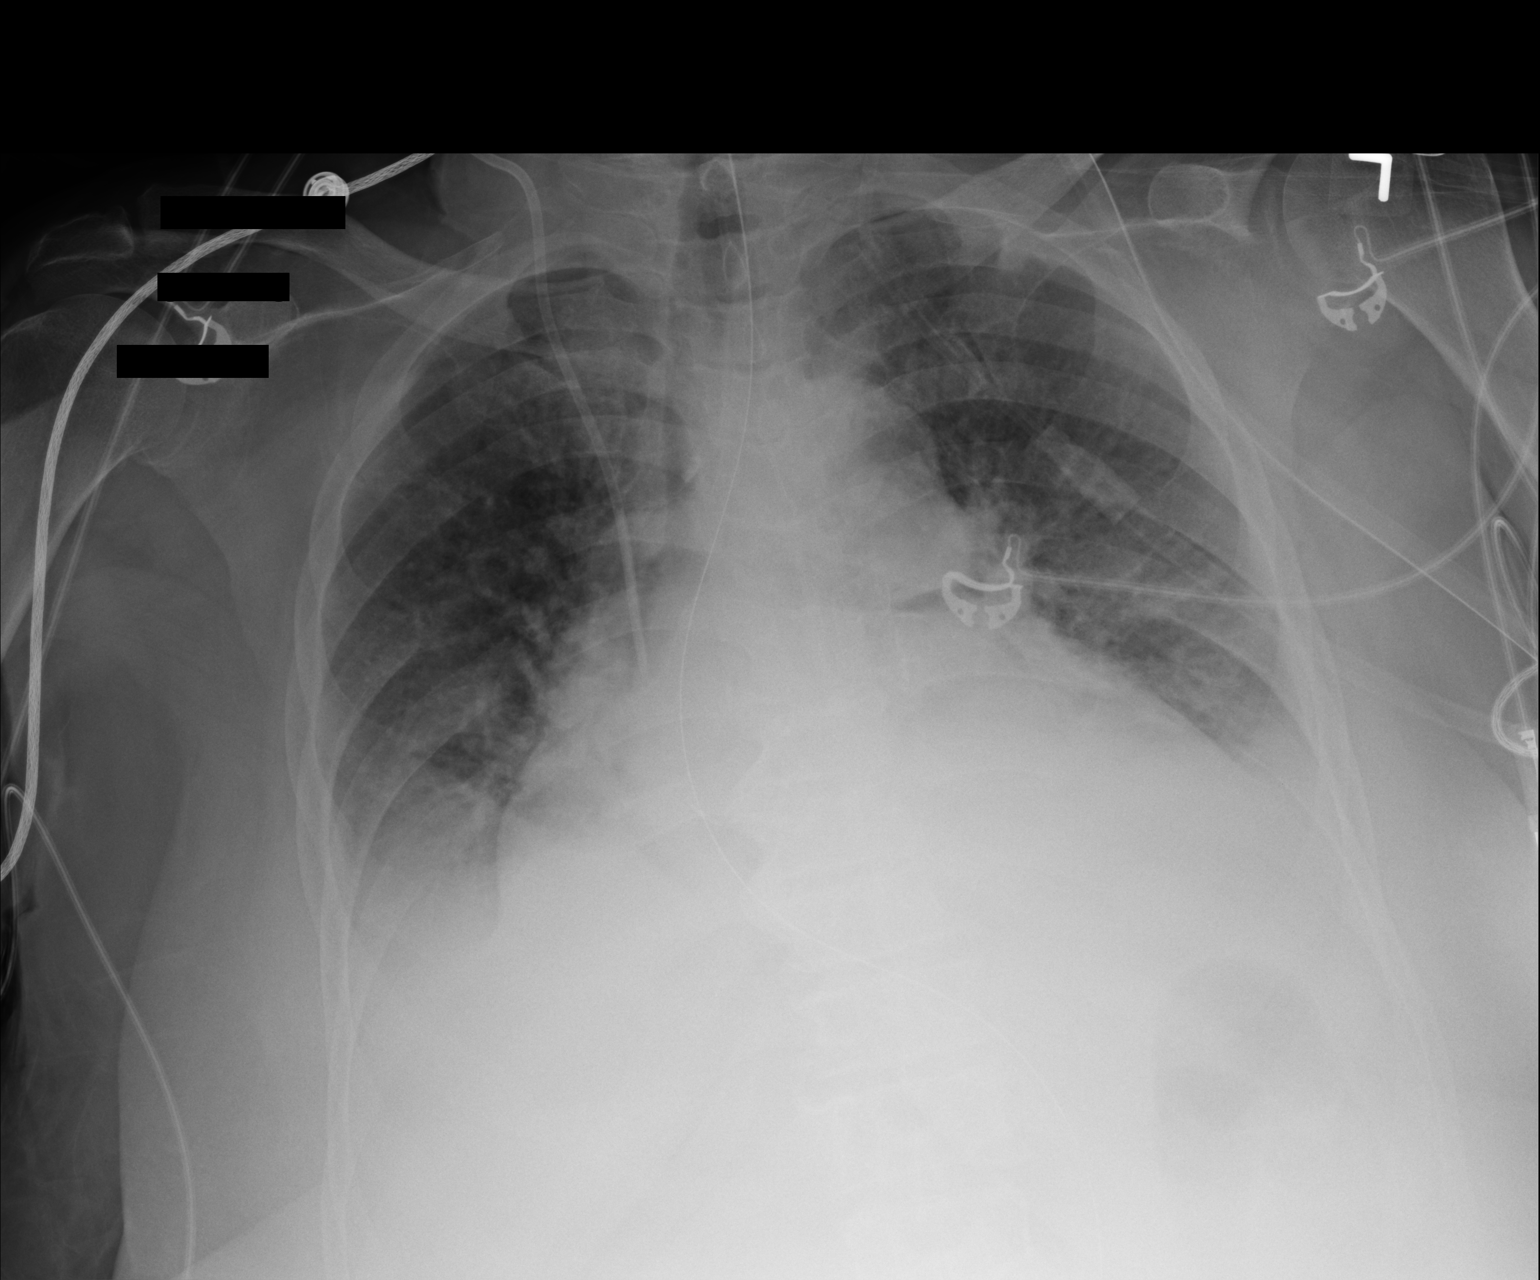

[1 of 1 positions shown; findings below may reference images not displayed]

FINDINGS: The lungs remain mildly hypoinflated. Bibasilar atelectasis or
pneumonia with small bilateral pleural effusions remain. The cardiac
silhouette remains enlarged. The central pulmonary vascularity is
mildly engorged. The interstitial markings are coarse. The
esophagogastric tube tip projects below the inferior margin of the
image. The right internal jugular venous catheter tip projects over
the junction of the middle and distal thirds of the SVC.
IMPRESSION: CHF with mild pulmonary vascular congestion and interstitial edema.
Interval extubation with persistent atelectasis or pneumonia with
small bilateral pleural effusions. There has not been significant
interval change since the pre extubation study [DATE].

## 2016-01-05 MED ORDER — DEXTROSE 50 % IV SOLN
1.0000 | Freq: Once | INTRAVENOUS | Status: AC
  Administered 2016-01-05: 50 mL via INTRAVENOUS

## 2016-01-05 MED ORDER — DEXTROSE 50 % IV SOLN
INTRAVENOUS | Status: AC
Start: 2016-01-05 — End: 2016-01-05
  Filled 2016-01-05: qty 50

## 2016-01-05 MED ORDER — ORAL CARE MOUTH RINSE
15.0000 mL | Freq: Four times a day (QID) | OROMUCOSAL | Status: DC
  Administered 2016-01-06 – 2016-01-10 (×14): 15 mL via OROMUCOSAL

## 2016-01-05 NOTE — Progress Notes (Signed)
  Progress Note    01/05/2016 3:04 PM 5 Days Post-Op  Subjective:  No left hand pain at this time  Vitals:   01/05/16 1300 01/05/16 1400  BP: (!) 151/68 (!) 148/73  Pulse: 80 82  Resp: 10 11  Temp:      Physical Exam: Left arm multiphasic signal at ulnar artery Distal radial at hand with signal, no radial signal at location of previous arterial line Stable dusky appearance of left distal fingers  CBC    Component Value Date/Time   WBC 16.8 (H) 01/05/2016 0429   RBC 2.86 (L) 01/05/2016 0429   HGB 8.3 (L) 01/05/2016 0429   HCT 26.5 (L) 01/05/2016 0429   PLT 321 01/05/2016 0429   MCV 92.7 01/05/2016 0429   MCH 29.0 01/05/2016 0429   MCHC 31.3 01/05/2016 0429   RDW 13.6 01/05/2016 0429   LYMPHSABS 1.0 12/29/2015 2154   MONOABS 0.5 12/29/2015 2154   EOSABS 0.0 12/29/2015 2154   BASOSABS 0.0 12/29/2015 2154    BMET    Component Value Date/Time   NA 141 01/05/2016 0429   K 4.5 01/05/2016 0429   CL 106 01/05/2016 0429   CO2 20 (L) 01/05/2016 0429   GLUCOSE 111 (H) 01/05/2016 0429   BUN 43 (H) 01/05/2016 0429   CREATININE 2.34 (H) 01/05/2016 0429   CALCIUM 8.0 (L) 01/05/2016 0429   GFRNONAA 20 (L) 01/05/2016 0429   GFRAA 23 (L) 01/05/2016 0429    INR    Component Value Date/Time   INR 1.06 12/30/2015 1340     Intake/Output Summary (Last 24 hours) at 01/05/16 1504 Last data filed at 01/05/16 1400  Gross per 24 hour  Intake           729.03 ml  Output             1000 ml  Net          -270.97 ml     Assessment:  70 y.o. female is s/p ventral hernia repair for incarcerated bowel now with ischemic changes to distal left fingers following arterial line removal, unknown time period.  Plan: Given that she is asymptomatic, will hold on intervention for now. Would benefit from arch angiogram with lue runoff and possible intervention but Cr currently remains elevated. Will plan angiogram prior to d/c if no symptoms develop.   Yu Peggs C. Donzetta Matters, MD [facility] Office: [phone] Pager: [phone]  01/05/2016 3:04 PM

## 2016-01-05 NOTE — Progress Notes (Addendum)
Patient Name: Stephanie Griffith Date of Encounter: 01/05/2016  Primary Cardiologist: [facility] Problem List     Principal Problem:   Incarcerated hernia Active Problems:   Essential hypertension, benign   Chronic kidney disease (CKD), stage III (moderate)   Abdominal pain   Hyperglycemia   Acute on chronic renal failure (HCC)   PAD (peripheral artery disease) (HCC)   Ventral hernia   Hyperkalemia   Acute hypoxemic respiratory failure (HCC)   Acute pulmonary edema (HCC)   Pressure injury of skin     Subjective   Awake, extubated. Mildly confused.   Inpatient Medications    Scheduled Meds: . chlorhexidine gluconate (MEDLINE KIT)  15 mL Mouth Rinse BID  . diltiazem  20 mg Intravenous Once  . famotidine (PEPCID) IV  20 mg Intravenous Q24H  . feeding supplement (VITAL HIGH PROTEIN)  1,000 mL Per Tube Q24H  . [START ON 01/06/2016] Influenza vac split quadrivalent PF  0.5 mL Intramuscular Tomorrow-1000  . insulin aspart  0-15 Units Subcutaneous Q4H  . ipratropium  0.5 mg Nebulization Q6H  . mouth rinse  15 mL Mouth Rinse QID  . metoprolol  10 mg Intravenous Q6H  . piperacillin-tazobactam (ZOSYN)  IV  3.375 g Intravenous Q8H  . [START ON 01/06/2016] pneumococcal 23 valent vaccine  0.5 mL Intramuscular Tomorrow-1000  . sodium chloride flush  10-40 mL Intracatheter Q12H   Continuous Infusions: . diltiazem (CARDIZEM) infusion Stopped (01/01/16 1930)  . heparin 1,050 Units/hr (01/05/16 0946)  . phenylephrine (NEO-SYNEPHRINE) Adult infusion Stopped (01/03/16 0945)   PRN Meds:.[DISCONTINUED] acetaminophen **OR** acetaminophen, fentaNYL (SUBLIMAZE) injection, hydrALAZINE, HYDROmorphone (DILAUDID) injection, labetalol, morphine injection, ondansetron **OR** ondansetron (ZOFRAN) IV, sodium chloride flush   Vital Signs    Vitals:   01/05/16 0735 01/05/16 0800 01/05/16 0843 01/05/16 0900  BP:  (!) 140/129  129/64  Pulse:  98  84  Resp:  16  14  Temp: 97.8 F (36.6 C)       TempSrc: Oral     SpO2:  100% 99% 99%  Weight:      Height:        Intake/Output Summary (Last 24 hours) at 01/05/16 1040 Last data filed at 01/05/16 1000  Gross per 24 hour  Intake              695 ml  Output             1055 ml  Net             -360 ml   Filed Weights   01/03/16 0419 01/04/16 0445 01/05/16 0500  Weight: 233 lb 4 oz (105.8 kg) 225 lb 12 oz (102.4 kg) 230 lb 6.1 oz (104.5 kg)    Physical Exam    GEN: Awake HEENT: Grossly normal.  Neck: Supple, no JVD, carotid bruits, or masses. Cardiac: RRR, no murmurs, rubs, or gallops.  Respiratory:  Respirations regular and unlabored, clear to auscultation bilaterally. GI: hypo BS. MS: no deformity or atrophy. Skin:, left hand finger with cyanotic changes. Neuro:  Unable to assess strength, sedate. Psych: mildly confused.  Labs    CBC  Recent Labs  01/04/16 0320 01/05/16 0429  WBC 16.0* 16.8*  HGB 8.5* 8.3*  HCT 27.4* 26.5*  MCV 92.3 92.7  PLT 271 937   Basic Metabolic Panel  Recent Labs  01/04/16 0320 01/05/16 0429  NA 139 141  K 5.0 4.5  CL 110 106  CO2 17* 20*  GLUCOSE 92 111*  BUN  41* 43*  CREATININE 2.65* 2.34*  CALCIUM 8.1* 8.0*  MG 1.9 2.4  PHOS 5.6* 3.8    Recent Labs  01/04/16 0320  TRIG 169*    Telemetry    Pac's, now sinus rhythm - Personally Reviewed  ECG    AFIB RVR with TWI lateral precordial - Personally Reviewed  Radiology    Dg Chest Port 1 View  Result Date: 01/05/2016 CLINICAL DATA:  Respiratory failure, interval extubation since the earlier study. EXAM: PORTABLE CHEST 1 VIEW COMPARISON:  Portable chest x-ray of January 03, 2016 FINDINGS: The lungs remain mildly hypoinflated. Bibasilar atelectasis or pneumonia with small bilateral pleural effusions remain. The cardiac silhouette remains enlarged. The central pulmonary vascularity is mildly engorged. The interstitial markings are coarse. The esophagogastric tube tip projects below the inferior margin of the image.  The right internal jugular venous catheter tip projects over the junction of the middle and distal thirds of the SVC. IMPRESSION: CHF with mild pulmonary vascular congestion and interstitial edema. Interval extubation with persistent atelectasis or pneumonia with small bilateral pleural effusions. There has not been significant interval change since the pre extubation study of 03 January 2016. Electronically Signed   By: David  Martinique M.D.   On: 01/05/2016 07:51     Cardiac Studies   2014 - ECHO normal EF, mildly dilated LA  Patient Profile     70 year old with PAF with RVR post op, PAT.  Assessment & Plan    Atrial fibrillation  -Now NSR, PAC's  - tolerating increased metop 10 IV Q6. Change to PO when able.   - EF on ECHO normal/ hyperdynamic.    Acute respiratory failure  - underlying COPD   - Reassuring EF  - Successful extubation  Cyanosis left finger tips  - heparin, art u/s per CCM  - radial art occl. Vascular surg on board.    Signed, Candee Furbish, MD  01/05/2016, 10:40 AM

## 2016-01-05 NOTE — Progress Notes (Signed)
5 Days Post-Op  Subjective: Tolerating extubation Denies abdominal pain Just pulled NG out  Objective: Vital signs in last 24 hours: Temp:  [97.8 F (36.6 C)-98.7 F (37.1 C)] 97.8 F (36.6 C) (10/05 0735) Pulse Rate:  [70-116] 85 (10/05 0700) Resp:  [9-28] 10 (10/05 0700) BP: (114-160)/(61-143) 122/62 (10/05 0700) SpO2:  [95 %-100 %] 99 % (10/05 0843) FiO2 (%):  [40 %] 40 % (10/04 1315) Weight:  [104.5 kg (230 lb 6.1 oz)] 104.5 kg (230 lb 6.1 oz) (10/05 0500) Last BM Date: 12/30/15  Intake/Output from previous day: 10/04 0701 - 10/05 0700 In: 681.3 [I.V.:291.3; NG/GT:210; IV Piggyback:50] Out: 1771 [Urine:1685; Emesis/NG output:20] Intake/Output this shift: No intake/output data recorded.  Exam: Abdomen soft, non tender  Lab Results:   Recent Labs  01/04/16 0320 01/05/16 0429  WBC 16.0* 16.8*  HGB 8.5* 8.3*  HCT 27.4* 26.5*  PLT 271 321   BMET  Recent Labs  01/04/16 0320 01/05/16 0429  NA 139 141  K 5.0 4.5  CL 110 106  CO2 17* 20*  GLUCOSE 92 111*  BUN 41* 43*  CREATININE 2.65* 2.34*  CALCIUM 8.1* 8.0*   PT/INR No results for input(s): LABPROT, INR in the last 72 hours. ABG  Recent Labs  01/05/16 0327  PHART 7.372  HCO3 18.4*    Studies/Results: Dg Chest Port 1 View  Result Date: 01/05/2016 CLINICAL DATA:  Respiratory failure, interval extubation since the earlier study. EXAM: PORTABLE CHEST 1 VIEW COMPARISON:  Portable chest x-ray of January 03, 2016 FINDINGS: The lungs remain mildly hypoinflated. Bibasilar atelectasis or pneumonia with small bilateral pleural effusions remain. The cardiac silhouette remains enlarged. The central pulmonary vascularity is mildly engorged. The interstitial markings are coarse. The esophagogastric tube tip projects below the inferior margin of the image. The right internal jugular venous catheter tip projects over the junction of the middle and distal thirds of the SVC. IMPRESSION: CHF with mild pulmonary vascular  congestion and interstitial edema. Interval extubation with persistent atelectasis or pneumonia with small bilateral pleural effusions. There has not been significant interval change since the pre extubation study of 03 January 2016. Electronically Signed   By: David  Martinique M.D.   On: 01/05/2016 07:51    Anti-infectives: Anti-infectives    Start     Dose/Rate Route Frequency Ordered Stop   01/01/16 0800  piperacillin-tazobactam (ZOSYN) IVPB 3.375 g     3.375 g 12.5 mL/hr over 240 Minutes Intravenous Every 8 hours 01/01/16 0746        Assessment/Plan: s/p Procedure(s): REPAIR INCARCERATED VENTRAL HERNIA, POSSIBLE BOWEL RESECTION (N/Griffith) Partial OMENTECTOMY  Will allow clear liquids Hold on cortract/feeding tube Out of bed/PT  LOS: 6 days    Stephanie Griffith 01/05/2016

## 2016-01-05 NOTE — Progress Notes (Signed)
Dr. Stevenson Clinch with CCM notified about pt's CBG's in the 37's and tube feeds being turned off at midnight. Orders received to give an amp of D50 and continue to monitor. Will implement and continue to monitor closely.

## 2016-01-05 NOTE — Progress Notes (Signed)
[place] Progress Note Patient Name: Stephanie Griffith DOB: 09-30-1945 MRN: [id number]   Date of Service  01/05/2016  HPI/Events of Note  Gluc = 70, NPO for procedure in the AM  eICU Interventions  1amp D50, continue to monitor.      Intervention Category Major Interventions: Other:  Jomarion Mish 01/05/2016, 12:09 AM

## 2016-01-05 NOTE — Progress Notes (Addendum)
*  PRELIMINARY RESULTS* Vascular Ultrasound Left Upper Extremity Arterial Duplex has been completed.   There is no flow by color or pulsed wave Doppler in the distal left radial artery, suggestive of possible occlusion. Allen test of the left palmar arch exhibited no signal change with radial compression, and signal obliteration with ulnar compression.  Preliminary results discussed with Altha Harm, RN, and Dr. Donzetta Matters via [place] nurse.  01/05/2016 10:45 AM Maudry Mayhew, BS, RVT, RDCS, RDMS

## 2016-01-05 NOTE — Progress Notes (Addendum)
PULMONARY / CRITICAL CARE MEDICINE   Name: Stephanie Griffith MRN: [id number] DOB: [birth year]    ADMISSION DATE:  12/30/2015 CONSULTATION DATE:  01/05/2016  REFERRING MD:  Grandville Silos, MD  CHIEF COMPLAINT:  Postoperative vent management  HISTORY OF PRESENT ILLNESS:   70 year old ex-smoker with long-standing ventral hernia admitted with nausea and vomiting and abdominal pain. She  underwent repair of incarcerated ventral hernia that was causing small bowel obstruction. Preop showed atrial fibrillation requiring Cardizem drip and was hypertensive postoperatively, left on the ventilator, hence we're consulted. Other complicating features were AK I with hyperkalemia-for which she was treated with Kayexalate calcium and D50/insulin and hyperglycemia. She has no prior diagnosis of .  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  Left hand with ecchymosis at the finger tips.  VITAL SIGNS: BP 122/62   Pulse 85   Temp 97.8 F (36.6 C) (Oral)   Resp 10   Ht 5\' 8"  (1.727 m)   Wt 104.5 kg (230 lb 6.1 oz)   SpO2 100%   BMI 35.03 kg/m   HEMODYNAMICS:    VENTILATOR SETTINGS: Vent Mode: PSV;CPAP FiO2 (%):  [40 %] 40 % PEEP:  [5 cmH20] 5 cmH20 Pressure Support:  [5 cmH20] 5 cmH20  INTAKE / OUTPUT: I/O last 3 completed shifts: In: 1036.3 [I.V.:546.3; Other:130; NG/GT:210; IV Piggyback:150] Out: 3080 [Urine:2985; Emesis/NG output:95]  PHYSICAL EXAMINATION: Gen. Elderly, obese, confused. ENT - [place]/AT, PERRL, EOM-I and MMM Neck: Supple, -JVD Lungs: Bibasilar crackles but otherwise clear Cardiovascular: Nl S1/S2.  No murmurs or gallops, Gr 1 edema Abdomen: Midline incision with bandage ,soft and non-tender, no hepatosplenomegaly, BS decreased Musculoskeletal: Cyanosis  at left upper arm at the finger tips, otherwise normal. Neuro: CN grossly N. Moving all ext to command.   LABS:  BMET  Recent Labs Lab 01/03/16 0903 01/04/16 0320 01/05/16 0429  NA 142 139 141  K 4.6 5.0 4.5  CL 113* 110 106  CO2 20* 17* 20*  BUN 39* 41* 43*  CREATININE 2.84* 2.65* 2.34*  GLUCOSE 75 92 111*   Electrolytes  Recent Labs Lab 01/03/16 0903 01/04/16 0320 01/05/16 0429  CALCIUM 7.7* 8.1* 8.0*  MG 1.9 1.9 2.4  PHOS 5.4* 5.6* 3.8   CBC  Recent Labs Lab 01/03/16 0903 01/04/16 0320 01/05/16 0429  WBC 13.3* 16.0* 16.8*  HGB 8.1* 8.5* 8.3*  HCT 25.8* 27.4* 26.5*  PLT 242 271 321   Coag's  Recent Labs Lab 12/30/15 1340  INR 1.06   Sepsis Markers  Recent Labs Lab 12/30/15 0114 12/30/15 1340 01/01/16 0400  LATICACIDVEN 1.92* 2.5* 2.0*   ABG  Recent Labs Lab 01/01/16 0403 01/01/16 1419 01/05/16 0327  PHART 7.254* 7.322* 7.372  PCO2ART 31.0* 32.3 32.5  PO2ART 131.0* 105.0 96.7   Liver Enzymes  Recent Labs Lab 12/29/15 2154 12/31/15 0210  AST 23 24  ALT 15 13*  ALKPHOS 178* 132*  BILITOT 0.4 0.5  ALBUMIN 4.0 3.0*   Cardiac Enzymes  Recent Labs Lab 12/31/15 1650  TROPONINI 0.29*   Glucose  Recent Labs Lab 01/04/16 1158 01/04/16 1623 01/04/16 1949 01/04/16 2342 01/05/16 0347 01/05/16 0732  GLUCAP 91 82 73 73 108* 104*   Imaging Dg Chest Port 1 View  Result Date: 01/05/2016 CLINICAL DATA:  Respiratory failure, interval extubation  since the earlier study. EXAM: PORTABLE CHEST 1 VIEW COMPARISON:  Portable chest x-ray of January 03, 2016 FINDINGS: The lungs remain mildly hypoinflated. Bibasilar atelectasis or pneumonia with small bilateral pleural effusions remain. The cardiac silhouette remains enlarged. The central pulmonary vascularity is mildly engorged. The interstitial markings are coarse. The esophagogastric tube tip projects below the inferior margin of the image. The right internal jugular venous catheter tip  projects over the junction of the middle and distal thirds of the SVC. IMPRESSION: CHF with mild pulmonary vascular congestion and interstitial edema. Interval extubation with persistent atelectasis or pneumonia with small bilateral pleural effusions. There has not been significant interval change since the pre extubation study of 03 January 2016. Electronically Signed   By: David  Martinique M.D.   On: 01/05/2016 07:51   STUDIES:  CT abdomen 9/29  Large supraumbilical ventral hernia containing a segment of the transverse colon and small bowel. 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 Post op course c/b resp failure, rapid afib, AKI, metabolic acidosis, pulm edema.   ASSESSMENT / PLAN:  PULMONARY A: Acute Hypoxemic resp failure 2/2 Unable to protect airway related to surgery, acute pulmonary edema/volume overload, possible COPD Possible Underlying COPD, heavy ex-smoker P:   Titrate O2 for sat of 88-92% IS per RT protocol. Mobilize as able. Hold further diureses - patient is auto-diuresing. Cont neb meds.   CARDIOVASCULAR A:  Hypertension New onset atrial fibrillation with RVR. Now in SR, sinus tachycardia Demand ischemia Pulmonary edema P:  Cardiology following > Cardizem was dc'd 2/2 hypotension related to sedation.  HR in 120-130ss now. Will defer to cards.  May use prn diltiazem for sinus tachycardia  RENAL A:   AKI on CKD , baseline creatinine 1.4 range Hyperkalemia-resolved Metabolic acidosis, improved P:   Hold ARB D/C lasix -  auto diuresing. Replace mag BMET in AM  GASTROINTESTINAL A:   SBO , incarcerated hernia, postop repair P:   TF when ok with CCS ?TPN, will defer to CCS  HEMATOLOGIC A:   Anemia , chronic disease + acute blood loss P:  Transfuse for Hb < 7  INFECTIOUS A:   SBO with fever and leukocytosis (improving). 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  Pain P:   RASS goal: 0- -1 D/C all sedation.  Cyanosis at left finger tips:  - Heparin  - U/S  FAMILY  - Updates: No family bedside this AM but were updated bedside at 10/4 during afternoon rounds.  - Inter-disciplinary family meet or Palliative Care meeting due by:  10/7  Discussed with bedside RN and PCCM-NP.  PCCM will sign off, please call back if needed.  Rush Farmer, M.D. [facility]. Pager: [phone]. After hours pager: [phone].  01/05/2016, 8:21 AM

## 2016-01-05 NOTE — Progress Notes (Signed)
Lab called about pt's heparin level in the morning. She stated that someone will collect it.

## 2016-01-05 NOTE — Progress Notes (Signed)
Ideal for heparin Indication: ischemic left upper extremity  No Known Allergies  Patient Measurements: Height: 5\' 8"  (172.7 cm) Weight: 230 lb 6.1 oz (104.5 kg) IBW/kg (Calculated) : 63.9 Heparin Dosing Weight: 86kg  Vital Signs: Temp: 97.8 F (36.6 C) (10/05 0735) Temp Source: Oral (10/05 0735) BP: 129/64 (10/05 0900) Pulse Rate: 84 (10/05 0900)  Labs:  Recent Labs  01/03/16 0903 01/04/16 0320 01/05/16 0429 01/05/16 0804  HGB 8.1* 8.5* 8.3*  --   HCT 25.8* 27.4* 26.5*  --   PLT 242 271 321  --   HEPARINUNFRC  --   --   --  0.80*  CREATININE 2.84* 2.65* 2.34*  --     Estimated Creatinine Clearance: 28.3 mL/min (by C-G formula based on SCr of 2.34 mg/dL (H)).  Assessment: 35 YOF admitted for hernia repair, now with ischemia of left distal fingertips.  Heparin level = 0.8 CBC stable  Goal of Therapy:  Heparin level 0.3-0.7 units/ml Monitor platelets by anticoagulation protocol: Yes   Plan:  Heparin to 1050 units / hr Heparin level at 1800 pm  Thank you Anette Guarneri, PharmD [phone] -01/05/2016 9:45 AM

## 2016-01-05 NOTE — Progress Notes (Signed)
01/05/16  Pharmacy- heparin 2039   Heparin level 0.67 on 1050 units/hr   A/P:  70yo female with ischemic L-upper extremity.  Heparin was adjusted this AM for elevated heparin level, now within goal but at upper limit of range.  Per d/w RN, no bleeding and no problems with the IV.   1-  Decrease heparin to 1000 units/hr 2-  F/U heparin level in AM   Gracy Bruins, PharmD [facility]

## 2016-01-05 NOTE — Progress Notes (Signed)
Lab called again because pt's heparin level needs to be drawn. She stated that someone will come to draw it shortly.

## 2016-01-06 LAB — BASIC METABOLIC PANEL
Anion gap: 14 (ref 5–15)
BUN: 34 mg/dL — ABNORMAL HIGH (ref 6–20)
CO2: 20 mmol/L — ABNORMAL LOW (ref 22–32)
Calcium: 8.6 mg/dL — ABNORMAL LOW (ref 8.9–10.3)
Chloride: 110 mmol/L (ref 101–111)
Creatinine, Ser: 2.12 mg/dL — ABNORMAL HIGH (ref 0.44–1.00)
GFR calc Af Amer: 26 mL/min — ABNORMAL LOW (ref >60)
GFR calc non Af Amer: 22 mL/min — ABNORMAL LOW (ref >60)
Glucose, Bld: 83 mg/dL (ref 65–99)
Potassium: 4.7 mmol/L (ref 3.5–5.1)
Sodium: 144 mmol/L (ref 135–145)

## 2016-01-06 LAB — GLUCOSE, CAPILLARY
Glucose-Capillary: 102 mg/dL — ABNORMAL HIGH (ref 65–99)
Glucose-Capillary: 114 mg/dL — ABNORMAL HIGH (ref 65–99)
Glucose-Capillary: 120 mg/dL — ABNORMAL HIGH (ref 65–99)
Glucose-Capillary: 136 mg/dL — ABNORMAL HIGH (ref 65–99)
Glucose-Capillary: 150 mg/dL — ABNORMAL HIGH (ref 65–99)
Glucose-Capillary: 69 mg/dL (ref 65–99)
Glucose-Capillary: 95 mg/dL (ref 65–99)

## 2016-01-06 LAB — HEPARIN LEVEL (UNFRACTIONATED): Heparin Unfractionated: 0.51 IU/mL (ref 0.30–0.70)

## 2016-01-06 LAB — MAGNESIUM: Magnesium: 2.2 mg/dL (ref 1.7–2.4)

## 2016-01-06 LAB — CBC
HCT: 27.5 % — ABNORMAL LOW (ref 36.0–46.0)
Hemoglobin: 8.3 g/dL — ABNORMAL LOW (ref 12.0–15.0)
MCH: 28.3 pg (ref 26.0–34.0)
MCHC: 30.2 g/dL (ref 30.0–36.0)
MCV: 93.9 fL (ref 78.0–100.0)
Platelets: 363 10*3/uL (ref 150–400)
RBC: 2.93 MIL/uL — ABNORMAL LOW (ref 3.87–5.11)
RDW: 14 % (ref 11.5–15.5)
WBC: 14.6 10*3/uL — ABNORMAL HIGH (ref 4.0–10.5)

## 2016-01-06 LAB — PHOSPHORUS: Phosphorus: 3.4 mg/dL (ref 2.5–4.6)

## 2016-01-06 MED ORDER — DEXTROSE 50 % IV SOLN
1.0000 | Freq: Once | INTRAVENOUS | Status: AC
  Administered 2016-01-06: 50 mL via INTRAVENOUS
  Filled 2016-01-06: qty 50

## 2016-01-06 NOTE — Progress Notes (Signed)
PULMONARY / CRITICAL CARE MEDICINE   Name: Stephanie Griffith MRN: [id number] DOB: 1945-05-16    ADMISSION DATE:  12/30/2015 CONSULTATION DATE:  01/06/2016  REFERRING MD:  Grandville Silos, MD  CHIEF COMPLAINT:  Postoperative vent management  HISTORY OF PRESENT ILLNESS:   70 year old ex-smoker with long-standing ventral hernia admitted with nausea and vomiting and abdominal pain. She  underwent repair of incarcerated ventral hernia that was causing small bowel obstruction. Preop showed atrial fibrillation requiring Cardizem drip and was hypertensive postoperatively, left on the ventilator, hence we're consulted. Other complicating features were AK I with hyperkalemia-for which she was treated with Kayexalate calcium and D50/insulin and hyperglycemia. She has no prior diagnosis of .  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 and is wheelchair bound but independent with transferring and ADLs She has long-standing hypertension and COPD with baseline creatinine of 1.4 and peripheral arterial disease    SUBJECTIVE:  No events overnight, less confused this AM  VITAL SIGNS: BP (!) 161/87 (BP Location: Right Arm)   Pulse 87   Temp 98 F (36.7 C) (Oral)   Resp 11   Ht 5\' 8"  (1.727 m)   Wt 104.4 kg (230 lb 2.6 oz)   SpO2 96%   BMI 35.00 kg/m   HEMODYNAMICS:    VENTILATOR SETTINGS:    INTAKE / OUTPUT: I/O last 3 completed shifts: In: 1062.4 [I.V.:512.4; Other:360; NG/GT:190] Out: 1925 [Urine:1905; Emesis/NG output:20]  PHYSICAL EXAMINATION: Gen. Elderly, obese, confused. ENT - Parrott/AT, PERRL, EOM-I and MMM. Neck: Supple, -JVD. Lungs: Bibasilar crackles but otherwise clear. Cardiovascular: Nl S1/S2.  No murmurs or gallops, Gr 1 edema. Abdomen: Midline incision with bandage ,soft and non-tender, no hepatosplenomegaly, BS decreased. Musculoskeletal: Cyanosis at left upper arm at the finger tips, otherwise normal. Neuro: CN grossly N. Moving all ext to command.   LABS:  BMET  Recent Labs Lab  01/04/16 0320 01/05/16 0429 01/06/16 0247  NA 139 141 144  K 5.0 4.5 4.7  CL 110 106 110  CO2 17* 20* 20*  BUN 41* 43* 34*  CREATININE 2.65* 2.34* 2.12*  GLUCOSE 92 111* 83   Electrolytes  Recent Labs Lab 01/04/16 0320 01/05/16 0429 01/06/16 0247  CALCIUM 8.1* 8.0* 8.6*  MG 1.9 2.4 2.2  PHOS 5.6* 3.8 3.4   CBC  Recent Labs Lab 01/04/16 0320 01/05/16 0429 01/06/16 0247  WBC 16.0* 16.8* 14.6*  HGB 8.5* 8.3* 8.3*  HCT 27.4* 26.5* 27.5*  PLT 271 321 363   Coag's  Recent Labs Lab 12/30/15 1340  INR 1.06   Sepsis Markers  Recent Labs Lab 12/30/15 1340 01/01/16 0400  LATICACIDVEN 2.5* 2.0*   ABG  Recent Labs Lab 01/01/16 0403 01/01/16 1419 01/05/16 0327  PHART 7.254* 7.322* 7.372  PCO2ART 31.0* 32.3 32.5  PO2ART 131.0* 105.0 96.7   Liver Enzymes  Recent Labs Lab 12/31/15 0210  AST 24  ALT 13*  ALKPHOS 132*  BILITOT 0.5  ALBUMIN 3.0*   Cardiac Enzymes  Recent Labs Lab 12/31/15 1650  TROPONINI 0.29*   Glucose  Recent Labs Lab 01/05/16 1541 01/05/16 1953 01/05/16 2254 01/06/16 0401 01/06/16 0455 01/06/16 0859  GLUCAP 84 83 81 69 150* 95   Imaging No results found. STUDIES:  CT abdomen 9/29  Large supraumbilical ventral hernia containing a segment of the transverse colon and small bowel. 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 Post op course c/b resp failure, rapid afib, AKI, metabolic acidosis, pulm edema.   ASSESSMENT / PLAN:  PULMONARY A: Acute Hypoxemic resp failure 2/2  Unable to protect airway related to surgery, acute pulmonary edema/volume overload, possible COPD Possible Underlying COPD, heavy ex-smoker P:   Titrate O2 for sat of 88-92% IS per RT protocol. Mobilize as able. Hold further diureses - patient is auto-diuresing. Cont neb meds.   CARDIOVASCULAR A:  Hypertension New onset atrial fibrillation with RVR. Now in SR, sinus tachycardia Demand ischemia Pulmonary edema P:  Cardiology following > Cardizem was dc'd 2/2 hypotension related to sedation.  HR in 120-130ss now. Will defer to cards.  Dilt per cards  RENAL A:   AKI on CKD , baseline creatinine 1.4 range Hyperkalemia-resolved Metabolic acidosis, improved P:   Hold ARB D/C lasix - auto diuresing. Replace mag BMET in AM  GASTROINTESTINAL A:   SBO , incarcerated hernia, postop repair P:   Diet per surgery  HEMATOLOGIC A:   Anemia , chronic disease + acute blood loss P:  Transfuse for Hb < 7  INFECTIOUS A:   SBO with fever and leukocytosis (improving). 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  Pain P:   RASS goal: 0- -1 D/C all sedation.  Cyanosis at left finger tips:  - Heparin  - Per [facility]  FAMILY  - Updates: No family bedside 10/6.  - Inter-disciplinary family meet or Palliative Care meeting due by:  10/7  Transfer to SDU unit and back to [facility]  Discussed with bedside RN and PCCM-NP.  PCCM will sign off, please call back if needed.  Rush Farmer, M.D. [facility] Pulmonary/Critical Care Medicine. Pager: [phone]. After hours pager: [phone].  01/06/2016, 10:08 AM

## 2016-01-06 NOTE — Progress Notes (Signed)
6 Days Post-Op  Subjective: Awake, denies abdominal pain Reports being hungry  Objective: Vital signs in last 24 hours: Temp:  [97.8 F (36.6 C)-98.7 F (37.1 C)] 98.2 F (36.8 C) (10/06 0400) Pulse Rate:  [80-108] 88 (10/06 0700) Resp:  [8-21] 10 (10/06 0700) BP: (129-189)/(64-129) 159/79 (10/06 0600) SpO2:  [89 %-100 %] 96 % (10/06 0700) Weight:  [104.4 kg (230 lb 2.6 oz)] 104.4 kg (230 lb 2.6 oz) (10/06 0419) Last BM Date: 12/30/15  Intake/Output from previous day: 10/05 0701 - 10/06 0700 In: 482.4 [I.V.:242.4] Out: 1525 [Urine:1525] Intake/Output this shift: No intake/output data recorded.  Exam: Awake and alert Comfortable in appearance Abdomen soft, incision clean  Lab Results:   Recent Labs  01/05/16 0429 01/06/16 0247  WBC 16.8* 14.6*  HGB 8.3* 8.3*  HCT 26.5* 27.5*  PLT 321 363   BMET  Recent Labs  01/05/16 0429 01/06/16 0247  NA 141 144  K 4.5 4.7  CL 106 110  CO2 20* 20*  GLUCOSE 111* 83  BUN 43* 34*  CREATININE 2.34* 2.12*  CALCIUM 8.0* 8.6*   PT/INR No results for input(s): LABPROT, INR in the last 72 hours. ABG  Recent Labs  01/05/16 0327  PHART 7.372  HCO3 18.4*    Studies/Results: Dg Chest Port 1 View  Result Date: 01/05/2016 CLINICAL DATA:  Respiratory failure, interval extubation since the earlier study. EXAM: PORTABLE CHEST 1 VIEW COMPARISON:  Portable chest x-ray of January 03, 2016 FINDINGS: The lungs remain mildly hypoinflated. Bibasilar atelectasis or pneumonia with small bilateral pleural effusions remain. The cardiac silhouette remains enlarged. The central pulmonary vascularity is mildly engorged. The interstitial markings are coarse. The esophagogastric tube tip projects below the inferior margin of the image. The right internal jugular venous catheter tip projects over the junction of the middle and distal thirds of the SVC. IMPRESSION: CHF with mild pulmonary vascular congestion and interstitial edema. Interval  extubation with persistent atelectasis or pneumonia with small bilateral pleural effusions. There has not been significant interval change since the pre extubation study of 03 January 2016. Electronically Signed   By: David  Martinique M.D.   On: 01/05/2016 07:51    Anti-infectives: Anti-infectives    Start     Dose/Rate Route Frequency Ordered Stop   01/01/16 0800  piperacillin-tazobactam (ZOSYN) IVPB 3.375 g     3.375 g 12.5 mL/hr over 240 Minutes Intravenous Every 8 hours 01/01/16 0746        Assessment/Plan: s/p Procedure(s): REPAIR INCARCERATED VENTRAL HERNIA, POSSIBLE BOWEL RESECTION (N/A) Partial OMENTECTOMY  Will advance diet as tolerated.   LOS: 7 days    Leana Springston A 01/06/2016

## 2016-01-06 NOTE — Progress Notes (Signed)
01/06/2016 1445 Pt. C/o increasing pain on left hand. Hand warm, doppler pulses present, consistent with assessment from this AM. Dr. Donzetta Matters with [facility] paged and made aware of findings. No new orders at this time. Only to continue pain management. Orders enacted. Will continue to closely monitor patient.  Stephanie Griffith, [place] [place]

## 2016-01-06 NOTE — Progress Notes (Signed)
Pt ready for transport. Daughter [name] called and did not answer, daughter [name] called and informed of transfer to [place]. Patient A and Oriented to self only, vital signs stable.

## 2016-01-06 NOTE — Progress Notes (Signed)
  Progress Note    01/06/2016 8:19 AM 6 Days Post-Op  Subjective:  Left hand without pain  Vitals:   01/06/16 0700 01/06/16 0718  BP:    Pulse: 88 91  Resp: 10 11  Temp:      Physical Exam: More oriented this a.m Left arm palpable brachial pulse Multiphasic signal at left ulnar and signal at distal radial artery Signal of palmar arch stronger this a.m.  CBC    Component Value Date/Time   WBC 14.6 (H) 01/06/2016 0247   RBC 2.93 (L) 01/06/2016 0247   HGB 8.3 (L) 01/06/2016 0247   HCT 27.5 (L) 01/06/2016 0247   PLT 363 01/06/2016 0247   MCV 93.9 01/06/2016 0247   MCH 28.3 01/06/2016 0247   MCHC 30.2 01/06/2016 0247   RDW 14.0 01/06/2016 0247   LYMPHSABS 1.0 12/29/2015 2154   MONOABS 0.5 12/29/2015 2154   EOSABS 0.0 12/29/2015 2154   BASOSABS 0.0 12/29/2015 2154    BMET    Component Value Date/Time   NA 144 01/06/2016 0247   K 4.7 01/06/2016 0247   CL 110 01/06/2016 0247   CO2 20 (L) 01/06/2016 0247   GLUCOSE 83 01/06/2016 0247   BUN 34 (H) 01/06/2016 0247   CREATININE 2.12 (H) 01/06/2016 0247   CALCIUM 8.6 (L) 01/06/2016 0247   GFRNONAA 22 (L) 01/06/2016 0247   GFRAA 26 (L) 01/06/2016 0247    INR    Component Value Date/Time   INR 1.06 12/30/2015 1340     Intake/Output Summary (Last 24 hours) at 01/06/16 0819 Last data filed at 01/06/16 0600  Gross per 24 hour  Intake           459.92 ml  Output             1425 ml  Net          -965.08 ml     Assessment:  70 y.o. female is s/p ventral hernia repair with noted duskiness of left fingertips post radial arterial line removal. Duplex yesterday demonstrated occlusion of distal radial artery with in tact arch  Plan: Given that she is mostly asymptomatic at this time it is probably ok to stop the heparin gtt and restart her asa when ok per general surgery. May benefit from lue angiogram with possible intervention if renal function allows but most likely this is a combination of vasopressors and  possible microemboli from her radial artery occlusion that not be amenable to repair at this point.    Jynesis Nakamura C. Donzetta Matters, MD [facility] Office: [phone] Pager: [phone]  01/06/2016 8:19 AM

## 2016-01-06 NOTE — Evaluation (Signed)
Physical Therapy Evaluation Patient Details Name: Stephanie Griffith MRN: [id number] DOB: 10-Sep-1945 Today's Date: 01/06/2016   History of Present Illness  70 yo admitted with N/V now s/p ventral hernia repair, LUE ischemia due to radial artery occulsion after arterial line pulled, confusion post operatively. PMHx: Lt AKA, HTN, CKD, PVD  Clinical Impression  Pt pleasant but confused. Pt aware of place but having difficulty following single step commands, perseverating on brakes on chair. Pt also demonstrates decreased vision and bil UE coordination as unable to successfully bring cup to mouth with both hands despite cues. Pt with decreased strength, function, cognition, transfers and mobility who will benefit from acute therapy to maximize mobility and independence to decrease burden of care. Pt with 7 children who work and many who are in the medical field, all eager for her to return to baseline mod I WC level. Will follow acutely recommend lift or 2 person assist with stedy for nursing transfers.   HR 108 sats 96% on RA    Follow Up Recommendations SNF;Supervision/Assistance - 24 hour    Equipment Recommendations  None recommended by PT    Recommendations for Other Services OT consult     Precautions / Restrictions Precautions Precautions: Fall Precaution Comments: Lt AKA      Mobility  Bed Mobility Overal bed mobility: Needs Assistance Bed Mobility: Supine to Sit     Supine to sit: Min assist;HOB elevated     General bed mobility comments: cues for sequence with use of rail, HOB 35 degrees and increased time to pivot to EOB. max +2 to scoot back in chair  Transfers Overall transfer level: Needs assistance   Transfers: Squat Pivot Transfers     Squat pivot transfers: Mod assist;+2 safety/equipment;+2 physical assistance;From elevated surface     General transfer comment: cues for sequence, hand placement and assist to block knee, provide anterior translation and control  pelvis to chair. Pt not fully pivoting with pivot and had one hip partially on arm rest with max +2 to scoot off of rail and back in chairl  Ambulation/Gait                Stairs            Wheelchair Mobility    Modified Rankin (Stroke Patients Only)       Balance Overall balance assessment: Needs assistance   Sitting balance-Leahy Scale: Fair                                       Pertinent Vitals/Pain Pain Assessment: No/denies pain    Home Living Family/patient expects to be discharged to:: Private residence Living Arrangements: Children Available Help at Discharge: Available 24 hours/day Type of Home: Apartment Home Access: Level entry     Home Layout: One level Home Equipment: Wheelchair - manual Additional Comments: pt lives in garage apartment at daughters house and is self sufficient at [facility] level    Prior Function Level of Independence: Independent with assistive device(s)               Hand Dominance        Extremity/Trunk Assessment   Upper Extremity Assessment: Generalized weakness (2/5 grip bil UE)           Lower Extremity Assessment: Generalized weakness      Cervical / Trunk Assessment: Kyphotic  Communication   Communication: No difficulties  Cognition Arousal/Alertness:  Awake/alert Behavior During Therapy: Flat affect Overall Cognitive Status: Impaired/Different from baseline Area of Impairment: Orientation;Attention;Memory;Following commands;Safety/judgement;Awareness Orientation Level: Disoriented to;Time Current Attention Level: Sustained Memory: Decreased short-term memory Following Commands: Follows one step commands with increased time;Follows one step commands inconsistently Safety/Judgement: Decreased awareness of safety;Decreased awareness of deficits          General Comments      Exercises     Assessment/Plan    PT Assessment Patient needs continued PT services  PT Problem List  Decreased strength;Decreased mobility;Decreased safety awareness;Decreased coordination;Decreased activity tolerance;Decreased cognition;Decreased balance          PT Treatment Interventions DME instruction;Therapeutic activities;Cognitive remediation;Patient/family education;Wheelchair mobility training;Balance training;Therapeutic exercise;Functional mobility training    PT Goals (Current goals can be found in the Care Plan section)  Acute Rehab PT Goals Patient Stated Goal: return home PT Goal Formulation: With patient/family Time For Goal Achievement: 01/20/16 Potential to Achieve Goals: Good    Frequency Min 3X/week   Barriers to discharge Decreased caregiver support      Co-evaluation               End of Session Equipment Utilized During Treatment: Gait belt Activity Tolerance: Patient tolerated treatment well Patient left: in chair;with call bell/phone within reach;with chair alarm set;with family/visitor present Nurse Communication: Mobility status;Need for lift equipment;Precautions         Time: 0935-1000 PT Time Calculation (min) (ACUTE ONLY): 25 min   Charges:   PT Evaluation $PT Eval Moderate Complexity: 1 Procedure PT Treatments $Therapeutic Activity: 8-22 mins   PT G Codes:        Melford Aase 01/06/2016, 10:21 AM Elwyn Reach, [place]

## 2016-01-06 NOTE — Progress Notes (Signed)
ANTICOAGULATION CONSULT NOTE   Pharmacy Consult for Heparin Indication: ischemic left upper extremity   Assessment: 51 YOF admitted for hernia repair, now with ischemia of left distal fingertips.  Heparin level = 0.51 CBC stable  Goal of Therapy:  Heparin level 0.3-0.7 units/ml Monitor platelets by anticoagulation protocol: Yes   Plan:  Heparin at 1000 units / hr Follow up AM labs    No Known Allergies  Patient Measurements: Height: 5\' 8"  (172.7 cm) Weight: 230 lb 2.6 oz (104.4 kg) IBW/kg (Calculated) : 63.9 Heparin Dosing Weight: 86kg  Vital Signs: Temp: 98 F (36.7 C) (10/06 0900) Temp Source: Oral (10/06 0900) BP: 161/87 (10/06 0800) Pulse Rate: 87 (10/06 0900)  Labs:  Recent Labs  01/04/16 0320 01/05/16 0429 01/05/16 0804 01/05/16 1926 01/06/16 0247  HGB 8.5* 8.3*  --   --  8.3*  HCT 27.4* 26.5*  --   --  27.5*  PLT 271 321  --   --  363  HEPARINUNFRC  --   --  0.80* 0.67 0.51  CREATININE 2.65* 2.34*  --   --  2.12*    Estimated Creatinine Clearance: 31.2 mL/min (by C-G formula based on SCr of 2.12 mg/dL (H)).   Thank you Anette Guarneri, PharmD [phone] -01/06/2016 10:05 AM

## 2016-01-06 NOTE — Progress Notes (Signed)
Patient Name: Stephanie Griffith Date of Encounter: 01/06/2016  Primary Cardiologist: [facility] Problem List     Principal Problem:   Incarcerated hernia Active Problems:   Essential hypertension, benign   Chronic kidney disease (CKD), stage III (moderate)   Abdominal pain   Hyperglycemia   Acute on chronic renal failure (HCC)   PAD (peripheral artery disease) (HCC)   Ventral hernia   Hyperkalemia   Acute hypoxemic respiratory failure (HCC)   Acute pulmonary edema (HCC)   Pressure injury of skin     Subjective   Awake. Mildly confused. Working with PT to get to chair  Inpatient Medications    Scheduled Meds: . diltiazem  20 mg Intravenous Once  . famotidine (PEPCID) IV  20 mg Intravenous Q24H  . feeding supplement (VITAL HIGH PROTEIN)  1,000 mL Per Tube Q24H  . Influenza vac split quadrivalent PF  0.5 mL Intramuscular Tomorrow-1000  . insulin aspart  0-15 Units Subcutaneous Q4H  . ipratropium  0.5 mg Nebulization Q6H  . mouth rinse  15 mL Mouth Rinse QID  . metoprolol  10 mg Intravenous Q6H  . piperacillin-tazobactam (ZOSYN)  IV  3.375 g Intravenous Q8H  . pneumococcal 23 valent vaccine  0.5 mL Intramuscular Tomorrow-1000  . sodium chloride flush  10-40 mL Intracatheter Q12H   Continuous Infusions: . diltiazem (CARDIZEM) infusion Stopped (01/01/16 1930)  . heparin 1,000 Units/hr (01/05/16 2100)  . phenylephrine (NEO-SYNEPHRINE) Adult infusion Stopped (01/03/16 0945)   PRN Meds:.[DISCONTINUED] acetaminophen **OR** acetaminophen, fentaNYL (SUBLIMAZE) injection, hydrALAZINE, HYDROmorphone (DILAUDID) injection, labetalol, morphine injection, ondansetron **OR** ondansetron (ZOFRAN) IV, sodium chloride flush   Vital Signs    Vitals:   01/06/16 0700 01/06/16 0718 01/06/16 0800 01/06/16 0900  BP:   (!) 161/87   Pulse: 88 91 82 87  Resp: 10 11 10 11   Temp:    98 F (36.7 C)  TempSrc:    Oral  SpO2: 96% 95% 95% 96%  Weight:      Height:         Intake/Output Summary (Last 24 hours) at 01/06/16 0955 Last data filed at 01/06/16 0900  Gross per 24 hour  Intake           447.42 ml  Output             1625 ml  Net         -1177.58 ml   Filed Weights   01/04/16 0445 01/05/16 0500 01/06/16 0419  Weight: 225 lb 12 oz (102.4 kg) 230 lb 6.1 oz (104.5 kg) 230 lb 2.6 oz (104.4 kg)    Physical Exam    GEN: Awake HEENT: Grossly normal.  Neck: Supple, no JVD, carotid bruits, or masses. Cardiac: RRR, no murmurs, rubs, or gallops.  Respiratory:  Respirations regular and unlabored, clear to auscultation bilaterally. GI: hypo BS. MS: no deformity or atrophy. Skin:, left hand finger stable Neuro:  Unable to assess strength, sedate. Psych: mildly confused.  Labs    CBC  Recent Labs  01/05/16 0429 01/06/16 0247  WBC 16.8* 14.6*  HGB 8.3* 8.3*  HCT 26.5* 27.5*  MCV 92.7 93.9  PLT 321 811   Basic Metabolic Panel  Recent Labs  01/05/16 0429 01/06/16 0247  NA 141 144  K 4.5 4.7  CL 106 110  CO2 20* 20*  GLUCOSE 111* 83  BUN 43* 34*  CREATININE 2.34* 2.12*  CALCIUM 8.0* 8.6*  MG 2.4 2.2  PHOS 3.8 3.4    Recent Labs  01/04/16 0320  TRIG 169*    Telemetry    Pac's, now sinus rhythm - Personally Reviewed  ECG    AFIB RVR with TWI lateral precordial - Personally Reviewed  Radiology    Dg Chest Port 1 View  Result Date: 01/05/2016 CLINICAL DATA:  Respiratory failure, interval extubation since the earlier study. EXAM: PORTABLE CHEST 1 VIEW COMPARISON:  Portable chest x-ray of January 03, 2016 FINDINGS: The lungs remain mildly hypoinflated. Bibasilar atelectasis or pneumonia with small bilateral pleural effusions remain. The cardiac silhouette remains enlarged. The central pulmonary vascularity is mildly engorged. The interstitial markings are coarse. The esophagogastric tube tip projects below the inferior margin of the image. The right internal jugular venous catheter tip projects over the junction of the  middle and distal thirds of the SVC. IMPRESSION: CHF with mild pulmonary vascular congestion and interstitial edema. Interval extubation with persistent atelectasis or pneumonia with small bilateral pleural effusions. There has not been significant interval change since the pre extubation study of 03 January 2016. Electronically Signed   By: David  Martinique M.D.   On: 01/05/2016 07:51     Cardiac Studies   2014 - ECHO normal EF, mildly dilated LA  Patient Profile     69 year old with PAF with RVR post op, PAT.  Assessment & Plan    Atrial fibrillation Parox.  -Now NSR, PAC's, no long term anticoagulation at this point. PAT noted on tele. If clear AFIB returns, may need anticoagulation.   - tolerating increased metop 10 IV Q6. Change to PO when able. (might consider metoprolol 100mg  BID)  - EF on ECHO normal/ hyperdynamic.    Acute respiratory failure  - underlying COPD   - Reassuring EF  - Successful extubation  Cyanosis left finger tips   - radial art occl. Vascular surg on board. No left hand pain. Palmar arch intact. No repair.   Will sign off. Please let us know if any ?   Signed, Candee Furbish, MD  01/06/2016, 9:55 AM

## 2016-01-06 NOTE — Care Management Important Message (Signed)
Important Message  Patient Details  Name: Stephanie Griffith MRN: [id number] Date of Birth: 05-29-1945   Medicare Important Message Given:  Yes    Nathen May 01/06/2016, 1:02 PM

## 2016-01-06 NOTE — Care Management Note (Signed)
Case Management Note  Patient Details  Name: MOLLEE NEER MRN: [id number] Date of Birth: 07-23-1945  Subjective/Objective:   Pt lives with daughter and grandson who both work, was independent PTA.  PT recommends ST-SNF for rehab, will consult CSW.                           Expected Discharge Plan:  Skilled Nursing Facility  In-House Referral:  Clinical Social Work  Discharge planning Services  CM Consult  Status of Service:  In process, will continue to follow  Girard Cooter, RN 01/06/2016, 11:39 AM

## 2016-01-07 LAB — MAGNESIUM: Magnesium: 2.1 mg/dL (ref 1.7–2.4)

## 2016-01-07 LAB — CBC
HCT: 27.8 % — ABNORMAL LOW (ref 36.0–46.0)
Hemoglobin: 8.6 g/dL — ABNORMAL LOW (ref 12.0–15.0)
MCH: 28.8 pg (ref 26.0–34.0)
MCHC: 30.9 g/dL (ref 30.0–36.0)
MCV: 93 fL (ref 78.0–100.0)
Platelets: 395 10*3/uL (ref 150–400)
RBC: 2.99 MIL/uL — ABNORMAL LOW (ref 3.87–5.11)
RDW: 14 % (ref 11.5–15.5)
WBC: 17.5 10*3/uL — ABNORMAL HIGH (ref 4.0–10.5)

## 2016-01-07 LAB — BASIC METABOLIC PANEL
Anion gap: 11 (ref 5–15)
BUN: 29 mg/dL — ABNORMAL HIGH (ref 6–20)
CO2: 20 mmol/L — ABNORMAL LOW (ref 22–32)
Calcium: 8.8 mg/dL — ABNORMAL LOW (ref 8.9–10.3)
Chloride: 111 mmol/L (ref 101–111)
Creatinine, Ser: 2.2 mg/dL — ABNORMAL HIGH (ref 0.44–1.00)
GFR calc Af Amer: 25 mL/min — ABNORMAL LOW (ref >60)
GFR calc non Af Amer: 21 mL/min — ABNORMAL LOW (ref >60)
Glucose, Bld: 98 mg/dL (ref 65–99)
Potassium: 4.8 mmol/L (ref 3.5–5.1)
Sodium: 142 mmol/L (ref 135–145)

## 2016-01-07 LAB — CULTURE, BLOOD (ROUTINE X 2)
Culture: NO GROWTH
Culture: NO GROWTH

## 2016-01-07 LAB — GLUCOSE, CAPILLARY
Glucose-Capillary: 90 mg/dL (ref 65–99)
Glucose-Capillary: 95 mg/dL (ref 65–99)
Glucose-Capillary: 129 mg/dL — ABNORMAL HIGH (ref 65–99)
Glucose-Capillary: 80 mg/dL (ref 65–99)
Glucose-Capillary: 132 mg/dL — ABNORMAL HIGH (ref 65–99)
Glucose-Capillary: 70 mg/dL (ref 65–99)

## 2016-01-07 LAB — HEPARIN LEVEL (UNFRACTIONATED): Heparin Unfractionated: 0.43 IU/mL (ref 0.30–0.70)

## 2016-01-07 LAB — PHOSPHORUS: Phosphorus: 2.8 mg/dL (ref 2.5–4.6)

## 2016-01-07 MED ORDER — LEVALBUTEROL HCL 0.63 MG/3ML IN NEBU: 0.6300 mg | INHALATION_SOLUTION | Freq: Four times a day (QID) | RESPIRATORY_TRACT | Status: DC | PRN

## 2016-01-07 MED ORDER — TRAMADOL HCL 50 MG PO TABS
100.0000 mg | ORAL_TABLET | Freq: Two times a day (BID) | ORAL | Status: DC | PRN
  Administered 2016-01-08: 100 mg via ORAL
  Filled 2016-01-07: qty 2

## 2016-01-07 NOTE — Progress Notes (Signed)
Progress Note: General Surgery Service   Subjective: Nauseated, no BM or flatus  Objective: Vital signs in last 24 hours: Temp:  [97.3 F (36.3 C)-98.2 F (36.8 C)] 98.1 F (36.7 C) (10/07 0909) Pulse Rate:  [85-107] 93 (10/07 0715) Resp:  [11-23] 19 (10/07 0715) BP: (141-185)/(58-89) 185/89 (10/07 0715) SpO2:  [95 %-100 %] 97 % (10/07 0715) Weight:  [99.7 kg (219 lb 12.8 oz)] 99.7 kg (219 lb 12.8 oz) (10/07 0500) Last BM Date: 12/30/15  Intake/Output from previous day: 10/06 0701 - 10/07 0700 In: 230 [I.V.:220] Out: 695 [Urine:695] Intake/Output this shift: Total I/O In: 540 [P.O.:540] Out: -   Gen: rude  Lungs: CTAB  Cardiovascular: irreg irreg  Abd: soft, ATTP, wound c/d/i  Extremities: left AKA  Neuro: AOx4  Lab Results: CBC   Recent Labs  01/06/16 0247 01/07/16 0500  WBC 14.6* 17.5*  HGB 8.3* 8.6*  HCT 27.5* 27.8*  PLT 363 395   BMET  Recent Labs  01/06/16 0247 01/07/16 0500  NA 144 142  K 4.7 4.8  CL 110 111  CO2 20* 20*  GLUCOSE 83 98  BUN 34* 29*  CREATININE 2.12* 2.20*  CALCIUM 8.6* 8.8*   PT/INR No results for input(s): LABPROT, INR in the last 72 hours. ABG  Recent Labs  01/05/16 0327  PHART 7.372  HCO3 18.4*    Studies/Results:  Anti-infectives: Anti-infectives    Start     Dose/Rate Route Frequency Ordered Stop   01/01/16 0800  piperacillin-tazobactam (ZOSYN) IVPB 3.375 g  Status:  Discontinued     3.375 g 12.5 mL/hr over 240 Minutes Intravenous Every 8 hours 01/01/16 0746 01/07/16 1035      Medications: Scheduled Meds: . diltiazem  20 mg Intravenous Once  . famotidine (PEPCID) IV  20 mg Intravenous Q24H  . feeding supplement (VITAL HIGH PROTEIN)  1,000 mL Per Tube Q24H  . Influenza vac split quadrivalent PF  0.5 mL Intramuscular Tomorrow-1000  . insulin aspart  0-15 Units Subcutaneous Q4H  . mouth rinse  15 mL Mouth Rinse QID  . metoprolol  10 mg Intravenous Q6H  . pneumococcal 23 valent vaccine  0.5 mL  Intramuscular Tomorrow-1000  . sodium chloride flush  10-40 mL Intracatheter Q12H   Continuous Infusions: . diltiazem (CARDIZEM) infusion Stopped (01/01/16 1930)  . heparin 1,000 Units/hr (01/06/16 2000)   PRN Meds:.[DISCONTINUED] acetaminophen **OR** acetaminophen, fentaNYL (SUBLIMAZE) injection, hydrALAZINE, HYDROmorphone (DILAUDID) injection, labetalol, levalbuterol, morphine injection, ondansetron **OR** ondansetron (ZOFRAN) IV, sodium chloride flush  Assessment/Plan: Patient Active Problem List   Diagnosis Date Noted  . Pressure injury of skin 01/03/2016  . Acute hypoxemic respiratory failure ([place])   . Acute pulmonary edema (HCC)   . Hyperkalemia 12/30/2015  . Incarcerated hernia 12/30/2015  . Phantom limb pain ([place]) 08/13/2011  . PVD (peripheral vascular disease) with claudication ([place]) 07/26/2011  . S/P AKA (above knee amputation) ([place]) 06/15/2011  . NSTEMI (non-ST elevated myocardial infarction) ([place]) 06/09/2011  . Acute diastolic heart failure ([place]) 06/09/2011  . Atrial fibrillation ([place]) 06/08/2011  . Cholelithiasis and cholecystitis without obstruction 06/03/2011  . Ventral hernia 06/03/2011  . Acute on chronic renal failure ([place]) 06/02/2011  . PAD (peripheral artery disease) ([place]) 06/02/2011  . Dry gangrene ([place]) 05/31/2011  . Abdominal pain 05/31/2011  . Leukocytosis 05/31/2011  . Hyponatremia 05/31/2011  . Hyperglycemia 05/31/2011  . ANEMIA-NOS 12/22/2007  . Chronic kidney disease (CKD), stage III (moderate) 12/22/2007  . CHEST PAIN, HX OF 12/22/2007  . Essential hypertension, benign 12/10/2007  s/p Procedure(s): REPAIR INCARCERATED VENTRAL HERNIA, POSSIBLE BOWEL RESECTION Partial OMENTECTOMY 12/31/2015 -continue diet as tolerated -stop empiric antibiotics -continue to assess  Creatinine slightly increased - continue fluids -will get hospitalist involved for assistance in multiple chronic medical issues   LOS: 8 days   Mickeal Skinner, MD Pg# [phone] [facility]

## 2016-01-07 NOTE — Progress Notes (Signed)
Spoke with Gerald Stabs, [facility], about getting a Air cabin crew for pt due to confusion and trying to climb out of bed. Pt has order for Air cabin crew. AC stated she did not have one available at this time but would send one if possible later in the day. Consuelo Pandy RN

## 2016-01-07 NOTE — Consult Note (Signed)
[facility]  Subjective  - pt confused   Objective (!) 185/89 93 98.1 F (36.7 C) (Oral) 19 97%  Intake/Output Summary (Last 24 hours) at 01/07/16 1004 Last data filed at 01/07/16 4166  Gross per 24 hour  Intake              740 ml  Output              440 ml  Net              300 ml   Left 2nd digit tip with dry gangrene, digit 3-5 less affected Pt complains of pain in tip of finger but hand well perfused  Assessment/Planning: Left hand digit ischemia agree with Dr Donzetta Matters multifactorial aline, pressor, embolic Pain control for now Will consider involving hand service for sympathectomy or amputation of digit if pain persists in the long term Dr Donzetta Matters will follow up with pt on Monday  [name] 01/07/2016 10:04 AM --  Laboratory Lab Results:  Recent Labs  01/06/16 0247 01/07/16 0500  WBC 14.6* 17.5*  HGB 8.3* 8.6*  HCT 27.5* 27.8*  PLT 363 395   BMET  Recent Labs  01/06/16 0247 01/07/16 0500  NA 144 142  K 4.7 4.8  CL 110 111  CO2 20* 20*  GLUCOSE 83 98  BUN 34* 29*  CREATININE 2.12* 2.20*  CALCIUM 8.6* 8.8*    COAG Lab Results  Component Value Date   INR 1.06 12/30/2015   INR 1.13 06/04/2011   No results found for: PTT

## 2016-01-07 NOTE — Progress Notes (Signed)
ANTICOAGULATION CONSULT NOTE   Pharmacy Consult for Heparin Indication: ischemic left upper extremity   Assessment: 62 YOF admitted for hernia repair, now with ischemia of left distal fingertips.  Heparin level = 0.43 CBC stable  Goal of Therapy:  Heparin level 0.3-0.7 units/ml Monitor platelets by anticoagulation protocol: Yes   Plan:  Heparin at 1000 units / hr Follow up AM labs    No Known Allergies  Patient Measurements: Height: 5\' 8"  (172.7 cm) Weight: 219 lb 12.8 oz (99.7 kg) IBW/kg (Calculated) : 63.9 Heparin Dosing Weight: 86kg  Vital Signs: Temp: 97.8 F (36.6 C) (10/07 0330) Temp Source: Oral (10/07 0330) BP: 167/73 (10/07 0330) Pulse Rate: 102 (10/06 2345)  Labs:  Recent Labs  01/05/16 0429  01/05/16 1926 01/06/16 0247 01/07/16 0500  HGB 8.3*  --   --  8.3* 8.6*  HCT 26.5*  --   --  27.5* 27.8*  PLT 321  --   --  363 395  HEPARINUNFRC  --   < > 0.67 0.51 0.43  CREATININE 2.34*  --   --  2.12* 2.20*  < > = values in this interval not displayed.  Estimated Creatinine Clearance: 29.4 mL/min (by C-G formula based on SCr of 2.2 mg/dL (H)).   Thank you Anette Guarneri, PharmD [phone] -01/07/2016 7:47 AM

## 2016-01-07 NOTE — Progress Notes (Addendum)
[facility] PROGRESS NOTE  Stephanie Griffith GEX:[id number] DOB: 1945-08-24 DOA: 12/30/2015 PCP: Kandice Hams, MD  Assessment/Plan: 1. Incarcerated ventral hernia /sbo w/ leukocytosis, status post repair with a partial ometectomy/bowel resection on 12/31/2015,  1. - currently off empiric antibiotics.  -  diet by general surgery  -  she downgraded from the ICU  to stepdown unit last night  2. ams -  worse last night with severe psychosis, agitation, not mental status, suspect due to on morphine, which is now an allergy  - her mentation is much improved since cessation of morphine  - Patient denies pain at this time  3. Left hand digit ischemia, suspect  multifactorial due to A-line,  Pressors,  embolic events. She also new onset atrial fib.   - She was seen by vascular surgery today and they will follow up on Monday for further workup  - hep gtt per vasc surg.  4. Acute hypoxic respiratory failure secondary to unable to protect airways related to surgery with acute pul, edema/volume overload, possible COPD with a history of heavy tobacco smoking in the past.   - improved, downgraded by Pul/CCM last night  - She currently is on nasal cannula, doing well. Patient is auto diuresing so diuretics have been on hold and nebs as needed when necessary as well. Aggressive pulmonary toilet  5.  New onset atrial fibrillation with RVR  - Followed by cardiology. Currently off Cardizem drip due to hypotension related to sedation. She is now sinus rhythm.  - currently metoprolol 10mg  iv q6hrs, trx to po when taking better po.  6.  Hypertension, stable  7. History of demand ischemia in the ICU.   - will defer to cardiology  8.  aki on ckd    - baseline cr 1.4 range, slightly above nml today,  - currently off iv diuretics, and autodiuresing, strict  I/O's  - arb on hold.  - am labs  9. Anemia, chronic disease and acute blood loss  - trf for hb <7, hh remains stable    10. Hx of left AKA,  wheelchair bound, but independent of ADLs prior to admission.    d/w Dr Kieth Brightly, gs, who called [place] about the patient's downgraded status.   Spoke w/ rn  Code Status: full Family Communication: none Disposition Plan: depends on further progress  DVT ppx: Hep gtt  Consultants:  Gs, vas surg, cardiology, pccm (signed off)  Procedures: status post repair with a partial ometectomy/bowel resection on 12/31/2015,   Antibiotics: Anti-infectives    Start     Dose/Rate Route Frequency Ordered Stop   01/01/16 0800  piperacillin-tazobactam (ZOSYN) IVPB 3.375 g  Status:  Discontinued     3.375 g 12.5 mL/hr over 240 Minutes Intravenous Every 8 hours 01/01/16 0746 01/07/16 1035      HPI/Subjective: 70 year old female with medical history significant for hypertension, CAD, peripheral vascular disease status post left above-the-knee amputation 2013, history of hyperglycemia but no diagnosis of diabetes, umbilical hernia, presents to emergency room for abdominal pains. With 4 episodes of nausea and vomiting. Of note, she was subsequently found to have an incarcerated ventral hernia and was transferred to ICU for further intensive care as well as surgical evaluation and treatment.  Objective: Vitals:   01/07/16 1400 01/07/16 1431  BP:  (!) 153/70  Pulse: 94 95  Resp: 20 19  Temp:  98.5 F (36.9 C)    Intake/Output Summary (Last 24 hours) at 01/07/16 1543 Last data filed at 01/07/16 1400  Gross per 24 hour  Intake             1240 ml  Output              585 ml  Net              655 ml   Filed Weights   01/05/16 0500 01/06/16 0419 01/07/16 0500  Weight: 104.5 kg (230 lb 6.1 oz) 104.4 kg (230 lb 2.6 oz) 99.7 kg (219 lb 12.8 oz)   BP Readings from Last 3 Encounters:  01/07/16 (!) 153/70  07/15/14 (!) 177/77  08/13/11 (!) 129/50    Exam:   General:   pleasant, NAD, AAOx3. Sitting up in bed eating jellow w/ assistance, pleasant, obese  HEENT:   PERRL. mmm  Cardiovascular:  RRR, no m/r/g. No RLE edema, sp L AKA  Respiratory:  CTA bilaterally, no w/r/r. Normal respiratory effort.  Abdomen: obese, soft, ntnd, midline abd staples, c/d/i  Musculoskeletal:  grossly normal tone BUE/BLE Psychiatric: grossly normal mood and affect, speech fluent and appropriate  Neurologic: grossly non-focal Skin: no rash, no pallor   Data Reviewed: Basic Metabolic Panel:  Recent Labs Lab 01/03/16 0903 01/04/16 0320 01/05/16 0429 01/06/16 0247 01/07/16 0500  NA 142 139 141 144 142  K 4.6 5.0 4.5 4.7 4.8  CL 113* 110 106 110 111  CO2 20* 17* 20* 20* 20*  GLUCOSE 75 92 111* 83 98  BUN 39* 41* 43* 34* 29*  CREATININE 2.84* 2.65* 2.34* 2.12* 2.20*  CALCIUM 7.7* 8.1* 8.0* 8.6* 8.8*  MG 1.9 1.9 2.4 2.2 2.1  PHOS 5.4* 5.6* 3.8 3.4 2.8   Liver Function Tests: No results for input(s): AST, ALT, ALKPHOS, BILITOT, PROT, ALBUMIN in the last 168 hours. No results for input(s): LIPASE, AMYLASE in the last 168 hours. No results for input(s): AMMONIA in the last 168 hours. CBC:  Recent Labs Lab 01/03/16 0903 01/04/16 0320 01/05/16 0429 01/06/16 0247 01/07/16 0500  WBC 13.3* 16.0* 16.8* 14.6* 17.5*  HGB 8.1* 8.5* 8.3* 8.3* 8.6*  HCT 25.8* 27.4* 26.5* 27.5* 27.8*  MCV 92.1 92.3 92.7 93.9 93.0  PLT 242 271 321 363 395   Cardiac Enzymes:  Recent Labs Lab 12/31/15 1650  TROPONINI 0.29*   BNP (last 3 results) No results for input(s): BNP in the last 8760 hours.  ProBNP (last 3 results) No results for input(s): PROBNP in the last 8760 hours.  CBG:  Recent Labs Lab 01/06/16 1931 01/06/16 2359 01/07/16 0342 01/07/16 0902 01/07/16 1136  GLUCAP 136* 102* 90 95 129*    Recent Results (from the past 240 hour(s))  MRSA PCR Screening     Status: None   Collection Time: 12/30/15  5:49 PM  Result Value Ref Range Status   MRSA by PCR NEGATIVE NEGATIVE Final    Comment:        The GeneXpert MRSA Assay (FDA approved for NASAL specimens only), is one component of  a comprehensive MRSA colonization surveillance program. It is not intended to diagnose MRSA infection nor to guide or monitor treatment for MRSA infections.   Culture, blood (routine x 2)     Status: None   Collection Time: 01/02/16 10:37 AM  Result Value Ref Range Status   Specimen Description BLOOD LEFT HAND  Final   Special Requests IN PEDIATRIC BOTTLE  4.0 CC  Final   Culture NO GROWTH 5 DAYS  Final   Report Status 01/07/2016 FINAL  Final  Culture, blood (routine x  2)     Status: None   Collection Time: 01/02/16 10:44 AM  Result Value Ref Range Status   Specimen Description BLOOD RIGHT HAND  Final   Special Requests IN PEDIATRIC BOTTLE  4.0 CC  Final   Culture NO GROWTH 5 DAYS  Final   Report Status 01/07/2016 FINAL  Final  Culture, respiratory (NON-Expectorated)     Status: None   Collection Time: 01/02/16 11:58 AM  Result Value Ref Range Status   Specimen Description TRACHEAL ASPIRATE  Final   Special Requests NONE  Final   Gram Stain NO WBC SEEN NO ORGANISMS SEEN   Final   Culture Consistent with normal respiratory flora.  Final   Report Status 01/04/2016 FINAL  Final     Studies: No results found.  Scheduled Meds: . famotidine (PEPCID) IV  20 mg Intravenous Q24H  . Influenza vac split quadrivalent PF  0.5 mL Intramuscular Tomorrow-1000  . insulin aspart  0-15 Units Subcutaneous Q4H  . mouth rinse  15 mL Mouth Rinse QID  . metoprolol  10 mg Intravenous Q6H  . pneumococcal 23 valent vaccine  0.5 mL Intramuscular Tomorrow-1000  . sodium chloride flush  10-40 mL Intracatheter Q12H   Continuous Infusions: . heparin 1,000 Units/hr (01/06/16 2000)    Principal Problem:   Incarcerated hernia Active Problems:   Essential hypertension, benign   Chronic kidney disease (CKD), stage III (moderate)   Abdominal pain   Hyperglycemia   Acute on chronic renal failure (HCC)   PAD (peripheral artery disease) (HCC)   Ventral hernia   Hyperkalemia   Acute hypoxemic  respiratory failure (HCC)   Acute pulmonary edema (HCC)   Pressure injury of skin    Time spent: 73mins    Maren Reamer, MD, MBA/MHA  [facility] Pager [phone]. If 7PM-7AM, please contact night-coverage at [email or web address], password [facility] 01/07/2016, 3:43 PM  LOS: 8 days

## 2016-01-08 ENCOUNTER — Inpatient Hospital Stay (HOSPITAL_COMMUNITY): Payer: Medicare Other

## 2016-01-08 LAB — GLUCOSE, CAPILLARY
Glucose-Capillary: 101 mg/dL — ABNORMAL HIGH (ref 65–99)
Glucose-Capillary: 113 mg/dL — ABNORMAL HIGH (ref 65–99)
Glucose-Capillary: 114 mg/dL — ABNORMAL HIGH (ref 65–99)
Glucose-Capillary: 124 mg/dL — ABNORMAL HIGH (ref 65–99)
Glucose-Capillary: 129 mg/dL — ABNORMAL HIGH (ref 65–99)
Glucose-Capillary: 137 mg/dL — ABNORMAL HIGH (ref 65–99)
Glucose-Capillary: 172 mg/dL — ABNORMAL HIGH (ref 65–99)
Glucose-Capillary: 91 mg/dL (ref 65–99)

## 2016-01-08 LAB — CBC WITH DIFFERENTIAL/PLATELET
Basophils Absolute: 0.2 10*3/uL — ABNORMAL HIGH (ref 0.0–0.1)
Basophils Relative: 1 %
Eosinophils Absolute: 0.5 10*3/uL (ref 0.0–0.7)
Eosinophils Relative: 3 %
HCT: 25.6 % — ABNORMAL LOW (ref 36.0–46.0)
Hemoglobin: 7.9 g/dL — ABNORMAL LOW (ref 12.0–15.0)
Lymphocytes Relative: 15 %
Lymphs Abs: 2.3 10*3/uL (ref 0.7–4.0)
MCH: 28.9 pg (ref 26.0–34.0)
MCHC: 30.9 g/dL (ref 30.0–36.0)
MCV: 93.8 fL (ref 78.0–100.0)
Monocytes Absolute: 2.3 10*3/uL — ABNORMAL HIGH (ref 0.1–1.0)
Monocytes Relative: 15 %
Neutro Abs: 9.8 10*3/uL — ABNORMAL HIGH (ref 1.7–7.7)
Neutrophils Relative %: 66 %
Platelets: 391 10*3/uL (ref 150–400)
RBC: 2.73 MIL/uL — ABNORMAL LOW (ref 3.87–5.11)
RDW: 14.3 % (ref 11.5–15.5)
WBC: 15.1 10*3/uL — ABNORMAL HIGH (ref 4.0–10.5)

## 2016-01-08 LAB — BASIC METABOLIC PANEL
Anion gap: 8 (ref 5–15)
BUN: 27 mg/dL — ABNORMAL HIGH (ref 6–20)
CO2: 19 mmol/L — ABNORMAL LOW (ref 22–32)
Calcium: 8.5 mg/dL — ABNORMAL LOW (ref 8.9–10.3)
Chloride: 112 mmol/L — ABNORMAL HIGH (ref 101–111)
Creatinine, Ser: 2.15 mg/dL — ABNORMAL HIGH (ref 0.44–1.00)
GFR calc Af Amer: 26 mL/min — ABNORMAL LOW (ref >60)
GFR calc non Af Amer: 22 mL/min — ABNORMAL LOW (ref >60)
Glucose, Bld: 109 mg/dL — ABNORMAL HIGH (ref 65–99)
Potassium: 4.3 mmol/L (ref 3.5–5.1)
Sodium: 139 mmol/L (ref 135–145)

## 2016-01-08 LAB — PHOSPHORUS: Phosphorus: 3.5 mg/dL (ref 2.5–4.6)

## 2016-01-08 LAB — MAGNESIUM: Magnesium: 1.9 mg/dL (ref 1.7–2.4)

## 2016-01-08 LAB — HEPARIN LEVEL (UNFRACTIONATED): Heparin Unfractionated: 0.31 IU/mL (ref 0.30–0.70)

## 2016-01-08 IMAGING — DX DG CHEST 1V PORT
1 series · 1 of 1 positions shown · non-contrast
Comparison: [DATE] and previous

CLINICAL DATA: Chest pain in the lower sternum. Shortness of
breath. Cough.

EXAM:
PORTABLE CHEST 1 VIEW

[chest ap]
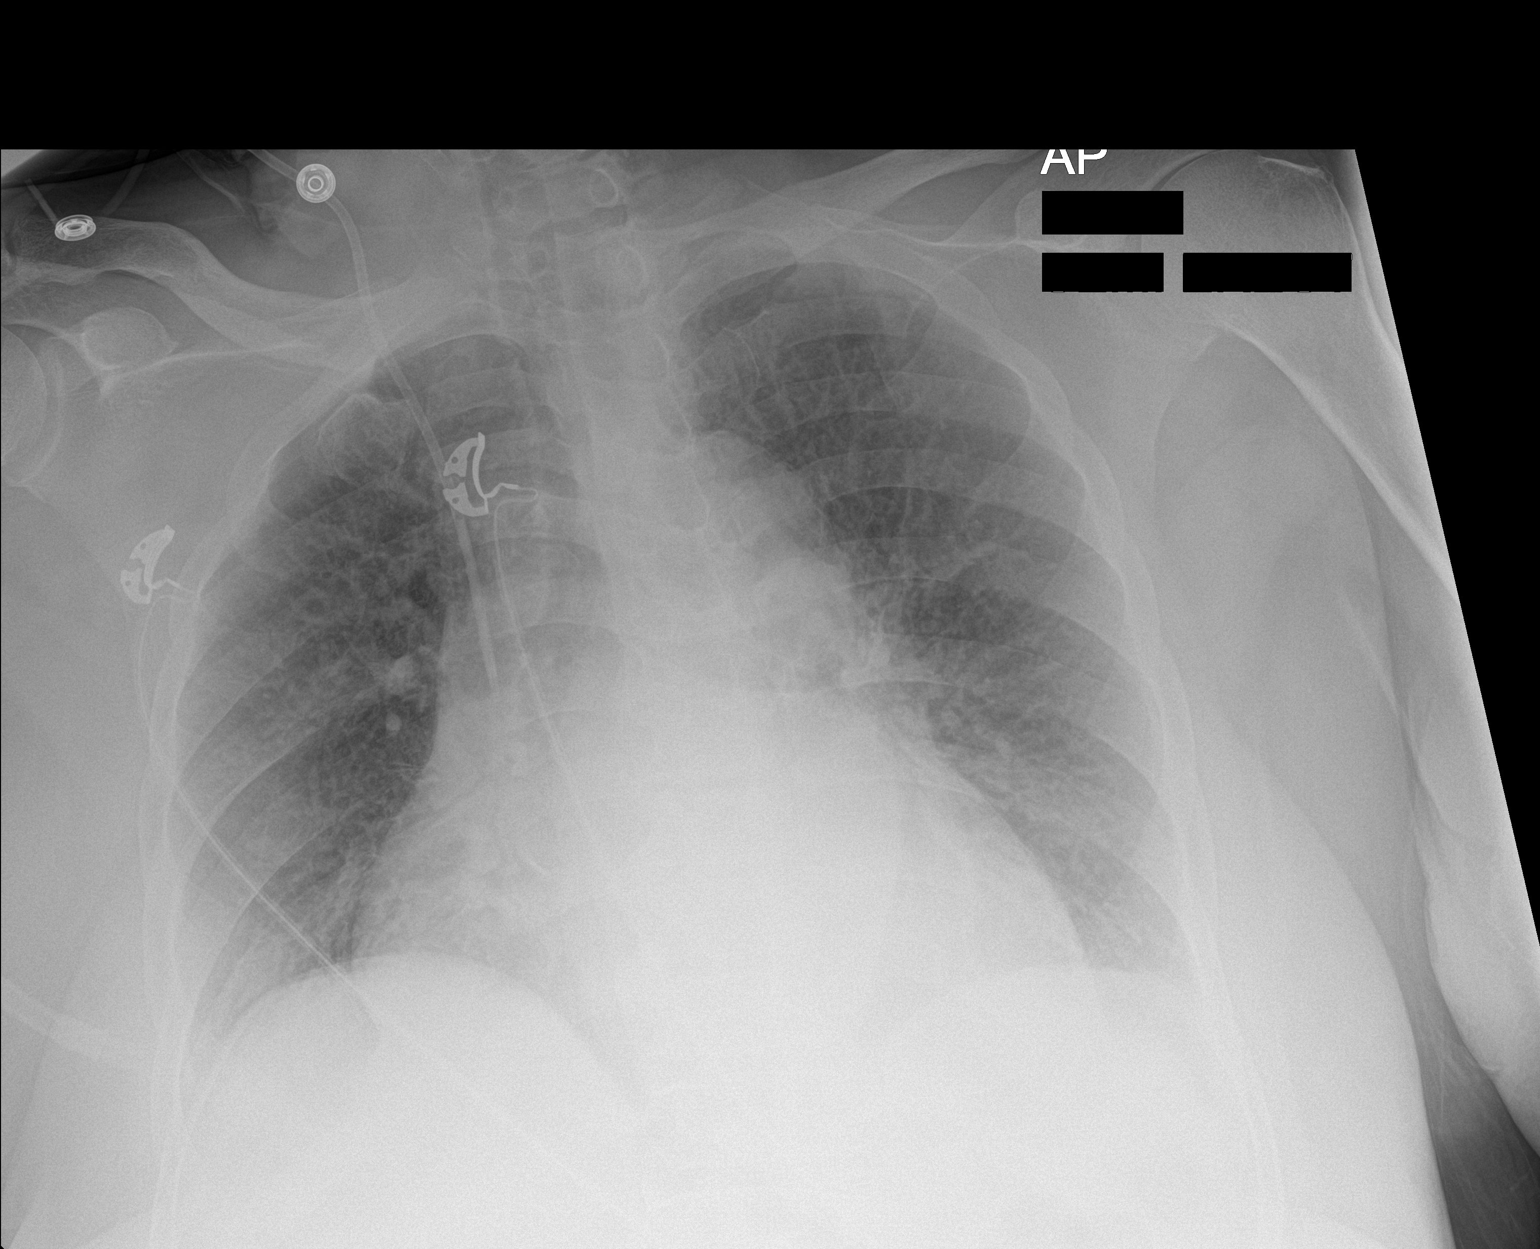

[1 of 1 positions shown; findings below may reference images not displayed]

FINDINGS: Enlarged cardiac silhouette again noted. Central line unchanged with
its tip in the SVC above the right atrium. Nasogastric tube is been
removed. Effusions and lower lobe atelectasis no longer seen. Venous
hypertension persists.
IMPRESSION: Radiographic improvement. Resolution of effusions and lower lobe
atelectasis. Venous hypertension persists.

## 2016-01-08 MED ORDER — FENTANYL CITRATE (PF) 100 MCG/2ML IJ SOLN
12.5000 ug | INTRAMUSCULAR | Status: DC | PRN
  Administered 2016-01-09: 12.5 ug via INTRAVENOUS
  Filled 2016-01-08: qty 2

## 2016-01-08 MED ORDER — TRAMADOL HCL 50 MG PO TABS
50.0000 mg | ORAL_TABLET | Freq: Two times a day (BID) | ORAL | Status: DC | PRN
  Administered 2016-01-08 – 2016-01-10 (×2): 50 mg via ORAL
  Filled 2016-01-08 (×2): qty 1

## 2016-01-08 NOTE — Progress Notes (Signed)
[facility] PROGRESS NOTE  Stephanie Griffith OEV:[id number] DOB: [birth year] DOA: 12/30/2015 PCP: Kandice Hams, MD   Brief Narrative: PCCM Tx to floor 10/75 to [place] 70 year old female with medical history significant for hypertension, CAD, PVD s/p L BKA, history of hyperglycemia but no diagnosis of diabetes, umbilical hernia, presented to emergency room for abdominal pain, N/V subsequently found to have an incarcerated ventral hernia and underwent REPAIR INCARCERATED VENTRAL HERNIA and Partial OMENTECTOMY on 12/31/2015 was transferred to ICU post op and remained on VEnt, had Afib RVR/Demand ischemia-cards was following Also noted to have ischemic changes in L hand fingers, suspected to be from A line vs AFib, VVS consulted, on heparin gtt  Assessment/Plan: 1. Incarcerated ventral hernia /sbo w/ leukocytosis, status post repair with a partial ometectomy/bowel resection on 12/31/2015 -improved -CCS following -tolerating clears  2. Altered mentation/psychosis, agitation -suspected to be from morphine -now resolved, AAOx4 now  3. Left hand digit ischemia, suspect  multifactorial due to A-line,  Pressors,  embolic events. She also new onset atrial fib.  - She was seen by vascular surgery and they will follow up on Monday for further workup - On heparin gtt per vasc surg.  4. Acute hypoxic respiratory failure secondary to unable to protect airways related to surgery with acute pulm edema/volume overload, possible COPD with a history of heavy tobacco smoking in the past.  - improved,  -Patient is auto diuresing so diuretics have been on hold and nebs as needed when necessary as well. Aggressive pulmonary toilet -CXR this am with resolution of effusions and atx -incentive spirometry, monitor  5.  New onset atrial fibrillation with RVR - Followed by cardiology. Currently off Cardizem drip -She is now sinus rhythm. -currently metoprolol 10mg  iv q6hrs, transition to po soon -no long  anticoagulation recommended by cards, unless AFib recurs  6.  Hypertension, stable  7. History of demand ischemia in the ICU. -seen by cards, ECHo with normal EF and wall motion -no ischemic workup recommended at this time per Cards notes -continue Metoprolol -FU with Dr.Hochrein  8. AKI on CKD 3 - baseline cr 1.9 range, since admission consistently in 2 range -diuretics currently on hold -hold ARB  9. Anemia, chronic disease and acute blood loss -Hb in 8range, transfuse if <7  10. Hx of left AKA, wheelchair bound, but independent of ADLs prior to admission.  DVT proph: on heparin gtt  Code Status: full Family Communication: none Disposition Plan: Keep in SDU today  Consultants:  GS, vas surg, cardiology, pccm (signed off)  Procedures: status post repair with a partial ometectomy/bowel resection on 12/31/2015,   Antibiotics: Anti-infectives    Start     Dose/Rate Route Frequency Ordered Stop   01/01/16 0800  piperacillin-tazobactam (ZOSYN) IVPB 3.375 g  Status:  Discontinued     3.375 g 12.5 mL/hr over 240 Minutes Intravenous Every 8 hours 01/01/16 0746 01/07/16 1035      HPI/Subjective: Has pain in finger tips  Objective: Vitals:   01/08/16 0257 01/08/16 0700  BP: (!) 178/89   Pulse: 90   Resp: 14   Temp: 98 F (36.7 C) 98.4 F (36.9 C)    Intake/Output Summary (Last 24 hours) at 01/08/16 1110 Last data filed at 01/07/16 2100  Gross per 24 hour  Intake              780 ml  Output               50 ml  Net  730 ml   Filed Weights   01/06/16 0419 01/07/16 0500 01/08/16 0257  Weight: 104.4 kg (230 lb 2.6 oz) 99.7 kg (219 lb 12.8 oz) 101.5 kg (223 lb 12.3 oz)   BP Readings from Last 3 Encounters:  01/08/16 (!) 178/89  07/15/14 (!) 177/77  08/13/11 (!) 129/50    Exam:   General:   pleasant, NAD, AAOx3. No distress  HEENT:   PERRL. mmm  Cardiovascular: RRR, no m/r/g. No RLE edema, sp L AKA  Respiratory:  CTA bilaterally, no  w/r/r. Normal respiratory effort.  Abdomen: obese, soft, ntnd, midline abd staples, c/d/i  Musculoskeletal:  grossly normal tone BUE/BLE Psychiatric: grossly normal mood and affect, speech fluent and appropriate  Neurologic: grossly non-focal Skin: no rash, no pallor Ext: Left BKA, LUE with ischemic changes in finger tips esp index finger   Data Reviewed: Basic Metabolic Panel:  Recent Labs Lab 01/04/16 0320 01/05/16 0429 01/06/16 0247 01/07/16 0500 01/08/16 0308  NA 139 141 144 142 139  K 5.0 4.5 4.7 4.8 4.3  CL 110 106 110 111 112*  CO2 17* 20* 20* 20* 19*  GLUCOSE 92 111* 83 98 109*  BUN 41* 43* 34* 29* 27*  CREATININE 2.65* 2.34* 2.12* 2.20* 2.15*  CALCIUM 8.1* 8.0* 8.6* 8.8* 8.5*  MG 1.9 2.4 2.2 2.1 1.9  PHOS 5.6* 3.8 3.4 2.8 3.5   Liver Function Tests: No results for input(s): AST, ALT, ALKPHOS, BILITOT, PROT, ALBUMIN in the last 168 hours. No results for input(s): LIPASE, AMYLASE in the last 168 hours. No results for input(s): AMMONIA in the last 168 hours. CBC:  Recent Labs Lab 01/04/16 0320 01/05/16 0429 01/06/16 0247 01/07/16 0500 01/08/16 0308  WBC 16.0* 16.8* 14.6* 17.5* 15.1*  NEUTROABS  --   --   --   --  9.8*  HGB 8.5* 8.3* 8.3* 8.6* 7.9*  HCT 27.4* 26.5* 27.5* 27.8* 25.6*  MCV 92.3 92.7 93.9 93.0 93.8  PLT 271 321 363 395 391   Cardiac Enzymes: No results for input(s): CKTOTAL, CKMB, CKMBINDEX, TROPONINI in the last 168 hours. BNP (last 3 results) No results for input(s): BNP in the last 8760 hours.  ProBNP (last 3 results) No results for input(s): PROBNP in the last 8760 hours.  CBG:  Recent Labs Lab 01/07/16 2314 01/08/16 0103 01/08/16 0300 01/08/16 0530 01/08/16 0741  GLUCAP 70 129* 101* 124* 114*    Recent Results (from the past 240 hour(s))  MRSA PCR Screening     Status: None   Collection Time: 12/30/15  5:49 PM  Result Value Ref Range Status   MRSA by PCR NEGATIVE NEGATIVE Final    Comment:        The GeneXpert MRSA  Assay (FDA approved for NASAL specimens only), is one component of a comprehensive MRSA colonization surveillance program. It is not intended to diagnose MRSA infection nor to guide or monitor treatment for MRSA infections.   Culture, blood (routine x 2)     Status: None   Collection Time: 01/02/16 10:37 AM  Result Value Ref Range Status   Specimen Description BLOOD LEFT HAND  Final   Special Requests IN PEDIATRIC BOTTLE  4.0 CC  Final   Culture NO GROWTH 5 DAYS  Final   Report Status 01/07/2016 FINAL  Final  Culture, blood (routine x 2)     Status: None   Collection Time: 01/02/16 10:44 AM  Result Value Ref Range Status   Specimen Description BLOOD RIGHT HAND  Final  Special Requests IN PEDIATRIC BOTTLE  4.0 CC  Final   Culture NO GROWTH 5 DAYS  Final   Report Status 01/07/2016 FINAL  Final  Culture, respiratory (NON-Expectorated)     Status: None   Collection Time: 01/02/16 11:58 AM  Result Value Ref Range Status   Specimen Description TRACHEAL ASPIRATE  Final   Special Requests NONE  Final   Gram Stain NO WBC SEEN NO ORGANISMS SEEN   Final   Culture Consistent with normal respiratory flora.  Final   Report Status 01/04/2016 FINAL  Final     Studies: Dg Chest Port 1 View  Result Date: 01/08/2016 CLINICAL DATA:  Chest pain in the lower sternum. Shortness of breath. Cough. EXAM: PORTABLE CHEST 1 VIEW COMPARISON:  01/05/2016 and previous FINDINGS: Enlarged cardiac silhouette again noted. Central line unchanged with its tip in the SVC above the right atrium. Nasogastric tube is been removed. Effusions and lower lobe atelectasis no longer seen. Venous hypertension persists. IMPRESSION: Radiographic improvement. Resolution of effusions and lower lobe atelectasis. Venous hypertension persists. Electronically Signed   By: Nelson Chimes M.D.   On: 01/08/2016 07:54    Scheduled Meds: . famotidine (PEPCID) IV  20 mg Intravenous Q24H  . Influenza vac split quadrivalent PF  0.5 mL  Intramuscular Tomorrow-1000  . insulin aspart  0-15 Units Subcutaneous Q4H  . mouth rinse  15 mL Mouth Rinse QID  . metoprolol  10 mg Intravenous Q6H  . pneumococcal 23 valent vaccine  0.5 mL Intramuscular Tomorrow-1000  . sodium chloride flush  10-40 mL Intracatheter Q12H   Continuous Infusions: . heparin 1,050 Units/hr (01/08/16 0840)    Principal Problem:   Incarcerated hernia Active Problems:   Essential hypertension, benign   Chronic kidney disease (CKD), stage III (moderate)   Abdominal pain   Hyperglycemia   Acute on chronic renal failure (HCC)   PAD (peripheral artery disease) (HCC)   Ventral hernia   Hyperkalemia   Acute hypoxemic respiratory failure (HCC)   Acute pulmonary edema (HCC)   Pressure injury of skin    Time spent: 82mins    Domenic Polite, MD,  [facility] Pager [phone]. If 7PM-7AM, please contact night-coverage at [email or web address], password [facility] 01/08/2016, 11:10 AM  LOS: 9 days

## 2016-01-08 NOTE — Progress Notes (Signed)
[place] for Heparin Indication: ischemic left upper extremity   Assessment: 60 YOF admitted for hernia repair, now with ischemia of left distal fingertips, on IV heparin. Heparin level = 0.31 on 1000 units/hr, CBC stable  Goal of Therapy:  Heparin level 0.3-0.7 units/ml Monitor platelets by anticoagulation protocol: Yes   Plan:  Increase heparin slightly to 1050 units / hr Follow up AM labs    Allergies  Allergen Reactions  . Morphine And Related     AMS    Patient Measurements: Height: 5\' 8"  (172.7 cm) Weight: 223 lb 12.3 oz (101.5 kg) IBW/kg (Calculated) : 63.9 Heparin Dosing Weight: 86kg  Vital Signs: Temp: 98.4 F (36.9 C) (10/08 0700) Temp Source: Oral (10/08 0700) BP: 178/89 (10/08 0257) Pulse Rate: 90 (10/08 0257)  Labs:  Recent Labs  01/06/16 0247 01/07/16 0500 01/08/16 0308  HGB 8.3* 8.6* 7.9*  HCT 27.5* 27.8* 25.6*  PLT 363 395 391  HEPARINUNFRC 0.51 0.43 0.31  CREATININE 2.12* 2.20* 2.15*    Estimated Creatinine Clearance: 30.3 mL/min (by C-G formula based on SCr of 2.15 mg/dL (H)).   Maryanna Shape, PharmD, BCPS  Clinical Pharmacist  Pager: [phone]   -01/08/2016 8:33 AM

## 2016-01-08 NOTE — Progress Notes (Signed)
Pt has orders for sitter at bedside, Energy manager aware - they state they are unable to provide a sitter at this time. Bed alarm on. Call bell within reach. Bed in lowest position. SR up x3. Pt easily redirected. No attempts to pull at lines or get OOB.

## 2016-01-09 DIAGNOSIS — K460 Unspecified abdominal hernia with obstruction, without gangrene: Secondary | ICD-10-CM

## 2016-01-09 DIAGNOSIS — I998 Other disorder of circulatory system: Secondary | ICD-10-CM

## 2016-01-09 LAB — GLUCOSE, CAPILLARY
Glucose-Capillary: 104 mg/dL — ABNORMAL HIGH (ref 65–99)
Glucose-Capillary: 80 mg/dL (ref 65–99)
Glucose-Capillary: 80 mg/dL (ref 65–99)
Glucose-Capillary: 87 mg/dL (ref 65–99)
Glucose-Capillary: 89 mg/dL (ref 65–99)
Glucose-Capillary: 96 mg/dL (ref 65–99)

## 2016-01-09 LAB — HEPARIN LEVEL (UNFRACTIONATED)
Heparin Unfractionated: 0.26 IU/mL — ABNORMAL LOW (ref 0.30–0.70)
Heparin Unfractionated: 0.32 IU/mL (ref 0.30–0.70)

## 2016-01-09 LAB — CBC
HCT: 27.2 % — ABNORMAL LOW (ref 36.0–46.0)
Hemoglobin: 8.2 g/dL — ABNORMAL LOW (ref 12.0–15.0)
MCH: 28.7 pg (ref 26.0–34.0)
MCHC: 30.1 g/dL (ref 30.0–36.0)
MCV: 95.1 fL (ref 78.0–100.0)
Platelets: 405 10*3/uL — ABNORMAL HIGH (ref 150–400)
RBC: 2.86 MIL/uL — ABNORMAL LOW (ref 3.87–5.11)
RDW: 14.5 % (ref 11.5–15.5)
WBC: 12.8 10*3/uL — ABNORMAL HIGH (ref 4.0–10.5)

## 2016-01-09 MED ORDER — POLYETHYLENE GLYCOL 3350 17 G PO PACK
17.0000 g | PACK | Freq: Every day | ORAL | Status: DC
  Administered 2016-01-09 – 2016-01-10 (×2): 17 g via ORAL
  Filled 2016-01-09 (×2): qty 1

## 2016-01-09 MED ORDER — AMLODIPINE BESYLATE 10 MG PO TABS
10.0000 mg | ORAL_TABLET | Freq: Every day | ORAL | Status: DC
  Administered 2016-01-09 – 2016-01-10 (×2): 10 mg via ORAL
  Filled 2016-01-09 (×2): qty 1

## 2016-01-09 MED ORDER — ASPIRIN EC 81 MG PO TBEC
81.0000 mg | DELAYED_RELEASE_TABLET | Freq: Every day | ORAL | Status: DC
  Administered 2016-01-09 – 2016-01-10 (×2): 81 mg via ORAL
  Filled 2016-01-09 (×2): qty 1

## 2016-01-09 MED ORDER — ENOXAPARIN SODIUM 40 MG/0.4ML ~~LOC~~ SOLN
40.0000 mg | SUBCUTANEOUS | Status: DC
  Administered 2016-01-09: 40 mg via SUBCUTANEOUS
  Filled 2016-01-09: qty 0.4

## 2016-01-09 MED ORDER — DOCUSATE SODIUM 100 MG PO CAPS
100.0000 mg | ORAL_CAPSULE | Freq: Two times a day (BID) | ORAL | Status: DC
  Administered 2016-01-09 – 2016-01-10 (×3): 100 mg via ORAL
  Filled 2016-01-09 (×3): qty 1

## 2016-01-09 MED ORDER — METOPROLOL TARTRATE 50 MG PO TABS
50.0000 mg | ORAL_TABLET | Freq: Two times a day (BID) | ORAL | Status: DC
  Administered 2016-01-09 – 2016-01-10 (×3): 50 mg via ORAL
  Filled 2016-01-09 (×3): qty 1

## 2016-01-09 NOTE — Progress Notes (Signed)
Midline abdominal staples removed per order.  A small area of dehiscence noted at staple #5 of 10.  Steristrips applied along entire suture line.  Patient's daughter, Glenard Haring, at bedside and aware of interventions and upcoming transfer to [place] room 08.

## 2016-01-09 NOTE — Progress Notes (Signed)
Report called to Ginger, RN for [place] room 08.

## 2016-01-09 NOTE — Clinical Social Work Note (Signed)
CSW spoke with patient's daughters, Ms. Adah Perl and Ms. Shutter, over the phone about SNF recommendation. Both stated that their mother would not want SNF placement. She has been to CIR before but CSW explained that this recommendation was not available. Patient's daughters prefer HHPT. RNCM notified.  CSW signing off. Consult again if any social work needs arise.  Dayton Scrape, [place]

## 2016-01-09 NOTE — Progress Notes (Signed)
Patient ID: Stephanie Griffith, female   DOB: March 12, 1946, 70 y.o.   MRN: [id number]   LOS: 10 days   POD#9  Subjective: Doing well, denies abd pain. +flatus but no BM. Denies N/V. Tolerated carb mod diet last night and this am.   Objective: Vital signs in last 24 hours: Temp:  [98 F (36.7 C)-98.7 F (37.1 C)] 98.7 F (37.1 C) (10/09 1140) Pulse Rate:  [75-103] 78 (10/09 1204) Resp:  [10-22] 19 (10/09 1204) BP: (137-186)/(68-94) 172/82 (10/09 1204) SpO2:  [94 %-100 %] 100 % (10/09 1204) Weight:  [103.2 kg (227 lb 8.2 oz)] 103.2 kg (227 lb 8.2 oz) (10/09 0500) Last BM Date: 12/30/15   Laboratory  CBC  Recent Labs  01/08/16 0308 01/09/16 0451  WBC 15.1* 12.8*  HGB 7.9* 8.2*  HCT 25.6* 27.2*  PLT 391 405*    Physical Exam General appearance: alert and no distress Resp: clear to auscultation bilaterally Cardio: regular rate and rhythm GI: Soft, diminished BS   Assessment/Plan: Ventral hernia/SBO s/p SBR, partial omentectomy -- 9/30 by Dr. Grandville Silos. D/C staples. Bowel regimen. Left hand ischemia -- Vascular consulting Multiple medical problems -- per primary service    Lisette Abu, PA-C Pager: [phone] 01/09/2016

## 2016-01-09 NOTE — Progress Notes (Addendum)
  [facility] Progress Note  Subjective    Using left hand to eat. Says left hand is feeling better.  Objective Vitals:   01/09/16 1140 01/09/16 1204  BP: (!) 186/94 (!) 172/82  Pulse: 75 78  Resp: 17 19  Temp: 98.7 F (37.1 C)     Intake/Output Summary (Last 24 hours) at 01/09/16 1336 Last data filed at 01/09/16 0204  Gross per 24 hour  Intake             52.5 ml  Output              100 ml  Net            -47.5 ml   Left hand is warm. Unable to palpable radial or ulnar pulses. Dry gangrene to tip of left 2nd finger with duskiness to 3rd-5th fingers.  Motor and sensory function intact left hand.  Assessment/Planning: 70 y.o. female with ischemic left hand digit ischemia likely multifactorial from a-line, pressors and embolism 9 Days Post-Op   Left hand pain improved today. Hand is well perfused. Patient is left handed and is using left hand to eat right now.  Continue to observe.   Alvia Grove 01/09/2016 1:36 PM --  Laboratory CBC    Component Value Date/Time   WBC 12.8 (H) 01/09/2016 0451   HGB 8.2 (L) 01/09/2016 0451   HCT 27.2 (L) 01/09/2016 0451   PLT 405 (H) 01/09/2016 0451    BMET    Component Value Date/Time   NA 139 01/08/2016 0308   K 4.3 01/08/2016 0308   CL 112 (H) 01/08/2016 0308   CO2 19 (L) 01/08/2016 0308   GLUCOSE 109 (H) 01/08/2016 0308   BUN 27 (H) 01/08/2016 0308   CREATININE 2.15 (H) 01/08/2016 0308   CALCIUM 8.5 (L) 01/08/2016 0308   GFRNONAA 22 (L) 01/08/2016 0308   GFRAA 26 (L) 01/08/2016 0308    COAG Lab Results  Component Value Date   INR 1.06 12/30/2015   INR 1.13 06/04/2011   No results found for: PTT  Antibiotics Anti-infectives    Start     Dose/Rate Route Frequency Ordered Stop   01/01/16 0800  piperacillin-tazobactam (ZOSYN) IVPB 3.375 g  Status:  Discontinued     3.375 g 12.5 mL/hr over 240 Minutes Intravenous Every 8 hours 01/01/16 0746 01/07/16 [place],  PA-C [facility] Office: [phone] Pager: [phone] 01/09/2016 1:36 PM   I have independently interviewed patient and agree with PA assessment and plan above. Hand is perfused and strength improved today. Heparin discontinued and aspirin reordered. Anticipate index and pinky finger to slough but should heal.   Brandon C. Donzetta Matters, MD [facility] [place] Office: [phone] Pager: [phone]

## 2016-01-09 NOTE — Progress Notes (Signed)
Stephanie Griffith [id number] Admission Data: 01/09/2016 4:37 PM Attending Provider: Domenic Polite, MD  YTR:ZNBVAP,OLIDCV D, MD Consults/ Treatment Team: Treatment Team:  Md Edison Pace, MD Waynetta Sandy, MD  Stephanie Griffith is a 70 y.o. female patient admitted from ED awake, alert  & orientated  X 3,  Full Code, VSS - Blood pressure (!) 160/66, pulse 83, temperature 97.8 F (36.6 C), temperature source Oral, resp. rate 20, height 5\' 8"  (1.727 m), weight 103.2 kg (227 lb 8.2 oz), SpO2 100 %., no c/o shortness of breath, no c/o chest pain, no distress noted. Tele # 7 placed and pt is currently running:normal sinus rhythm.   IV site WDL:  internal jugular right, condition patent and no redness with a transparent dsg that's clean dry and intact.  Allergies:   Allergies  Allergen Reactions  . Morphine And Related     AMS     Past Medical History:  Diagnosis Date  . Chronic renal insufficiency    Cr 1.38-1.93 in 2009  . Hypercholesteremia   . Hyperglycemia   . Hypertension   . PVD (peripheral vascular disease) ([place])    s/p LAKA  . Umbilical hernia     History:  obtained from the patient. Tobacco/alcohol: denied none  Pt orientation to unit, room and routine. Information packet given to patient/family and safety video watched.  Admission INP armband ID verified with patient/family, and in place. SR up x 2, fall risk assessment complete with Patient and family verbalizing understanding of risks associated with falls. Pt verbalizes an understanding of how to use the call bell and to call for help before getting out of bed.  Skin, clean-dry- intact without evidence of bruising, or skin tears.   No evidence of skin break down noted on exam. no rashes, no ecchymoses, no petechiae    Will cont to monitor and assist as needed.  Iver Miklas, Thornell Mule, RN 01/09/2016 4:37 PM

## 2016-01-09 NOTE — Progress Notes (Signed)
[place] for Heparin Indication: ischemic left upper extremity   Assessment: 83 YOF admitted for hernia repair, now with ischemia of left distal fingertips, on IV heparin. Heparin level subtherapeutic (0.26) on gtt at 1050 units/hr. No issues with line or bleeding reported per RN. Hgb low but stable.  Goal of Therapy:  Heparin level 0.3-0.7 units/ml Monitor platelets by anticoagulation protocol: Yes   Plan:  Increase heparin to 1200 units / hr Follow up 8 hr heparin level    Allergies  Allergen Reactions  . Morphine And Related     AMS    Patient Measurements: Height: 5\' 8"  (172.7 cm) Weight: 223 lb 12.3 oz (101.5 kg) IBW/kg (Calculated) : 63.9 Heparin Dosing Weight: 86kg  Vital Signs: Temp: 98 F (36.7 C) (10/09 0300) Temp Source: Oral (10/09 0300) BP: 175/75 (10/09 0300) Pulse Rate: 83 (10/09 0300)  Labs:  Recent Labs  01/07/16 0500 01/08/16 0308 01/09/16 0451  HGB 8.6* 7.9* 8.2*  HCT 27.8* 25.6* 27.2*  PLT 395 391 405*  HEPARINUNFRC 0.43 0.31 0.26*  CREATININE 2.20* 2.15*  --     Estimated Creatinine Clearance: 30.3 mL/min (by C-G formula based on SCr of 2.15 mg/dL (H)).   Sherlon Handing, PharmD, BCPS Clinical pharmacist, pager [phone] -01/09/2016 5:43 AM

## 2016-01-09 NOTE — Progress Notes (Signed)
[facility] PROGRESS NOTE  GAGE TREIBER JJH:[id number] DOB: [birth year] DOA: 12/30/2015 PCP: Kandice Hams, MD   Brief Narrative: PCCM Tx to floor 10/40 to [place] 70 year old female with medical history significant for hypertension, CAD, PVD s/p L BKA, history of hyperglycemia but no diagnosis of diabetes, umbilical hernia, presented to emergency room for abdominal pain, N/V subsequently found to have an incarcerated ventral hernia and underwent REPAIR INCARCERATED VENTRAL HERNIA and Partial OMENTECTOMY on 12/31/2015 was transferred to ICU post op and remained on VEnt, had Afib RVR/Demand ischemia-cards was following Also noted to have ischemic changes in L hand fingers, suspected to be from A line vs AFib, VVS consulted, on heparin gtt  Assessment/Plan: 1. Incarcerated ventral hernia /sbo w/ leukocytosis, status post repair with a partial ometectomy/bowel resection on 12/31/2015 -improved -CCS following -tolerating clears, diet upgraded today -PT consult  2. Altered mentation/psychosis, agitation -suspected to be from morphine -now resolved, AAOx4 now  3. Left hand digit ischemia, suspect  multifactorial due to A-line,  Pressors,  embolic events. She also new onset atrial fib.  - She was seen by vascular surgery  - On heparin gtt per vasc surg. -pain improving -await further recommendations per VVS  4. Acute hypoxic respiratory failure secondary to unable to protect airways related to surgery with acute pulm edema/volume overload, possible COPD with a history of heavy tobacco smoking in the past.  - improved,  -Patient is auto diuresing so diuretics have been on hold and nebs as needed when necessary as well. Aggressive pulmonary toilet -CXR this am with resolution of effusions and atx -incentive spirometry, monitor  5.  New onset atrial fibrillation with RVR - Followed by cardiology. Currently off Cardizem drip -She is now sinus rhythm. -currently metoprolol 10mg  iv q6hrs,  transition to po metoprolol -no long anticoagulation recommended by cards, unless AFib recurs  6.  Hypertension, stable  7. History of demand ischemia in the ICU. -seen by cards, ECHo with normal EF and wall motion -no ischemic workup recommended at this time per Cards notes -continue Metoprolol -FU with Dr.Hochrein  8. AKI on CKD 3 - baseline cr 1.9 range, since admission consistently in 2 range -diuretics currently on hold -hold ARB  9. Anemia, chronic disease and acute blood loss -Hb in 8range, transfuse if <7  10. Hx of left AKA, wheelchair bound, but independent of ADLs prior to admission.  DVT proph: on heparin gtt  Code Status: full Family Communication: none Disposition Plan: transfer to tele  Consultants:  GS, vas surg, cardiology, pccm (signed off)  Procedures: status post repair with a partial ometectomy/bowel resection on 12/31/2015,   Antibiotics: Anti-infectives    Start     Dose/Rate Route Frequency Ordered Stop   01/01/16 0800  piperacillin-tazobactam (ZOSYN) IVPB 3.375 g  Status:  Discontinued     3.375 g 12.5 mL/hr over 240 Minutes Intravenous Every 8 hours 01/01/16 0746 01/07/16 1035      HPI/Subjective: pain in finger tips improving  Objective: Vitals:   01/09/16 1140 01/09/16 1204  BP: (!) 186/94 (!) 172/82  Pulse: 75 78  Resp: 17 19  Temp: 98.7 F (37.1 C)     Intake/Output Summary (Last 24 hours) at 01/09/16 1226 Last data filed at 01/09/16 0204  Gross per 24 hour  Intake               63 ml  Output              100 ml  Net              -  37 ml   Filed Weights   01/07/16 0500 01/08/16 0257 01/09/16 0500  Weight: 99.7 kg (219 lb 12.8 oz) 101.5 kg (223 lb 12.3 oz) 103.2 kg (227 lb 8.2 oz)   BP Readings from Last 3 Encounters:  01/09/16 (!) 172/82  07/15/14 (!) 177/77  08/13/11 (!) 129/50    Exam:   General:   pleasant, NAD, AAOx3. No distress  HEENT:   PERRL. mmm  Cardiovascular: RRR, no m/r/g. No RLE edema, sp L  AKA  Respiratory:  CTA bilaterally, no w/r/r. Normal respiratory effort.  Abdomen: obese, soft, ntnd, midline abd staples, c/d/i  Musculoskeletal:  grossly normal tone BUE/BLE Psychiatric: grossly normal mood and affect, speech fluent and appropriate  Neurologic: grossly non-focal Skin: no rash, no pallor Ext: Left BKA, LUE with ischemic changes in finger tips esp index finger   Data Reviewed: Basic Metabolic Panel:  Recent Labs Lab 01/04/16 0320 01/05/16 0429 01/06/16 0247 01/07/16 0500 01/08/16 0308  NA 139 141 144 142 139  K 5.0 4.5 4.7 4.8 4.3  CL 110 106 110 111 112*  CO2 17* 20* 20* 20* 19*  GLUCOSE 92 111* 83 98 109*  BUN 41* 43* 34* 29* 27*  CREATININE 2.65* 2.34* 2.12* 2.20* 2.15*  CALCIUM 8.1* 8.0* 8.6* 8.8* 8.5*  MG 1.9 2.4 2.2 2.1 1.9  PHOS 5.6* 3.8 3.4 2.8 3.5   Liver Function Tests: No results for input(s): AST, ALT, ALKPHOS, BILITOT, PROT, ALBUMIN in the last 168 hours. No results for input(s): LIPASE, AMYLASE in the last 168 hours. No results for input(s): AMMONIA in the last 168 hours. CBC:  Recent Labs Lab 01/05/16 0429 01/06/16 0247 01/07/16 0500 01/08/16 0308 01/09/16 0451  WBC 16.8* 14.6* 17.5* 15.1* 12.8*  NEUTROABS  --   --   --  9.8*  --   HGB 8.3* 8.3* 8.6* 7.9* 8.2*  HCT 26.5* 27.5* 27.8* 25.6* 27.2*  MCV 92.7 93.9 93.0 93.8 95.1  PLT 321 363 395 391 405*   Cardiac Enzymes: No results for input(s): CKTOTAL, CKMB, CKMBINDEX, TROPONINI in the last 168 hours. BNP (last 3 results) No results for input(s): BNP in the last 8760 hours.  ProBNP (last 3 results) No results for input(s): PROBNP in the last 8760 hours.  CBG:  Recent Labs Lab 01/08/16 1905 01/08/16 2347 01/09/16 0311 01/09/16 0757 01/09/16 1137  GLUCAP 137* 91 104* 89 96    Recent Results (from the past 240 hour(s))  MRSA PCR Screening     Status: None   Collection Time: 12/30/15  5:49 PM  Result Value Ref Range Status   MRSA by PCR NEGATIVE NEGATIVE Final     Comment:        The GeneXpert MRSA Assay (FDA approved for NASAL specimens only), is one component of a comprehensive MRSA colonization surveillance program. It is not intended to diagnose MRSA infection nor to guide or monitor treatment for MRSA infections.   Culture, blood (routine x 2)     Status: None   Collection Time: 01/02/16 10:37 AM  Result Value Ref Range Status   Specimen Description BLOOD LEFT HAND  Final   Special Requests IN PEDIATRIC BOTTLE  4.0 CC  Final   Culture NO GROWTH 5 DAYS  Final   Report Status 01/07/2016 FINAL  Final  Culture, blood (routine x 2)     Status: None   Collection Time: 01/02/16 10:44 AM  Result Value Ref Range Status   Specimen Description BLOOD RIGHT HAND  Final  Special Requests IN PEDIATRIC BOTTLE  4.0 CC  Final   Culture NO GROWTH 5 DAYS  Final   Report Status 01/07/2016 FINAL  Final  Culture, respiratory (NON-Expectorated)     Status: None   Collection Time: 01/02/16 11:58 AM  Result Value Ref Range Status   Specimen Description TRACHEAL ASPIRATE  Final   Special Requests NONE  Final   Gram Stain NO WBC SEEN NO ORGANISMS SEEN   Final   Culture Consistent with normal respiratory flora.  Final   Report Status 01/04/2016 FINAL  Final     Studies: Dg Chest Port 1 View  Result Date: 01/08/2016 CLINICAL DATA:  Chest pain in the lower sternum. Shortness of breath. Cough. EXAM: PORTABLE CHEST 1 VIEW COMPARISON:  01/05/2016 and previous FINDINGS: Enlarged cardiac silhouette again noted. Central line unchanged with its tip in the SVC above the right atrium. Nasogastric tube is been removed. Effusions and lower lobe atelectasis no longer seen. Venous hypertension persists. IMPRESSION: Radiographic improvement. Resolution of effusions and lower lobe atelectasis. Venous hypertension persists. Electronically Signed   By: Nelson Chimes M.D.   On: 01/08/2016 07:54    Scheduled Meds: . famotidine (PEPCID) IV  20 mg Intravenous Q24H  . insulin  aspart  0-15 Units Subcutaneous Q4H  . mouth rinse  15 mL Mouth Rinse QID  . metoprolol tartrate  50 mg Oral BID  . pneumococcal 23 valent vaccine  0.5 mL Intramuscular Tomorrow-1000  . sodium chloride flush  10-40 mL Intracatheter Q12H   Continuous Infusions: . heparin 1,200 Units/hr (01/09/16 0550)    Principal Problem:   Incarcerated hernia Active Problems:   Essential hypertension, benign   Chronic kidney disease (CKD), stage III (moderate)   Abdominal pain   Hyperglycemia   Acute on chronic renal failure (HCC)   PAD (peripheral artery disease) (HCC)   Ventral hernia   Hyperkalemia   Acute hypoxemic respiratory failure (HCC)   Acute pulmonary edema (HCC)   Pressure injury of skin    Time spent: 21mins    Domenic Polite, MD,  [facility] Pager [phone]. If 7PM-7AM, please contact night-coverage at [email or web address], password [facility] 01/09/2016, 12:26 PM  LOS: 10 days

## 2016-01-09 NOTE — Progress Notes (Signed)
ANTICOAGULATION CONSULT NOTE - Follow Up Consult  Pharmacy Consult for Heparin Indication: atrial fibrillation and ischemic finger  Allergies  Allergen Reactions  . Morphine And Related     AMS    Patient Measurements: Height: 5\' 8"  (172.7 cm) Weight: 227 lb 8.2 oz (103.2 kg) IBW/kg (Calculated) : 63.9 Heparin Dosing Weight: 86 kg  Vital Signs: Temp: 97.8 F (36.6 C) (10/09 1513) Temp Source: Oral (10/09 1513) BP: 160/66 (10/09 1513) Pulse Rate: 83 (10/09 1513)  Labs:  Recent Labs  01/07/16 0500 01/08/16 0308 01/09/16 0451 01/09/16 1400  HGB 8.6* 7.9* 8.2*  --   HCT 27.8* 25.6* 27.2*  --   PLT 395 391 405*  --   HEPARINUNFRC 0.43 0.31 0.26* 0.32  CREATININE 2.20* 2.15*  --   --     Estimated Creatinine Clearance: 30.6 mL/min (by C-G formula based on SCr of 2.15 mg/dL (H)).   Medications:  Scheduled:  . amLODipine  10 mg Oral Daily  . docusate sodium  100 mg Oral BID  . famotidine (PEPCID) IV  20 mg Intravenous Q24H  . insulin aspart  0-15 Units Subcutaneous Q4H  . mouth rinse  15 mL Mouth Rinse QID  . metoprolol tartrate  50 mg Oral BID  . pneumococcal 23 valent vaccine  0.5 mL Intramuscular Tomorrow-1000  . polyethylene glycol  17 g Oral Daily  . sodium chloride flush  10-40 mL Intracatheter Q12H   Infusions:  . heparin 1,200 Units/hr (01/09/16 0550)    Assessment: 70 yo F continues on heparin infusion for ischemic finger / PAF.  Per VVS, ok to stop heparin and restart PTA ASA only.  Also seen by cards who suggested no long term anticoagulation unless patient goes back in afib.  Pt currently therapeutic on 1200 units/hr.  Goal of Therapy:  Heparin level 0.3-0.7 units/ml  Monitor platelets by anticoagulation protocol: Yes   Plan:  Continue heparin at 1200 units/hr Continue daily heparin level and CBC Follow-up needs to continue anticoagulation - MD sticky note in place.  [employer], Pharm.D., BCPS Clinical Pharmacist Pager  [phone] 01/09/2016 3:31 PM

## 2016-01-10 LAB — BASIC METABOLIC PANEL
Anion gap: 9 (ref 5–15)
BUN: 20 mg/dL (ref 6–20)
CO2: 19 mmol/L — ABNORMAL LOW (ref 22–32)
Calcium: 8.4 mg/dL — ABNORMAL LOW (ref 8.9–10.3)
Chloride: 109 mmol/L (ref 101–111)
Creatinine, Ser: 1.87 mg/dL — ABNORMAL HIGH (ref 0.44–1.00)
GFR calc Af Amer: 30 mL/min — ABNORMAL LOW (ref >60)
GFR calc non Af Amer: 26 mL/min — ABNORMAL LOW (ref >60)
Glucose, Bld: 88 mg/dL (ref 65–99)
Potassium: 4.2 mmol/L (ref 3.5–5.1)
Sodium: 137 mmol/L (ref 135–145)

## 2016-01-10 LAB — CBC
HCT: 27 % — ABNORMAL LOW (ref 36.0–46.0)
Hemoglobin: 8 g/dL — ABNORMAL LOW (ref 12.0–15.0)
MCH: 28.2 pg (ref 26.0–34.0)
MCHC: 29.6 g/dL — ABNORMAL LOW (ref 30.0–36.0)
MCV: 95.1 fL (ref 78.0–100.0)
Platelets: 409 10*3/uL — ABNORMAL HIGH (ref 150–400)
RBC: 2.84 MIL/uL — ABNORMAL LOW (ref 3.87–5.11)
RDW: 15.1 % (ref 11.5–15.5)
WBC: 11.7 10*3/uL — ABNORMAL HIGH (ref 4.0–10.5)

## 2016-01-10 LAB — GLUCOSE, CAPILLARY
Glucose-Capillary: 97 mg/dL (ref 65–99)
Glucose-Capillary: 95 mg/dL (ref 65–99)
Glucose-Capillary: 85 mg/dL (ref 65–99)

## 2016-01-10 MED ORDER — POLYETHYLENE GLYCOL 3350 17 G PO PACK: 17.0000 g | PACK | Freq: Every day | ORAL | 0 refills | Status: DC | PRN

## 2016-01-10 MED ORDER — FLEET ENEMA 7-19 GM/118ML RE ENEM
1.0000 | ENEMA | Freq: Once | RECTAL | Status: AC
  Administered 2016-01-10: 1 via RECTAL
  Filled 2016-01-10: qty 1

## 2016-01-10 MED ORDER — ENOXAPARIN SODIUM 60 MG/0.6ML ~~LOC~~ SOLN: 0.5000 mg/kg | SUBCUTANEOUS | Status: DC

## 2016-01-10 MED ORDER — METOPROLOL TARTRATE 50 MG PO TABS
50.0000 mg | ORAL_TABLET | Freq: Two times a day (BID) | ORAL | 0 refills | Status: DC
Start: 2016-01-10 — End: 2020-04-04

## 2016-01-10 MED ORDER — TRAMADOL HCL 50 MG PO TABS
50.0000 mg | ORAL_TABLET | Freq: Four times a day (QID) | ORAL | 0 refills | Status: DC | PRN
Start: 2016-01-10 — End: 2017-07-04

## 2016-01-10 MED ORDER — POLYETHYLENE GLYCOL 3350 17 G PO PACK: 17.0000 g | PACK | Freq: Two times a day (BID) | ORAL | Status: DC

## 2016-01-10 NOTE — Care Management Note (Signed)
Case Management Note  Patient Details  Name: Stephanie Griffith MRN: [id number] Date of Birth: 10-24-1945  Subjective/Objective:                 Patient from home with daughter and grandson. She declined SNF. She will DC to home with [facility] RN PT OT HHA through [facility], referral made, awaiting orders from MD. Patient states she has prosthetic, WC, Walker, at home and declines additional DME. Patient provided with application for PCS through medicaid.   Action/Plan:  Will DC to home today with [facility] services.   Expected Discharge Date:                  Expected Discharge Plan:  [place]  In-House Referral:  Clinical Social Work  Discharge planning Services  CM Consult  Post Acute Care Choice:  NA Choice offered to:  Patient  DME Arranged:  N/A DME Agency:  NA  HH Arranged:  RN, PT, OT, Nurse's Aide [place] Agency:  [place]  Status of Service:  Completed, signed off  If discussed at [place] of Stay Meetings, dates discussed:    Additional Comments:  [name], RN 01/10/2016, 11:39 AM

## 2016-01-10 NOTE — Progress Notes (Signed)
Patient refusing SNF. Patient granted permission to call daughter, left VM. Will plan HH with patient.

## 2016-01-10 NOTE — Discharge Instructions (Signed)
[facility], [place] [phone]  OPEN ABDOMINAL SURGERY: POST OP INSTRUCTIONS  Always review your discharge instruction sheet given to you by the facility where your surgery was performed.  IF YOU HAVE DISABILITY OR FAMILY LEAVE FORMS, YOU MUST BRING THEM TO THE OFFICE FOR PROCESSING.  PLEASE DO NOT GIVE THEM TO YOUR DOCTOR.  1. A prescription for pain medication may be given to you upon discharge.  Take your pain medication as prescribed, if needed.  If narcotic pain medicine is not needed, then you may take acetaminophen (Tylenol) or ibuprofen (Advil) as needed. 2. Take your usually prescribed medications unless otherwise directed. 3. If you need a refill on your pain medication, please contact your pharmacy. They will contact our office to request authorization.  Prescriptions will not be filled after 5pm or on week-ends. 4. You should follow a light diet the first few days after arrival home, avoid fried/fatty greasy foods, etc.unless your doctor has advised otherwise. A high-fiber, low fat diet can be resumed as tolerated.   Be sure to include lots of fluids daily. Most patients will experience some swelling and bruising on the chest and neck area.  Ice packs will help.  Swelling and bruising can take several days to resolve 5. Most patients will experience some swelling and bruising in the area of the incision. Ice pack will help. Swelling and bruising can take several days to resolve..  6. It is common to experience some constipation if taking pain medication after surgery.  Increasing fluid intake and taking a stool softener will usually help or prevent this problem from occurring.  A mild laxative (Milk of Magnesia or Miralax) should be taken according to package directions if there are no bowel movements after 48 hours. 7.  You may have steri-strips (small skin tapes) in place directly over the incision.  These strips should be left on the skin for 7-10 days.  You may  shower and replace the bandage daily. 8. ACTIVITIES:  You may resume regular (light) daily activities beginning the next day--such as daily self-care, walking, climbing stairs--gradually increasing activities as tolerated.  You may have sexual intercourse when it is comfortable.  Refrain from any heavy lifting or straining until approved by your doctor. a. You may drive when you no longer are taking prescription pain medication, you can comfortably wear a seatbelt, and you can safely maneuver your car and apply brakes b. Return to Work: ___________________________________ 28. You should see your doctor in the office for a follow-up appointment approximately two weeks after your surgery.  Make sure that you call for this appointment within a day or two after you arrive home to insure a convenient appointment time. OTHER INSTRUCTIONS:  _____________________________________________________________ _____________________________________________________________  WHEN TO CALL YOUR DOCTOR: 1. Fever over 101.0 2. Inability to urinate 3. Nausea and/or vomiting 4. Extreme swelling or bruising 5. Continued bleeding from incision. 6. Increased pain, redness, or drainage from the incision. 7. Difficulty swallowing or breathing   The clinic staff is available to answer your questions during regular business hours.  Please dont hesitate to call and ask to speak to one of the nurses if you have concerns.  For further questions, please visit [email or web address]

## 2016-01-10 NOTE — Care Management Important Message (Signed)
Important Message  Patient Details  Name: Stephanie Griffith MRN: [id number] Date of Birth: Sep 03, 1945   Medicare Important Message Given:  Yes    Jonte Wollam Abena 01/10/2016, 9:08 AM

## 2016-01-10 NOTE — Progress Notes (Signed)
Physical Therapy Treatment Patient Details Name: Stephanie Griffith MRN: [id number] DOB: [birth year] Today's Date: 01/10/2016    History of Present Illness 70 yo admitted with N/V now s/p ventral hernia repair, LUE ischemia due to radial artery occulsion after arterial line pulled, confusion post operatively. PMHx: Lt AKA, HTN, CKD, PVD    PT Comments    Patient continues to require +2 assist for transfers and demonstrated decreased awareness of deficits/safety. If pt continues to refuse SNF, pt will need HHPT and 24 hour supervision/assistance. Pt's daughter presnt for session. Continue to progress as tolerated.   Follow Up Recommendations  SNF;Supervision/Assistance - 24 hour     Equipment Recommendations  None recommended by PT    Recommendations for Other Services OT consult     Precautions / Restrictions Precautions Precautions: Fall Precaution Comments: Lt AKA    Mobility  Bed Mobility Overal bed mobility: Needs Assistance Bed Mobility: Rolling;Sidelying to Sit Rolling: Min assist Sidelying to sit: Mod assist       General bed mobility comments: cues for sequencing/technique; use of rail and assist to roll into sidelying and elevate trunk into sitting  Transfers Overall transfer level: Needs assistance   Transfers: Squat Pivot Transfers     Squat pivot transfers: Mod assist;+2 physical assistance     General transfer comment: multimodal cues for sequencing and hand placement; assist to block R knee and elevate hips from EOB and guide to recliner with +2 assist; X3 attempts of squat pivot to w/c with max cues for w/c set up and sequencing/hand placement; pt became fatigued and unable to complete transfer  Ambulation/Gait                 Stairs            Wheelchair Mobility    Modified Rankin (Stroke Patients Only)       Balance Overall balance assessment: Needs assistance   Sitting balance-Leahy Scale: Fair                               Cognition Arousal/Alertness: Awake/alert Behavior During Therapy: Flat affect Overall Cognitive Status: Impaired/Different from baseline Area of Impairment: Attention;Memory;Following commands;Safety/judgement;Awareness;Problem solving   Current Attention Level: Sustained Memory: Decreased short-term memory Following Commands: Follows one step commands with increased time;Follows one step commands inconsistently Safety/Judgement: Decreased awareness of safety;Decreased awareness of deficits Awareness: Intellectual Problem Solving: Difficulty sequencing;Requires verbal cues General Comments: pt did not want assistance from therapist and asked therapist to not touch her when attempting to transfer even with unsuccessful attempt; pt reported that when she leaves today she is going to get her hair done and go shopping and that she uses her prosthesis and RW when going shopping; daughter present and reported pt has not use prosthesis in "a long time" and it more than likely does not fit anymore; pt unaware of need for assistance to transfer to w/c and needed X3 attempts to understand level of deficits    Exercises      General Comments General comments (skin integrity, edema, etc.): daughter present for last half of session; pt continues to refuse SNF      Pertinent Vitals/Pain Pain Assessment: No/denies pain    Home Living                      Prior Function            PT Goals (current goals can  now be found in the care plan section) Acute Rehab PT Goals Patient Stated Goal: return home Progress towards PT goals: Progressing toward goals    Frequency    Min 3X/week      PT Plan Current plan remains appropriate    Co-evaluation             End of Session Equipment Utilized During Treatment: Gait belt Activity Tolerance: Patient limited by fatigue Patient left: in chair;with call bell/phone within reach;with chair alarm set     Time:  6825-7493 PT Time Calculation (min) (ACUTE ONLY): 39 min  Charges:  $Therapeutic Activity: 38-52 mins                    G Codes:      Salina April, PTA Pager: [phone]   01/10/2016, 12:18 PM

## 2016-01-10 NOTE — Progress Notes (Signed)
Nsg Discharge Note  Admit Date:  12/30/2015 Discharge date: 01/10/2016   SANDY HAYE to be D/C'd Home per MD order.  AVS completed.  Copy for chart, and copy for patient signed, and dated. Patient/caregiver able to verbalize understanding.  Discharge Medication:   Medication List    STOP taking these medications   carvedilol 25 MG tablet Commonly known as:  COREG   olmesartan-hydrochlorothiazide 40-25 MG tablet Commonly known as:  BENICAR HCT     TAKE these medications   amLODipine 5 MG tablet Commonly known as:  NORVASC Take 10 mg by mouth daily.   aspirin 81 MG EC tablet Take 81 mg by mouth daily.   atorvastatin 40 MG tablet Commonly known as:  LIPITOR Take 40 mg by mouth daily.   famotidine 20 MG tablet Commonly known as:  PEPCID Take 20 mg by mouth 2 (two) times daily.   gabapentin 100 MG capsule Commonly known as:  NEURONTIN Take 100 mg by mouth 2 (two) times daily.   metoprolol 50 MG tablet Commonly known as:  LOPRESSOR Take 1 tablet (50 mg total) by mouth 2 (two) times daily.   polyethylene glycol packet Commonly known as:  MIRALAX / GLYCOLAX Take 17 g by mouth daily as needed for mild constipation.   traMADol 50 MG tablet Commonly known as:  ULTRAM Take 1 tablet (50 mg total) by mouth every 6 (six) hours as needed for moderate pain.       Discharge Assessment: Vitals:   01/10/16 0530 01/10/16 0935  BP: (!) 151/69 (!) 176/69  Pulse: 77 93  Resp: 18   Temp: 98.3 F (36.8 C)    Patient's 2nd-5th digits on L hand are dusky. Old scar in buttocks. Otherwise intact. . IV catheter discontinued intact. Site without signs and symptoms of complications - no redness or edema noted at insertion site, patient denies c/o pain - only slight tenderness at site.  Dressing with slight pressure applied.  D/c Instructions-Education: Discharge instructions given to patient/family with verbalized understanding. D/c education completed with patient/family  including follow up instructions, medication list, d/c activities limitations if indicated, with other d/c instructions as indicated by MD - patient able to verbalize understanding, all questions fully answered. Patient instructed to return to ED, call 911, or call MD for any changes in condition.  Patient escorted via [place], and D/C home via private auto.  Nashira Mcglynn Margaretha Sheffield, RN 01/10/2016 1:45 PM

## 2016-01-10 NOTE — Progress Notes (Signed)
10 Days Post-Op  Subjective: No n/v. Reports small BM. No abd pain  Objective: Vital signs in last 24 hours: Temp:  [97.4 F (36.3 C)-98.7 F (37.1 C)] 98.3 F (36.8 C) (10/10 0530) Pulse Rate:  [75-96] 77 (10/10 0530) Resp:  [17-22] 18 (10/10 0530) BP: (150-186)/(63-94) 151/69 (10/10 0530) SpO2:  [98 %-100 %] 98 % (10/10 0530) Weight:  [101.6 kg (223 lb 15.8 oz)] 101.6 kg (223 lb 15.8 oz) (10/10 0530) Last BM Date: 01/29/16  Intake/Output from previous day: 10/09 0701 - 10/10 0700 In: 860 [P.O.:840; I.V.:20] Out: 250 [Urine:250] Intake/Output this shift: No intake/output data recorded.  Alert, approp Obese, soft, nt, incision c/d/i; +BS  Lab Results:   Recent Labs  01/09/16 0451 01/10/16 0550  WBC 12.8* 11.7*  HGB 8.2* 8.0*  HCT 27.2* 27.0*  PLT 405* 409*   BMET  Recent Labs  01/08/16 0308 01/10/16 0550  NA 139 137  K 4.3 4.2  CL 112* 109  CO2 19* 19*  GLUCOSE 109* 88  BUN 27* 20  CREATININE 2.15* 1.87*  CALCIUM 8.5* 8.4*   PT/INR No results for input(s): LABPROT, INR in the last 72 hours. ABG No results for input(s): PHART, HCO3 in the last 72 hours.  Invalid input(s): PCO2, PO2  Studies/Results: No results found.  Anti-infectives: Anti-infectives    Start     Dose/Rate Route Frequency Ordered Stop   01/01/16 0800  piperacillin-tazobactam (ZOSYN) IVPB 3.375 g  Status:  Discontinued     3.375 g 12.5 mL/hr over 240 Minutes Intravenous Every 8 hours 01/01/16 0746 01/07/16 1035      Assessment/Plan: Ventral hernia/SBO s/p SBR, partial omentectomy -- 9/30 by Dr. Grandville Silos. D/C staples. Bowel regimen. Left hand ischemia  - Improved.   Doing well. Tolerating diet.  Will try enema this am.  If does well with enema can be discharged later today Follow up with Dr Benancio Deeds. Redmond Pulling, MD, FACS General, Bariatric, & Minimally Invasive Surgery [facility], [place]     LOS: 11 days    Stephanie Griffith 01/10/2016

## 2016-01-10 NOTE — Progress Notes (Signed)
   Hand is much improved from last week Will not likely benefit from vascular intervention Continues asa and statin therapy.  Can f/u as outpatient as needed.   Manraj Yeo C. Donzetta Matters, MD [facility] Office: [phone] Pager: [phone]

## 2016-01-22 NOTE — Discharge Summary (Signed)
Physician Discharge Summary  Stephanie Griffith QMV:[id number] DOB: 1946/03/27 DOA: 12/30/2015  PCP: Kandice Hams, MD  Admit date: 12/30/2015 Discharge date: 01/10/2016  Time spent: 45 minutes  Recommendations for Outpatient Follow-up:  1. General Surgery Dr.Thompson in 2weeks 2. Vascular surgery Dr.Cain in 61month   Discharge Diagnoses:  Principal Problem:   Incarcerated hernia Active Problems:   Essential hypertension, benign   Chronic kidney disease (CKD), stage III (moderate)   Abdominal pain   Hyperglycemia   Acute on chronic renal failure (HCC)   PAD (peripheral artery disease) (HCC)   Ventral hernia   Hyperkalemia   Acute hypoxemic respiratory failure (HCC)   Acute pulmonary edema (HCC)   Pressure injury of skin   Discharge Condition: stable  Diet recommendation: low sodium  Filed Weights   01/08/16 0257 01/09/16 0500 01/10/16 0530  Weight: 101.5 kg (223 lb 12.3 oz) 103.2 kg (227 lb 8.2 oz) 101.6 kg (223 lb 15.8 oz)    History of present illness:  70 year old female with medical history significant for hypertension, CAD, PVD s/p L BKA, history of hyperglycemia but no diagnosis of diabetes, umbilical hernia, presented to emergency room for abdominal pain, N/V subsequently found to have an incarcerated ventral hernia   Hospital Course:  70 year old female with medical history significant for hypertension, CAD, PVD s/p L BKA, history of hyperglycemia but no diagnosis of diabetes, umbilical hernia, presented to emergency room for abdominal pain, N/V subsequently found to have an incarcerated ventral hernia and underwent REPAIR INCARCERATED VENTRAL HERNIA and Partial OMENTECTOMY on 12/31/2015 was transferred to ICU post op and remained on VEnt, post operative course was complicated with transient Afib RVR/Demand ischemia and ischemic changes in L hand fingers, suspected to be from A line vs AFib.  Detailed course 1. Incarcerated ventral hernia /sbo w/ leukocytosis, status  post repair with a partial ometectomy/bowel resection on 12/31/2015 -improved, followed by surgery throughout, staples removed and tolerating soft diet at the time of discharge -FU with Dr.Thompson in 2-3weeks  2. Altered mentation/psychosis, agitation -suspected to be from morphine -now resolved, AAOx4 now  3. Left hand digit ischemia, suspect  multifactorial due to A-line,  Pressors,  embolic events. She also new onset atrial fib.  - She was seen by vascular surgery, was briefly put on heparin gtt per vascular surgery. -pain improving -Today Dr.Cain felt that her hand is much improved from last week and will not likely benefit from vascular intervention, he recommended to continue asa and statin therapy.   4. Acute hypoxic respiratory failure secondary to unable to protect airways related to surgery with acute pulm edema/volume overload, possible COPD with a history of heavy tobacco smoking in the past.  - improved, with diuretics and pulm toilet -repeat CXR with resolution of effusions and atx -weaned off O2  5.  New onset Transient atrial fibrillation with RVR - Followed by cardiology. Currently off Cardizem drip -She is now sinus rhythm. -was on metoprolol 10mg  iv q6hrs, transitioned to po metoprolol -no long term anticoagulation recommended by cards, unless AFib recurs  6.  Hypertension, stable  7. History of demand ischemia in the ICU. -seen by cards, ECHo with normal EF and wall motion -no ischemic workup recommended at this time per Cards notes -continue Metoprolol -FU with Dr.Hochrein  8. AKI on CKD 3 - baseline cr 1.9 range, since admission consistently in 2 range -diuretics currently on hold -hold ARB  9. Anemia, chronic disease and acute blood loss -Hb in 8range, transfuse if <7  10.  Hx of left AKA, wheelchair bound, but independent of ADLs prior to admission  Procedures: Ventral hernia repair with a partial ometectomy/bowel resection on 12/31/2015,    Consultations:  VVS  Surgery  PCCM  Discharge Exam: Vitals:   01/10/16 0530 01/10/16 0935  BP: (!) 151/69 (!) 176/69  Pulse: 77 93  Resp: 18   Temp: 98.3 F (36.8 C)     General: AAOx3 Cardiovascular: S1S2/RRR Respiratory: CTAB  Discharge Instructions    Discharge Medication List as of 01/10/2016  1:41 PM    START taking these medications   Details  metoprolol (LOPRESSOR) 50 MG tablet Take 1 tablet (50 mg total) by mouth 2 (two) times daily., Starting Tue 01/10/2016, Print    polyethylene glycol (MIRALAX / GLYCOLAX) packet Take 17 g by mouth daily as needed for mild constipation., Starting Tue 01/10/2016, Print    traMADol (ULTRAM) 50 MG tablet Take 1 tablet (50 mg total) by mouth every 6 (six) hours as needed for moderate pain., Starting Tue 01/10/2016, Print      CONTINUE these medications which have NOT CHANGED   Details  amLODipine (NORVASC) 5 MG tablet Take 10 mg by mouth daily. , Starting Thu 07/08/2014, Historical Med    aspirin 81 MG EC tablet Take 81 mg by mouth daily.  , Historical Med    atorvastatin (LIPITOR) 40 MG tablet Take 40 mg by mouth daily., Historical Med    famotidine (PEPCID) 20 MG tablet Take 20 mg by mouth 2 (two) times daily., Historical Med    gabapentin (NEURONTIN) 100 MG capsule Take 100 mg by mouth 2 (two) times daily. , Starting Wed 06/23/2014, Historical Med      STOP taking these medications     carvedilol (COREG) 25 MG tablet      olmesartan-hydrochlorothiazide (BENICAR HCT) 40-25 MG per tablet        Allergies  Allergen Reactions  . Morphine And Related     AMS   Follow-up Information    THOMPSON,BURKE E, MD. Schedule an appointment as soon as possible for a visit today.   Specialty:  General Surgery Why:  in 2-4 weeks for follow up after bowel/hernia surgery Contact information: [place] [address] [phone]            The results of significant diagnostics from this  hospitalization (including imaging, microbiology, ancillary and laboratory) are listed below for reference.    Significant Diagnostic Studies: Ct Abdomen Pelvis Wo Contrast  Result Date: 12/30/2015 CLINICAL DATA:  70 year old female with abdominal pain. Ventral hernia. EXAM: CT ABDOMEN AND PELVIS WITHOUT CONTRAST TECHNIQUE: Multidetector CT imaging of the abdomen and pelvis was performed following the standard protocol without IV contrast. COMPARISON:  Abdominal CT dated 05/31/2011 and ultrasound dated 06/02/2011 FINDINGS: Lower chest: The visualized lung bases are clear. There is coronary vascular calcification. Partially visualized pericardial effusion. No intra-abdominal free air.  Small free fluid within the pelvis. Hepatobiliary: There multiple stones within the gallbladder. No pericholecystic fluid. The liver is unremarkable. No intrahepatic biliary ductal dilatation Pancreas: Unremarkable. No pancreatic ductal dilatation or surrounding inflammatory changes. Spleen: Normal in size without focal abnormality. Adrenals/Urinary Tract: Bilateral adrenal thickening/ hyperplasia versus adenoma similar to prior study. The kidneys are mildly atrophic. There is no hydronephrosis or nephrolithiasis on either side. The visualized ureters and urinary bladder appear unremarkable. Stomach/Bowel: There is a small hiatal hernia with gastroesophageal reflux. There is a 5.2 cm supraumbilical ventral defect with herniation of portion of the transverse  colon and multiple loops of small bowel. There is focal compression and narrowing of a loop of bowel as it exits the hernia (series 201, image 36) with dilatation of small bowel loop proximal to the exit point within the hernia sac as well as loops of small bowel proximal to the hernia. Small amount of fluid is noted within the hernia sac. There is extensive colonic diverticulosis without active inflammatory changes. Normal appendix. Vascular/Lymphatic: Advanced aortoiliac  atherosclerotic disease. Evaluation of the vasculature is limited in the absence of contrast. No portal venous gas identified. There is no adenopathy. Reproductive: The uterus and the ovaries are grossly unremarkable. Other: None Musculoskeletal: There is multilevel degenerative changes of the spine with facet hypertrophy and osteophyte formation. No acute fracture. IMPRESSION: Large supraumbilical ventral hernia containing a segment of the transverse colon and small bowel. There is compression of the small bowel at the neck of the hernia with small bowel obstruction. Clinical correlation and surgical consult is advised. No free air. Colonic diverticulosis. Cholelithiasis. Electronically Signed   By: Anner Crete M.D.   On: 12/30/2015 03:43   Ct Head Wo Contrast  Result Date: 01/02/2016 CLINICAL DATA:  Altered mental status EXAM: CT HEAD WITHOUT CONTRAST TECHNIQUE: Contiguous axial images were obtained from the base of the skull through the vertex without intravenous contrast. COMPARISON:  None. FINDINGS: Brain: The ventricles are normal in size and configuration. There is mild invagination of CSF into the sella. There is no intracranial mass, hemorrhage, extra-axial fluid collection, or midline shift. 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 Elsewhere there is small vessel disease in the centra semiovale bilaterally. Small vessel disease is also noted in each thalamus. No acute appearing infarct is evident. Basal ganglia calcification is likely physiologic in this age group. Vascular: There is no hyperdense vessel evident. There is calcification in each carotid siphon region. Skull: The bony calvarium appears intact. Sinuses/Orbits: There is a small retention cyst in the anterior left maxillary antrum. There is mucosal thickening in several ethmoid air cells on the  left, mild. Visualized paranasal sinuses elsewhere clear. Orbits appear symmetric bilaterally. Other: Mastoid air cells are clear. 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 evident. No intracranial mass, hemorrhage, or evidence of acute infarct. Mild paranasal sinus disease. Foci of arterial vascular calcification evident. Mild invagination of CSF into the sella is noted, a finding of questionable significance. Electronically Signed   By: Lowella Grip III M.D.   On: 01/02/2016 14:36   Dg Chest Port 1 View  Result Date: 01/08/2016 CLINICAL DATA:  Chest pain in the lower sternum. Shortness of breath. Cough. EXAM: PORTABLE CHEST 1 VIEW COMPARISON:  01/05/2016 and previous FINDINGS: Enlarged cardiac silhouette again noted. Central line unchanged with its tip in the SVC above the right atrium. Nasogastric tube is been removed. Effusions and lower lobe atelectasis no longer seen. Venous hypertension persists. IMPRESSION: Radiographic improvement. Resolution of effusions and lower lobe atelectasis. Venous hypertension persists. Electronically Signed   By: Nelson Chimes M.D.   On: 01/08/2016 07:54   Dg Chest Port 1 View  Result Date: 01/05/2016 CLINICAL DATA:  Respiratory failure, interval extubation since the earlier study. EXAM: PORTABLE CHEST 1 VIEW COMPARISON:  Portable chest x-ray of January 03, 2016 FINDINGS: The lungs remain mildly hypoinflated. Bibasilar atelectasis or pneumonia with small bilateral  pleural effusions remain. The cardiac silhouette remains enlarged. The central pulmonary vascularity is mildly engorged. The interstitial markings are coarse. The esophagogastric tube tip projects below the inferior margin of the image. The right internal jugular venous catheter tip projects over the junction of the middle and distal thirds of the SVC. IMPRESSION: CHF with mild pulmonary vascular congestion and  interstitial edema. Interval extubation with persistent atelectasis or pneumonia with small bilateral pleural effusions. There has not been significant interval change since the pre extubation study of 03 January 2016. Electronically Signed   By: David  Martinique M.D.   On: 01/05/2016 07:51   Dg Chest Port 1 View  Result Date: 01/03/2016 CLINICAL DATA:  Respiratory failure EXAM: PORTABLE CHEST 1 VIEW COMPARISON:  01/02/2016 FINDINGS: Right jugular central line, endotracheal tube and nasogastric catheter are again seen and stable. Cardiac shadow is mildly enlarged but stable. Small bilateral pleural effusions are again seen. Likely basilar atelectasis is present as well. The degree of vascular congestion has improved slightly in the interval from the prior exam. No bony abnormality is noted. IMPRESSION: Bibasilar atelectatic changes and effusions. Electronically Signed   By: Inez Catalina M.D.   On: 01/03/2016 07:39   Dg Chest Port 1 View  Result Date: 01/02/2016 CLINICAL DATA:  Hypoxia. EXAM: PORTABLE CHEST 1 VIEW COMPARISON:  01/01/2016. FINDINGS: Endotracheal tube, NG tube, right IJ line stable position. Cardiomegaly with bilateral pulmonary interstitial prominence and bilateral pleural effusions noted consistent with congestive heart failure. Interim worsening from prior exam. No pneumothorax . IMPRESSION: 1.  Lines and tubes in stable position. 2. Severe cardiomegaly with bilateral pulmonary interstitial prominence and bilateral pleural effusions. Findings consistent with congestive heart failure. Interim worsening from prior exam. Electronically Signed   By: Marcello Moores  Register   On: 01/02/2016 07:25   Portable Chest Xray  Result Date: 01/01/2016 CLINICAL DATA:  70 y/o F; endotracheal tube. Acute respiratory failure. EXAM: PORTABLE CHEST 1 VIEW COMPARISON:  12/31/2015 chest radiograph. FINDINGS: Endotracheal tube is slightly distracted now 5 cm from carina, possibly exaggerated by project. Right central  venous catheter tip projects over mid SVC. Stable cardiomegaly. Increased attenuation of left lung and blunted left costophrenic angle probably represents a layering effusion. Interstitial pulmonary edema. Improved aeration of right upper lobe. No new focal consolidation. IMPRESSION: Stable left pleural effusion and interstitial pulmonary edema. Improved aeration of right upper lobe. Electronically Signed   By: Kristine Garbe M.D.   On: 01/01/2016 05:52   Portable Chest Xray  Result Date: 12/31/2015 CLINICAL DATA:  Acute respiratory failure. EXAM: PORTABLE CHEST 1 VIEW COMPARISON:  Radiograph of December 30, 2015. FINDINGS: Stable cardiomegaly. No pneumothorax is noted. Interval placement of nasogastric tube seen entering stomach. Endotracheal tube is noted with distal tip 4 cm above the carina. Interval placement of right internal jugular catheter with distal tip in expected position of SVC. Mild bilateral perihilar and basilar interstitial densities are noted concerning for pulmonary edema. Probable mild left pleural effusion is noted. Right upper lobe opacity is noted concerning for possible pneumonia. Bony thorax is unremarkable. IMPRESSION: Endotracheal and nasogastric tubes in grossly good position. Right internal jugular catheter is noted with tip in expected position of SVC. Probable bilateral perihilar and basilar pulmonary edema. Probable right upper lobe pneumonia or possibly atelectasis. Electronically Signed   By: Marijo Conception, M.D.   On: 12/31/2015 14:05   Dg Chest Port 1 View  Result Date: 12/30/2015 CLINICAL DATA:  Shortness of breath.  Abdominal pain. EXAM: PORTABLE CHEST 1  VIEW COMPARISON:  One-view chest x-ray 06/07/2011 FINDINGS: The heart is mildly enlarged. Mild edema is present. Small effusions are suspected. Bibasilar airspace disease likely reflects atelectasis. Lung apices are clear. The visualized soft tissues and bony thorax are unremarkable. IMPRESSION: 1.  Cardiomegaly with mild edema and small effusions suggesting congestive heart failure. 2. Mild the bibasilar airspace disease likely reflects atelectasis. Electronically Signed   By: San Morelle M.D.   On: 12/30/2015 08:45    Microbiology: No results found for this or any previous visit (from the past 240 hour(s)).   Labs: Basic Metabolic Panel: No results for input(s): NA, K, CL, CO2, GLUCOSE, BUN, CREATININE, CALCIUM, MG, PHOS in the last 168 hours. Liver Function Tests: No results for input(s): AST, ALT, ALKPHOS, BILITOT, PROT, ALBUMIN in the last 168 hours. No results for input(s): LIPASE, AMYLASE in the last 168 hours. No results for input(s): AMMONIA in the last 168 hours. CBC: No results for input(s): WBC, NEUTROABS, HGB, HCT, MCV, PLT in the last 168 hours. Cardiac Enzymes: No results for input(s): CKTOTAL, CKMB, CKMBINDEX, TROPONINI in the last 168 hours. BNP: BNP (last 3 results) No results for input(s): BNP in the last 8760 hours.  ProBNP (last 3 results) No results for input(s): PROBNP in the last 8760 hours.  CBG: No results for input(s): GLUCAP in the last 168 hours.     SignedDomenic Polite MD.  [facility] 01/22/2016, 12:09 PM

## 2016-02-01 ENCOUNTER — Encounter: Payer: Self-pay | Admitting: Vascular Surgery

## 2016-02-03 ENCOUNTER — Encounter: Payer: Medicare Other | Admitting: Vascular Surgery

## 2016-02-07 ENCOUNTER — Encounter: Payer: Self-pay | Admitting: Vascular Surgery

## 2016-02-08 ENCOUNTER — Ambulatory Visit (INDEPENDENT_AMBULATORY_CARE_PROVIDER_SITE_OTHER): Payer: Medicare Other | Admitting: Vascular Surgery

## 2016-02-08 ENCOUNTER — Encounter: Payer: Self-pay | Admitting: Vascular Surgery

## 2016-02-08 VITALS — BP 120/61 | HR 77 | Temp 97.3°F | Resp 18 | Ht 68.0 in | Wt 223.0 lb

## 2016-02-08 DIAGNOSIS — I739 Peripheral vascular disease, unspecified: Secondary | ICD-10-CM

## 2016-02-08 NOTE — Progress Notes (Signed)
Patient is a 70 year old female who returns for follow-up today after recent hospital consultation for ischemia of multiple digits in the left hand. At that time she was critically ill requiring vasopressors and a left radial arterial line. She did have an arterial duplex at that time which showed occlusion of her left radial artery but good flow into her left ulnar artery.  The patient was critically ill at that time so we opted for conservative management. The patient returns today for follow-up. She complains of intermittent pain in the tips of 4 out of 5 fingers. The first finger on the left hand has healed completely. She states that she does not wish any other interventions or any other procedures. She is taking tramadol 1 tablet twice a day. This is for pain in her left hand. 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  Review of systems: She denies shortness of breath or chest pain.  Current Outpatient Prescriptions on File Prior to Visit  Medication Sig Dispense Refill  . amLODipine (NORVASC) 5 MG tablet Take 10 mg by mouth daily.   2  . aspirin 81 MG EC tablet Take 81 mg by mouth daily.      Marland Kitchen atorvastatin (LIPITOR) 40 MG tablet Take 40 mg by mouth daily.    Marland Kitchen gabapentin (NEURONTIN) 100 MG capsule Take 100 mg by mouth 2 (two) times daily.   5  . polyethylene glycol (MIRALAX / GLYCOLAX) packet Take 17 g by mouth daily as needed for mild constipation. 14 each 0  . traMADol (ULTRAM) 50 MG tablet Take 1 tablet (50 mg total) by mouth every 6 (six) hours as needed for moderate pain. 30 tablet 0  . famotidine (PEPCID) 20 MG tablet Take 20 mg by mouth 2 (two) times daily.    . metoprolol (LOPRESSOR) 50 MG tablet Take 1 tablet (50 mg total) by mouth 2 (two) times daily. (Patient not taking: Reported on 02/08/2016) 60 tablet 0   No current facility-administered medications on file prior to visit.     Past Medical History:   Diagnosis Date  . Chronic renal insufficiency    Cr 1.38-1.93 in 2009  . Hypercholesteremia   . Hyperglycemia   . Hypertension   . PVD (peripheral vascular disease) ([place])    s/p LAKA  . Umbilical hernia    Past Surgical History:  Procedure Laterality Date  . AMPUTATION  06/06/2011   Procedure: AMPUTATION DIGIT;  Surgeon: Elam Dutch, MD;  [facility];  Service: Vascular;  Laterality: Left;  Transmetatarsal amputation left great toe  . AMPUTATION  06/12/2011   Procedure: AMPUTATION ABOVE KNEE;  Surgeon: Elam Dutch, MD;  Location: [place];  Service: Vascular;  Laterality: Left;  . FEMORAL-POPLITEAL BYPASS GRAFT  06/06/2011   Procedure: BYPASS GRAFT FEMORAL-POPLITEAL ARTERY;  Surgeon: Elam Dutch, MD;  Location[facility] OR;  Service: Vascular;  Laterality: Left;  left femoral to popliteal bypass with composite vein and propaten 20mm x 80 graft  . LOWER EXTREMITY ANGIOGRAM N/A 06/04/2011   Procedure: LOWER EXTREMITY ANGIOGRAM;  Surgeon: Angelia Mould, MD;  Location: [facility] CATH LAB;  Service: Cardiovascular;  Laterality: N/A;  . OMENTECTOMY  12/31/2015   Procedure: Partial OMENTECTOMY;  Surgeon: Georganna Skeans, MD;  Location: [place];  Service: General;;  . TUBAL LIGATION    . VENTRAL HERNIA REPAIR N/A 12/31/2015   Procedure: REPAIR INCARCERATED VENTRAL HERNIA, POSSIBLE BOWEL RESECTION;  Surgeon: Georganna Skeans, MD;  Location: [facility] OR;  Service: General;  Laterality: N/A;   Physical exam:  Vitals:   02/08/16 1103  BP: 120/61  Pulse: 77  Resp: 18  Temp: 97.3 F (36.3 C)  TempSrc: Oral  SpO2: 99%  Weight: 223 lb (101.2 kg)  Height: 5\' 8"  (1.727 m)    Left upper extremity: Nonpalpable brachial radial or ulnar pulse, digits 2 and 5 with dark eschar encompassing most of the distal phalanx. Digit for is only on the palmar aspect of the distal phalanx. The middle finger has a blister that is superficial.  Assessment: Slowly healing left hand still with some pain dry gangrenous  changes tips of fingers  Plan: Patient does not wish any further interventions and does not want an arteriogram at this point. I am doubtful that revascularization of her radial artery would make any difference at this point. Hopefully the digit tips will continue to heal but ultimately she may need to consider amputation of the tips of at least digits 2 and 5 for pain control. Currently we will maintain her on tramadol and increase this to once every 6 hours and she will see me back in a few weeks.  [name], MD [facility] Office: [phone] Pager: [phone]

## 2016-03-15 ENCOUNTER — Encounter: Payer: Self-pay | Admitting: Vascular Surgery

## 2016-03-21 ENCOUNTER — Ambulatory Visit (INDEPENDENT_AMBULATORY_CARE_PROVIDER_SITE_OTHER): Payer: Medicare Other | Admitting: Vascular Surgery

## 2016-03-21 ENCOUNTER — Encounter: Payer: Self-pay | Admitting: Vascular Surgery

## 2016-03-21 ENCOUNTER — Ambulatory Visit (INDEPENDENT_AMBULATORY_CARE_PROVIDER_SITE_OTHER): Payer: Medicare Other | Admitting: Podiatry

## 2016-03-21 VITALS — BP 131/70 | HR 70 | Temp 97.8°F | Resp 18 | Ht 68.0 in | Wt 223.0 lb

## 2016-03-21 DIAGNOSIS — I739 Peripheral vascular disease, unspecified: Secondary | ICD-10-CM | POA: Diagnosis not present

## 2016-03-21 DIAGNOSIS — L603 Nail dystrophy: Secondary | ICD-10-CM

## 2016-03-21 DIAGNOSIS — B351 Tinea unguium: Secondary | ICD-10-CM

## 2016-03-21 DIAGNOSIS — M79676 Pain in unspecified toe(s): Secondary | ICD-10-CM | POA: Diagnosis not present

## 2016-03-21 DIAGNOSIS — L608 Other nail disorders: Secondary | ICD-10-CM

## 2016-03-21 DIAGNOSIS — M79609 Pain in unspecified limb: Principal | ICD-10-CM

## 2016-03-21 DIAGNOSIS — E0843 Diabetes mellitus due to underlying condition with diabetic autonomic (poly)neuropathy: Secondary | ICD-10-CM

## 2016-03-21 NOTE — Progress Notes (Signed)
SUBJECTIVE Patient with a history of diabetes mellitus presents to office today complaining of elongated, thickened nails. Pain while ambulating in shoes. Patient is unable to trim their own nails.  Patient has a history of below-knee amputation left lower extremity  Allergies  Allergen Reactions  . Morphine And Related     AMS    OBJECTIVE General Patient is awake, alert, and oriented x 3 and in no acute distress. Derm Skin is dry and supple bilateral. Negative open lesions or macerations. Remaining integument unremarkable. Nails are tender, long, thickened and dystrophic with subungual debris, consistent with onychomycosis, 1-5. No signs of infection noted. Vasc  DP and PT pedal pulses Diminished left lower extremity. Temperature gradient within normal limits.  Neuro Epicritic and protective threshold sensation diminished bilaterally.  Musculoskeletal Exam No symptomatic pedal deformities noted. Muscular strength within normal limits.  ASSESSMENT 1. Diabetes Mellitus w/ peripheral neuropathy 2. Onychomycosis of nail due to dermatophyte bilateral 3. Pain in foot bilateral  PLAN OF CARE 1. Patient evaluated today. 2. Instructed to maintain good pedal hygiene and foot care. Stressed importance of controlling blood sugar.  3. Mechanical debridement of nails 1-5 bilaterally performed using a nail nipper. Filed with dremel without incident.  4. Return to clinic in 3 mos.     Edrick Kins, DPM [facility]  Dr. Edrick Kins, [address]                                        [place], [place] [address]                Office [phone]  Fax [phone]

## 2016-03-21 NOTE — Progress Notes (Signed)
Patient is a 70 year old female who returns for follow-up today after recent hospital consultation for ischemia of multiple digits in the left hand. At that time she was critically ill requiring vasopressors and a left radial arterial line. She did have an arterial duplex at that time which showed occlusion of her left radial artery but good flow into her left ulnar artery.  The patient was critically ill at that time so we opted for conservative management. The patient returns today for follow-up. She complains of no pain in the tips fingers. The first finger on the left hand has healed completely. She states that she does not wish any other interventions or any other procedures. She is not requiring any pain medication. 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   Review of systems: She denies shortness of breath or chest pain.         Current Outpatient Prescriptions on File Prior to Visit  Medication Sig Dispense Refill  . amLODipine (NORVASC) 5 MG tablet Take 10 mg by mouth daily.    2  . aspirin 81 MG EC tablet Take 81 mg by mouth daily.        Marland Kitchen atorvastatin (LIPITOR) 40 MG tablet Take 40 mg by mouth daily.      Marland Kitchen gabapentin (NEURONTIN) 100 MG capsule Take 100 mg by mouth 2 (two) times daily.    5  . polyethylene glycol (MIRALAX / GLYCOLAX) packet Take 17 g by mouth daily as needed for mild constipation. 14 each 0  . traMADol (ULTRAM) 50 MG tablet Take 1 tablet (50 mg total) by mouth every 6 (six) hours as needed for moderate pain. 30 tablet 0  . famotidine (PEPCID) 20 MG tablet Take 20 mg by mouth 2 (two) times daily.      . metoprolol (LOPRESSOR) 50 MG tablet Take 1 tablet (50 mg total) by mouth 2 (two) times daily. (Patient not taking: Reported on 02/08/2016) 60 tablet 0    No current facility-administered medications on file prior to visit.           Past Medical History:  Diagnosis Date  . Chronic renal  insufficiency      Cr 1.38-1.93 in 2009  . Hypercholesteremia    . Hyperglycemia    . Hypertension    . PVD (peripheral vascular disease) ([place])      s/p LAKA  . Umbilical hernia           Past Surgical History:  Procedure Laterality Date  . AMPUTATION   06/06/2011    Procedure: AMPUTATION DIGIT;  Surgeon: Elam Dutch, MD;  [facility];  Service: Vascular;  Laterality: Left;  Transmetatarsal amputation left great toe  . AMPUTATION   06/12/2011    Procedure: AMPUTATION ABOVE KNEE;  Surgeon: Elam Dutch, MD;  Location: [place];  Service: Vascular;  Laterality: Left;  . FEMORAL-POPLITEAL BYPASS GRAFT   06/06/2011    Procedure: BYPASS GRAFT FEMORAL-POPLITEAL ARTERY;  Surgeon: Elam Dutch, MD;  Location: [facility] OR;  Service: Vascular;  Laterality: Left;  left femoral to popliteal bypass with composite vein and propaten 46mm x 80 graft  . LOWER EXTREMITY ANGIOGRAM N/A 06/04/2011    Procedure: LOWER EXTREMITY ANGIOGRAM;  Surgeon: Angelia Mould, MD;  Location: [facility] CATH LAB;  Service: Cardiovascular;  Laterality: N/A;  . OMENTECTOMY   12/31/2015    Procedure: Partial OMENTECTOMY;  Surgeon: Georganna Skeans, MD;  Location: [place];  Service: General;;  . TUBAL LIGATION      . VENTRAL HERNIA REPAIR N/A 12/31/2015    Procedure: REPAIR INCARCERATED VENTRAL HERNIA, POSSIBLE BOWEL RESECTION;  Surgeon: Georganna Skeans, MD;  Location: [place];  Service: General;  Laterality: N/A;    Physical exam:    Vitals:   03/21/16 1009  BP: 131/70  Pulse: 70  Resp: 18  Temp: 97.8 F (36.6 C)  TempSrc: Oral  SpO2: 98%  Weight: 223 lb (101.2 kg)  Height: 5\' 8"  (1.727 m)     Left upper extremity: Nonpalpable brachial radial or ulnar pulse, digits 2 and 5 with dark eschar encompassing most of the distal phalanx. Digit 4 is only on the palmar aspect of the distal phalanx. The middle finger has Dried up considerably from her last office visit   Assessment: Slowly healing left hand still with no pain dry  gangrenous changes tips of fingers   Plan: Patient does not wish any further interventions and does not want an arteriogram at this point. I am doubtful that revascularization of her radial artery would make any difference at this point. Hopefully the digit tips will continue to heal but ultimately she may need to consider amputation of the tips of at least digits 2 and 5 but this would only be for infection her pain control.  Currently she has none. She will follow-up on as-needed basis.    [name], MD [facility] Office: [phone] Pager: [phone]

## 2016-06-20 ENCOUNTER — Ambulatory Visit: Payer: Medicare Other | Admitting: Podiatry

## 2017-05-30 ENCOUNTER — Encounter (HOSPITAL_COMMUNITY): Payer: Self-pay | Admitting: Emergency Medicine

## 2017-05-30 ENCOUNTER — Emergency Department (HOSPITAL_COMMUNITY)
Admission: EM | Admit: 2017-05-30 | Discharge: 2017-05-30 | Disposition: A | Discharge status: Home / Self Care | Payer: Medicare Other | Attending: Emergency Medicine | Admitting: Emergency Medicine

## 2017-05-30 ENCOUNTER — Other Ambulatory Visit: Payer: Self-pay | Admitting: Internal Medicine

## 2017-05-30 ENCOUNTER — Other Ambulatory Visit: Payer: Self-pay

## 2017-05-30 DIAGNOSIS — N289 Disorder of kidney and ureter, unspecified: Secondary | ICD-10-CM | POA: Insufficient documentation

## 2017-05-30 DIAGNOSIS — E875 Hyperkalemia: Secondary | ICD-10-CM | POA: Diagnosis not present

## 2017-05-30 DIAGNOSIS — Z87891 Personal history of nicotine dependence: Secondary | ICD-10-CM | POA: Insufficient documentation

## 2017-05-30 DIAGNOSIS — I252 Old myocardial infarction: Secondary | ICD-10-CM | POA: Insufficient documentation

## 2017-05-30 DIAGNOSIS — N183 Chronic kidney disease, stage 3 (moderate): Secondary | ICD-10-CM | POA: Insufficient documentation

## 2017-05-30 DIAGNOSIS — D649 Anemia, unspecified: Secondary | ICD-10-CM | POA: Diagnosis not present

## 2017-05-30 DIAGNOSIS — I739 Peripheral vascular disease, unspecified: Secondary | ICD-10-CM

## 2017-05-30 DIAGNOSIS — I5031 Acute diastolic (congestive) heart failure: Secondary | ICD-10-CM | POA: Insufficient documentation

## 2017-05-30 DIAGNOSIS — Z7982 Long term (current) use of aspirin: Secondary | ICD-10-CM | POA: Diagnosis not present

## 2017-05-30 DIAGNOSIS — Z79899 Other long term (current) drug therapy: Secondary | ICD-10-CM | POA: Diagnosis not present

## 2017-05-30 DIAGNOSIS — I130 Hypertensive heart and chronic kidney disease with heart failure and stage 1 through stage 4 chronic kidney disease, or unspecified chronic kidney disease: Secondary | ICD-10-CM | POA: Insufficient documentation

## 2017-05-30 LAB — CBC WITH DIFFERENTIAL/PLATELET
Basophils Absolute: 0.1 10*3/uL (ref 0.0–0.1)
Basophils Relative: 1 %
Eosinophils Absolute: 1 10*3/uL — ABNORMAL HIGH (ref 0.0–0.7)
Eosinophils Relative: 11 %
HCT: 31.7 % — ABNORMAL LOW (ref 36.0–46.0)
Hemoglobin: 9.8 g/dL — ABNORMAL LOW (ref 12.0–15.0)
Lymphocytes Relative: 21 %
Lymphs Abs: 2 10*3/uL (ref 0.7–4.0)
MCH: 28.2 pg (ref 26.0–34.0)
MCHC: 30.9 g/dL (ref 30.0–36.0)
MCV: 91.1 fL (ref 78.0–100.0)
Monocytes Absolute: 1.2 10*3/uL — ABNORMAL HIGH (ref 0.1–1.0)
Monocytes Relative: 13 %
Neutro Abs: 5.3 10*3/uL (ref 1.7–7.7)
Neutrophils Relative %: 54 %
Platelets: 331 10*3/uL (ref 150–400)
RBC: 3.48 MIL/uL — ABNORMAL LOW (ref 3.87–5.11)
RDW: 15.3 % (ref 11.5–15.5)
WBC: 9.6 10*3/uL (ref 4.0–10.5)

## 2017-05-30 LAB — BASIC METABOLIC PANEL
Anion gap: 7 (ref 5–15)
BUN: 30 mg/dL — ABNORMAL HIGH (ref 6–20)
CO2: 18 mmol/L — ABNORMAL LOW (ref 22–32)
Calcium: 8 mg/dL — ABNORMAL LOW (ref 8.9–10.3)
Chloride: 113 mmol/L — ABNORMAL HIGH (ref 101–111)
Creatinine, Ser: 2.06 mg/dL — ABNORMAL HIGH (ref 0.44–1.00)
GFR calc Af Amer: 27 mL/min — ABNORMAL LOW (ref >60)
GFR calc non Af Amer: 23 mL/min — ABNORMAL LOW (ref >60)
Glucose, Bld: 107 mg/dL — ABNORMAL HIGH (ref 65–99)
Potassium: 5.6 mmol/L — ABNORMAL HIGH (ref 3.5–5.1)
Sodium: 138 mmol/L (ref 135–145)

## 2017-05-30 MED ORDER — SODIUM POLYSTYRENE SULFONATE 15 GM/60ML PO SUSP
30.0000 g | Freq: Once | ORAL | Status: AC
  Administered 2017-05-30: 30 g via ORAL
  Filled 2017-05-30: qty 120

## 2017-05-30 NOTE — ED Provider Notes (Signed)
[facility] EMERGENCY DEPARTMENT Provider Note   CSN: [id number] Arrival date & time: 05/30/17  1751     History   Chief Complaint Chief Complaint  Patient presents with  . Abnormal Lab    HPI Stephanie Griffith is a 72 y.o. female.  The history is provided by the patient.  She has history of chronic renal insufficiency, hypertension, elevated blood glucose, hyperlipidemia, peripheral vascular disease and was told to come here by her PCP because of an elevated potassium.  She was in her PCPs office earlier today and had blood drawn.  They got a call at home stating her potassium was high and that she had to come to the hospital right away.  They did not tell her what her potassium level was.  Patient states she is feeling generally fatigued, but has no other complaints.  Of note, she is not on any diuretics, ACE inhibitors, angiotensin receptor blockers, or any potassium sparing agents.  Past Medical History:  Diagnosis Date  . Chronic renal insufficiency    Cr 1.38-1.93 in 2009  . Hypercholesteremia   . Hyperglycemia   . Hypertension   . PVD (peripheral vascular disease) ([place])    s/p LAKA  . Umbilical hernia     Patient Active Problem List   Diagnosis Date Noted  . Pressure injury of skin 01/03/2016  . Acute hypoxemic respiratory failure ([place])   . Acute pulmonary edema ([facility])   . Hyperkalemia 12/30/2015  . Incarcerated hernia 12/30/2015  . Phantom limb pain ([place]) 08/13/2011  . PVD (peripheral vascular disease) with claudication ([place]) 07/26/2011  . S/P AKA (above knee amputation) ([place]) 06/15/2011  . NSTEMI (non-ST elevated myocardial infarction) ([place]) 06/09/2011  . Acute diastolic heart failure ([place]) 06/09/2011  . Atrial fibrillation ([place]) 06/08/2011  . Cholelithiasis and cholecystitis without obstruction 06/03/2011  . Ventral hernia 06/03/2011  . Acute on chronic renal failure ([place]) 06/02/2011  . PAD (peripheral artery disease) ([place]) 06/02/2011  . Dry  gangrene ([place]) 05/31/2011  . Abdominal pain 05/31/2011  . Leukocytosis 05/31/2011  . Hyponatremia 05/31/2011  . Hyperglycemia 05/31/2011  . ANEMIA-NOS 12/22/2007  . Chronic kidney disease (CKD), stage III (moderate) ([place]) 12/22/2007  . CHEST PAIN, HX OF 12/22/2007  . Essential hypertension, benign 12/10/2007    Past Surgical History:  Procedure Laterality Date  . AMPUTATION  06/06/2011   Procedure: AMPUTATION DIGIT;  Surgeon: Elam Dutch, MD;  Location: [facility] OR;  Service: Vascular;  Laterality: Left;  Transmetatarsal amputation left great toe  . AMPUTATION  06/12/2011   Procedure: AMPUTATION ABOVE KNEE;  Surgeon: Elam Dutch, MD;  Location: [place];  Service: Vascular;  Laterality: Left;  . FEMORAL-POPLITEAL BYPASS GRAFT  06/06/2011   Procedure: BYPASS GRAFT FEMORAL-POPLITEAL ARTERY;  Surgeon: Elam Dutch, MD;  Location: [facility] OR;  Service: Vascular;  Laterality: Left;  left femoral to popliteal bypass with composite vein and propaten 55mm x 80 graft  . LOWER EXTREMITY ANGIOGRAM N/A 06/04/2011   Procedure: LOWER EXTREMITY ANGIOGRAM;  Surgeon: Angelia Mould, MD;  Location: [facility] CATH LAB;  Service: Cardiovascular;  Laterality: N/A;  . OMENTECTOMY  12/31/2015   Procedure: Partial OMENTECTOMY;  Surgeon: Georganna Skeans, MD;  Location: [place];  Service: General;;  . TUBAL LIGATION    . VENTRAL HERNIA REPAIR N/A 12/31/2015   Procedure: REPAIR INCARCERATED VENTRAL HERNIA, POSSIBLE BOWEL RESECTION;  Surgeon: Georganna Skeans, MD;  Location: [place];  Service: General;  Laterality: N/A;    OB History  No data available       Home Medications    Prior to Admission medications   Medication Sig Start Date End Date Taking? Authorizing Provider  amLODipine (NORVASC) 5 MG tablet Take 10 mg by mouth daily.  07/08/14   [provider]  aspirin 81 MG EC tablet Take 81 mg by mouth daily.      [provider]  atorvastatin (LIPITOR) 40 MG tablet Take 40 mg by mouth daily.     [provider]  famotidine (PEPCID) 20 MG tablet Take 20 mg by mouth 2 (two) times daily.    [provider]  gabapentin (NEURONTIN) 100 MG capsule Take 100 mg by mouth 2 (two) times daily.  06/23/14   [provider]  metoprolol (LOPRESSOR) 50 MG tablet Take 1 tablet (50 mg total) by mouth 2 (two) times daily. 01/10/16   Domenic Polite, MD  polyethylene glycol [facility] Floria Raveling) packet Take 17 g by mouth daily as needed for mild constipation. 01/10/16   Domenic Polite, MD  traMADol (ULTRAM) 50 MG tablet Take 1 tablet (50 mg total) by mouth every 6 (six) hours as needed for moderate pain. Patient not taking: Reported on 03/21/2016 01/10/16   Domenic Polite, MD    Family History Family History  Problem Relation Age of Onset  . Heart attack Father   . Heart disease Father   . Diabetes Mother   . Hypertension Mother   . Heart attack Brother     Social History Social History   Tobacco Use  . Smoking status: Former Smoker    Packs/day: 2.00    Years: 50.00    Pack years: 100.00    Last attempt to quit: 06/01/2011    Years since quitting: 6.0  . Smokeless tobacco: Never Used  Substance Use Topics  . Alcohol use: No  . Drug use: No     Allergies   Morphine and related   Review of Systems Review of Systems  All other systems reviewed and are negative.    Physical Exam Updated Vital Signs BP (!) 160/61   Pulse 92   Temp 98.4 F (36.9 C) (Oral)   Resp 20   SpO2 99%   Physical Exam  Nursing note and vitals reviewed.  72 year old female, resting comfortably and in no acute distress. Vital signs are significant for elevated blood pressure. Oxygen saturation is 99%, which is normal. Head is normocephalic and atraumatic. PERRLA, EOMI. Oropharynx is clear. Neck is nontender and supple without adenopathy or JVD. Back is nontender and there is no CVA tenderness. Lungs are clear without rales, wheezes, or rhonchi. Chest is nontender. Heart  has regular rate and rhythm without murmur. Abdomen is soft, flat, nontender without masses or hepatosplenomegaly and peristalsis is normoactive. Extremities: Left above-the-knee amputation.  Brawny edema of the right lower extremity. Skin is warm and dry without rash. Neurologic: Mental status is normal, cranial nerves are intact, there are no motor or sensory deficits.  ED Treatments / Results  Labs (all labs ordered are listed, but only abnormal results are displayed) Labs Reviewed  CBC WITH DIFFERENTIAL/PLATELET - Abnormal; Notable for the following components:      Result Value   RBC 3.48 (*)    Hemoglobin 9.8 (*)    HCT 31.7 (*)    Monocytes Absolute 1.2 (*)    Eosinophils Absolute 1.0 (*)    All other components within normal limits  BASIC METABOLIC PANEL - Abnormal; Notable for the following  components:   Potassium 5.6 (*)    Chloride 113 (*)    CO2 18 (*)    Glucose, Bld 107 (*)    BUN 30 (*)    Creatinine, Ser 2.06 (*)    Calcium 8.0 (*)    GFR calc non Af Amer 23 (*)    GFR calc Af Amer 27 (*)    All other components within normal limits    EKG  EKG Interpretation  Date/Time:  Thursday May 30 2017 18:27:46 EST Ventricular Rate:  87 PR Interval:  148 QRS Duration: 76 QT Interval:  360 QTC Calculation: 433 R Axis:   49 Text Interpretation:  Normal sinus rhythm Low voltage QRS Possible Anterolateral infarct , age undetermined Abnormal ECG When compared with ECG of 12/31/2015, Sinus rhythm has replaced Atrial fibrillation with rapid ventricular response Confirmed by Delora Fuel ([id number]) on 05/30/2017 11:09:47 PM      Procedures Procedures (including critical care time)  Medications Ordered in ED Medications  sodium polystyrene (KAYEXALATE) 15 GM/60ML suspension 30 g (not administered)     Initial Impression / Assessment and Plan / ED Course  I have reviewed the triage vital signs and the nursing notes.  Pertinent lab esults that were available during  my care of the patient were reviewed by me and considered in my medical decision making (see chart for details).  Hyperkalemia in the setting of renal insufficiency.  I do not have access to the outpatient labs, but potassium here is 5.6.  Renal insufficiency and anemia are present which are not significantly changed from baseline.  I have reviewed her medications and they are not changed from the list that is on the chart.  Specifically, no diuretics, potassium sparing agents, ACE inhibitors, angiotensin receptor blockers.  ECG shows no changes of hyperkalemia.  Potassium at this level is not immediately dangerous.  She is given a dose of sodium polystyrene and is referred back to her PCP to recheck her potassium in the next several days.  If potassium stays elevated, she will either need to be on a diuretic, or on a regular dose of sodium polystyrene.  Final Clinical Impressions(s) / ED Diagnoses   Final diagnoses:  Hyperkalemia  Renal insufficiency  Normochromic normocytic anemia    ED Discharge Orders    None       Delora Fuel, MD 26/83/41 2333

## 2017-05-30 NOTE — ED Provider Notes (Signed)
Patient placed in Quick Look pathway, seen and evaluated   Chief Complaint: abnormal lab  HPI:  72 y.o. female sent here by PCP for elevated potassium level. Patient denies any symptoms but was told to come to the ED.  ROS: patient has no complaints  Physical Exam:   Gen: No distress  Neuro: Awake and Alert      Focused Exam:    Initiation of care has begun. The patient has been counseled on the process, plan, and necessity for staying for the completion/evaluation, and the remainder of the medical screening examination    Ashley Murrain, NP 68/25/74 [id number]    Delora Fuel, MD 21/74/71 2326

## 2017-05-30 NOTE — Discharge Instructions (Signed)
Your potassium was a little high today - 5.6. You got some medication to bring it down. You will need to have it rechecked in a few days. It it goes back up, you will need to be on medication to keep it down.

## 2017-05-30 NOTE — ED Notes (Signed)
Family member upset about been on the hallway bed, family states she believes that her mom's needs more care than the one that you can give on a hallway bed.

## 2017-05-30 NOTE — ED Triage Notes (Signed)
Pt sent by PCP for critical high potassium and decreased kidney function. Pt has no complaints at this time. Hope, NP aware of pt, verbal order CBC, BMET, EKG.

## 2017-05-30 NOTE — ED Notes (Signed)
ED provider at the bedside.  

## 2017-06-03 ENCOUNTER — Other Ambulatory Visit: Payer: Self-pay

## 2017-06-03 DIAGNOSIS — I998 Other disorder of circulatory system: Secondary | ICD-10-CM

## 2017-06-06 ENCOUNTER — Other Ambulatory Visit: Payer: Self-pay | Admitting: Internal Medicine

## 2017-06-06 ENCOUNTER — Ambulatory Visit
Admission: RE | Admit: 2017-06-06 | Discharge: 2017-06-06 | Disposition: A | Discharge status: Home / Self Care | Payer: Medicare Other | Source: Ambulatory Visit | Attending: Internal Medicine | Admitting: Internal Medicine

## 2017-06-06 DIAGNOSIS — I739 Peripheral vascular disease, unspecified: Secondary | ICD-10-CM

## 2017-06-11 ENCOUNTER — Encounter (HOSPITAL_BASED_OUTPATIENT_CLINIC_OR_DEPARTMENT_OTHER): Payer: Medicare Other | Attending: Internal Medicine

## 2017-06-27 ENCOUNTER — Other Ambulatory Visit: Payer: Self-pay | Admitting: Vascular Surgery

## 2017-06-27 ENCOUNTER — Ambulatory Visit (HOSPITAL_COMMUNITY)
Admission: RE | Admit: 2017-06-27 | Discharge: 2017-06-27 | Disposition: A | Discharge status: Home / Self Care | Payer: Medicare Other | Source: Ambulatory Visit | Attending: Vascular Surgery | Admitting: Vascular Surgery

## 2017-06-27 DIAGNOSIS — I10 Essential (primary) hypertension: Secondary | ICD-10-CM | POA: Insufficient documentation

## 2017-06-27 DIAGNOSIS — I708 Atherosclerosis of other arteries: Secondary | ICD-10-CM | POA: Diagnosis not present

## 2017-06-27 DIAGNOSIS — R200 Anesthesia of skin: Secondary | ICD-10-CM | POA: Diagnosis present

## 2017-06-27 DIAGNOSIS — I998 Other disorder of circulatory system: Secondary | ICD-10-CM

## 2017-06-27 DIAGNOSIS — Z89612 Acquired absence of left leg above knee: Secondary | ICD-10-CM | POA: Diagnosis not present

## 2017-07-04 ENCOUNTER — Ambulatory Visit (INDEPENDENT_AMBULATORY_CARE_PROVIDER_SITE_OTHER): Payer: Medicare Other | Admitting: Vascular Surgery

## 2017-07-04 ENCOUNTER — Encounter: Payer: Self-pay | Admitting: Vascular Surgery

## 2017-07-04 ENCOUNTER — Other Ambulatory Visit: Payer: Self-pay

## 2017-07-04 VITALS — BP 149/80 | HR 74 | Temp 97.8°F | Resp 16 | Ht 68.0 in | Wt 223.0 lb

## 2017-07-04 DIAGNOSIS — I739 Peripheral vascular disease, unspecified: Secondary | ICD-10-CM | POA: Diagnosis not present

## 2017-07-04 NOTE — Progress Notes (Signed)
Patient is a 72 year old female who presents today with complaints of pain in her left hand.  Developed ischemia of multiple digits in her left hand while requiring vasopressors in 2016.  She had a duplex ultrasound at that time which showed occlusion of her left radial artery but good flow into her left ulnar artery.  Does not really complain of pain in the tips of her digits.  However she complains that the left hand aches almost daily for intermittent periods.  She has not taken any narcotic pain medication because she fears getting addicted to this.  She also denies taking any nonsteroidals or Tylenol.  She has previously undergone a left leg amputation in 2013.  Review of systems: She denies shortness of breath.  She denies chest pain.  However, she is in a wheelchair and not overall very active.  Past Medical History:  Diagnosis Date  . Chronic renal insufficiency    Cr 1.38-1.93 in 2009  . Hypercholesteremia   . Hyperglycemia   . Hypertension   . PVD (peripheral vascular disease) ([place])    s/p LAKA  . Umbilical hernia     Past Surgical History:  Procedure Laterality Date  . AMPUTATION  06/06/2011   Procedure: AMPUTATION DIGIT;  Surgeon: Elam Dutch, MD;  [facility] OR;  Service: Vascular;  Laterality: Left;  Transmetatarsal amputation left great toe  . AMPUTATION  06/12/2011   Procedure: AMPUTATION ABOVE KNEE;  Surgeon: Elam Dutch, MD;  Location: [place];  Service: Vascular;  Laterality: Left;  . FEMORAL-POPLITEAL BYPASS GRAFT  06/06/2011   Procedure: BYPASS GRAFT FEMORAL-POPLITEAL ARTERY;  Surgeon: Elam Dutch, MD;  Location: [facility] OR;  Service: Vascular;  Laterality: Left;  left femoral to popliteal bypass with composite vein and propaten 30mm x 80 graft  . LOWER EXTREMITY ANGIOGRAM N/A 06/04/2011   Procedure: LOWER EXTREMITY ANGIOGRAM;  Surgeon: Angelia Mould, MD;  Location[facility] CATH LAB;  Service: Cardiovascular;  Laterality: N/A;  . OMENTECTOMY  12/31/2015   Procedure: Partial OMENTECTOMY;  Surgeon: Georganna Skeans, MD;  Location: [place];  Service: General;;  . TUBAL LIGATION    . VENTRAL HERNIA REPAIR N/A 12/31/2015   Procedure: REPAIR INCARCERATED VENTRAL HERNIA, POSSIBLE BOWEL RESECTION;  Surgeon: Georganna Skeans, MD;  Location: [place];  Service: General;  Laterality: N/A;   Physical exam:  Vitals:   07/04/17 1002  BP: (!) 149/80  Pulse: 74  Resp: 16  Temp: 97.8 F (36.6 C)  TempSrc: Oral  SpO2: 100%  Weight: 223 lb (101.2 kg)  Height: 5\' 8"  (1.727 m)    Extremities: Right upper extremity 2+ brachial radial pulse left upper extremity 2+ brachial absent radial pulse  Skin: Small area of dry gangrene tip left second digit and left fifth digit tip  Data: Less than 50% stenosis mid left radial artery with diffuse plaque.  Triphasic axillary brachial and ulnar artery Doppler flow which was normal.  Assessment: Gangrene tip of 2 digits left hand.  However, this does not really cause her pain.  She does not have any area of reconstructable arterial occlusive disease to improve her symptoms.  Most likely her left hand pain is secondary to neurologic injury from her acute ischemia event.  Or she may have some underlying component of chronic ischemia.  Regardless, there are no vascular surgical interventions that would improve this pain.  I discussed with her today the possibility of amputating the tips of the digits but she is not interested in this.  I  also discussed with her today using extra strength Tylenol for pain control that is nonaddictive.  She will consider this.  I also discussed with her the possibility of long-term chronic pain medication and narcotic form at a low dose that could be managed by Dr. [name] or myself if her pain is not controlled with Tylenol.  She has grave concerns about becoming addicted to this and I did discuss with her that if this is in a controlled circumstance and low-dose that this would be a completely acceptable  form of controlling her pain if necessary.  Plan: She will try extra strength Tylenol.  She will follow-up with me in 6 weeks time to see if her pain symptoms are improved.  [name], MD [facility] [place] Office: [phone] Pager: [phone]

## 2017-07-25 ENCOUNTER — Encounter (HOSPITAL_BASED_OUTPATIENT_CLINIC_OR_DEPARTMENT_OTHER): Payer: Medicare Other | Attending: Internal Medicine

## 2017-07-25 DIAGNOSIS — N189 Chronic kidney disease, unspecified: Secondary | ICD-10-CM | POA: Insufficient documentation

## 2017-07-25 DIAGNOSIS — Z89612 Acquired absence of left leg above knee: Secondary | ICD-10-CM | POA: Diagnosis not present

## 2017-07-25 DIAGNOSIS — I890 Lymphedema, not elsewhere classified: Secondary | ICD-10-CM | POA: Insufficient documentation

## 2017-07-25 DIAGNOSIS — Z87891 Personal history of nicotine dependence: Secondary | ICD-10-CM | POA: Diagnosis not present

## 2017-07-25 DIAGNOSIS — I70201 Unspecified atherosclerosis of native arteries of extremities, right leg: Secondary | ICD-10-CM | POA: Diagnosis not present

## 2017-08-01 ENCOUNTER — Encounter (HOSPITAL_BASED_OUTPATIENT_CLINIC_OR_DEPARTMENT_OTHER): Payer: Medicare Other | Attending: Internal Medicine

## 2017-08-01 DIAGNOSIS — I890 Lymphedema, not elsewhere classified: Secondary | ICD-10-CM | POA: Insufficient documentation

## 2017-08-01 DIAGNOSIS — I70201 Unspecified atherosclerosis of native arteries of extremities, right leg: Secondary | ICD-10-CM | POA: Diagnosis not present

## 2017-08-08 DIAGNOSIS — I890 Lymphedema, not elsewhere classified: Secondary | ICD-10-CM | POA: Diagnosis not present

## 2017-08-15 ENCOUNTER — Ambulatory Visit: Payer: Medicare Other | Admitting: Vascular Surgery

## 2017-08-15 ENCOUNTER — Encounter: Payer: Self-pay | Admitting: Podiatry

## 2017-08-28 ENCOUNTER — Ambulatory Visit: Payer: Medicare Other | Admitting: Podiatry

## 2017-08-30 ENCOUNTER — Ambulatory Visit: Payer: Medicare Other | Admitting: Podiatry

## 2018-09-18 ENCOUNTER — Other Ambulatory Visit: Payer: Self-pay | Admitting: Internal Medicine

## 2018-09-18 DIAGNOSIS — Z1231 Encounter for screening mammogram for malignant neoplasm of breast: Secondary | ICD-10-CM

## 2018-10-30 ENCOUNTER — Ambulatory Visit: Payer: Medicare Other

## 2019-02-05 ENCOUNTER — Emergency Department (HOSPITAL_COMMUNITY): Payer: Medicare Other

## 2019-02-05 ENCOUNTER — Encounter (HOSPITAL_COMMUNITY): Payer: Self-pay | Admitting: Emergency Medicine

## 2019-02-05 ENCOUNTER — Inpatient Hospital Stay (HOSPITAL_COMMUNITY)
Admission: EM | Admit: 2019-02-05 | Discharge: 2019-02-18 | DRG: 286 | Disposition: A | Discharge status: Home Health | Payer: Medicare Other | Attending: Internal Medicine | Admitting: Internal Medicine

## 2019-02-05 DIAGNOSIS — I471 Supraventricular tachycardia: Secondary | ICD-10-CM | POA: Diagnosis not present

## 2019-02-05 DIAGNOSIS — I252 Old myocardial infarction: Secondary | ICD-10-CM

## 2019-02-05 DIAGNOSIS — E872 Acidosis, unspecified: Secondary | ICD-10-CM

## 2019-02-05 DIAGNOSIS — Z885 Allergy status to narcotic agent status: Secondary | ICD-10-CM

## 2019-02-05 DIAGNOSIS — D649 Anemia, unspecified: Secondary | ICD-10-CM | POA: Diagnosis present

## 2019-02-05 DIAGNOSIS — I5033 Acute on chronic diastolic (congestive) heart failure: Secondary | ICD-10-CM

## 2019-02-05 DIAGNOSIS — R778 Other specified abnormalities of plasma proteins: Secondary | ICD-10-CM | POA: Diagnosis present

## 2019-02-05 DIAGNOSIS — E785 Hyperlipidemia, unspecified: Secondary | ICD-10-CM | POA: Diagnosis present

## 2019-02-05 DIAGNOSIS — Z79899 Other long term (current) drug therapy: Secondary | ICD-10-CM

## 2019-02-05 DIAGNOSIS — I5082 Biventricular heart failure: Secondary | ICD-10-CM | POA: Diagnosis present

## 2019-02-05 DIAGNOSIS — I2721 Secondary pulmonary arterial hypertension: Secondary | ICD-10-CM | POA: Diagnosis present

## 2019-02-05 DIAGNOSIS — Z7982 Long term (current) use of aspirin: Secondary | ICD-10-CM

## 2019-02-05 DIAGNOSIS — Z89612 Acquired absence of left leg above knee: Secondary | ICD-10-CM

## 2019-02-05 DIAGNOSIS — Z20828 Contact with and (suspected) exposure to other viral communicable diseases: Secondary | ICD-10-CM | POA: Diagnosis present

## 2019-02-05 DIAGNOSIS — I130 Hypertensive heart and chronic kidney disease with heart failure and stage 1 through stage 4 chronic kidney disease, or unspecified chronic kidney disease: Principal | ICD-10-CM | POA: Diagnosis present

## 2019-02-05 DIAGNOSIS — E875 Hyperkalemia: Secondary | ICD-10-CM | POA: Diagnosis present

## 2019-02-05 DIAGNOSIS — I10 Essential (primary) hypertension: Secondary | ICD-10-CM | POA: Diagnosis present

## 2019-02-05 DIAGNOSIS — E669 Obesity, unspecified: Secondary | ICD-10-CM | POA: Diagnosis present

## 2019-02-05 DIAGNOSIS — D72829 Elevated white blood cell count, unspecified: Secondary | ICD-10-CM | POA: Diagnosis present

## 2019-02-05 DIAGNOSIS — I739 Peripheral vascular disease, unspecified: Secondary | ICD-10-CM | POA: Diagnosis present

## 2019-02-05 DIAGNOSIS — I480 Paroxysmal atrial fibrillation: Secondary | ICD-10-CM | POA: Diagnosis present

## 2019-02-05 DIAGNOSIS — Z8249 Family history of ischemic heart disease and other diseases of the circulatory system: Secondary | ICD-10-CM

## 2019-02-05 DIAGNOSIS — Z87891 Personal history of nicotine dependence: Secondary | ICD-10-CM

## 2019-02-05 DIAGNOSIS — L97419 Non-pressure chronic ulcer of right heel and midfoot with unspecified severity: Secondary | ICD-10-CM | POA: Diagnosis present

## 2019-02-05 DIAGNOSIS — I878 Other specified disorders of veins: Secondary | ICD-10-CM | POA: Diagnosis present

## 2019-02-05 DIAGNOSIS — D638 Anemia in other chronic diseases classified elsewhere: Secondary | ICD-10-CM | POA: Diagnosis present

## 2019-02-05 DIAGNOSIS — Z833 Family history of diabetes mellitus: Secondary | ICD-10-CM

## 2019-02-05 DIAGNOSIS — J449 Chronic obstructive pulmonary disease, unspecified: Secondary | ICD-10-CM | POA: Diagnosis present

## 2019-02-05 DIAGNOSIS — R7989 Other specified abnormal findings of blood chemistry: Secondary | ICD-10-CM

## 2019-02-05 DIAGNOSIS — N179 Acute kidney failure, unspecified: Secondary | ICD-10-CM

## 2019-02-05 DIAGNOSIS — N184 Chronic kidney disease, stage 4 (severe): Secondary | ICD-10-CM

## 2019-02-05 DIAGNOSIS — Z23 Encounter for immunization: Secondary | ICD-10-CM

## 2019-02-05 DIAGNOSIS — Z6827 Body mass index (BMI) 27.0-27.9, adult: Secondary | ICD-10-CM

## 2019-02-05 HISTORY — DX: Chronic diastolic (congestive) heart failure: I50.32

## 2019-02-05 HISTORY — DX: Anemia in other chronic diseases classified elsewhere: D63.8

## 2019-02-05 HISTORY — DX: Pulmonary hypertension, unspecified: I27.20

## 2019-02-05 HISTORY — DX: Paroxysmal atrial fibrillation: I48.0

## 2019-02-05 HISTORY — DX: Chronic kidney disease, stage 4 (severe): N18.4

## 2019-02-05 HISTORY — DX: Supraventricular tachycardia: I47.1

## 2019-02-05 HISTORY — DX: Other supraventricular tachycardia: I47.19

## 2019-02-05 HISTORY — DX: Peripheral vascular disease, unspecified: I73.9

## 2019-02-05 HISTORY — DX: Hyperlipidemia, unspecified: E78.5

## 2019-02-05 HISTORY — DX: Chronic obstructive pulmonary disease, unspecified: J44.9

## 2019-02-05 LAB — CBC WITH DIFFERENTIAL/PLATELET
Abs Immature Granulocytes: 0.16 10*3/uL — ABNORMAL HIGH (ref 0.00–0.07)
Basophils Absolute: 0.1 10*3/uL (ref 0.0–0.1)
Basophils Relative: 1 %
Eosinophils Absolute: 0.3 10*3/uL (ref 0.0–0.5)
Eosinophils Relative: 3 %
HCT: 27.9 % — ABNORMAL LOW (ref 36.0–46.0)
Hemoglobin: 8.4 g/dL — ABNORMAL LOW (ref 12.0–15.0)
Immature Granulocytes: 1 %
Lymphocytes Relative: 12 %
Lymphs Abs: 1.5 10*3/uL (ref 0.7–4.0)
MCH: 29.9 pg (ref 26.0–34.0)
MCHC: 30.1 g/dL (ref 30.0–36.0)
MCV: 99.3 fL (ref 80.0–100.0)
Monocytes Absolute: 1.4 10*3/uL — ABNORMAL HIGH (ref 0.1–1.0)
Monocytes Relative: 11 %
Neutro Abs: 9 10*3/uL — ABNORMAL HIGH (ref 1.7–7.7)
Neutrophils Relative %: 72 %
Platelets: 342 10*3/uL (ref 150–400)
RBC: 2.81 MIL/uL — ABNORMAL LOW (ref 3.87–5.11)
RDW: 17.2 % — ABNORMAL HIGH (ref 11.5–15.5)
WBC: 12.4 10*3/uL — ABNORMAL HIGH (ref 4.0–10.5)
nRBC: 0 % (ref 0.0–0.2)

## 2019-02-05 LAB — COMPREHENSIVE METABOLIC PANEL
ALT: 20 U/L (ref 0–44)
AST: 26 U/L (ref 15–41)
Albumin: 2.4 g/dL — ABNORMAL LOW (ref 3.5–5.0)
Alkaline Phosphatase: 166 U/L — ABNORMAL HIGH (ref 38–126)
Anion gap: 8 (ref 5–15)
BUN: 57 mg/dL — ABNORMAL HIGH (ref 8–23)
CO2: 13 mmol/L — ABNORMAL LOW (ref 22–32)
Calcium: 7 mg/dL — ABNORMAL LOW (ref 8.9–10.3)
Chloride: 121 mmol/L — ABNORMAL HIGH (ref 98–111)
Creatinine, Ser: 3.17 mg/dL — ABNORMAL HIGH (ref 0.44–1.00)
GFR calc Af Amer: 16 mL/min — ABNORMAL LOW (ref >60)
GFR calc non Af Amer: 14 mL/min — ABNORMAL LOW (ref >60)
Glucose, Bld: 103 mg/dL — ABNORMAL HIGH (ref 70–99)
Potassium: 5.4 mmol/L — ABNORMAL HIGH (ref 3.5–5.1)
Sodium: 142 mmol/L (ref 135–145)
Total Bilirubin: 0.6 mg/dL (ref 0.3–1.2)
Total Protein: 7 g/dL (ref 6.5–8.1)

## 2019-02-05 LAB — TROPONIN I (HIGH SENSITIVITY): Troponin I (High Sensitivity): 59 ng/L — ABNORMAL HIGH (ref <18)

## 2019-02-05 IMAGING — CR DG CHEST 2V
2 series · 2 of 2 positions shown · non-contrast
Comparison: Chest x-ray dated [DATE].

CLINICAL DATA: Shortness of breath.

EXAM:
CHEST - 2 VIEW

[chest lat]
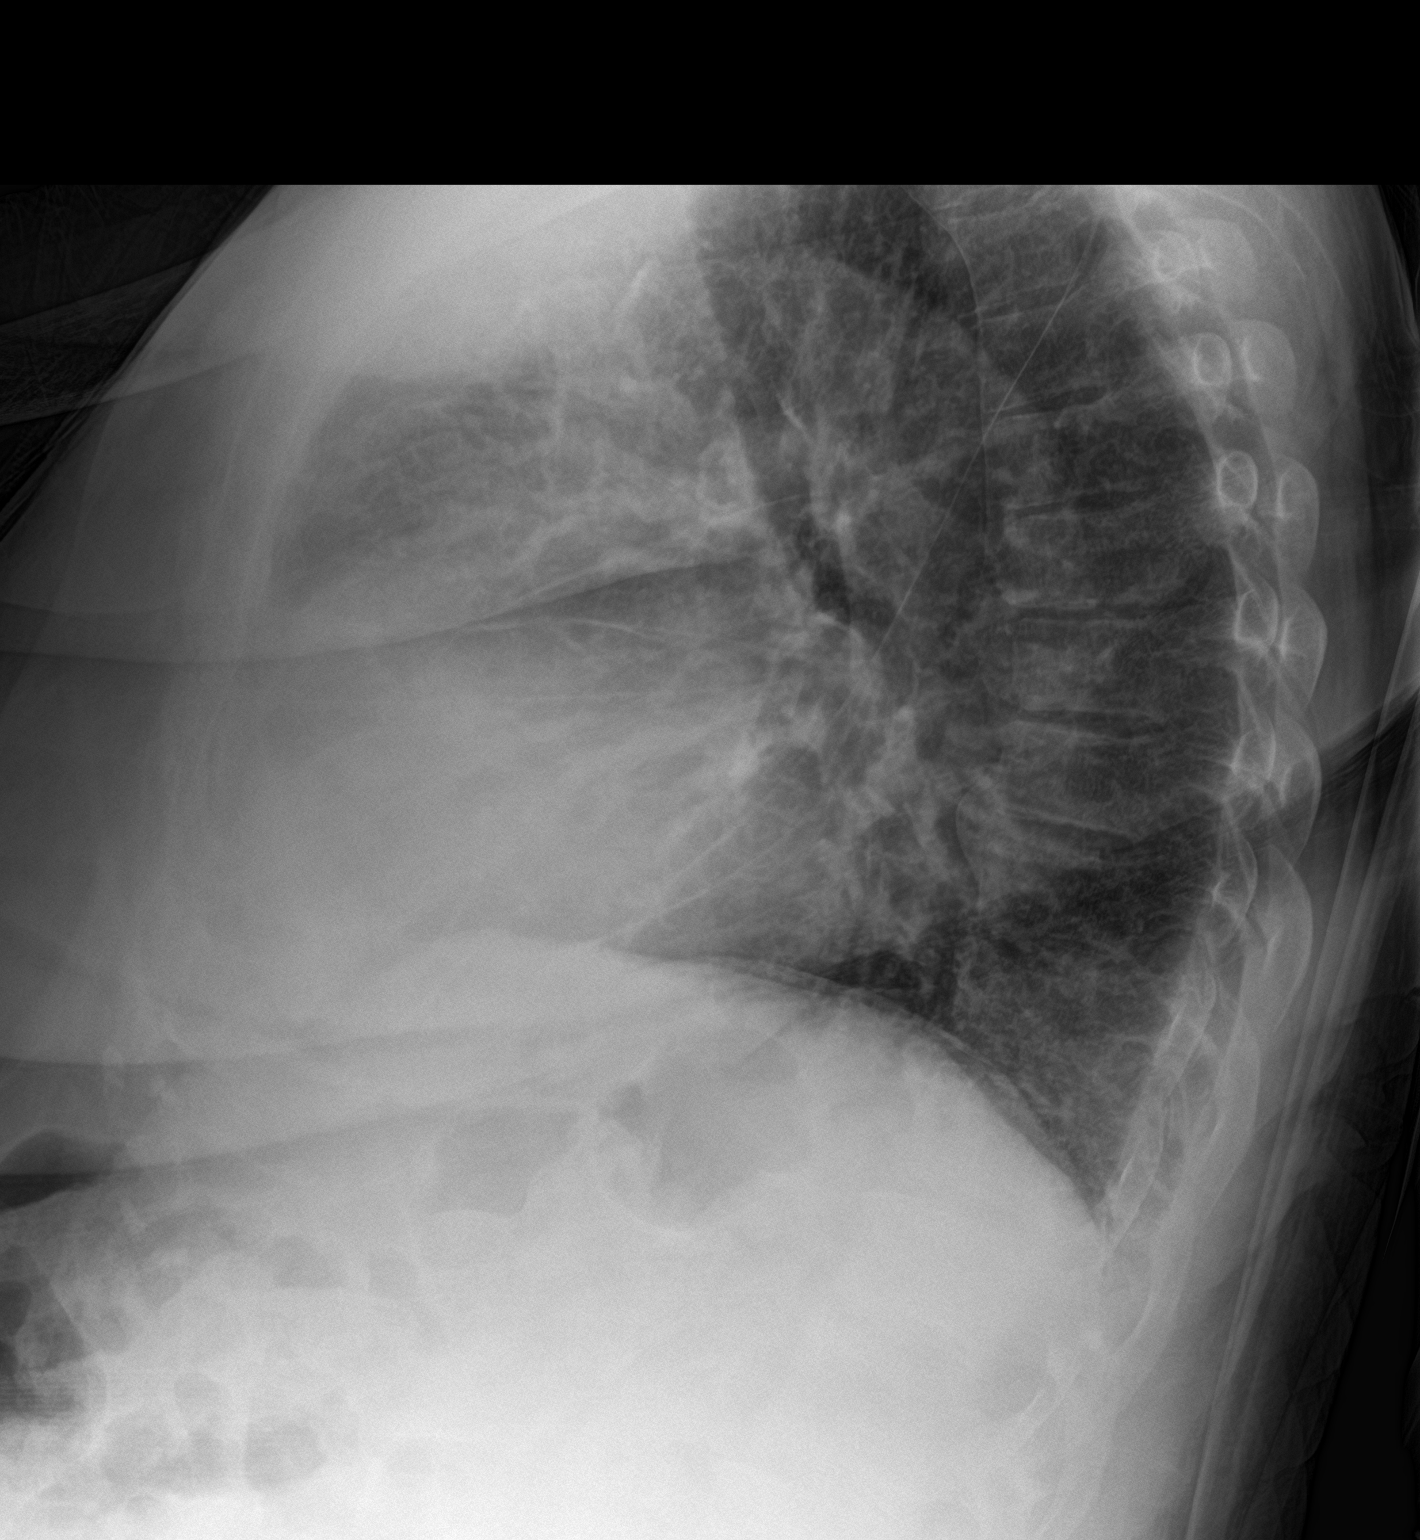

[chest ap]
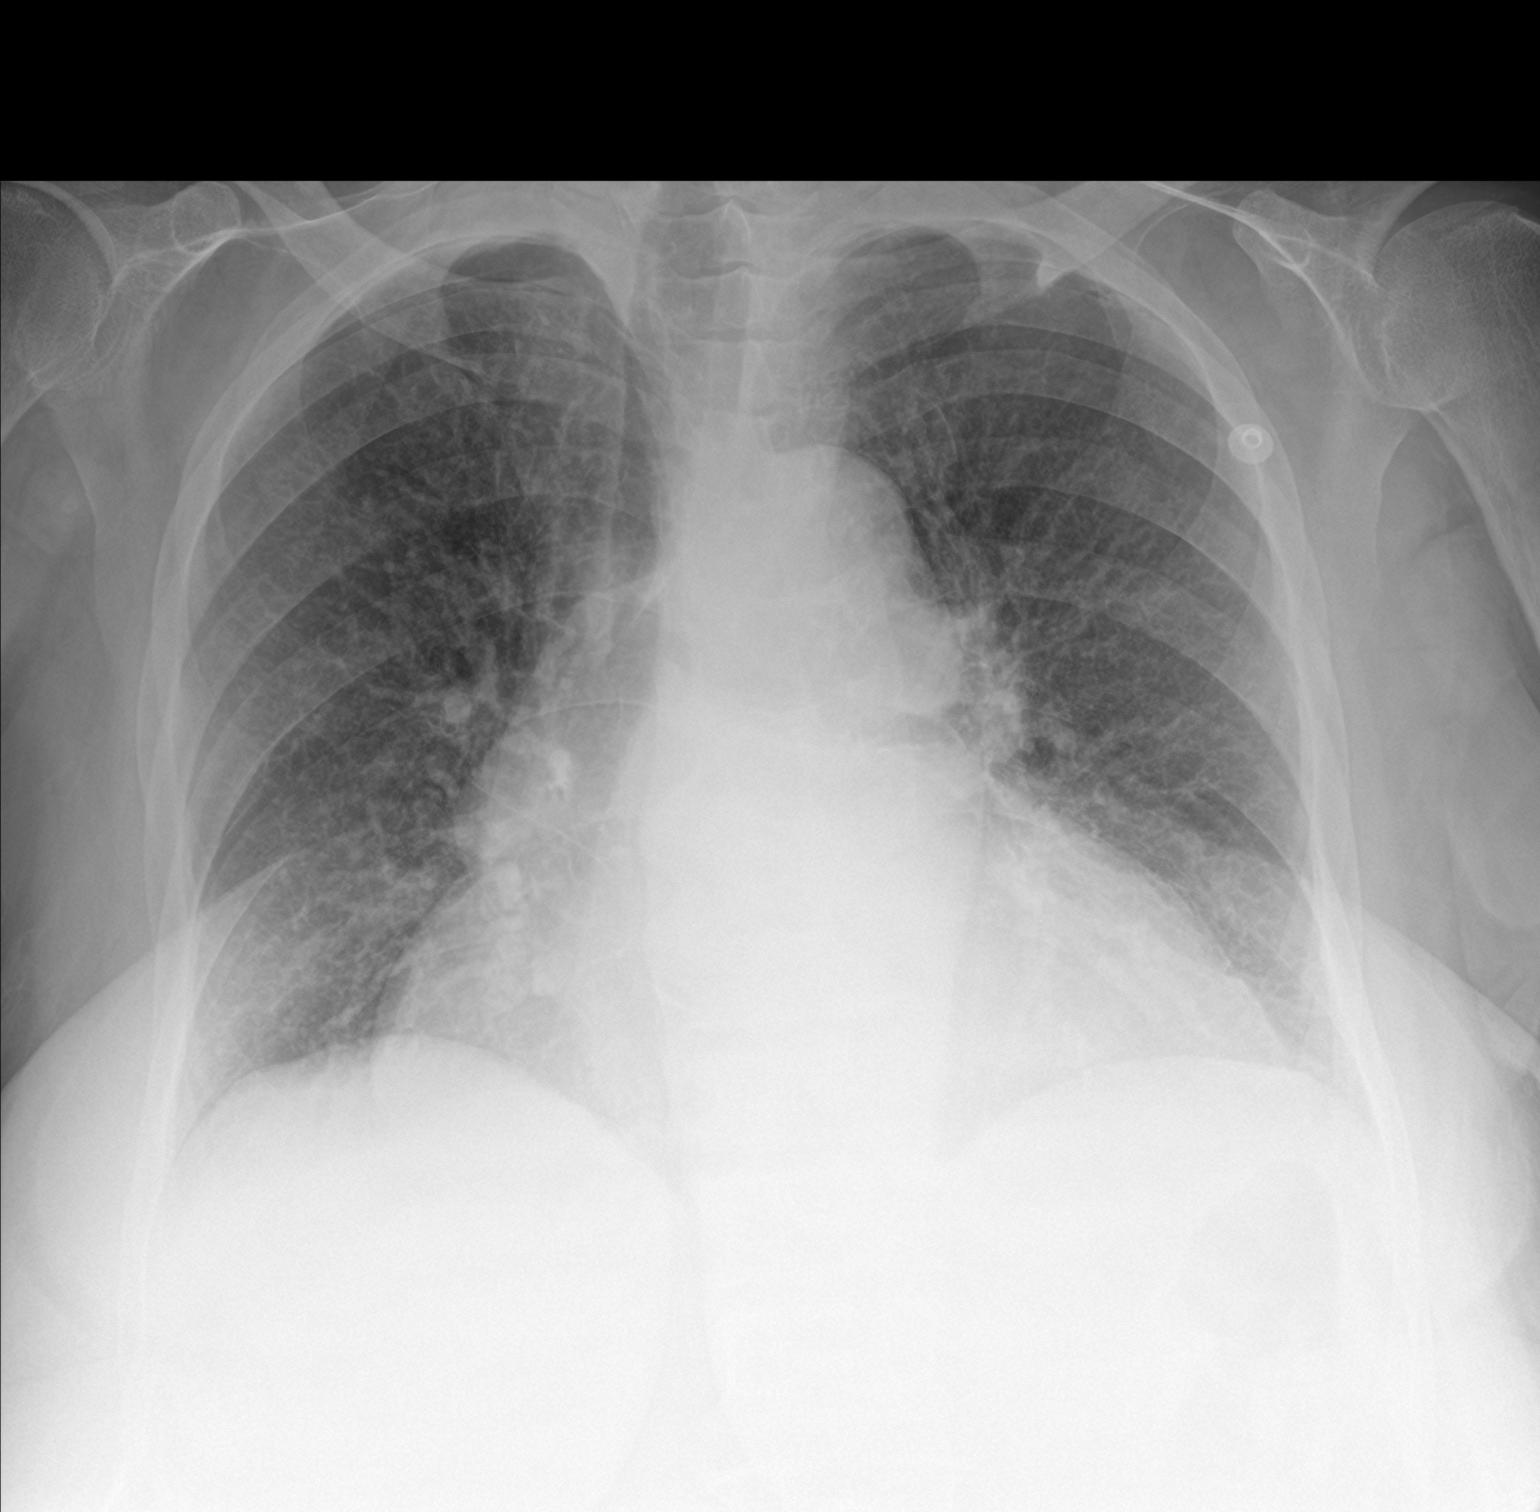

[2 of 2 positions shown; findings below may reference images not displayed]

FINDINGS: Unchanged cardiomegaly. Pulmonary vascular congestion. No focal
consolidation, pleural effusion, or pneumothorax. No acute osseous
abnormality.
IMPRESSION: Cardiomegaly and pulmonary vascular congestion without overt edema.

## 2019-02-05 NOTE — ED Notes (Addendum)
Attempted pt EKG x2, a lot of artifact due to pt breathing fast noted by Dr. Tomi Bamberger. Pt will need another EKG when pt is in a room.

## 2019-02-05 NOTE — ED Triage Notes (Signed)
Pt here from home with c/o sob and right leg swollen and painful pt is w/c bound at home , pt placed on 2 liters by ems for comfort

## 2019-02-06 ENCOUNTER — Observation Stay (HOSPITAL_COMMUNITY): Payer: Medicare Other

## 2019-02-06 ENCOUNTER — Other Ambulatory Visit: Payer: Self-pay

## 2019-02-06 ENCOUNTER — Encounter (HOSPITAL_COMMUNITY): Payer: Self-pay | Admitting: Family Medicine

## 2019-02-06 DIAGNOSIS — D638 Anemia in other chronic diseases classified elsewhere: Secondary | ICD-10-CM | POA: Diagnosis present

## 2019-02-06 DIAGNOSIS — I739 Peripheral vascular disease, unspecified: Secondary | ICD-10-CM | POA: Diagnosis present

## 2019-02-06 DIAGNOSIS — N179 Acute kidney failure, unspecified: Secondary | ICD-10-CM | POA: Diagnosis present

## 2019-02-06 DIAGNOSIS — I351 Nonrheumatic aortic (valve) insufficiency: Secondary | ICD-10-CM

## 2019-02-06 DIAGNOSIS — R778 Other specified abnormalities of plasma proteins: Secondary | ICD-10-CM | POA: Diagnosis present

## 2019-02-06 DIAGNOSIS — E872 Acidosis, unspecified: Secondary | ICD-10-CM | POA: Diagnosis present

## 2019-02-06 DIAGNOSIS — I361 Nonrheumatic tricuspid (valve) insufficiency: Secondary | ICD-10-CM | POA: Diagnosis not present

## 2019-02-06 DIAGNOSIS — M7989 Other specified soft tissue disorders: Secondary | ICD-10-CM | POA: Diagnosis not present

## 2019-02-06 DIAGNOSIS — I5033 Acute on chronic diastolic (congestive) heart failure: Secondary | ICD-10-CM | POA: Diagnosis present

## 2019-02-06 DIAGNOSIS — I471 Supraventricular tachycardia: Secondary | ICD-10-CM | POA: Diagnosis not present

## 2019-02-06 DIAGNOSIS — R0602 Shortness of breath: Secondary | ICD-10-CM | POA: Diagnosis not present

## 2019-02-06 DIAGNOSIS — E875 Hyperkalemia: Secondary | ICD-10-CM

## 2019-02-06 DIAGNOSIS — L97419 Non-pressure chronic ulcer of right heel and midfoot with unspecified severity: Secondary | ICD-10-CM | POA: Diagnosis present

## 2019-02-06 DIAGNOSIS — I272 Pulmonary hypertension, unspecified: Secondary | ICD-10-CM | POA: Diagnosis not present

## 2019-02-06 DIAGNOSIS — I5082 Biventricular heart failure: Secondary | ICD-10-CM | POA: Diagnosis present

## 2019-02-06 DIAGNOSIS — Z20828 Contact with and (suspected) exposure to other viral communicable diseases: Secondary | ICD-10-CM | POA: Diagnosis present

## 2019-02-06 DIAGNOSIS — D72829 Elevated white blood cell count, unspecified: Secondary | ICD-10-CM | POA: Diagnosis present

## 2019-02-06 DIAGNOSIS — I878 Other specified disorders of veins: Secondary | ICD-10-CM | POA: Diagnosis present

## 2019-02-06 DIAGNOSIS — E669 Obesity, unspecified: Secondary | ICD-10-CM | POA: Diagnosis present

## 2019-02-06 DIAGNOSIS — E785 Hyperlipidemia, unspecified: Secondary | ICD-10-CM | POA: Diagnosis present

## 2019-02-06 DIAGNOSIS — R7989 Other specified abnormal findings of blood chemistry: Secondary | ICD-10-CM | POA: Diagnosis not present

## 2019-02-06 DIAGNOSIS — D649 Anemia, unspecified: Secondary | ICD-10-CM

## 2019-02-06 DIAGNOSIS — I480 Paroxysmal atrial fibrillation: Secondary | ICD-10-CM | POA: Diagnosis present

## 2019-02-06 DIAGNOSIS — I10 Essential (primary) hypertension: Secondary | ICD-10-CM | POA: Diagnosis not present

## 2019-02-06 DIAGNOSIS — N184 Chronic kidney disease, stage 4 (severe): Secondary | ICD-10-CM | POA: Diagnosis present

## 2019-02-06 DIAGNOSIS — Z23 Encounter for immunization: Secondary | ICD-10-CM | POA: Diagnosis present

## 2019-02-06 DIAGNOSIS — I130 Hypertensive heart and chronic kidney disease with heart failure and stage 1 through stage 4 chronic kidney disease, or unspecified chronic kidney disease: Secondary | ICD-10-CM | POA: Diagnosis present

## 2019-02-06 DIAGNOSIS — J449 Chronic obstructive pulmonary disease, unspecified: Secondary | ICD-10-CM | POA: Diagnosis present

## 2019-02-06 DIAGNOSIS — I2721 Secondary pulmonary arterial hypertension: Secondary | ICD-10-CM | POA: Diagnosis present

## 2019-02-06 DIAGNOSIS — I252 Old myocardial infarction: Secondary | ICD-10-CM | POA: Diagnosis not present

## 2019-02-06 LAB — POCT I-STAT 7, (LYTES, BLD GAS, ICA,H+H)
Acid-base deficit: 13 mmol/L — ABNORMAL HIGH (ref 0.0–2.0)
Bicarbonate: 12.6 mmol/L — ABNORMAL LOW (ref 20.0–28.0)
Calcium, Ion: 1.1 mmol/L — ABNORMAL LOW (ref 1.15–1.40)
HCT: 22 % — ABNORMAL LOW (ref 36.0–46.0)
Hemoglobin: 7.5 g/dL — ABNORMAL LOW (ref 12.0–15.0)
O2 Saturation: 97 %
Patient temperature: 97.6
Potassium: 5 mmol/L (ref 3.5–5.1)
Sodium: 145 mmol/L (ref 135–145)
TCO2: 13 mmol/L — ABNORMAL LOW (ref 22–32)
pCO2 arterial: 28.3 mmHg — ABNORMAL LOW (ref 32.0–48.0)
pH, Arterial: 7.254 — ABNORMAL LOW (ref 7.350–7.450)
pO2, Arterial: 95 mmHg (ref 83.0–108.0)

## 2019-02-06 LAB — CBC WITH DIFFERENTIAL/PLATELET
Abs Immature Granulocytes: 0.14 10*3/uL — ABNORMAL HIGH (ref 0.00–0.07)
Basophils Absolute: 0.1 10*3/uL (ref 0.0–0.1)
Basophils Relative: 1 %
Eosinophils Absolute: 0.1 10*3/uL (ref 0.0–0.5)
Eosinophils Relative: 1 %
HCT: 26.9 % — ABNORMAL LOW (ref 36.0–46.0)
Hemoglobin: 8.1 g/dL — ABNORMAL LOW (ref 12.0–15.0)
Immature Granulocytes: 1 %
Lymphocytes Relative: 8 %
Lymphs Abs: 1 10*3/uL (ref 0.7–4.0)
MCH: 29.5 pg (ref 26.0–34.0)
MCHC: 30.1 g/dL (ref 30.0–36.0)
MCV: 97.8 fL (ref 80.0–100.0)
Monocytes Absolute: 1.1 10*3/uL — ABNORMAL HIGH (ref 0.1–1.0)
Monocytes Relative: 8 %
Neutro Abs: 10.7 10*3/uL — ABNORMAL HIGH (ref 1.7–7.7)
Neutrophils Relative %: 81 %
Platelets: 307 10*3/uL (ref 150–400)
RBC: 2.75 MIL/uL — ABNORMAL LOW (ref 3.87–5.11)
RDW: 17 % — ABNORMAL HIGH (ref 11.5–15.5)
WBC: 13.1 10*3/uL — ABNORMAL HIGH (ref 4.0–10.5)
nRBC: 0 % (ref 0.0–0.2)

## 2019-02-06 LAB — TROPONIN I (HIGH SENSITIVITY)
Troponin I (High Sensitivity): 59 ng/L — ABNORMAL HIGH (ref <18)
Troponin I (High Sensitivity): 49 ng/L — ABNORMAL HIGH (ref <18)

## 2019-02-06 LAB — ECHOCARDIOGRAM COMPLETE

## 2019-02-06 LAB — BRAIN NATRIURETIC PEPTIDE: B Natriuretic Peptide: 1007.7 pg/mL — ABNORMAL HIGH (ref 0.0–100.0)

## 2019-02-06 LAB — D-DIMER, QUANTITATIVE: D-Dimer, Quant: 2.9 ug/mL-FEU — ABNORMAL HIGH (ref 0.00–0.50)

## 2019-02-06 LAB — SARS CORONAVIRUS 2 (TAT 6-24 HRS): SARS Coronavirus 2: NEGATIVE

## 2019-02-06 LAB — MAGNESIUM: Magnesium: 1 mg/dL — ABNORMAL LOW (ref 1.7–2.4)

## 2019-02-06 MED ORDER — GABAPENTIN 100 MG PO CAPS
100.0000 mg | ORAL_CAPSULE | Freq: Two times a day (BID) | ORAL | Status: DC
  Administered 2019-02-06 – 2019-02-18 (×25): 100 mg via ORAL
  Filled 2019-02-06 (×25): qty 1

## 2019-02-06 MED ORDER — DIAZEPAM 2 MG PO TABS
2.0000 mg | ORAL_TABLET | Freq: Once | ORAL | Status: AC
  Administered 2019-02-06: 2 mg via ORAL
  Filled 2019-02-06: qty 1

## 2019-02-06 MED ORDER — FUROSEMIDE 10 MG/ML IJ SOLN
40.0000 mg | Freq: Two times a day (BID) | INTRAMUSCULAR | Status: DC
  Administered 2019-02-06 (×2): 40 mg via INTRAVENOUS
  Filled 2019-02-06 (×2): qty 4

## 2019-02-06 MED ORDER — SODIUM CHLORIDE 0.9 % IV SOLN
250.0000 mL | INTRAVENOUS | Status: DC | PRN
  Administered 2019-02-09 – 2019-02-12 (×4): 250 mL via INTRAVENOUS

## 2019-02-06 MED ORDER — INFLUENZA VAC A&B SA ADJ QUAD 0.5 ML IM PRSY
0.5000 mL | PREFILLED_SYRINGE | INTRAMUSCULAR | Status: AC
  Administered 2019-02-18: 0.5 mL via INTRAMUSCULAR
  Filled 2019-02-06: qty 0.5

## 2019-02-06 MED ORDER — SODIUM BICARBONATE 650 MG PO TABS
650.0000 mg | ORAL_TABLET | Freq: Three times a day (TID) | ORAL | Status: DC
  Administered 2019-02-06 – 2019-02-18 (×37): 650 mg via ORAL
  Filled 2019-02-06 (×38): qty 1

## 2019-02-06 MED ORDER — SENNOSIDES-DOCUSATE SODIUM 8.6-50 MG PO TABS: 2.0000 | ORAL_TABLET | Freq: Every evening | ORAL | Status: DC | PRN

## 2019-02-06 MED ORDER — POLYETHYLENE GLYCOL 3350 17 G PO PACK: 17.0000 g | PACK | Freq: Every day | ORAL | Status: DC | PRN

## 2019-02-06 MED ORDER — SODIUM CHLORIDE 0.9% FLUSH
3.0000 mL | Freq: Two times a day (BID) | INTRAVENOUS | Status: DC
  Administered 2019-02-06 – 2019-02-18 (×18): 3 mL via INTRAVENOUS

## 2019-02-06 MED ORDER — ZOLPIDEM TARTRATE 5 MG PO TABS
5.0000 mg | ORAL_TABLET | Freq: Every evening | ORAL | Status: DC | PRN
  Administered 2019-02-13: 5 mg via ORAL
  Filled 2019-02-06: qty 1

## 2019-02-06 MED ORDER — SODIUM CHLORIDE 0.9% FLUSH: 3.0000 mL | INTRAVENOUS | Status: DC | PRN

## 2019-02-06 MED ORDER — HYDRALAZINE HCL 25 MG PO TABS: 25.0000 mg | ORAL_TABLET | Freq: Four times a day (QID) | ORAL | Status: DC | PRN

## 2019-02-06 MED ORDER — HYDRALAZINE HCL 25 MG PO TABS: 50.0000 mg | ORAL_TABLET | Freq: Four times a day (QID) | ORAL | Status: DC | PRN

## 2019-02-06 MED ORDER — ACETAMINOPHEN 325 MG PO TABS
650.0000 mg | ORAL_TABLET | ORAL | Status: DC | PRN
  Administered 2019-02-07 – 2019-02-10 (×3): 650 mg via ORAL
  Filled 2019-02-06 (×3): qty 2

## 2019-02-06 MED ORDER — ASPIRIN 81 MG PO CHEW
81.0000 mg | CHEWABLE_TABLET | Freq: Every day | ORAL | Status: DC
  Administered 2019-02-06 – 2019-02-18 (×13): 81 mg via ORAL
  Filled 2019-02-06 (×13): qty 1

## 2019-02-06 MED ORDER — HEPARIN SODIUM (PORCINE) 5000 UNIT/ML IJ SOLN
5000.0000 [IU] | Freq: Three times a day (TID) | INTRAMUSCULAR | Status: DC
  Administered 2019-02-06 – 2019-02-13 (×21): 5000 [IU] via SUBCUTANEOUS
  Filled 2019-02-06 (×21): qty 1

## 2019-02-06 MED ORDER — METOPROLOL TARTRATE 50 MG PO TABS
50.0000 mg | ORAL_TABLET | Freq: Two times a day (BID) | ORAL | Status: DC
  Administered 2019-02-06 – 2019-02-18 (×25): 50 mg via ORAL
  Filled 2019-02-06 (×6): qty 1
  Filled 2019-02-06: qty 2
  Filled 2019-02-06 (×18): qty 1

## 2019-02-06 MED ORDER — FUROSEMIDE 10 MG/ML IJ SOLN
40.0000 mg | Freq: Once | INTRAMUSCULAR | Status: AC
  Administered 2019-02-06: 03:00:00 40 mg via INTRAVENOUS
  Filled 2019-02-06: qty 4

## 2019-02-06 MED ORDER — ONDANSETRON HCL 4 MG/2ML IJ SOLN
4.0000 mg | Freq: Four times a day (QID) | INTRAMUSCULAR | Status: DC | PRN
  Administered 2019-02-08: 10:00:00 4 mg via INTRAVENOUS
  Filled 2019-02-06: qty 2

## 2019-02-06 NOTE — Progress Notes (Signed)
  Echocardiogram 2D Echocardiogram has been performed.  Stephanie Griffith 02/06/2019, 11:06 AM

## 2019-02-06 NOTE — Plan of Care (Signed)
  Problem: Education: Goal: Ability to demonstrate management of disease process will improve Outcome: Progressing Goal: Ability to verbalize understanding of medication therapies will improve Outcome: Progressing Goal: Individualized Educational Video(s) Outcome: Progressing   Problem: Activity: Goal: Capacity to carry out activities will improve Outcome: Progressing   Problem: Cardiac: Goal: Ability to achieve and maintain adequate cardiopulmonary perfusion will improve Outcome: Progressing   Problem: Education: Goal: Knowledge of General Education information will improve Description: Including pain rating scale, medication(s)/side effects and non-pharmacologic comfort measures Outcome: Progressing   Problem: Clinical Measurements: Goal: Will remain free from infection Outcome: Progressing   Problem: Clinical Measurements: Goal: Diagnostic test results will improve Outcome: Progressing   Problem: Clinical Measurements: Goal: Respiratory complications will improve Outcome: Progressing   Problem: Clinical Measurements: Goal: Cardiovascular complication will be avoided Outcome: Progressing   Problem: Activity: Goal: Risk for activity intolerance will decrease Outcome: Progressing   Problem: Activity: Goal: Risk for activity intolerance will decrease Outcome: Progressing   Problem: Nutrition: Goal: Adequate nutrition will be maintained Outcome: Progressing   Problem: Nutrition: Goal: Adequate nutrition will be maintained Outcome: Progressing   Problem: Elimination: Goal: Will not experience complications related to bowel motility Outcome: Progressing Goal: Will not experience complications related to urinary retention Outcome: Progressing   Problem: Elimination: Goal: Will not experience complications related to bowel motility Outcome: Progressing   Problem: Elimination: Goal: Will not experience complications related to urinary retention Outcome:  Progressing   Problem: Coping: Goal: Level of anxiety will decrease Outcome: Progressing   Problem: Coping: Goal: Level of anxiety will decrease Outcome: Progressing   Problem: Elimination: Goal: Will not experience complications related to urinary retention Outcome: Progressing   Problem: Pain Managment: Goal: General experience of comfort will improve Outcome: Progressing   Problem: Safety: Goal: Ability to remain free from injury will improve Outcome: Progressing   Problem: Safety: Goal: Ability to remain free from injury will improve Outcome: Progressing   Problem: Skin Integrity: Goal: Risk for impaired skin integrity will decrease Outcome: Progressing   Problem: Skin Integrity: Goal: Risk for impaired skin integrity will decrease Outcome: Progressing

## 2019-02-06 NOTE — Plan of Care (Signed)
  Problem: Coping: Goal: Level of anxiety will decrease 02/06/2019 1200 by Arlina Robes, RN Outcome: Progressing   Problem: Elimination: Goal: Will not experience complications related to bowel motility 02/06/2019 1200 by Arlina Robes, RN Outcome: Progressing 02/06/2019 1157 by Arlina Robes, RN Outcome: Progressing Goal: Will not experience complications related to urinary retention 02/06/2019 1200 by Arlina Robes, RN Outcome: Progressing 02/06/2019 1157 by Arlina Robes, RN Outcome: Progressing   Problem: Pain Managment: Goal: General experience of comfort will improve 02/06/2019 1200 by Arlina Robes, RN Outcome: Progressing 02/06/2019 1157 by Arlina Robes, RN Outcome: Progressing   Problem: Safety: Goal: Ability to remain free from injury will improve 02/06/2019 1200 by Arlina Robes, RN Outcome: Progressing 02/06/2019 1157 by Arlina Robes, RN Outcome: Progressing   Problem: Pain Managment: Goal: General experience of comfort will improve 02/06/2019 1200 by Arlina Robes, RN Outcome: Progressing 02/06/2019 1157 by Arlina Robes, RN Outcome: Progressing   Problem: Safety: Goal: Ability to remain free from injury will improve 02/06/2019 1200 by Arlina Robes, RN Outcome: Progressing 02/06/2019 1157 by Arlina Robes, RN Outcome: Progressing   Problem: Skin Integrity: Goal: Risk for impaired skin integrity will decrease 02/06/2019 1200 by Arlina Robes, RN Outcome: Progressing 02/06/2019 1157 by Arlina Robes, RN Outcome: Progressing

## 2019-02-06 NOTE — ED Provider Notes (Signed)
[place] EMERGENCY DEPARTMENT Provider Note  CSN: [id number] Arrival date & time: 02/05/19 1757  Chief Complaint(s) No chief complaint on file.  HPI Stephanie Griffith is a 73 y.o. female   The history is provided by the patient.  Shortness of Breath Severity:  Moderate Onset quality:  Sudden Duration:  1 week Timing:  Intermittent Progression:  Waxing and waning Chronicity:  New Context: emotional upset (patient has been staying with one of her daughters who screams at the patient. When the patient get's upset, she feels the SOB.)   Relieved by:  Nothing Worsened by:  Nothing Associated symptoms: cough (dry)   Associated symptoms: no chest pain and no sputum production   Risk factors: prolonged immobilization   Risk factors: no hx of PE/DVT    Reports that she has had increased edema over last few weeks. Used to be on diuretic, but was taken off by her PCP.   Past Medical History Past Medical History:  Diagnosis Date   Chronic renal insufficiency    Cr 1.38-1.93 in 2009   Hypercholesteremia    Hyperglycemia    Hypertension    PVD (peripheral vascular disease) ([place])    s/p LAKA   Umbilical hernia    Patient Active Problem List   Diagnosis Date Noted   Pressure injury of skin 01/03/2016   Acute hypoxemic respiratory failure (HCC)    Acute pulmonary edema (HCC)    Hyperkalemia 12/30/2015   Incarcerated hernia 12/30/2015   Phantom limb pain ([place]) 08/13/2011   PVD (peripheral vascular disease) with claudication ([place]) 07/26/2011   S/P AKA (above knee amputation) ([place]) 06/15/2011   NSTEMI (non-ST elevated myocardial infarction) ([place]) [id number]   Acute diastolic heart failure (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 Authorizing Provider  amLODipine (NORVASC) 5 MG tablet Take 10 mg by mouth daily.  07/08/14   [provider]  aspirin 81 MG EC tablet Take 81 mg by mouth daily.      [provider]  atorvastatin (LIPITOR) 40 MG tablet Take 40 mg by mouth daily.    [provider]  famotidine (PEPCID) 20 MG tablet Take 20 mg by mouth 2 (two) times daily.    [provider]  gabapentin (NEURONTIN) 100 MG capsule Take 100 mg by mouth 2 (two) times daily.  06/23/14   [provider]  metoprolol (LOPRESSOR) 50 MG tablet Take 1 tablet (50 mg total) by mouth 2 (two) times daily. 01/10/16   Domenic Polite, MD  polyethylene glycol [facility] Floria Raveling) packet Take 17 g by mouth daily as needed for mild constipation. 01/10/16   Domenic Polite, MD  Past Surgical History Past Surgical History:  Procedure Laterality Date   AMPUTATION  06/06/2011   Procedure: AMPUTATION DIGIT;  Surgeon: Elam Dutch, MD;  Location: [facility] OR;  Service: Vascular;  Laterality: Left;  Transmetatarsal amputation left great toe   AMPUTATION  06/12/2011   Procedure: AMPUTATION ABOVE KNEE;  Surgeon: Elam Dutch, MD;  Location: [facility] OR;  Service: Vascular;  Laterality: Left;   FEMORAL-POPLITEAL BYPASS GRAFT  06/06/2011   Procedure: BYPASS GRAFT FEMORAL-POPLITEAL ARTERY;  Surgeon: Elam Dutch, MD;  Location: [place];  Service: Vascular;  Laterality: Left;  left femoral  to popliteal bypass with composite vein and propaten 12mm x 80 graft   LOWER EXTREMITY ANGIOGRAM N/A 06/04/2011   Procedure: LOWER EXTREMITY ANGIOGRAM;  Surgeon: Angelia Mould, MD;  [facility];  Service: Cardiovascular;  Laterality: N/A;   OMENTECTOMY  12/31/2015   Procedure: Partial OMENTECTOMY;  Surgeon: Georganna Skeans, MD;  Location: [place];  Service: General;;   TUBAL LIGATION     VENTRAL HERNIA REPAIR N/A 12/31/2015   Procedure: REPAIR INCARCERATED VENTRAL HERNIA, POSSIBLE BOWEL RESECTION;  Surgeon: Georganna Skeans, MD;  Location: [place];  Service: General;  Laterality: N/A;   Family History Family History  Problem Relation Age of Onset   Heart attack Father    Heart disease Father    Diabetes Mother    Hypertension Mother    Heart attack Brother     Social History Social History   Tobacco Use   Smoking status: Former Smoker    Packs/day: 2.00    Years: 50.00    Pack years: 100.00    Quit date: 06/01/2011    Years since quitting: 7.6   Smokeless tobacco: Never Used  Substance Use Topics   Alcohol use: No   Drug use: No   Allergies Morphine and related  Review of Systems Review of Systems  Respiratory: Positive for cough (dry) and shortness of breath. Negative for sputum production.   Cardiovascular: Negative for chest pain.   All other systems are reviewed and are negative for acute change except as noted in the HPI  Physical Exam Vital Signs  I have reviewed the triage vital signs BP (!) 152/70 (BP Location: Right Arm)    Pulse 89    Temp 97.6 F (36.4 C) (Oral)    Resp 18    SpO2 100%   Physical Exam Vitals signs reviewed.  Constitutional:      General: She is not in acute distress.    Appearance: She is well-developed. She is not diaphoretic.     Comments: Becomes tachycardic to 140s when discussing her daughter. At this time, she complains of SOB. When calm, her tachycardia and SOB resolves.  HENT:     Head: Normocephalic and  atraumatic.     Nose: Nose normal.  Eyes:     General: No scleral icterus.       Right eye: No discharge.        Left eye: No discharge.     Conjunctiva/sclera: Conjunctivae normal.     Pupils: Pupils are equal, round, and reactive to light.  Neck:     Musculoskeletal: Normal range of motion and neck supple.     Vascular: JVD present.  Cardiovascular:     Rate and Rhythm: Normal rate and regular rhythm.     Heart sounds: No murmur. No friction rub. No gallop.   Pulmonary:     Effort: Pulmonary effort is normal. Tachypnea present.  Breath sounds: No stridor. No wheezing, rhonchi or rales.     Comments: Labored breathing with speaking Abdominal:     General: There is no distension.     Palpations: Abdomen is soft.     Tenderness: There is no abdominal tenderness.  Musculoskeletal:        General: No tenderness.       Legs:  Skin:    General: Skin is warm and dry.     Findings: No erythema or rash.  Neurological:     Mental Status: She is alert and oriented to person, place, and time.  Psychiatric:        Mood and Affect: Mood is anxious.     ED Results and Treatments Labs (all labs ordered are listed, but only abnormal results are displayed) Labs Reviewed  COMPREHENSIVE METABOLIC PANEL - Abnormal; Notable for the following components:      Result Value   Potassium 5.4 (*)    Chloride 121 (*)    CO2 13 (*)    Glucose, Bld 103 (*)    BUN 57 (*)    Creatinine, Ser 3.17 (*)    Calcium 7.0 (*)    Albumin 2.4 (*)    Alkaline Phosphatase 166 (*)    GFR calc non Af Amer 14 (*)    GFR calc Af Amer 16 (*)    All other components within normal limits  CBC WITH DIFFERENTIAL/PLATELET - Abnormal; Notable for the following components:   WBC 12.4 (*)    RBC 2.81 (*)    Hemoglobin 8.4 (*)    HCT 27.9 (*)    RDW 17.2 (*)    Neutro Abs 9.0 (*)    Monocytes Absolute 1.4 (*)    Abs Immature Granulocytes 0.16 (*)    All other components within normal limits  D-DIMER,  QUANTITATIVE (NOT AT [facility]) - Abnormal; Notable for the following components:   D-Dimer, Quant 2.90 (*)    All other components within normal limits  BRAIN NATRIURETIC PEPTIDE - Abnormal; Notable for the following components:   B Natriuretic Peptide 1,007.7 (*)    All other components within normal limits  POCT I-STAT 7, (LYTES, BLD GAS, ICA,H+H) - Abnormal; Notable for the following components:   pH, Arterial 7.254 (*)    pCO2 arterial 28.3 (*)    Bicarbonate 12.6 (*)    TCO2 13 (*)    Acid-base deficit 13.0 (*)    Calcium, Ion 1.10 (*)    HCT 22.0 (*)    Hemoglobin 7.5 (*)    All other components within normal limits  TROPONIN I (HIGH SENSITIVITY) - Abnormal; Notable for the following components:   Troponin I (High Sensitivity) 59 (*)    All other components within normal limits  SARS CORONAVIRUS 2 (TAT 6-24 HRS)  BLOOD GAS, ARTERIAL  TROPONIN I (HIGH SENSITIVITY)  TROPONIN I (HIGH SENSITIVITY)  TROPONIN I (HIGH SENSITIVITY)  EKG  EKG Interpretation  Date/Time:  Thursday February 05 2019 18:25:10 EST Ventricular Rate:  87 PR Interval:  156 QRS Duration: 66 QT Interval:  362 QTC Calculation: 435 R Axis:   62 Text Interpretation: Normal sinus rhythm Low voltage QRS Cannot rule out Anterior infarct , age undetermined Abnormal ECG Artifact NO STEMI. Confirmed by Addison Lank [phone]) on 02/06/2019 1:24:54 AM      Radiology Dg Chest 2 View  Result Date: 02/05/2019 CLINICAL DATA:  Shortness of breath. EXAM: CHEST - 2 VIEW COMPARISON:  Chest x-ray dated January 08, 2016. FINDINGS: Unchanged cardiomegaly. Pulmonary vascular congestion. No focal consolidation, pleural effusion, or pneumothorax. No acute osseous abnormality. IMPRESSION: Cardiomegaly and pulmonary vascular congestion without overt edema. Electronically Signed   By: Titus Dubin M.D.   On: 02/05/2019  19:33    Pertinent labs & imaging results that were available during my care of the patient were reviewed by me and considered in my medical decision making (see chart for details).  Medications Ordered in ED Medications  furosemide (LASIX) injection 40 mg (40 mg Intravenous Given 02/06/19 0305)  diazepam (VALIUM) tablet 2 mg (2 mg Oral Given 02/06/19 0304)                                                                                                                                    Procedures .Critical Care Performed by: Fatima Blank, MD Authorized by: Fatima Blank, MD     CRITICAL CARE Performed by: Grayce Sessions Ronald Vinsant Total critical care time: 40 minutes Critical care time was exclusive of separately billable procedures and treating other patients. Critical care was necessary to treat or prevent imminent or life-threatening deterioration. Critical care was time spent personally by me on the following activities: development of treatment plan with patient and/or surrogate as well as nursing, discussions with consultants, evaluation of patient's response to treatment, examination of patient, obtaining history from patient or surrogate, ordering and performing treatments and interventions, ordering and review of laboratory studies, ordering and review of radiographic studies, pulse oximetry and re-evaluation of patient's condition.   (including critical care time)  Medical Decision Making / ED Course I have reviewed the nursing notes for this encounter and the patient's prior records (if available in EHR or on provided paperwork).   Stephanie Griffith was evaluated in Emergency Department on 02/06/2019 for the symptoms described in the history of present illness. She was evaluated in the context of the global COVID-19 pandemic, which necessitated consideration that the patient might be at risk for infection with the SARS-CoV-2 virus that causes COVID-19. Institutional  protocols and algorithms that pertain to the evaluation of patients at risk for COVID-19 are in a state of rapid change based on information released by regulatory bodies including the CDC and federal and state organizations. These policies and algorithms were followed during the patient's care in the ED.  Patient presents with 1  week of shortness of breath and increasing peripheral edema. She is afebrile with stable vital signs.  Tachypneic on exam and becomes short of breath with speaking.  Placed on 2 L nasal cannula for comfort. Chest x-ray with cardiomegaly and pulmonary vascular congestion without overt pulmonary edema.  Exam is suspicious for CHF exacerbation.  BNP greater than 1000 with elevated trop. Provided with IV Lasix. EKG w/o acute ischemia.  Given the patient's immobility, also suspects pulmonary embolism given the patient's reporting of sudden onset shortness of breath.  Dimer elevated.  Patient has renal insufficiency and unable to obtain CTA.  Will require VQ scan.  There does appear to be an anxiety component related to some of her symptoms.  Provided with oral Valium.  Rest of the labs notable for nonanion gap metabolic acidosis likely from CKD.  Patient's tachypnea resulting in decreased PCO2.  Will admit for further work up and management.      Final Clinical Impression(s) / ED Diagnoses Final diagnoses:  Acute on chronic diastolic congestive heart failure (HCC)  Elevated troponin  Metabolic acidosis  AKI (acute kidney injury) ([place])      This chart was dictated using voice recognition software.  Despite best efforts to proofread,  errors can occur which can change the documentation meaning.   Fatima Blank, MD 02/06/19 [phone]

## 2019-02-06 NOTE — Progress Notes (Signed)
Admitted this morning by Dr Myna Hidalgo for CHF exac and AKI on CKD IV.  Currently getting IV diuretics while monitoring renal function. Echo has been ordered.   Will monitor clinically and adjust plan as needed.   Call with questions.   Gerlean Ren MD [facility]

## 2019-02-06 NOTE — H&P (Signed)
History and Physical    Stephanie Griffith [id number] DOB: 1945/04/16 DOA: 02/05/2019  PCP: [name], MD   Patient coming from: Home   Chief Complaint: Leg swelling, SOB   HPI: Stephanie Griffith is a 73 y.o. female with medical history significant for chronic kidney disease stage IV, peripheral vascular disease status post left AKA, hypertension, chronic anemia, and chronic diastolic CHF, now presenting to the emergency department with 1 week of progressive leg swelling and shortness of breath.  Patient reports chronic leg swelling that has worsened insidiously, but is more concerned about progressive dyspnea that developed over the past week without cough, fevers, or chest pain.  Breathing is much worse if she attempts to lay down on her back and is better when sitting up.  She denies any chest pain, hemoptysis, leg tenderness, or history of VTE.   ED Course: Upon arrival to the ED, patient is found to be afebrile, saturating 100% on 2 L/min of supplemental oxygen, tachypneic in the mid 20s, and with stable blood pressure.  EKG features a sinus rhythm and chest x-ray is notable for cardiomegaly with pulmonary vascular congestion.  Chemistry panel features a potassium of 5.4, bicarbonate 13, BUN 57, and creatinine 3.17, up from 2.06 in February 2019.  CBC is notable for leukocytosis to 12,400 and a normocytic anemia with hemoglobin 8.4.  Troponin is 59 and BNP 1008.  Patient was treated with 40 mg IV Lasix and 2 mg oral Valium in the emergency department.  COVID-19 testing not yet resulted.  Review of Systems:  All other systems reviewed and apart from HPI, are negative.  Past Medical History:  Diagnosis Date  . Chronic renal insufficiency    Cr 1.38-1.93 in 2009  . Hypercholesteremia   . Hyperglycemia   . Hypertension   . PVD (peripheral vascular disease) ([place])    s/p LAKA  . Umbilical hernia     Past Surgical History:  Procedure Laterality Date  . AMPUTATION  06/06/2011   Procedure:  AMPUTATION DIGIT;  Surgeon: Elam Dutch, MD;  Location: [facility] OR;  Service: Vascular;  Laterality: Left;  Transmetatarsal amputation left great toe  . AMPUTATION  06/12/2011   Procedure: AMPUTATION ABOVE KNEE;  Surgeon: Elam Dutch, MD;  Location: [place];  Service: Vascular;  Laterality: Left;  . FEMORAL-POPLITEAL BYPASS GRAFT  06/06/2011   Procedure: BYPASS GRAFT FEMORAL-POPLITEAL ARTERY;  Surgeon: Elam Dutch, MD;  Location: [facility] OR;  Service: Vascular;  Laterality: Left;  left femoral to popliteal bypass with composite vein and propaten 37mm x 80 graft  . LOWER EXTREMITY ANGIOGRAM N/A 06/04/2011   Procedure: LOWER EXTREMITY ANGIOGRAM;  Surgeon: Angelia Mould, MD;  Location: [name] CATH LAB;  Service: Cardiovascular;  Laterality: N/A;  . OMENTECTOMY  12/31/2015   Procedure: Partial OMENTECTOMY;  Surgeon: Georganna Skeans, MD;  Location: [place];  Service: General;;  . TUBAL LIGATION    . VENTRAL HERNIA REPAIR N/A 12/31/2015   Procedure: REPAIR INCARCERATED VENTRAL HERNIA, POSSIBLE BOWEL RESECTION;  Surgeon: Georganna Skeans, MD;  Location: [place];  Service: General;  Laterality: N/A;     reports that she quit smoking about 7 years ago. She has a 100.00 pack-year smoking history. She has never used smokeless tobacco. She reports that she does not drink alcohol or use drugs.  Allergies  Allergen Reactions  . Morphine And Related     AMS    Family History  Problem Relation Age of Onset  . Heart attack Father   .  Heart disease Father   . Diabetes Mother   . Hypertension Mother   . Heart attack Brother      Prior to Admission medications   Medication Sig Start Date End Date Taking? Authorizing Provider  amLODipine (NORVASC) 5 MG tablet Take 10 mg by mouth daily.  07/08/14   [provider]  aspirin 81 MG EC tablet Take 81 mg by mouth daily.      [provider]  atorvastatin (LIPITOR) 40 MG tablet Take 40 mg by mouth daily.    [provider]  famotidine  (PEPCID) 20 MG tablet Take 20 mg by mouth 2 (two) times daily.    [provider]  gabapentin (NEURONTIN) 100 MG capsule Take 100 mg by mouth 2 (two) times daily.  06/23/14   [provider]  metoprolol (LOPRESSOR) 50 MG tablet Take 1 tablet (50 mg total) by mouth 2 (two) times daily. 01/10/16   Domenic Polite, MD  polyethylene glycol [facility] / Floria Raveling) packet Take 17 g by mouth daily as needed for mild constipation. 01/10/16   Domenic Polite, MD    Physical Exam: Vitals:   02/06/19 0200 02/06/19 0245 02/06/19 0300 02/06/19 0400  BP: (!) 163/69 (!) 171/74 (!) 164/72 (!) 171/75  Pulse: 92 95 92 94  Resp: (!) 26 (!) 25 20 (!) 22  Temp:      TempSrc:      SpO2: 100% 100% 100% 100%    Constitutional: NAD, calm  Eyes: PERTLA, lids and conjunctivae normal ENMT: Mucous membranes are moist. Posterior pharynx clear of any exudate or lesions.   Neck: normal, supple, no masses, no thyromegaly Respiratory: Mild tachypnea, speaking full sentances, no wheezing. No pallor or cyanosis.  Cardiovascular: S1 & S2 heard, regular rate and rhythm. Pretibial pitting edema. Abdomen: No distension, no tenderness, soft. Bowel sounds active.  Musculoskeletal: no clubbing / cyanosis. Status-post left AKA.  Skin: Lower leg hyperpigmentation and verrucous hyperplasia. Warm, dry, well-perfused. Neurologic: No facial asymmetry. No dysarthria or aphasia. Sensation intact. Moving all extremities.  Psychiatric:  Alert and oriented to person, place, and situation. Very pleasant, cooperative.     Labs on Admission: I have personally reviewed following labs and imaging studies  CBC: Recent Labs  Lab 02/05/19 1805 02/06/19 0312  WBC 12.4*  --   NEUTROABS 9.0*  --   HGB 8.4* 7.5*  HCT 27.9* 22.0*  MCV 99.3  --   PLT 342  --    Basic Metabolic Panel: Recent Labs  Lab 02/05/19 1805 02/06/19 0312  NA 142 145  K 5.4* 5.0  CL 121*  --   CO2 13*  --   GLUCOSE 103*  --   BUN 57*  --    CREATININE 3.17*  --   CALCIUM 7.0*  --    GFR: CrCl cannot be calculated (Unknown ideal weight.). Liver Function Tests: Recent Labs  Lab 02/05/19 1805  AST 26  ALT 20  ALKPHOS 166*  BILITOT 0.6  PROT 7.0  ALBUMIN 2.4*   No results for input(s): LIPASE, AMYLASE in the last 168 hours. No results for input(s): AMMONIA in the last 168 hours. Coagulation Profile: No results for input(s): INR, PROTIME in the last 168 hours. Cardiac Enzymes: No results for input(s): CKTOTAL, CKMB, CKMBINDEX, TROPONINI in the last 168 hours. BNP (last 3 results) No results for input(s): PROBNP in the last 8760 hours. HbA1C: No results for input(s): HGBA1C in the last 72 hours. CBG: No results for input(s): GLUCAP in the  last 168 hours. Lipid Profile: No results for input(s): CHOL, HDL, LDLCALC, TRIG, CHOLHDL, LDLDIRECT in the last 72 hours. Thyroid Function Tests: No results for input(s): TSH, T4TOTAL, FREET4, T3FREE, THYROIDAB in the last 72 hours. Anemia Panel: No results for input(s): VITAMINB12, FOLATE, FERRITIN, TIBC, IRON, RETICCTPCT in the last 72 hours. Urine analysis:    Component Value Date/Time   COLORURINE YELLOW 12/29/2015 2222   APPEARANCEUR CLEAR 12/29/2015 2222   LABSPEC 1.020 12/29/2015 2222   PHURINE 6.5 12/29/2015 2222   GLUCOSEU NEGATIVE 12/29/2015 2222   HGBUR TRACE (A) 12/29/2015 2222   BILIRUBINUR NEGATIVE 12/29/2015 2222   KETONESUR NEGATIVE 12/29/2015 2222   PROTEINUR 30 (A) 12/29/2015 2222   UROBILINOGEN 0.2 06/18/2011 1751   NITRITE NEGATIVE 12/29/2015 2222   LEUKOCYTESUR TRACE (A) 12/29/2015 2222   Sepsis Labs: @LABRCNTIP (procalcitonin:4,lacticidven:4) )No results found for this or any previous visit (from the past 240 hour(s)).   Radiological Exams on Admission: Dg Chest 2 View  Result Date: 02/05/2019 CLINICAL DATA:  Shortness of breath. EXAM: CHEST - 2 VIEW COMPARISON:  Chest x-ray dated January 08, 2016. FINDINGS: Unchanged cardiomegaly. Pulmonary  vascular congestion. No focal consolidation, pleural effusion, or pneumothorax. No acute osseous abnormality. IMPRESSION: Cardiomegaly and pulmonary vascular congestion without overt edema. Electronically Signed   By: Titus Dubin M.D.   On: 02/05/2019 19:33    EKG: Independently reviewed. Sinus rhythm, rate 92, QTc 430 ms.   Assessment/Plan  1. Acute on chronic diastolic CHF   - Presents with increase in chronic leg swelling and 1 wk of progressive SOB and orthopnea  - She is found to have peripheral edema, cardiomegaly and vascular congestion on CXR, and BNP 1008  - EF was preserved on echo from 2017  - She was treated with Lasix 40 mg IV in ED  - Continue diuresis with Lasix 40 mg IV (adjust dose based on response), update echocardiogram, follow daily wt and I/O's    2. CKD stage IV; metabolic acidosis; hyperkalemia  - SCr is 3.17 on admission, up from 2.06 in February 2020 with no more recent values available  - Serum potassium is 5.4 and bicarb 13  - She is hypervolemic and treated with Lasix 40 mg IV in ED  - Renally-dose medications, start bicarbonate, continue diuresis, monitor renal function and electrolytes with daily BMP during diuresis    3. Hypertension  - BP mildly elevated, anticipate improvement with diuresis  - Pharmacy med-rec pending, plan to use hydralazine as needed for now   4. Anemia  - Hgb is 8.4 on admission  - Patient denies melena or hematochezia, Hgb similar to priors, likely secondary to CKD    5. Elevated d-dimer  - D-dimer is 2.90 in ED  - There is no chest pain, leg tenderness, hemoptysis, or hx of VTE  - Her SOB is explained by acute CHF and CTA chest precluded by renal insufficiency  - Check LE venous doppler, consider VQ scan if DVT is found or if SOB fails to improve as expected with diuresis     PPE: Mask, face shield  DVT prophylaxis: sq heparin  Code Status: Full  Family Communication: Discussed with patient  Consults called: None   Admission status: Observation     Vianne Bulls, MD [facility] Pager [phone]  If 7PM-7AM, please contact night-coverage [email or web address] Password [facility]  02/06/2019, 4:23 AM

## 2019-02-07 ENCOUNTER — Inpatient Hospital Stay (HOSPITAL_COMMUNITY): Payer: Medicare Other

## 2019-02-07 ENCOUNTER — Encounter (HOSPITAL_COMMUNITY): Payer: Self-pay | Admitting: Physician Assistant

## 2019-02-07 DIAGNOSIS — R0602 Shortness of breath: Secondary | ICD-10-CM

## 2019-02-07 DIAGNOSIS — R7989 Other specified abnormal findings of blood chemistry: Secondary | ICD-10-CM

## 2019-02-07 DIAGNOSIS — I5033 Acute on chronic diastolic (congestive) heart failure: Secondary | ICD-10-CM

## 2019-02-07 DIAGNOSIS — I272 Pulmonary hypertension, unspecified: Secondary | ICD-10-CM

## 2019-02-07 DIAGNOSIS — M7989 Other specified soft tissue disorders: Secondary | ICD-10-CM

## 2019-02-07 LAB — IRON AND TIBC
Iron: 29 ug/dL (ref 28–170)
Saturation Ratios: 19 % (ref 10.4–31.8)
TIBC: 151 ug/dL — ABNORMAL LOW (ref 250–450)
UIBC: 122 ug/dL

## 2019-02-07 LAB — BASIC METABOLIC PANEL
Anion gap: 9 (ref 5–15)
BUN: 64 mg/dL — ABNORMAL HIGH (ref 8–23)
CO2: 13 mmol/L — ABNORMAL LOW (ref 22–32)
Calcium: 6.9 mg/dL — ABNORMAL LOW (ref 8.9–10.3)
Chloride: 123 mmol/L — ABNORMAL HIGH (ref 98–111)
Creatinine, Ser: 3.36 mg/dL — ABNORMAL HIGH (ref 0.44–1.00)
GFR calc Af Amer: 15 mL/min — ABNORMAL LOW (ref >60)
GFR calc non Af Amer: 13 mL/min — ABNORMAL LOW (ref >60)
Glucose, Bld: 73 mg/dL (ref 70–99)
Potassium: 5 mmol/L (ref 3.5–5.1)
Sodium: 145 mmol/L (ref 135–145)

## 2019-02-07 LAB — RETICULOCYTES
Immature Retic Fract: 16.8 % — ABNORMAL HIGH (ref 2.3–15.9)
RBC.: 2.39 MIL/uL — ABNORMAL LOW (ref 3.87–5.11)
Retic Count, Absolute: 103.2 10*3/uL (ref 19.0–186.0)
Retic Ct Pct: 4.3 % — ABNORMAL HIGH (ref 0.4–3.1)

## 2019-02-07 LAB — CBC
HCT: 25.1 % — ABNORMAL LOW (ref 36.0–46.0)
Hemoglobin: 7.5 g/dL — ABNORMAL LOW (ref 12.0–15.0)
MCH: 29.1 pg (ref 26.0–34.0)
MCHC: 29.9 g/dL — ABNORMAL LOW (ref 30.0–36.0)
MCV: 97.3 fL (ref 80.0–100.0)
Platelets: 293 10*3/uL (ref 150–400)
RBC: 2.58 MIL/uL — ABNORMAL LOW (ref 3.87–5.11)
RDW: 17.1 % — ABNORMAL HIGH (ref 11.5–15.5)
WBC: 10 10*3/uL (ref 4.0–10.5)
nRBC: 0 % (ref 0.0–0.2)

## 2019-02-07 LAB — URINALYSIS, ROUTINE W REFLEX MICROSCOPIC
Bilirubin Urine: NEGATIVE
Glucose, UA: NEGATIVE mg/dL
Ketones, ur: NEGATIVE mg/dL
Nitrite: NEGATIVE
Protein, ur: 30 mg/dL — AB
Specific Gravity, Urine: 1.008 (ref 1.005–1.030)
pH: 6 (ref 5.0–8.0)

## 2019-02-07 LAB — FOLATE: Folate: 14.1 ng/mL (ref >5.9)

## 2019-02-07 LAB — SODIUM, URINE, RANDOM: Sodium, Ur: 132 mmol/L

## 2019-02-07 LAB — VITAMIN B12: Vitamin B-12: 164 pg/mL — ABNORMAL LOW (ref 180–914)

## 2019-02-07 LAB — TSH: TSH: 1.91 u[IU]/mL (ref 0.350–4.500)

## 2019-02-07 LAB — CREATININE, URINE, RANDOM: Creatinine, Urine: 27.5 mg/dL

## 2019-02-07 LAB — FERRITIN: Ferritin: 75 ng/mL (ref 11–307)

## 2019-02-07 LAB — MAGNESIUM: Magnesium: 1 mg/dL — ABNORMAL LOW (ref 1.7–2.4)

## 2019-02-07 MED ORDER — ORAL CARE MOUTH RINSE
15.0000 mL | Freq: Two times a day (BID) | OROMUCOSAL | Status: DC
  Administered 2019-02-07 – 2019-02-18 (×17): 15 mL via OROMUCOSAL

## 2019-02-07 MED ORDER — MAGNESIUM SULFATE 2 GM/50ML IV SOLN
2.0000 g | Freq: Once | INTRAVENOUS | Status: AC
  Administered 2019-02-07: 10:00:00 2 g via INTRAVENOUS
  Filled 2019-02-07: qty 50

## 2019-02-07 MED ORDER — FUROSEMIDE 10 MG/ML IJ SOLN
80.0000 mg | Freq: Two times a day (BID) | INTRAMUSCULAR | Status: DC
  Administered 2019-02-07: 80 mg via INTRAVENOUS
  Filled 2019-02-07: qty 8

## 2019-02-07 MED ORDER — FUROSEMIDE 10 MG/ML IJ SOLN
40.0000 mg | Freq: Once | INTRAMUSCULAR | Status: DC
  Filled 2019-02-07: qty 4

## 2019-02-07 MED ORDER — MAGNESIUM OXIDE 400 (241.3 MG) MG PO TABS
800.0000 mg | ORAL_TABLET | Freq: Two times a day (BID) | ORAL | Status: AC
  Administered 2019-02-07 (×2): 800 mg via ORAL
  Filled 2019-02-07 (×2): qty 2

## 2019-02-07 MED ORDER — FUROSEMIDE 10 MG/ML IJ SOLN
40.0000 mg | Freq: Once | INTRAMUSCULAR | Status: AC
  Administered 2019-02-07: 14:00:00 40 mg via INTRAVENOUS
  Filled 2019-02-07: qty 4

## 2019-02-07 NOTE — Progress Notes (Addendum)
Requested incentive spir from RT at 1437.  Paged Amin at [phone]. EKG complete, held 1430 40mg  one time Lasix since lost IV access.Weaned to 2L on [place];99%.   Informed cardio Branch MD via face to face, held 40 mg Lasix due to lost IV access. ok  Paged Amin at [facility]. Pt. 357ml urine output & 2 urine occurrence unable to measure. 50 mL at 1850 bladder scan.  RN inquired about prosthesis to be used for walking O2. Patient stated has not used prosthesis recently (pt. unable to recall last time used prosthesis) due to poor fit (edema) and uses wheelchair. Informed night RN to inform day RN to report to MD patient not currently using prosthesis so unable to complete walking O2. Encouraged patient to inform MD of wheelchair use.  Patient reported increased work of breathing when talking after weaning to 2L; increased O2 to 2.5L.   Patient's son brought fast food dinner to hospital for patient. Patient reported did not like dinner ordered. Informed patient of need to monitor sodium intake.

## 2019-02-07 NOTE — Discharge Instructions (Signed)

## 2019-02-07 NOTE — Consult Note (Addendum)
Cardiology Consultation:   Patient ID: Stephanie Griffith; [id number]; 08/16/1945   Admit date: 02/05/2019 Date of Consult: 02/07/2019  Primary Care Provider: [name], MD Primary Cardiologist: Minus Breeding, MD Primary Electrophysiologist:  None  Chief Complaint: shortness of breath and swelling  Patient Profile:   Stephanie Griffith is a 73 y.o. female with a hx of chronic diastolic CHF, previously moderate (now severe) pumonary HTN, CKD IV, anemia of chronic disease, HTN, remote paroxysmal atrial fibrillation, PAT, COPD, PAD (s/p femoral to below knee popliteal bypass then L AKA) who is being seen today for the evaluation of CHF at the request of Dr. [name].  History of Present Illness:   Our team met her in 2013 for isolated episode of atrial fib in context of medical issues, not anticoagulated given surgical issue (BKA) and hemoptysis at that time. We saw again in 2017 for atrial fib in context of recent hernia surgery. Anticoagulation was deferred as it was a short term episode. She had prior normal EF, diastolic dysfunction and at least moderate pulmonary HTN. I do not see that she has seen cardiology in the outpatient setting. Per review of labs through [facility], last outpatient Cr was 2.88 in 09/2018 via [facility] with Hgb 9.8 at that time.  She presented to the ED with worsening swelling, dyspnea, and orthopnea over 1 weeks' period of time. She has chronic skin changes of her right lower extremity which do suggest some degree of longstanding edema. She has not had any chest pain or palpitations. She reported compliance with her medications although had been eating a lot of spaghetti recently. She denied any hemoptysis, fevers, or chills. 2D echo 02/07/19 (this admission) showed EF 60-65%, moderately increased LVH, mildly dilated RV systolic, severe RAE, mild-moderate TR, mild AI, severely elevated PASP of 109.6mHg. She has received IV Lasix 46mx 3 doses which she states has helped quite a  bit. She's no longer SOB at rest. However, she still has significant edema on board. There are reports that she was on a diuretic at one point but it was stopped, possibly due to kidney issues. I/O's incomplete due to unmeasured urinary occurrences. Weight 220lb->215lb (97.7kg). Last OP weight 101.2kg. She lives at home with her daughter. She does not recall ever having seen a kidney doctor.   Past Medical History:  Diagnosis Date   Anemia of chronic disease    Chronic diastolic CHF (congestive heart failure) (HCC)    Chronic renal insufficiency, stage IV (severe) (HCC)    COPD (chronic obstructive pulmonary disease) (HCC)    Hyperglycemia    Hyperlipidemia    Hypertension    PAD ([place]ipheral artery disease) ([facility]   a. s/p femoral to below knee popliteal bypass then L AKA.   PAF (paroxysmal atrial fibrillation) (HCC)    PAT (paroxysmal atrial tachycardia) (HCC)    [place]ulmonary [place]   Umbilical hernia     Past Surgical History:  Procedure Laterality Date   AMPUTATION  06/06/2011   Procedure: AMPUTATION DIGIT;  Surgeon: ChElam DutchMD;  [facility];  Laterality: Left;  Transmetatarsal amputation left great toe   AMPUTATION  06/12/2011   Procedure: AMPUTATION ABOVE KNEE;  Surgeon: ChElam DutchMD;  [facility];  Laterality: Left;   FEMORAL-POPLITEAL BYPASS GRAFT  06/06/2011   Procedure: BYPASS GRAFT FEMORAL-POPLITEAL ARTERY;  Surgeon: ChElam DutchMD;  [facility];  Laterality: Left;  left  femoral to popliteal bypass with composite vein and propaten 39m x 80 graft   LOWER EXTREMITY ANGIOGRAM N/A 06/04/2011   Procedure: LOWER EXTREMITY ANGIOGRAM;  Surgeon: CAngelia Mould MD;  [facility];  Service: Cardiovascular;  Laterality: N/A;   OMENTECTOMY  12/31/2015   Procedure: Partial OMENTECTOMY;  Surgeon: BGeorganna Skeans MD;  [facility] [facility]: General;;    TUBAL LIGATION     VENTRAL HERNIA REPAIR N/A 12/31/2015   Procedure: REPAIR INCARCERATED VENTRAL HERNIA, POSSIBLE BOWEL RESECTION;  Surgeon: BGeorganna Skeans MD;  [facility]: General;  Laterality: N/A;     Inpatient Medications: Scheduled Meds:  aspirin  81 mg Oral Daily   furosemide  40 mg Intravenous Once   gabapentin  100 mg Oral BID   heparin  5,000 Units Subcutaneous Q8H   influenza vaccine adjuvanted  0.5 mL Intramuscular Tomorrow-1000   magnesium oxide  800 mg Oral BID   mouth rinse  15 mL Mouth Rinse BID   metoprolol tartrate  50 mg Oral BID   sodium bicarbonate  650 mg Oral TID   sodium chloride flush  3 mL Intravenous Q12H   Continuous Infusions:  sodium chloride     PRN Meds: sodium chloride, acetaminophen, hydrALAZINE, ondansetron (ZOFRAN) IV, polyethylene glycol, senna-docusate, sodium chloride flush, zolpidem  Home Meds: Prior to Admission medications   Medication Sig Start Date End Date Taking? Authorizing Provider  amLODipine (NORVASC) 10 MG tablet Take 10 mg by mouth daily.  07/08/14  Yes [provider]  aspirin 81 MG EC tablet Take 81 mg by mouth daily.     Yes [provider]  gabapentin (NEURONTIN) 100 MG capsule Take 100 mg by mouth 2 (two) times daily.  06/23/14  Yes [provider]  metoprolol (LOPRESSOR) 50 MG tablet Take 1 tablet (50 mg total) by mouth 2 (two) times daily. 01/10/16  Yes JDomenic Polite MD  Multiple Vitamin (MULTIVITAMIN WITH MINERALS) TABS tablet Take 1 tablet by mouth daily.   Yes [provider]    Allergies:    Allergies  Allergen Reactions   Morphine And Related     AMS    Social History:   Social History   Socioeconomic History   Marital status: Married    Spouse name: Not on file   Number of children: Not on file   Years of education: Not on file   Highest education level: Not on file  Occupational History   Not on file  Social Needs   Financial  resource strain: Not on file   Food insecurity    Worry: Not on file    Inability: Not on file   Transportation needs    Medical: Not on file    Non-medical: Not on file  Tobacco Use   Smoking status: Former Smoker    Packs/day: 2.00    Years: 50.00    Pack years: 100.00    Quit date: 06/01/2011    Years since quitting: 7.6   Smokeless tobacco: Never Used  Substance and Sexual Activity   Alcohol use: No   Drug use: No   Sexual activity: Not Currently  Lifestyle   Physical activity    Days per week: Not on file    Minutes per session: Not on file   Stress: Not on file  Relationships   Social connections    Talks on phone: Not on file    Gets together: Not on file    Attends religious  service: Not on file    Active member of club or organization: Not on file    Attends meetings of clubs or organizations: Not on file    Relationship status: Not on file   Intimate partner violence    Fear of current or ex partner: Not on file    Emotionally abused: Not on file    Physically abused: Not on file    Forced sexual activity: Not on file  Other Topics Concern   Not on file  Social History Narrative   Not on file     Family History:    Family History  Problem Relation Age of Onset   Heart attack Father    Heart disease Father    Diabetes Mother    Hypertension Mother    Heart attack Brother       ROS:  Please see the history of present illness.  All other ROS reviewed and negative.     Physical Exam/Data:   Vitals:   02/07/19 0530 02/07/19 0725 02/07/19 1001 02/07/19 1150  BP: (!) 145/63 121/63 135/60 (!) 142/63  Pulse: 87 74 75 83  Resp: 20 18  18   Temp: 97.9 F (36.6 C) 98.3 F (36.8 C)  97.6 F (36.4 C)  TempSrc: Oral Oral  Oral  SpO2: 98% 100%  98%  Weight: 97.7 kg     Height:        Intake/Output Summary (Last 24 hours) at 02/07/2019 1223 Last data filed at 02/07/2019 1023 Gross per 24 hour  Intake 1263 ml  Output 750 ml  Net  513 ml   Last 3 Weights 02/07/2019 02/06/2019 07/04/2017  Weight (lbs) 215 lb 6.2 oz 220 lb 11.2 oz 223 lb  Weight (kg) 97.7 kg 100.109 kg 101.152 kg     Body mass index is 32.75 kg/m.  General: Well developed AAF in no acute distress. Head: Normocephalic, atraumatic, sclera non-icteric, no xanthomas, nares are without discharge. Poor dentition. Neck: Negative for carotid bruits. JVD not elevated. Lungs: Clear bilaterally to auscultation without wheezes, rales, or rhonchi. Breathing is unlabored. Heart: RRR with S1 S2. No murmurs, rubs, or gallops appreciated. Abdomen: Soft, non-tender, non-distended with normoactive bowel sounds. No hepatomegaly. No rebound/guarding. No obvious abdominal masses. Msk:  Strength and tone appear normal for age. Extremities: No clubbing or cyanosis. S/p L AKA. 2+ significant RLL edema with chronic skin thickening Neuro: Alert and oriented X 3. No facial asymmetry. No focal deficit. Moves all extremities spontaneously. Psych:  Responds to questions appropriately with a normal affect.   EKG:  The EKG was personally reviewed and demonstrates:  NSR 87bpm, low voltage QRS, cannot rule out prior anteiror infarct, baseline artifact making interpretation challenging Telemetry:  Telemetry was personally reviewed and demonstrates:  NSR, brief run of SVT, and brief run of what appears to be atrial fib yesterday evening  Relevant CV Studies: 2D echo 02/06/19 IMPRESSIONS  1. Left ventricular ejection fraction, by visual estimation, is 60 to 65%. The left ventricle has normal function. There is moderately increased left ventricular hypertrophy.  2. Left ventricular diastolic parameters are indeterminate.  3. Right ventricular volume and pressure overload.  4. Global right ventricle has mildly reduced systolic function.The right ventricular size is moderately enlarged.  5. Right atrial size was severely dilated.  6. The mitral valve is normal in structure. No evidence of  mitral valve regurgitation.  7. The tricuspid valve is normal in structure. Tricuspid valve regurgitation mild-moderate.  8. The aortic valve is tricuspid.  Aortic valve regurgitation is mild.  9. Severely elevated pulmonary artery systolic pressure. 10. The tricuspid regurgitant velocity is 4.86 m/s, and with an assumed right atrial pressure of 15 mmHg, the estimated right ventricular systolic pressure is severely elevated at 109.5 mmHg. 11. The inferior vena cava is dilated in size with <50% respiratory variability, suggesting right atrial pressure of 15 mmHg.  Laboratory Data:  High Sensitivity Troponin:   Recent Labs  Lab 02/05/19 1805 02/06/19 0205 02/06/19 0541  TROPONINIHS 59* 59* 49*     Cardiac EnzymesNo results for input(s): TROPONINI in the last 168 hours. No results for input(s): TROPIPOC in the last 168 hours.  Chemistry Recent Labs  Lab 02/05/19 1805 02/06/19 0312 02/07/19 0342  NA 142 145 145  K 5.4* 5.0 5.0  CL 121*  --  123*  CO2 13*  --  13*  GLUCOSE 103*  --  73  BUN 57*  --  64*  CREATININE 3.17*  --  3.36*  CALCIUM 7.0*  --  6.9*  GFRNONAA 14*  --  13*  GFRAA 16*  --  15*  ANIONGAP 8  --  9    Recent Labs  Lab 02/05/19 1805  PROT 7.0  ALBUMIN 2.4*  AST 26  ALT 20  ALKPHOS 166*  BILITOT 0.6   Hematology Recent Labs  Lab 02/05/19 1805 02/06/19 0312 02/06/19 1104 02/07/19 0342  WBC 12.4*  --  13.1* 10.0  RBC 2.81*  --  2.75* 2.58*  HGB 8.4* 7.5* 8.1* 7.5*  HCT 27.9* 22.0* 26.9* 25.1*  MCV 99.3  --  97.8 97.3  MCH 29.9  --  29.5 29.1  MCHC 30.1  --  30.1 29.9*  RDW 17.2*  --  17.0* 17.1*  PLT 342  --  307 293   BNP Recent Labs  Lab 02/06/19 0313  BNP 1,007.7*    DDimer  Recent Labs  Lab 02/06/19 0205  DDIMER 2.90*     Radiology/Studies:  Dg Chest 2 View  Result Date: 02/05/2019 CLINICAL DATA:  Shortness of breath. EXAM: CHEST - 2 VIEW COMPARISON:  Chest x-ray dated January 08, 2016. FINDINGS: Unchanged cardiomegaly.  Pulmonary vascular congestion. No focal consolidation, pleural effusion, or pneumothorax. No acute osseous abnormality. IMPRESSION: Cardiomegaly and pulmonary vascular congestion without overt edema. Electronically Signed   By: Titus Dubin M.D.   On: 02/05/2019 19:33   Vas [place] Lower Extremity Venous (dvt)  Result Date: 02/07/2019  Lower Venous Study Indications: Edema.  Limitations: Body habitus, poor ultrasound/tissue interface and skin texture. Comparison Study: No prior study Performing Technologist: Sharion Dove RVS  Examination Guidelines: A complete evaluation includes B-mode imaging, spectral Doppler, color Doppler, and power Doppler as needed of all accessible portions of each vessel. Bilateral testing is considered an integral part of a complete examination. Limited examinations for reoccurring indications may be performed as noted.  Right Technical Findings: Right leg not evaluated.  +---------+---------------+---------+-----------+----------+-------------------+  LEFT      Compressibility Phasicity Spontaneity Properties Thrombus Aging       +---------+---------------+---------+-----------+----------+-------------------+  CFV                                                        pulsatile            +---------+---------------+---------+-----------+----------+-------------------+  FV Prox  Yes       Yes                    patent by color and                                                              Doppler              +---------+---------------+---------+-----------+----------+-------------------+  FV Mid                    Yes       Yes                    patent by color and                                                              Doppler              +---------+---------------+---------+-----------+----------+-------------------+  FV Distal                 Yes       Yes                    patent by color and                                                               Doppler              +---------+---------------+---------+-----------+----------+-------------------+  PFV                                                        Not visualized       +---------+---------------+---------+-----------+----------+-------------------+  POP       Full                                             pulsatile            +---------+---------------+---------+-----------+----------+-------------------+  PTV                                                        Not visualized       +---------+---------------+---------+-----------+----------+-------------------+  PERO  Not visualized       +---------+---------------+---------+-----------+----------+-------------------+   Left Technical Findings: Not visualized segments include profunda, posterior tibial, peroenal veins.   Summary: Left: There is no evidence of deep vein thrombosis in the lower extremity. However, portions of this examination were limited- see technologist comments above.  *See table(s) above for measurements and observations.    Preliminary     Assessment and Plan:   1. Acute on chronic diastolic CHF with concomitant severe pulmonary HTN - LVEF remains normal but PASP severely elevated. Course complicated by rising creatinine, was 2.88 in June 2020 -> 3.17 -> 3.36 with diuresis. Likely needs nephrology consultation to establish planning discussions. Her albumin is also quite low which is probably contributing as well. She reports good clinical output so far with the IV Lasix that has been given, only 1 more dose ordered. Regarding cause of severe pulm HTN, wonder if this is related to volume overload. She carries a prior diagnosis of COPD in the chart but she herself denies this diagnosis. Will review with MD. Will also place dietitian consult for diet education with salt/CHF/renal disease.  2. AKI superimposed on CKD stage IV, hypocalcemia, anemia - as above. Needs  establishment with nephrology.  3. Paroxysmal atrial fib (also h/o PAT) - brief run noted on telemetry yesterday evening. Previously had AF in 2014, 2017 as well. Not on anticoagulation but also with worsening anemia and renal function this admission. Episode was extremely brief this admission. CHADSVASC is 5 for CHF, HTN, age, vascular disease, female. Will d/w MD but otherwise continue to monitor on telemetry.  4. Essential HTN - follow BP in context of diuresis.  5. Acute on chronic anemia - most recent Hgb in 09/2018 was 9.8, down to 7.5 this admission. Will place order for hemoccult and anemia panel. Recommend IM management in the context of renal disease.  For questions or updates, please contact [facility] Please consult [email or web address] for contact info under     Signed, Charlie Pitter, PA-C  02/07/2019 12:23 PM   Attending note  Patient seen and discussed with PA Dunn, I agree with her documentation above. 73 yo female history of HTN, CKD IV, pulmonary HTN, anemia, PAD admitted with SOB and edema   Admit labs K 5.4 Cr 3.17 BUN 57 Alb 2.4 WBC 12.4 Hgb 8.4 Plt 342 Ddimer 2.9 BNP 1007 Mg 1 hstrop 59-->59-->49 COVID neg CXR cardiomegaly, pulm congestion EKG heavy artifact, will repeat  Acute on chronic diasotlic HF. Echo this admit LVEF 69-45%, indet diastolic function, RV pressure and volume overload, mild RV dysfunction, severe RAE, severe pulm HTN PASP 109 mmHg.  I/Os +90 yesterday, she received lasix 99m IV x 2. Mild uptred in Cr. With renal dysfunction will require much higher dosing, hopefullly a component of her worsening renal function is venous congestoin and will improve with diuresis. Change lasix to 817mIV bid   Severe pulmonary HTN by echo. I suspect likely related to left sided heart disease and significant volume overload. Would repeat echo once euvolemic to reassess, if persistent could consider further workup.   AKI on CKD. Cr last year 05/2017 2.09, on admit  3.17. Recommend nephrology consultation   Anemia with Hgb on admit 8.4, down to 7.5. Workup per primary team. Hgb was 9.6 a year ago.   Afib this admit, short episode. Pending anemia trends and workup will consider anticoag   JoCarlyle DollyD

## 2019-02-07 NOTE — Consult Note (Addendum)
Renal Service Consult Note [facility]  Stephanie Griffith 02/07/2019 Sol Blazing Requesting Physician:  Dr Stephanie Griffith  Reason for Consult:  Renal failure HPI: The patient is a 73 y.o. year-old w/ hx of pulm HTN, PAF, PAD, HTN, HL, COPD, CKD IV and chron diast CHF presented on 11/06 with SOB and LE edema.  CXR showed CM w/ congestion, BNP > 1000. Started IV lasix and admitted. Creat 3.17 on admission, up from 2.06 in Feb 2020.  Overnight 750 cc UOP recorded, today creat 3.3. Asked to see for renal failure.   Patient states she has been here for about 2 days and they've emptied the purewick canister about "4-5 time". Limited UOP is recorded thouhg. Her breathing is better.  She doesn't have a kidney doctor.  She denies nsaid's.  She is not on any lasix / fluid pills at home.  She has never had issues like this before. She sees Dr Stephanie Griffith as her PCP.    Baseline creat from 2017- 19 = 1.66- 2.60  ROS  denies CP  no joint pain   no HA  no blurry vision  no rash  no diarrhea  no nausea/ vomiting  no dysuria  no difficulty voiding  no change in urine color    Past Medical History  Past Medical History:  Diagnosis Date  . Anemia of chronic disease   . Chronic diastolic CHF (congestive heart failure) (Stephanie Griffith)   . Chronic renal insufficiency, stage IV (severe) (Stephanie Griffith)   . COPD (chronic obstructive pulmonary disease) (Stephanie Griffith)   . Hyperglycemia   . Hyperlipidemia   . Hypertension   . PAD (peripheral artery disease) (Stephanie Griffith)    a. s/p femoral to below knee popliteal bypass then L AKA.  Stephanie Griffith PAF (paroxysmal atrial fibrillation) (Stephanie Griffith)   . PAT (paroxysmal atrial tachycardia) (Stephanie Griffith)   . Pulmonary hypertension (Stephanie Griffith)   . Umbilical hernia    Past Surgical History  Past Surgical History:  Procedure Laterality Date  . AMPUTATION  06/06/2011   Procedure: AMPUTATION DIGIT;  Surgeon: Stephanie Dutch, MD;  Location: [facility] OR;  Service: Vascular;  Laterality: Left;  Transmetatarsal amputation left great  toe  . AMPUTATION  06/12/2011   Procedure: AMPUTATION ABOVE KNEE;  Surgeon: Stephanie Dutch, MD;  Location: [facility];  Service: Vascular;  Laterality: Left;  . FEMORAL-POPLITEAL BYPASS GRAFT  06/06/2011   Procedure: BYPASS GRAFT FEMORAL-POPLITEAL ARTERY;  Surgeon: Stephanie Dutch, MD;  Location: [facility] OR;  Service: Vascular;  Laterality: Left;  left femoral to popliteal bypass with composite vein and propaten 54m x 80 graft  . LOWER EXTREMITY ANGIOGRAM N/A 06/04/2011   Procedure: LOWER EXTREMITY ANGIOGRAM;  Surgeon: CAngelia Mould MD;  [facility];  Service: Cardiovascular;  Laterality: N/A;  . OMENTECTOMY  12/31/2015   Procedure: Partial OMENTECTOMY;  Surgeon: BGeorganna Skeans MD;  [facility]  Service: General;;  . TUBAL LIGATION    . VENTRAL HERNIA REPAIR N/A 12/31/2015   Procedure: REPAIR INCARCERATED VENTRAL HERNIA, POSSIBLE BOWEL RESECTION;  Surgeon: BGeorganna Skeans MD;  Location: [facility] OR;  Service: General;  Laterality: N/A;   Family History  Family History  Problem Relation Age of Onset  . Heart attack Father   . Heart disease Father   . Diabetes Mother   . Hypertension Mother   . Heart attack Brother    Social History  reports that she quit smoking about 7 years ago. She has a 100.00 pack-year smoking history. She has never  used smokeless tobacco. She reports that she does not drink alcohol or use drugs. Allergies  Allergies  Allergen Reactions  . Morphine And Related     AMS   Home medications Prior to Admission medications   Medication Sig Start Date End Date Taking? Authorizing Provider  amLODipine (NORVASC) 10 MG tablet Take 10 mg by mouth daily.  07/08/14  Yes [provider]  aspirin 81 MG EC tablet Take 81 mg by mouth daily.     Yes [provider]  gabapentin (NEURONTIN) 100 MG capsule Take 100 mg by mouth 2 (two) times daily.  06/23/14  Yes [provider]  metoprolol (LOPRESSOR) 50 MG tablet Take 1 tablet (50 mg total) by mouth 2  (two) times daily. 01/10/16  Yes Domenic Polite, MD  Multiple Vitamin (MULTIVITAMIN WITH MINERALS) TABS tablet Take 1 tablet by mouth daily.   Yes [provider]   Liver Function Tests Recent Labs  Lab 02/05/19 1805  AST 26  ALT 20  ALKPHOS 166*  BILITOT 0.6  PROT 7.0  ALBUMIN 2.4*   No results for input(s): LIPASE, AMYLASE in the last 168 hours. CBC Recent Labs  Lab 02/05/19 1805 02/06/19 0312 02/06/19 1104 02/07/19 0342  WBC 12.4*  --  13.1* 10.0  NEUTROABS 9.0*  --  10.7*  --   HGB 8.4* 7.5* 8.1* 7.5*  HCT 27.9* 22.0* 26.9* 25.1*  MCV 99.3  --  97.8 97.3  PLT 342  --  307 659   Basic Metabolic Panel Recent Labs  Lab 02/05/19 1805 02/06/19 0312 02/07/19 0342  NA 142 145 145  K 5.4* 5.0 5.0  CL 121*  --  123*  CO2 13*  --  13*  GLUCOSE 103*  --  73  BUN 57*  --  64*  CREATININE 3.17*  --  3.36*  CALCIUM 7.0*  --  6.9*   Iron/TIBC/Ferritin/ %Sat    Component Value Date/Time   IRON 29 02/07/2019 1552   TIBC 151 (L) 02/07/2019 1552   FERRITIN 75 02/07/2019 1552   IRONPCTSAT 19 02/07/2019 1552    Vitals:   02/07/19 0725 02/07/19 1001 02/07/19 1150 02/07/19 1553  BP: 121/63 135/60 (!) 142/63 (!) 153/83  Pulse: 74 75 83 79  Resp: 18  18   Temp: 98.3 F (36.8 C)  97.6 F (36.4 C) 97.8 F (36.6 C)  TempSrc: Oral  Oral Oral  SpO2: 100%  98% 99%  Weight:      Height:        Exam Gen elderly pleasant AAF, obese, no distress, nasal O2 No rash, cyanosis or gangrene Sclera anicteric, throat clear  +JVD Chest bilat basilar rales , no wheezing, no ^wob RRR no MRG Abd soft ntnd no mass or ascites +bs GU purewick draining pale yellow urine MS L AKA intact Ext bilat dependent 2+ hip edema, no wounds or ulcers, scaly skin R lower leg Neuro is alert, Ox 3 , nf    Home meds:  - amlodipine 10/ metoprolol 50 bid  - gabapentin 100 bid  - aspirin 81      Date  Creat  eGFR  2009- 10 1.93- 3.11  2013  1.46- 2.97   2017  1.66- 2.79 16- 30   Feb 2019  2.06  23  02/05/2019 3.17  16, stage IV  02/07/2019 3.36  15, stage IV-V    Na 145 K 5.0  CO2 13  BUN 64  Cr 3.36  Mg 1.0 Ca 6.9  Alb 2.4   WBC 10k  Hb 7.5  plt 293   CXR 11/5 > Cardiomegaly and pulmonary vascular congestion without overt edema  ECHO - LVEF 60%, RV pressure/ vol overload, RV slightly reduced fxn. RA severely dilated, severe ^RV sys 136m Hg. IVC dilated w/ low resp variability suggesting RA pressure 131mHg.   Assessment/ Plan: 1. Renal failure - since 2013 - Feb 2019 patient has had mostly CKD IV disease w/ creat 1.6- 2.5.  Now creat 3's, may be a/c renal failure from fluid overload vs. progression of chronic kidney disease vs other. No uremic symptoms.  Prob has HTN'sive disease, but will need UA and renal [place] initial w/u. Diuresing well, suspect vol excess due to renal failure in large part. Recommend cont diuresing.  She agrees to f/u with [facility] after dc.  Will follow.  2. Pulm edema - w/ SOB, vol overload, LE edema. Diuresing as above 3. HTN - cont meds BP's reasonably well controlled 4. Met acidosis - po bicarb started  5. SP L AKA 6. Obesity 7. Pulm HTN 8. COPD 9. Chron diast HF      RoKelly SplinterMD 02/07/2019, 5:43 PM

## 2019-02-07 NOTE — Plan of Care (Signed)
Nutrition Education Note RD working remotely.  RD consulted for nutrition education. Unable to reach patient via phone, per chart review patient in Vascular Lab. Education materials have been attached to patient discharge instructions.   RD provided "Low Sodium Nutrition Therapy" handout from the Academy of Nutrition and Dietetics. Handout provides examples on ways to decrease sodium intake in diet. Discourages intake of processed foods and use of salt shaker. Encourages fresh fruits and vegetables as well as whole grain sources of carbohydrates to maximize fiber intake.   Handout discusses why it is important for patient to adhere to diet recommendations, and emphasizes the role of fluids, foods to avoid, and importance of weighing self daily.   Body mass index is 32.75 kg/m. Pt meets criteria for obesity unspecified based on current BMI.  Current diet order is Renal/1589ml, patient is consuming approximately 80-100% of meals at this time. Labs and medications reviewed. No further nutrition interventions warranted at this time. RD contact information provided. If additional nutrition issues arise, please re-consult RD.   Lajuan Lines, RD, LDN Clinical Nutrition [place] Telephone [phone] After Hours/Weekend Pager: [phone]

## 2019-02-07 NOTE — Progress Notes (Addendum)
PROGRESS NOTE    Stephanie Griffith  [id number] DOB: [birth year] DOA: 02/05/2019 PCP: [name], MD   Brief Narrative:  73 y.o. female with medical history significant for chronic kidney disease stage IV, peripheral vascular disease status post left AKA, hypertension, chronic anemia, and chronic diastolic CHF, now presenting to the emergency department with 1 week of progressive leg swelling and shortness of breath.   Found to be in CHF exacerbation complicated by CKD.   Assessment & Plan:   Principal Problem:   Acute on chronic diastolic CHF (congestive heart failure) (HCC) Active Problems:   Normocytic anemia   Essential hypertension, benign   Hyperkalemia   Chronic renal insufficiency, stage IV (severe) (HCC)   Metabolic acidosis  Acute on chronic congestive heart failure with preserved ejection fraction, class III -Plans to wean off oxygen.  1 dose of IV Lasix.  Monitor response and renal function -Echocardiogram 11/6-60 to 65%, right ventricular volume and pressure overload. -Consulted cardiology= Dr Harl Bowie -Monitor electrolytes and replete as necessary -Wean off oxygen  Acute kidney injury on chronic kidney disease stage IV -Baseline 2.0.  Admission creatinine 3.1, trended up to 3.36.  Wonder if this is her new baseline but we will continue to monitor.  Diuresis with caution.  Monitor urine output.  Essential hypertension -Getting aggressive diuretics.  Will adjust cardiac regimen as appropriate  Anemia of chronic disease -Baseline hemoglobin 8.0.  Continue to closely monitor.  Elevated D-dimer -Lower extremity Dopplers done-negative.  Unable to perform CTA chest given renal dysfunction.  Low suspicion for PE.  DVT prophylaxis: Subcutaneous heparin Code Status: Full code Family Communication: None at bedside Disposition Plan: Maintain hospital stay for diuresis with caution while monitoring renal function.  Consultants:   Cardiology  Procedures:    None  Antimicrobials:   None   Subjective: Feels slightly better than yesterday.  Still having some exertional shortness of breath.  Does not use any oxygen at home.  Review of Systems Otherwise negative except as per HPI, including: General: Denies fever, chills, night sweats or unintended weight loss. Resp: Denies cough, wheezing Cardiac: Denies chest pain, palpitations, orthopnea, paroxysmal nocturnal dyspnea. GI: Denies abdominal pain, nausea, vomiting, diarrhea or constipation GU: Denies dysuria, frequency, hesitancy or incontinence MS: Denies muscle aches, joint pain or swelling Neuro: Denies headache, neurologic deficits (focal weakness, numbness, tingling), abnormal gait Psych: Denies anxiety, depression, SI/HI/AVH Skin: Denies new rashes or lesions ID: Denies sick contacts, exotic exposures, travel  Objective: Vitals:   02/07/19 0046 02/07/19 0530 02/07/19 0725 02/07/19 1001  BP: (!) 142/66 (!) 145/63 121/63 135/60  Pulse: 87 87 74 75  Resp: 18 20 18    Temp: 98.4 F (36.9 C) 97.9 F (36.6 C) 98.3 F (36.8 C)   TempSrc: Oral Oral Oral   SpO2: 100% 98% 100%   Weight:  97.7 kg    Height:        Intake/Output Summary (Last 24 hours) at 02/07/2019 1149 Last data filed at 02/07/2019 1023 Gross per 24 hour  Intake 1263 ml  Output 750 ml  Net 513 ml   Filed Weights   02/06/19 1058 02/07/19 0530  Weight: 100.1 kg 97.7 kg    Examination:  General exam: Appears calm and comfortable, 2 L nasal cannula Respiratory system: Bilateral crackles at the bases Cardiovascular system: S1 & S2 heard, RRR. No JVD, murmurs, rubs, gallops or clicks. No pedal edema. Gastrointestinal system: Abdomen is nondistended, soft and nontender. No organomegaly or masses felt. Normal bowel sounds heard. Central  nervous system: Alert and oriented. No focal neurological deficits. Extremities: Symmetric 5 x 5 power. Skin: No rashes, lesions or ulcers Psychiatry: Judgement and insight  appear normal. Mood & affect appropriate.     Data Reviewed:   CBC: Recent Labs  Lab 02/05/19 1805 02/06/19 0312 02/06/19 1104 02/07/19 0342  WBC 12.4*  --  13.1* 10.0  NEUTROABS 9.0*  --  10.7*  --   HGB 8.4* 7.5* 8.1* 7.5*  HCT 27.9* 22.0* 26.9* 25.1*  MCV 99.3  --  97.8 97.3  PLT 342  --  307 0000000   Basic Metabolic Panel: Recent Labs  Lab 02/05/19 1805 02/06/19 0312 02/06/19 1104 02/07/19 0342  NA 142 145  --  145  K 5.4* 5.0  --  5.0  CL 121*  --   --  123*  CO2 13*  --   --  13*  GLUCOSE 103*  --   --  73  BUN 57*  --   --  64*  CREATININE 3.17*  --   --  3.36*  CALCIUM 7.0*  --   --  6.9*  MG  --   --  1.0* 1.0*   GFR: Estimated Creatinine Clearance: 18.2 mL/min (A) (by C-G formula based on SCr of 3.36 mg/dL (H)). Liver Function Tests: Recent Labs  Lab 02/05/19 1805  AST 26  ALT 20  ALKPHOS 166*  BILITOT 0.6  PROT 7.0  ALBUMIN 2.4*   No results for input(s): LIPASE, AMYLASE in the last 168 hours. No results for input(s): AMMONIA in the last 168 hours. Coagulation Profile: No results for input(s): INR, PROTIME in the last 168 hours. Cardiac Enzymes: No results for input(s): CKTOTAL, CKMB, CKMBINDEX, TROPONINI in the last 168 hours. BNP (last 3 results) No results for input(s): PROBNP in the last 8760 hours. HbA1C: No results for input(s): HGBA1C in the last 72 hours. CBG: No results for input(s): GLUCAP in the last 168 hours. Lipid Profile: No results for input(s): CHOL, HDL, LDLCALC, TRIG, CHOLHDL, LDLDIRECT in the last 72 hours. Thyroid Function Tests: No results for input(s): TSH, T4TOTAL, FREET4, T3FREE, THYROIDAB in the last 72 hours. Anemia Panel: No results for input(s): VITAMINB12, FOLATE, FERRITIN, TIBC, IRON, RETICCTPCT in the last 72 hours. Sepsis Labs: No results for input(s): PROCALCITON, LATICACIDVEN in the last 168 hours.  Recent Results (from the past 240 hour(s))  SARS CORONAVIRUS 2 (TAT 6-24 HRS) Nasopharyngeal  Nasopharyngeal Swab     Status: None   Collection Time: 02/06/19  4:36 AM   Specimen: Nasopharyngeal Swab  Result Value Ref Range Status   SARS Coronavirus 2 NEGATIVE NEGATIVE Final    Comment: (NOTE) SARS-CoV-2 target nucleic acids are NOT DETECTED. The SARS-CoV-2 RNA is generally detectable in upper and lower respiratory specimens during the acute phase of infection. Negative results do not preclude SARS-CoV-2 infection, do not rule out co-infections with other pathogens, and should not be used as the sole basis for treatment or other patient management decisions. Negative results must be combined with clinical observations, patient history, and epidemiological information. The expected result is Negative. Fact Sheet for Patients: SugarRoll.be Fact Sheet for Healthcare Providers: https://www.woods-mathews.com/ This test is not yet approved or cleared by the [place] FDA and  has been authorized for detection and/or diagnosis of SARS-CoV-2 by FDA under an Emergency Use Authorization (EUA). This EUA will remain  in effect (meaning this test can be used) for the duration of the COVID-19 declaration under Section 56 4(b)(1) of the Act,  21 U.S.C. section 360bbb-3(b)(1), unless the authorization is terminated or revoked sooner. Performed at [facility], [place] [address], [place], [place] [address]          Radiology Studies: Dg Chest 2 View  Result Date: 02/05/2019 CLINICAL DATA:  Shortness of breath. EXAM: CHEST - 2 VIEW COMPARISON:  Chest x-ray dated January 08, 2016. FINDINGS: Unchanged cardiomegaly. Pulmonary vascular congestion. No focal consolidation, pleural effusion, or pneumothorax. No acute osseous abnormality. IMPRESSION: Cardiomegaly and pulmonary vascular congestion without overt edema. Electronically Signed   By: Titus Dubin M.D.   On: 02/05/2019 19:33   Vas [place] Lower Extremity Venous (dvt)  Result Date:  02/07/2019  Lower Venous Study Indications: Edema.  Limitations: Body habitus, poor ultrasound/tissue interface and skin texture. Comparison Study: No prior study Performing Technologist: Sharion Dove RVS  Examination Guidelines: A complete evaluation includes B-mode imaging, spectral Doppler, color Doppler, and power Doppler as needed of all accessible portions of each vessel. Bilateral testing is considered an integral part of a complete examination. Limited examinations for reoccurring indications may be performed as noted.  Right Technical Findings: Right leg not evaluated.  +---------+---------------+---------+-----------+----------+-------------------+  LEFT      Compressibility Phasicity Spontaneity Properties Thrombus Aging       +---------+---------------+---------+-----------+----------+-------------------+  CFV                                                        pulsatile            +---------+---------------+---------+-----------+----------+-------------------+  FV Prox                   Yes       Yes                    patent by color and                                                              Doppler              +---------+---------------+---------+-----------+----------+-------------------+  FV Mid                    Yes       Yes                    patent by color and                                                              Doppler              +---------+---------------+---------+-----------+----------+-------------------+  FV Distal                 Yes       Yes                    patent by color and  Doppler              +---------+---------------+---------+-----------+----------+-------------------+  PFV                                                        Not visualized       +---------+---------------+---------+-----------+----------+-------------------+  POP       Full                                              pulsatile            +---------+---------------+---------+-----------+----------+-------------------+  PTV                                                        Not visualized       +---------+---------------+---------+-----------+----------+-------------------+  PERO                                                       Not visualized       +---------+---------------+---------+-----------+----------+-------------------+   Left Technical Findings: Not visualized segments include profunda, posterior tibial, peroenal veins.   Summary: Left: There is no evidence of deep vein thrombosis in the lower extremity. However, portions of this examination were limited- see technologist comments above.  *See table(s) above for measurements and observations.    Preliminary         Scheduled Meds:  aspirin  81 mg Oral Daily   gabapentin  100 mg Oral BID   heparin  5,000 Units Subcutaneous Q8H   influenza vaccine adjuvanted  0.5 mL Intramuscular Tomorrow-1000   magnesium oxide  800 mg Oral BID   mouth rinse  15 mL Mouth Rinse BID   metoprolol tartrate  50 mg Oral BID   sodium bicarbonate  650 mg Oral TID   sodium chloride flush  3 mL Intravenous Q12H   Continuous Infusions:  sodium chloride       LOS: 1 day   Time spent= 35 mins    Tylan Kinn Arsenio Loader, MD [facility]  If 7PM-7AM, please contact night-coverage  02/07/2019, 11:49 AM

## 2019-02-07 NOTE — Progress Notes (Signed)
VASCULAR LAB PRELIMINARY  PRELIMINARY  PRELIMINARY  PRELIMINARY  Right lower extremity venous duplex completed.    Preliminary report:  See CV proc for preliminary results.   Veida Spira, RVT 02/07/2019, 10:27 AM

## 2019-02-08 ENCOUNTER — Inpatient Hospital Stay (HOSPITAL_COMMUNITY): Payer: Medicare Other

## 2019-02-08 LAB — BASIC METABOLIC PANEL
Anion gap: 7 (ref 5–15)
BUN: 65 mg/dL — ABNORMAL HIGH (ref 8–23)
CO2: 16 mmol/L — ABNORMAL LOW (ref 22–32)
Calcium: 6.9 mg/dL — ABNORMAL LOW (ref 8.9–10.3)
Chloride: 121 mmol/L — ABNORMAL HIGH (ref 98–111)
Creatinine, Ser: 3.42 mg/dL — ABNORMAL HIGH (ref 0.44–1.00)
GFR calc Af Amer: 15 mL/min — ABNORMAL LOW (ref >60)
GFR calc non Af Amer: 13 mL/min — ABNORMAL LOW (ref >60)
Glucose, Bld: 74 mg/dL (ref 70–99)
Potassium: 4.8 mmol/L (ref 3.5–5.1)
Sodium: 144 mmol/L (ref 135–145)

## 2019-02-08 LAB — IRON AND TIBC
Iron: 27 ug/dL — ABNORMAL LOW (ref 28–170)
Saturation Ratios: 16 % (ref 10.4–31.8)
TIBC: 165 ug/dL — ABNORMAL LOW (ref 250–450)
UIBC: 138 ug/dL

## 2019-02-08 LAB — CBC
HCT: 24.1 % — ABNORMAL LOW (ref 36.0–46.0)
Hemoglobin: 7.4 g/dL — ABNORMAL LOW (ref 12.0–15.0)
MCH: 29.7 pg (ref 26.0–34.0)
MCHC: 30.7 g/dL (ref 30.0–36.0)
MCV: 96.8 fL (ref 80.0–100.0)
Platelets: 273 10*3/uL (ref 150–400)
RBC: 2.49 MIL/uL — ABNORMAL LOW (ref 3.87–5.11)
RDW: 17.1 % — ABNORMAL HIGH (ref 11.5–15.5)
WBC: 11.1 10*3/uL — ABNORMAL HIGH (ref 4.0–10.5)
nRBC: 0 % (ref 0.0–0.2)

## 2019-02-08 LAB — FERRITIN: Ferritin: 73 ng/mL (ref 11–307)

## 2019-02-08 LAB — MAGNESIUM: Magnesium: 1.4 mg/dL — ABNORMAL LOW (ref 1.7–2.4)

## 2019-02-08 LAB — VITAMIN B12: Vitamin B-12: 179 pg/mL — ABNORMAL LOW (ref 180–914)

## 2019-02-08 IMAGING — US US RENAL
1 series · 14 of 23 positions shown · non-contrast
Comparison: CT Abdomen and Pelvis [DATE].

CLINICAL DATA: 73-year-old female with stage 4 chronic kidney
disease.

EXAM:
RENAL / URINARY TRACT ULTRASOUND COMPLETE

[Series 1: us renal · 14 of 23 slices shown]
[im 1/23]
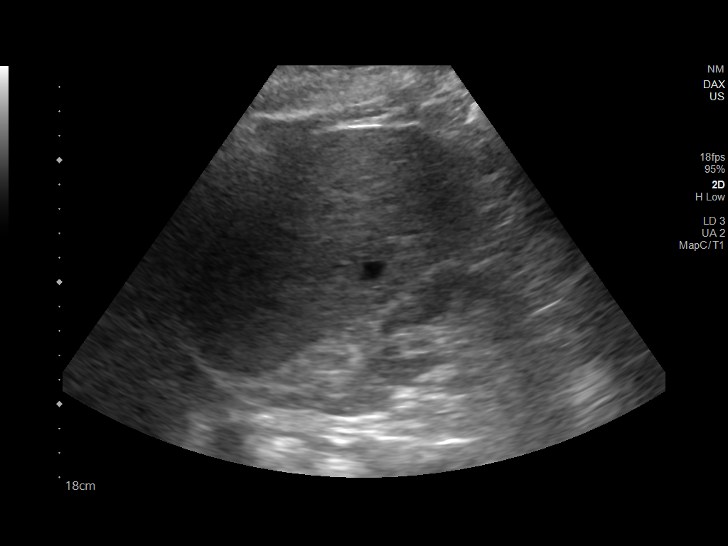
[im 3/23]
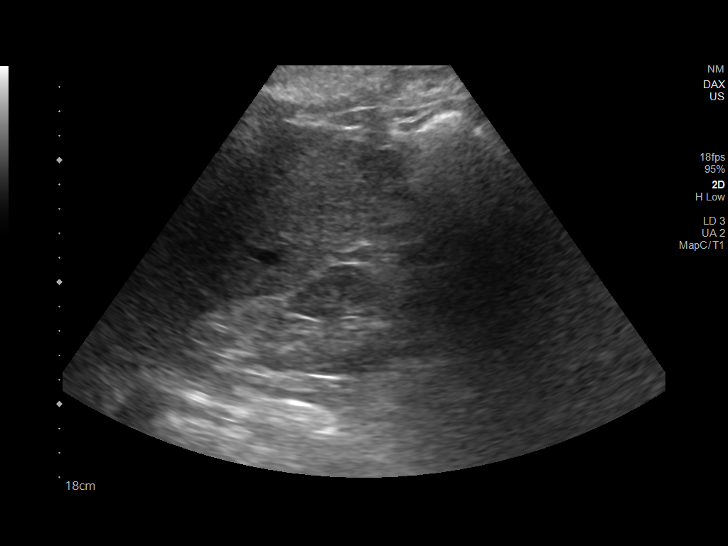
[im 5/23]
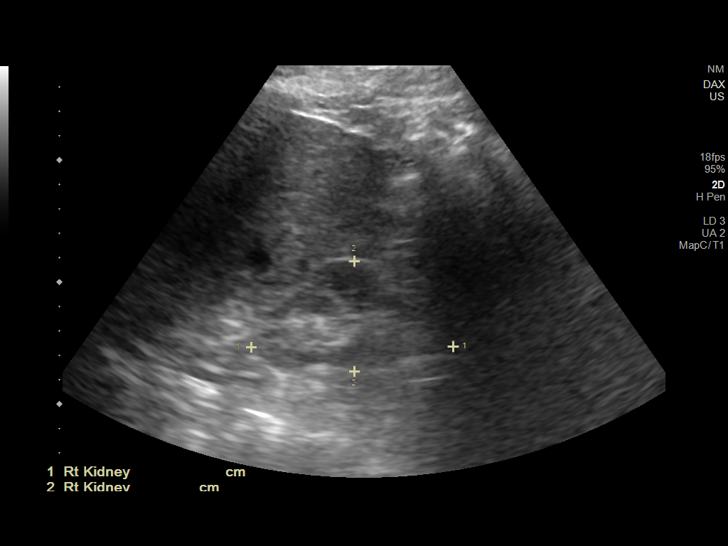
[im 6/23]
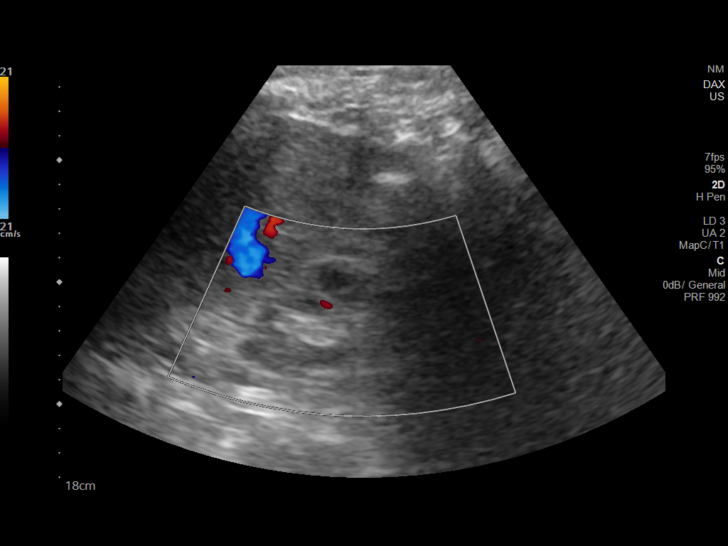
[im 8/23]
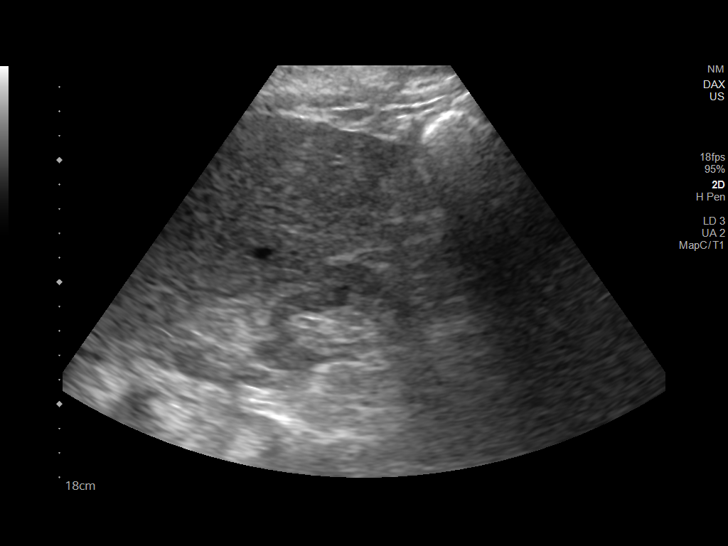
[im 10/23]
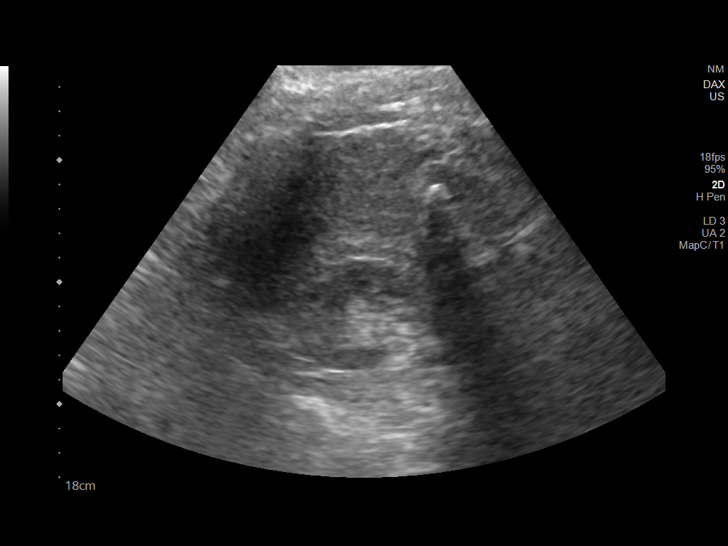
[im 11/23]
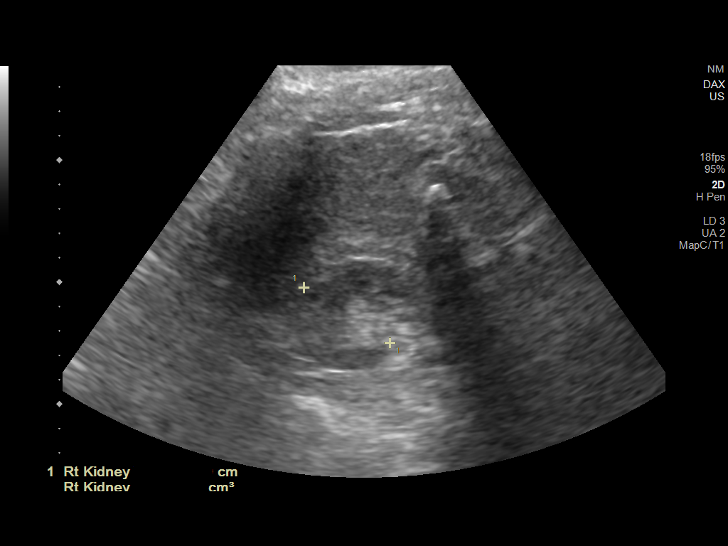
[im 13/23]
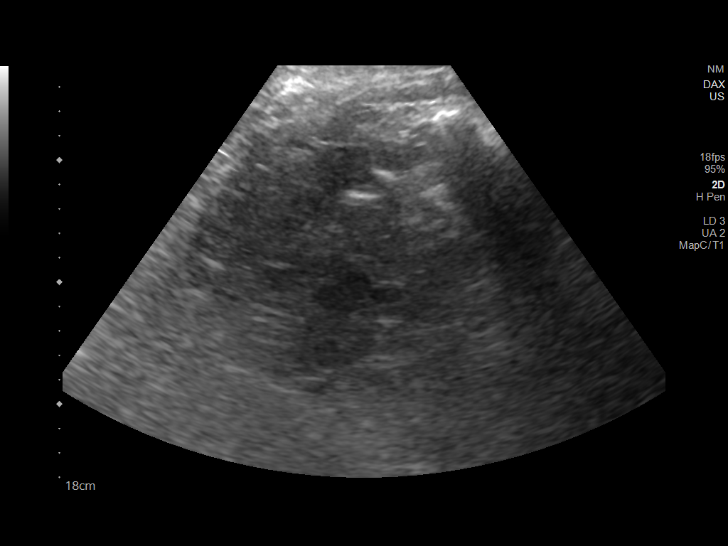
[im 14/23]
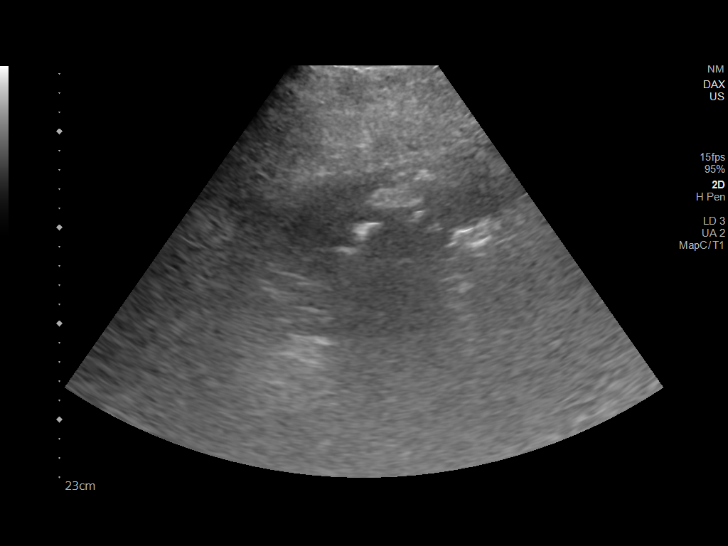
[im 16/23]
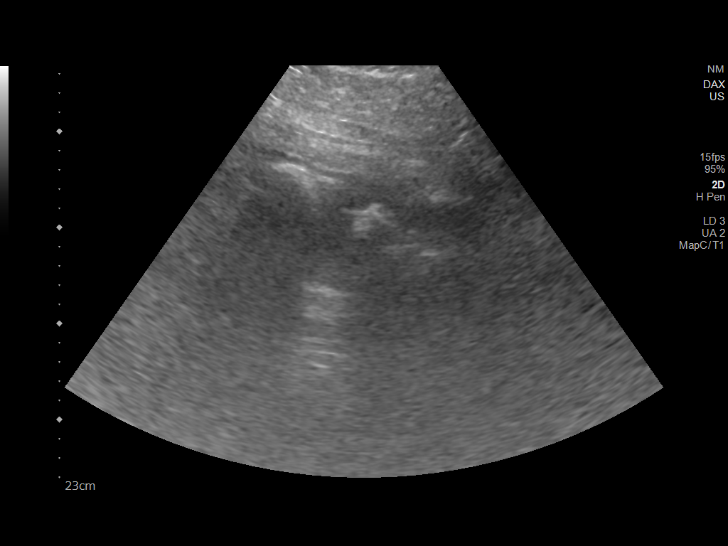
[im 18/23]
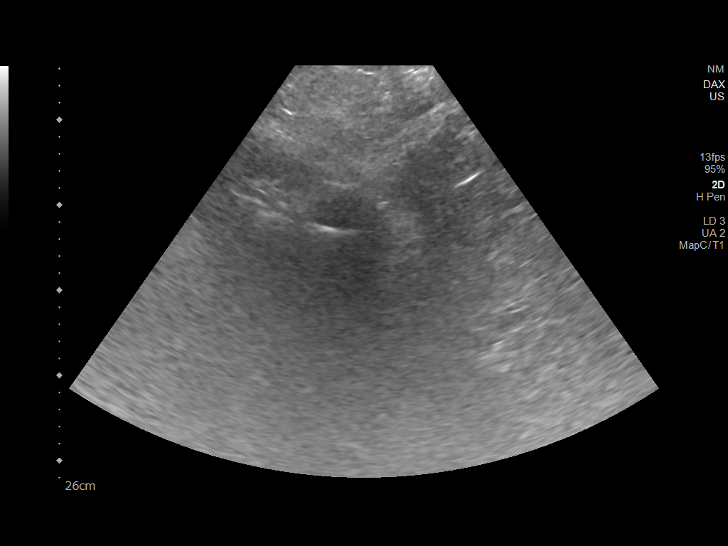
[im 19/23]
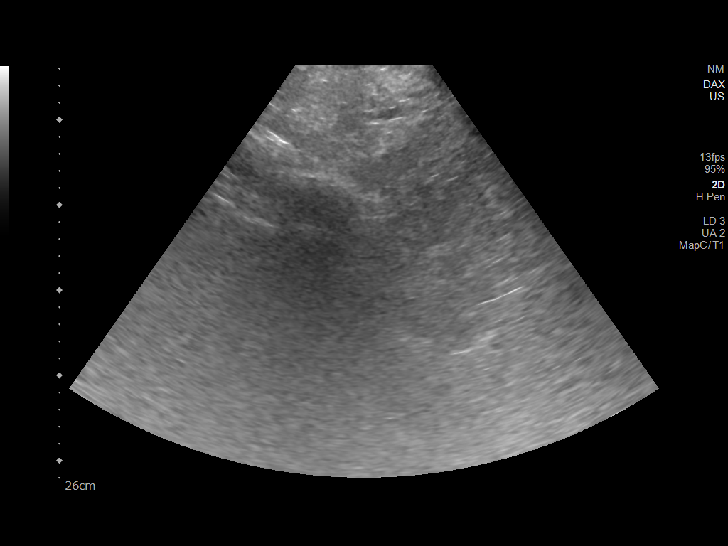
[im 21/23]
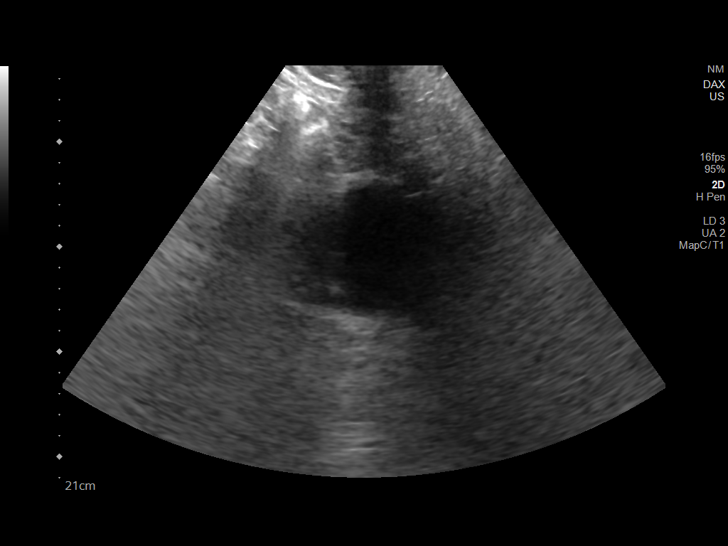
[im 23/23]
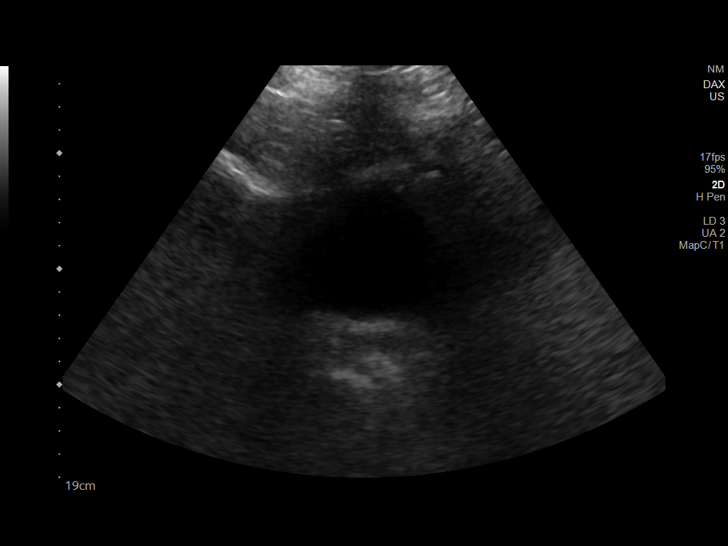

[14 of 23 positions shown; findings below may reference images not displayed]

FINDINGS: Right Kidney:

Renal measurements: 8.3 x 4.5 x 4.2 centimeters = volume: 82 mL.
Echogenic right kidney difficult to delineate (image 6). No right
hydronephrosis or renal mass.

Left Kidney:

Renal measurements: Could not be identified, probably in part due to
echogenic left renal cortex difficult to differentiate from the
retroperitoneal fat.

Bladder:

Diminutive.  Appears normal for degree of bladder distention.
IMPRESSION: 1. Echogenic right kidney compatible with chronic medical renal
disease. Left kidney could not be identified, also probably due to
chronic renal disease.
2. Diminutive and unremarkable urinary bladder.

## 2019-02-08 MED ORDER — FUROSEMIDE 10 MG/ML IJ SOLN
120.0000 mg | Freq: Three times a day (TID) | INTRAVENOUS | Status: DC
  Administered 2019-02-08 – 2019-02-12 (×15): 120 mg via INTRAVENOUS
  Filled 2019-02-08: qty 10
  Filled 2019-02-08 (×2): qty 12
  Filled 2019-02-08: qty 10
  Filled 2019-02-08: qty 12
  Filled 2019-02-08 (×4): qty 10
  Filled 2019-02-08: qty 12
  Filled 2019-02-08 (×2): qty 10
  Filled 2019-02-08 (×6): qty 12

## 2019-02-08 MED ORDER — METOLAZONE 5 MG PO TABS
10.0000 mg | ORAL_TABLET | Freq: Every day | ORAL | Status: DC
  Administered 2019-02-08 – 2019-02-12 (×5): 10 mg via ORAL
  Filled 2019-02-08 (×5): qty 2

## 2019-02-08 NOTE — Progress Notes (Signed)
Progress Note  Patient Name: ALLEE BRUSCA Date of Encounter: 02/08/2019  Primary Cardiologist: Minus Breeding, MD   Subjective   Mild improvement in SOB  Inpatient Medications    Scheduled Meds: . aspirin  81 mg Oral Daily  . gabapentin  100 mg Oral BID  . heparin  5,000 Units Subcutaneous Q8H  . influenza vaccine adjuvanted  0.5 mL Intramuscular Tomorrow-1000  . mouth rinse  15 mL Mouth Rinse BID  . metolazone  10 mg Oral Daily  . metoprolol tartrate  50 mg Oral BID  . sodium bicarbonate  650 mg Oral TID  . sodium chloride flush  3 mL Intravenous Q12H   Continuous Infusions: . sodium chloride    . furosemide 120 mg (02/08/19 1031)   PRN Meds: sodium chloride, acetaminophen, hydrALAZINE, ondansetron (ZOFRAN) IV, polyethylene glycol, senna-docusate, sodium chloride flush, zolpidem   Vital Signs    Vitals:   02/07/19 1835 02/07/19 1958 02/08/19 0500 02/08/19 0645  BP:  (!) 153/63  136/70  Pulse: 87 88  81  Resp:  18  18  Temp:  97.9 F (36.6 C)  98.7 F (37.1 C)  TempSrc:  Oral    SpO2: 100% 99%  100%  Weight:   100.7 kg   Height:        Intake/Output Summary (Last 24 hours) at 02/08/2019 1120 Last data filed at 02/08/2019 0900 Gross per 24 hour  Intake 860 ml  Output 1500 ml  Net -640 ml   Last 3 Weights 02/08/2019 02/07/2019 02/06/2019  Weight (lbs) 222 lb 215 lb 6.2 oz 220 lb 11.2 oz  Weight (kg) 100.699 kg 97.7 kg 100.109 kg      Telemetry    SR - Personally Reviewed  ECG    n/a - Personally Reviewed  Physical Exam   GEN: No acute distress.   Neck: elevated JVD Cardiac: RRR, no murmurs, rubs, or gallops.  Respiratory: crackles bilateral bases GI: Soft, nontender, non-distended  MS: 1+ edema; No deformity. Neuro:  Nonfocal  Psych: Normal affect   Labs    High Sensitivity Troponin:   Recent Labs  Lab 02/05/19 1805 02/06/19 0205 02/06/19 0541  TROPONINIHS 59* 59* 49*      Chemistry Recent Labs  Lab 02/05/19 1805 02/06/19 0312  02/07/19 0342 02/08/19 0401  NA 142 145 145 144  K 5.4* 5.0 5.0 4.8  CL 121*  --  123* 121*  CO2 13*  --  13* 16*  GLUCOSE 103*  --  73 74  BUN 57*  --  64* 65*  CREATININE 3.17*  --  3.36* 3.42*  CALCIUM 7.0*  --  6.9* 6.9*  PROT 7.0  --   --   --   ALBUMIN 2.4*  --   --   --   AST 26  --   --   --   ALT 20  --   --   --   ALKPHOS 166*  --   --   --   BILITOT 0.6  --   --   --   GFRNONAA 14*  --  13* 13*  GFRAA 16*  --  15* 15*  ANIONGAP 8  --  9 7     Hematology Recent Labs  Lab 02/06/19 1104 02/07/19 0342 02/07/19 1552 02/08/19 0401  WBC 13.1* 10.0  --  11.1*  RBC 2.75* 2.58* 2.39* 2.49*  HGB 8.1* 7.5*  --  7.4*  HCT 26.9* 25.1*  --  24.1*  MCV 97.8 97.3  --  96.8  MCH 29.5 29.1  --  29.7  MCHC 30.1 29.9*  --  30.7  RDW 17.0* 17.1*  --  17.1*  PLT 307 293  --  273    BNP Recent Labs  Lab 02/06/19 0313  BNP 1,007.7*     DDimer  Recent Labs  Lab 02/06/19 0205  DDIMER 2.90*     Radiology    Vas [place] Lower Extremity Venous (dvt)  Result Date: 02/08/2019  Lower Venous Study Indications: Edema.  Limitations: Body habitus, poor ultrasound/tissue interface and skin texture. Comparison Study: No prior study Performing Technologist: Sharion Dove RVS  Examination Guidelines: A complete evaluation includes B-mode imaging, spectral Doppler, color Doppler, and power Doppler as needed of all accessible portions of each vessel. Bilateral testing is considered an integral part of a complete examination. Limited examinations for reoccurring indications may be performed as noted.  Right Technical Findings: Right leg not evaluated.  +---------+---------------+---------+-----------+----------+-------------------+ LEFT     CompressibilityPhasicitySpontaneityPropertiesThrombus Aging      +---------+---------------+---------+-----------+----------+-------------------+ CFV                                                   pulsatile            +---------+---------------+---------+-----------+----------+-------------------+ FV Prox                 Yes      Yes                  patent by color and                                                       Doppler             +---------+---------------+---------+-----------+----------+-------------------+ FV Mid                  Yes      Yes                  patent by color and                                                       Doppler             +---------+---------------+---------+-----------+----------+-------------------+ FV Distal               Yes      Yes                  patent by color and                                                       Doppler             +---------+---------------+---------+-----------+----------+-------------------+ PFV  Not visualized      +---------+---------------+---------+-----------+----------+-------------------+ POP      Full                                         pulsatile           +---------+---------------+---------+-----------+----------+-------------------+ PTV                                                   Not visualized      +---------+---------------+---------+-----------+----------+-------------------+ PERO                                                  Not visualized      +---------+---------------+---------+-----------+----------+-------------------+   Left Technical Findings: Not visualized segments include profunda, posterior tibial, peroenal veins.   Summary: Left: There is no evidence of deep vein thrombosis in the lower extremity. However, portions of this examination were limited- see technologist comments above.  *See table(s) above for measurements and observations. Electronically signed by Curt Jews MD on 02/08/2019 at 9:03:45 AM.    Final     Cardiac Studies    Patient Profile     FRANNY DEBAKER is a 73 y.o. female  with a hx of chronic diastolic CHF, previously moderate (now severe) pumonary HTN, CKD IV, anemia of chronic disease, HTN, remote paroxysmal atrial fibrillation, PAT, COPD, PAD (s/p femoral to below knee popliteal bypass then L AKA) who is being seen today for the evaluation of CHF at the request of Dr. [name].  Assessment & Plan    1. Acute on chronic diastolic HF - Echo this admit LVEF [id number], indet diastolic function, RV pressure and volume overload, mild RV dysfunction, severe RAE, severe pulm HTN PASP 109 mmHg.  - negative 221mL yesterday. Lasix was increased to 80mg  bid yesterday starting with evening dose. Uptrendin Cr.  - RV was moderately enlarged, TAPSE 2.2 and RV s' essentially 10 suggesting normal systolic function. Severe pulm HTN PASP 110. If ongoing issues with diuresis may be worth having CHF team see, unclear how much RV could be affecting diuresis vs just significant progression of her undelrying kidney disease.  - we will defer diuretic dosing to nephrology  - follow diuretic response from nephrologies changes today, f/u initial renal workup. Home diuretic changes alone can get her volume moving.  Pending results may consider CHF eval tomorrow, unclear how much RV could be affecting diuresis vs just significant progression of her undelrying kidney disease.     2. AKI on CKD Cr last year 05/2017 2.09, on admit 3.17. Up to 3.4 today   3. Severe pulmonary hypertension - I suspect likely related to left sided heart disease and significant volume overload. Would repeat echo once euvolemic to reassess, if persistent could consider further workup.     4. PAF - isolated episode some years ago. Recurent episode this admission noted on tele, transient - with anemia issues would not start anticoag at this time.  - currently SR.     For questions or updates, please contact [place] Please consult [email or web address] for contact info under  Merrily Pew, MD   02/08/2019, 11:20 AM

## 2019-02-08 NOTE — Progress Notes (Signed)
[facility] Progress Note  Subjective: patient has no c/o's.  I/o equal, 1.5L off yest.   Vitals:   02/07/19 1835 02/07/19 1958 02/08/19 0500 02/08/19 0645  BP:  (!) 153/63  136/70  Pulse: 87 88  81  Resp:  18  18  Temp:  97.9 F (36.6 C)  98.7 F (37.1 C)  TempSrc:  Oral    SpO2: 100% 99%  100%  Weight:   100.7 kg   Height:        Inpatient medications: . aspirin  81 mg Oral Daily  . gabapentin  100 mg Oral BID  . heparin  5,000 Units Subcutaneous Q8H  . influenza vaccine adjuvanted  0.5 mL Intramuscular Tomorrow-1000  . mouth rinse  15 mL Mouth Rinse BID  . metolazone  10 mg Oral Daily  . metoprolol tartrate  50 mg Oral BID  . sodium bicarbonate  650 mg Oral TID  . sodium chloride flush  3 mL Intravenous Q12H   . sodium chloride    . furosemide 120 mg (02/08/19 1031)   sodium chloride, acetaminophen, hydrALAZINE, ondansetron (ZOFRAN) IV, polyethylene glycol, senna-docusate, sodium chloride flush, zolpidem    Exam: Gen elderly pleasant AAF, obese, nasal O2 Sclera anicteric, throat clear  +JVD Chest mostly clear, occ rales , no wheezing, no ^wob RRR no MRG Abd soft ntnd no mass or ascites +bs GU purewick draining pale yellow urine Ext L AKA, diffuse doughy 2-3+ LE's/ dependent areas > distal, scaly skin R lower leg Neuro is alert, Ox 3 , nf    Home meds:  - amlodipine 10/ metoprolol 50 bid  - gabapentin 100 bid  - aspirin 81      Date             Creat               eGFR  2009- 10         1.93- 3.11  2013               1.46- 2.97          2017               1.66- 2.79        16- 30  Feb 2019        2.06                 23  02/05/2019       3.17                 16, stage IV  02/07/2019       3.36                 15, stage IV-V    Na 145 K 5.0  CO2 13  BUN 64  Cr 3.36  Mg 1.0 Ca 6.9  Alb 2.4   WBC 10k  Hb 7.5  plt 293   CXR 11/5 > Cardiomegaly and pulmonary vascular congestion without overt edema  ECHO - LVEF 60%, RV pressure/ vol  overload, RV slightly reduced fxn. RA severely dilated, severe ^RV sys 167m Hg. IVC dilated w/ low resp variability suggesting RA pressure 137mHg.   Assessment/ Plan: 1. Renal failure - since 2013 - Feb 2019 patient has had mostly CKD IV disease w/ creat 1.6- 2.5.  Now creat 3's. Suspect AKI due to decomp CHF vs. progression of chronic kidney disease, or other. No uremic symptoms.  Prob has  HTN'sive disease, but will need UA and renal US for initial w/u. Diuresing but has marked edema , will ^lasix 120 tid and add po zaroxlyn 10 qd. She said she will f/u with [facility] after dc.  Will follow.  2. Pulm edema - w/ SOB, vol overload, LE edema. Diuresing as above 3. HTN - cont meds BP's reasonably well controlled 4. Met acidosis - po bicarb started  5. SP L AKA 6. Obesity 7. Pulm HTN 8. COPD 9. Chron diast HF     Rob Anjoli Diemer 02/08/2019, 11:05 AM  Iron/TIBC/Ferritin/ %Sat    Component Value Date/Time   IRON 27 (L) 02/08/2019 0401   TIBC 165 (L) 02/08/2019 0401   FERRITIN 73 02/08/2019 0401   IRONPCTSAT 16 02/08/2019 0401   Recent Labs  Lab 02/05/19 1805  02/08/19 0401  NA 142   < > 144  K 5.4*   < > 4.8  CL 121*   < > 121*  CO2 13*   < > 16*  GLUCOSE 103*   < > 74  BUN 57*   < > 65*  CREATININE 3.17*   < > 3.42*  CALCIUM 7.0*   < > 6.9*  ALBUMIN 2.4*  --   --    < > = values in this interval not displayed.   Recent Labs  Lab 02/05/19 1805  AST 26  ALT 20  ALKPHOS 166*  BILITOT 0.6  PROT 7.0   Recent Labs  Lab 02/08/19 0401  WBC 11.1*  HGB 7.4*  HCT 24.1*  PLT 273

## 2019-02-08 NOTE — Progress Notes (Addendum)
PROGRESS NOTE    Stephanie Griffith  [id number] DOB: February 27, 1946 DOA: 02/05/2019 PCP: [name], MD   Brief Narrative:  73 y.o. female with medical history significant for chronic kidney disease stage IV, peripheral vascular disease status post left AKA, hypertension, chronic anemia, and chronic diastolic CHF, now presenting to the emergency department with 1 week of progressive leg swelling and shortness of breath.   Found to be in CHF exacerbation complicated by CKD.   Assessment & Plan:   Principal Problem:   Acute on chronic diastolic CHF (congestive heart failure) (HCC) Active Problems:   Normocytic anemia   Essential hypertension, benign   Hyperkalemia   Chronic renal insufficiency, stage IV (severe) (HCC)   Metabolic acidosis  Acute on chronic congestive heart failure with preserved ejection fraction, class III -Plans to wean off oxygen.  -Echocardiogram 11/6-60 to 65%, right ventricular volume and pressure overload. -Consulted cardiology; appreciate Cardio input  - S/P Lasix 32mh BID on 11/7; I/O: ~276ml - Changed to 120mg  3 times daily per Nephrology; appreciate Nephro care  - Cont daily ins and outs monitoring, fluid restriction to 1.5L, salt restriction to less than 2 g a day -Monitor electrolytes and replete as necessary -Wean off oxygen  Acute kidney injury on chronic kidney disease stage IV -Baseline 2.0.  Admission creatinine 3.1, trended up to 3.36.  Wonder if this is her new baseline but we will continue to monitor.  -Latest FeNA of 11.1% which predicts to be post renal cause -Renal ultrasound pending so far - Appreciated renal input  Essential hypertension -Getting aggressive diuretics.  Will adjust cardiac regimen as appropriate  Anemia of chronic disease -Baseline hemoglobin 8.0.  Continue to closely monitor.  Elevated D-dimer -Lower extremity Dopplers done-negative.  Unable to perform CTA chest given renal dysfunction.  Low suspicion for PE.  DVT  prophylaxis: Subcutaneous heparin Code Status: Full code Family Communication: None at bedside Disposition Plan: Maintain hospital stay for diuresis with caution while monitoring renal function.  Consultants:   Cardiology  Procedures:   None  Antimicrobials:   None   Subjective: Feels slightly better than yesterday.  Still having some exertional shortness of breath.  Does not use any oxygen at home.  Review of Systems Otherwise negative except as per HPI, including: General: Denies fever, chills, night sweats or unintended weight loss. Resp: Denies cough, wheezing Cardiac: Denies chest pain, palpitations, orthopnea, paroxysmal nocturnal dyspnea. GI: Denies abdominal pain, nausea, vomiting, diarrhea or constipation GU: Denies dysuria, frequency, hesitancy or incontinence MS: Denies muscle aches, joint pain or swelling Neuro: Denies headache, neurologic deficits (focal weakness, numbness, tingling), abnormal gait Psych: Denies anxiety, depression, SI/HI/AVH Skin: Denies new rashes or lesions ID: Denies sick contacts, exotic exposures, travel  Objective: Vitals:   02/07/19 1835 02/07/19 1958 02/08/19 0500 02/08/19 0645  BP:  (!) 153/63  136/70  Pulse: 87 88  81  Resp:  18  18  Temp:  97.9 F (36.6 C)  98.7 F (37.1 C)  TempSrc:  Oral    SpO2: 100% 99%  100%  Weight:   100.7 kg   Height:        Intake/Output Summary (Last 24 hours) at 02/08/2019 1343 Last data filed at 02/08/2019 0900 Gross per 24 hour  Intake 680 ml  Output 1500 ml  Net -820 ml   Filed Weights   02/06/19 1058 02/07/19 0530 02/08/19 0500  Weight: 100.1 kg 97.7 kg 100.7 kg    Examination:  General exam: Appears calm and comfortable,  2 L nasal cannula Respiratory system: Bilateral crackles at the bases Cardiovascular system: S1 & S2 heard, RRR. No JVD, murmurs, rubs, gallops or clicks. No pedal edema. Gastrointestinal system: Abdomen is nondistended, soft and nontender. No organomegaly or  masses felt. Normal bowel sounds heard. Central nervous system: Alert and oriented. No focal neurological deficits. Extremities: Symmetric 5 x 5 power. Skin: No rashes, lesions or ulcers Psychiatry: Judgement and insight appear normal. Mood & affect appropriate.     Data Reviewed:   CBC: Recent Labs  Lab 02/05/19 1805 02/06/19 0312 02/06/19 1104 02/07/19 0342 02/08/19 0401  WBC 12.4*  --  13.1* 10.0 11.1*  NEUTROABS 9.0*  --  10.7*  --   --   HGB 8.4* 7.5* 8.1* 7.5* 7.4*  HCT 27.9* 22.0* 26.9* 25.1* 24.1*  MCV 99.3  --  97.8 97.3 96.8  PLT 342  --  307 293 [id number]   Basic Metabolic Panel: Recent Labs  Lab 02/05/19 1805 02/06/19 0312 02/06/19 1104 02/07/19 0342 02/08/19 0401  NA 142 145  --  145 144  K 5.4* 5.0  --  5.0 4.8  CL 121*  --   --  123* 121*  CO2 13*  --   --  13* 16*  GLUCOSE 103*  --   --  73 74  BUN 57*  --   --  64* 65*  CREATININE 3.17*  --   --  3.36* 3.42*  CALCIUM 7.0*  --   --  6.9* 6.9*  MG  --   --  1.0* 1.0* 1.4*   GFR: Estimated Creatinine Clearance: 18.2 mL/min (A) (by C-G formula based on SCr of 3.42 mg/dL (H)). Liver Function Tests: Recent Labs  Lab 02/05/19 1805  AST 26  ALT 20  ALKPHOS 166*  BILITOT 0.6  PROT 7.0  ALBUMIN 2.4*   No results for input(s): LIPASE, AMYLASE in the last 168 hours. No results for input(s): AMMONIA in the last 168 hours. Coagulation Profile: No results for input(s): INR, PROTIME in the last 168 hours. Cardiac Enzymes: No results for input(s): CKTOTAL, CKMB, CKMBINDEX, TROPONINI in the last 168 hours. BNP (last 3 results) No results for input(s): PROBNP in the last 8760 hours. HbA1C: No results for input(s): HGBA1C in the last 72 hours. CBG: No results for input(s): GLUCAP in the last 168 hours. Lipid Profile: No results for input(s): CHOL, HDL, LDLCALC, TRIG, CHOLHDL, LDLDIRECT in the last 72 hours. Thyroid Function Tests: Recent Labs    02/07/19 1552  TSH 1.910   Anemia Panel: Recent Labs     02/07/19 1552 02/08/19 0401  VITAMINB12 164* 179*  FOLATE 14.1  --   FERRITIN 75 73  TIBC 151* 165*  IRON 29 27*  RETICCTPCT 4.3*  --    Sepsis Labs: No results for input(s): PROCALCITON, LATICACIDVEN in the last 168 hours.  Recent Results (from the past 240 hour(s))  SARS CORONAVIRUS 2 (TAT 6-24 HRS) Nasopharyngeal Nasopharyngeal Swab     Status: None   Collection Time: 02/06/19  4:36 AM   Specimen: Nasopharyngeal Swab  Result Value Ref Range Status   SARS Coronavirus 2 NEGATIVE NEGATIVE Final    Comment: (NOTE) SARS-CoV-2 target nucleic acids are NOT DETECTED. The SARS-CoV-2 RNA is generally detectable in upper and lower respiratory specimens during the acute phase of infection. Negative results do not preclude SARS-CoV-2 infection, do not rule out co-infections with other pathogens, and should not be used as the sole basis for treatment or other patient management  decisions. Negative results must be combined with clinical observations, patient history, and epidemiological information. The expected result is Negative. Fact Sheet for Patients: SugarRoll.be Fact Sheet for Healthcare Providers: https://www.woods-mathews.com/ This test is not yet approved or cleared by the [place] FDA and  has been authorized for detection and/or diagnosis of SARS-CoV-2 by FDA under an Emergency Use Authorization (EUA). This EUA will remain  in effect (meaning this test can be used) for the duration of the COVID-19 declaration under Section 56 4(b)(1) of the Act, 21 U.S.C. section 360bbb-3(b)(1), unless the authorization is terminated or revoked sooner. Performed at [facility], [place] [address], [place], [place] [address]          Radiology Studies: Vas [place] Lower Extremity Venous (dvt)  Result Date: 02/08/2019  Lower Venous Study Indications: Edema.  Limitations: Body habitus, poor ultrasound/tissue interface and skin texture.  Comparison Study: No prior study Performing Technologist: Sharion Dove RVS  Examination Guidelines: A complete evaluation includes B-mode imaging, spectral Doppler, color Doppler, and power Doppler as needed of all accessible portions of each vessel. Bilateral testing is considered an integral part of a complete examination. Limited examinations for reoccurring indications may be performed as noted.  Right Technical Findings: Right leg not evaluated.  +---------+---------------+---------+-----------+----------+-------------------+ LEFT     CompressibilityPhasicitySpontaneityPropertiesThrombus Aging      +---------+---------------+---------+-----------+----------+-------------------+ CFV                                                   pulsatile           +---------+---------------+---------+-----------+----------+-------------------+ FV Prox                 Yes      Yes                  patent by color and                                                       Doppler             +---------+---------------+---------+-----------+----------+-------------------+ FV Mid                  Yes      Yes                  patent by color and                                                       Doppler             +---------+---------------+---------+-----------+----------+-------------------+ FV Distal               Yes      Yes                  patent by color and  Doppler             +---------+---------------+---------+-----------+----------+-------------------+ PFV                                                   Not visualized      +---------+---------------+---------+-----------+----------+-------------------+ POP      Full                                         pulsatile           +---------+---------------+---------+-----------+----------+-------------------+ PTV                                                    Not visualized      +---------+---------------+---------+-----------+----------+-------------------+ PERO                                                  Not visualized      +---------+---------------+---------+-----------+----------+-------------------+   Left Technical Findings: Not visualized segments include profunda, posterior tibial, peroenal veins.   Summary: Left: There is no evidence of deep vein thrombosis in the lower extremity. However, portions of this examination were limited- see technologist comments above.  *See table(s) above for measurements and observations. Electronically signed by Curt Jews MD on 02/08/2019 at 9:03:45 AM.    Final         Scheduled Meds: . aspirin  81 mg Oral Daily  . gabapentin  100 mg Oral BID  . heparin  5,000 Units Subcutaneous Q8H  . influenza vaccine adjuvanted  0.5 mL Intramuscular Tomorrow-1000  . mouth rinse  15 mL Mouth Rinse BID  . metolazone  10 mg Oral Daily  . metoprolol tartrate  50 mg Oral BID  . sodium bicarbonate  650 mg Oral TID  . sodium chloride flush  3 mL Intravenous Q12H   Continuous Infusions: . sodium chloride    . furosemide 120 mg (02/08/19 1031)     LOS: 2 days   Time spent= 35 mins    Thornell Mule, MD [facility]  If 7PM-7AM, please contact night-coverage  02/08/2019, 1:43 PM

## 2019-02-09 ENCOUNTER — Encounter (HOSPITAL_COMMUNITY): Payer: Self-pay | Admitting: General Practice

## 2019-02-09 DIAGNOSIS — N179 Acute kidney failure, unspecified: Secondary | ICD-10-CM

## 2019-02-09 DIAGNOSIS — N184 Chronic kidney disease, stage 4 (severe): Secondary | ICD-10-CM

## 2019-02-09 DIAGNOSIS — I10 Essential (primary) hypertension: Secondary | ICD-10-CM

## 2019-02-09 LAB — BASIC METABOLIC PANEL
Anion gap: 9 (ref 5–15)
BUN: 73 mg/dL — ABNORMAL HIGH (ref 8–23)
CO2: 16 mmol/L — ABNORMAL LOW (ref 22–32)
Calcium: 7.3 mg/dL — ABNORMAL LOW (ref 8.9–10.3)
Chloride: 119 mmol/L — ABNORMAL HIGH (ref 98–111)
Creatinine, Ser: 3.74 mg/dL — ABNORMAL HIGH (ref 0.44–1.00)
GFR calc Af Amer: 13 mL/min — ABNORMAL LOW (ref >60)
GFR calc non Af Amer: 11 mL/min — ABNORMAL LOW (ref >60)
Glucose, Bld: 76 mg/dL (ref 70–99)
Potassium: 5.1 mmol/L (ref 3.5–5.1)
Sodium: 144 mmol/L (ref 135–145)

## 2019-02-09 LAB — FOLATE RBC
Folate, Hemolysate: 245 ng/mL
Folate, RBC: 1061 ng/mL (ref >498)
Hematocrit: 23.1 % — ABNORMAL LOW (ref 34.0–46.6)

## 2019-02-09 LAB — CBC
HCT: 23.2 % — ABNORMAL LOW (ref 36.0–46.0)
Hemoglobin: 7 g/dL — ABNORMAL LOW (ref 12.0–15.0)
MCH: 29.4 pg (ref 26.0–34.0)
MCHC: 30.2 g/dL (ref 30.0–36.0)
MCV: 97.5 fL (ref 80.0–100.0)
Platelets: 276 10*3/uL (ref 150–400)
RBC: 2.38 MIL/uL — ABNORMAL LOW (ref 3.87–5.11)
RDW: 16.8 % — ABNORMAL HIGH (ref 11.5–15.5)
WBC: 8.1 10*3/uL (ref 4.0–10.5)
nRBC: 0.2 % (ref 0.0–0.2)

## 2019-02-09 LAB — MAGNESIUM: Magnesium: 1.3 mg/dL — ABNORMAL LOW (ref 1.7–2.4)

## 2019-02-09 NOTE — Progress Notes (Signed)
[facility] Progress Note  Subjective: patient has no c/o's.  1.3 L negative, 2 L urine output yesterday, weights down 5 kg?  On 3 times daily Lasix and metolazone.  Renal ultrasound with 8.3 cm right and left kidney not visualized likely due to his increased echogenicity; CT 2017, left kidney was present.  Vitals:   02/09/19 0500 02/09/19 0506 02/09/19 0913 02/09/19 1210  BP:  (!) 160/85 (!) 150/67 140/68  Pulse:  85  81  Resp:  18 20 20   Temp:  98.5 F (36.9 C)  98.6 F (37 C)  TempSrc:  Oral  Oral  SpO2:  100%  100%  Weight: 95.6 kg     Height:        Inpatient medications: . aspirin  81 mg Oral Daily  . gabapentin  100 mg Oral BID  . heparin  5,000 Units Subcutaneous Q8H  . influenza vaccine adjuvanted  0.5 mL Intramuscular Tomorrow-1000  . mouth rinse  15 mL Mouth Rinse BID  . metolazone  10 mg Oral Daily  . metoprolol tartrate  50 mg Oral BID  . sodium bicarbonate  650 mg Oral TID  . sodium chloride flush  3 mL Intravenous Q12H   . sodium chloride    . furosemide 120 mg (02/09/19 0519)   sodium chloride, acetaminophen, hydrALAZINE, ondansetron (ZOFRAN) IV, polyethylene glycol, senna-docusate, sodium chloride flush, zolpidem    Exam: Gen  NAD, pleasant, on nasal cannula +JVD Chest mostly clear, occ rales , no wheezing, no ^wob RRR no MRG Soft, nontender GU purewick draining pale yellow urine Left AKA, right lower extremity with scaling and hyperpigmentation consistent with chronic venous stasis Neuro is alert, Ox 3 , nf    Home meds:  - amlodipine 10/ metoprolol 50 bid  - gabapentin 100 bid  - aspirin 81      Date             Creat               eGFR  2009- 10         1.93- 3.11  2013               1.46- 2.97          2017               1.66- 2.79        16- 30  Feb 2019        2.06                 23  02/05/2019       3.17                 16, stage IV  02/07/2019       3.36                 15, stage IV-V    Na 145 K 5.0  CO2 13   BUN 64  Cr 3.36  Mg 1.0 Ca 6.9  Alb 2.4   WBC 10k  Hb 7.5  plt 293   CXR 11/5 > Cardiomegaly and pulmonary vascular congestion without overt edema  ECHO - LVEF 60%, RV pressure/ vol overload, RV slightly reduced fxn. RA severely dilated, severe ^RV sys 169m Hg. IVC dilated w/ low resp variability suggesting RA pressure 144mHg.   Assessment/ Plan: 1. Renal failure - since 2013 - Feb 2019 patient has had mostly CKD IV disease w/ creat  1.6- 2.5.  Now creat 3's.  Favor progressive CKD, could have a component of AKI.Marland Kitchen No uremic symptoms.    Slowly diuresing, continue lasix 120 tid and add po zaroxlyn 10 qd.  2. Pulm edema - w/ SOB, vol overload, LE edema. Diuresing as above 3. HTN - cont meds BP's reasonably well controlled 4. Met acidosis - po bicarb started, folow 5. SP L AKA 6. Obesity 7. Pulm HTN 8. COPD 9. Chron diast HF, and likely right-sided heart failure, per cardiology 10. Anemia, transfuse as indicated  Rexene Agent  02/09/2019, 2:56 PM  Iron/TIBC/Ferritin/ %Sat    Component Value Date/Time   IRON 27 (L) 02/08/2019 0401   TIBC 165 (L) 02/08/2019 0401   FERRITIN 73 02/08/2019 0401   IRONPCTSAT 16 02/08/2019 0401   Recent Labs  Lab 02/05/19 1805  02/09/19 0435  NA 142   < > 144  K 5.4*   < > 5.1  CL 121*   < > 119*  CO2 13*   < > 16*  GLUCOSE 103*   < > 76  BUN 57*   < > 73*  CREATININE 3.17*   < > 3.74*  CALCIUM 7.0*   < > 7.3*  ALBUMIN 2.4*  --   --    < > = values in this interval not displayed.   Recent Labs  Lab 02/05/19 1805  AST 26  ALT 20  ALKPHOS 166*  BILITOT 0.6  PROT 7.0   Recent Labs  Lab 02/09/19 0435  WBC 8.1  HGB 7.0*  HCT 23.2*  PLT 276

## 2019-02-09 NOTE — Progress Notes (Signed)
Progress Note  Patient Name: Stephanie Griffith Date of Encounter: 02/09/2019  Primary Cardiologist: Minus Breeding, MD   Subjective   No chest pain but some SOB, has to have her head elevated.   Inpatient Medications    Scheduled Meds:  aspirin  81 mg Oral Daily   gabapentin  100 mg Oral BID   heparin  5,000 Units Subcutaneous Q8H   influenza vaccine adjuvanted  0.5 mL Intramuscular Tomorrow-1000   mouth rinse  15 mL Mouth Rinse BID   metolazone  10 mg Oral Daily   metoprolol tartrate  50 mg Oral BID   sodium bicarbonate  650 mg Oral TID   sodium chloride flush  3 mL Intravenous Q12H   Continuous Infusions:  sodium chloride     furosemide 120 mg (02/09/19 0519)   PRN Meds: sodium chloride, acetaminophen, hydrALAZINE, ondansetron (ZOFRAN) IV, polyethylene glycol, senna-docusate, sodium chloride flush, zolpidem   Vital Signs    Vitals:   02/08/19 1351 02/08/19 2045 02/09/19 0500 02/09/19 0506  BP: (!) 170/79 (!) 154/63  (!) 160/85  Pulse: 87 90  85  Resp: 20 18  18   Temp: 98.1 F (36.7 C) 98.3 F (36.8 C)  98.5 F (36.9 C)  TempSrc: Oral Oral  Oral  SpO2: 99% 99%  100%  Weight:   95.6 kg   Height:        Intake/Output Summary (Last 24 hours) at 02/09/2019 0833 Last data filed at 02/09/2019 0809 Gross per 24 hour  Intake 1022 ml  Output 2000 ml  Net -978 ml   Last 3 Weights 02/09/2019 02/08/2019 02/07/2019  Weight (lbs) 210 lb 12.2 oz 222 lb 215 lb 6.2 oz  Weight (kg) 95.6 kg 100.699 kg 97.7 kg      Telemetry    SR  - Personally Reviewed  ECG    No new - Personally Reviewed  Physical Exam   GEN: No acute distress.   Neck: No JVD Cardiac: RRR, no murmurs, rubs, or gallops.  Respiratory: rales and thonchi to auscultation bilaterally. GI: Soft, nontender, non-distended  MS: 1+ edema of Lower Rt leg and tight and edema in posterior thigh ; Lt AKA Neuro:  Nonfocal  Psych: Normal affect   Labs    High Sensitivity Troponin:   Recent Labs   Lab 02/05/19 1805 02/06/19 0205 02/06/19 0541  TROPONINIHS 59* 59* 49*      Chemistry Recent Labs  Lab 02/05/19 1805  02/07/19 0342 02/08/19 0401 02/09/19 0435  NA 142   < > 145 144 144  K 5.4*   < > 5.0 4.8 5.1  CL 121*  --  123* 121* 119*  CO2 13*  --  13* 16* 16*  GLUCOSE 103*  --  73 74 76  BUN 57*  --  64* 65* 73*  CREATININE 3.17*  --  3.36* 3.42* 3.74*  CALCIUM 7.0*  --  6.9* 6.9* 7.3*  PROT 7.0  --   --   --   --   ALBUMIN 2.4*  --   --   --   --   AST 26  --   --   --   --   ALT 20  --   --   --   --   ALKPHOS 166*  --   --   --   --   BILITOT 0.6  --   --   --   --   GFRNONAA 14*  --  13* 13*  11*  GFRAA 16*  --  15* 15* 13*  ANIONGAP 8  --  9 7 9    < > = values in this interval not displayed.     Hematology Recent Labs  Lab 02/07/19 0342 02/07/19 1552 02/08/19 0401 02/09/19 0435  WBC 10.0  --  11.1* 8.1  RBC 2.58* 2.39* 2.49* 2.38*  HGB 7.5*  --  7.4* 7.0*  HCT 25.1*  --  24.1* 23.2*  MCV 97.3  --  96.8 97.5  MCH 29.1  --  29.7 29.4  MCHC 29.9*  --  30.7 30.2  RDW 17.1*  --  17.1* 16.8*  PLT 293  --  273 276    BNP Recent Labs  Lab 02/06/19 0313  BNP 1,007.7*     DDimer  Recent Labs  Lab 02/06/19 0205  DDIMER 2.90*     Radiology    US Renal  Result Date: 02/08/2019 CLINICAL DATA:  72 year old female with stage 4 chronic kidney disease. EXAM: RENAL / URINARY TRACT ULTRASOUND COMPLETE COMPARISON:  CT Abdomen and Pelvis 12/30/2015. FINDINGS: Right Kidney: Renal measurements: 8.3 x 4.5 x 4.2 centimeters = volume: 82 mL. Echogenic right kidney difficult to delineate (image 6). No right hydronephrosis or renal mass. Left Kidney: Renal measurements: Could not be identified, probably in part due to echogenic left renal cortex difficult to differentiate from the retroperitoneal fat. Bladder: Diminutive.  Appears normal for degree of bladder distention. IMPRESSION: 1. Echogenic right kidney compatible with chronic medical renal disease. Left  kidney could not be identified, also probably due to chronic renal disease. 2. Diminutive and unremarkable urinary bladder. Electronically Signed   By: Genevie Ann M.D.   On: 02/08/2019 21:59   Vas [place] Lower Extremity Venous (dvt)  Result Date: 02/08/2019  Lower Venous Study Indications: Edema.  Limitations: Body habitus, poor ultrasound/tissue interface and skin texture. Comparison Study: No prior study Performing Technologist: Sharion Dove RVS  Examination Guidelines: A complete evaluation includes B-mode imaging, spectral Doppler, color Doppler, and power Doppler as needed of all accessible portions of each vessel. Bilateral testing is considered an integral part of a complete examination. Limited examinations for reoccurring indications may be performed as noted.  Right Technical Findings: Right leg not evaluated.  +---------+---------------+---------+-----------+----------+-------------------+  LEFT      Compressibility Phasicity Spontaneity Properties Thrombus Aging       +---------+---------------+---------+-----------+----------+-------------------+  CFV                                                        pulsatile            +---------+---------------+---------+-----------+----------+-------------------+  FV Prox                   Yes       Yes                    patent by color and                                                              Doppler              +---------+---------------+---------+-----------+----------+-------------------+  FV Mid                    Yes       Yes                    patent by color and                                                              Doppler              +---------+---------------+---------+-----------+----------+-------------------+  FV Distal                 Yes       Yes                    patent by color and                                                              Doppler               +---------+---------------+---------+-----------+----------+-------------------+  PFV                                                        Not visualized       +---------+---------------+---------+-----------+----------+-------------------+  POP       Full                                             pulsatile            +---------+---------------+---------+-----------+----------+-------------------+  PTV                                                        Not visualized       +---------+---------------+---------+-----------+----------+-------------------+  PERO                                                       Not visualized       +---------+---------------+---------+-----------+----------+-------------------+   Left Technical Findings: Not visualized segments include profunda, posterior tibial, peroenal veins.   Summary: Left: There is no evidence of deep vein thrombosis in the lower extremity. However, portions of this examination were limited- see technologist comments above.  *See table(s) above for measurements and observations. Electronically signed by Curt Jews MD on 02/08/2019 at 9:03:45 AM.    Final     Cardiac Studies   Echo 02/06/19  IMPRESSIONS    1. Left ventricular  ejection fraction, by visual estimation, is 60 to 65%. The left ventricle has normal function. There is moderately increased left ventricular hypertrophy.  2. Left ventricular diastolic parameters are indeterminate.  3. Right ventricular volume and pressure overload.  4. Global right ventricle has mildly reduced systolic function.The right ventricular size is moderately enlarged.  5. Right atrial size was severely dilated.  6. The mitral valve is normal in structure. No evidence of mitral valve regurgitation.  7. The tricuspid valve is normal in structure. Tricuspid valve regurgitation mild-moderate.  8. The aortic valve is tricuspid. Aortic valve regurgitation is mild.  9. Severely elevated pulmonary artery  systolic pressure. 10. The tricuspid regurgitant velocity is 4.86 m/s, and with an assumed right atrial pressure of 15 mmHg, the estimated right ventricular systolic pressure is severely elevated at 109.5 mmHg. 11. The inferior vena cava is dilated in size with <50% respiratory variability, suggesting right atrial pressure of 15 mmHg.  FINDINGS  Left Ventricle: Left ventricular ejection fraction, by visual estimation, is 60 to 65%. The left ventricle has normal function. The left ventricular internal cavity size was the left ventricle is normal in size. There is moderately increased left  ventricular hypertrophy. Concentric left ventricular hypertrophy. The interventricular septum is flattened in systole and diastole, consistent with right ventricular pressure and volume overload. Left ventricular diastolic parameters are indeterminate.  Right Ventricle: The right ventricular size is moderately enlarged. Right vetricular wall thickness was not assessed. Global RV systolic function is has mildly reduced systolic function. The tricuspid regurgitant velocity is 4.86 m/s, and with an assumed  right atrial pressure of 15 mmHg, the estimated right ventricular systolic pressure is severely elevated at 109.5 mmHg.  Left Atrium: Left atrial size was normal in size.  Right Atrium: Right atrial size was severely dilated  Pericardium: Trivial pericardial effusion is present.  Mitral Valve: The mitral valve is normal in structure. No evidence of mitral valve regurgitation.  Tricuspid Valve: The tricuspid valve is normal in structure. Tricuspid valve regurgitation mild-moderate.  Aortic Valve: The aortic valve is tricuspid. Aortic valve regurgitation is mild.  Pulmonic Valve: The pulmonic valve was not assessed. Pulmonic valve regurgitation is not visualized.  Aorta: The aortic root is normal in size and structure.  Venous: The inferior vena cava is dilated in size with less than 50%  respiratory variability, suggesting right atrial pressure of 15 mmHg.  IAS/Shunts: The interatrial septum was not well visualized.     LEFT VENTRICLE PLAX 2D LVIDd:         3.40 cm  Diastology LVIDs:         2.40 cm  LV e' lateral:   7.94 cm/s LV PW:         1.40 cm  LV E/e' lateral: 10.7 LV IVS:        1.50 cm  LV e' medial:    5.55 cm/s LVOT diam:     2.00 cm  LV E/e' medial:  15.4 LV SV:         27 ml LV SV Index:   12.18 LVOT Area:     3.14 cm    RIGHT VENTRICLE RV Basal diam:  3.60 cm RV S prime:     9.57 cm/s TAPSE (M-mode): 2.2 cm  LEFT ATRIUM             Index       RIGHT ATRIUM           Index LA diam:  4.50 cm 2.10 cm/m  RA Area:     29.70 cm LA Vol (A2C):   60.3 ml 28.17 ml/m RA Volume:   117.00 ml 54.66 ml/m LA Vol (A4C):   45.1 ml 21.07 ml/m LA Biplane Vol: 53.9 ml 25.18 ml/m  AORTIC VALVE LVOT Vmax:   112.00 cm/s LVOT Vmean:  61.700 cm/s LVOT VTI:    0.214 m   AORTA Ao Root diam: 3.00 cm  MITRAL VALVE                         TRICUSPID VALVE MV Area (PHT): 4.80 cm              TR Peak grad:   94.5 mmHg MV PHT:        45.82 msec            TR Vmax:        486.00 cm/s MV Decel Time: 158 msec MV E velocity: 85.30 cm/s  103 cm/s  SHUNTS MV A velocity: 103.00 cm/s 70.3 cm/s Systemic VTI:  0.21 m MV E/A ratio:  0.83        1.5       Systemic Diam: 2.00 cm   Patient Profile     73 y.o. female with a hx of chronic diastolic CHF, previously moderate (now severe) pumonary HTN, CKD IV, anemia of chronic disease, HTN, remote paroxysmal atrial fibrillation, PAT, COPD, PAD (s/pfemoral to below knee popliteal bypassthen L AKA, now admitted with CHF.   Assessment & Plan    1. Acute on chronic diastolic HF - Echo this admit LVEF [id number], indet diastolic function, RV pressure and volume overload, mild RV dysfunction, severe RAE, severe pulm HTN PASP 109 mmHg.  - negative 985 mL since admit. Lasix was increased to 80mg  bid yesterday starting with  evening dose. Uptrendin Cr.  3.36>>3.42>>3.74  - RV was moderately enlarged, TAPSE 2.2 and RV s' essentially 10 suggesting normal systolic function. Severe pulm HTN PASP 110. If ongoing issues with diuresis may be worth having CHF team see, unclear how much RV could be affecting diuresis vs just significant progression of her undelrying kidney disease.  - we will defer diuretic dosing to nephrology  Yesterday lasix increased to 120 mg TID and zaroxolyn 10   - follow diuretic response from nephrologies changes today, f/u initial renal workup. Home diuretic changes alone can get her volume moving.  Pending results may consider CHF eval tomorrow, unclear how much RV could be affecting diuresis vs just significant progression of her undelrying kidney disease.     2. AKI on CKD with et. Acidosis on po bicarb. Cr last year 05/2017 2.09, on admit 3.17. Up to 3.74 today Nephrology is following.  Magnesium 1.3 - would consider replacing.  K+ 5.1    3. Severe pulmonary hypertension - I suspect likely related to left sided heart disease and significant volume overload. Would repeat echo once euvolemic to reassess, if persistent could consider further workup.    4. PAF - isolated episode some years ago. Recurent episode this admission noted on tele, transient, some SVT - with anemia issues would not start anticoag at this time.  - currently SR.   5.  Anemia with hgb 7.0 ? Due to Chronic disease but may warrant transfusion.   6.  HTN  On lasix and zaroxolyn and lopressor 50 BID       For questions or updates, please contact [place] Please consult [email or web address]  for contact info under        Signed, Cecilie Kicks, NP  02/09/2019, 8:33 AM

## 2019-02-09 NOTE — Plan of Care (Signed)
°  Problem: Education: °Goal: Ability to demonstrate management of disease process will improve °Outcome: Progressing °Goal: Ability to verbalize understanding of medication therapies will improve °Outcome: Progressing °Goal: Individualized Educational Video(s) °Outcome: Progressing °  °

## 2019-02-09 NOTE — TOC Transition Note (Addendum)
Transition of Care [facility]) - CM/SW Discharge Note   Patient Details  Name: Stephanie Griffith MRN: [id number] Date of Birth: 31-Oct-1945  [facility]) CM/SW Contact:  Zenon Mayo, RN Phone Number: 02/09/2019, 4:51 PM   Clinical Narrative:    From home with daughter, CHF, has prosthesis is at home, cont to diurese. NCM offered choice, she states she has no preference.  NCM made referral to [facility] with [facility] for [facility] , he states he is able to take referral. Soc will begin 24 to 48 hrs post dc. Patient was observation and changed to inpatient.   Final next level of care: [place] Barriers to Discharge: No Barriers Identified   Patient Goals and CMS Choice Patient states their goals for this hospitalization and ongoing recovery are:: get better CMS Medicare.gov Compare Post Acute Care list provided to:: Patient Choice offered to / list presented to : Patient  Discharge Placement                       Discharge Plan and Services                DME Arranged: (NA)         HH Arranged: RN, Disease Management [place] Agency: [place] Date [place]: 02/09/19 Time [place]: [id number] Representative spoke with at [place]: [place] ([place]) Interventions     Readmission Risk Interventions No flowsheet data found.

## 2019-02-09 NOTE — Progress Notes (Signed)
PROGRESS NOTE    Stephanie Griffith  [id number] DOB: 1945/08/08 DOA: 02/05/2019 PCP: [name], MD   Brief Narrative:  73 y.o. female with medical history significant for chronic kidney disease stage IV, peripheral vascular disease status post left AKA, hypertension, chronic anemia, and chronic diastolic CHF, now presenting to the emergency department with 1 week of progressive leg swelling and shortness of breath.   Found to be in CHF exacerbation complicated by CKD.   Assessment & Plan:   Principal Problem:   Acute on chronic diastolic CHF (congestive heart failure) (HCC) Active Problems:   Normocytic anemia   Essential hypertension, benign   Hyperkalemia   Chronic renal insufficiency, stage IV (severe) (HCC)   Metabolic acidosis  Acute on chronic congestive heart failure with preserved ejection fraction, class III -Plans to wean off oxygen.  -Echocardiogram 11/6-60 to 65%, right ventricular volume and pressure overload. - Following adequate diuresis, may consider right heart cath for further evaluation. If evidence of PAH, would get advanced heart failure to assist with medications. -  appreciate Cardio input  - Changed to 120mg  3 times daily per Nephrology; appreciate Nephro care  - Cont daily ins and outs monitoring, fluid restriction to 1.5L, salt restriction to less than 2 g a day -Monitor electrolytes and replete as necessary -Wean off oxygen  Acute kidney injury on chronic kidney disease stage IV -Baseline 2.0.  Admission creatinine 3.1, trended up to 3.7.  Wonder if this is her new baseline but we will continue to monitor.  -Latest FeNA of 11.1% which predicts to be post renal cause -Renal ultrasound: 1. Echogenic right kidney compatible with chronic medical renal disease. Left kidney could not be identified, also probably due to chronic renal disease. 2. Diminutive and unremarkable urinary bladder. - Follow Renal recs; Appreciated    Essential hypertension  -Getting aggressive diuretics.  Will adjust cardiac regimen as appropriate  Anemia of chronic disease -Baseline hemoglobin 8.0.  Continue to closely monitor.  Elevated D-dimer -Lower extremity Dopplers done-negative.  Unable to perform CTA chest given renal dysfunction.  Low suspicion for PE.  DVT prophylaxis: Subcutaneous heparin Code Status: Full code Family Communication: None at bedside Disposition Plan: Maintain hospital stay for diuresis with caution while monitoring renal function.  Consultants:   Cardiology  Procedures:   None  Antimicrobials:   None   Subjective: Cont to feel better but has trouble while lying flat..  Still having some exertional shortness of breath.  Does not use any oxygen at home.   Review of Systems Otherwise negative except as per HPI, including: General: Denies fever, chills, night sweats or unintended weight loss. Resp: Denies cough, wheezing Cardiac: Denies chest pain, palpitations, orthopnea, paroxysmal nocturnal dyspnea. GI: Denies abdominal pain, nausea, vomiting, diarrhea or constipation GU: Denies dysuria, frequency, hesitancy or incontinence MS: Denies muscle aches, joint pain or swelling Neuro: Denies headache, neurologic deficits (focal weakness, numbness, tingling), abnormal gait Psych: Denies anxiety, depression, SI/HI/AVH Skin: Denies new rashes or lesions ID: Denies sick contacts, exotic exposures, travel  Objective: Vitals:   02/09/19 0500 02/09/19 0506 02/09/19 0913 02/09/19 1210  BP:  (!) 160/85 (!) 150/67 140/68  Pulse:  85  81  Resp:  18 20 20   Temp:  98.5 F (36.9 C)  98.6 F (37 C)  TempSrc:  Oral  Oral  SpO2:  100%  100%  Weight: 95.6 kg     Height:        Intake/Output Summary (Last 24 hours) at 02/09/2019 1401 Last  data filed at 02/09/2019 1238 Gross per 24 hour  Intake 884 ml  Output 2400 ml  Net -1516 ml   Filed Weights   02/07/19 0530 02/08/19 0500 02/09/19 0500  Weight: 97.7 kg 100.7 kg 95.6  kg    Examination:  General exam: Appears calm and comfortable, 2 L nasal cannula Respiratory system: Bilateral crackles at the bases Cardiovascular system: S1 & S2 heard, RRR. No JVD, murmurs, rubs, gallops or clicks. No pedal edema. Gastrointestinal system: Abdomen is nondistended, soft and nontender. No organomegaly or masses felt. Normal bowel sounds heard. Central nervous system: Alert and oriented. No focal neurological deficits. Extremities: Symmetric 5 x 5 power. Skin: No rashes, lesions or ulcers Psychiatry: Judgement and insight appear normal. Mood & affect appropriate.     Data Reviewed:   CBC: Recent Labs  Lab 02/05/19 1805 02/06/19 0312 02/06/19 1104 02/07/19 0342 02/08/19 0401 02/09/19 0435  WBC 12.4*  --  13.1* 10.0 11.1* 8.1  NEUTROABS 9.0*  --  10.7*  --   --   --   HGB 8.4* 7.5* 8.1* 7.5* 7.4* 7.0*  HCT 27.9* 22.0* 26.9* 25.1* 24.1* 23.2*  MCV 99.3  --  97.8 97.3 96.8 97.5  PLT 342  --  307 293 273 [id number]   Basic Metabolic Panel: Recent Labs  Lab 02/05/19 1805 02/06/19 0312 02/06/19 1104 02/07/19 0342 02/08/19 0401 02/09/19 0435  NA 142 145  --  145 144 144  K 5.4* 5.0  --  5.0 4.8 5.1  CL 121*  --   --  123* 121* 119*  CO2 13*  --   --  13* 16* 16*  GLUCOSE 103*  --   --  73 74 76  BUN 57*  --   --  64* 65* 73*  CREATININE 3.17*  --   --  3.36* 3.42* 3.74*  CALCIUM 7.0*  --   --  6.9* 6.9* 7.3*  MG  --   --  1.0* 1.0* 1.4* 1.3*   GFR: Estimated Creatinine Clearance: 16.2 mL/min (A) (by C-G formula based on SCr of 3.74 mg/dL (H)). Liver Function Tests: Recent Labs  Lab 02/05/19 1805  AST 26  ALT 20  ALKPHOS 166*  BILITOT 0.6  PROT 7.0  ALBUMIN 2.4*   No results for input(s): LIPASE, AMYLASE in the last 168 hours. No results for input(s): AMMONIA in the last 168 hours. Coagulation Profile: No results for input(s): INR, PROTIME in the last 168 hours. Cardiac Enzymes: No results for input(s): CKTOTAL, CKMB, CKMBINDEX, TROPONINI in the  last 168 hours. BNP (last 3 results) No results for input(s): PROBNP in the last 8760 hours. HbA1C: No results for input(s): HGBA1C in the last 72 hours. CBG: No results for input(s): GLUCAP in the last 168 hours. Lipid Profile: No results for input(s): CHOL, HDL, LDLCALC, TRIG, CHOLHDL, LDLDIRECT in the last 72 hours. Thyroid Function Tests: Recent Labs    02/07/19 1552  TSH 1.910   Anemia Panel: Recent Labs    02/07/19 1552 02/08/19 0401  VITAMINB12 164* 179*  FOLATE 14.1  --   FERRITIN 75 73  TIBC 151* 165*  IRON 29 27*  RETICCTPCT 4.3*  --    Sepsis Labs: No results for input(s): PROCALCITON, LATICACIDVEN in the last 168 hours.  Recent Results (from the past 240 hour(s))  SARS CORONAVIRUS 2 (TAT 6-24 HRS) Nasopharyngeal Nasopharyngeal Swab     Status: None   Collection Time: 02/06/19  4:36 AM   Specimen: Nasopharyngeal Swab  Result Value Ref Range Status   SARS Coronavirus 2 NEGATIVE NEGATIVE Final    Comment: (NOTE) SARS-CoV-2 target nucleic acids are NOT DETECTED. The SARS-CoV-2 RNA is generally detectable in upper and lower respiratory specimens during the acute phase of infection. Negative results do not preclude SARS-CoV-2 infection, do not rule out co-infections with other pathogens, and should not be used as the sole basis for treatment or other patient management decisions. Negative results must be combined with clinical observations, patient history, and epidemiological information. The expected result is Negative. Fact Sheet for Patients: SugarRoll.be Fact Sheet for Healthcare Providers: https://www.woods-mathews.com/ This test is not yet approved or cleared by the [place] FDA and  has been authorized for detection and/or diagnosis of SARS-CoV-2 by FDA under an Emergency Use Authorization (EUA). This EUA will remain  in effect (meaning this test can be used) for the duration of the COVID-19 declaration  under Section 56 4(b)(1) of the Act, 21 U.S.C. section 360bbb-3(b)(1), unless the authorization is terminated or revoked sooner. Performed at [facility], [place] [address] [place],  [address]          Radiology Studies: US Renal  Result Date: 02/08/2019 CLINICAL DATA:  73 year old female with stage 4 chronic kidney disease. EXAM: RENAL / URINARY TRACT ULTRASOUND COMPLETE COMPARISON:  CT Abdomen and Pelvis 12/30/2015. FINDINGS: Right Kidney: Renal measurements: 8.3 x 4.5 x 4.2 centimeters = volume: 82 mL. Echogenic right kidney difficult to delineate (image 6). No right hydronephrosis or renal mass. Left Kidney: Renal measurements: Could not be identified, probably in part due to echogenic left renal cortex difficult to differentiate from the retroperitoneal fat. Bladder: Diminutive.  Appears normal for degree of bladder distention. IMPRESSION: 1. Echogenic right kidney compatible with chronic medical renal disease. Left kidney could not be identified, also probably due to chronic renal disease. 2. Diminutive and unremarkable urinary bladder. Electronically Signed   By: Genevie Ann M.D.   On: 02/08/2019 21:59        Scheduled Meds: . aspirin  81 mg Oral Daily  . gabapentin  100 mg Oral BID  . heparin  5,000 Units Subcutaneous Q8H  . influenza vaccine adjuvanted  0.5 mL Intramuscular Tomorrow-1000  . mouth rinse  15 mL Mouth Rinse BID  . metolazone  10 mg Oral Daily  . metoprolol tartrate  50 mg Oral BID  . sodium bicarbonate  650 mg Oral TID  . sodium chloride flush  3 mL Intravenous Q12H   Continuous Infusions: . sodium chloride    . furosemide 120 mg (02/09/19 0519)     LOS: 3 days   Time spent= 35 mins    Thornell Mule, MD [facility]  If 7PM-7AM, please contact night-coverage  02/09/2019, 2:01 PM

## 2019-02-09 NOTE — Progress Notes (Signed)
Patient started coughing and had a small nose bleed that stopped quickly by applied pressure, will keep monitoring.

## 2019-02-10 ENCOUNTER — Encounter (HOSPITAL_COMMUNITY): Payer: Self-pay | Admitting: Cardiology

## 2019-02-10 LAB — CBC
HCT: 23.3 % — ABNORMAL LOW (ref 36.0–46.0)
Hemoglobin: 7 g/dL — ABNORMAL LOW (ref 12.0–15.0)
MCH: 29.2 pg (ref 26.0–34.0)
MCHC: 30 g/dL (ref 30.0–36.0)
MCV: 97.1 fL (ref 80.0–100.0)
Platelets: 290 10*3/uL (ref 150–400)
RBC: 2.4 MIL/uL — ABNORMAL LOW (ref 3.87–5.11)
RDW: 16.6 % — ABNORMAL HIGH (ref 11.5–15.5)
WBC: 8 10*3/uL (ref 4.0–10.5)
nRBC: 0.2 % (ref 0.0–0.2)

## 2019-02-10 LAB — BASIC METABOLIC PANEL
Anion gap: 9 (ref 5–15)
BUN: 74 mg/dL — ABNORMAL HIGH (ref 8–23)
CO2: 17 mmol/L — ABNORMAL LOW (ref 22–32)
Calcium: 7.4 mg/dL — ABNORMAL LOW (ref 8.9–10.3)
Chloride: 116 mmol/L — ABNORMAL HIGH (ref 98–111)
Creatinine, Ser: 3.88 mg/dL — ABNORMAL HIGH (ref 0.44–1.00)
GFR calc Af Amer: 13 mL/min — ABNORMAL LOW (ref >60)
GFR calc non Af Amer: 11 mL/min — ABNORMAL LOW (ref >60)
Glucose, Bld: 78 mg/dL (ref 70–99)
Potassium: 4.9 mmol/L (ref 3.5–5.1)
Sodium: 142 mmol/L (ref 135–145)

## 2019-02-10 LAB — PREPARE RBC (CROSSMATCH)

## 2019-02-10 LAB — MAGNESIUM: Magnesium: 1.3 mg/dL — ABNORMAL LOW (ref 1.7–2.4)

## 2019-02-10 MED ORDER — SODIUM CHLORIDE 0.9% IV SOLUTION
Freq: Once | INTRAVENOUS | Status: AC
  Administered 2019-02-10: 17:00:00 via INTRAVENOUS

## 2019-02-10 MED ORDER — LUBRIDERM SERIOUSLY SENSITIVE EX LOTN
TOPICAL_LOTION | Freq: Three times a day (TID) | CUTANEOUS | Status: DC
  Administered 2019-02-10 (×2): 1 via TOPICAL
  Administered 2019-02-11: 09:00:00 via TOPICAL
  Administered 2019-02-11: 1 via TOPICAL
  Administered 2019-02-11 – 2019-02-12 (×4): via TOPICAL
  Administered 2019-02-13: 1 via TOPICAL
  Administered 2019-02-13 – 2019-02-16 (×10): via TOPICAL
  Administered 2019-02-16: 1 via TOPICAL
  Administered 2019-02-17 – 2019-02-18 (×4): via TOPICAL
  Filled 2019-02-10 (×2): qty 473

## 2019-02-10 MED ORDER — MAGNESIUM SULFATE 4 GM/100ML IV SOLN
4.0000 g | Freq: Once | INTRAVENOUS | Status: AC
  Administered 2019-02-10: 4 g via INTRAVENOUS
  Filled 2019-02-10: qty 100

## 2019-02-10 NOTE — Progress Notes (Deleted)
PROGRESS NOTE    Stephanie Griffith  [id number] DOB: Feb 21, 1946 DOA: 02/05/2019 PCP: [name], MD   Brief Narrative:  73 y.o. female with medical history significant for chronic kidney disease stage IV, peripheral vascular disease status post left AKA, hypertension, chronic anemia, and chronic diastolic CHF, now presenting to the emergency department with 1 week of progressive leg swelling and shortness of breath.   Found to be in CHF exacerbation complicated by CKD.   Assessment & Plan:   Principal Problem:   Acute on chronic diastolic CHF (congestive heart failure) (HCC) Active Problems:   Normocytic anemia   Essential hypertension, benign   Hyperkalemia   Chronic renal insufficiency, stage IV (severe) (HCC)   Metabolic acidosis  Acute on chronic congestive heart failure with preserved ejection fraction, class III -Plans to wean off oxygen.  -Echocardiogram 11/6-60 to 65%, right ventricular volume and pressure overload. - Following adequate diuresis, may consider right heart cath for further evaluation. If evidence of PAH, would get advanced heart failure to assist with medications. -  appreciate Cardio input  - Changed to 120mg  3 times daily per Nephrology; appreciate Nephro care  - Cont daily ins and outs monitoring, fluid restriction to 1.5L, salt restriction to less than 2 g a day -Monitor electrolytes and replete as necessary -Wean off oxygen  Acute kidney injury on chronic kidney disease stage IV -Baseline 2.0.  Admission creatinine 3.1, trended up to 3.7.  Wonder if this is her new baseline but we will continue to monitor.  -Latest FeNA of 11.1% which predicts to be post renal cause -Renal ultrasound: 1. Echogenic right kidney compatible with chronic medical renal disease. Left kidney could not be identified, also probably due to chronic renal disease. 2. Diminutive and unremarkable urinary bladder. - Follow Renal recs; Appreciated    Essential hypertension  -Getting aggressive diuretics.  Will adjust cardiac regimen as appropriate  Anemia of chronic disease -Baseline hemoglobin 8.0.  Continue to closely monitor.  Elevated D-dimer -Lower extremity Dopplers done-negative.  Unable to perform CTA chest given renal dysfunction.  Low suspicion for PE.  DVT prophylaxis: Subcutaneous heparin Code Status: Full code Family Communication: None at bedside Disposition Plan: Maintain hospital stay for diuresis with caution while monitoring renal function.  Consultants:   Cardiology  Procedures:   None  Antimicrobials:   None   Subjective: Cont to feel better but has trouble while lying flat..  Still having some exertional shortness of breath.  Does not use any oxygen at home.   Review of Systems Otherwise negative except as per HPI, including: General: Denies fever, chills, night sweats or unintended weight loss. Resp: Denies cough, wheezing Cardiac: Denies chest pain, palpitations, orthopnea, paroxysmal nocturnal dyspnea. GI: Denies abdominal pain, nausea, vomiting, diarrhea or constipation GU: Denies dysuria, frequency, hesitancy or incontinence MS: Denies muscle aches, joint pain or swelling Neuro: Denies headache, neurologic deficits (focal weakness, numbness, tingling), abnormal gait Psych: Denies anxiety, depression, SI/HI/AVH Skin: Denies new rashes or lesions ID: Denies sick contacts, exotic exposures, travel  Objective: Vitals:   02/09/19 1521 02/09/19 1944 02/10/19 0416 02/10/19 0419  BP: (!) 143/73 (!) 146/64 (!) 147/68   Pulse:  85 80   Resp:  18 20   Temp:  98.3 F (36.8 C) 98.4 F (36.9 C)   TempSrc:  Oral Oral   SpO2:  100% 100%   Weight:    95 kg  Height:        Intake/Output Summary (Last 24 hours) at 02/10/2019 0741  Last data filed at 02/10/2019 0600 Gross per 24 hour  Intake 1247.24 ml  Output 2600 ml  Net -1352.76 ml   Filed Weights   02/08/19 0500 02/09/19 0500 02/10/19 0419  Weight: 100.7 kg  95.6 kg 95 kg    Examination:  General exam: Appears calm and comfortable, 2 L nasal cannula Respiratory system: Bilateral crackles at the bases Cardiovascular system: S1 & S2 heard, RRR. No JVD, murmurs, rubs, gallops or clicks. No pedal edema. Gastrointestinal system: Abdomen is nondistended, soft and nontender. No organomegaly or masses felt. Normal bowel sounds heard. Central nervous system: Alert and oriented. No focal neurological deficits. Extremities: Symmetric 5 x 5 power. Skin: No rashes, lesions or ulcers Psychiatry: Judgement and insight appear normal. Mood & affect appropriate.     Data Reviewed:   CBC: Recent Labs  Lab 02/05/19 1805  02/06/19 1104 02/07/19 0342 02/08/19 0401 02/09/19 0435 02/10/19 0351  WBC 12.4*  --  13.1* 10.0 11.1* 8.1 8.0  NEUTROABS 9.0*  --  10.7*  --   --   --   --   HGB 8.4*   < > 8.1* 7.5* 7.4* 7.0* 7.0*  HCT 27.9*   < > 26.9* 25.1* 24.1*  23.1* 23.2* 23.3*  MCV 99.3  --  97.8 97.3 96.8 97.5 97.1  PLT 342  --  307 293 273 276 290   < > = values in this interval not displayed.   Basic Metabolic Panel: Recent Labs  Lab 02/05/19 1805 02/06/19 0312 02/06/19 1104 02/07/19 0342 02/08/19 0401 02/09/19 0435 02/10/19 0351  NA 142 145  --  145 144 144 142  K 5.4* 5.0  --  5.0 4.8 5.1 4.9  CL 121*  --   --  123* 121* 119* 116*  CO2 13*  --   --  13* 16* 16* 17*  GLUCOSE 103*  --   --  73 74 76 78  BUN 57*  --   --  64* 65* 73* 74*  CREATININE 3.17*  --   --  3.36* 3.42* 3.74* 3.88*  CALCIUM 7.0*  --   --  6.9* 6.9* 7.3* 7.4*  MG  --   --  1.0* 1.0* 1.4* 1.3* 1.3*   GFR: Estimated Creatinine Clearance: 15.6 mL/min (A) (by C-G formula based on SCr of 3.88 mg/dL (H)). Liver Function Tests: Recent Labs  Lab 02/05/19 1805  AST 26  ALT 20  ALKPHOS 166*  BILITOT 0.6  PROT 7.0  ALBUMIN 2.4*   No results for input(s): LIPASE, AMYLASE in the last 168 hours. No results for input(s): AMMONIA in the last 168 hours. Coagulation  Profile: No results for input(s): INR, PROTIME in the last 168 hours. Cardiac Enzymes: No results for input(s): CKTOTAL, CKMB, CKMBINDEX, TROPONINI in the last 168 hours. BNP (last 3 results) No results for input(s): PROBNP in the last 8760 hours. HbA1C: No results for input(s): HGBA1C in the last 72 hours. CBG: No results for input(s): GLUCAP in the last 168 hours. Lipid Profile: No results for input(s): CHOL, HDL, LDLCALC, TRIG, CHOLHDL, LDLDIRECT in the last 72 hours. Thyroid Function Tests: Recent Labs    02/07/19 1552  TSH 1.910   Anemia Panel: Recent Labs    02/07/19 1552 02/08/19 0401  VITAMINB12 164* 179*  FOLATE 14.1  --   FERRITIN 75 73  TIBC 151* 165*  IRON 29 27*  RETICCTPCT 4.3*  --    Sepsis Labs: No results for input(s): PROCALCITON, LATICACIDVEN in the last  168 hours.  Recent Results (from the past 240 hour(s))  SARS CORONAVIRUS 2 (TAT 6-24 HRS) Nasopharyngeal Nasopharyngeal Swab     Status: None   Collection Time: 02/06/19  4:36 AM   Specimen: Nasopharyngeal Swab  Result Value Ref Range Status   SARS Coronavirus 2 NEGATIVE NEGATIVE Final    Comment: (NOTE) SARS-CoV-2 target nucleic acids are NOT DETECTED. The SARS-CoV-2 RNA is generally detectable in upper and lower respiratory specimens during the acute phase of infection. Negative results do not preclude SARS-CoV-2 infection, do not rule out co-infections with other pathogens, and should not be used as the sole basis for treatment or other patient management decisions. Negative results must be combined with clinical observations, patient history, and epidemiological information. The expected result is Negative. Fact Sheet for Patients: SugarRoll.be Fact Sheet for Healthcare Providers: https://www.woods-mathews.com/ This test is not yet approved or cleared by the [place] FDA and  has been authorized for detection and/or diagnosis of SARS-CoV-2 by  FDA under an Emergency Use Authorization (EUA). This EUA will remain  in effect (meaning this test can be used) for the duration of the COVID-19 declaration under Section 56 4(b)(1) of the Act, 21 U.S.C. section 360bbb-3(b)(1), unless the authorization is terminated or revoked sooner. Performed at [facility], [place] [address] [place], [place] [address]          Radiology Studies: US Renal  Result Date: 02/08/2019 CLINICAL DATA:  73 year old female with stage 4 chronic kidney disease. EXAM: RENAL / URINARY TRACT ULTRASOUND COMPLETE COMPARISON:  CT Abdomen and Pelvis 12/30/2015. FINDINGS: Right Kidney: Renal measurements: 8.3 x 4.5 x 4.2 centimeters = volume: 82 mL. Echogenic right kidney difficult to delineate (image 6). No right hydronephrosis or renal mass. Left Kidney: Renal measurements: Could not be identified, probably in part due to echogenic left renal cortex difficult to differentiate from the retroperitoneal fat. Bladder: Diminutive.  Appears normal for degree of bladder distention. IMPRESSION: 1. Echogenic right kidney compatible with chronic medical renal disease. Left kidney could not be identified, also probably due to chronic renal disease. 2. Diminutive and unremarkable urinary bladder. Electronically Signed   By: Genevie Ann M.D.   On: 02/08/2019 21:59        Scheduled Meds: . aspirin  81 mg Oral Daily  . gabapentin  100 mg Oral BID  . heparin  5,000 Units Subcutaneous Q8H  . influenza vaccine adjuvanted  0.5 mL Intramuscular Tomorrow-1000  . mouth rinse  15 mL Mouth Rinse BID  . metolazone  10 mg Oral Daily  . metoprolol tartrate  50 mg Oral BID  . sodium bicarbonate  650 mg Oral TID  . sodium chloride flush  3 mL Intravenous Q12H   Continuous Infusions: . sodium chloride 250 mL (02/09/19 1519)  . furosemide 120 mg (02/10/19 0505)     LOS: 4 days   Time spent= 35 mins    Thornell Mule, MD [facility]  If 7PM-7AM, please contact  night-coverage  02/10/2019, 7:41 AM

## 2019-02-10 NOTE — Progress Notes (Signed)
[facility] Progress Note  Subjective:   No c/o  2.6L UOP, Net neg 1.4L yesterday  SCr slight worse to 3.88, K 4.9, Mg 1.3  Cont on lasix/metolazone  Bed weights improving  Still massive LEE  Vitals:   02/10/19 0416 02/10/19 0419 02/10/19 0828 02/10/19 1136  BP: (!) 147/68  (!) 154/71 (!) 132/54  Pulse: 80  79 80  Resp: 20   18  Temp: 98.4 F (36.9 C)   98.3 F (36.8 C)  TempSrc: Oral   Oral  SpO2: 100%   100%  Weight:  95 kg    Height:        Inpatient medications: . sodium chloride   Intravenous Once  . aspirin  81 mg Oral Daily  . gabapentin  100 mg Oral BID  . heparin  5,000 Units Subcutaneous Q8H  . influenza vaccine adjuvanted  0.5 mL Intramuscular Tomorrow-1000  . lubriderm seriously sensitive   Topical TID  . mouth rinse  15 mL Mouth Rinse BID  . metolazone  10 mg Oral Daily  . metoprolol tartrate  50 mg Oral BID  . sodium bicarbonate  650 mg Oral TID  . sodium chloride flush  3 mL Intravenous Q12H   . sodium chloride 250 mL (02/09/19 1519)  . furosemide 120 mg (02/10/19 0505)   sodium chloride, acetaminophen, hydrALAZINE, ondansetron (ZOFRAN) IV, polyethylene glycol, senna-docusate, sodium chloride flush, zolpidem    Exam: Gen  NAD, pleasant, on nasal cannula +JVD Chest mostly clear, occ rales , no wheezing, no ^wob RRR no MRG Soft, nontender GU purewick draining pale yellow urine Left AKA, right lower extremity with scaling and hyperpigmentation consistent with chronic venous stasis Neuro is alert, Ox 3 , nf    Home meds:  - amlodipine 10/ metoprolol 50 bid  - gabapentin 100 bid  - aspirin 81      Date             Creat               eGFR  2009- 10         1.93- 3.11  2013               1.46- 2.97          2017               1.66- 2.79        16- 30  Feb 2019        2.06                 23  02/05/2019       3.17                 16, stage IV  02/07/2019       3.36                 15, stage IV-V    Na 145 K 5.0  CO2  13  BUN 64  Cr 3.36  Mg 1.0 Ca 6.9  Alb 2.4   WBC 10k  Hb 7.5  plt 293   CXR 11/5 > Cardiomegaly and pulmonary vascular congestion without overt edema  ECHO - LVEF 60%, RV pressure/ vol overload, RV slightly reduced fxn. RA severely dilated, severe ^RV sys 110m Hg. IVC dilated w/ low resp variability suggesting RA pressure 165mHg.   Assessment/ Plan: 1. Renal failure - since 2013 - Feb 2019 patient has had mostly CKD  IV disease w/ creat 1.6- 2.5.  Now creat 3's.  Favor progressive CKD, could have a component of AKI.Marland Kitchen No uremic symptoms.    Slowly diuresing, continue lasix 120 tid and add po zaroxlyn 10 qd.  2. Pulm edema - w/ SOB, vol overload, LE edema. Diuresing as above, weights improving 3. HTN - cont meds BP's reasonably well controlled 4. Met acidosis - po bicarb started, folow 5. SP L AKA 6. Obesity 7. Pulm HTN 8. COPD 9. Chron diast HF, and likely right-sided heart failure, per cardiology 10. Anemia, transfuse as indicated 11. Hypomagnesemia, Diuretic/diet related, replete aggressively  Rexene Agent  02/10/2019, 1:19 PM  Iron/TIBC/Ferritin/ %Sat    Component Value Date/Time   IRON 27 (L) 02/08/2019 0401   TIBC 165 (L) 02/08/2019 0401   FERRITIN 73 02/08/2019 0401   IRONPCTSAT 16 02/08/2019 0401   Recent Labs  Lab 02/05/19 1805  02/10/19 0351  NA 142   < > 142  K 5.4*   < > 4.9  CL 121*   < > 116*  CO2 13*   < > 17*  GLUCOSE 103*   < > 78  BUN 57*   < > 74*  CREATININE 3.17*   < > 3.88*  CALCIUM 7.0*   < > 7.4*  ALBUMIN 2.4*  --   --    < > = values in this interval not displayed.   Recent Labs  Lab 02/05/19 1805  AST 26  ALT 20  ALKPHOS 166*  BILITOT 0.6  PROT 7.0   Recent Labs  Lab 02/10/19 0351  WBC 8.0  HGB 7.0*  HCT 23.3*  PLT 290

## 2019-02-10 NOTE — Progress Notes (Addendum)
Stephanie NOTE    Stephanie Griffith  [name] DOB: [birth year] DOA: 02/05/2019 PCP: [name], MD   Brief Narrative:  73 y.o. female with medical history significant for chronic kidney disease stage IV, peripheral vascular disease status post left AKA, hypertension, chronic anemia, and chronic diastolic CHF, now presenting to the emergency department with 1 week of progressive leg swelling and shortness of breath.   Found to be in CHF exacerbation complicated by CKD.   Assessment & Plan:   Principal Problem:   Acute on chronic diastolic CHF (congestive heart failure) (HCC) Active Problems:   Normocytic anemia   Essential hypertension, benign   Hyperkalemia   Chronic renal insufficiency, stage IV (severe) (HCC)   Metabolic acidosis  Acute on chronic congestive heart failure with preserved ejection fraction, class III -Plans to wean off oxygen.  -Echocardiogram 11/6-60 to 65%, right ventricular volume and pressure overload. - Following adequate diuresis, may consider right heart cath for further evaluation. If evidence of PAH, would get advanced heart failure to assist with medications. May need Rt heart Cath per Cardio  -  appreciate Cardio input  - Cont lasix 120mg  3 times daily and Zaroxolyn 10mg  daily per Nephrology; appreciate Nephro care  - ~1.6L negatively balanced over the past 24 hours; weight also degreasing slowly  - Cont daily ins and outs monitoring, fluid restriction to 1.5L, salt restriction to less than 2 g a day -Monitor electrolytes and replete as necessary -Wean off oxygen  Acute kidney injury on chronic kidney disease stage IV -Baseline 2.0.  Admission creatinine 3.1, trended up to 3.8.  May be close to baseline  -Latest FENa of 11.1% which predicts to be post renal cause -Renal ultrasound: 1. Echogenic right kidney compatible with chronic medical renal disease. Left kidney could not be identified, also probably due to chronic renal disease. 2. Diminutive  and unremarkable urinary bladder. - Follow Renal recs; Appreciated    Essential hypertension -Getting aggressive diuretics.  Will adjust cardiac regimen as appropriate  Anemia of chronic disease -Baseline hemoglobin 8.0.  Currently 7/23.3 - Pt has no S/S at this time  - Continue to closely monitor; may need to transfuse if further drops or symptomatic - Also to avoid volume overload   Elevated D-dimer -Lower extremity Dopplers done-negative.  Unable to perform CTA chest given renal dysfunction.  Low suspicion for PE.  Lower Ext Skin dryness:  -Likely from extreme diuresis on top of her baseline condition - Started lanolin lotion 3 times daily -Continue moisturization  DVT prophylaxis: Subcutaneous heparin Code Status: Full code Family Communication: None at bedside Disposition Plan: Maintain hospital stay for diuresis with caution while monitoring renal function.  Consultants:   Cardiology  Procedures:   None  Antimicrobials:   None   Subjective: Still having some exertional shortness of breath.  Does not use any oxygen at home. B/L lower Ext skin dryness.   Review of Systems Otherwise negative except as per HPI, including: General: Denies fever, chills, night sweats or unintended weight loss. Resp: Denies cough, wheezing Cardiac: Denies chest pain, palpitations, orthopnea, paroxysmal nocturnal dyspnea. GI: Denies abdominal pain, nausea, vomiting, diarrhea or constipation GU: Denies dysuria, frequency, hesitancy or incontinence MS: Denies muscle aches, joint pain or swelling Neuro: Denies headache, neurologic deficits (focal weakness, numbness, tingling), abnormal gait Psych: Denies anxiety, depression, SI/HI/AVH Skin: Denies new rashes or lesions ID: Denies sick contacts, exotic exposures, travel  Objective: Vitals:   02/10/19 0416 02/10/19 0419 02/10/19 0828 02/10/19 1136  BP: (!) 147/68  Marland Kitchen)  154/71 (!) 132/54  Pulse: 80  79 80  Resp: 20   18  Temp: 98.4  F (36.9 C)   98.3 F (36.8 C)  TempSrc: Oral   Oral  SpO2: 100%   100%  Weight:  95 kg    Height:        Intake/Output Summary (Last 24 hours) at 02/10/2019 1306 Last data filed at 02/10/2019 1136 Gross per 24 hour  Intake 785.24 ml  Output 2225 ml  Net -1439.76 ml   Filed Weights   02/08/19 0500 02/09/19 0500 02/10/19 0419  Weight: 100.7 kg 95.6 kg 95 kg    Examination:  General exam: Appears calm and comfortable, 2 L nasal cannula Respiratory system: Bilateral crackles at the bases Cardiovascular system: S1 & S2 heard, RRR. No JVD, murmurs, rubs, gallops or clicks. No pedal edema. Gastrointestinal system: Abdomen is nondistended, soft and nontender. No organomegaly or masses felt. Normal bowel sounds heard. Central nervous system: Alert and oriented. No focal neurological deficits. Extremities: Symmetric 5 x 5 power. Skin: No rashes, lesions or ulcers Psychiatry: Judgement and insight appear normal. Mood & affect appropriate.     Data Reviewed:   CBC: Recent Labs  Lab 02/05/19 1805  02/06/19 1104 02/07/19 0342 02/08/19 0401 02/09/19 0435 02/10/19 0351  WBC 12.4*  --  13.1* 10.0 11.1* 8.1 8.0  NEUTROABS 9.0*  --  10.7*  --   --   --   --   HGB 8.4*   < > 8.1* 7.5* 7.4* 7.0* 7.0*  HCT 27.9*   < > 26.9* 25.1* 24.1*  23.1* 23.2* 23.3*  MCV 99.3  --  97.8 97.3 96.8 97.5 97.1  PLT 342  --  307 293 273 276 290   < > = values in this interval not displayed.   Basic Metabolic Panel: Recent Labs  Lab 02/05/19 1805 02/06/19 0312 02/06/19 1104 02/07/19 0342 02/08/19 0401 02/09/19 0435 02/10/19 0351  NA 142 145  --  145 144 144 142  K 5.4* 5.0  --  5.0 4.8 5.1 4.9  CL 121*  --   --  123* 121* 119* 116*  CO2 13*  --   --  13* 16* 16* 17*  GLUCOSE 103*  --   --  73 74 76 78  BUN 57*  --   --  64* 65* 73* 74*  CREATININE 3.17*  --   --  3.36* 3.42* 3.74* 3.88*  CALCIUM 7.0*  --   --  6.9* 6.9* 7.3* 7.4*  MG  --   --  1.0* 1.0* 1.4* 1.3* 1.3*   GFR:  Estimated Creatinine Clearance: 15.6 mL/min (A) (by C-G formula based on SCr of 3.88 mg/dL (H)). Liver Function Tests: Recent Labs  Lab 02/05/19 1805  AST 26  ALT 20  ALKPHOS 166*  BILITOT 0.6  PROT 7.0  ALBUMIN 2.4*   No results for input(s): LIPASE, AMYLASE in the last 168 hours. No results for input(s): AMMONIA in the last 168 hours. Coagulation Profile: No results for input(s): INR, PROTIME in the last 168 hours. Cardiac Enzymes: No results for input(s): CKTOTAL, CKMB, CKMBINDEX, TROPONINI in the last 168 hours. BNP (last 3 results) No results for input(s): PROBNP in the last 8760 hours. HbA1C: No results for input(s): HGBA1C in the last 72 hours. CBG: No results for input(s): GLUCAP in the last 168 hours. Lipid Profile: No results for input(s): CHOL, HDL, LDLCALC, TRIG, CHOLHDL, LDLDIRECT in the last 72 hours. Thyroid Function Tests: Recent  Labs    02/07/19 1552  TSH 1.910   Anemia Panel: Recent Labs    02/07/19 1552 02/08/19 0401  VITAMINB12 164* 179*  FOLATE 14.1  --   FERRITIN 75 73  TIBC 151* 165*  IRON 29 27*  RETICCTPCT 4.3*  --    Sepsis Labs: No results for input(s): PROCALCITON, LATICACIDVEN in the last 168 hours.  Recent Results (from the past 240 hour(s))  SARS CORONAVIRUS 2 (TAT 6-24 HRS) Nasopharyngeal Nasopharyngeal Swab     Status: None   Collection Time: 02/06/19  4:36 AM   Specimen: Nasopharyngeal Swab  Result Value Ref Range Status   SARS Coronavirus 2 NEGATIVE NEGATIVE Final    Comment: (NOTE) SARS-CoV-2 target nucleic acids are NOT DETECTED. The SARS-CoV-2 RNA is generally detectable in upper and lower respiratory specimens during the acute phase of infection. Negative results do not preclude SARS-CoV-2 infection, do not rule out co-infections with other pathogens, and should not be used as the sole basis for treatment or other patient management decisions. Negative results must be combined with clinical observations, patient  history, and epidemiological information. The expected result is Negative. Fact Sheet for Patients: SugarRoll.be Fact Sheet for Healthcare Providers: https://www.woods-mathews.com/ This test is not yet approved or cleared by the [place] FDA and  has been authorized for detection and/or diagnosis of SARS-CoV-2 by FDA under an Emergency Use Authorization (EUA). This EUA will remain  in effect (meaning this test can be used) for the duration of the COVID-19 declaration under Section 56 4(b)(1) of the Act, 21 U.S.C. section 360bbb-3(b)(1), unless the authorization is terminated or revoked sooner. Performed at [facility], [place] [address], [place], [place] [address]          Radiology Studies: US Renal  Result Date: 02/08/2019 CLINICAL DATA:  73 year old female with stage 4 chronic kidney disease. EXAM: RENAL / URINARY TRACT ULTRASOUND COMPLETE COMPARISON:  CT Abdomen and Pelvis 12/30/2015. FINDINGS: Right Kidney: Renal measurements: 8.3 x 4.5 x 4.2 centimeters = volume: 82 mL. Echogenic right kidney difficult to delineate (image 6). No right hydronephrosis or renal mass. Left Kidney: Renal measurements: Could not be identified, probably in part due to echogenic left renal cortex difficult to differentiate from the retroperitoneal fat. Bladder: Diminutive.  Appears normal for degree of bladder distention. IMPRESSION: 1. Echogenic right kidney compatible with chronic medical renal disease. Left kidney could not be identified, also probably due to chronic renal disease. 2. Diminutive and unremarkable urinary bladder. Electronically Signed   By: Genevie Ann M.D.   On: 02/08/2019 21:59        Scheduled Meds: . aspirin  81 mg Oral Daily  . gabapentin  100 mg Oral BID  . heparin  5,000 Units Subcutaneous Q8H  . influenza vaccine adjuvanted  0.5 mL Intramuscular Tomorrow-1000  . lanolin/mineral oil   Topical TID  . mouth rinse  15 mL Mouth  Rinse BID  . metolazone  10 mg Oral Daily  . metoprolol tartrate  50 mg Oral BID  . sodium bicarbonate  650 mg Oral TID  . sodium chloride flush  3 mL Intravenous Q12H   Continuous Infusions: . sodium chloride 250 mL (02/09/19 1519)  . furosemide 120 mg (02/10/19 0505)     LOS: 4 days   Time spent= 35 mins    Thornell Mule, MD [facility]  If 7PM-7AM, please contact night-coverage  02/10/2019, 1:06 PM

## 2019-02-10 NOTE — Care Management Important Message (Signed)
Important Message  Patient Details  Name: Stephanie Griffith MRN: [id number] Date of Birth: December 24, 1945   Medicare Important Message Given:  Yes     Shelda Altes 02/10/2019, 1:15 PM

## 2019-02-10 NOTE — Progress Notes (Signed)
Progress Note  Patient Name: Stephanie Griffith Date of Encounter: 02/10/2019  Primary Cardiologist: Minus Breeding, MD   Subjective   No complaints, no chest pain no SOB   Inpatient Medications    Scheduled Meds: . aspirin  81 mg Oral Daily  . gabapentin  100 mg Oral BID  . heparin  5,000 Units Subcutaneous Q8H  . influenza vaccine adjuvanted  0.5 mL Intramuscular Tomorrow-1000  . mouth rinse  15 mL Mouth Rinse BID  . metolazone  10 mg Oral Daily  . metoprolol tartrate  50 mg Oral BID  . sodium bicarbonate  650 mg Oral TID  . sodium chloride flush  3 mL Intravenous Q12H   Continuous Infusions: . sodium chloride 250 mL (02/09/19 1519)  . furosemide 120 mg (02/10/19 0505)   PRN Meds: sodium chloride, acetaminophen, hydrALAZINE, ondansetron (ZOFRAN) IV, polyethylene glycol, senna-docusate, sodium chloride flush, zolpidem   Vital Signs    Vitals:   02/10/19 0416 02/10/19 0419 02/10/19 0828 02/10/19 1136  BP: (!) 147/68  (!) 154/71 (!) 132/54  Pulse: 80  79 80  Resp: 20   18  Temp: 98.4 F (36.9 C)   98.3 F (36.8 C)  TempSrc: Oral   Oral  SpO2: 100%   100%  Weight:  95 kg    Height:        Intake/Output Summary (Last 24 hours) at 02/10/2019 1156 Last data filed at 02/10/2019 1136 Gross per 24 hour  Intake 1007.24 ml  Output 2525 ml  Net -1517.76 ml   Last 3 Weights 02/10/2019 02/09/2019 02/08/2019  Weight (lbs) 209 lb 7 oz 210 lb 12.2 oz 222 lb  Weight (kg) 95 kg 95.6 kg 100.699 kg      Telemetry    SR - Personally Reviewed  ECG    No new - Personally Reviewed  Physical Exam   GEN: No acute distress.   Neck: mild JVD at 30 degrees Cardiac: RRR, no murmurs, rubs, or gallops.  Respiratory: diminished to auscultation bilaterally. GI: Soft, nontender, non-distended  MS: mild edema; No deformity.Lt AKA  Neuro:  Nonfocal  Psych: Normal affect   Labs    High Sensitivity Troponin:   Recent Labs  Lab 02/05/19 1805 02/06/19 0205 02/06/19 0541   TROPONINIHS 59* 59* 49*      Chemistry Recent Labs  Lab 02/05/19 1805  02/08/19 0401 02/09/19 0435 02/10/19 0351  NA 142   < > 144 144 142  K 5.4*   < > 4.8 5.1 4.9  CL 121*   < > 121* 119* 116*  CO2 13*   < > 16* 16* 17*  GLUCOSE 103*   < > 74 76 78  BUN 57*   < > 65* 73* 74*  CREATININE 3.17*   < > 3.42* 3.74* 3.88*  CALCIUM 7.0*   < > 6.9* 7.3* 7.4*  PROT 7.0  --   --   --   --   ALBUMIN 2.4*  --   --   --   --   AST 26  --   --   --   --   ALT 20  --   --   --   --   ALKPHOS 166*  --   --   --   --   BILITOT 0.6  --   --   --   --   GFRNONAA 14*   < > 13* 11* 11*  GFRAA 16*   < > 15*  13* 13*  ANIONGAP 8   < > 7 9 9    < > = values in this interval not displayed.     Hematology Recent Labs  Lab 02/08/19 0401 02/09/19 0435 02/10/19 0351  WBC 11.1* 8.1 8.0  RBC 2.49* 2.38* 2.40*  HGB 7.4* 7.0* 7.0*  HCT 24.1*  23.1* 23.2* 23.3*  MCV 96.8 97.5 97.1  MCH 29.7 29.4 29.2  MCHC 30.7 30.2 30.0  RDW 17.1* 16.8* 16.6*  PLT 273 276 290    BNP Recent Labs  Lab 02/06/19 0313  BNP 1,007.7*     DDimer  Recent Labs  Lab 02/06/19 0205  DDIMER 2.90*     Radiology    US Renal  Result Date: 02/08/2019 CLINICAL DATA:  73 year old female with stage 4 chronic kidney disease. EXAM: RENAL / URINARY TRACT ULTRASOUND COMPLETE COMPARISON:  CT Abdomen and Pelvis 12/30/2015. FINDINGS: Right Kidney: Renal measurements: 8.3 x 4.5 x 4.2 centimeters = volume: 82 mL. Echogenic right kidney difficult to delineate (image 6). No right hydronephrosis or renal mass. Left Kidney: Renal measurements: Could not be identified, probably in part due to echogenic left renal cortex difficult to differentiate from the retroperitoneal fat. Bladder: Diminutive.  Appears normal for degree of bladder distention. IMPRESSION: 1. Echogenic right kidney compatible with chronic medical renal disease. Left kidney could not be identified, also probably due to chronic renal disease. 2. Diminutive and  unremarkable urinary bladder. Electronically Signed   By: Genevie Ann M.D.   On: 02/08/2019 21:59    Cardiac Studies   Echo 02/06/19  IMPRESSIONS   1. Left ventricular ejection fraction, by visual estimation, is 60 to 65%. The left ventricle has normal function. There is moderately increased left ventricular hypertrophy. 2. Left ventricular diastolic parameters are indeterminate. 3. Right ventricular volume and pressure overload. 4. Global right ventricle has mildly reduced systolic function.The right ventricular size is moderately enlarged. 5. Right atrial size was severely dilated. 6. The mitral valve is normal in structure. No evidence of mitral valve regurgitation. 7. The tricuspid valve is normal in structure. Tricuspid valve regurgitation mild-moderate. 8. The aortic valve is tricuspid. Aortic valve regurgitation is mild. 9. Severely elevated pulmonary artery systolic pressure. 10. The tricuspid regurgitant velocity is 4.86 m/s, and with an assumed right atrial pressure of 15 mmHg, the estimated right ventricular systolic pressure is severely elevated at 109.5 mmHg. 11. The inferior vena cava is dilated in size with <50% respiratory variability, suggesting right atrial pressure of 15 mmHg.  FINDINGS Left Ventricle: Left ventricular ejection fraction, by visual estimation, is 60 to 65%. The left ventricle has normal function. The left ventricular internal cavity size was the left ventricle is normal in size. There is moderately increased left  ventricular hypertrophy. Concentric left ventricular hypertrophy. The interventricular septum is flattened in systole and diastole, consistent with right ventricular pressure and volume overload. Left ventricular diastolic parameters are indeterminate.  Right Ventricle: The right ventricular size is moderately enlarged. Right vetricular wall thickness was not assessed. Global RV systolic function is has mildly reduced systolic function.  The tricuspid regurgitant velocity is 4.86 m/s, and with an assumed right atrial pressure of 15 mmHg, the estimated right ventricular systolic pressure is severely elevated at 109.5 mmHg.  Left Atrium: Left atrial size was normal in size.  Right Atrium: Right atrial size was severely dilated  Pericardium: Trivial pericardial effusion is present.  Mitral Valve: The mitral valve is normal in structure. No evidence of mitral valve regurgitation.  Tricuspid Valve:  The tricuspid valve is normal in structure. Tricuspid valve regurgitation mild-moderate.  Aortic Valve: The aortic valve is tricuspid. Aortic valve regurgitation is mild.  Pulmonic Valve: The pulmonic valve was not assessed. Pulmonic valve regurgitation is not visualized.  Aorta: The aortic root is normal in size and structure.  Venous: The inferior vena cava is dilated in size with less than 50% respiratory variability, suggesting right atrial pressure of 15 mmHg.  IAS/Shunts: The interatrial septum was not well visualized.    LEFT VENTRICLE PLAX 2D LVIDd: 3.40 cm Diastology LVIDs: 2.40 cm LV e' lateral: 7.94 cm/s LV PW: 1.40 cm LV E/e' lateral: 10.7 LV IVS: 1.50 cm LV e' medial: 5.55 cm/s LVOT diam: 2.00 cm LV E/e' medial: 15.4 LV SV: 27 ml LV SV Index: 12.18 LVOT Area: 3.14 cm   RIGHT VENTRICLE RV Basal diam: 3.60 cm RV S prime: 9.57 cm/s TAPSE (M-mode): 2.2 cm  LEFT ATRIUM Index RIGHT ATRIUM Index LA diam: 4.50 cm 2.10 cm/m RA Area: 29.70 cm LA Vol (A2C): 60.3 ml 28.17 ml/m RA Volume: 117.00 ml 54.66 ml/m LA Vol (A4C): 45.1 ml 21.07 ml/m LA Biplane Vol: 53.9 ml 25.18 ml/m AORTIC VALVE LVOT Vmax: 112.00 cm/s LVOT Vmean: 61.700 cm/s LVOT VTI: 0.214 m  AORTA Ao Root diam: 3.00 cm  MITRAL VALVE TRICUSPID VALVE MV Area (PHT): 4.80 cm  TR Peak grad: 94.5 mmHg MV PHT: 45.82 msec TR Vmax: 486.00 cm/s MV Decel Time: 158 msec MV E velocity: 85.30 cm/s 103 cm/s SHUNTS MV A velocity: 103.00 cm/s 70.3 cm/s Systemic VTI: 0.21 m MV E/A ratio: 0.83 1.5 Systemic Diam: 2.00 cm   Patient Profile     73 y.o. female with a hx of chronic diastolic CHF, previously moderate (now severe) pumonary HTN, CKD IV, anemia of chronic disease, HTN, remote paroxysmal atrial fibrillation, PAT, COPD, PAD (s/pfemoral to below knee popliteal bypassthen L AKA, now admitted with CHF.   Assessment & Plan    . Acute on chronic diastolic HF -Echo this admit LVEF [id number],  diastolic function, RV pressure and volume overload, mild RV dysfunction, severe RAE, severe pulm HTN PASP 109 mmHg. Neg 2982 ml since admit. Wt down from 100.1 Kg to 95 kg  Now on lasix 120 mg every 8 hours and metolazone. Uptrendin Cr.  3.36>>3.42>>3.74>>3.88  - RV was moderately enlarged, TAPSE 2.2 and RV s' essentially 10 suggesting normal systolic function. Severe pulm HTN PASP 110. If ongoing issues with diuresis may be worth having CHF team see, unclear how much RV could be affecting diuresis vs just significant progression of her undelrying kidney disease.   - we will defer diuretic dosing to nephrology  Yesterday lasix increased to 120 mg TID and zaroxolyn 10 mg daily.      2. AKI on CKD with et. Acidosis on po bicarb. Cr last year 05/2017 2.09, on admit 3.17. Up to 3.74 today Nephrology is following.  Magnesium 1.3 - would consider replacing.  K+ 4.9    3. Severe pulmonary hypertension -I suspect likely related to left sided heart disease and significant volume overload. Would repeat echo once euvolemic to reassess, if persistent could consider further workup.  Need Rt heart cath.   4. PAF - isolated episode some years ago. Recurent episode this admission noted on tele, transient, some SVT - with  anemia issues would not start anticoag at this time.  - currently SR.  5.  Anemia with hgb 7.0 ? Due to Chronic disease but may warrant transfusion.  6.  HTN  On lasix and zaroxolyn and lopressor 50 BID   7.  PAD with PV angiograms and hx of Lt AKA           For questions or updates, please contact [place] Please consult [email or web address] for contact info under        Signed, Cecilie Kicks, NP  02/10/2019, 11:56 AM

## 2019-02-11 LAB — TYPE AND SCREEN
ABO/RH(D): B POS
Antibody Screen: NEGATIVE
Unit division: 0

## 2019-02-11 LAB — LIPID PANEL
Cholesterol: 126 mg/dL (ref 0–200)
HDL: 37 mg/dL — ABNORMAL LOW (ref >40)
LDL Cholesterol: 65 mg/dL (ref 0–99)
Total CHOL/HDL Ratio: 3.4 RATIO
Triglycerides: 118 mg/dL (ref <150)
VLDL: 24 mg/dL (ref 0–40)

## 2019-02-11 LAB — MAGNESIUM: Magnesium: 1.9 mg/dL (ref 1.7–2.4)

## 2019-02-11 LAB — BASIC METABOLIC PANEL
Anion gap: 10 (ref 5–15)
BUN: 77 mg/dL — ABNORMAL HIGH (ref 8–23)
CO2: 18 mmol/L — ABNORMAL LOW (ref 22–32)
Calcium: 7.7 mg/dL — ABNORMAL LOW (ref 8.9–10.3)
Chloride: 113 mmol/L — ABNORMAL HIGH (ref 98–111)
Creatinine, Ser: 3.79 mg/dL — ABNORMAL HIGH (ref 0.44–1.00)
GFR calc Af Amer: 13 mL/min — ABNORMAL LOW (ref >60)
GFR calc non Af Amer: 11 mL/min — ABNORMAL LOW (ref >60)
Glucose, Bld: 78 mg/dL (ref 70–99)
Potassium: 4.7 mmol/L (ref 3.5–5.1)
Sodium: 141 mmol/L (ref 135–145)

## 2019-02-11 LAB — CBC
HCT: 28.8 % — ABNORMAL LOW (ref 36.0–46.0)
Hemoglobin: 9.2 g/dL — ABNORMAL LOW (ref 12.0–15.0)
MCH: 29.8 pg (ref 26.0–34.0)
MCHC: 31.9 g/dL (ref 30.0–36.0)
MCV: 93.2 fL (ref 80.0–100.0)
Platelets: 267 10*3/uL (ref 150–400)
RBC: 3.09 MIL/uL — ABNORMAL LOW (ref 3.87–5.11)
RDW: 16.1 % — ABNORMAL HIGH (ref 11.5–15.5)
WBC: 8.3 10*3/uL (ref 4.0–10.5)
nRBC: 0 % (ref 0.0–0.2)

## 2019-02-11 LAB — BPAM RBC
Blood Product Expiration Date: 202012122359
ISSUE DATE / TIME: 202011101731
Unit Type and Rh: 7300

## 2019-02-11 MED ORDER — ASPIRIN 81 MG PO CHEW
81.0000 mg | CHEWABLE_TABLET | ORAL | Status: AC
  Administered 2019-02-13: 06:00:00 81 mg via ORAL

## 2019-02-11 MED ORDER — SODIUM CHLORIDE 0.9 % IV SOLN
250.0000 mL | INTRAVENOUS | Status: DC | PRN
  Administered 2019-02-11: 22:00:00 250 mL via INTRAVENOUS

## 2019-02-11 MED ORDER — SODIUM CHLORIDE 0.9% FLUSH
3.0000 mL | Freq: Two times a day (BID) | INTRAVENOUS | Status: DC
  Administered 2019-02-12: 3 mL via INTRAVENOUS

## 2019-02-11 MED ORDER — SODIUM CHLORIDE 0.9% FLUSH: 3.0000 mL | INTRAVENOUS | Status: DC | PRN

## 2019-02-11 NOTE — Progress Notes (Signed)
Progress Note  Patient Name: Stephanie Griffith Date of Encounter: 02/11/2019  Primary Cardiologist: Minus Breeding, MD   Subjective   Breathing stable though still cannot lie flat. No chest pain. Discussed RHC, below.  Inpatient Medications    Scheduled Meds: . aspirin  81 mg Oral Daily  . gabapentin  100 mg Oral BID  . heparin  5,000 Units Subcutaneous Q8H  . influenza vaccine adjuvanted  0.5 mL Intramuscular Tomorrow-1000  . lubriderm seriously sensitive   Topical TID  . mouth rinse  15 mL Mouth Rinse BID  . metolazone  10 mg Oral Daily  . metoprolol tartrate  50 mg Oral BID  . sodium bicarbonate  650 mg Oral TID  . sodium chloride flush  3 mL Intravenous Q12H   Continuous Infusions: . sodium chloride Stopped (02/10/19 2223)  . furosemide 120 mg (02/11/19 1400)   PRN Meds: sodium chloride, acetaminophen, hydrALAZINE, ondansetron (ZOFRAN) IV, polyethylene glycol, senna-docusate, sodium chloride flush, zolpidem   Vital Signs    Vitals:   02/10/19 2123 02/11/19 0532 02/11/19 0919 02/11/19 1156  BP: (!) 155/75  (!) 155/63 (!) 169/72  Pulse: 75  76 81  Resp: 20   20  Temp: 97.8 F (36.6 C)   97.6 F (36.4 C)  TempSrc: Oral   Oral  SpO2: 100%  100% 94%  Weight:  90.4 kg    Height:        Intake/Output Summary (Last 24 hours) at 02/11/2019 1404 Last data filed at 02/11/2019 1235 Gross per 24 hour  Intake 1128.62 ml  Output 2900 ml  Net -1771.38 ml   Last 3 Weights 02/11/2019 02/10/2019 02/09/2019  Weight (lbs) 199 lb 4.8 oz 209 lb 7 oz 210 lb 12.2 oz  Weight (kg) 90.402 kg 95 kg 95.6 kg      Telemetry    SR - Personally Reviewed  ECG    No new since 11/7- Personally Reviewed  Physical Exam   GEN: Well nourished, well developed in no acute distress HEENT: Normal, moist mucous membranes NECK: JVD to mid to upper neck at 45 degrees CARDIAC: regular rhythm, normal S1 and S2, no rubs or gallops. No murmurs. VASCULAR: Radial and DP pulses 2+ bilaterally.  No carotid bruits RESPIRATORY:  Lungs diffusely coarse  ABDOMEN: Soft, non-tender, non-distended MUSCULOSKELETAL:  S/p L AKA SKIN: RLE with brawny edema NEUROLOGIC:  Alert and oriented x 3. No focal neuro deficits noted. PSYCHIATRIC:  Normal affect   Labs    High Sensitivity Troponin:   Recent Labs  Lab 02/05/19 1805 02/06/19 0205 02/06/19 0541  TROPONINIHS 59* 59* 49*      Chemistry Recent Labs  Lab 02/05/19 1805  02/09/19 0435 02/10/19 0351 02/11/19 0318  NA 142   < > 144 142 141  K 5.4*   < > 5.1 4.9 4.7  CL 121*   < > 119* 116* 113*  CO2 13*   < > 16* 17* 18*  GLUCOSE 103*   < > 76 78 78  BUN 57*   < > 73* 74* 77*  CREATININE 3.17*   < > 3.74* 3.88* 3.79*  CALCIUM 7.0*   < > 7.3* 7.4* 7.7*  PROT 7.0  --   --   --   --   ALBUMIN 2.4*  --   --   --   --   AST 26  --   --   --   --   ALT 20  --   --   --   --  ALKPHOS 166*  --   --   --   --   BILITOT 0.6  --   --   --   --   GFRNONAA 14*   < > 11* 11* 11*  GFRAA 16*   < > 13* 13* 13*  ANIONGAP 8   < > 9 9 10    < > = values in this interval not displayed.     Hematology Recent Labs  Lab 02/09/19 0435 02/10/19 0351 02/11/19 0318  WBC 8.1 8.0 8.3  RBC 2.38* 2.40* 3.09*  HGB 7.0* 7.0* 9.2*  HCT 23.2* 23.3* 28.8*  MCV 97.5 97.1 93.2  MCH 29.4 29.2 29.8  MCHC 30.2 30.0 31.9  RDW 16.8* 16.6* 16.1*  PLT 276 290 267    BNP Recent Labs  Lab 02/06/19 0313  BNP 1,007.7*     DDimer  Recent Labs  Lab 02/06/19 0205  DDIMER 2.90*     Radiology    No results found.  Cardiac Studies   Echo 02/06/19 1. Left ventricular ejection fraction, by visual estimation, is 60 to 65%. The left ventricle has normal function. There is moderately increased left ventricular hypertrophy. 2. Left ventricular diastolic parameters are indeterminate. 3. Right ventricular volume and pressure overload. 4. Global right ventricle has mildly reduced systolic function.The right ventricular size is moderately enlarged.  5. Right atrial size was severely dilated. 6. The mitral valve is normal in structure. No evidence of mitral valve regurgitation. 7. The tricuspid valve is normal in structure. Tricuspid valve regurgitation mild-moderate. 8. The aortic valve is tricuspid. Aortic valve regurgitation is mild. 9. Severely elevated pulmonary artery systolic pressure. 10. The tricuspid regurgitant velocity is 4.86 m/s, and with an assumed right atrial pressure of 15 mmHg, the estimated right ventricular systolic pressure is severely elevated at 109.5 mmHg. 11. The inferior vena cava is dilated in size with <50% respiratory variability, suggesting right atrial pressure of 15 mmHg.   Patient Profile     73 y.o. female with a hx of chronic diastolic heart failure, previously moderate (now severe) pulmonary HTN, CKD IV, anemia of chronic disease, HTN, remote paroxysmal atrial fibrillation, COPD, PAD (s/pfemoral to below knee popliteal bypassthen L AKA), now admitted with acute on chronic diastolic heart failure  Assessment & Plan    Acute on chronic diastolic HF: my concern is that this may be more right sided heart failure and pulmonary hypertension as opposed to left sided diastolic heart failure -please see my note from 11/9. Her E/e' and LA size do not support that this is diastolic heart failure alone. My concern is for [facility] as a primary cause. Her RV and TR on most recent echo are worse than 2017. -I've discussed with Dr. Haroldine Laws. Will plan for RHC later this week -appreciate nephrology management of diuresis given her acute kidney injury in the setting of chronic kidney disease. RHC will also help determining filling pressures/how much more diuresis she needs.   Paroxysmal afib -isolated episode some years ago. Possible recurent episode this admission noted on tele, transient, some SVT -with anemia issues would not start anticoag at this time.  -currently SR -CHA2DS2/VAS Stroke Risk Points=5  -continue metoprolol  HTN: had been well controlled, though somewhat elevated today -continue diuresis -continue metoprolol -has hydralazine PRN. Was on amlodipine as outpatient. May need to add back. Unlikely to change her massive, chronic venous stasis LE edema  PAD, hx of Lt AKA  -continue aspirin -not currently on statin, unclear reason why not.    -  no recent lipids, will add to AM labs  For questions or updates, please contact [place] Please consult [email or web address] for contact info under     Signed, Buford Dresser, MD  02/11/2019, 2:04 PM

## 2019-02-11 NOTE — Progress Notes (Signed)
[facility] Progress Note  Subjective:   No c/o  1.4 L net negative yesterday, 2.4 L UOP, 5 kg negative  Stable creatinine 3.8, K4.7, bicarbonate 18  Currently on Lasix 120 mg IV 3 times daily, metolazone 10 mg daily  On room air, blood pressure stable  She feels less bloated and full, edema is slowly resolving  Vitals:   02/10/19 2123 02/11/19 0532 02/11/19 0919 02/11/19 1156  BP: (!) 155/75  (!) 155/63 (!) 169/72  Pulse: 75  76 81  Resp: 20   20  Temp: 97.8 F (36.6 C)   97.6 F (36.4 C)  TempSrc: Oral   Oral  SpO2: 100%  100% 94%  Weight:  90.4 kg    Height:        Inpatient medications: . aspirin  81 mg Oral Daily  . gabapentin  100 mg Oral BID  . heparin  5,000 Units Subcutaneous Q8H  . influenza vaccine adjuvanted  0.5 mL Intramuscular Tomorrow-1000  . lubriderm seriously sensitive   Topical TID  . mouth rinse  15 mL Mouth Rinse BID  . metolazone  10 mg Oral Daily  . metoprolol tartrate  50 mg Oral BID  . sodium bicarbonate  650 mg Oral TID  . sodium chloride flush  3 mL Intravenous Q12H   . sodium chloride Stopped (02/10/19 2223)  . furosemide Stopped (02/11/19 0629)   sodium chloride, acetaminophen, hydrALAZINE, ondansetron (ZOFRAN) IV, polyethylene glycol, senna-docusate, sodium chloride flush, zolpidem    Exam: Gen  NAD, pleasant, on nasal cannula +JVD Chest mostly clear, occ rales , no wheezing, no ^wob RRR no MRG Soft, nontender GU purewick draining pale yellow urine Left AKA, right lower extremity with scaling and hyperpigmentation consistent with chronic venous stasis Neuro is alert, Ox 3 , nf    Home meds:  - amlodipine 10/ metoprolol 50 bid  - gabapentin 100 bid  - aspirin 81      Date             Creat               eGFR  2009- 10         1.93- 3.11  2013               1.46- 2.97          2017               1.66- 2.79        16- 30  Feb 2019        2.06                 23  02/05/2019       3.17                  16, stage IV  02/07/2019       3.36                 15, stage IV-V    Na 145 K 5.0  CO2 13  BUN 64  Cr 3.36  Mg 1.0 Ca 6.9  Alb 2.4   WBC 10k  Hb 7.5  plt 293   CXR 11/5 > Cardiomegaly and pulmonary vascular congestion without overt edema  ECHO - LVEF 60%, RV pressure/ vol overload, RV slightly reduced fxn. RA severely dilated, severe ^RV sys 155m Hg. IVC dilated w/ low resp variability suggesting RA pressure 168mHg.   Assessment/ Plan: 1. Renal  failure - since 2013 - Feb 2019 patient has had mostly CKD IV disease w/ creat 1.6- 2.5.  Now creat 3's.  Favor progressive CKD but could have a component of AKI.Marland Kitchen No uremic symptoms.    GFR stable, patient is slowly diuresing, continue lasix 120 tid and add po zaroxlyn 10 qd.  2. Pulm edema - w/ SOB, vol overload, LE edema. Diuresing as above, weights improving, now on room air 3. HTN - cont meds BP's reasonably well controlled 4. Met acidosis - po bicarb started, folow 5. SP L AKA 6. Obesity 7. Pulm HTN 8. COPD 9. Chron diast HF, and likely right-sided heart failure, per cardiology 10. Anemia, transfuse as indicated 11. Hypomagnesemia, Diuretic/diet related, replete aggressively  Rexene Agent  02/11/2019, 12:26 PM  Iron/TIBC/Ferritin/ %Sat    Component Value Date/Time   IRON 27 (L) 02/08/2019 0401   TIBC 165 (L) 02/08/2019 0401   FERRITIN 73 02/08/2019 0401   IRONPCTSAT 16 02/08/2019 0401   Recent Labs  Lab 02/05/19 1805  02/11/19 0318  NA 142   < > 141  K 5.4*   < > 4.7  CL 121*   < > 113*  CO2 13*   < > 18*  GLUCOSE 103*   < > 78  BUN 57*   < > 77*  CREATININE 3.17*   < > 3.79*  CALCIUM 7.0*   < > 7.7*  ALBUMIN 2.4*  --   --    < > = values in this interval not displayed.   Recent Labs  Lab 02/05/19 1805  AST 26  ALT 20  ALKPHOS 166*  BILITOT 0.6  PROT 7.0   Recent Labs  Lab 02/11/19 0318  WBC 8.3  HGB 9.2*  HCT 28.8*  PLT 267

## 2019-02-11 NOTE — Progress Notes (Signed)
PROGRESS NOTE    Stephanie Griffith  [id number] DOB: November 11, 1945 DOA: 02/05/2019 PCP: [name], MD   Brief Narrative:  73 y.o. female with medical history significant for chronic kidney disease stage IV, peripheral vascular disease status post left AKA, hypertension, chronic anemia, and chronic diastolic CHF, now presenting to the emergency department with 1 week of progressive leg swelling and shortness of breath.   Found to be in CHF exacerbation complicated by CKD.   Assessment & Plan:   Principal Problem:   Acute on chronic diastolic CHF (congestive heart failure) (HCC) Active Problems:   Normocytic anemia   Essential hypertension, benign   Hyperkalemia   Chronic renal insufficiency, stage IV (severe) (HCC)   Metabolic acidosis  Acute on chronic congestive heart failure with preserved ejection fraction, class III -She needs off of supplemental oxygen and currently doing well on room air -Echocardiogram 11/6-60 to 65%, right ventricular volume and pressure overload. - Following adequate diuresis,  If evidence of PAH, would get advanced heart failure to assist with medications. May need Rt heart Cath per Cardio  -  appreciate Cardio input  - Cont lasix 120mg  3 times daily and Zaroxolyn 10mg  daily per Nephrology; appreciate Nephro care  - ~1.8L negatively balanced over the past 24 hours; weight also degreasing slowly  - Cont daily ins and outs monitoring, fluid restriction to 1.5L, salt restriction to less than 2 g a day -Monitor electrolytes and replete as necessary  Acute kidney injury on chronic kidney disease stage IV -Baseline 2.0.  Admission creatinine 3.1, trended up to 3.8.  May be close to baseline  -Latest FENa of 11.1% which predicts to be post renal cause -Renal ultrasound: 1. Echogenic right kidney compatible with chronic medical renal disease. Left kidney could not be identified, also probably due to chronic renal disease. 2. Diminutive and unremarkable urinary  bladder. - Cont diuresis as per Renal  - Follow Renal recs; Appreciated    Essential hypertension -Getting aggressive diuretics.  Will adjust cardiac regimen as appropriate  Anemia of chronic disease -Baseline hemoglobin 8.0.  S/P transfusion of 1 unit on 11/10. Stable now post transfusion  - Pt has no S/S at this time  - Continue to closely monitor; may need to transfuse if further drops or symptomatic - Also to avoid volume overload   Elevated D-dimer -Lower extremity Dopplers done-negative.  Unable to perform CTA chest given renal dysfunction.  Low suspicion for PE.  Lower Ext Skin dryness:  - improving  -Likely from extreme diuresis on top of her baseline condition - Started lanolin lotion 3 times daily -Continue moisturization  DVT prophylaxis: Subcutaneous heparin Code Status: Full code Family Communication: None at bedside Disposition Plan: Maintain hospital stay for diuresis with caution while monitoring renal function.  Consultants:   Cardiology  Nephrology   Procedures:   None  Antimicrobials:   None   Subjective: Was lying on bed; Still having some exertional shortness of breath.  Does not use any oxygen at home. B/L lower Ext skin dryness.   Review of Systems Otherwise negative except as per HPI, including: General: Denies fever, chills, night sweats or unintended weight loss. Resp: Denies cough, wheezing Cardiac: Denies chest pain, palpitations, orthopnea, paroxysmal nocturnal dyspnea. GI: Denies abdominal pain, nausea, vomiting, diarrhea or constipation GU: Denies dysuria, frequency, hesitancy or incontinence MS: Denies muscle aches, joint pain or swelling Neuro: Denies headache, neurologic deficits (focal weakness, numbness, tingling), abnormal gait Psych: Denies anxiety, depression, SI/HI/AVH Skin: Denies new rashes or lesions  ID: Denies sick contacts, exotic exposures, travel  Objective: Vitals:   02/10/19 2123 02/11/19 0532 02/11/19 0919  02/11/19 1156  BP: (!) 155/75  (!) 155/63 (!) 169/72  Pulse: 75  76 81  Resp: 20   20  Temp: 97.8 F (36.6 C)   97.6 F (36.4 C)  TempSrc: Oral   Oral  SpO2: 100%  100% 94%  Weight:  90.4 kg    Height:        Intake/Output Summary (Last 24 hours) at 02/11/2019 1302 Last data filed at 02/11/2019 1235 Gross per 24 hour  Intake 1128.62 ml  Output 2900 ml  Net -1771.38 ml   Filed Weights   02/09/19 0500 02/10/19 0419 02/11/19 0532  Weight: 95.6 kg 95 kg 90.4 kg    Examination:  General exam: Appears calm and comfortable, 2 L nasal cannula Respiratory system: Bilateral crackles at the bases Cardiovascular system: S1 & S2 heard, RRR. No JVD, murmurs, rubs, gallops or clicks. No pedal edema. Gastrointestinal system: Abdomen is nondistended, soft and nontender. No organomegaly or masses felt. Normal bowel sounds heard. Central nervous system: Alert and oriented. No focal neurological deficits. Extremities: Symmetric 5 x 5 power. Skin: No rashes, lesions or ulcers Psychiatry: Judgement and insight appear normal. Mood & affect appropriate.     Data Reviewed:   CBC: Recent Labs  Lab 02/05/19 1805  02/06/19 1104 02/07/19 0342 02/08/19 0401 02/09/19 0435 02/10/19 0351 02/11/19 0318  WBC 12.4*  --  13.1* 10.0 11.1* 8.1 8.0 8.3  NEUTROABS 9.0*  --  10.7*  --   --   --   --   --   HGB 8.4*   < > 8.1* 7.5* 7.4* 7.0* 7.0* 9.2*  HCT 27.9*   < > 26.9* 25.1* 24.1*  23.1* 23.2* 23.3* 28.8*  MCV 99.3  --  97.8 97.3 96.8 97.5 97.1 93.2  PLT 342  --  307 293 273 276 290 267   < > = values in this interval not displayed.   Basic Metabolic Panel: Recent Labs  Lab 02/07/19 0342 02/08/19 0401 02/09/19 0435 02/10/19 0351 02/11/19 0318  NA 145 144 144 142 141  K 5.0 4.8 5.1 4.9 4.7  CL 123* 121* 119* 116* 113*  CO2 13* 16* 16* 17* 18*  GLUCOSE 73 74 76 78 78  BUN 64* 65* 73* 74* 77*  CREATININE 3.36* 3.42* 3.74* 3.88* 3.79*  CALCIUM 6.9* 6.9* 7.3* 7.4* 7.7*  MG 1.0* 1.4*  1.3* 1.3* 1.9   GFR: Estimated Creatinine Clearance: 15.5 mL/min (A) (by C-G formula based on SCr of 3.79 mg/dL (H)). Liver Function Tests: Recent Labs  Lab 02/05/19 1805  AST 26  ALT 20  ALKPHOS 166*  BILITOT 0.6  PROT 7.0  ALBUMIN 2.4*   No results for input(s): LIPASE, AMYLASE in the last 168 hours. No results for input(s): AMMONIA in the last 168 hours. Coagulation Profile: No results for input(s): INR, PROTIME in the last 168 hours. Cardiac Enzymes: No results for input(s): CKTOTAL, CKMB, CKMBINDEX, TROPONINI in the last 168 hours. BNP (last 3 results) No results for input(s): PROBNP in the last 8760 hours. HbA1C: No results for input(s): HGBA1C in the last 72 hours. CBG: No results for input(s): GLUCAP in the last 168 hours. Lipid Profile: No results for input(s): CHOL, HDL, LDLCALC, TRIG, CHOLHDL, LDLDIRECT in the last 72 hours. Thyroid Function Tests: No results for input(s): TSH, T4TOTAL, FREET4, T3FREE, THYROIDAB in the last 72 hours. Anemia Panel: No results for  input(s): VITAMINB12, FOLATE, FERRITIN, TIBC, IRON, RETICCTPCT in the last 72 hours. Sepsis Labs: No results for input(s): PROCALCITON, LATICACIDVEN in the last 168 hours.  Recent Results (from the past 240 hour(s))  SARS CORONAVIRUS 2 (TAT 6-24 HRS) Nasopharyngeal Nasopharyngeal Swab     Status: None   Collection Time: 02/06/19  4:36 AM   Specimen: Nasopharyngeal Swab  Result Value Ref Range Status   SARS Coronavirus 2 NEGATIVE NEGATIVE Final    Comment: (NOTE) SARS-CoV-2 target nucleic acids are NOT DETECTED. The SARS-CoV-2 RNA is generally detectable in upper and lower respiratory specimens during the acute phase of infection. Negative results do not preclude SARS-CoV-2 infection, do not rule out co-infections with other pathogens, and should not be used as the sole basis for treatment or other patient management decisions. Negative results must be combined with clinical observations, patient  history, and epidemiological information. The expected result is Negative. Fact Sheet for Patients: SugarRoll.be Fact Sheet for Healthcare Providers: https://www.woods-mathews.com/ This test is not yet approved or cleared by the [place] FDA and  has been authorized for detection and/or diagnosis of SARS-CoV-2 by FDA under an Emergency Use Authorization (EUA). This EUA will remain  in effect (meaning this test can be used) for the duration of the COVID-19 declaration under Section 56 4(b)(1) of the Act, 21 U.S.C. section 360bbb-3(b)(1), unless the authorization is terminated or revoked sooner. Performed at [facility], [place] [address], [place], [place] [address]          Radiology Studies: No results found.      Scheduled Meds: . aspirin  81 mg Oral Daily  . gabapentin  100 mg Oral BID  . heparin  5,000 Units Subcutaneous Q8H  . influenza vaccine adjuvanted  0.5 mL Intramuscular Tomorrow-1000  . lubriderm seriously sensitive   Topical TID  . mouth rinse  15 mL Mouth Rinse BID  . metolazone  10 mg Oral Daily  . metoprolol tartrate  50 mg Oral BID  . sodium bicarbonate  650 mg Oral TID  . sodium chloride flush  3 mL Intravenous Q12H   Continuous Infusions: . sodium chloride Stopped (02/10/19 2223)  . furosemide Stopped (02/11/19 0629)     LOS: 5 days   Time spent= 35 mins    Thornell Mule, MD [facility]  If 7PM-7AM, please contact night-coverage  02/11/2019, 1:02 PM

## 2019-02-12 DIAGNOSIS — R778 Other specified abnormalities of plasma proteins: Secondary | ICD-10-CM

## 2019-02-12 LAB — BASIC METABOLIC PANEL
Anion gap: 13 (ref 5–15)
BUN: 79 mg/dL — ABNORMAL HIGH (ref 8–23)
CO2: 21 mmol/L — ABNORMAL LOW (ref 22–32)
Calcium: 7.8 mg/dL — ABNORMAL LOW (ref 8.9–10.3)
Chloride: 107 mmol/L (ref 98–111)
Creatinine, Ser: 4.01 mg/dL — ABNORMAL HIGH (ref 0.44–1.00)
GFR calc Af Amer: 12 mL/min — ABNORMAL LOW (ref >60)
GFR calc non Af Amer: 10 mL/min — ABNORMAL LOW (ref >60)
Glucose, Bld: 84 mg/dL (ref 70–99)
Potassium: 4.3 mmol/L (ref 3.5–5.1)
Sodium: 141 mmol/L (ref 135–145)

## 2019-02-12 LAB — MAGNESIUM: Magnesium: 1.7 mg/dL (ref 1.7–2.4)

## 2019-02-12 MED ORDER — AMLODIPINE BESYLATE 10 MG PO TABS
10.0000 mg | ORAL_TABLET | Freq: Every day | ORAL | Status: DC
  Administered 2019-02-12 – 2019-02-18 (×7): 10 mg via ORAL
  Filled 2019-02-12 (×7): qty 1

## 2019-02-12 MED ORDER — ATORVASTATIN CALCIUM 40 MG PO TABS
40.0000 mg | ORAL_TABLET | Freq: Every day | ORAL | Status: DC
  Administered 2019-02-12 – 2019-02-17 (×6): 40 mg via ORAL
  Filled 2019-02-12 (×6): qty 1

## 2019-02-12 NOTE — Progress Notes (Signed)
Nutrition Education Follow-Up Note  RD re-attempted education today (RD previously attempted education while working remotely, however, pt did no answer phone). Pt was sleeping soundly at time of visit and did not arouse when name was being called. Education materials ("Low Sodium Nutrition Therapy", "Sodium Free Seasoning Tips", and "Fluid Restriction Nutrition Therapy" handouts from [facility] Nutrition Care Manual) have been provided through AVS/ Discharge summary. RD will sign off, however, if further nutrition issues arise, please re-consult RD.   Jennye Runquist A. Jimmye Norman, RD, LDN, [place] Registered Dietitian II Certified Diabetes Care and Education Specialist Pager: [phone] After hours Pager: [phone]

## 2019-02-12 NOTE — Progress Notes (Signed)
Progress Note  Patient Name: Stephanie Griffith Date of Encounter: 02/12/2019  Primary Cardiologist: Stephanie Breeding, MD   Subjective   Breathing stable. She is impressed that her weight continues to drop. No chest pain, breathing comfortably on room air though still mildly short of breath when flat for a period of time. Reviewed plans for RHC tomorrow, she is amenable.  Inpatient Medications    Scheduled Meds: . amLODipine  10 mg Oral Daily  . aspirin  81 mg Oral Daily  . [START ON 02/13/2019] aspirin  81 mg Oral Pre-Cath  . atorvastatin  40 mg Oral q1800  . gabapentin  100 mg Oral BID  . heparin  5,000 Units Subcutaneous Q8H  . influenza vaccine adjuvanted  0.5 mL Intramuscular Tomorrow-1000  . lubriderm seriously sensitive   Topical TID  . mouth rinse  15 mL Mouth Rinse BID  . metolazone  10 mg Oral Daily  . metoprolol tartrate  50 mg Oral BID  . sodium bicarbonate  650 mg Oral TID  . sodium chloride flush  3 mL Intravenous Q12H  . sodium chloride flush  3 mL Intravenous Q12H   Continuous Infusions: . sodium chloride 250 mL (02/12/19 0537)  . sodium chloride Stopped (02/12/19 0011)  . furosemide 120 mg (02/12/19 0537)   PRN Meds: sodium chloride, sodium chloride, acetaminophen, hydrALAZINE, ondansetron (ZOFRAN) IV, polyethylene glycol, senna-docusate, sodium chloride flush, sodium chloride flush, zolpidem   Vital Signs    Vitals:   02/12/19 0325 02/12/19 0336 02/12/19 0855 02/12/19 1140  BP:  (!) 166/75 (!) 170/79 (!) 141/68  Pulse:  82 77 72  Resp:  18  18  Temp:  98.3 F (36.8 C) 98.2 F (36.8 C) 98 F (36.7 C)  TempSrc:  Oral Oral Oral  SpO2:  98% 97% 95%  Weight: 86.7 kg     Height:        Intake/Output Summary (Last 24 hours) at 02/12/2019 1217 Last data filed at 02/12/2019 1141 Gross per 24 hour  Intake 1066.83 ml  Output 4625 ml  Net -3558.17 ml   Last 3 Weights 02/12/2019 02/11/2019 02/10/2019  Weight (lbs) 191 lb 2.2 oz 199 lb 4.8 oz 209 lb 7 oz   Weight (kg) 86.7 kg 90.402 kg 95 kg      Telemetry    SR - Personally Reviewed  ECG    No new since 11/7- Personally Reviewed  Physical Exam   GEN: Well nourished, well developed in no acute distress HEENT: Normal, moist mucous membranes NECK: JVD just above clavicle at 45 degrees CARDIAC: regular rhythm, normal S1 and S2, no rubs or gallops. No murmurs. VASCULAR: Radial pulses 2+ bilaterally.  RESPIRATORY:  Lungs clearing, mildly coarse ABDOMEN: Soft, non-tender, non-distended MUSCULOSKELETAL:  S/p l aka SKIN: RLE with brawny edema but wrinkling/reduced girth today NEUROLOGIC:  Alert and oriented x 3. No focal neuro deficits noted. PSYCHIATRIC:  Normal affect   Labs    High Sensitivity Troponin:   Recent Labs  Lab 02/05/19 1805 02/06/19 0205 02/06/19 0541  TROPONINIHS 59* 59* 49*      Chemistry Recent Labs  Lab 02/05/19 1805  02/10/19 0351 02/11/19 0318 02/12/19 0805  NA 142   < > 142 141 141  K 5.4*   < > 4.9 4.7 4.3  CL 121*   < > 116* 113* 107  CO2 13*   < > 17* 18* 21*  GLUCOSE 103*   < > 78 78 84  BUN 57*   < >  74* 77* 79*  CREATININE 3.17*   < > 3.88* 3.79* 4.01*  CALCIUM 7.0*   < > 7.4* 7.7* 7.8*  PROT 7.0  --   --   --   --   ALBUMIN 2.4*  --   --   --   --   AST 26  --   --   --   --   ALT 20  --   --   --   --   ALKPHOS 166*  --   --   --   --   BILITOT 0.6  --   --   --   --   GFRNONAA 14*   < > 11* 11* 10*  GFRAA 16*   < > 13* 13* 12*  ANIONGAP 8   < > 9 10 13    < > = values in this interval not displayed.     Hematology Recent Labs  Lab 02/09/19 0435 02/10/19 0351 02/11/19 0318  WBC 8.1 8.0 8.3  RBC 2.38* 2.40* 3.09*  HGB 7.0* 7.0* 9.2*  HCT 23.2* 23.3* 28.8*  MCV 97.5 97.1 93.2  MCH 29.4 29.2 29.8  MCHC 30.2 30.0 31.9  RDW 16.8* 16.6* 16.1*  PLT 276 290 267    BNP Recent Labs  Lab 02/06/19 0313  BNP 1,007.7*     DDimer  Recent Labs  Lab 02/06/19 0205  DDIMER 2.90*     Radiology    No results found.   Cardiac Studies   Echo 02/06/19 1. Left ventricular ejection fraction, by visual estimation, is 60 to 65%. The left ventricle has normal function. There is moderately increased left ventricular hypertrophy. 2. Left ventricular diastolic parameters are indeterminate. 3. Right ventricular volume and pressure overload. 4. Global right ventricle has mildly reduced systolic function.The right ventricular size is moderately enlarged. 5. Right atrial size was severely dilated. 6. The mitral valve is normal in structure. No evidence of mitral valve regurgitation. 7. The tricuspid valve is normal in structure. Tricuspid valve regurgitation mild-moderate. 8. The aortic valve is tricuspid. Aortic valve regurgitation is mild. 9. Severely elevated pulmonary artery systolic pressure. 10. The tricuspid regurgitant velocity is 4.86 m/s, and with an assumed right atrial pressure of 15 mmHg, the estimated right ventricular systolic pressure is severely elevated at 109.5 mmHg. 11. The inferior vena cava is dilated in size with <50% respiratory variability, suggesting right atrial pressure of 15 mmHg.   Patient Profile     73 y.o. female with a hx of chronic diastolic heart failure, previously moderate (now severe) pulmonary HTN, CKD IV, anemia of chronic disease, HTN, remote paroxysmal atrial fibrillation, COPD, PAD (s/pfemoral to below knee popliteal bypassthen L AKA), now admitted with acute on chronic diastolic heart failure  Assessment & Plan    Acute on chronic diastolic HF: my concern is that this may be more right sided heart failure and pulmonary hypertension as opposed to left sided diastolic heart failure -please see my note from 11/9. Her E/e' and LA size do not support that this is diastolic heart failure alone. My concern is for [facility] as a primary cause. Her RV and TR on most recent echo are worse than 2017. -RHC scheduled with Dr. Haroldine Griffith tomorrow AM -appreciate nephrology management  of diuresis given her acute kidney injury in the setting of chronic kidney disease. RHC will also help determining filling pressures/how much more diuresis she needs.  -net negative 7.4 L, wt today 86.7 kg (peak 100.7 kg) -Cr 4.01, slightly  up. On furosemide 120 mg q8 hours, metolazone 10 mg daily  Paroxysmal afib -isolated episode some years ago. Possible recurent episode this admission noted on tele, transient, some SVT -with anemia issues would not start anticoag at this time.  -currently SR -CHA2DS2/VAS Stroke Risk Points=5 -continue metoprolol 50 mg BID  HTN: more elevated today -continue diuresis -continue metoprolol -has hydralazine PRN.  -adding back amlodipine today. Unlikely to worsen her chronic venous stasis LE edema  PAD, hx of Lt AKA  -continue aspirin -not currently on statin, unclear reason why not.    -lipids added yesterday, LDL 65. Below goal of 70. With CKD, PAD, risk factors would like statin. Has been on atorvastatin 40 mg in the past, will restart.  For questions or updates, please contact [place] Please consult [email or web address] for contact info under     Signed, Buford Dresser, MD  02/12/2019, 12:17 PM

## 2019-02-12 NOTE — Progress Notes (Signed)
[facility] Progress Note  Subjective:   No c/o, on RA  BP stable  4.5L UOP yesterda, 3.4 neg, 7.5 since admission  Weights down 30lb  SCr slight worsenign to, K and HCO3 ok  Vitals:   02/12/19 0325 02/12/19 0336 02/12/19 0855 02/12/19 1140  BP:  (!) 166/75 (!) 170/79 (!) 141/68  Pulse:  82 77 72  Resp:  18  18  Temp:  98.3 F (36.8 C) 98.2 F (36.8 C) 98 F (36.7 C)  TempSrc:  Oral Oral Oral  SpO2:  98% 97% 95%  Weight: 86.7 kg     Height:        Inpatient medications: . amLODipine  10 mg Oral Daily  . aspirin  81 mg Oral Daily  . [START ON 02/13/2019] aspirin  81 mg Oral Pre-Cath  . atorvastatin  40 mg Oral q1800  . gabapentin  100 mg Oral BID  . heparin  5,000 Units Subcutaneous Q8H  . influenza vaccine adjuvanted  0.5 mL Intramuscular Tomorrow-1000  . lubriderm seriously sensitive   Topical TID  . mouth rinse  15 mL Mouth Rinse BID  . metolazone  10 mg Oral Daily  . metoprolol tartrate  50 mg Oral BID  . sodium bicarbonate  650 mg Oral TID  . sodium chloride flush  3 mL Intravenous Q12H  . sodium chloride flush  3 mL Intravenous Q12H   . sodium chloride 250 mL (02/12/19 0537)  . sodium chloride Stopped (02/12/19 0011)  . furosemide 120 mg (02/12/19 1236)   sodium chloride, sodium chloride, acetaminophen, hydrALAZINE, ondansetron (ZOFRAN) IV, polyethylene glycol, senna-docusate, sodium chloride flush, sodium chloride flush, zolpidem    Exam: Gen  NAD, pleasant, on nasal cannula +JVD Chest mostly clear, occ rales , no wheezing, no ^wob RRR no MRG Soft, nontender GU purewick draining pale yellow urine Left AKA, right lower extremity with scaling and hyperpigmentation consistent with chronic venous stasis Neuro is alert, Ox 3 , nf    Home meds:  - amlodipine 10/ metoprolol 50 bid  - gabapentin 100 bid  - aspirin 81      Date             Creat               eGFR  2009- 10         1.93- 3.11  2013               1.46- 2.97           2017               1.66- 2.79        16- 30  Feb 2019        2.06                 23  02/05/2019       3.17                 16, stage IV  02/07/2019       3.36                 15, stage IV-V    Na 145 K 5.0  CO2 13  BUN 64  Cr 3.36  Mg 1.0 Ca 6.9  Alb 2.4   WBC 10k  Hb 7.5  plt 293   CXR 11/5 > Cardiomegaly and pulmonary vascular congestion without overt edema  ECHO - LVEF 60%, RV pressure/ vol  overload, RV slightly reduced fxn. RA severely dilated, severe ^RV sys 134m Hg. IVC dilated w/ low resp variability suggesting RA pressure 159mHg.   Assessment/ Plan: 1. Renal failure - since 2013 - Feb 2019 patient has had mostly CKD IV disease w/ creat 1.6- 2.5.  Now creat 3's.  Favor progressive CKD.  No uremic symptoms.    GFR fairly stable, patient is slowly diuresing, continue lasix 120 tid and add po zaroxlyn 10 qd.  2. Pulm edema - w/ SOB, vol overload, LE edema. Diuresing as above, weights improving, now on room air; CV following, RHC 11/13 3. HTN - cont meds BP's reasonably well controlled 4. Met acidosis - po bicarb started, stable at 21 5. SP L AKA 6. Obesity 7. Pulm HTN 8. COPD 9. Chron diast HF, and likely right-sided heart failure, per cardiology 10. Anemia, transfuse as indicated 11. Hypomagnesemia, Diuretic/diet related, replete aggressively  RyRexene Agent11/03/2019, 4:10 PM  Iron/TIBC/Ferritin/ %Sat    Component Value Date/Time   IRON 27 (L) 02/08/2019 0401   TIBC 165 (L) 02/08/2019 0401   FERRITIN 73 02/08/2019 0401   IRONPCTSAT 16 02/08/2019 0401   Recent Labs  Lab 02/05/19 1805  02/12/19 0805  NA 142   < > 141  K 5.4*   < > 4.3  CL 121*   < > 107  CO2 13*   < > 21*  GLUCOSE 103*   < > 84  BUN 57*   < > 79*  CREATININE 3.17*   < > 4.01*  CALCIUM 7.0*   < > 7.8*  ALBUMIN 2.4*  --   --    < > = values in this interval not displayed.   Recent Labs  Lab 02/05/19 1805  AST 26  ALT 20  ALKPHOS 166*  BILITOT 0.6  PROT 7.0   Recent Labs  Lab  02/11/19 0318  WBC 8.3  HGB 9.2*  HCT 28.8*  PLT 267

## 2019-02-12 NOTE — Progress Notes (Signed)
PROGRESS NOTE    Stephanie Griffith  [name] DOB: 09/30/1945 DOA: 02/05/2019 PCP: [name], MD   Brief Narrative:  73 y.o. female with medical history significant for chronic kidney disease stage IV, peripheral vascular disease status post left AKA, hypertension, chronic anemia, and chronic diastolic CHF, now presenting to the emergency department with 1 week of progressive leg swelling and shortness of breath.   Found to be in CHF exacerbation complicated by CKD.   Assessment & Plan:   Principal Problem:   Acute on chronic diastolic CHF (congestive heart failure) (HCC) Active Problems:   Normocytic anemia   Essential hypertension, benign   Hyperkalemia   Chronic renal insufficiency, stage IV (severe) (HCC)   Metabolic acidosis  Acute on chronic congestive heart failure with preserved ejection fraction, class III -She needs off of supplemental oxygen and currently doing well on room air -Echocardiogram 11/6-60 to 65%, right ventricular volume and pressure overload. - Following adequate diuresis,  If evidence of PAH, would get advanced heart failure to assist with medications. May need Rt heart Cath per Cardio later this week  -  appreciate Cardio input  - Cont lasix 120mg  3 times daily and Zaroxolyn 10mg  daily per Nephrology; appreciate Nephro care  - ~3L negatively balanced over the past 24 hours; weight also degreasing slowly  - Cont daily ins and outs monitoring, fluid restriction to 1.5L, salt restriction to less than 2 g a day -Monitor electrolytes and replete as necessary  Acute kidney injury on chronic kidney disease stage IV -Baseline 2.0.  Admission creatinine 3.1, trended up to 4 today.    -Latest FENa of 11.1% which predicts to be post renal cause -Renal ultrasound: 1. Echogenic right kidney compatible with chronic medical renal disease. Left kidney could not be identified, also probably due to chronic renal disease. 2. Diminutive and unremarkable urinary  bladder. - Cont diuresis as per Renal  - Follow Renal recs; Appreciated    Essential hypertension -Getting aggressive diuretics.  Will adjust cardiac regimen as appropriate  Anemia of chronic disease -Baseline hemoglobin 8.0.  S/P transfusion of 1 unit on 11/10. Stable now post transfusion  - Pt has no S/S at this time  - Continue to closely monitor; may need to transfuse if further drops or symptomatic - Also to avoid volume overload   Elevated D-dimer -Lower extremity Dopplers done-negative.  Unable to perform CTA chest given renal dysfunction.  Low suspicion for PE.  Lower Ext Skin dryness:  - improving  -Likely from extreme diuresis on top of her baseline condition - Started lanolin lotion 3 times daily -Continue moisturization  DVT prophylaxis: Subcutaneous heparin Code Status: Full code Family Communication: None at bedside Disposition Plan: Maintain hospital stay for diuresis with caution while monitoring renal function.  Consultants:   Cardiology  Nephrology   Procedures:   None  Antimicrobials:   None   Subjective: No acute issues overnight or this AM. Was lying on bed; Still having some exertional shortness of breath.  Does not use any oxygen at home.   Review of Systems Otherwise negative except as per HPI, including: General: Denies fever, chills, night sweats or unintended weight loss. Resp: Denies cough, wheezing Cardiac: Denies chest pain, palpitations, orthopnea, paroxysmal nocturnal dyspnea. GI: Denies abdominal pain, nausea, vomiting, diarrhea or constipation GU: Denies dysuria, frequency, hesitancy or incontinence MS: Denies muscle aches, joint pain or swelling Neuro: Denies headache, neurologic deficits (focal weakness, numbness, tingling), abnormal gait Psych: Denies anxiety, depression, SI/HI/AVH Skin: Denies new rashes  or lesions ID: Denies sick contacts, exotic exposures, travel  Objective: Vitals:   02/11/19 2002 02/12/19 0325  02/12/19 0336 02/12/19 0855  BP: (!) 162/73  (!) 166/75 (!) 170/79  Pulse: 82  82 77  Resp: 18  18   Temp: 98.5 F (36.9 C)  98.3 F (36.8 C) 98.2 F (36.8 C)  TempSrc: Oral  Oral Oral  SpO2: 96%  98% 97%  Weight:  86.7 kg    Height:        Intake/Output Summary (Last 24 hours) at 02/12/2019 1041 Last data filed at 02/12/2019 0900 Gross per 24 hour  Intake 1066.83 ml  Output 4150 ml  Net -3083.17 ml   Filed Weights   02/10/19 0419 02/11/19 0532 02/12/19 0325  Weight: 95 kg 90.4 kg 86.7 kg    Examination:  General exam: Appears calm and comfortable, 2 L nasal cannula Respiratory system: Bilateral crackles at the bases Cardiovascular system: S1 & S2 heard, RRR. No JVD, murmurs, rubs, gallops or clicks. No pedal edema. Gastrointestinal system: Abdomen is nondistended, soft and nontender. No organomegaly or masses felt. Normal bowel sounds heard. Central nervous system: Alert and oriented. No focal neurological deficits. Extremities: Symmetric 5 x 5 power. Skin: No rashes, lesions or ulcers Psychiatry: Judgement and insight appear normal. Mood & affect appropriate.     Data Reviewed:   CBC: Recent Labs  Lab 02/05/19 1805  02/06/19 1104 02/07/19 0342 02/08/19 0401 02/09/19 0435 02/10/19 0351 02/11/19 0318  WBC 12.4*  --  13.1* 10.0 11.1* 8.1 8.0 8.3  NEUTROABS 9.0*  --  10.7*  --   --   --   --   --   HGB 8.4*   < > 8.1* 7.5* 7.4* 7.0* 7.0* 9.2*  HCT 27.9*   < > 26.9* 25.1* 24.1*  23.1* 23.2* 23.3* 28.8*  MCV 99.3  --  97.8 97.3 96.8 97.5 97.1 93.2  PLT 342  --  307 293 273 276 290 267   < > = values in this interval not displayed.   Basic Metabolic Panel: Recent Labs  Lab 02/08/19 0401 02/09/19 0435 02/10/19 0351 02/11/19 0318 02/12/19 0805  NA 144 144 142 141 141  K 4.8 5.1 4.9 4.7 4.3  CL 121* 119* 116* 113* 107  CO2 16* 16* 17* 18* 21*  GLUCOSE 74 76 78 78 84  BUN 65* 73* 74* 77* 79*  CREATININE 3.42* 3.74* 3.88* 3.79* 4.01*  CALCIUM 6.9* 7.3*  7.4* 7.7* 7.8*  MG 1.4* 1.3* 1.3* 1.9 1.7   GFR: Estimated Creatinine Clearance: 14.4 mL/min (A) (by C-G formula based on SCr of 4.01 mg/dL (H)). Liver Function Tests: Recent Labs  Lab 02/05/19 1805  AST 26  ALT 20  ALKPHOS 166*  BILITOT 0.6  PROT 7.0  ALBUMIN 2.4*   No results for input(s): LIPASE, AMYLASE in the last 168 hours. No results for input(s): AMMONIA in the last 168 hours. Coagulation Profile: No results for input(s): INR, PROTIME in the last 168 hours. Cardiac Enzymes: No results for input(s): CKTOTAL, CKMB, CKMBINDEX, TROPONINI in the last 168 hours. BNP (last 3 results) No results for input(s): PROBNP in the last 8760 hours. HbA1C: No results for input(s): HGBA1C in the last 72 hours. CBG: No results for input(s): GLUCAP in the last 168 hours. Lipid Profile: Recent Labs    02/11/19 1430  CHOL 126  HDL 37*  LDLCALC 65  TRIG 118  CHOLHDL 3.4   Thyroid Function Tests: No results for input(s): TSH,  T4TOTAL, FREET4, T3FREE, THYROIDAB in the last 72 hours. Anemia Panel: No results for input(s): VITAMINB12, FOLATE, FERRITIN, TIBC, IRON, RETICCTPCT in the last 72 hours. Sepsis Labs: No results for input(s): PROCALCITON, LATICACIDVEN in the last 168 hours.  Recent Results (from the past 240 hour(s))  SARS CORONAVIRUS 2 (TAT 6-24 HRS) Nasopharyngeal Nasopharyngeal Swab     Status: None   Collection Time: 02/06/19  4:36 AM   Specimen: Nasopharyngeal Swab  Result Value Ref Range Status   SARS Coronavirus 2 NEGATIVE NEGATIVE Final    Comment: (NOTE) SARS-CoV-2 target nucleic acids are NOT DETECTED. The SARS-CoV-2 RNA is generally detectable in upper and lower respiratory specimens during the acute phase of infection. Negative results do not preclude SARS-CoV-2 infection, do not rule out co-infections with other pathogens, and should not be used as the sole basis for treatment or other patient management decisions. Negative results must be combined with  clinical observations, patient history, and epidemiological information. The expected result is Negative. Fact Sheet for Patients: SugarRoll.be Fact Sheet for Healthcare Providers: https://www.woods-mathews.com/ This test is not yet approved or cleared by the [place] FDA and  has been authorized for detection and/or diagnosis of SARS-CoV-2 by FDA under an Emergency Use Authorization (EUA). This EUA will remain  in effect (meaning this test can be used) for the duration of the COVID-19 declaration under Section 56 4(b)(1) of the Act, 21 U.S.C. section 360bbb-3(b)(1), unless the authorization is terminated or revoked sooner. Performed at [facility], [place] [address], [place], [place] [address]          Radiology Studies: No results found.      Scheduled Meds: . aspirin  81 mg Oral Daily  . [START ON 02/13/2019] aspirin  81 mg Oral Pre-Cath  . gabapentin  100 mg Oral BID  . heparin  5,000 Units Subcutaneous Q8H  . influenza vaccine adjuvanted  0.5 mL Intramuscular Tomorrow-1000  . lubriderm seriously sensitive   Topical TID  . mouth rinse  15 mL Mouth Rinse BID  . metolazone  10 mg Oral Daily  . metoprolol tartrate  50 mg Oral BID  . sodium bicarbonate  650 mg Oral TID  . sodium chloride flush  3 mL Intravenous Q12H  . sodium chloride flush  3 mL Intravenous Q12H   Continuous Infusions: . sodium chloride 250 mL (02/12/19 0537)  . sodium chloride Stopped (02/12/19 0011)  . furosemide 120 mg (02/12/19 0537)     LOS: 6 days   Time spent= 35 mins    Thornell Mule, MD [facility]  If 7PM-7AM, please contact night-coverage  02/12/2019, 10:41 AM

## 2019-02-12 NOTE — H&P (View-Only) (Signed)
Progress Note  Patient Name: Stephanie Griffith Date of Encounter: 02/12/2019  Primary Cardiologist: Minus Breeding, MD   Subjective   Breathing stable. She is impressed that her weight continues to drop. No chest pain, breathing comfortably on room air though still mildly short of breath when flat for a period of time. Reviewed plans for RHC tomorrow, she is amenable.  Inpatient Medications    Scheduled Meds: . amLODipine  10 mg Oral Daily  . aspirin  81 mg Oral Daily  . [START ON 02/13/2019] aspirin  81 mg Oral Pre-Cath  . atorvastatin  40 mg Oral q1800  . gabapentin  100 mg Oral BID  . heparin  5,000 Units Subcutaneous Q8H  . influenza vaccine adjuvanted  0.5 mL Intramuscular Tomorrow-1000  . lubriderm seriously sensitive   Topical TID  . mouth rinse  15 mL Mouth Rinse BID  . metolazone  10 mg Oral Daily  . metoprolol tartrate  50 mg Oral BID  . sodium bicarbonate  650 mg Oral TID  . sodium chloride flush  3 mL Intravenous Q12H  . sodium chloride flush  3 mL Intravenous Q12H   Continuous Infusions: . sodium chloride 250 mL (02/12/19 0537)  . sodium chloride Stopped (02/12/19 0011)  . furosemide 120 mg (02/12/19 0537)   PRN Meds: sodium chloride, sodium chloride, acetaminophen, hydrALAZINE, ondansetron (ZOFRAN) IV, polyethylene glycol, senna-docusate, sodium chloride flush, sodium chloride flush, zolpidem   Vital Signs    Vitals:   02/12/19 0325 02/12/19 0336 02/12/19 0855 02/12/19 1140  BP:  (!) 166/75 (!) 170/79 (!) 141/68  Pulse:  82 77 72  Resp:  18  18  Temp:  98.3 F (36.8 C) 98.2 F (36.8 C) 98 F (36.7 C)  TempSrc:  Oral Oral Oral  SpO2:  98% 97% 95%  Weight: 86.7 kg     Height:        Intake/Output Summary (Last 24 hours) at 02/12/2019 1217 Last data filed at 02/12/2019 1141 Gross per 24 hour  Intake 1066.83 ml  Output 4625 ml  Net -3558.17 ml   Last 3 Weights 02/12/2019 02/11/2019 02/10/2019  Weight (lbs) 191 lb 2.2 oz 199 lb 4.8 oz 209 lb 7 oz   Weight (kg) 86.7 kg 90.402 kg 95 kg      Telemetry    SR - Personally Reviewed  ECG    No new since 11/7- Personally Reviewed  Physical Exam   GEN: Well nourished, well developed in no acute distress HEENT: Normal, moist mucous membranes NECK: JVD just above clavicle at 45 degrees CARDIAC: regular rhythm, normal S1 and S2, no rubs or gallops. No murmurs. VASCULAR: Radial pulses 2+ bilaterally.  RESPIRATORY:  Lungs clearing, mildly coarse ABDOMEN: Soft, non-tender, non-distended MUSCULOSKELETAL:  S/p l aka SKIN: RLE with brawny edema but wrinkling/reduced girth today NEUROLOGIC:  Alert and oriented x 3. No focal neuro deficits noted. PSYCHIATRIC:  Normal affect   Labs    High Sensitivity Troponin:   Recent Labs  Lab 02/05/19 1805 02/06/19 0205 02/06/19 0541  TROPONINIHS 59* 59* 49*      Chemistry Recent Labs  Lab 02/05/19 1805  02/10/19 0351 02/11/19 0318 02/12/19 0805  NA 142   < > 142 141 141  K 5.4*   < > 4.9 4.7 4.3  CL 121*   < > 116* 113* 107  CO2 13*   < > 17* 18* 21*  GLUCOSE 103*   < > 78 78 84  BUN 57*   < >  74* 77* 79*  CREATININE 3.17*   < > 3.88* 3.79* 4.01*  CALCIUM 7.0*   < > 7.4* 7.7* 7.8*  PROT 7.0  --   --   --   --   ALBUMIN 2.4*  --   --   --   --   AST 26  --   --   --   --   ALT 20  --   --   --   --   ALKPHOS 166*  --   --   --   --   BILITOT 0.6  --   --   --   --   GFRNONAA 14*   < > 11* 11* 10*  GFRAA 16*   < > 13* 13* 12*  ANIONGAP 8   < > 9 10 13    < > = values in this interval not displayed.     Hematology Recent Labs  Lab 02/09/19 0435 02/10/19 0351 02/11/19 0318  WBC 8.1 8.0 8.3  RBC 2.38* 2.40* 3.09*  HGB 7.0* 7.0* 9.2*  HCT 23.2* 23.3* 28.8*  MCV 97.5 97.1 93.2  MCH 29.4 29.2 29.8  MCHC 30.2 30.0 31.9  RDW 16.8* 16.6* 16.1*  PLT 276 290 267    BNP Recent Labs  Lab 02/06/19 0313  BNP 1,007.7*     DDimer  Recent Labs  Lab 02/06/19 0205  DDIMER 2.90*     Radiology    No results found.   Cardiac Studies   Echo 02/06/19 1. Left ventricular ejection fraction, by visual estimation, is 60 to 65%. The left ventricle has normal function. There is moderately increased left ventricular hypertrophy. 2. Left ventricular diastolic parameters are indeterminate. 3. Right ventricular volume and pressure overload. 4. Global right ventricle has mildly reduced systolic function.The right ventricular size is moderately enlarged. 5. Right atrial size was severely dilated. 6. The mitral valve is normal in structure. No evidence of mitral valve regurgitation. 7. The tricuspid valve is normal in structure. Tricuspid valve regurgitation mild-moderate. 8. The aortic valve is tricuspid. Aortic valve regurgitation is mild. 9. Severely elevated pulmonary artery systolic pressure. 10. The tricuspid regurgitant velocity is 4.86 m/s, and with an assumed right atrial pressure of 15 mmHg, the estimated right ventricular systolic pressure is severely elevated at 109.5 mmHg. 11. The inferior vena cava is dilated in size with <50% respiratory variability, suggesting right atrial pressure of 15 mmHg.   Patient Profile     73 y.o. female with a hx of chronic diastolic heart failure, previously moderate (now severe) pulmonary HTN, CKD IV, anemia of chronic disease, HTN, remote paroxysmal atrial fibrillation, COPD, PAD (s/pfemoral to below knee popliteal bypassthen L AKA), now admitted with acute on chronic diastolic heart failure  Assessment & Plan    Acute on chronic diastolic HF: my concern is that this may be more right sided heart failure and pulmonary hypertension as opposed to left sided diastolic heart failure -please see my note from 11/9. Her E/e' and LA size do not support that this is diastolic heart failure alone. My concern is for [facility] as a primary cause. Her RV and TR on most recent echo are worse than 2017. -RHC scheduled with Dr. Haroldine Laws tomorrow AM -appreciate nephrology management  of diuresis given her acute kidney injury in the setting of chronic kidney disease. RHC will also help determining filling pressures/how much more diuresis she needs.  -net negative 7.4 L, wt today 86.7 kg (peak 100.7 kg) -Cr 4.01, slightly  up. On furosemide 120 mg q8 hours, metolazone 10 mg daily  Paroxysmal afib -isolated episode some years ago. Possible recurent episode this admission noted on tele, transient, some SVT -with anemia issues would not start anticoag at this time.  -currently SR -CHA2DS2/VAS Stroke Risk Points=5 -continue metoprolol 50 mg BID  HTN: more elevated today -continue diuresis -continue metoprolol -has hydralazine PRN.  -adding back amlodipine today. Unlikely to worsen her chronic venous stasis LE edema  PAD, hx of Lt AKA  -continue aspirin -not currently on statin, unclear reason why not.    -lipids added yesterday, LDL 65. Below goal of 70. With CKD, PAD, risk factors would like statin. Has been on atorvastatin 40 mg in the past, will restart.  For questions or updates, please contact [place] Please consult [email or web address] for contact info under     Signed, Buford Dresser, MD  02/12/2019, 12:17 PM

## 2019-02-13 ENCOUNTER — Encounter (HOSPITAL_COMMUNITY)
Admission: EM | Disposition: A | Discharge status: Home Health | Payer: Self-pay | Source: Home / Self Care | Attending: Internal Medicine

## 2019-02-13 ENCOUNTER — Encounter (HOSPITAL_COMMUNITY): Payer: Self-pay | Admitting: Internal Medicine

## 2019-02-13 DIAGNOSIS — I480 Paroxysmal atrial fibrillation: Secondary | ICD-10-CM

## 2019-02-13 DIAGNOSIS — I2721 Secondary pulmonary arterial hypertension: Secondary | ICD-10-CM

## 2019-02-13 HISTORY — PX: RIGHT HEART CATH: CATH118263

## 2019-02-13 LAB — CBC
HCT: 29.2 % — ABNORMAL LOW (ref 36.0–46.0)
HCT: 32.9 % — ABNORMAL LOW (ref 36.0–46.0)
Hemoglobin: 9.5 g/dL — ABNORMAL LOW (ref 12.0–15.0)
Hemoglobin: 10.4 g/dL — ABNORMAL LOW (ref 12.0–15.0)
MCH: 30.3 pg (ref 26.0–34.0)
MCH: 29.5 pg (ref 26.0–34.0)
MCHC: 32.5 g/dL (ref 30.0–36.0)
MCHC: 31.6 g/dL (ref 30.0–36.0)
MCV: 93 fL (ref 80.0–100.0)
MCV: 93.2 fL (ref 80.0–100.0)
Platelets: 389 10*3/uL (ref 150–400)
Platelets: 288 10*3/uL (ref 150–400)
RBC: 3.14 MIL/uL — ABNORMAL LOW (ref 3.87–5.11)
RBC: 3.53 MIL/uL — ABNORMAL LOW (ref 3.87–5.11)
RDW: 16 % — ABNORMAL HIGH (ref 11.5–15.5)
RDW: 15.9 % — ABNORMAL HIGH (ref 11.5–15.5)
WBC: 9.9 10*3/uL (ref 4.0–10.5)
WBC: 10.8 10*3/uL — ABNORMAL HIGH (ref 4.0–10.5)
nRBC: 0 % (ref 0.0–0.2)
nRBC: 0 % (ref 0.0–0.2)

## 2019-02-13 LAB — POCT I-STAT EG7
Acid-base deficit: 1 mmol/L (ref 0.0–2.0)
Acid-base deficit: 1 mmol/L (ref 0.0–2.0)
Bicarbonate: 23.2 mmol/L (ref 20.0–28.0)
Bicarbonate: 23.8 mmol/L (ref 20.0–28.0)
Bicarbonate: 22.7 mmol/L (ref 20.0–28.0)
Calcium, Ion: 1.04 mmol/L — ABNORMAL LOW (ref 1.15–1.40)
Calcium, Ion: 1.09 mmol/L — ABNORMAL LOW (ref 1.15–1.40)
Calcium, Ion: 1.07 mmol/L — ABNORMAL LOW (ref 1.15–1.40)
HCT: 29 % — ABNORMAL LOW (ref 36.0–46.0)
HCT: 30 % — ABNORMAL LOW (ref 36.0–46.0)
HCT: 30 % — ABNORMAL LOW (ref 36.0–46.0)
Hemoglobin: 10.2 g/dL — ABNORMAL LOW (ref 12.0–15.0)
Hemoglobin: 9.9 g/dL — ABNORMAL LOW (ref 12.0–15.0)
Hemoglobin: 10.2 g/dL — ABNORMAL LOW (ref 12.0–15.0)
O2 Saturation: 64 %
O2 Saturation: 70 %
O2 Saturation: 65 %
Potassium: 4.1 mmol/L (ref 3.5–5.1)
Potassium: 4.2 mmol/L (ref 3.5–5.1)
Potassium: 4.1 mmol/L (ref 3.5–5.1)
Sodium: 143 mmol/L (ref 135–145)
Sodium: 144 mmol/L (ref 135–145)
Sodium: 144 mmol/L (ref 135–145)
TCO2: 24 mmol/L (ref 22–32)
TCO2: 25 mmol/L (ref 22–32)
TCO2: 24 mmol/L (ref 22–32)
pCO2, Ven: 35 mmHg — ABNORMAL LOW (ref 44.0–60.0)
pCO2, Ven: 36.2 mmHg — ABNORMAL LOW (ref 44.0–60.0)
pCO2, Ven: 34.4 mmHg — ABNORMAL LOW (ref 44.0–60.0)
pH, Ven: 7.426 (ref 7.250–7.430)
pH, Ven: 7.43 (ref 7.250–7.430)
pH, Ven: 7.427 (ref 7.250–7.430)
pO2, Ven: 32 mmHg (ref 32.0–45.0)
pO2, Ven: 35 mmHg (ref 32.0–45.0)
pO2, Ven: 33 mmHg (ref 32.0–45.0)

## 2019-02-13 LAB — BASIC METABOLIC PANEL
Anion gap: 13 (ref 5–15)
BUN: 84 mg/dL — ABNORMAL HIGH (ref 8–23)
CO2: 22 mmol/L (ref 22–32)
Calcium: 7.9 mg/dL — ABNORMAL LOW (ref 8.9–10.3)
Chloride: 106 mmol/L (ref 98–111)
Creatinine, Ser: 4.1 mg/dL — ABNORMAL HIGH (ref 0.44–1.00)
GFR calc Af Amer: 12 mL/min — ABNORMAL LOW (ref >60)
GFR calc non Af Amer: 10 mL/min — ABNORMAL LOW (ref >60)
Glucose, Bld: 88 mg/dL (ref 70–99)
Potassium: 4.1 mmol/L (ref 3.5–5.1)
Sodium: 141 mmol/L (ref 135–145)

## 2019-02-13 LAB — CREATININE, SERUM
Creatinine, Ser: 4.24 mg/dL — ABNORMAL HIGH (ref 0.44–1.00)
GFR calc Af Amer: 11 mL/min — ABNORMAL LOW (ref >60)
GFR calc non Af Amer: 10 mL/min — ABNORMAL LOW (ref >60)

## 2019-02-13 LAB — SARS CORONAVIRUS 2 (TAT 6-24 HRS): SARS Coronavirus 2: NEGATIVE

## 2019-02-13 LAB — MAGNESIUM: Magnesium: 1.6 mg/dL — ABNORMAL LOW (ref 1.7–2.4)

## 2019-02-13 LAB — POCT ACTIVATED CLOTTING TIME: Activated Clotting Time: 131 seconds

## 2019-02-13 SURGERY — RIGHT HEART CATH
Anesthesia: LOCAL

## 2019-02-13 MED ORDER — LIDOCAINE HCL (PF) 1 % IJ SOLN
INTRAMUSCULAR | Status: AC
  Filled 2019-02-13: qty 30

## 2019-02-13 MED ORDER — ENOXAPARIN SODIUM 40 MG/0.4ML ~~LOC~~ SOLN: 40.0000 mg | SUBCUTANEOUS | Status: DC

## 2019-02-13 MED ORDER — FENTANYL CITRATE (PF) 100 MCG/2ML IJ SOLN
INTRAMUSCULAR | Status: DC | PRN
  Administered 2019-02-13: 25 ug via INTRAVENOUS

## 2019-02-13 MED ORDER — FENTANYL CITRATE (PF) 100 MCG/2ML IJ SOLN
INTRAMUSCULAR | Status: AC
  Filled 2019-02-13: qty 2

## 2019-02-13 MED ORDER — ACETAMINOPHEN 325 MG PO TABS
650.0000 mg | ORAL_TABLET | ORAL | Status: DC | PRN
  Administered 2019-02-13 – 2019-02-16 (×3): 650 mg via ORAL
  Filled 2019-02-13 (×3): qty 2

## 2019-02-13 MED ORDER — LABETALOL HCL 5 MG/ML IV SOLN: 10.0000 mg | INTRAVENOUS | Status: AC | PRN

## 2019-02-13 MED ORDER — SODIUM CHLORIDE 0.9 % IV SOLN
INTRAVENOUS | Status: DC
  Administered 2019-02-13: 06:00:00 via INTRAVENOUS

## 2019-02-13 MED ORDER — SODIUM CHLORIDE 0.9% FLUSH: 3.0000 mL | INTRAVENOUS | Status: DC | PRN

## 2019-02-13 MED ORDER — LIDOCAINE HCL (PF) 1 % IJ SOLN
INTRAMUSCULAR | Status: DC | PRN
  Administered 2019-02-13: 2 mL

## 2019-02-13 MED ORDER — ENOXAPARIN SODIUM 30 MG/0.3ML ~~LOC~~ SOLN
30.0000 mg | SUBCUTANEOUS | Status: DC
  Administered 2019-02-14 – 2019-02-18 (×4): 30 mg via SUBCUTANEOUS
  Filled 2019-02-13 (×4): qty 0.3

## 2019-02-13 MED ORDER — SODIUM CHLORIDE 0.9 % IV SOLN: 250.0000 mL | INTRAVENOUS | Status: DC | PRN

## 2019-02-13 MED ORDER — HEPARIN (PORCINE) IN NACL 1000-0.9 UT/500ML-% IV SOLN
INTRAVENOUS | Status: AC
  Filled 2019-02-13: qty 500

## 2019-02-13 MED ORDER — HYDRALAZINE HCL 20 MG/ML IJ SOLN: 10.0000 mg | INTRAMUSCULAR | Status: AC | PRN

## 2019-02-13 MED ORDER — HEPARIN (PORCINE) IN NACL 1000-0.9 UT/500ML-% IV SOLN
INTRAVENOUS | Status: DC | PRN
  Administered 2019-02-13: 500 mL

## 2019-02-13 MED ORDER — SODIUM CHLORIDE 0.9% FLUSH
3.0000 mL | Freq: Two times a day (BID) | INTRAVENOUS | Status: DC
  Administered 2019-02-13 – 2019-02-17 (×10): 3 mL via INTRAVENOUS

## 2019-02-13 MED ORDER — ONDANSETRON HCL 4 MG/2ML IJ SOLN: 4.0000 mg | Freq: Four times a day (QID) | INTRAMUSCULAR | Status: DC | PRN

## 2019-02-13 MED ORDER — SILDENAFIL CITRATE 20 MG PO TABS
20.0000 mg | ORAL_TABLET | Freq: Three times a day (TID) | ORAL | Status: DC
  Administered 2019-02-13 – 2019-02-18 (×16): 20 mg via ORAL
  Filled 2019-02-13 (×16): qty 1

## 2019-02-13 SURGICAL SUPPLY — 6 items
CATH BALLN WEDGE 5F 110CM (CATHETERS) ×1 IMPLANT
PACK CARDIAC CATHETERIZATION (CUSTOM PROCEDURE TRAY) ×2 IMPLANT
SHEATH GLIDE SLENDER 4/5FR (SHEATH) ×1 IMPLANT
TRANSDUCER W/STOPCOCK (MISCELLANEOUS) ×2 IMPLANT
TUBING ART PRESS 72  MALE/FEM (TUBING) ×1
TUBING ART PRESS 72 MALE/FEM (TUBING) IMPLANT

## 2019-02-13 NOTE — Progress Notes (Signed)
Progress Note  Patient Name: Stephanie Griffith Date of Encounter: 02/13/2019  Primary Cardiologist: Minus Breeding, MD   Subjective   Seen after right heart cath. Procedure results reviewed.    Inpatient Medications    Scheduled Meds: . amLODipine  10 mg Oral Daily  . aspirin  81 mg Oral Daily  . atorvastatin  40 mg Oral q1800  . [START ON 02/14/2019] enoxaparin (LOVENOX) injection  40 mg Subcutaneous Q24H  . gabapentin  100 mg Oral BID  . influenza vaccine adjuvanted  0.5 mL Intramuscular Tomorrow-1000  . lubriderm seriously sensitive   Topical TID  . mouth rinse  15 mL Mouth Rinse BID  . metoprolol tartrate  50 mg Oral BID  . sildenafil  20 mg Oral TID  . sodium bicarbonate  650 mg Oral TID  . sodium chloride flush  3 mL Intravenous Q12H  . sodium chloride flush  3 mL Intravenous Q12H   Continuous Infusions: . sodium chloride Stopped (02/12/19 2150)  . sodium chloride 10 mL/hr at 02/13/19 0614  . sodium chloride     PRN Meds: sodium chloride, sodium chloride, acetaminophen, hydrALAZINE, hydrALAZINE, labetalol, ondansetron (ZOFRAN) IV, polyethylene glycol, senna-docusate, sodium chloride flush, sodium chloride flush, zolpidem   Vital Signs    Vitals:   02/13/19 0911 02/13/19 0917 02/13/19 0932 02/13/19 0945  BP: (!) 176/72 (!) 153/73 (!) 178/68 129/64  Pulse: 78 (!) 53 80   Resp: 18 18    Temp: 98.6 F (37 C) 98.6 F (37 C)    TempSrc: Oral Oral    SpO2: 97% (!) 52% (!) 70%   Weight:      Height:        Intake/Output Summary (Last 24 hours) at 02/13/2019 1035 Last data filed at 02/13/2019 0900 Gross per 24 hour  Intake 1055.4 ml  Output 2825 ml  Net -1769.6 ml   Last 3 Weights 02/13/2019 02/12/2019 02/11/2019  Weight (lbs) 185 lb 3 oz 191 lb 2.2 oz 199 lb 4.8 oz  Weight (kg) 84 kg 86.7 kg 90.402 kg      Telemetry    SR - Personally Reviewed  ECG    No new since 11/7- Personally Reviewed  Physical Exam   GEN: Well nourished, well developed in  no acute distress HEENT: Normal, moist mucous membranes NECK: JVD just above clavicle at 45 degrees CARDIAC: regular rhythm, normal S1 and S2, no rubs or gallops. No murmurs. VASCULAR: Radial pulses 2+ bilaterally.  RESPIRATORY:  Lungs clearing, mildly coarse ABDOMEN: Soft, non-tender, non-distended MUSCULOSKELETAL:  S/p l aka SKIN: RLE with brawny edema but wrinkling/reduced girth today NEUROLOGIC:  Alert and oriented x 3. No focal neuro deficits noted. PSYCHIATRIC:  Normal affect   Labs    High Sensitivity Troponin:   Recent Labs  Lab 02/05/19 1805 02/06/19 0205 02/06/19 0541  TROPONINIHS 59* 59* 49*      Chemistry Recent Labs  Lab 02/11/19 0318 02/12/19 0805 02/13/19 0427  NA 141 141 141  K 4.7 4.3 4.1  CL 113* 107 106  CO2 18* 21* 22  GLUCOSE 78 84 88  BUN 77* 79* 84*  CREATININE 3.79* 4.01* 4.10*  CALCIUM 7.7* 7.8* 7.9*  GFRNONAA 11* 10* 10*  GFRAA 13* 12* 12*  ANIONGAP 10 13 13      Hematology Recent Labs  Lab 02/10/19 0351 02/11/19 0318 02/13/19 0527  WBC 8.0 8.3 9.9  RBC 2.40* 3.09* 3.14*  HGB 7.0* 9.2* 9.5*  HCT 23.3* 28.8* 29.2*  MCV 97.1  93.2 93.0  MCH 29.2 29.8 30.3  MCHC 30.0 31.9 32.5  RDW 16.6* 16.1* 16.0*  PLT 290 267 389    BNP No results for input(s): BNP, PROBNP in the last 168 hours.   DDimer  No results for input(s): DDIMER in the last 168 hours.   Radiology    No results found.  Cardiac Studies   Echo 02/06/19 1. Left ventricular ejection fraction, by visual estimation, is 60 to 65%. The left ventricle has normal function. There is moderately increased left ventricular hypertrophy. 2. Left ventricular diastolic parameters are indeterminate. 3. Right ventricular volume and pressure overload. 4. Global right ventricle has mildly reduced systolic function.The right ventricular size is moderately enlarged. 5. Right atrial size was severely dilated. 6. The mitral valve is normal in structure. No evidence of mitral valve  regurgitation. 7. The tricuspid valve is normal in structure. Tricuspid valve regurgitation mild-moderate. 8. The aortic valve is tricuspid. Aortic valve regurgitation is mild. 9. Severely elevated pulmonary artery systolic pressure. 10. The tricuspid regurgitant velocity is 4.86 m/s, and with an assumed right atrial pressure of 15 mmHg, the estimated right ventricular systolic pressure is severely elevated at 109.5 mmHg. 11. The inferior vena cava is dilated in size with <50% respiratory variability, suggesting right atrial pressure of 15 mmHg.   Patient Profile     73 y.o. female with a hx of chronic diastolic heart failure, previously moderate (now severe) pulmonary HTN by echo, CKD IV, anemia of chronic disease, HTN, remote paroxysmal atrial fibrillation, COPD, PAD (s/pfemoral to below knee popliteal bypassthen L AKA), now admitted with acute on chronic diastolic heart failure  Assessment & Plan    Moderate to severe pulmonary hypertension: RHC today. Dry based on RA pressures and wedge of 3. However, significantly elevated pulmonary pressures despite low filling pressures. Moderate PH, with PA of 83/22, mean 42. Echo this admission with RVSP 109.5 mmHg prior to diuresis. -see my prior notes. This agrees with my assessment of her echo, given lack of E/e' elevated and normal left atrial size, with RV appearing worse than LV. 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 May need full workup as outpatient. -started on sildenafil -will need to follow up in [facility] clinic to discuss initiation of combination therapy.  -stopped diuresis given her filling pressures -suspect she will need outpatient diuretic regimen per nephrology, but no longer needs IV diuresis based on RHC numbers. Would recommend not restarting diuretics today but could consider  maintenance restart of oral medication in near future, defer to nephrology team.  Acute on chronic diastolic HF: findings of RHC support this is PH more than diastolic heart failure given elevated PA pressures with normal wedge.  Paroxysmal afib -isolated episode some years ago. Possible recurrent episode this admission noted on tele, transient, some SVT -with anemia issues would not start anticoag at this time.  -However, with possible PAH, concern for increased clotting risk. Hgb still low but improved with diuresis. Will need to weigh risk/balance of anticoagulation if her Hgb remains stable on outpatient follow up. With PAH and renal disease, the most data is for warfarin, which will require coumadin clinic and INR monitoring. With a history of paroxysmal afib and chadsvasc=5, she is high risk for stroke -currently SR -continue metoprolol 50 mg BID  HTN: improved with re-addition of amlodipine -continue metoprolol, amlodipine -started  on sildenafil today -has hydralazine PRN.   PAD, hx of Lt AKA  -continue aspirin -restarted atorvastatin 40 mg this admission  For questions or updates, please contact [place] Please consult [email or web address] for contact info under     Signed, Buford Dresser, MD  02/13/2019, 10:35 AM

## 2019-02-13 NOTE — Progress Notes (Signed)
[facility] Progress Note  Subjective:   RHCtoday confirmed significant pulmonary hypertension, starting on sildenafil  IV diuretic stopped by cardiology, based upon [place] numbers  Patient on room air, no complaints  Serum creatinine slightly worsened to 4.1, K4.1, bicarbonate 22  2.7 L urine output yesterday, -9 L since admission  Edema markedly improved  Vitals:   02/13/19 1051 02/13/19 1102 02/13/19 1132 02/13/19 1202  BP: 133/61 (!) 128/57 (!) 111/53 (!) 165/106  Pulse: 67 68 68 66  Resp: 19 19    Temp: 97.9 F (36.6 C) 97.9 F (36.6 C)    TempSrc: Oral Oral    SpO2: 96% 100% 99% (!) 57%  Weight:      Height:        Inpatient medications: . amLODipine  10 mg Oral Daily  . aspirin  81 mg Oral Daily  . atorvastatin  40 mg Oral q1800  . [START ON 02/14/2019] enoxaparin (LOVENOX) injection  30 mg Subcutaneous Q24H  . gabapentin  100 mg Oral BID  . influenza vaccine adjuvanted  0.5 mL Intramuscular Tomorrow-1000  . lubriderm seriously sensitive   Topical TID  . mouth rinse  15 mL Mouth Rinse BID  . metoprolol tartrate  50 mg Oral BID  . sildenafil  20 mg Oral TID  . sodium bicarbonate  650 mg Oral TID  . sodium chloride flush  3 mL Intravenous Q12H  . sodium chloride flush  3 mL Intravenous Q12H   . sodium chloride Stopped (02/12/19 2150)  . sodium chloride 10 mL/hr at 02/13/19 0614  . sodium chloride     sodium chloride, sodium chloride, acetaminophen, hydrALAZINE, hydrALAZINE, labetalol, ondansetron (ZOFRAN) IV, polyethylene glycol, senna-docusate, sodium chloride flush, sodium chloride flush, zolpidem    Exam: Gen  NAD, pleasant, on nasal cannula +JVD Chest mostly clear, occ rales , no wheezing, no ^wob RRR no MRG Soft, nontender GU purewick draining pale yellow urine Left AKA, right lower extremity with scaling and hyperpigmentation consistent with chronic venous stasis Neuro is alert, Ox 3 , nf    Home meds:  - amlodipine 10/  metoprolol 50 bid  - gabapentin 100 bid  - aspirin 81      Date             Creat               eGFR  2009- 10         1.93- 3.11  2013               1.46- 2.97          2017               1.66- 2.79        16- 30  Feb 2019        2.06                 23  02/05/2019       3.17                 16, stage IV  02/07/2019       3.36                 15, stage IV-V    Na 145 K 5.0  CO2 13  BUN 64  Cr 3.36  Mg 1.0 Ca 6.9  Alb 2.4   WBC 10k  Hb 7.5  plt 293   CXR 11/5 > Cardiomegaly and pulmonary  vascular congestion without overt edema  ECHO - LVEF 60%, RV pressure/ vol overload, RV slightly reduced fxn. RA severely dilated, severe ^RV sys 185m Hg. IVC dilated w/ low resp variability suggesting RA pressure 120mHg.   Assessment/ Plan: 1. Renal failure - since 2013 - Feb 2019 patient has had mostly CKD IV disease w/ creat 1.6- 2.5.  Now creat 3's.  Favor progressive CKD.  No uremic symptoms.    After RHC diuretics stopped on 11/13, will need to resume oral diuretics once GFR stabilized 2. Pulmonary hypertension, RHC 11/13, starting sildenafil, per cardiology 3. HTN - cont meds BP's reasonably well controlled 4. Met acidosis - po bicarb started, stable at 21 5. SP L AKA 6. Obesity 7. COPD 8. Anemia, transfuse as indicated 9. Hypomagnesemia, Diuretic/diet related, replete aggressively  RyRexene Agent11/13/2020, 12:24 PM  Iron/TIBC/Ferritin/ %Sat    Component Value Date/Time   IRON 27 (L) 02/08/2019 0401   TIBC 165 (L) 02/08/2019 0401   FERRITIN 73 02/08/2019 0401   IRONPCTSAT 16 02/08/2019 0401   Recent Labs  Lab 02/13/19 0427 02/13/19 1047  NA 141  --   K 4.1  --   CL 106  --   CO2 22  --   GLUCOSE 88  --   BUN 84*  --   CREATININE 4.10* 4.24*  CALCIUM 7.9*  --    No results for input(s): AST, ALT, ALKPHOS, BILITOT, PROT in the last 168 hours. Recent Labs  Lab 02/13/19 1047  WBC 10.8*  HGB 10.4*  HCT 32.9*  PLT 288

## 2019-02-13 NOTE — Interval H&P Note (Signed)
History and Physical Interval Note:  02/13/2019 7:48 AM  Stephanie Griffith  has presented today for surgery, with the diagnosis of Pulmonary hypertention.  The various methods of treatment have been discussed with the patient and family. After consideration of risks, benefits and other options for treatment, the patient has consented to  Procedure(s): RIGHT HEART CATH (N/A) as a surgical intervention.  The patient's history has been reviewed, patient examined, no change in status, stable for surgery.  I have reviewed the patient's chart and labs.  Questions were answered to the patient's satisfaction.     Stephanie Griffith

## 2019-02-13 NOTE — Plan of Care (Signed)
  Problem: Education: Goal: Ability to demonstrate management of disease process will improve Outcome: Progressing   Problem: Safety: Goal: Ability to remain free from injury will improve Outcome: Progressing

## 2019-02-13 NOTE — Plan of Care (Signed)
  Problem: Education: Goal: Ability to demonstrate management of disease process will improve Outcome: Progressing Goal: Ability to verbalize understanding of medication therapies will improve Outcome: Progressing Goal: Individualized Educational Video(s) Outcome: Progressing   Problem: Activity: Goal: Capacity to carry out activities will improve Description: With left AKA and weakness homebound with 7 daughters that help her Outcome: Progressing Note: Bed wheel chair bound   Problem: Cardiac: Goal: Ability to achieve and maintain adequate cardiopulmonary perfusion will improve Outcome: Progressing   Problem: Health Behavior/Discharge Planning: Goal: Ability to manage health-related needs will improve Outcome: Progressing   Problem: Clinical Measurements: Goal: Ability to maintain clinical measurements within normal limits will improve Outcome: Progressing Goal: Will remain free from infection Outcome: Progressing Goal: Diagnostic test results will improve Outcome: Progressing Goal: Cardiovascular complication will be avoided Outcome: Progressing   Problem: Activity: Goal: Risk for activity intolerance will decrease Outcome: Progressing   Problem: Nutrition: Goal: Adequate nutrition will be maintained Outcome: Progressing   Problem: Coping: Goal: Level of anxiety will decrease Outcome: Progressing   Problem: Elimination: Goal: Will not experience complications related to bowel motility Outcome: Progressing Goal: Will not experience complications related to urinary retention Outcome: Progressing   Problem: Pain Managment: Goal: General experience of comfort will improve Outcome: Progressing   Problem: Skin Integrity: Goal: Risk for impaired skin integrity will decrease Outcome: Progressing   Problem: Education: Goal: Understanding of CV disease, CV risk reduction, and recovery process will improve Outcome: Progressing   Problem: Activity: Goal: Ability  to return to baseline activity level will improve Outcome: Progressing   Problem: Cardiovascular: Goal: Ability to achieve and maintain adequate cardiovascular perfusion will improve Outcome: Progressing Goal: Vascular access site(s) Level 0-1 will be maintained Outcome: Progressing   Problem: Health Behavior/Discharge Planning: Goal: Ability to safely manage health-related needs after discharge will improve Outcome: Progressing   Problem: Education: Goal: Knowledge of General Education information will improve Description: Including pain rating scale, medication(s)/side effects and non-pharmacologic comfort measures Outcome: Progressing   Problem: Health Behavior/Discharge Planning: Goal: Ability to manage health-related needs will improve Outcome: Progressing   Problem: Clinical Measurements: Goal: Ability to maintain clinical measurements within normal limits will improve Outcome: Progressing Goal: Will remain free from infection Outcome: Progressing Goal: Diagnostic test results will improve Outcome: Progressing Goal: Respiratory complications will improve Outcome: Progressing Goal: Cardiovascular complication will be avoided Outcome: Progressing   Problem: Activity: Goal: Risk for activity intolerance will decrease Outcome: Progressing   Problem: Nutrition: Goal: Adequate nutrition will be maintained Outcome: Progressing   Problem: Coping: Goal: Level of anxiety will decrease Outcome: Progressing   Problem: Elimination: Goal: Will not experience complications related to bowel motility Outcome: Progressing Goal: Will not experience complications related to urinary retention Outcome: Progressing   Problem: Pain Managment: Goal: General experience of comfort will improve Outcome: Progressing   Problem: Safety: Goal: Ability to remain free from injury will improve Outcome: Progressing   Problem: Skin Integrity: Goal: Risk for impaired skin integrity will  decrease Outcome: Progressing

## 2019-02-13 NOTE — Progress Notes (Signed)
PROGRESS NOTE    Stephanie Griffith  [id number] DOB: 1945/07/05 DOA: 02/05/2019 PCP: [name], MD    Brief Narrative:  73 y.o.femalewith medical history significant forchronic kidney disease stage IV, peripheral vascular disease status post left AKA, hypertension, chronic anemia, and chronic diastolic CHF, presented to the emergency department with 1 week of progressive leg swelling and shortness of breath.  Found to be in CHF exacerbation complicated by CKD. She was diuresed , renal functions exacerbated. 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 Right heart cath with normal ejection fraction, shows mild pulmonary hypertension. Intake and output monitoring.  Monitor electrolytes. Followed by nephrology, today of diuretics. Advance activities.  Pulmonary hypertension: Diagnosed with right heart cath today.  Started on sildenafil.  Will need outpatient follow-up and monitoring.  Acute kidney injury on chronic kidney disease stage IV: Baseline creatinine reported to.  Admission creatinine was 3.1.  Trended up to 4.  Renal ultrasound with chronic kidney disease.  Followed by nephrology.  Currently no evidence of uremia.  Potassium is normal.  Hypertension: Blood pressure stable.  On amiodarone, hydralazine and beta-blockers.  Anemia of chronic disease: Baseline hemoglobin 8.  Received 1 unit of PRBC on 11/10.  Hemoglobin is fairly stable.   DVT prophylaxis: Lovenox subcu Code Status: Full code Family Communication: None Disposition Plan: Anticipate home when  medically stable.  Next 24 to 48 hours. Advance activities.  Wean off oxygen.  She does use wheelchair most of the time, she has left leg prosthesis however has not used it for long time because it would not fit.   Consultants:   Cardiology  Nephrology  Procedures:   Cardiac cath, right heart cath 11/13  Antimicrobials:   None   Subjective: Patient seen and examined.  Came back from procedure.  No complaints.  Denies any shortness of breath chest pain.  She thinks he lost a lot of weight. Chart shows with weight 220.7 lbs -185.2 today. 9 L negative balance since admission.  Objective: Vitals:   02/13/19 0911 02/13/19 0917 02/13/19 0932 02/13/19 0945  BP: (!) 176/72 (!) 153/73 (!) 178/68 129/64  Pulse: 78 (!) 53 80   Resp: 18 18    Temp: 98.6 F (37 C) 98.6 F (37 C)    TempSrc: Oral Oral    SpO2: 97% (!) 52% (!) 70%   Weight:      Height:        Intake/Output Summary (Last 24 hours) at 02/13/2019 0957 Last data filed at 02/13/2019 0900 Gross per 24 hour  Intake 1055.4 ml  Output 2825 ml  Net -1769.6 ml   Filed Weights   02/11/19 0532 02/12/19 0325 02/13/19 0531  Weight: 90.4 kg 86.7 kg 84 kg    Examination:  General exam: Appears calm and comfortable , she is currently comfortable on room air.  Saturating adequate. Respiratory system: Clear to auscultation. Respiratory effort normal.  Occasional fine crackles at posterior bases. Cardiovascular system: S1 & S2 heard, RRR. No JVD, murmurs, rubs, gallops or clicks.  Gastrointestinal system: Abdomen is nondistended, soft and nontender. No organomegaly or masses felt.  Normal bowel sounds heard. Central nervous system: Alert and oriented. No focal neurological deficits. Extremities: Symmetric 5 x 5 power. Skin: No rashes, lesions or ulcers Psychiatry: Judgement and insight appear normal. Mood & affect appropriate.   Right leg: She has chronic, thick scaly and discolored skin.  Has unstagable ulcer right heel on  barrier dressing. Left leg AKA stump , clean and dry. No edema.     Data Reviewed: I have personally reviewed following labs and imaging studies  CBC: Recent Labs  Lab 02/06/19 1104  02/08/19 0401 02/09/19 0435 02/10/19 0351 02/11/19 0318 02/13/19 0527  WBC 13.1*   < > 11.1* 8.1 8.0 8.3 9.9  NEUTROABS 10.7*  --   --   --   --   --   --   HGB 8.1*   < > 7.4* 7.0* 7.0* 9.2* 9.5*  HCT 26.9*   < > 24.1*  23.1* 23.2* 23.3* 28.8* 29.2*  MCV 97.8   < > 96.8 97.5 97.1 93.2 93.0  PLT 307   < > 273 276 290 267 389   < > = values in this interval not displayed.   Basic Metabolic Panel: Recent Labs  Lab 02/09/19 0435 02/10/19 0351 02/11/19 0318 02/12/19 0805 02/13/19 0427  NA 144 142 141 141 141  K 5.1 4.9 4.7 4.3 4.1  CL 119* 116* 113* 107 106  CO2 16* 17* 18* 21* 22  GLUCOSE 76 78 78 84 88  BUN 73* 74* 77* 79* 84*  CREATININE 3.74* 3.88* 3.79* 4.01* 4.10*  CALCIUM 7.3* 7.4* 7.7* 7.8* 7.9*  MG 1.3* 1.3* 1.9 1.7 1.6*   GFR: Estimated Creatinine Clearance: 13.9 mL/min (A) (by C-G formula based on SCr of 4.1 mg/dL (H)). Liver Function Tests: No results for input(s): AST, ALT, ALKPHOS, BILITOT, PROT, ALBUMIN in the last 168 hours. No results for input(s): LIPASE, AMYLASE in the last 168 hours. No results for input(s): AMMONIA in the last 168 hours. Coagulation Profile: No results for input(s): INR, PROTIME in the last 168 hours. Cardiac Enzymes: No results for input(s): CKTOTAL, CKMB, CKMBINDEX, TROPONINI in the last 168 hours. BNP (last 3 results) No results for input(s): PROBNP in the last 8760 hours. HbA1C: No results for input(s): HGBA1C in the last 72 hours. CBG: No results for input(s): GLUCAP in the last 168 hours. Lipid Profile: Recent Labs    02/11/19 1430  CHOL 126  HDL 37*  LDLCALC 65  TRIG 118  CHOLHDL 3.4   Thyroid Function Tests: No results for input(s): TSH, T4TOTAL, FREET4, T3FREE, THYROIDAB in the last 72 hours. Anemia Panel: No results for  input(s): VITAMINB12, FOLATE, FERRITIN, TIBC, IRON, RETICCTPCT in the last 72 hours. Sepsis Labs: No results for input(s): PROCALCITON, LATICACIDVEN in the last 168 hours.  Recent Results (from the past 240 hour(s))  SARS CORONAVIRUS 2 (TAT 6-24 HRS) Nasopharyngeal Nasopharyngeal Swab     Status: None   Collection Time: 02/06/19  4:36 AM   Specimen: Nasopharyngeal Swab  Result Value Ref Range Status   SARS Coronavirus 2 NEGATIVE NEGATIVE Final    Comment: (NOTE) SARS-CoV-2 target nucleic acids are NOT DETECTED. The SARS-CoV-2 RNA is generally detectable in upper and lower respiratory specimens during the acute phase of infection. Negative results do not preclude SARS-CoV-2 infection, do not rule out co-infections with other pathogens, and should not be used as the sole basis for treatment or other patient management decisions. Negative results must be combined with clinical observations, patient history, and epidemiological information. The  expected result is Negative. Fact Sheet for Patients: SugarRoll.be Fact Sheet for Healthcare Providers: https://www.woods-mathews.com/ This test is not yet approved or cleared by the [place] FDA and  has been authorized for detection and/or diagnosis of SARS-CoV-2 by FDA under an Emergency Use Authorization (EUA). This EUA will remain  in effect (meaning this test can be used) for the duration of the COVID-19 declaration under Section 56 4(b)(1) of the Act, 21 U.S.C. section 360bbb-3(b)(1), unless the authorization is terminated or revoked sooner. Performed at [facility], [place] [address], [place], [place] [address]   SARS CORONAVIRUS 2 (TAT 6-24 HRS) Nasopharyngeal Nasopharyngeal Swab     Status: None   Collection Time: 02/12/19  9:55 PM   Specimen: Nasopharyngeal Swab  Result Value Ref Range Status   SARS Coronavirus 2 NEGATIVE NEGATIVE Final    Comment: (NOTE) SARS-CoV-2 target nucleic  acids are NOT DETECTED. The SARS-CoV-2 RNA is generally detectable in upper and lower respiratory specimens during the acute phase of infection. Negative results do not preclude SARS-CoV-2 infection, do not rule out co-infections with other pathogens, and should not be used as the sole basis for treatment or other patient management decisions. Negative results must be combined with clinical observations, patient history, and epidemiological information. The expected result is Negative. Fact Sheet for Patients: SugarRoll.be Fact Sheet for Healthcare Providers: https://www.woods-mathews.com/ This test is not yet approved or cleared by the [place] FDA and  has been authorized for detection and/or diagnosis of SARS-CoV-2 by FDA under an Emergency Use Authorization (EUA). This EUA will remain  in effect (meaning this test can be used) for the duration of the COVID-19 declaration under Section 56 4(b)(1) of the Act, 21 U.S.C. section 360bbb-3(b)(1), unless the authorization is terminated or revoked sooner. Performed at [facility], [place] [address], [place],  [address]          Radiology Studies: No results found.      Scheduled Meds: . amLODipine  10 mg Oral Daily  . aspirin  81 mg Oral Daily  . atorvastatin  40 mg Oral q1800  . [START ON 02/14/2019] enoxaparin (LOVENOX) injection  40 mg Subcutaneous Q24H  . gabapentin  100 mg Oral BID  . influenza vaccine adjuvanted  0.5 mL Intramuscular Tomorrow-1000  . lubriderm seriously sensitive   Topical TID  . mouth rinse  15 mL Mouth Rinse BID  . metoprolol tartrate  50 mg Oral BID  . sildenafil  20 mg Oral TID  . sodium bicarbonate  650 mg Oral TID  . sodium chloride flush  3 mL Intravenous Q12H  . sodium chloride flush  3 mL Intravenous Q12H   Continuous Infusions: . sodium chloride Stopped (02/12/19 2150)  . sodium chloride 10 mL/hr at 02/13/19 0614  . sodium  chloride       LOS: 7 days    Time spent: 30 minutes     Barb Merino, MD [facility] Pager [phone]

## 2019-02-14 LAB — BASIC METABOLIC PANEL
Anion gap: 15 (ref 5–15)
BUN: 91 mg/dL — ABNORMAL HIGH (ref 8–23)
CO2: 23 mmol/L (ref 22–32)
Calcium: 7.9 mg/dL — ABNORMAL LOW (ref 8.9–10.3)
Chloride: 103 mmol/L (ref 98–111)
Creatinine, Ser: 4.47 mg/dL — ABNORMAL HIGH (ref 0.44–1.00)
GFR calc Af Amer: 11 mL/min — ABNORMAL LOW (ref >60)
GFR calc non Af Amer: 9 mL/min — ABNORMAL LOW (ref >60)
Glucose, Bld: 105 mg/dL — ABNORMAL HIGH (ref 70–99)
Potassium: 3.7 mmol/L (ref 3.5–5.1)
Sodium: 141 mmol/L (ref 135–145)

## 2019-02-14 LAB — MAGNESIUM: Magnesium: 1.5 mg/dL — ABNORMAL LOW (ref 1.7–2.4)

## 2019-02-14 NOTE — Progress Notes (Signed)
PROGRESS NOTE    Stephanie REPA  [id number] DOB: [birth year] DOA: 02/05/2019 PCP: [name], MD    Brief Narrative:  73 y.o.femalewith medical history significant forchronic kidney disease stage IV, peripheral vascular disease status post left AKA, hypertension, chronic anemia, and chronic diastolic CHF, presented to the emergency department with 1 week of progressive leg swelling and shortness of breath.  Found to be in CHF exacerbation complicated by CKD. She was diuresed , renal functions exacerbated. 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 Right heart cath with normal ejection fraction, shows moderate to severe pulmonary hypertension. Intake and output monitoring.  Monitor electrolytes. Followed by nephrology, today off diuretics. Advance activities. Creatinine still elevated, followed by nephrology.  Recheck tomorrow morning.  Moderate to severe pulmonary hypertension: Diagnosed with right heart cath. Started on sildenafil.  Will need outpatient follow-up and monitoring.  Acute kidney injury on chronic kidney disease stage IV: Baseline creatinine reported to.  Admission creatinine was 3.1.  Trending up.  Renal ultrasound with chronic kidney disease.  Followed by nephrology.  Currently no evidence of uremia.  Potassium is normal.  Hypertension: Blood pressure stable.  On amiodarone, hydralazine and beta-blockers.  Anemia of chronic disease: Baseline hemoglobin 8.  Received 1 unit of PRBC on 11/10.  Hemoglobin is fairly stable.   DVT  prophylaxis: Lovenox subcu Code Status: Full code Family Communication: None Disposition Plan: Anticipate home when medically stable and cleared by nephrology. Advance activities.  Wean off oxygen.  She does use wheelchair most of the time, she has left leg prosthesis however has not used it for long time because it would not fit. Cardiology signed off Discharge when cleared by nephrology.   Consultants:   Cardiology  Nephrology  Procedures:   Cardiac cath, right heart cath 11/13  Antimicrobials:   None   Subjective: Patient seen and examined.  No overnight events.  Creatinine 4.47.  On room air.  Objective: Vitals:   02/13/19 2034 02/14/19 0436 02/14/19 0853 02/14/19 0914  BP: 128/65 (!) 150/90 136/68   Pulse: 84 73 80   Resp: 18 18    Temp: 98.3 F (36.8 C) 97.8 F (36.6 C)    TempSrc: Oral Oral    SpO2: 95% 98%  100%  Weight:  83 kg    Height:        Intake/Output Summary (Last 24 hours) at 02/14/2019 1038 Last data filed at 02/14/2019 0933 Gross per 24 hour  Intake 1290.86 ml  Output 2050 ml  Net -759.14 ml   Filed Weights   02/12/19 0325 02/13/19 0531 02/14/19 0436  Weight: 86.7 kg 84 kg 83 kg    Examination:  General exam: Appears calm and comfortable , she is currently comfortable on room air.  Respiratory system: Clear to auscultation. Respiratory effort normal.   Cardiovascular system: S1 & S2 heard, RRR. No JVD, murmurs, rubs, gallops or clicks.  Gastrointestinal system: Abdomen is nondistended, soft and nontender. No organomegaly or masses felt. Normal bowel sounds heard. Central nervous system: Alert and oriented. No focal neurological deficits. Extremities: Symmetric 5 x 5 power.  Skin: No rashes, lesions or ulcers Psychiatry: Judgement and insight appear normal. Mood & affect appropriate.   Right leg: She has chronic, thick scaly and discolored skin.  Has unstagable ulcer right heel on barrier dressing. Left leg AKA stump , clean and dry.  No edema.     Data Reviewed: I have personally reviewed following labs and imaging studies  CBC: Recent Labs  Lab 02/09/19 0435 02/10/19 0351 02/11/19 0318 02/13/19 0527 02/13/19 0804 02/13/19 0811 02/13/19 1047  WBC 8.1 8.0 8.3 9.9  --   --  10.8*  HGB 7.0* 7.0* 9.2* 9.5* 10.2*  10.2* 9.9* 10.4*  HCT 23.2* 23.3* 28.8* 29.2* 30.0*  30.0* 29.0* 32.9*  MCV 97.5 97.1 93.2 93.0  --   --  93.2  PLT 276 290 267 389  --   --  [id number]   Basic Metabolic Panel: Recent Labs  Lab 02/10/19 0351 02/11/19 0318 02/12/19 0805 02/13/19 0427 02/13/19 0804 02/13/19 0811 02/13/19 1047 02/14/19 0524  NA 142 141 141 141 144  143 144  --  141  K 4.9 4.7 4.3 4.1 4.1  4.2 4.1  --  3.7  CL 116* 113* 107 106  --   --   --  103  CO2 17* 18* 21* 22  --   --   --  23  GLUCOSE 78 78 84 88  --   --   --  105*  BUN 74* 77* 79* 84*  --   --   --  91*  CREATININE 3.88* 3.79* 4.01* 4.10*  --   --  4.24* 4.47*  CALCIUM 7.4* 7.7* 7.8* 7.9*  --   --   --  7.9*  MG 1.3* 1.9 1.7 1.6*  --   --   --  1.5*   GFR: Estimated Creatinine Clearance: 12.7 mL/min (A) (by C-G formula based on SCr of 4.47 mg/dL (H)). Liver Function Tests: No results for input(s): AST, ALT, ALKPHOS, BILITOT, PROT, ALBUMIN in the last 168 hours. No results for input(s): LIPASE, AMYLASE in the last 168 hours. No results for input(s): AMMONIA in the last 168 hours. Coagulation Profile: No results for input(s): INR, PROTIME in the last 168 hours. Cardiac Enzymes: No results for input(s): CKTOTAL, CKMB, CKMBINDEX, TROPONINI in the last 168 hours. BNP (last 3 results) No results for input(s): PROBNP in the last 8760 hours. HbA1C: No results for input(s): HGBA1C in the last 72 hours. CBG: No results for input(s): GLUCAP in the last 168 hours. Lipid Profile: Recent Labs    02/11/19 1430  CHOL 126  HDL 37*  LDLCALC 65  TRIG 118  CHOLHDL 3.4   Thyroid Function Tests: No results for input(s): TSH, T4TOTAL, FREET4, T3FREE,  THYROIDAB in the last 72 hours. Anemia Panel: No results for input(s): VITAMINB12, FOLATE, FERRITIN, TIBC, IRON, RETICCTPCT in the last 72 hours. Sepsis Labs: No results for input(s): PROCALCITON, LATICACIDVEN in the last 168 hours.  Recent Results (from the past 240 hour(s))  SARS CORONAVIRUS 2 (TAT 6-24 HRS) Nasopharyngeal Nasopharyngeal Swab     Status: None   Collection Time: 02/06/19  4:36 AM   Specimen: Nasopharyngeal Swab  Result Value Ref Range Status   SARS Coronavirus 2 NEGATIVE NEGATIVE Final    Comment: (NOTE) SARS-CoV-2 target nucleic acids are NOT DETECTED. The SARS-CoV-2 RNA is generally detectable in upper and lower respiratory specimens during the acute phase of infection. Negative results do not preclude SARS-CoV-2 infection, do not rule out co-infections with other pathogens, and  should not be used as the sole basis for treatment or other patient management decisions. Negative results must be combined with clinical observations, patient history, and epidemiological information. The expected result is Negative. Fact Sheet for Patients: SugarRoll.be Fact Sheet for Healthcare Providers: https://www.woods-mathews.com/ This test is not yet approved or cleared by the [place] FDA and  has been authorized for detection and/or diagnosis of SARS-CoV-2 by FDA under an Emergency Use Authorization (EUA). This EUA will remain  in effect (meaning this test can be used) for the duration of the COVID-19 declaration under Section 56 4(b)(1) of the Act, 21 U.S.C. section 360bbb-3(b)(1), unless the authorization is terminated or revoked sooner. Performed at [facility], [place] [address], [place], [place] [address]   SARS CORONAVIRUS 2 (TAT 6-24 HRS) Nasopharyngeal Nasopharyngeal Swab     Status: None   Collection Time: 02/12/19  9:55 PM   Specimen: Nasopharyngeal Swab  Result Value Ref Range Status   SARS Coronavirus 2  NEGATIVE NEGATIVE Final    Comment: (NOTE) SARS-CoV-2 target nucleic acids are NOT DETECTED. The SARS-CoV-2 RNA is generally detectable in upper and lower respiratory specimens during the acute phase of infection. Negative results do not preclude SARS-CoV-2 infection, do not rule out co-infections with other pathogens, and should not be used as the sole basis for treatment or other patient management decisions. Negative results must be combined with clinical observations, patient history, and epidemiological information. The expected result is Negative. Fact Sheet for Patients: SugarRoll.be Fact Sheet for Healthcare Providers: https://www.woods-mathews.com/ This test is not yet approved or cleared by the [place] FDA and  has been authorized for detection and/or diagnosis of SARS-CoV-2 by FDA under an Emergency Use Authorization (EUA). This EUA will remain  in effect (meaning this test can be used) for the duration of the COVID-19 declaration under Section 56 4(b)(1) of the Act, 21 U.S.C. section 360bbb-3(b)(1), unless the authorization is terminated or revoked sooner. Performed at [facility], [place] [address], [place], [place] [address]          Radiology Studies: No results found.      Scheduled Meds: . amLODipine  10 mg Oral Daily  . aspirin  81 mg Oral Daily  . atorvastatin  40 mg Oral q1800  . enoxaparin (LOVENOX) injection  30 mg Subcutaneous Q24H  . gabapentin  100 mg Oral BID  . influenza vaccine adjuvanted  0.5 mL Intramuscular Tomorrow-1000  . lubriderm seriously sensitive   Topical TID  . mouth rinse  15 mL Mouth Rinse BID  . metoprolol tartrate  50 mg Oral BID  . sildenafil  20 mg Oral TID  . sodium bicarbonate  650 mg Oral TID  . sodium chloride flush  3 mL Intravenous Q12H  . sodium chloride flush  3 mL Intravenous Q12H   Continuous Infusions: . sodium chloride Stopped (02/12/19 2150)  . sodium  chloride 10 mL/hr at 02/13/19 0614  . sodium chloride       LOS: 8 days    Time spent: 30 minutes     Barb Merino, MD [facility] Pager [phone]

## 2019-02-14 NOTE — Progress Notes (Signed)
Progress Note  Patient Name: Stephanie Griffith Date of Encounter: 02/14/2019  Primary Cardiologist: Minus Breeding, MD   Subjective   Denies any CP.  SOB improved.  RHC showed moderate to severe pulmonary HTN but dry based on RA and PCW of 22mmHg.   Inpatient Medications    Scheduled Meds: . amLODipine  10 mg Oral Daily  . aspirin  81 mg Oral Daily  . atorvastatin  40 mg Oral q1800  . enoxaparin (LOVENOX) injection  30 mg Subcutaneous Q24H  . gabapentin  100 mg Oral BID  . influenza vaccine adjuvanted  0.5 mL Intramuscular Tomorrow-1000  . lubriderm seriously sensitive   Topical TID  . mouth rinse  15 mL Mouth Rinse BID  . metoprolol tartrate  50 mg Oral BID  . sildenafil  20 mg Oral TID  . sodium bicarbonate  650 mg Oral TID  . sodium chloride flush  3 mL Intravenous Q12H  . sodium chloride flush  3 mL Intravenous Q12H   Continuous Infusions: . sodium chloride Stopped (02/12/19 2150)  . sodium chloride 10 mL/hr at 02/13/19 0614  . sodium chloride     PRN Meds: sodium chloride, sodium chloride, acetaminophen, hydrALAZINE, ondansetron (ZOFRAN) IV, polyethylene glycol, senna-docusate, sodium chloride flush, sodium chloride flush, zolpidem   Vital Signs    Vitals:   02/13/19 1132 02/13/19 1202 02/13/19 2034 02/14/19 0436  BP: (!) 111/53 (!) 165/106 128/65 (!) 150/90  Pulse: 68 66 84 73  Resp:   18 18  Temp:   98.3 F (36.8 C) 97.8 F (36.6 C)  TempSrc:   Oral Oral  SpO2: 99% (!) 57% 95% 98%  Weight:    83 kg  Height:        Intake/Output Summary (Last 24 hours) at 02/14/2019 [id number] Last data filed at 02/14/2019 0500 Gross per 24 hour  Intake 1170.86 ml  Output 1800 ml  Net -629.14 ml   Last 3 Weights 02/14/2019 02/13/2019 02/12/2019  Weight (lbs) 182 lb 15.7 oz 185 lb 3 oz 191 lb 2.2 oz  Weight (kg) 83 kg 84 kg 86.7 kg      Telemetry   NSR - Personally Reviewed  ECG    No new EKG to review- Personally Reviewed  Physical Exam   GEN: Well nourished,  well developed in no acute distress HEENT: Normal NECK: No JVD; No carotid bruits LYMPHATICS: No lymphadenopathy CARDIAC:RRR, no murmurs, rubs, gallops RESPIRATORY:  Clear to auscultation without rales, wheezing or rhonchi  ABDOMEN: Soft, non-tender, non-distended MUSCULOSKELETAL:  No edema; No deformity  SKIN: Warm and dry NEUROLOGIC:  Alert and oriented x 3 PSYCHIATRIC:  Normal affect    Labs    High Sensitivity Troponin:   Recent Labs  Lab 02/05/19 1805 02/06/19 0205 02/06/19 0541  TROPONINIHS 59* 59* 49*      Chemistry Recent Labs  Lab 02/12/19 0805 02/13/19 0427 02/13/19 0804 02/13/19 0811 02/13/19 1047 02/14/19 0524  NA 141 141 144  143 144  --  141  K 4.3 4.1 4.1  4.2 4.1  --  3.7  CL 107 106  --   --   --  103  CO2 21* 22  --   --   --  23  GLUCOSE 84 88  --   --   --  105*  BUN 79* 84*  --   --   --  91*  CREATININE 4.01* 4.10*  --   --  4.24* 4.47*  CALCIUM 7.8* 7.9*  --   --   --  7.9*  GFRNONAA 10* 10*  --   --  10* 9*  GFRAA 12* 12*  --   --  11* 11*  ANIONGAP 13 13  --   --   --  15     Hematology Recent Labs  Lab 02/11/19 0318 02/13/19 0527 02/13/19 0804 02/13/19 0811 02/13/19 1047  WBC 8.3 9.9  --   --  10.8*  RBC 3.09* 3.14*  --   --  3.53*  HGB 9.2* 9.5* 10.2*  10.2* 9.9* 10.4*  HCT 28.8* 29.2* 30.0*  30.0* 29.0* 32.9*  MCV 93.2 93.0  --   --  93.2  MCH 29.8 30.3  --   --  29.5  MCHC 31.9 32.5  --   --  31.6  RDW 16.1* 16.0*  --   --  15.9*  PLT 267 389  --   --  288    BNP No results for input(s): BNP, PROBNP in the last 168 hours.   DDimer  No results for input(s): DDIMER in the last 168 hours.   Radiology    No results found.  Cardiac Studies   Echo 02/06/19 1. Left ventricular ejection fraction, by visual estimation, is 60 to 65%. The left ventricle has normal function. There is moderately increased left ventricular hypertrophy. 2. Left ventricular diastolic parameters are indeterminate. 3. Right ventricular  volume and pressure overload. 4. Global right ventricle has mildly reduced systolic function.The right ventricular size is moderately enlarged. 5. Right atrial size was severely dilated. 6. The mitral valve is normal in structure. No evidence of mitral valve regurgitation. 7. The tricuspid valve is normal in structure. Tricuspid valve regurgitation mild-moderate. 8. The aortic valve is tricuspid. Aortic valve regurgitation is mild. 9. Severely elevated pulmonary artery systolic pressure. 10. The tricuspid regurgitant velocity is 4.86 m/s, and with an assumed right atrial pressure of 15 mmHg, the estimated right ventricular systolic pressure is severely elevated at 109.5 mmHg. 11. The inferior vena cava is dilated in size with <50% respiratory variability, suggesting right atrial pressure of 15 mmHg.  Right heart cath Conclusion  Findings:  RA = 1 RV = 80/1 PA = 83/22 (42) PCW = 3 Fick cardiac output/index = 5.9/2.9 PVR = 6.6 FA sat = 95% PA sat = 64%, 66%  Assessment:  1. 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 Low left-sided filling pressures 3. Normal cardiac output 4. No evidence of intracardiac shunting  Plan/Discussion:  Start sildenafil. F/u in [facility] for initiation of combination therapy.       Patient Profile     73 y.o. female with a hx of chronic diastolic heart failure, previously moderate (now severe) pulmonary HTN by echo, CKD IV, anemia of chronic disease, HTN, remote paroxysmal atrial fibrillation, COPD, PAD (s/pfemoral to below knee popliteal bypassthen L AKA), now admitted with acute on chronic diastolic heart failure  Assessment & Plan    1.  Moderate to severe pulmonary hypertension -RHC with PAP 83/58mmHg with mean 68mmHg. - Dry based on RA pressures and wedge of 3.  -However, significantly elevated pulmonary pressures despite low filling pressures.  -Echo this admission with RVSP 109.5 mmHg prior to  diuresis. -Given lack of E/e' elevated and normal left atrial size, with RV appearing worse than LV suspect WHO group 1 and no known history of PE, sarcoid, autoimmune, etc.  -started on sildenafil -stopped diuresis given her filling pressures -suspect she will need outpatient diuretic regimen per nephrology, but no  longer needs IV diuresis based on RHC numbers.  -Would recommend not restarting diuretics  but could consider maintenance restart of oral medication in near future, defer to nephrology team. -will arrange followup in AHF clinic for initiation of combination therapy for  pulmonary HTN  2. Acute on chronic diastolic HF -findings of RHC support this is PH more than diastolic heart failure given elevated PA pressures with normal wedge. -diuretics stopped -restart diuretics per nephrology  3.Paroxysmal afib -isolated episode some years ago.  -ossible recurrent episode this admission noted on tele, transient, some SVT -with anemia issues would not start anticoag at this time.  -However, with possible PAH, concern for increased clotting risk. Hgb still low but improved with diuresis. -Will need to weigh risk/balance of anticoagulation if her Hgb remains stable on outpatient follow up. -with PAH and renal disease, the most data is for warfarin, which will require coumadin clinic and INR monitoring.  -With a history of paroxysmal afib and chadsvasc=5, she is high risk for stroke -currently SR -continue metoprolol 50 mg BID  4.HTN -BP much improved today at 128/65 - 150/53mmHg -continue metoprolol 50mg  BID and amlodipine 10mg  daily -started on sildenafil  -has hydralazine PRN.   5.  PAD, hx of Lt AKA  -continue aspirin -restarted atorvastatin 40 mg this admission  [facility] will sign off.   Medication Recommendations:  Amlodipine 10mg  daily, Lopressor 50mg  BID, Sildenafil 20mg  TID Other recommendations (labs, testing, etc):  none Follow up as an outpatient:  [facility] in 7-10 days  For questions or updates, please contact [place] Please consult [email or web address] for contact info under     Signed, Fransico Him, MD  02/14/2019, 8:08 AM

## 2019-02-14 NOTE — Progress Notes (Signed)
Call placed to both daughters phone numbers on the chart to update current status. unable to communicate , no voice message left.

## 2019-02-14 NOTE — Progress Notes (Signed)
[facility] Progress Note  Subjective:   SCr up to 4.5, off diuretics  KI 3.7  Pt w/o complaint, on RA  BP stable  1.8L UOP yesterday  Down 17kg total  Vitals:   02/13/19 2034 02/14/19 0436 02/14/19 0853 02/14/19 0914  BP: 128/65 (!) 150/90 136/68   Pulse: 84 73 80   Resp: 18 18    Temp: 98.3 F (36.8 C) 97.8 F (36.6 C)    TempSrc: Oral Oral    SpO2: 95% 98%  100%  Weight:  83 kg    Height:        Inpatient medications: . amLODipine  10 mg Oral Daily  . aspirin  81 mg Oral Daily  . atorvastatin  40 mg Oral q1800  . enoxaparin (LOVENOX) injection  30 mg Subcutaneous Q24H  . gabapentin  100 mg Oral BID  . influenza vaccine adjuvanted  0.5 mL Intramuscular Tomorrow-1000  . lubriderm seriously sensitive   Topical TID  . mouth rinse  15 mL Mouth Rinse BID  . metoprolol tartrate  50 mg Oral BID  . sildenafil  20 mg Oral TID  . sodium bicarbonate  650 mg Oral TID  . sodium chloride flush  3 mL Intravenous Q12H  . sodium chloride flush  3 mL Intravenous Q12H   . sodium chloride Stopped (02/12/19 2150)  . sodium chloride 10 mL/hr at 02/13/19 0614  . sodium chloride     sodium chloride, sodium chloride, acetaminophen, hydrALAZINE, ondansetron (ZOFRAN) IV, polyethylene glycol, senna-docusate, sodium chloride flush, sodium chloride flush, zolpidem    Exam: Gen  NAD, pleasant, on nasal cannula +JVD Chest mostly clear, occ rales , no wheezing, no ^wob RRR no MRG Soft, nontender GU purewick draining pale yellow urine Left AKA, right lower extremity with scaling and hyperpigmentation consistent with chronic venous stasis Neuro is alert, Ox 3 , nf    Home meds:  - amlodipine 10/ metoprolol 50 bid  - gabapentin 100 bid  - aspirin 81      Date             Creat               eGFR  2009- 10         1.93- 3.11  2013               1.46- 2.97          2017               1.66- 2.79        16- 30  Feb 2019        2.06                 23  02/05/2019       3.17                 16, stage IV  02/07/2019       3.36                 15, stage IV-V    Na 145 K 5.0  CO2 13  BUN 64  Cr 3.36  Mg 1.0 Ca 6.9  Alb 2.4   WBC 10k  Hb 7.5  plt 293   CXR 11/5 > Cardiomegaly and pulmonary vascular congestion without overt edema  ECHO - LVEF 60%, RV pressure/ vol overload, RV slightly reduced fxn. RA severely dilated, severe ^RV sys 148m Hg. IVC dilated w/ low resp  variability suggesting RA pressure 15m Hg.   Assessment/ Plan: 1. Renal failure - since 2013 - Feb 2019 patient has had mostly CKD IV disease w/ creat 1.6- 2.5.  Favor progressive CKD.  No uremic symptoms.    Some loss of GFR with diuresis, after RHC diuretics stopped on 11/13, will need to resume oral diuretics once GFR stabilized.  Cont off diuretics today.   2. Pulmonary hypertension, RHC 11/13, started sildenafil, per cardiology 3. HTN - cont meds BP's reasonably well controlled 4. Met acidosis - po bicarb started, stable at 21 5. SP L AKA 6. Obesity 7. COPD 8. Anemia, transfuse as indicated 9. Hypomagnesemia, Diuretic/diet related, replete aggressively  RRexene Agent 02/14/2019, 9:57 AM  Iron/TIBC/Ferritin/ %Sat    Component Value Date/Time   IRON 27 (L) 02/08/2019 0401   TIBC 165 (L) 02/08/2019 0401   FERRITIN 73 02/08/2019 0401   IRONPCTSAT 16 02/08/2019 0401   Recent Labs  Lab 02/14/19 0524  NA 141  K 3.7  CL 103  CO2 23  GLUCOSE 105*  BUN 91*  CREATININE 4.47*  CALCIUM 7.9*   No results for input(s): AST, ALT, ALKPHOS, BILITOT, PROT in the last 168 hours. Recent Labs  Lab 02/13/19 1047  WBC 10.8*  HGB 10.4*  HCT 32.9*  PLT 288

## 2019-02-15 LAB — CBC WITH DIFFERENTIAL/PLATELET
Abs Immature Granulocytes: 0.17 10*3/uL — ABNORMAL HIGH (ref 0.00–0.07)
Basophils Absolute: 0.1 10*3/uL (ref 0.0–0.1)
Basophils Relative: 1 %
Eosinophils Absolute: 0.5 10*3/uL (ref 0.0–0.5)
Eosinophils Relative: 4 %
HCT: 30.6 % — ABNORMAL LOW (ref 36.0–46.0)
Hemoglobin: 9.7 g/dL — ABNORMAL LOW (ref 12.0–15.0)
Immature Granulocytes: 2 %
Lymphocytes Relative: 16 %
Lymphs Abs: 1.8 10*3/uL (ref 0.7–4.0)
MCH: 30.1 pg (ref 26.0–34.0)
MCHC: 31.7 g/dL (ref 30.0–36.0)
MCV: 95 fL (ref 80.0–100.0)
Monocytes Absolute: 1.5 10*3/uL — ABNORMAL HIGH (ref 0.1–1.0)
Monocytes Relative: 14 %
Neutro Abs: 7.2 10*3/uL (ref 1.7–7.7)
Neutrophils Relative %: 63 %
Platelets: 280 10*3/uL (ref 150–400)
RBC: 3.22 MIL/uL — ABNORMAL LOW (ref 3.87–5.11)
RDW: 15.9 % — ABNORMAL HIGH (ref 11.5–15.5)
WBC: 11.2 10*3/uL — ABNORMAL HIGH (ref 4.0–10.5)
nRBC: 0 % (ref 0.0–0.2)

## 2019-02-15 LAB — BASIC METABOLIC PANEL
Anion gap: 14 (ref 5–15)
BUN: 90 mg/dL — ABNORMAL HIGH (ref 8–23)
CO2: 22 mmol/L (ref 22–32)
Calcium: 7.8 mg/dL — ABNORMAL LOW (ref 8.9–10.3)
Chloride: 108 mmol/L (ref 98–111)
Creatinine, Ser: 4.06 mg/dL — ABNORMAL HIGH (ref 0.44–1.00)
GFR calc Af Amer: 12 mL/min — ABNORMAL LOW (ref >60)
GFR calc non Af Amer: 10 mL/min — ABNORMAL LOW (ref >60)
Glucose, Bld: 92 mg/dL (ref 70–99)
Potassium: 3.7 mmol/L (ref 3.5–5.1)
Sodium: 144 mmol/L (ref 135–145)

## 2019-02-15 LAB — MAGNESIUM: Magnesium: 1.4 mg/dL — ABNORMAL LOW (ref 1.7–2.4)

## 2019-02-15 NOTE — Progress Notes (Signed)
[facility] Progress Note  Subjective:   No c/o  Dtr on phone, updated  On RA, BP stable  SCr improved to 4.1, K 3.7 HCO3 22  Not on diuretics  Is on NaHCO3  LEE stable, overall much improved  1.1L + 2 voids yest off diuretics  Vitals:   02/14/19 1329 02/15/19 0602 02/15/19 1033 02/15/19 1037  BP: (!) 122/54 139/62 (!) 122/59 (!) 122/59  Pulse: 80 79  82  Resp: 20 16    Temp: 98.1 F (36.7 C) 98.9 F (37.2 C)    TempSrc: Oral     SpO2: 94% 91%  93%  Weight:  84.5 kg    Height:        Inpatient medications: . amLODipine  10 mg Oral Daily  . aspirin  81 mg Oral Daily  . atorvastatin  40 mg Oral q1800  . enoxaparin (LOVENOX) injection  30 mg Subcutaneous Q24H  . gabapentin  100 mg Oral BID  . influenza vaccine adjuvanted  0.5 mL Intramuscular Tomorrow-1000  . lubriderm seriously sensitive   Topical TID  . mouth rinse  15 mL Mouth Rinse BID  . metoprolol tartrate  50 mg Oral BID  . sildenafil  20 mg Oral TID  . sodium bicarbonate  650 mg Oral TID  . sodium chloride flush  3 mL Intravenous Q12H  . sodium chloride flush  3 mL Intravenous Q12H   . sodium chloride Stopped (02/12/19 2150)  . sodium chloride 10 mL/hr at 02/13/19 0614  . sodium chloride     sodium chloride, sodium chloride, acetaminophen, hydrALAZINE, ondansetron (ZOFRAN) IV, polyethylene glycol, senna-docusate, sodium chloride flush, sodium chloride flush, zolpidem    Exam: Gen  NAD, pleasant, on nasal cannula +JVD Chest mostly clear, occ rales , no wheezing, no ^wob RRR no MRG Soft, nontender GU purewick draining pale yellow urine Left AKA, right lower extremity with scaling and hyperpigmentation consistent with chronic venous stasis Neuro is alert, Ox 3 , nf    Home meds:  - amlodipine 10/ metoprolol 50 bid  - gabapentin 100 bid  - aspirin 81      Date             Creat               eGFR  2009- 10         1.93- 3.11  2013               1.46- 2.97          2017                1.66- 2.79        16- 30  Feb 2019        2.06                 23  02/05/2019       3.17                 16, stage IV  02/07/2019       3.36                 15, stage IV-V    Na 145 K 5.0  CO2 13  BUN 64  Cr 3.36  Mg 1.0 Ca 6.9  Alb 2.4   WBC 10k  Hb 7.5  plt 293   CXR 11/5 > Cardiomegaly and pulmonary vascular congestion without overt edema  ECHO - LVEF 60%, RV  pressure/ vol overload, RV slightly reduced fxn. RA severely dilated, severe ^RV sys 117m Hg. IVC dilated w/ low resp variability suggesting RA pressure 161mHg.   Assessment/ Plan: 1. Renal failure - since 2013 - Feb 2019 patient has had mostly CKD IV disease w/ creat 1.6- 2.5.  Favor progressive CKD.  No uremic symptoms.    Some loss of GFR with diuresis, after RHC diuretics stopped on 11/13, Cont another 24h w/o diuretics, anticipate start PO diuretics tomorrow if GFR cont to be stable/improved 2. Pulmonary hypertension, RHC 11/13, started sildenafil, per cardiology 3. HTN - cont meds BP's reasonably well controlled 4. Met acidosis - po bicarb started, stable >20 5. SP L AKA 6. Obesity 7. COPD 8. Anemia, transfuse as indicated 9. Hypomagnesemia, Diuretic/diet related, replete aggressively  RyRexene Agent11/15/2020, 10:51 AM  Iron/TIBC/Ferritin/ %Sat    Component Value Date/Time   IRON 27 (L) 02/08/2019 0401   TIBC 165 (L) 02/08/2019 0401   FERRITIN 73 02/08/2019 0401   IRONPCTSAT 16 02/08/2019 0401   Recent Labs  Lab 02/15/19 0356  NA 144  K 3.7  CL 108  CO2 22  GLUCOSE 92  BUN 90*  CREATININE 4.06*  CALCIUM 7.8*   No results for input(s): AST, ALT, ALKPHOS, BILITOT, PROT in the last 168 hours. Recent Labs  Lab 02/15/19 0356  WBC 11.2*  HGB 9.7*  HCT 30.6*  PLT 280

## 2019-02-15 NOTE — Plan of Care (Signed)
°  Problem: Education: °Goal: Ability to demonstrate management of disease process will improve °Outcome: Progressing °Goal: Ability to verbalize understanding of medication therapies will improve °Outcome: Progressing °Goal: Individualized Educational Video(s) °Outcome: Progressing °  °

## 2019-02-15 NOTE — Progress Notes (Signed)
PROGRESS NOTE    Stephanie Griffith  [name] DOB: October 22, 1945 DOA: 02/05/2019 PCP: [name], MD    Brief Narrative:  73 y.o.femalewith medical history significant forchronic kidney disease stage IV, peripheral vascular disease status post left AKA, hypertension, chronic anemia, and chronic diastolic CHF, presented to the emergency department with 1 week of progressive leg swelling and shortness of breath.  Found to be in CHF exacerbation complicated by CKD. She was diuresed , renal functions exacerbated. [place] 11/13 with moderate to severe PAH, normal EF  Assessment & Plan:   Principal Problem:   Acute on chronic diastolic CHF (congestive heart failure) (HCC) Active Problems:   Normocytic anemia   Essential hypertension, benign   Hyperkalemia   Chronic renal insufficiency, stage IV (severe) (HCC)   Metabolic acidosis  Acute on chronic congestive heart failure with preserved ejection fraction: Treated with aggressive diuresis, clinically improving, on room air now. Right heart cath with normal ejection fraction, shows moderate to severe pulmonary hypertension. Intake and output monitoring.  Monitor electrolytes. Followed by nephrology, off diuretics. Creatinine still elevated but trending down. followed by nephrology.  Recheck tomorrow morning.  Moderate to severe pulmonary hypertension: Diagnosed with right heart cath. Started on sildenafil.  Will need outpatient follow-up and monitoring.  Acute kidney injury on chronic kidney disease stage IV: Baseline creatinine reported to.  Admission creatinine was 3.1.  Trended up and now improving. Renal ultrasound with chronic kidney disease.  Followed by nephrology.  Currently no evidence of uremia.  Potassium is normal.  Hypertension: Blood pressure stable. On amiodarone, hydralazine and beta-blockers.  Anemia of chronic disease: Baseline hemoglobin 8.  Received 1 unit of PRBC on 11/10.  Hemoglobin is fairly stable.   DVT  prophylaxis: Lovenox subcu Code Status: Full code Family Communication: None Disposition Plan: Anticipate home when medically stable and cleared by nephrology. Advance activities.  Wean off oxygen.  She does use wheelchair most of the time, she has left leg prosthesis however has not used it for long time because it would not fit. Cardiology signed off Discharge when cleared by nephrology.   Consultants:   Cardiology  Nephrology  Procedures:   Cardiac cath, right heart cath 11/13  Antimicrobials:   None   Subjective: Patient seen and examined.  No overnight events.  On room air.  Wants to go home.  Objective: Vitals:   02/14/19 1329 02/15/19 0602 02/15/19 1033 02/15/19 1037  BP: (!) 122/54 139/62 (!) 122/59 (!) 122/59  Pulse: 80 79  82  Resp: 20 16    Temp: 98.1 F (36.7 C) 98.9 F (37.2 C)    TempSrc: Oral     SpO2: 94% 91%  93%  Weight:  84.5 kg    Height:        Intake/Output Summary (Last 24 hours) at 02/15/2019 1111 Last data filed at 02/15/2019 0743 Gross per 24 hour  Intake 120 ml  Output 650 ml  Net -530 ml   Filed Weights   02/13/19 0531 02/14/19 0436 02/15/19 0602  Weight: 84 kg 83 kg 84.5 kg    Examination:  General exam: Appears calm and comfortable , she is currently comfortable on room air.  Respiratory system: Clear to auscultation. Respiratory effort normal.   Cardiovascular system: S1 & S2 heard, RRR. No JVD, murmurs, rubs, gallops or clicks.  Gastrointestinal system: Abdomen is nondistended, soft and nontender. No organomegaly or masses felt. Normal bowel sounds heard. Central nervous system: Alert and oriented. No focal neurological deficits. Extremities: Symmetric 5  x 5 power. Skin: No rashes, lesions or ulcers Psychiatry: Judgement and insight appear normal. Mood & affect appropriate.   Right leg: She has chronic, thick scaly and discolored skin.  Left leg AKA stump , clean and dry. No edema.     Data Reviewed: I have  personally reviewed following labs and imaging studies  CBC: Recent Labs  Lab 02/10/19 0351 02/11/19 0318 02/13/19 0527 02/13/19 0804 02/13/19 0811 02/13/19 1047 02/15/19 0356  WBC 8.0 8.3 9.9  --   --  10.8* 11.2*  NEUTROABS  --   --   --   --   --   --  7.2  HGB 7.0* 9.2* 9.5* 10.2*  10.2* 9.9* 10.4* 9.7*  HCT 23.3* 28.8* 29.2* 30.0*  30.0* 29.0* 32.9* 30.6*  MCV 97.1 93.2 93.0  --   --  93.2 95.0  PLT 290 267 389  --   --  288 [id number]   Basic Metabolic Panel: Recent Labs  Lab 02/11/19 0318 02/12/19 0805 02/13/19 0427 02/13/19 0804 02/13/19 0811 02/13/19 1047 02/14/19 0524 02/15/19 0356  NA 141 141 141 144  143 144  --  141 144  K 4.7 4.3 4.1 4.1  4.2 4.1  --  3.7 3.7  CL 113* 107 106  --   --   --  103 108  CO2 18* 21* 22  --   --   --  23 22  GLUCOSE 78 84 88  --   --   --  105* 92  BUN 77* 79* 84*  --   --   --  91* 90*  CREATININE 3.79* 4.01* 4.10*  --   --  4.24* 4.47* 4.06*  CALCIUM 7.7* 7.8* 7.9*  --   --   --  7.9* 7.8*  MG 1.9 1.7 1.6*  --   --   --  1.5* 1.4*   GFR: Estimated Creatinine Clearance: 14 mL/min (A) (by C-G formula based on SCr of 4.06 mg/dL (H)). Liver Function Tests: No results for input(s): AST, ALT, ALKPHOS, BILITOT, PROT, ALBUMIN in the last 168 hours. No results for input(s): LIPASE, AMYLASE in the last 168 hours. No results for input(s): AMMONIA in the last 168 hours. Coagulation Profile: No results for input(s): INR, PROTIME in the last 168 hours. Cardiac Enzymes: No results for input(s): CKTOTAL, CKMB, CKMBINDEX, TROPONINI in the last 168 hours. BNP (last 3 results) No results for input(s): PROBNP in the last 8760 hours. HbA1C: No results for input(s): HGBA1C in the last 72 hours. CBG: No results for input(s): GLUCAP in the last 168 hours. Lipid Profile: No results for input(s): CHOL, HDL, LDLCALC, TRIG, CHOLHDL, LDLDIRECT in the last 72 hours. Thyroid Function Tests: No results for input(s): TSH, T4TOTAL, FREET4, T3FREE,  THYROIDAB in the last 72 hours. Anemia Panel: No results for input(s): VITAMINB12, FOLATE, FERRITIN, TIBC, IRON, RETICCTPCT in the last 72 hours. Sepsis Labs: No results for input(s): PROCALCITON, LATICACIDVEN in the last 168 hours.  Recent Results (from the past 240 hour(s))  SARS CORONAVIRUS 2 (TAT 6-24 HRS) Nasopharyngeal Nasopharyngeal Swab     Status: None   Collection Time: 02/06/19  4:36 AM   Specimen: Nasopharyngeal Swab  Result Value Ref Range Status   SARS Coronavirus 2 NEGATIVE NEGATIVE Final    Comment: (NOTE) SARS-CoV-2 target nucleic acids are NOT DETECTED. The SARS-CoV-2 RNA is generally detectable in upper and lower respiratory specimens during the acute phase of infection. Negative results do not preclude SARS-CoV-2 infection, do not  rule out co-infections with other pathogens, and should not be used as the sole basis for treatment or other patient management decisions. Negative results must be combined with clinical observations, patient history, and epidemiological information. The expected result is Negative. Fact Sheet for Patients: SugarRoll.be Fact Sheet for Healthcare Providers: https://www.woods-mathews.com/ This test is not yet approved or cleared by the [place] FDA and  has been authorized for detection and/or diagnosis of SARS-CoV-2 by FDA under an Emergency Use Authorization (EUA). This EUA will remain  in effect (meaning this test can be used) for the duration of the COVID-19 declaration under Section 56 4(b)(1) of the Act, 21 U.S.C. section 360bbb-3(b)(1), unless the authorization is terminated or revoked sooner. Performed at [facility], [place] [address] [place], [place] [address]   SARS CORONAVIRUS 2 (TAT 6-24 HRS) Nasopharyngeal Nasopharyngeal Swab     Status: None   Collection Time: 02/12/19  9:55 PM   Specimen: Nasopharyngeal Swab  Result Value Ref Range Status   SARS Coronavirus 2  NEGATIVE NEGATIVE Final    Comment: (NOTE) SARS-CoV-2 target nucleic acids are NOT DETECTED. The SARS-CoV-2 RNA is generally detectable in upper and lower respiratory specimens during the acute phase of infection. Negative results do not preclude SARS-CoV-2 infection, do not rule out co-infections with other pathogens, and should not be used as the sole basis for treatment or other patient management decisions. Negative results must be combined with clinical observations, patient history, and epidemiological information. The expected result is Negative. Fact Sheet for Patients: SugarRoll.be Fact Sheet for Healthcare Providers: https://www.woods-mathews.com/ This test is not yet approved or cleared by the [place] FDA and  has been authorized for detection and/or diagnosis of SARS-CoV-2 by FDA under an Emergency Use Authorization (EUA). This EUA will remain  in effect (meaning this test can be used) for the duration of the COVID-19 declaration under Section 56 4(b)(1) of the Act, 21 U.S.C. section 360bbb-3(b)(1), unless the authorization is terminated or revoked sooner. Performed at [facility], [place] [address] [place], [place] [address]          Radiology Studies: No results found.      Scheduled Meds: . amLODipine  10 mg Oral Daily  . aspirin  81 mg Oral Daily  . atorvastatin  40 mg Oral q1800  . enoxaparin (LOVENOX) injection  30 mg Subcutaneous Q24H  . gabapentin  100 mg Oral BID  . influenza vaccine adjuvanted  0.5 mL Intramuscular Tomorrow-1000  . lubriderm seriously sensitive   Topical TID  . mouth rinse  15 mL Mouth Rinse BID  . metoprolol tartrate  50 mg Oral BID  . sildenafil  20 mg Oral TID  . sodium bicarbonate  650 mg Oral TID  . sodium chloride flush  3 mL Intravenous Q12H  . sodium chloride flush  3 mL Intravenous Q12H   Continuous Infusions: . sodium chloride Stopped (02/12/19 2150)  . sodium  chloride 10 mL/hr at 02/13/19 0614  . sodium chloride       LOS: 9 days    Time spent: 25 minutes     Barb Merino, MD [facility] Pager [phone]

## 2019-02-15 NOTE — Progress Notes (Signed)
Patient resting comfortably during shift report. Denies complaints.  

## 2019-02-15 NOTE — Progress Notes (Signed)
   Vital Signs MEWS/VS Documentation       02/15/2019 0755 02/15/2019 1033 02/15/2019 1037 02/15/2019 1300   MEWS Score:  0  0  0  0   MEWS Score Color:  Green  Green  Green  Green   Resp:  --  --  --  20   Pulse:  --  --  82  80   BP:  --  (!) 122/59  (!) 122/59  128/63   Temp:  --  --  --  98.4 F (36.9 C)   O2 Device:  --  --  Room [employer]   Level of Consciousness:  Alert  --  --  --            Maud Deed Tobias-Diakun 02/15/2019,6:51 PM

## 2019-02-15 NOTE — Progress Notes (Signed)
Patient's daughter, Glenard Haring, is at bedside. Pt and daughter express understanding that patient will not be discharged today.

## 2019-02-16 LAB — CBC WITH DIFFERENTIAL/PLATELET
Abs Immature Granulocytes: 0.18 10*3/uL — ABNORMAL HIGH (ref 0.00–0.07)
Basophils Absolute: 0.1 10*3/uL (ref 0.0–0.1)
Basophils Relative: 1 %
Eosinophils Absolute: 0.4 10*3/uL (ref 0.0–0.5)
Eosinophils Relative: 4 %
HCT: 30.2 % — ABNORMAL LOW (ref 36.0–46.0)
Hemoglobin: 9.2 g/dL — ABNORMAL LOW (ref 12.0–15.0)
Immature Granulocytes: 1 %
Lymphocytes Relative: 16 %
Lymphs Abs: 2 10*3/uL (ref 0.7–4.0)
MCH: 29.7 pg (ref 26.0–34.0)
MCHC: 30.5 g/dL (ref 30.0–36.0)
MCV: 97.4 fL (ref 80.0–100.0)
Monocytes Absolute: 1.6 10*3/uL — ABNORMAL HIGH (ref 0.1–1.0)
Monocytes Relative: 13 %
Neutro Abs: 8.1 10*3/uL — ABNORMAL HIGH (ref 1.7–7.7)
Neutrophils Relative %: 65 %
Platelets: 279 10*3/uL (ref 150–400)
RBC: 3.1 MIL/uL — ABNORMAL LOW (ref 3.87–5.11)
RDW: 16 % — ABNORMAL HIGH (ref 11.5–15.5)
WBC: 12.4 10*3/uL — ABNORMAL HIGH (ref 4.0–10.5)
nRBC: 0 % (ref 0.0–0.2)

## 2019-02-16 LAB — BASIC METABOLIC PANEL
Anion gap: 13 (ref 5–15)
BUN: 92 mg/dL — ABNORMAL HIGH (ref 8–23)
CO2: 22 mmol/L (ref 22–32)
Calcium: 8 mg/dL — ABNORMAL LOW (ref 8.9–10.3)
Chloride: 106 mmol/L (ref 98–111)
Creatinine, Ser: 4.38 mg/dL — ABNORMAL HIGH (ref 0.44–1.00)
GFR calc Af Amer: 11 mL/min — ABNORMAL LOW (ref >60)
GFR calc non Af Amer: 9 mL/min — ABNORMAL LOW (ref >60)
Glucose, Bld: 99 mg/dL (ref 70–99)
Potassium: 3.8 mmol/L (ref 3.5–5.1)
Sodium: 141 mmol/L (ref 135–145)

## 2019-02-16 MED ORDER — FUROSEMIDE 40 MG PO TABS
40.0000 mg | ORAL_TABLET | Freq: Two times a day (BID) | ORAL | Status: DC
  Administered 2019-02-17: 40 mg via ORAL
  Filled 2019-02-16 (×2): qty 1

## 2019-02-16 NOTE — Progress Notes (Signed)
PROGRESS NOTE    Stephanie Griffith  [id number] DOB: 15-Sep-1945 DOA: 02/05/2019 PCP: [name], MD    Brief Narrative:  73 y.o.femalewith medical history significant forchronic kidney disease stage IV, peripheral vascular disease status post left AKA, hypertension, chronic anemia, and chronic diastolic CHF, presented to the emergency department with 1 week of progressive leg swelling and shortness of breath.  Found to be in CHF exacerbation complicated by CKD. She was diuresed , renal functions exacerbated. [place] 11/13 with moderate to severe PAH, normal EF  Assessment & Plan:   Principal Problem:   Acute on chronic diastolic CHF (congestive heart failure) (HCC) Active Problems:   Normocytic anemia   Essential hypertension, benign   Hyperkalemia   Chronic renal insufficiency, stage IV (severe) (HCC)   Metabolic acidosis  Acute on chronic congestive heart failure with preserved ejection fraction: Treated with aggressive diuresis, clinically improving, on room air now. Right heart cath with normal ejection fraction, shows moderate to severe pulmonary hypertension. Intake and output monitoring.  Monitor electrolytes. Followed by nephrology, off diuretics. Creatinine still elevated.  Moderate to severe pulmonary hypertension: Diagnosed with right heart cath. Started on sildenafil.  Will need outpatient follow-up and monitoring.  Acute kidney injury on chronic kidney disease stage IV: Baseline creatinine reported to.  Admission creatinine was 3.1.  Trended up and fluctuating. 4.38 today. Followed by nephrology.  Currently no evidence of uremia.  Potassium is normal.  Hypertension: Blood pressure stable. On amiodarone, hydralazine and beta-blockers.  Anemia of chronic disease: Baseline hemoglobin 8.  Received 1 unit of PRBC on 11/10.  Hemoglobin is fairly stable.   DVT prophylaxis: Lovenox subcu Code Status: Full code Family Communication: None Disposition Plan: Anticipate  home when medically stable and cleared by nephrology. Renal functions still elevated.   Consultants:   Cardiology  Nephrology  Procedures:   Cardiac cath, right heart cath 11/13  Antimicrobials:   None   Subjective: Patient seen and examined.  No overnight events.  On room air.  " when am I going home?"  Objective: Vitals:   02/15/19 1300 02/15/19 2037 02/16/19 0645 02/16/19 0720  BP: 128/63 128/62 (!) 133/58 (!) 117/94  Pulse: 80 86 82 82  Resp: 20 18 18    Temp: 98.4 F (36.9 C) 98.9 F (37.2 C) 99 F (37.2 C) 98.7 F (37.1 C)  TempSrc: Oral Oral Oral Oral  SpO2: 96% 96% 93% 92%  Weight:   83.5 kg   Height:        Intake/Output Summary (Last 24 hours) at 02/16/2019 0806 Last data filed at 02/16/2019 0545 Gross per 24 hour  Intake 240 ml  Output 750 ml  Net -510 ml   Filed Weights   02/14/19 0436 02/15/19 0602 02/16/19 0645  Weight: 83 kg 84.5 kg 83.5 kg    Examination:  General exam: Appears calm and comfortable , she is currently comfortable on room air.  Respiratory system: Clear to auscultation. Respiratory effort normal.   Cardiovascular system: S1 & S2 heard, RRR. No JVD, murmurs, rubs, gallops or clicks.  Gastrointestinal system: Abdomen is nondistended, soft and nontender. No organomegaly or masses felt. Normal bowel sounds heard. Central nervous system: Alert and oriented. No focal neurological deficits. Extremities: Symmetric 5 x 5 power. Skin: No rashes, lesions or ulcers Psychiatry: Judgement and insight appear normal. Mood & affect appropriate.   Right leg: She has chronic, thick scaly and discolored skin.  Left leg AKA stump , clean and dry. No edema.  Data Reviewed: I have personally reviewed following labs and imaging studies  CBC: Recent Labs  Lab 02/11/19 0318 02/13/19 0527 02/13/19 0804 02/13/19 0811 02/13/19 1047 02/15/19 0356 02/16/19 0546  WBC 8.3 9.9  --   --  10.8* 11.2* 12.4*  NEUTROABS  --   --   --   --   --   7.2 8.1*  HGB 9.2* 9.5* 10.2*  10.2* 9.9* 10.4* 9.7* 9.2*  HCT 28.8* 29.2* 30.0*  30.0* 29.0* 32.9* 30.6* 30.2*  MCV 93.2 93.0  --   --  93.2 95.0 97.4  PLT 267 389  --   --  288 280 [id number]   Basic Metabolic Panel: Recent Labs  Lab 02/11/19 0318 02/12/19 0805 02/13/19 0427 02/13/19 0804 02/13/19 0811 02/13/19 1047 02/14/19 0524 02/15/19 0356 02/16/19 0546  NA 141 141 141 144  143 144  --  141 144 141  K 4.7 4.3 4.1 4.1  4.2 4.1  --  3.7 3.7 3.8  CL 113* 107 106  --   --   --  103 108 106  CO2 18* 21* 22  --   --   --  23 22 22   GLUCOSE 78 84 88  --   --   --  105* 92 99  BUN 77* 79* 84*  --   --   --  91* 90* 92*  CREATININE 3.79* 4.01* 4.10*  --   --  4.24* 4.47* 4.06* 4.38*  CALCIUM 7.7* 7.8* 7.9*  --   --   --  7.9* 7.8* 8.0*  MG 1.9 1.7 1.6*  --   --   --  1.5* 1.4*  --    GFR: Estimated Creatinine Clearance: 12.9 mL/min (A) (by C-G formula based on SCr of 4.38 mg/dL (H)). Liver Function Tests: No results for input(s): AST, ALT, ALKPHOS, BILITOT, PROT, ALBUMIN in the last 168 hours. No results for input(s): LIPASE, AMYLASE in the last 168 hours. No results for input(s): AMMONIA in the last 168 hours. Coagulation Profile: No results for input(s): INR, PROTIME in the last 168 hours. Cardiac Enzymes: No results for input(s): CKTOTAL, CKMB, CKMBINDEX, TROPONINI in the last 168 hours. BNP (last 3 results) No results for input(s): PROBNP in the last 8760 hours. HbA1C: No results for input(s): HGBA1C in the last 72 hours. CBG: No results for input(s): GLUCAP in the last 168 hours. Lipid Profile: No results for input(s): CHOL, HDL, LDLCALC, TRIG, CHOLHDL, LDLDIRECT in the last 72 hours. Thyroid Function Tests: No results for input(s): TSH, T4TOTAL, FREET4, T3FREE, THYROIDAB in the last 72 hours. Anemia Panel: No results for input(s): VITAMINB12, FOLATE, FERRITIN, TIBC, IRON, RETICCTPCT in the last 72 hours. Sepsis Labs: No results for input(s): PROCALCITON,  LATICACIDVEN in the last 168 hours.  Recent Results (from the past 240 hour(s))  SARS CORONAVIRUS 2 (TAT 6-24 HRS) Nasopharyngeal Nasopharyngeal Swab     Status: None   Collection Time: 02/12/19  9:55 PM   Specimen: Nasopharyngeal Swab  Result Value Ref Range Status   SARS Coronavirus 2 NEGATIVE NEGATIVE Final    Comment: (NOTE) SARS-CoV-2 target nucleic acids are NOT DETECTED. The SARS-CoV-2 RNA is generally detectable in upper and lower respiratory specimens during the acute phase of infection. Negative results do not preclude SARS-CoV-2 infection, do not rule out co-infections with other pathogens, and should not be used as the sole basis for treatment or other patient management decisions. Negative results must be combined with clinical observations, patient history, and epidemiological information.  The expected result is Negative. Fact Sheet for Patients: SugarRoll.be Fact Sheet for Healthcare Providers: https://www.woods-mathews.com/ This test is not yet approved or cleared by the [place] FDA and  has been authorized for detection and/or diagnosis of SARS-CoV-2 by FDA under an Emergency Use Authorization (EUA). This EUA will remain  in effect (meaning this test can be used) for the duration of the COVID-19 declaration under Section 56 4(b)(1) of the Act, 21 U.S.C. section 360bbb-3(b)(1), unless the authorization is terminated or revoked sooner. Performed at [facility], [place] [address], [place], [place] [address]          Radiology Studies: No results found.      Scheduled Meds: . amLODipine  10 mg Oral Daily  . aspirin  81 mg Oral Daily  . atorvastatin  40 mg Oral q1800  . enoxaparin (LOVENOX) injection  30 mg Subcutaneous Q24H  . gabapentin  100 mg Oral BID  . influenza vaccine adjuvanted  0.5 mL Intramuscular Tomorrow-1000  . lubriderm seriously sensitive   Topical TID  . mouth rinse  15 mL Mouth Rinse  BID  . metoprolol tartrate  50 mg Oral BID  . sildenafil  20 mg Oral TID  . sodium bicarbonate  650 mg Oral TID  . sodium chloride flush  3 mL Intravenous Q12H  . sodium chloride flush  3 mL Intravenous Q12H   Continuous Infusions: . sodium chloride Stopped (02/12/19 2150)  . sodium chloride 10 mL/hr at 02/13/19 0614  . sodium chloride       LOS: 10 days    Time spent: 25 minutes     Barb Merino, MD [facility] Pager [phone]

## 2019-02-16 NOTE — Plan of Care (Signed)
  Problem: Education: Goal: Ability to demonstrate management of disease process will improve Outcome: Progressing Goal: Ability to verbalize understanding of medication therapies will improve Outcome: Progressing Goal: Individualized Educational Video(s) Outcome: Progressing   Problem: Activity: Goal: Capacity to carry out activities will improve Description: With left AKA and weakness homebound with 7 daughters that help her Outcome: Progressing   Problem: Cardiac: Goal: Ability to achieve and maintain adequate cardiopulmonary perfusion will improve Outcome: Progressing   Problem: Health Behavior/Discharge Planning: Goal: Ability to manage health-related needs will improve Outcome: Progressing   Problem: Clinical Measurements: Goal: Ability to maintain clinical measurements within normal limits will improve Outcome: Progressing Goal: Will remain free from infection Outcome: Progressing Goal: Diagnostic test results will improve Outcome: Progressing Goal: Cardiovascular complication will be avoided Outcome: Progressing   Problem: Activity: Goal: Risk for activity intolerance will decrease Outcome: Progressing   Problem: Nutrition: Goal: Adequate nutrition will be maintained Outcome: Progressing   Problem: Coping: Goal: Level of anxiety will decrease Outcome: Progressing   Problem: Elimination: Goal: Will not experience complications related to bowel motility Outcome: Progressing Goal: Will not experience complications related to urinary retention Outcome: Progressing   Problem: Pain Managment: Goal: General experience of comfort will improve Outcome: Progressing   Problem: Skin Integrity: Goal: Risk for impaired skin integrity will decrease Outcome: Progressing   Problem: Education: Goal: Understanding of CV disease, CV risk reduction, and recovery process will improve Outcome: Progressing   Problem: Activity: Goal: Ability to return to baseline activity  level will improve Outcome: Progressing   Problem: Cardiovascular: Goal: Ability to achieve and maintain adequate cardiovascular perfusion will improve Outcome: Progressing Goal: Vascular access site(s) Level 0-1 will be maintained Outcome: Progressing   Problem: Health Behavior/Discharge Planning: Goal: Ability to safely manage health-related needs after discharge will improve Outcome: Progressing   Problem: Education: Goal: Knowledge of General Education information will improve Description: Including pain rating scale, medication(s)/side effects and non-pharmacologic comfort measures Outcome: Progressing   Problem: Health Behavior/Discharge Planning: Goal: Ability to manage health-related needs will improve Outcome: Progressing   Problem: Clinical Measurements: Goal: Ability to maintain clinical measurements within normal limits will improve Outcome: Progressing Goal: Will remain free from infection Outcome: Progressing Goal: Diagnostic test results will improve Outcome: Progressing Goal: Respiratory complications will improve Outcome: Progressing Goal: Cardiovascular complication will be avoided Outcome: Progressing   Problem: Activity: Goal: Risk for activity intolerance will decrease Outcome: Progressing   Problem: Nutrition: Goal: Adequate nutrition will be maintained Outcome: Progressing   Problem: Coping: Goal: Level of anxiety will decrease Outcome: Progressing   Problem: Elimination: Goal: Will not experience complications related to bowel motility Outcome: Progressing Goal: Will not experience complications related to urinary retention Outcome: Progressing   Problem: Pain Managment: Goal: General experience of comfort will improve Outcome: Progressing   Problem: Safety: Goal: Ability to remain free from injury will improve Outcome: Progressing   Problem: Skin Integrity: Goal: Risk for impaired skin integrity will decrease Outcome:  Progressing

## 2019-02-16 NOTE — TOC Progression Note (Signed)
Transition of Care [facility]) - Progression Note    Patient Details  Name: Stephanie Griffith MRN: [id number] Date of Birth: March 19, 1946  Transition of Care [name] CM/SW Contact  Zenon Mayo, RN Phone Number: 02/16/2019, 4:29 PM  Clinical Narrative:    From home with daughter, CHF, has prosthesis is at home, cont to diurese. NCM offered choice, she states she has no preference.  NCM made referral to [facility] with [facility] for [facility] , he states he is able to take referral. Soc will begin 24 to 48 hrs post dc. Patient was observation and changed to inpatient.     Barriers to Discharge: No Barriers Identified  Expected Discharge Plan and Services                           DME Arranged: (NA)         HH Arranged: RN, Disease Management [place] Agency: [place] Date [facility] Agency Contacted: 02/09/19 Time [place]: [id number] Representative spoke with at [place]: [place] ([place]) Interventions    Readmission Risk Interventions No flowsheet data found.

## 2019-02-16 NOTE — Progress Notes (Signed)
[facility] Progress Note  Subjective:   Cr up again to 4.2 today off diuretics.    Vitals:   02/15/19 2037 02/16/19 0645 02/16/19 0720 02/16/19 1213  BP: 128/62 (!) 133/58 (!) 117/94 (!) 111/53  Pulse: 86 82 82 82  Resp: 18 18  18   Temp: 98.9 F (37.2 C) 99 F (37.2 C) 98.7 F (37.1 C) 98 F (36.7 C)  TempSrc: Oral Oral Oral Oral  SpO2: 96% 93% 92% 93%  Weight:  83.5 kg    Height:        Inpatient medications: . amLODipine  10 mg Oral Daily  . aspirin  81 mg Oral Daily  . atorvastatin  40 mg Oral q1800  . enoxaparin (LOVENOX) injection  30 mg Subcutaneous Q24H  . gabapentin  100 mg Oral BID  . influenza vaccine adjuvanted  0.5 mL Intramuscular Tomorrow-1000  . lubriderm seriously sensitive   Topical TID  . mouth rinse  15 mL Mouth Rinse BID  . metoprolol tartrate  50 mg Oral BID  . sildenafil  20 mg Oral TID  . sodium bicarbonate  650 mg Oral TID  . sodium chloride flush  3 mL Intravenous Q12H  . sodium chloride flush  3 mL Intravenous Q12H   . sodium chloride Stopped (02/12/19 2150)  . sodium chloride 10 mL/hr at 02/13/19 0614  . sodium chloride     sodium chloride, sodium chloride, acetaminophen, hydrALAZINE, ondansetron (ZOFRAN) IV, polyethylene glycol, senna-docusate, sodium chloride flush, sodium chloride flush, zolpidem    Exam: Gen  NAD, pleasant, on nasal cannula +JVD Chest mostly clear, occ rales , no wheezing, no ^wob RRR no MRG Soft, nontender GU purewick draining pale yellow urine Left AKA, right lower extremity with cobblestoning Neuro is alert, Ox 3 , nf    Home meds:  - amlodipine 10/ metoprolol 50 bid  - gabapentin 100 bid  - aspirin 81      Date             Creat               eGFR  2009- 10         1.93- 3.11  2013               1.46- 2.97          2017               1.66- 2.79        16- 30  Feb 2019        2.06                 23  02/05/2019       3.17                 16, stage IV  02/07/2019       3.36                  15, stage IV-V    Na 145 K 5.0  CO2 13  BUN 64  Cr 3.36  Mg 1.0 Ca 6.9  Alb 2.4   WBC 10k  Hb 7.5  plt 293   CXR 11/5 > Cardiomegaly and pulmonary vascular congestion without overt edema  ECHO - LVEF 60%, RV pressure/ vol overload, RV slightly reduced fxn. RA severely dilated, severe ^RV sys 173m Hg. IVC dilated w/ low resp variability suggesting RA pressure 146mHg.   Assessment/ Plan: 1. Renal failure -  since 2013 - Feb 2019 patient has had mostly CKD IV disease w/ creat 1.6- 2.5.  Favor progressive CKD.  No uremic symptoms.    Some loss of GFR with diuresis, after RHC diuretics stopped on 11/13, Cont to hold diuretics today, will add back for tomorrow.   2. Pulmonary hypertension, RHC 11/13, started sildenafil, per cardiology 3. HTN - cont meds BP's reasonably well controlled 4. Met acidosis - po bicarb started, stable >20 5. SP L AKA 6. Obesity 7. COPD 8. Anemia, transfuse as indicated 9. Hypomagnesemia, Diuretic/diet related, replete aggressively  Stephanie Griffith  02/16/2019, 12:58 PM  Iron/TIBC/Ferritin/ %Sat    Component Value Date/Time   IRON 27 (L) 02/08/2019 0401   TIBC 165 (L) 02/08/2019 0401   FERRITIN 73 02/08/2019 0401   IRONPCTSAT 16 02/08/2019 0401   Recent Labs  Lab 02/16/19 0546  NA 141  K 3.8  CL 106  CO2 22  GLUCOSE 99  BUN 92*  CREATININE 4.38*  CALCIUM 8.0*   No results for input(s): AST, ALT, ALKPHOS, BILITOT, PROT in the last 168 hours. Recent Labs  Lab 02/16/19 0546  WBC 12.4*  HGB 9.2*  HCT 30.2*  PLT 279

## 2019-02-16 NOTE — Care Management Important Message (Signed)
Important Message  Patient Details  Name: Stephanie Griffith MRN: [id number] Date of Birth: 09/22/1945   Medicare Important Message Given:  Yes     Shelda Altes 02/16/2019, 1:06 PM

## 2019-02-17 LAB — BASIC METABOLIC PANEL
Anion gap: 16 — ABNORMAL HIGH (ref 5–15)
BUN: 100 mg/dL — ABNORMAL HIGH (ref 8–23)
CO2: 19 mmol/L — ABNORMAL LOW (ref 22–32)
Calcium: 7.9 mg/dL — ABNORMAL LOW (ref 8.9–10.3)
Chloride: 107 mmol/L (ref 98–111)
Creatinine, Ser: 4.58 mg/dL — ABNORMAL HIGH (ref 0.44–1.00)
GFR calc Af Amer: 10 mL/min — ABNORMAL LOW (ref >60)
GFR calc non Af Amer: 9 mL/min — ABNORMAL LOW (ref >60)
Glucose, Bld: 95 mg/dL (ref 70–99)
Potassium: 3.8 mmol/L (ref 3.5–5.1)
Sodium: 142 mmol/L (ref 135–145)

## 2019-02-17 LAB — CBC WITH DIFFERENTIAL/PLATELET
Abs Immature Granulocytes: 0.16 10*3/uL — ABNORMAL HIGH (ref 0.00–0.07)
Basophils Absolute: 0.1 10*3/uL (ref 0.0–0.1)
Basophils Relative: 1 %
Eosinophils Absolute: 0.5 10*3/uL (ref 0.0–0.5)
Eosinophils Relative: 4 %
HCT: 28.7 % — ABNORMAL LOW (ref 36.0–46.0)
Hemoglobin: 8.8 g/dL — ABNORMAL LOW (ref 12.0–15.0)
Immature Granulocytes: 1 %
Lymphocytes Relative: 15 %
Lymphs Abs: 1.7 10*3/uL (ref 0.7–4.0)
MCH: 29.8 pg (ref 26.0–34.0)
MCHC: 30.7 g/dL (ref 30.0–36.0)
MCV: 97.3 fL (ref 80.0–100.0)
Monocytes Absolute: 1.7 10*3/uL — ABNORMAL HIGH (ref 0.1–1.0)
Monocytes Relative: 14 %
Neutro Abs: 7.7 10*3/uL (ref 1.7–7.7)
Neutrophils Relative %: 65 %
Platelets: 287 10*3/uL (ref 150–400)
RBC: 2.95 MIL/uL — ABNORMAL LOW (ref 3.87–5.11)
RDW: 16 % — ABNORMAL HIGH (ref 11.5–15.5)
WBC: 11.8 10*3/uL — ABNORMAL HIGH (ref 4.0–10.5)
nRBC: 0 % (ref 0.0–0.2)

## 2019-02-17 LAB — URINALYSIS, ROUTINE W REFLEX MICROSCOPIC
Bilirubin Urine: NEGATIVE
Glucose, UA: NEGATIVE mg/dL
Ketones, ur: NEGATIVE mg/dL
Nitrite: NEGATIVE
Protein, ur: 100 mg/dL — AB
Specific Gravity, Urine: 1.012 (ref 1.005–1.030)
pH: 6 (ref 5.0–8.0)

## 2019-02-17 MED ORDER — FUROSEMIDE 40 MG PO TABS
40.0000 mg | ORAL_TABLET | Freq: Every day | ORAL | Status: DC
  Administered 2019-02-18: 40 mg via ORAL
  Filled 2019-02-17: qty 1

## 2019-02-17 NOTE — Progress Notes (Signed)
PROGRESS NOTE    Stephanie Griffith  [id number] DOB: 06/24/1945 DOA: 02/05/2019 PCP: [name], MD    Brief Narrative:  73 y.o.femalewith medical history significant forchronic kidney disease stage IV, peripheral vascular disease status post left AKA, hypertension, chronic anemia, and chronic diastolic CHF, presented to the emergency department with 1 week of progressive leg swelling and shortness of breath.  Found to be in CHF exacerbation complicated by CKD. She was diuresed , renal functions exacerbated. [place] 11/13 with moderate to severe PAH, normal EF Is staying in the hospital mainly due to fluctuating renal functions. Care as per nephrology.  Assessment & Plan:   Principal Problem:   Acute on chronic diastolic CHF (congestive heart failure) (HCC) Active Problems:   Normocytic anemia   Essential hypertension, benign   Hyperkalemia   Chronic renal insufficiency, stage IV (severe) (HCC)   Metabolic acidosis  Acute on chronic congestive heart failure with preserved ejection fraction: Treated with aggressive diuresis, clinically improving, on room air now. Right heart cath with normal ejection fraction, shows moderate to severe pulmonary hypertension. Intake and output monitoring.  Monitor electrolytes.  Moderate to severe pulmonary hypertension: Diagnosed with right heart cath. Started on sildenafil.  Will need outpatient follow-up and monitoring.  Acute kidney injury on chronic kidney disease stage IV: Baseline creatinine reported to.  Admission creatinine was 3.1.  Trended up and fluctuating. 4.58 today. Followed by nephrology.  Currently no evidence of uremia.  Potassium is normal.  Hypertension: Blood pressure stable. On amiodarone, hydralazine and beta-blockers.  Anemia of chronic disease: Baseline hemoglobin 8.  Received 1 unit of PRBC on 11/10.  Hemoglobin is fairly stable.   DVT prophylaxis: Lovenox subcu Code Status: Full code Family Communication: None  Disposition Plan: Still needing hospitalization.  Her care is mainly driven by nephrology.  Consultants:   Cardiology  Nephrology  Procedures:   Cardiac cath, right heart cath 11/13  Antimicrobials:   None   Subjective: Patient seen and examined.  No overnight events.  On room air.    Objective: Vitals:   02/16/19 1933 02/17/19 0512 02/17/19 0533 02/17/19 0909  BP: (!) 132/56 134/64  (!) 141/55  Pulse: 92 78  83  Resp: 17 18    Temp: 99.1 F (37.3 C) 97.8 F (36.6 C)    TempSrc: Oral Oral    SpO2: 96% 97%    Weight:   83.5 kg   Height:        Intake/Output Summary (Last 24 hours) at 02/17/2019 1037 Last data filed at 02/17/2019 1033 Gross per 24 hour  Intake 1062 ml  Output 1101 ml  Net -39 ml   Filed Weights   02/15/19 0602 02/16/19 0645 02/17/19 0533  Weight: 84.5 kg 83.5 kg 83.5 kg    Examination:  General exam: Appears calm and comfortable , she is currently comfortable on room air.  Respiratory system: Clear to auscultation. Respiratory effort normal.   Cardiovascular system: S1 & S2 heard, RRR. No JVD, murmurs, rubs, gallops or clicks.  Gastrointestinal system: Abdomen is nondistended, soft and nontender. No organomegaly or masses felt. Normal bowel sounds heard. Central nervous system: Alert and oriented. No focal neurological deficits. Extremities: Symmetric 5 x 5 power. Skin: No rashes, lesions or ulcers Psychiatry: Judgement and insight appear normal. Mood & affect appropriate.   Right leg: She has chronic, thick scaly and discolored skin.  Left leg AKA stump , clean and dry. No edema.     Data Reviewed: I have personally reviewed following  labs and imaging studies  CBC: Recent Labs  Lab 02/13/19 0527  02/13/19 0811 02/13/19 1047 02/15/19 0356 02/16/19 0546 02/17/19 0319  WBC 9.9  --   --  10.8* 11.2* 12.4* 11.8*  NEUTROABS  --   --   --   --  7.2 8.1* 7.7  HGB 9.5*   < > 9.9* 10.4* 9.7* 9.2* 8.8*  HCT 29.2*   < > 29.0* 32.9*  30.6* 30.2* 28.7*  MCV 93.0  --   --  93.2 95.0 97.4 97.3  PLT 389  --   --  288 280 279 287   < > = values in this interval not displayed.   Basic Metabolic Panel: Recent Labs  Lab 02/11/19 0318 02/12/19 0805 02/13/19 0427  02/13/19 0811 02/13/19 1047 02/14/19 0524 02/15/19 0356 02/16/19 0546 02/17/19 0319  NA 141 141 141   < > 144  --  141 144 141 142  K 4.7 4.3 4.1   < > 4.1  --  3.7 3.7 3.8 3.8  CL 113* 107 106  --   --   --  103 108 106 107  CO2 18* 21* 22  --   --   --  23 22 22  19*  GLUCOSE 78 84 88  --   --   --  105* 92 99 95  BUN 77* 79* 84*  --   --   --  91* 90* 92* 100*  CREATININE 3.79* 4.01* 4.10*  --   --  4.24* 4.47* 4.06* 4.38* 4.58*  CALCIUM 7.7* 7.8* 7.9*  --   --   --  7.9* 7.8* 8.0* 7.9*  MG 1.9 1.7 1.6*  --   --   --  1.5* 1.4*  --   --    < > = values in this interval not displayed.   GFR: Estimated Creatinine Clearance: 12.4 mL/min (A) (by C-G formula based on SCr of 4.58 mg/dL (H)). Liver Function Tests: No results for input(s): AST, ALT, ALKPHOS, BILITOT, PROT, ALBUMIN in the last 168 hours. No results for input(s): LIPASE, AMYLASE in the last 168 hours. No results for input(s): AMMONIA in the last 168 hours. Coagulation Profile: No results for input(s): INR, PROTIME in the last 168 hours. Cardiac Enzymes: No results for input(s): CKTOTAL, CKMB, CKMBINDEX, TROPONINI in the last 168 hours. BNP (last 3 results) No results for input(s): PROBNP in the last 8760 hours. HbA1C: No results for input(s): HGBA1C in the last 72 hours. CBG: No results for input(s): GLUCAP in the last 168 hours. Lipid Profile: No results for input(s): CHOL, HDL, LDLCALC, TRIG, CHOLHDL, LDLDIRECT in the last 72 hours. Thyroid Function Tests: No results for input(s): TSH, T4TOTAL, FREET4, T3FREE, THYROIDAB in the last 72 hours. Anemia Panel: No results for input(s): VITAMINB12, FOLATE, FERRITIN, TIBC, IRON, RETICCTPCT in the last 72 hours. Sepsis Labs: No results for  input(s): PROCALCITON, LATICACIDVEN in the last 168 hours.  Recent Results (from the past 240 hour(s))  SARS CORONAVIRUS 2 (TAT 6-24 HRS) Nasopharyngeal Nasopharyngeal Swab     Status: None   Collection Time: 02/12/19  9:55 PM   Specimen: Nasopharyngeal Swab  Result Value Ref Range Status   SARS Coronavirus 2 NEGATIVE NEGATIVE Final    Comment: (NOTE) SARS-CoV-2 target nucleic acids are NOT DETECTED. The SARS-CoV-2 RNA is generally detectable in upper and lower respiratory specimens during the acute phase of infection. Negative results do not preclude SARS-CoV-2 infection, do not rule out co-infections with other pathogens, and  should not be used as the sole basis for treatment or other patient management decisions. Negative results must be combined with clinical observations, patient history, and epidemiological information. The expected result is Negative. Fact Sheet for Patients: SugarRoll.be Fact Sheet for Healthcare Providers: https://www.woods-mathews.com/ This test is not yet approved or cleared by the [place] FDA and  has been authorized for detection and/or diagnosis of SARS-CoV-2 by FDA under an Emergency Use Authorization (EUA). This EUA will remain  in effect (meaning this test can be used) for the duration of the COVID-19 declaration under Section 56 4(b)(1) of the Act, 21 U.S.C. section 360bbb-3(b)(1), unless the authorization is terminated or revoked sooner. Performed at [facility], [place] [address], [place], [place] [address]          Radiology Studies: No results found.      Scheduled Meds: . amLODipine  10 mg Oral Daily  . aspirin  81 mg Oral Daily  . atorvastatin  40 mg Oral q1800  . enoxaparin (LOVENOX) injection  30 mg Subcutaneous Q24H  . furosemide  40 mg Oral BID  . gabapentin  100 mg Oral BID  . influenza vaccine adjuvanted  0.5 mL Intramuscular Tomorrow-1000  . lubriderm seriously  sensitive   Topical TID  . mouth rinse  15 mL Mouth Rinse BID  . metoprolol tartrate  50 mg Oral BID  . sildenafil  20 mg Oral TID  . sodium bicarbonate  650 mg Oral TID  . sodium chloride flush  3 mL Intravenous Q12H  . sodium chloride flush  3 mL Intravenous Q12H   Continuous Infusions: . sodium chloride Stopped (02/12/19 2150)  . sodium chloride 10 mL/hr at 02/13/19 0614  . sodium chloride       LOS: 11 days    Time spent: 25 minutes     Barb Merino, MD [facility] Pager [phone]

## 2019-02-17 NOTE — Progress Notes (Signed)
[facility] Progress Note    Assessment/ Plan:   1. Renal failure - since 2013 - Feb 2019 patient has had mostly CKD IV disease w/ creat 1.6- 2.5. Favor progressive CKD.  No uremic symptoms.   Some loss of GFR with diuresis, after RHC diuretics stopped on 11/13.  Low-dose Lasix added back today 11/17 at 40 mg PO daily.  If stable on regimen tomorrow can d/c with close followup.  I suspect cardiorenal syndrome and likely will have fluctuating Cr based on vol status.     2. 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 HTN - cont meds BP's reasonably well controlled 4. Met acidosis - po bicarb started, stable >20 5. SP L AKA 6. Obesity 7. COPD 8. Anemia, transfuse as indicated 9. Hypomagnesemia, Diuretic/diet related, replete aggressively  Subjective:    Cr up and down, PO Lasix started this AM.     Objective:   BP (!) 141/55 (BP Location: Left Arm)   Pulse 83   Temp 97.8 F (36.6 C) (Oral)   Resp 18   Ht 5' 8"  (1.727 m)   Wt 83.5 kg   SpO2 97%   BMI 27.99 kg/m   Intake/Output Summary (Last 24 hours) at 02/17/2019 1126 Last data filed at 02/17/2019 1033 Gross per 24 hour  Intake 1062 ml  Output 1101 ml  Net -39 ml   Weight change: 0 kg  Physical Exam: Gen NAD, on RA HEENT +JVD PULM: clear anteriorly bilaterally, decreased in bases RRR no MRG Soft, nontender GUpurewick draining pale yellow urine Left AKA, right lower extremity with cobblestoning Neuro AAO x 3, nonfocal  Imaging: No results found.  Labs: BMET Recent Labs  Lab 02/11/19 0318 02/12/19 0805 02/13/19 0427 02/13/19 0804 02/13/19 0811 02/13/19 1047 02/14/19 0524 02/15/19 0356 02/16/19 0546 02/17/19 0319  NA 141 141 141 144  143 144  --  141 144 141 142  K 4.7 4.3 4.1 4.1  4.2 4.1  --  3.7 3.7 3.8 3.8  CL 113* 107 106  --   --   --  103 108 106 107  CO2 18* 21* 22  --   --   --  23 22 22  19*  GLUCOSE 78 84 88  --   --   --   105* 92 99 95  BUN 77* 79* 84*  --   --   --  91* 90* 92* 100*  CREATININE 3.79* 4.01* 4.10*  --   --  4.24* 4.47* 4.06* 4.38* 4.58*  CALCIUM 7.7* 7.8* 7.9*  --   --   --  7.9* 7.8* 8.0* 7.9*   CBC Recent Labs  Lab 02/13/19 1047 02/15/19 0356 02/16/19 0546 02/17/19 0319  WBC 10.8* 11.2* 12.4* 11.8*  NEUTROABS  --  7.2 8.1* 7.7  HGB 10.4* 9.7* 9.2* 8.8*  HCT 32.9* 30.6* 30.2* 28.7*  MCV 93.2 95.0 97.4 97.3  PLT 288 280 279 287    Medications:    . amLODipine  10 mg Oral Daily  . aspirin  81 mg Oral Daily  . atorvastatin  40 mg Oral q1800  . enoxaparin (LOVENOX) injection  30 mg Subcutaneous Q24H  . furosemide  40 mg Oral BID  . gabapentin  100 mg Oral BID  . influenza vaccine adjuvanted  0.5 mL Intramuscular Tomorrow-1000  . lubriderm seriously sensitive   Topical TID  . mouth rinse  15 mL Mouth Rinse BID  . metoprolol tartrate  50 mg Oral BID  . sildenafil  20 mg Oral TID  . sodium bicarbonate  650 mg Oral TID  . sodium chloride flush  3 mL Intravenous Q12H  . sodium chloride flush  3 mL Intravenous Q12H      Madelon Lips, MD 02/17/2019, 11:26 AM

## 2019-02-18 LAB — CBC
HCT: 28.8 % — ABNORMAL LOW (ref 36.0–46.0)
Hemoglobin: 9 g/dL — ABNORMAL LOW (ref 12.0–15.0)
MCH: 30 pg (ref 26.0–34.0)
MCHC: 31.3 g/dL (ref 30.0–36.0)
MCV: 96 fL (ref 80.0–100.0)
Platelets: 281 10*3/uL (ref 150–400)
RBC: 3 MIL/uL — ABNORMAL LOW (ref 3.87–5.11)
RDW: 15.9 % — ABNORMAL HIGH (ref 11.5–15.5)
WBC: 11.1 10*3/uL — ABNORMAL HIGH (ref 4.0–10.5)
nRBC: 0 % (ref 0.0–0.2)

## 2019-02-18 LAB — BASIC METABOLIC PANEL
Anion gap: 13 (ref 5–15)
BUN: 98 mg/dL — ABNORMAL HIGH (ref 8–23)
CO2: 21 mmol/L — ABNORMAL LOW (ref 22–32)
Calcium: 7.8 mg/dL — ABNORMAL LOW (ref 8.9–10.3)
Chloride: 107 mmol/L (ref 98–111)
Creatinine, Ser: 4.28 mg/dL — ABNORMAL HIGH (ref 0.44–1.00)
GFR calc Af Amer: 11 mL/min — ABNORMAL LOW (ref >60)
GFR calc non Af Amer: 10 mL/min — ABNORMAL LOW (ref >60)
Glucose, Bld: 132 mg/dL — ABNORMAL HIGH (ref 70–99)
Potassium: 4 mmol/L (ref 3.5–5.1)
Sodium: 141 mmol/L (ref 135–145)

## 2019-02-18 MED ORDER — SILDENAFIL CITRATE 20 MG PO TABS: 20.0000 mg | ORAL_TABLET | Freq: Three times a day (TID) | ORAL | 0 refills | Status: AC

## 2019-02-18 MED ORDER — ATORVASTATIN CALCIUM 40 MG PO TABS: 40.0000 mg | ORAL_TABLET | Freq: Every day | ORAL | 0 refills | Status: AC

## 2019-02-18 MED ORDER — SODIUM BICARBONATE 650 MG PO TABS: 650.0000 mg | ORAL_TABLET | Freq: Three times a day (TID) | ORAL | 0 refills | Status: AC

## 2019-02-18 MED ORDER — FUROSEMIDE 40 MG PO TABS: 40.0000 mg | ORAL_TABLET | Freq: Every day | ORAL | 0 refills | Status: AC

## 2019-02-18 NOTE — Discharge Summary (Signed)
Physician Discharge Summary  Stephanie Griffith [id number] DOB: [birth year] DOA: 02/05/2019  PCP: [name], MD  Admit date: 02/05/2019 Discharge date: 02/18/2019  Admitted From: home  Disposition:  Home   Recommendations for Outpatient Follow-up:  1. Follow up with PCP in 1-2 weeks 2. Please obtain BMP/CBC in one week 3. Please follow up with Nephrology, they will call for follow up.  Home Health:HHRN  Equipment/Devices: Patient has existing devices at home including left leg prosthesis.  Discharge Condition: Stable CODE STATUS: Full code Diet recommendation: Low-sodium diet.  2 g.  32 ounce fluid intake.  Discharge summary: 73 year old female with history of chronic kidney disease stage IV, peripheral vascular disease status post left above-knee amputation, hypertension, chronic anemia and chronic diastolic dysfunction presented to the emergency room with 1 week of progressive leg swelling and shortness of breath.  She was found to have chronic diastolic CHF exacerbation and was treated with diuresis.  She was seen and followed by nephrology and cardiology.  Patient was also found to have moderate to severe pulmonary arterial hypertension with normal ejection fraction.  She developed cardiorenal syndrome and slowly stabilizing.  Treated for following conditions.  Acute on chronic congestive heart failure with preserved ejection fraction: Treated with aggressive diuresis.  Then given diuretic holiday.  Stabilizing.  Euvolemic.  Started on diuretics with Lasix 40 mg daily on discharge.  Patient will have outpatient follow-up with nephrology.  Patient underwent right heart cath on 11/13 with moderate to severe pulmonary arterial hypertension.  Patient has a stabilized now.  She is on room air.  Started on sildenafil.  Patient will have outpatient follow-up with cardiology for pulmonary arterial hypertension therapies.  Patient was also found with hemoglobin of less than 7, she received 1 unit  of PRBC during hospitalization with stabilization and improvement.  Renal function fluctuated, however remained stable, discharging home with close follow-up with nephrology.    Discharge Diagnoses:  Principal Problem:   Acute on chronic diastolic CHF (congestive heart failure) (HCC) Active Problems:   Normocytic anemia   Essential hypertension, benign   Hyperkalemia   Chronic renal insufficiency, stage IV (severe) (HCC)   Metabolic acidosis    Discharge Instructions  Discharge Instructions    Diet - low sodium heart healthy   Complete by: As directed    Increase activity slowly   Complete by: As directed    32 oz fluid per day. Low salt 2 gm total sodium a day.     Allergies as of 02/18/2019      Reactions   Morphine And Related    AMS      Medication List    TAKE these medications   amLODipine 10 MG tablet Commonly known as: NORVASC Take 10 mg by mouth daily.   aspirin 81 MG EC tablet Take 81 mg by mouth daily.   atorvastatin 40 MG tablet Commonly known as: LIPITOR Take 1 tablet (40 mg total) by mouth daily at 6 PM.   furosemide 40 MG tablet Commonly known as: LASIX Take 1 tablet (40 mg total) by mouth daily. Start taking on: February 19, 2019   gabapentin 100 MG capsule Commonly known as: NEURONTIN Take 100 mg by mouth 2 (two) times daily.   metoprolol tartrate 50 MG tablet Commonly known as: LOPRESSOR Take 1 tablet (50 mg total) by mouth 2 (two) times daily.   multivitamin with minerals Tabs tablet Take 1 tablet by mouth daily.   sildenafil 20 MG tablet Commonly known as: REVATIO Take  1 tablet (20 mg total) by mouth 3 (three) times daily.   sodium bicarbonate 650 MG tablet Take 1 tablet (650 mg total) by mouth 3 (three) times daily.      Follow-up Information    Bensimhon, Shaune Pascal, MD Follow up.   Specialty: Cardiology Why: the office will call you for appt, if you have not heard by Wed then call the office.  Contact information: [address] [place] [address] [phone]        [name], MD.   Specialty: Internal Medicine Contact information: [address] [place] [address] [phone]          Allergies  Allergen Reactions  . Morphine And Related     AMS    Consultations:  Cardiology  Nephrology   Procedures/Studies: Dg Chest 2 View  Result Date: 02/05/2019 CLINICAL DATA:  Shortness of breath. EXAM: CHEST - 2 VIEW COMPARISON:  Chest x-ray dated January 08, 2016. FINDINGS: Unchanged cardiomegaly. Pulmonary vascular congestion. No focal consolidation, pleural effusion, or pneumothorax. No acute osseous abnormality. IMPRESSION: Cardiomegaly and pulmonary vascular congestion without overt edema. Electronically Signed   By: Titus Dubin M.D.   On: 02/05/2019 19:33   US Renal  Result Date: 02/08/2019 CLINICAL DATA:  73 year old female with stage 4 chronic kidney disease. EXAM: RENAL / URINARY TRACT ULTRASOUND COMPLETE COMPARISON:  CT Abdomen and Pelvis 12/30/2015. FINDINGS: Right Kidney: Renal measurements: 8.3 x 4.5 x 4.2 centimeters = volume: 82 mL. Echogenic right kidney difficult to delineate (image 6). No right hydronephrosis or renal mass. Left Kidney: Renal measurements: Could not be identified, probably in part due to echogenic left renal cortex difficult to differentiate from the retroperitoneal fat. Bladder: Diminutive.  Appears normal for degree of bladder distention. IMPRESSION: 1. Echogenic right kidney compatible with chronic medical renal disease. Left kidney could not be identified, also probably due to chronic renal disease. 2. Diminutive and unremarkable urinary bladder. Electronically Signed   By: Genevie Ann M.D.   On: 02/08/2019 21:59   Vas [place] Lower Extremity Venous (dvt)  Result Date: 02/08/2019  Lower Venous Study Indications: Edema.  Limitations: Body habitus, poor ultrasound/tissue interface and skin texture. Comparison Study: No prior  study Performing Technologist: Sharion Dove RVS  Examination Guidelines: A complete evaluation includes B-mode imaging, spectral Doppler, color Doppler, and power Doppler as needed of all accessible portions of each vessel. Bilateral testing is considered an integral part of a complete examination. Limited examinations for reoccurring indications may be performed as noted.  Right Technical Findings: Right leg not evaluated.  +---------+---------------+---------+-----------+----------+-------------------+ LEFT     CompressibilityPhasicitySpontaneityPropertiesThrombus Aging      +---------+---------------+---------+-----------+----------+-------------------+ CFV                                                   pulsatile           +---------+---------------+---------+-----------+----------+-------------------+ FV Prox                 Yes      Yes                  patent by color and  Doppler             +---------+---------------+---------+-----------+----------+-------------------+ FV Mid                  Yes      Yes                  patent by color and                                                       Doppler             +---------+---------------+---------+-----------+----------+-------------------+ FV Distal               Yes      Yes                  patent by color and                                                       Doppler             +---------+---------------+---------+-----------+----------+-------------------+ PFV                                                   Not visualized      +---------+---------------+---------+-----------+----------+-------------------+ POP      Full                                         pulsatile           +---------+---------------+---------+-----------+----------+-------------------+ PTV                                                    Not visualized      +---------+---------------+---------+-----------+----------+-------------------+ PERO                                                  Not visualized      +---------+---------------+---------+-----------+----------+-------------------+   Left Technical Findings: Not visualized segments include profunda, posterior tibial, peroenal veins.   Summary: Left: There is no evidence of deep vein thrombosis in the lower extremity. However, portions of this examination were limited- see technologist comments above.  *See table(s) above for measurements and observations. Electronically signed by Curt Jews MD on 02/08/2019 at 9:03:45 AM.    Final        Subjective: Patient seen and examined.  No overnight events.  Remains on room air.  No chest pain, shortness of breath.  No cough.   Discharge Exam: Vitals:   02/18/19 1016 02/18/19 1203  BP: (!) 120/55 114/60  Pulse: 81 83  Resp: 18 19  Temp: 99.5 F (  37.5 C) 98.1 F (36.7 C)  SpO2: 100% 93%   Vitals:   02/18/19 0417 02/18/19 0505 02/18/19 1016 02/18/19 1203  BP: (!) 129/59  (!) 120/55 114/60  Pulse: 81  81 83  Resp: 17  18 19   Temp: 98.8 F (37.1 C)  99.5 F (37.5 C) 98.1 F (36.7 C)  TempSrc: Oral  Oral Oral  SpO2: 96%  100% 93%  Weight:  70.2 kg    Height:        General: Pt is alert, awake, not in acute distress, on room air. Cardiovascular: RRR, S1/S2 +, no rubs, no gallops Respiratory: CTA bilaterally, no wheezing, no rhonchi Abdominal: Soft, NT, ND, bowel sounds + Extremities:  Left above-knee amputation stump clean and dry. Right knee with extensive vascular changes, thickened scaly skin with fissures, no edema.  Overgrown unkempt toenails.    The results of significant diagnostics from this hospitalization (including imaging, microbiology, ancillary and laboratory) are listed below for reference.     Microbiology: Recent Results (from the past 240 hour(s))  SARS CORONAVIRUS 2 (TAT 6-24 HRS)  Nasopharyngeal Nasopharyngeal Swab     Status: None   Collection Time: 02/12/19  9:55 PM   Specimen: Nasopharyngeal Swab  Result Value Ref Range Status   SARS Coronavirus 2 NEGATIVE NEGATIVE Final    Comment: (NOTE) SARS-CoV-2 target nucleic acids are NOT DETECTED. The SARS-CoV-2 RNA is generally detectable in upper and lower respiratory specimens during the acute phase of infection. Negative results do not preclude SARS-CoV-2 infection, do not rule out co-infections with other pathogens, and should not be used as the sole basis for treatment or other patient management decisions. Negative results must be combined with clinical observations, patient history, and epidemiological information. The expected result is Negative. Fact Sheet for Patients: SugarRoll.be Fact Sheet for Healthcare Providers: https://www.woods-mathews.com/ This test is not yet approved or cleared by the [place] FDA and  has been authorized for detection and/or diagnosis of SARS-CoV-2 by FDA under an Emergency Use Authorization (EUA). This EUA will remain  in effect (meaning this test can be used) for the duration of the COVID-19 declaration under Section 56 4(b)(1) of the Act, 21 U.S.C. section 360bbb-3(b)(1), unless the authorization is terminated or revoked sooner. Performed at [facility], [place] [address] [place], [place] [address]      Labs: BNP (last 3 results) Recent Labs    02/06/19 0313  BNP [id number]   Basic Metabolic Panel: Recent Labs  Lab 02/12/19 0805 02/13/19 0427  02/14/19 0524 02/15/19 0356 02/16/19 0546 02/17/19 0319 02/18/19 0951  NA 141 141   < > 141 144 141 142 141  K 4.3 4.1   < > 3.7 3.7 3.8 3.8 4.0  CL 107 106  --  103 108 106 107 107  CO2 21* 22  --  23 22 22  19* 21*  GLUCOSE 84 88  --  105* 92 99 95 132*  BUN 79* 84*  --  91* 90* 92* 100* 98*  CREATININE 4.01* 4.10*   < > 4.47* 4.06* 4.38* 4.58* 4.28*  CALCIUM 7.8*  7.9*  --  7.9* 7.8* 8.0* 7.9* 7.8*  MG 1.7 1.6*  --  1.5* 1.4*  --   --   --    < > = values in this interval not displayed.   Liver Function Tests: No results for input(s): AST, ALT, ALKPHOS, BILITOT, PROT, ALBUMIN in the last 168 hours. No results for input(s): LIPASE, AMYLASE in the last  168 hours. No results for input(s): AMMONIA in the last 168 hours. CBC: Recent Labs  Lab 02/13/19 1047 02/15/19 0356 02/16/19 0546 02/17/19 0319 02/18/19 0951  WBC 10.8* 11.2* 12.4* 11.8* 11.1*  NEUTROABS  --  7.2 8.1* 7.7  --   HGB 10.4* 9.7* 9.2* 8.8* 9.0*  HCT 32.9* 30.6* 30.2* 28.7* 28.8*  MCV 93.2 95.0 97.4 97.3 96.0  PLT 288 280 279 287 281   Cardiac Enzymes: No results for input(s): CKTOTAL, CKMB, CKMBINDEX, TROPONINI in the last 168 hours. BNP: Invalid input(s): POCBNP CBG: No results for input(s): GLUCAP in the last 168 hours. D-Dimer No results for input(s): DDIMER in the last 72 hours. Hgb A1c No results for input(s): HGBA1C in the last 72 hours. Lipid Profile No results for input(s): CHOL, HDL, LDLCALC, TRIG, CHOLHDL, LDLDIRECT in the last 72 hours. Thyroid function studies No results for input(s): TSH, T4TOTAL, T3FREE, THYROIDAB in the last 72 hours.  Invalid input(s): FREET3 Anemia work up No results for input(s): VITAMINB12, FOLATE, FERRITIN, TIBC, IRON, RETICCTPCT in the last 72 hours. Urinalysis    Component Value Date/Time   COLORURINE YELLOW 02/17/2019 1843   APPEARANCEUR CLOUDY (A) 02/17/2019 1843   LABSPEC 1.012 02/17/2019 1843   PHURINE 6.0 02/17/2019 1843   GLUCOSEU NEGATIVE 02/17/2019 1843   HGBUR SMALL (A) 02/17/2019 1843   BILIRUBINUR NEGATIVE 02/17/2019 1843   KETONESUR NEGATIVE 02/17/2019 1843   PROTEINUR 100 (A) 02/17/2019 1843   UROBILINOGEN 0.2 06/18/2011 1751   NITRITE NEGATIVE 02/17/2019 1843   LEUKOCYTESUR TRACE (A) 02/17/2019 1843   Sepsis Labs Invalid input(s): PROCALCITONIN,  WBC,  LACTICIDVEN Microbiology Recent Results (from the  past 240 hour(s))  SARS CORONAVIRUS 2 (TAT 6-24 HRS) Nasopharyngeal Nasopharyngeal Swab     Status: None   Collection Time: 02/12/19  9:55 PM   Specimen: Nasopharyngeal Swab  Result Value Ref Range Status   SARS Coronavirus 2 NEGATIVE NEGATIVE Final    Comment: (NOTE) SARS-CoV-2 target nucleic acids are NOT DETECTED. The SARS-CoV-2 RNA is generally detectable in upper and lower respiratory specimens during the acute phase of infection. Negative results do not preclude SARS-CoV-2 infection, do not rule out co-infections with other pathogens, and should not be used as the sole basis for treatment or other patient management decisions. Negative results must be combined with clinical observations, patient history, and epidemiological information. The expected result is Negative. Fact Sheet for Patients: SugarRoll.be Fact Sheet for Healthcare Providers: https://www.woods-mathews.com/ This test is not yet approved or cleared by the [place] FDA and  has been authorized for detection and/or diagnosis of SARS-CoV-2 by FDA under an Emergency Use Authorization (EUA). This EUA will remain  in effect (meaning this test can be used) for the duration of the COVID-19 declaration under Section 56 4(b)(1) of the Act, 21 U.S.C. section 360bbb-3(b)(1), unless the authorization is terminated or revoked sooner. Performed at [facility], [place] [address] [place], [place] [address]      Time coordinating discharge: 32 minutes   SIGNED:   Barb Merino, MD  [facility] 02/18/2019, 12:07 PM

## 2019-02-18 NOTE — Progress Notes (Signed)
Patient alert and oriented, denies pain. Iv and tele d/c per order. D/c instruction explain and given to the patient daughter, all questions answered, pt. D/c per order.

## 2019-02-18 NOTE — Progress Notes (Signed)
[facility] Progress Note    Assessment/ Plan:   1. Renal failure - since 2013 - Feb 2019 patient has had mostly CKD IV disease w/ creat 1.6- 2.5. Favor progressive CKD.  No uremic symptoms.   Some loss of GFR with diuresis, after RHC diuretics stopped on 11/13.  Low-dose Lasix added back today 11/17 at 40 mg PO daily.  I suspect cardiorenal syndrome and likely will have fluctuating Cr based on vol status. Stable on PO diuretics, ok for d/c from my perspective with close followup before Thanksgiving with me.  I will arrange.  Discussed 32 oz fluid restriction and 2 g Na diet with pt and likely need to titrate diuretics as OP.  No uremic symptoms.      2. 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 HTN - cont meds BP's reasonably well controlled 4. Met acidosis - po bicarb started, stable >20 5. SP L AKA 6. Obesity 7. COPD 8. Anemia, transfuse as indicated 9. Hypomagnesemia, Diuretic/diet related, replete aggressively  Subjective:    Cr slightly better on PO Lasix.  Weights are all over the place.  Pt stated that she has never been on Lasix as OP.     Objective:   BP (!) 120/55   Pulse 81   Temp 99.5 F (37.5 C) (Oral)   Resp 18   Ht _0  (1.727 m)   Wt 70.2 kg   SpO2 100%   BMI 23.53 kg/m   Intake/Output Summary (Last 24 hours) at 02/18/2019 1144 Last data filed at 02/18/2019 0850 Gross per 24 hour  Intake 564 ml  Output 1000 ml  Net -436 ml   Weight change: -13.3 kg  Physical Exam: Gen NAD, on RA HEENT no JVD PULM: clear anteriorly bilaterally, slight crackles in bases that resolve with deep breathing RRR no MRG Soft, nontender Left AKA, right lower extremity with cobblestoning and thickened skin, doesn't appear to be edema Neuro AAO x 3, nonfocal  Imaging: No results found.  Labs: BMET Recent Labs  Lab 02/12/19 0805 02/13/19 0427 02/13/19 0804 02/13/19 0811 02/13/19 1047 02/14/19 0524  02/15/19 0356 02/16/19 0546 02/17/19 0319 02/18/19 0951  NA 141 141 144  143 144  --  141 144 141 142 141  K 4.3 4.1 4.1  4.2 4.1  --  3.7 3.7 3.8 3.8 4.0  CL 107 106  --   --   --  103 108 106 107 107  CO2 21* 22  --   --   --  _1 19* 21*  GLUCOSE 84 88  --   --   --  105* 92 99 95 132*  BUN 79* 84*  --   --   --  91* 90* 92* 100* 98*  CREATININE 4.01* 4.10*  --   --  4.24* 4.47* 4.06* 4.38* 4.58* 4.28*  CALCIUM 7.8* 7.9*  --   --   --  7.9* 7.8* 8.0* 7.9* 7.8*   CBC Recent Labs  Lab 02/15/19 0356 02/16/19 0546 02/17/19 0319 02/18/19 0951  WBC 11.2* 12.4* 11.8* 11.1*  NEUTROABS 7.2 8.1* 7.7  --   HGB 9.7* 9.2* 8.8* 9.0*  HCT 30.6* 30.2* 28.7* 28.8*  MCV 95.0 97.4 97.3 96.0  PLT 280 279 287 281    Medications:    . amLODipine  10 mg Oral Daily  . aspirin  81 mg Oral Daily  . atorvastatin  40 mg Oral q1800  .  enoxaparin (LOVENOX) injection  30 mg Subcutaneous Q24H  . furosemide  40 mg Oral Daily  . gabapentin  100 mg Oral BID  . influenza vaccine adjuvanted  0.5 mL Intramuscular Tomorrow-1000  . lubriderm seriously sensitive   Topical TID  . mouth rinse  15 mL Mouth Rinse BID  . metoprolol tartrate  50 mg Oral BID  . sildenafil  20 mg Oral TID  . sodium bicarbonate  650 mg Oral TID  . sodium chloride flush  3 mL Intravenous Q12H  . sodium chloride flush  3 mL Intravenous Q12H      Madelon Lips, MD 02/18/2019, 11:44 AM

## 2019-02-18 NOTE — TOC Transition Note (Addendum)
Transition of Care [facility]) - CM/SW Discharge Note   Patient Details  Name: Stephanie Griffith MRN: [id number] Date of Birth: May 09, 1945  [facility]) CM/SW Contact:  Zenon Mayo, RN Phone Number: 02/18/2019, 12:17 PM   Clinical Narrative:    From home with daughter, CHF, has prosthesis is at home, cont to diurese. NCM offered choice, she states she has no preference. NCM made referral to [facility] with [facility] for [facility] , he states he is able to take referral. Soc will begin 24 to 48 hrs post dc. Patient was observation and changed to inpatient.  NCM notified Tommi Rumps with [facility] that patient is for dc today.  Notified MD to put [facility] order in also.  Daughter wants an aide added to orders. NCM notified MD.  Tommi Rumps with [facility] notified. NCM also gave daughter the PCS paper work to take to patient 's primary MD.     Final next level of care: Home w Home Health Services Barriers to Discharge: No Barriers Identified   Patient Goals and CMS Choice Patient states their goals for this hospitalization and ongoing recovery are:: go home CMS Medicare.gov Compare Post Acute Care list provided to:: Patient Choice offered to / list presented to : Patient  Discharge Placement                       Discharge Plan and Services                DME Arranged: (NA)         HH Arranged: RN, Disease Management [place] Agency: [place] Date [place]: 02/09/19 Time [place]: [id number] Representative spoke with at [place]: [place] ([place]) Interventions     Readmission Risk Interventions No flowsheet data found.

## 2019-02-20 ENCOUNTER — Telehealth (HOSPITAL_COMMUNITY): Payer: Self-pay

## 2019-02-20 NOTE — Telephone Encounter (Signed)
Have attempted to contact patient to schedule appt for Pul Htn, no answer have left several voicemails, no return call to date.

## 2019-04-07 ENCOUNTER — Encounter (HOSPITAL_COMMUNITY): Payer: Self-pay | Admitting: *Deleted

## 2019-04-07 ENCOUNTER — Other Ambulatory Visit: Payer: Self-pay

## 2019-04-07 ENCOUNTER — Emergency Department (HOSPITAL_COMMUNITY)
Admission: EM | Admit: 2019-04-07 | Discharge: 2019-04-08 | Disposition: A | Discharge status: Home / Self Care | Payer: Medicare Other | Attending: Emergency Medicine | Admitting: Emergency Medicine

## 2019-04-07 ENCOUNTER — Emergency Department (HOSPITAL_COMMUNITY): Payer: Medicare Other

## 2019-04-07 DIAGNOSIS — Z7982 Long term (current) use of aspirin: Secondary | ICD-10-CM | POA: Diagnosis not present

## 2019-04-07 DIAGNOSIS — I739 Peripheral vascular disease, unspecified: Secondary | ICD-10-CM | POA: Diagnosis not present

## 2019-04-07 DIAGNOSIS — G459 Transient cerebral ischemic attack, unspecified: Secondary | ICD-10-CM | POA: Insufficient documentation

## 2019-04-07 DIAGNOSIS — I130 Hypertensive heart and chronic kidney disease with heart failure and stage 1 through stage 4 chronic kidney disease, or unspecified chronic kidney disease: Secondary | ICD-10-CM | POA: Insufficient documentation

## 2019-04-07 DIAGNOSIS — I480 Paroxysmal atrial fibrillation: Secondary | ICD-10-CM | POA: Diagnosis not present

## 2019-04-07 DIAGNOSIS — N184 Chronic kidney disease, stage 4 (severe): Secondary | ICD-10-CM | POA: Insufficient documentation

## 2019-04-07 DIAGNOSIS — R531 Weakness: Secondary | ICD-10-CM

## 2019-04-07 DIAGNOSIS — Z79899 Other long term (current) drug therapy: Secondary | ICD-10-CM | POA: Insufficient documentation

## 2019-04-07 DIAGNOSIS — R4182 Altered mental status, unspecified: Secondary | ICD-10-CM | POA: Diagnosis present

## 2019-04-07 DIAGNOSIS — I5033 Acute on chronic diastolic (congestive) heart failure: Secondary | ICD-10-CM | POA: Diagnosis not present

## 2019-04-07 LAB — COMPREHENSIVE METABOLIC PANEL
ALT: 19 U/L (ref 0–44)
AST: 26 U/L (ref 15–41)
Albumin: 2.9 g/dL — ABNORMAL LOW (ref 3.5–5.0)
Alkaline Phosphatase: 214 U/L — ABNORMAL HIGH (ref 38–126)
Anion gap: 13 (ref 5–15)
BUN: 70 mg/dL — ABNORMAL HIGH (ref 8–23)
CO2: 14 mmol/L — ABNORMAL LOW (ref 22–32)
Calcium: 8 mg/dL — ABNORMAL LOW (ref 8.9–10.3)
Chloride: 117 mmol/L — ABNORMAL HIGH (ref 98–111)
Creatinine, Ser: 3.57 mg/dL — ABNORMAL HIGH (ref 0.44–1.00)
GFR calc Af Amer: 14 mL/min — ABNORMAL LOW (ref >60)
GFR calc non Af Amer: 12 mL/min — ABNORMAL LOW (ref >60)
Glucose, Bld: 150 mg/dL — ABNORMAL HIGH (ref 70–99)
Potassium: 4.8 mmol/L (ref 3.5–5.1)
Sodium: 144 mmol/L (ref 135–145)
Total Bilirubin: 0.6 mg/dL (ref 0.3–1.2)
Total Protein: 7.7 g/dL (ref 6.5–8.1)

## 2019-04-07 LAB — DIFFERENTIAL
Abs Immature Granulocytes: 0.07 10*3/uL (ref 0.00–0.07)
Basophils Absolute: 0 10*3/uL (ref 0.0–0.1)
Basophils Relative: 0 %
Eosinophils Absolute: 0.2 10*3/uL (ref 0.0–0.5)
Eosinophils Relative: 3 %
Immature Granulocytes: 1 %
Lymphocytes Relative: 16 %
Lymphs Abs: 1.5 10*3/uL (ref 0.7–4.0)
Monocytes Absolute: 1 10*3/uL (ref 0.1–1.0)
Monocytes Relative: 11 %
Neutro Abs: 6.6 10*3/uL (ref 1.7–7.7)
Neutrophils Relative %: 69 %

## 2019-04-07 LAB — CBC
HCT: 30.8 % — ABNORMAL LOW (ref 36.0–46.0)
Hemoglobin: 9.3 g/dL — ABNORMAL LOW (ref 12.0–15.0)
MCH: 29.4 pg (ref 26.0–34.0)
MCHC: 30.2 g/dL (ref 30.0–36.0)
MCV: 97.5 fL (ref 80.0–100.0)
Platelets: 255 10*3/uL (ref 150–400)
RBC: 3.16 MIL/uL — ABNORMAL LOW (ref 3.87–5.11)
RDW: 15.1 % (ref 11.5–15.5)
WBC: 9.4 10*3/uL (ref 4.0–10.5)
nRBC: 0 % (ref 0.0–0.2)

## 2019-04-07 LAB — PROTIME-INR
INR: 1.2 (ref 0.8–1.2)
Prothrombin Time: 15.3 seconds — ABNORMAL HIGH (ref 11.4–15.2)

## 2019-04-07 LAB — I-STAT CHEM 8, ED
BUN: 66 mg/dL — ABNORMAL HIGH (ref 8–23)
Calcium, Ion: 1.03 mmol/L — ABNORMAL LOW (ref 1.15–1.40)
Chloride: 119 mmol/L — ABNORMAL HIGH (ref 98–111)
Creatinine, Ser: 3.9 mg/dL — ABNORMAL HIGH (ref 0.44–1.00)
Glucose, Bld: 141 mg/dL — ABNORMAL HIGH (ref 70–99)
HCT: 29 % — ABNORMAL LOW (ref 36.0–46.0)
Hemoglobin: 9.9 g/dL — ABNORMAL LOW (ref 12.0–15.0)
Potassium: 4.8 mmol/L (ref 3.5–5.1)
Sodium: 148 mmol/L — ABNORMAL HIGH (ref 135–145)
TCO2: 16 mmol/L — ABNORMAL LOW (ref 22–32)

## 2019-04-07 LAB — APTT: aPTT: 38 seconds — ABNORMAL HIGH (ref 24–36)

## 2019-04-07 IMAGING — MR MR HEAD W/O CM
9 of 11 series · 33 of 48 positions shown · non-contrast
Comparison: None.

CLINICAL DATA: Ataxia

EXAM:
MRI HEAD WITHOUT CONTRAST
TECHNIQUE: Multiplanar, multiecho pulse sequences of the brain and surrounding
structures were obtained without intravenous contrast.

[Series 2: DWI · axial · 3.0mm · 0.94mm/px · z∈[-47,+103]mm · 8 of 102 slices shown (1 of 2)]
[im 1/102]
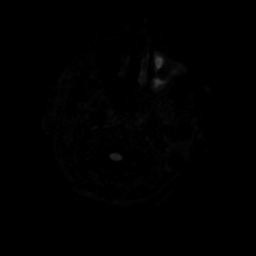
[im 15/102]
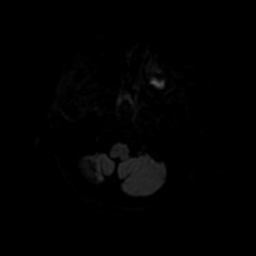
[im 29/102]
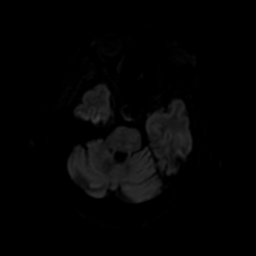
[im 44/102]
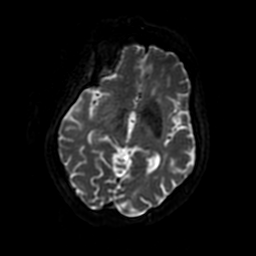
[im 58/102]
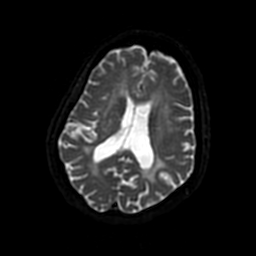
[im 73/102]
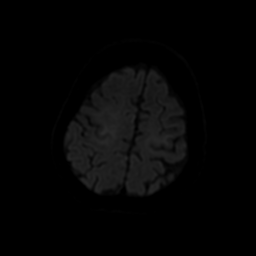
[im 87/102]
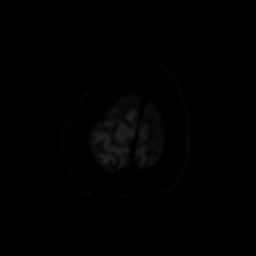
[im 102/102]
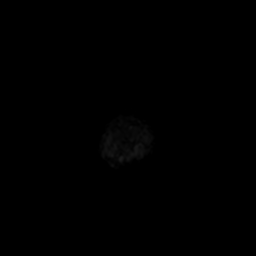

[Series 3: DWI · coronal · 4.0mm · 0.94mm/px · 5 of 72 slices shown (2 of 2)]
[im 1/72]
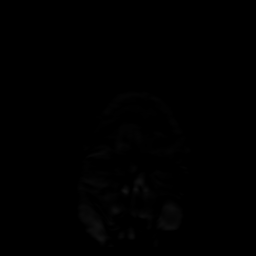
[im 18/72]
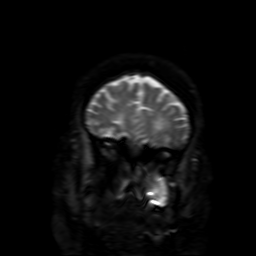
[im 36/72]
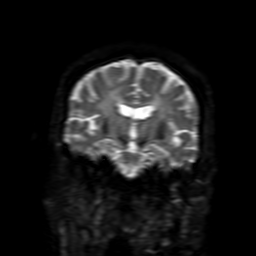
[im 54/72]
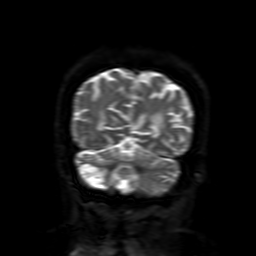
[im 72/72]
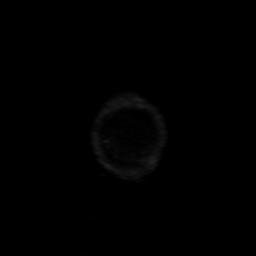

[Series 4: FLAIR · sagittal · 5.0mm · 0.47mm/px · 2 of 26 slices shown (1 of 2)]
[im 1/26]
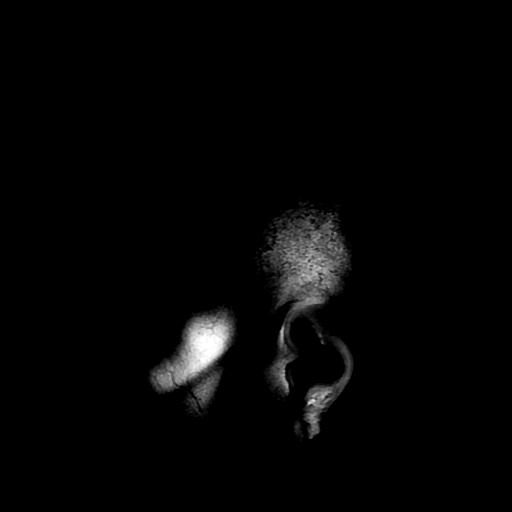
[im 26/26]
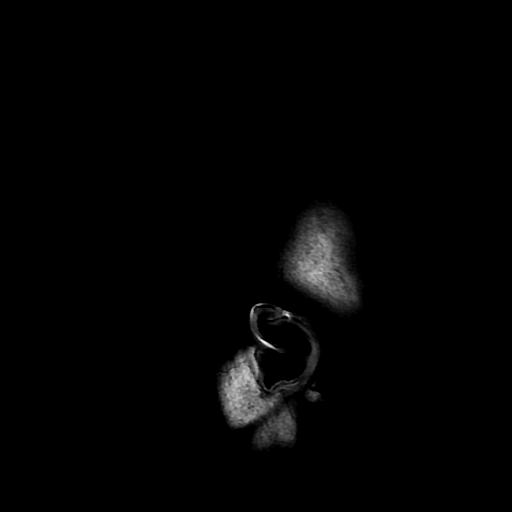

[Series 5: T2 · axial · 5.0mm · 0.47mm/px · z∈[-59,+97]mm · 2 of 27 slices shown (1 of 2)]
[im 1/27]
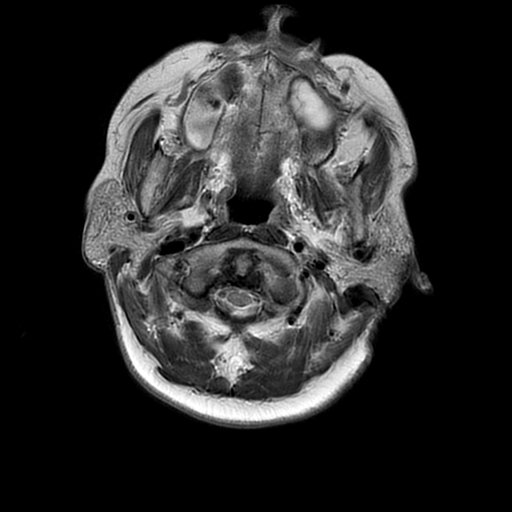
[im 27/27]
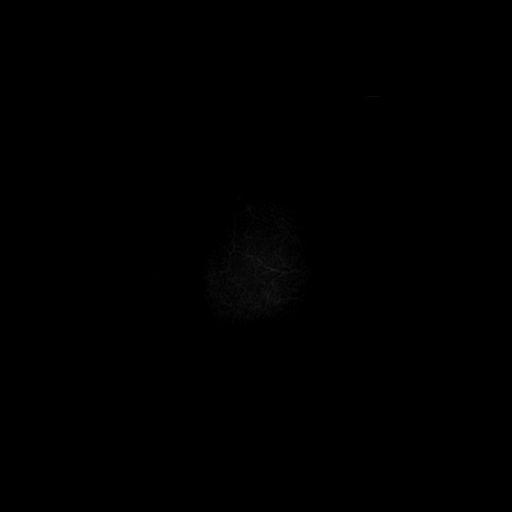

[Series 6: FLAIR · axial · 3.0mm · 0.45mm/px · z∈[-62,+92]mm · 2 of 27 slices shown (2 of 2)]
[im 1/27]
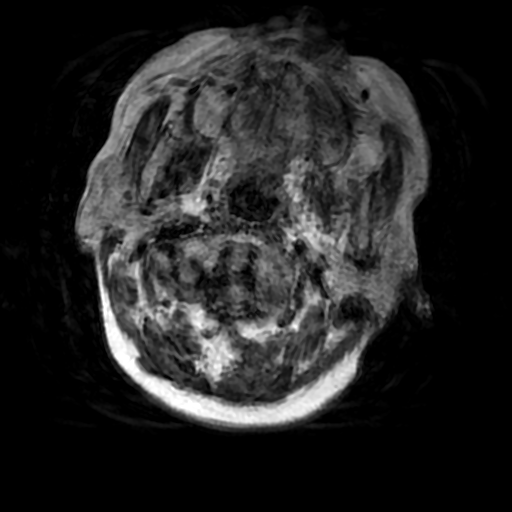
[im 27/27]
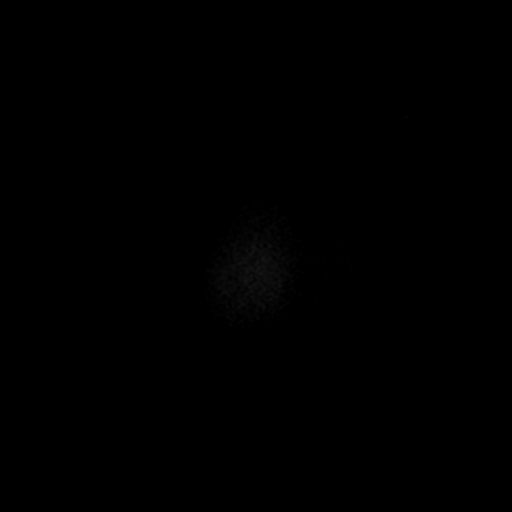

[Series 7: SWI · axial · 3.0mm · 0.47mm/px · z∈[-44,+39]mm · 5 of 100 slices shown]
[im 1/100]
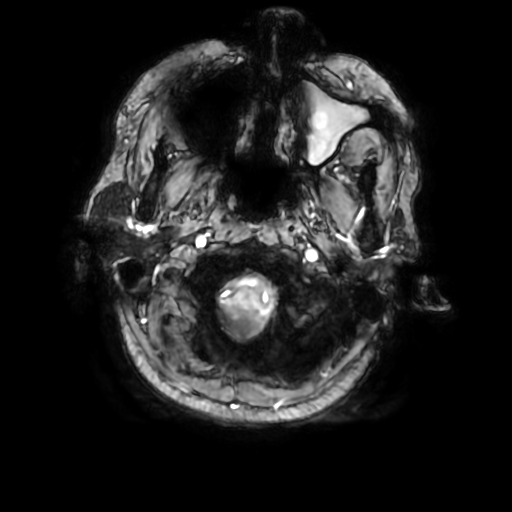
[im 15/100]
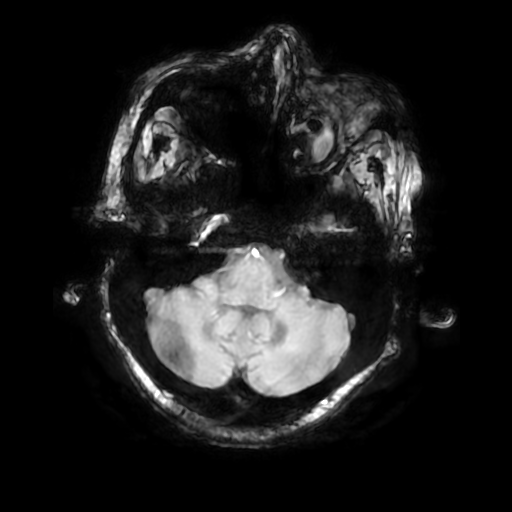
[im 29/100]
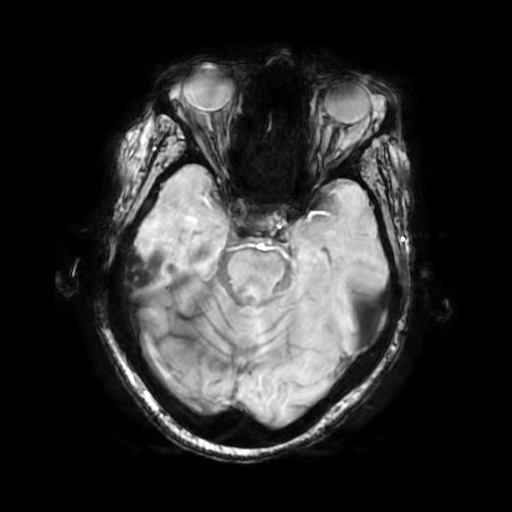
[im 43/100]
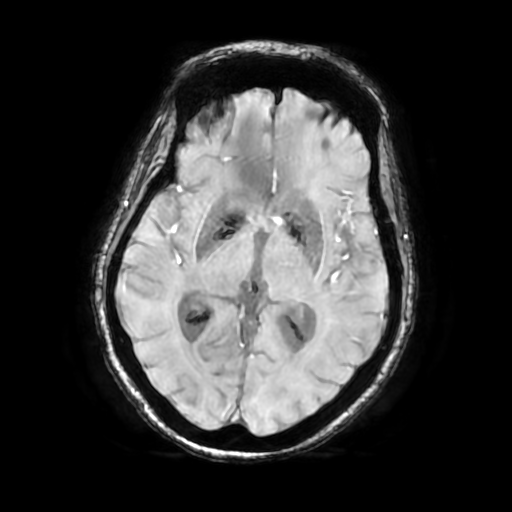
[im 57/100]
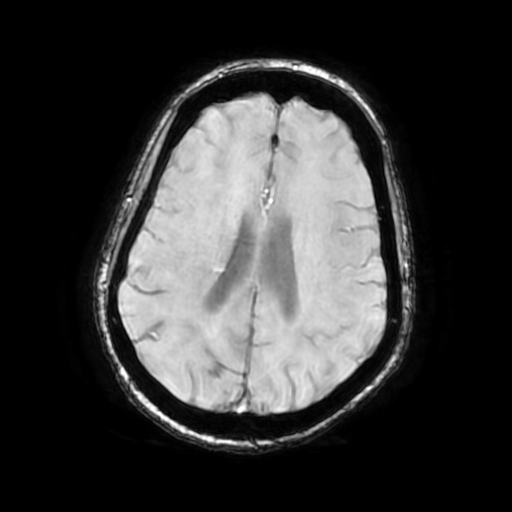

[Series 9: T2 · coronal · 5.0mm · 0.39mm/px · 2 of 32 slices shown (2 of 2)]
[im 1/32]
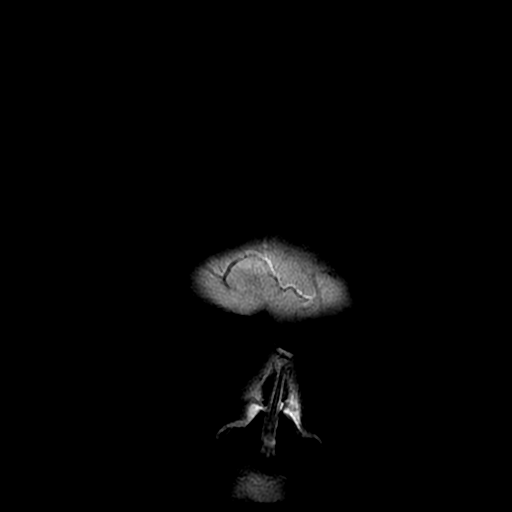
[im 32/32]
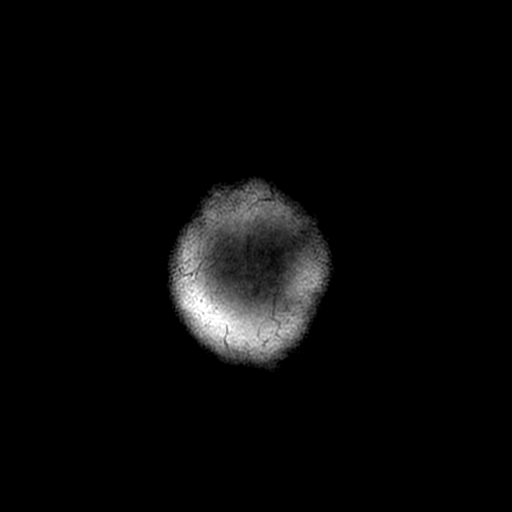

[Series 250: ADC · axial · 3.0mm · 0.94mm/px · z∈[-47,+103]mm · 4 of 51 slices shown (1 of 2)]
[im 1/51]
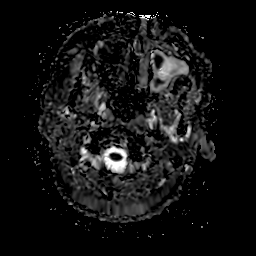
[im 17/51]
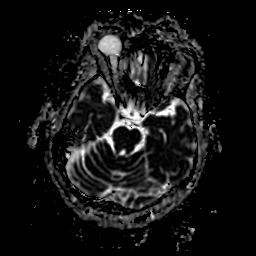
[im 34/51]
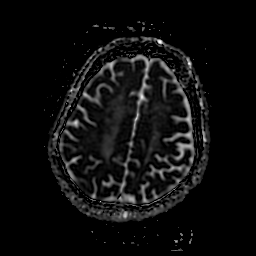
[im 51/51]
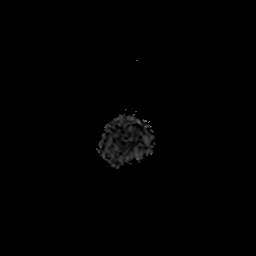

[Series 350: ADC · coronal · 4.0mm · 0.94mm/px · 3 of 36 slices shown (2 of 2)]
[im 1/36]
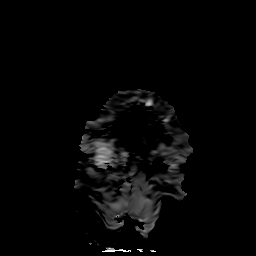
[im 18/36]
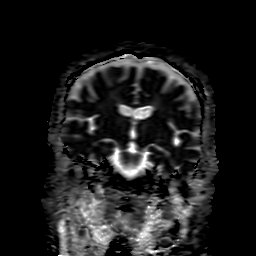
[im 36/36]
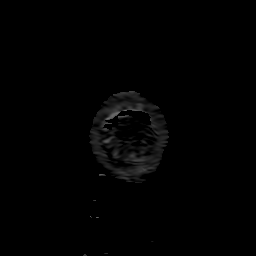

[33 of 48 positions shown; findings below may reference images not displayed]

FINDINGS: BRAIN: There is no acute infarct, acute hemorrhage or extra-axial
collection. Diffuse confluent hyperintense T2-weighted signal within
the periventricular, deep and juxtacortical white matter, most
commonly due to chronic ischemic microangiopathy. There are old
bilateral cerebellar infarcts. A partially empty sella is
incidentally noted.

VASCULAR: The major intracranial arterial and venous sinus flow
voids are normal. Susceptibility-sensitive sequences show no chronic
microhemorrhage or superficial siderosis.

SKULL AND UPPER CERVICAL SPINE: Calvarial bone marrow signal is
normal. There is no skull base mass. The visualized upper cervical
spine and soft tissues are normal.

SINUSES/ORBITS: Complete opacification of the left maxillary sinus.
No mastoid or middle ear effusion. The orbits are normal.
IMPRESSION: 1. No acute intracranial abnormality.
2. Severe chronic small vessel disease.
3. Old bilateral cerebellar infarcts.

## 2019-04-07 IMAGING — CT CT HEAD W/O CM
4 series · 16 of 47 positions shown, 18 images · non-contrast
Comparison: Head CT [DATE].

CLINICAL DATA: 73-year-old female with abnormal speech and
confusion since waking at [4H] hours.

EXAM:
CT HEAD WITHOUT CONTRAST
TECHNIQUE: Contiguous axial images were obtained from the base of the skull
through the vertex without intravenous contrast.

[Series 3: head without · axial · non-contrast · 0.44mm/px · z∈[-219,-99]mm · 7 of 34 slices shown, 9 images]
[im 5/34  brain]
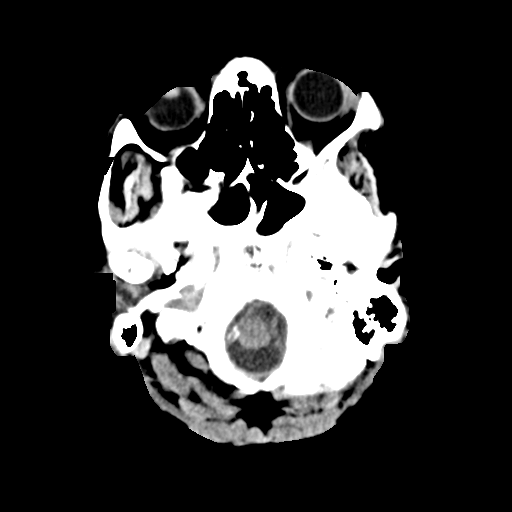
[im 5/34  bone]
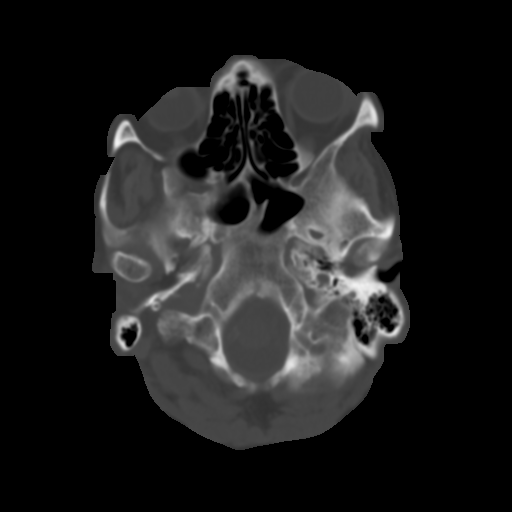
[im 9/34  brain]
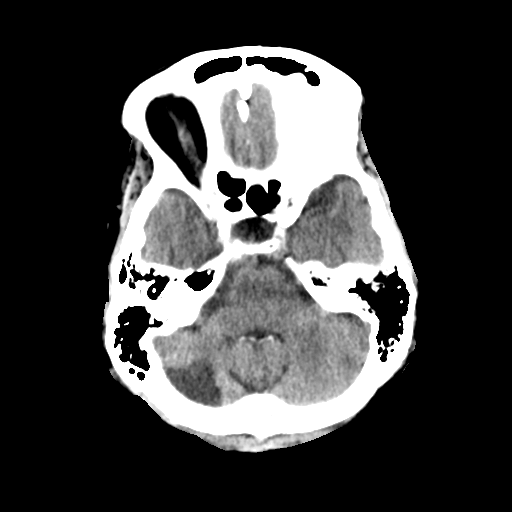
[im 13/34  brain]
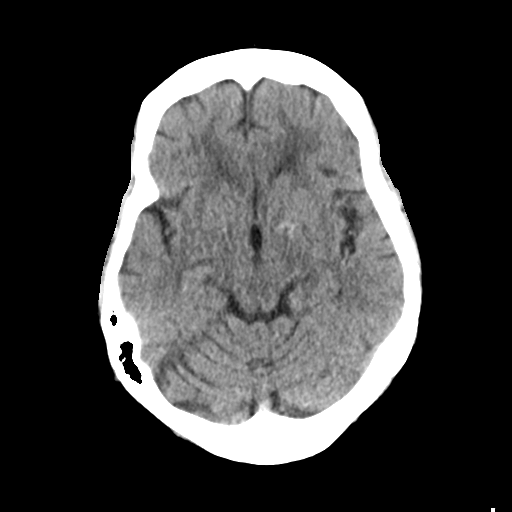
[im 17/34  brain]
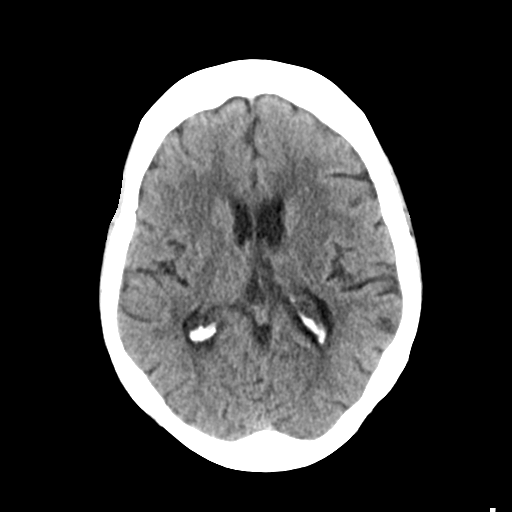
[im 21/34  brain]
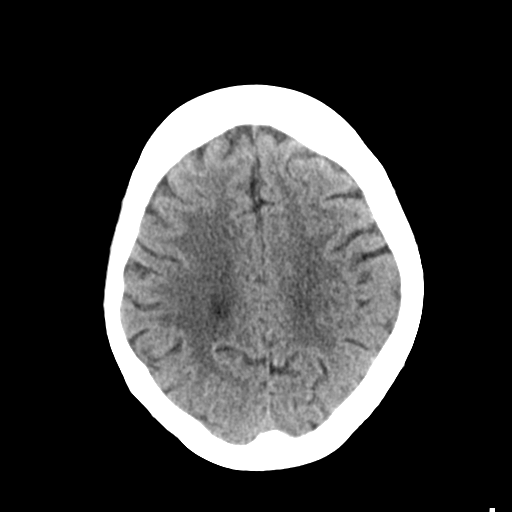
[im 21/34  bone]
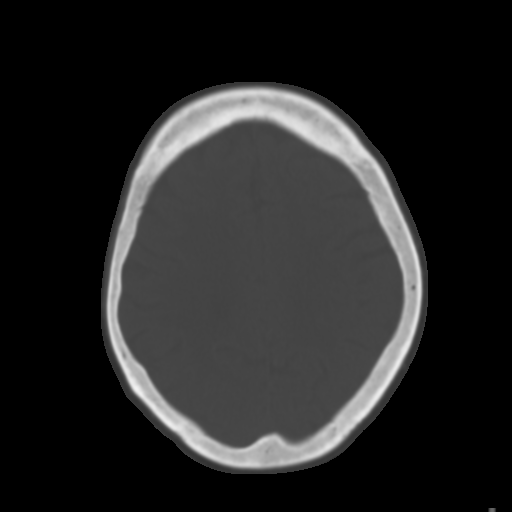
[im 25/34  brain]
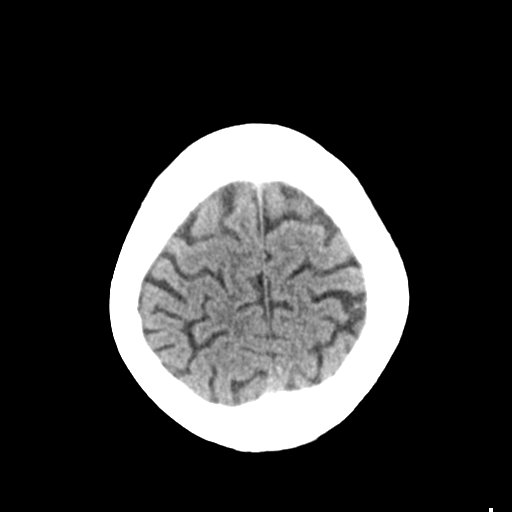
[im 29/34  brain]
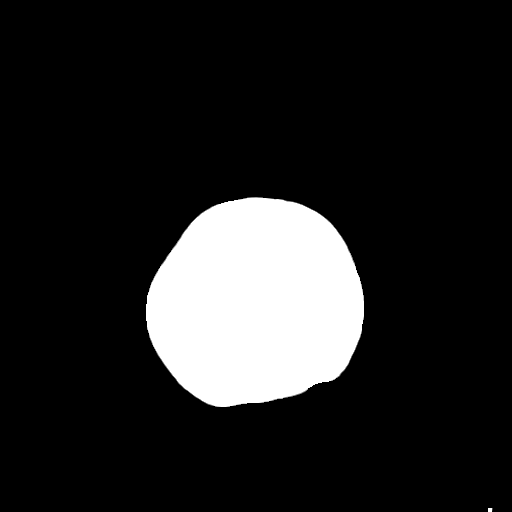

[Series 4: head bone · axial · 0.44mm/px · z∈[-223,-191]mm · 3 of 83 slices shown]
[im 9/83  bone]
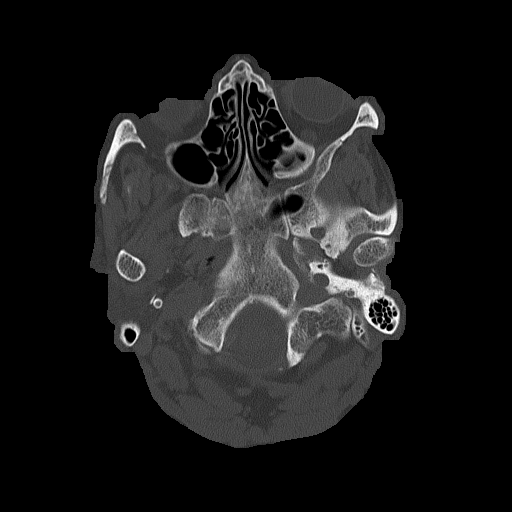
[im 17/83  bone]
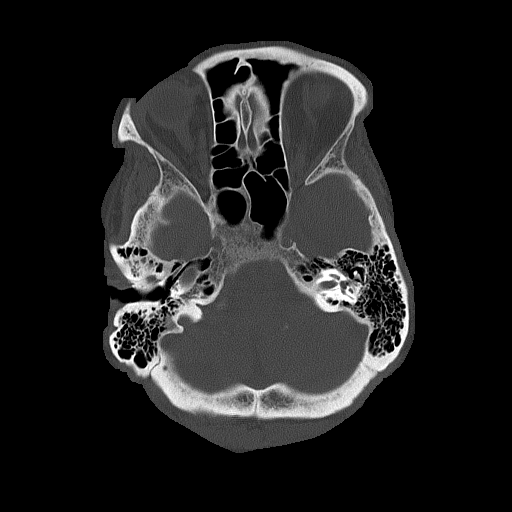
[im 25/83  bone]
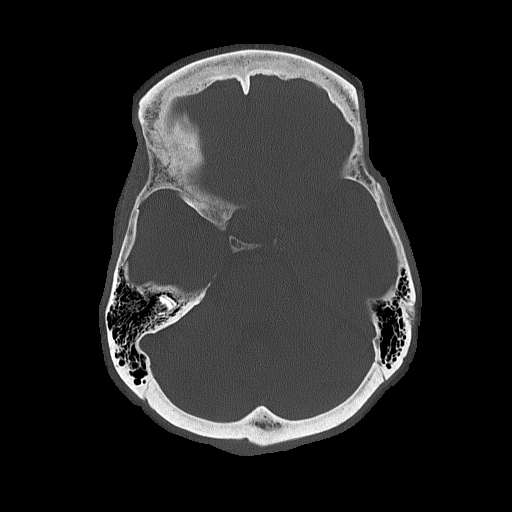

[Series 5: head without cor · coronal · non-contrast · 0.32mm/px · 3 of 67 slices shown]
[im 23/67  brain]
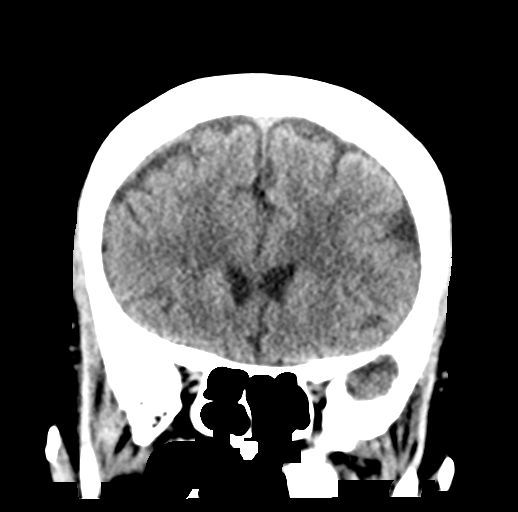
[im 30/67  brain]
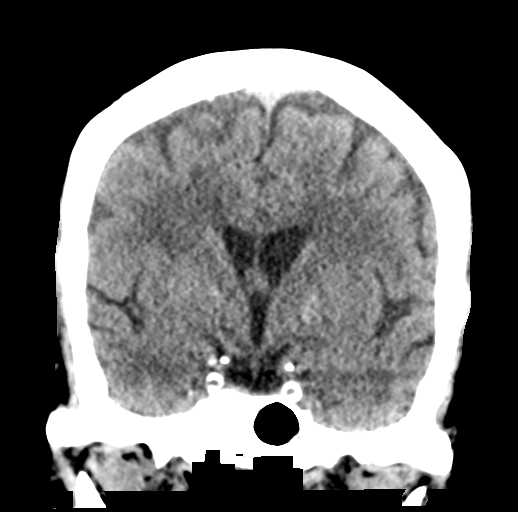
[im 37/67  brain]
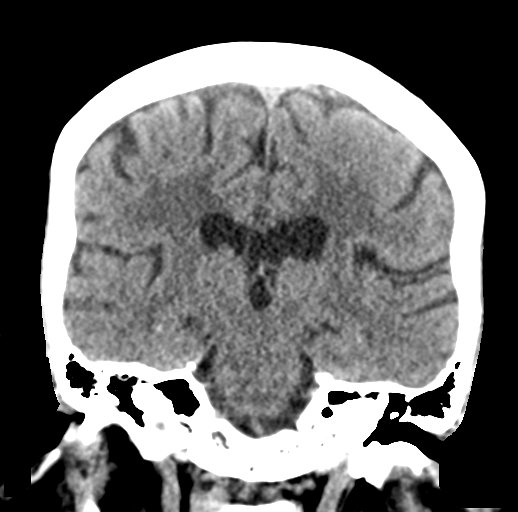

[Series 6: head without sag · sagittal · non-contrast · 0.32mm/px · 3 of 54 slices shown]
[im 18/54  brain]
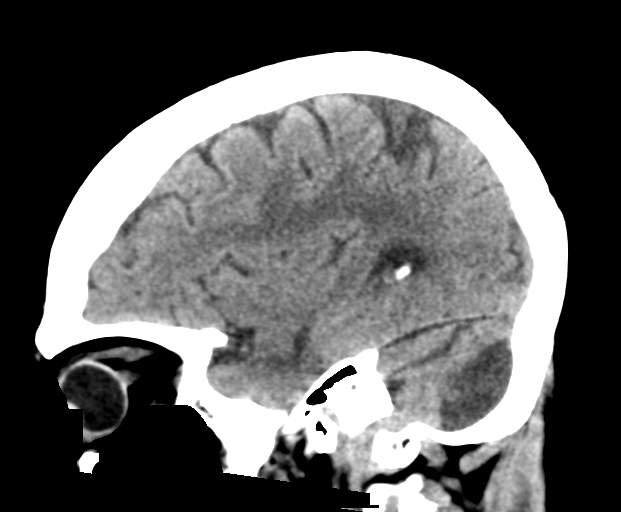
[im 27/54  brain]
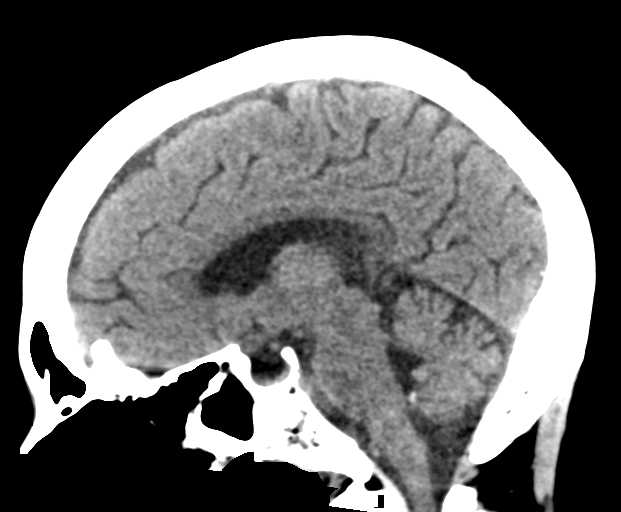
[im 36/54  brain]
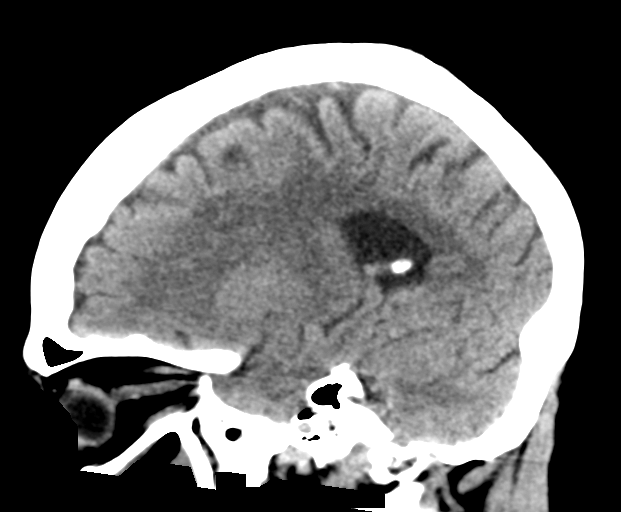

[16 of 47 positions shown; findings below may reference images not displayed]

FINDINGS: Brain: Stable cerebral volume since [4H]. Chronic right cerebellum
moderate sized PICA territory infarct. Patchy and confluent
bilateral cerebral white matter hypodensity, with deep white matter
involvement. The deep gray nuclei appear relatively spared.

No midline shift, ventriculomegaly, mass effect, evidence of mass
lesion, intracranial hemorrhage or evidence of cortically based
acute infarction. Stable dystrophic basal ganglia calcifications.

Vascular: Calcified atherosclerosis at the skull base. No suspicious
intracranial vascular hyperdensity.

Skull: No acute osseous abnormality identified.

Sinuses/Orbits: New subtotal left maxillary sinus opacification and
associated periosteal thickening. The remaining Visualized paranasal
sinuses and mastoids are stable and well pneumatized.

Other: Mildly Disconjugate gaze but otherwise negative orbits.
Visualized scalp soft tissues are within normal limits.
IMPRESSION: 1. No acute intracranial abnormality.
Stable non contrast CT appearance of the brain since [4H] with
chronic small vessel and right cerebellum PICA territory ischemia.
2. New acute on chronic left maxillary sinusitis.

## 2019-04-07 MED ORDER — SODIUM CHLORIDE 0.9% FLUSH: 3.0000 mL | Freq: Once | INTRAVENOUS | Status: DC

## 2019-04-07 NOTE — Discharge Instructions (Addendum)
Your testing here today did not show any signs of stroke.  You will need to follow-up closely with your doctor for a recheck and further evaluation.

## 2019-04-07 NOTE — ED Notes (Signed)
Patient transported to MRI 

## 2019-04-07 NOTE — ED Notes (Addendum)
[name] patient's daughter [phone]) requested an update once patient is in room, she also provided patient's other daughter's info-Koretta [phone]- to be called if she is unreachable.

## 2019-04-07 NOTE — ED Triage Notes (Signed)
Pt arrived by [facility] from home. Family reports pt being at baseline when she went to bed last night 9pm. . Since getting up at [place], she is having garbled speech and confusion.denies hx of chf. Grips equal, no facial droop noted at triage.

## 2019-04-07 NOTE — ED Notes (Signed)
pts daughter wants to be called when pt gets a room

## 2019-04-07 NOTE — ED Notes (Signed)
Gave EKG to dr. Ashok Cordia.

## 2019-04-07 NOTE — ED Notes (Signed)
When spoke to pt daughter [name], was asked to make sure pt clean and dry, often incontinent. Assisted pt with pericare and into clean depend at this time.

## 2019-04-07 NOTE — ED Notes (Signed)
Stephanie Griffith (Son#[phone]) called for an update.  Thank you

## 2019-04-07 NOTE — ED Notes (Signed)
CALLED [facility] FOR TRANSPORT HOME--Stephanie Griffith  

## 2019-04-08 NOTE — ED Notes (Signed)
Daughter called for an update- this RN informed daughter that mother had been discharged and was simply waiting for discharge.

## 2019-04-14 NOTE — ED Provider Notes (Signed)
[facility] EMERGENCY DEPARTMENT Provider Note   CSN: [id number] Arrival date & time: 04/07/19  [id number]     History Chief Complaint  Patient presents with  . Aphasia  . Altered Mental Status    Stephanie Griffith is a 74 y.o. female. This time. HPI Patient presents to the emergency department with some confusion this morning.  The patient states she did not feel confused but she states that her daughters were concerned about this.  Patient states she did not have any weakness.  The patient states that her daughter thought that she seemed out of it but the patient states she felt completely normal.  The patient states she has no no complaints at this time.  The patient denies chest pain, shortness of breath, headache,blurred vision, neck pain, fever, cough, weakness, numbness, dizziness, anorexia, edema, abdominal pain, nausea, vomiting, diarrhea, rash, back pain, dysuria, hematemesis, bloody stool, near syncope, or syncope.  Past Medical History:  Diagnosis Date  . Anemia of chronic disease   . Chronic diastolic CHF (congestive heart failure) ([place])   . Chronic renal insufficiency, stage IV (severe) ([place])   . COPD (chronic obstructive pulmonary disease) ([place])   . Hyperglycemia   . Hyperlipidemia   . Hypertension   . PAD (peripheral artery disease) ([place])    a. s/p femoral to below knee popliteal bypass then L AKA.  Marland Kitchen PAF (paroxysmal atrial fibrillation) ([place])   . PAT (paroxysmal atrial tachycardia) ([place])   . Pulmonary hypertension ([place])   . Umbilical hernia     Patient Active Problem List   Diagnosis Date Noted  . Acute on chronic diastolic CHF (congestive heart failure) ([place]) 02/06/2019  . Chronic renal insufficiency, stage IV (severe) ([place]) 02/06/2019  . Metabolic acidosis 123456  . Pressure injury of skin 01/03/2016  . Acute hypoxemic respiratory failure ([place])   . Acute pulmonary edema ([facility])   . Hyperkalemia 12/30/2015  . Incarcerated hernia 12/30/2015  .  Phantom limb pain ([place]) 08/13/2011  . PVD (peripheral vascular disease) with claudication ([place]) 07/26/2011  . S/P AKA (above knee amputation) ([place]) 06/15/2011  . NSTEMI (non-ST elevated myocardial infarction) ([place]) 06/09/2011  . Acute diastolic heart failure ([place]) 06/09/2011  . Atrial fibrillation ([place]) 06/08/2011  . Cholelithiasis and cholecystitis without obstruction 06/03/2011  . Ventral hernia 06/03/2011  . Acute on chronic renal failure ([place]) 06/02/2011  . PAD (peripheral artery disease) ([place]) 06/02/2011  . Dry gangrene ([place]) 05/31/2011  . Abdominal pain 05/31/2011  . Leukocytosis 05/31/2011  . Hyponatremia 05/31/2011  . Hyperglycemia 05/31/2011  . Normocytic anemia 12/22/2007  . Chronic kidney disease (CKD), stage III (moderate) 12/22/2007  . CHEST PAIN, HX OF 12/22/2007  . Essential hypertension, benign 12/10/2007    Past Surgical History:  Procedure Laterality Date  . AMPUTATION  06/06/2011   Procedure: AMPUTATION DIGIT;  Surgeon: Elam Dutch, MD;  Location: [facility] OR;  Service: Vascular;  Laterality: Left;  Transmetatarsal amputation left great toe  . AMPUTATION  06/12/2011   Procedure: AMPUTATION ABOVE KNEE;  Surgeon: Elam Dutch, MD;  Location: [place];  Service: Vascular;  Laterality: Left;  . FEMORAL-POPLITEAL BYPASS GRAFT  06/06/2011   Procedure: BYPASS GRAFT FEMORAL-POPLITEAL ARTERY;  Surgeon: Elam Dutch, MD;  Location: [facility] OR;  Service: Vascular;  Laterality: Left;  left femoral to popliteal bypass with composite vein and propaten 64mm x 80 graft  . LOWER EXTREMITY ANGIOGRAM N/A 06/04/2011   Procedure: LOWER EXTREMITY ANGIOGRAM;  Surgeon: Angelia Mould, MD;  Location:  [place] CATH LAB;  Service: Cardiovascular;  Laterality: N/A;  . OMENTECTOMY  12/31/2015   Procedure: Partial OMENTECTOMY;  Surgeon: Georganna Skeans, MD;  Location: [place];  Service: General;;  . RIGHT HEART CATH N/A 02/13/2019   Procedure: RIGHT HEART CATH;  Surgeon: Jolaine Artist, MD;   Location: [place] CV LAB;  Service: Cardiovascular;  Laterality: N/A;  . TUBAL LIGATION    . VENTRAL HERNIA REPAIR N/A 12/31/2015   Procedure: REPAIR INCARCERATED VENTRAL HERNIA, POSSIBLE BOWEL RESECTION;  Surgeon: Georganna Skeans, MD;  Location: [place];  Service: General;  Laterality: N/A;     OB History   No obstetric history on file.     Family History  Problem Relation Age of Onset  . Heart attack Father   . Heart disease Father   . Diabetes Mother   . Hypertension Mother   . Heart attack Brother     Social History   Tobacco Use  . Smoking status: Former Smoker    Packs/day: 2.00    Years: 50.00    Pack years: 100.00    Quit date: 06/01/2011    Years since quitting: 7.8  . Smokeless tobacco: Never Used  Substance Use Topics  . Alcohol use: No  . Drug use: No    Home Medications Prior to Admission medications   Medication Sig Start Date End Date Taking? Authorizing Provider  amLODipine (NORVASC) 10 MG tablet Take 10 mg by mouth daily.  07/08/14   [provider]  aspirin 81 MG EC tablet Take 81 mg by mouth daily.      [provider]  atorvastatin (LIPITOR) 40 MG tablet Take 1 tablet (40 mg total) by mouth daily at 6 PM. 02/18/19 03/20/19  Barb Merino, MD  furosemide (LASIX) 40 MG tablet Take 1 tablet (40 mg total) by mouth daily. 02/19/19   Barb Merino, MD  gabapentin (NEURONTIN) 100 MG capsule Take 100 mg by mouth 2 (two) times daily.  06/23/14   [provider]  metoprolol (LOPRESSOR) 50 MG tablet Take 1 tablet (50 mg total) by mouth 2 (two) times daily. 01/10/16   Domenic Polite, MD  Multiple Vitamin (MULTIVITAMIN WITH MINERALS) TABS tablet Take 1 tablet by mouth daily.    [provider]    Allergies    Morphine and related  Review of Systems   Review of Systems All other systems negative except as documented in the HPI. All pertinent positives and negatives as reviewed in the HPI. Physical Exam Updated Vital  Signs BP (!) 156/67 (BP Location: Right Arm)   Pulse 89   Temp 98 F (36.7 C) (Oral)   Resp 16   SpO2 99%   Physical Exam Vitals and nursing note reviewed.  Constitutional:      General: She is not in acute distress.    Appearance: She is well-developed.  HENT:     Head: Normocephalic and atraumatic.  Eyes:     Pupils: Pupils are equal, round, and reactive to light.  Cardiovascular:     Rate and Rhythm: Normal rate and regular rhythm.     Heart sounds: Normal heart sounds. No murmur. No friction rub. No gallop.   Pulmonary:     Effort: Pulmonary effort is normal. No respiratory distress.     Breath sounds: Normal breath sounds. No wheezing.  Abdominal:     General: Bowel sounds are normal. There is no distension.     Palpations: Abdomen is soft.     Tenderness: There  is no abdominal tenderness.  Musculoskeletal:     Cervical back: Normal range of motion and neck supple.  Skin:    General: Skin is warm and dry.     Capillary Refill: Capillary refill takes less than 2 seconds.     Findings: No erythema or rash.  Neurological:     Mental Status: She is alert and oriented to person, place, and time.     Motor: No abnormal muscle tone.     Coordination: Coordination normal.  Psychiatric:        Behavior: Behavior normal.     ED Results / Procedures / Treatments   Labs (all labs ordered are listed, but only abnormal results are displayed) Labs Reviewed  PROTIME-INR - Abnormal; Notable for the following components:      Result Value   Prothrombin Time 15.3 (*)    All other components within normal limits  APTT - Abnormal; Notable for the following components:   aPTT 38 (*)    All other components within normal limits  CBC - Abnormal; Notable for the following components:   RBC 3.16 (*)    Hemoglobin 9.3 (*)    HCT 30.8 (*)    All other components within normal limits  COMPREHENSIVE METABOLIC PANEL - Abnormal; Notable for the following components:   Chloride 117 (*)     CO2 14 (*)    Glucose, Bld 150 (*)    BUN 70 (*)    Creatinine, Ser 3.57 (*)    Calcium 8.0 (*)    Albumin 2.9 (*)    Alkaline Phosphatase 214 (*)    GFR calc non Af Amer 12 (*)    GFR calc Af Amer 14 (*)    All other components within normal limits  I-STAT CHEM 8, ED - Abnormal; Notable for the following components:   Sodium 148 (*)    Chloride 119 (*)    BUN 66 (*)    Creatinine, Ser 3.90 (*)    Glucose, Bld 141 (*)    Calcium, Ion 1.03 (*)    TCO2 16 (*)    Hemoglobin 9.9 (*)    HCT 29.0 (*)    All other components within normal limits  DIFFERENTIAL  CBG MONITORING, ED    EKG EKG Interpretation  Date/Time:  Tuesday April 07 2019 09:57:28 EST Ventricular Rate:  91 PR Interval:  158 QRS Duration: 74 QT Interval:  374 QTC Calculation: 460 R Axis:   127 Text Interpretation: Normal sinus rhythm Left posterior fascicular block Anterior infarct , age undetermined Abnormal ECG No significant change since last tracing Confirmed by Dorie Rank [phone]) on 04/07/2019 8:58:25 PM   Radiology No results found.  Procedures Procedures (including critical care time)  Medications Ordered in ED Medications - No data to display  ED Course  I have reviewed the triage vital signs and the nursing notes.  Pertinent labs & imaging results that were available during my care of the patient were reviewed by me and considered in my medical decision making (see chart for details).    MDM Rules/Calculators/A&P                      Spoke with neurology about the patient and we get an MRI to further assess if she has had any stroke.  The patient has been stable here in the emergency department.  The patient will need further work-up with her primary doctor for this possible confusion that occurred that it  was transient.  The patient is completely alert and oriented with me at this point.  Patient did not have any aphasia here in the emergency department and she was not showing any focal  deficits. Final Clinical Impression(s) / ED Diagnoses Final diagnoses:  Weakness  TIA (transient ischemic attack)    Rx / DC Orders ED Discharge Orders    None       Dalia Heading, PA-C 04/14/19 1539    Dorie Rank, MD 04/15/19 1446

## 2019-04-15 ENCOUNTER — Encounter (HOSPITAL_COMMUNITY): Payer: Medicare Other | Admitting: Internal Medicine

## 2019-05-26 ENCOUNTER — Encounter (HOSPITAL_COMMUNITY): Payer: Medicare Other

## 2019-05-26 NOTE — Discharge Instructions (Signed)

## 2019-05-27 ENCOUNTER — Inpatient Hospital Stay (HOSPITAL_COMMUNITY)
Admission: RE | Admit: 2019-05-27 | Discharge: 2019-05-27 | Disposition: A | Discharge status: Home / Self Care | Payer: Medicare Other | Source: Ambulatory Visit | Attending: Nephrology | Admitting: Nephrology

## 2019-05-27 ENCOUNTER — Encounter (HOSPITAL_COMMUNITY): Payer: Self-pay

## 2019-06-10 ENCOUNTER — Encounter (HOSPITAL_COMMUNITY): Payer: Medicare Other

## 2020-03-02 ENCOUNTER — Emergency Department (HOSPITAL_COMMUNITY): Payer: Medicare Other

## 2020-03-02 ENCOUNTER — Inpatient Hospital Stay (HOSPITAL_COMMUNITY): Payer: Medicare Other

## 2020-03-02 ENCOUNTER — Inpatient Hospital Stay (HOSPITAL_COMMUNITY)
Admission: EM | Admit: 2020-03-02 | Discharge: 2020-03-05 | DRG: 071 | Disposition: A | Discharge status: Home Health | Payer: Medicare Other | Attending: Internal Medicine | Admitting: Internal Medicine

## 2020-03-02 ENCOUNTER — Encounter (HOSPITAL_COMMUNITY): Payer: Self-pay | Admitting: Emergency Medicine

## 2020-03-02 DIAGNOSIS — F0150 Vascular dementia without behavioral disturbance: Secondary | ICD-10-CM | POA: Diagnosis present

## 2020-03-02 DIAGNOSIS — Z89612 Acquired absence of left leg above knee: Secondary | ICD-10-CM

## 2020-03-02 DIAGNOSIS — I739 Peripheral vascular disease, unspecified: Secondary | ICD-10-CM | POA: Diagnosis present

## 2020-03-02 DIAGNOSIS — R4781 Slurred speech: Secondary | ICD-10-CM | POA: Diagnosis present

## 2020-03-02 DIAGNOSIS — Z7982 Long term (current) use of aspirin: Secondary | ICD-10-CM | POA: Diagnosis not present

## 2020-03-02 DIAGNOSIS — Z9851 Tubal ligation status: Secondary | ICD-10-CM | POA: Diagnosis not present

## 2020-03-02 DIAGNOSIS — E872 Acidosis, unspecified: Secondary | ICD-10-CM | POA: Diagnosis present

## 2020-03-02 DIAGNOSIS — N185 Chronic kidney disease, stage 5: Secondary | ICD-10-CM | POA: Diagnosis present

## 2020-03-02 DIAGNOSIS — Z8249 Family history of ischemic heart disease and other diseases of the circulatory system: Secondary | ICD-10-CM | POA: Diagnosis not present

## 2020-03-02 DIAGNOSIS — J449 Chronic obstructive pulmonary disease, unspecified: Secondary | ICD-10-CM | POA: Diagnosis present

## 2020-03-02 DIAGNOSIS — R4182 Altered mental status, unspecified: Secondary | ICD-10-CM | POA: Diagnosis not present

## 2020-03-02 DIAGNOSIS — Z20822 Contact with and (suspected) exposure to covid-19: Secondary | ICD-10-CM | POA: Diagnosis present

## 2020-03-02 DIAGNOSIS — I252 Old myocardial infarction: Secondary | ICD-10-CM

## 2020-03-02 DIAGNOSIS — N390 Urinary tract infection, site not specified: Secondary | ICD-10-CM | POA: Diagnosis present

## 2020-03-02 DIAGNOSIS — I5032 Chronic diastolic (congestive) heart failure: Secondary | ICD-10-CM | POA: Diagnosis present

## 2020-03-02 DIAGNOSIS — G319 Degenerative disease of nervous system, unspecified: Secondary | ICD-10-CM | POA: Diagnosis present

## 2020-03-02 DIAGNOSIS — E785 Hyperlipidemia, unspecified: Secondary | ICD-10-CM | POA: Diagnosis present

## 2020-03-02 DIAGNOSIS — G629 Polyneuropathy, unspecified: Secondary | ICD-10-CM | POA: Diagnosis present

## 2020-03-02 DIAGNOSIS — Z79899 Other long term (current) drug therapy: Secondary | ICD-10-CM

## 2020-03-02 DIAGNOSIS — G9341 Metabolic encephalopathy: Principal | ICD-10-CM | POA: Diagnosis present

## 2020-03-02 DIAGNOSIS — I132 Hypertensive heart and chronic kidney disease with heart failure and with stage 5 chronic kidney disease, or end stage renal disease: Secondary | ICD-10-CM | POA: Diagnosis present

## 2020-03-02 DIAGNOSIS — Z87891 Personal history of nicotine dependence: Secondary | ICD-10-CM

## 2020-03-02 DIAGNOSIS — Z885 Allergy status to narcotic agent status: Secondary | ICD-10-CM

## 2020-03-02 DIAGNOSIS — N179 Acute kidney failure, unspecified: Secondary | ICD-10-CM | POA: Diagnosis present

## 2020-03-02 DIAGNOSIS — R739 Hyperglycemia, unspecified: Secondary | ICD-10-CM | POA: Diagnosis present

## 2020-03-02 DIAGNOSIS — Z993 Dependence on wheelchair: Secondary | ICD-10-CM

## 2020-03-02 LAB — RESP PANEL BY RT-PCR (FLU A&B, COVID) ARPGX2
Influenza A by PCR: NEGATIVE
Influenza B by PCR: NEGATIVE
SARS Coronavirus 2 by RT PCR: NEGATIVE

## 2020-03-02 LAB — URINALYSIS, ROUTINE W REFLEX MICROSCOPIC
Bilirubin Urine: NEGATIVE
Glucose, UA: NEGATIVE mg/dL
Ketones, ur: NEGATIVE mg/dL
Nitrite: NEGATIVE
Protein, ur: 100 mg/dL — AB
Specific Gravity, Urine: 1.011 (ref 1.005–1.030)
pH: 8 (ref 5.0–8.0)

## 2020-03-02 LAB — CBC
HCT: 28.8 % — ABNORMAL LOW (ref 36.0–46.0)
Hemoglobin: 8.5 g/dL — ABNORMAL LOW (ref 12.0–15.0)
MCH: 29 pg (ref 26.0–34.0)
MCHC: 29.5 g/dL — ABNORMAL LOW (ref 30.0–36.0)
MCV: 98.3 fL (ref 80.0–100.0)
Platelets: 253 10*3/uL (ref 150–400)
RBC: 2.93 MIL/uL — ABNORMAL LOW (ref 3.87–5.11)
RDW: 16.1 % — ABNORMAL HIGH (ref 11.5–15.5)
WBC: 9.6 10*3/uL (ref 4.0–10.5)
nRBC: 0 % (ref 0.0–0.2)

## 2020-03-02 LAB — COMPREHENSIVE METABOLIC PANEL
ALT: 14 U/L (ref 0–44)
AST: 25 U/L (ref 15–41)
Albumin: 2.6 g/dL — ABNORMAL LOW (ref 3.5–5.0)
Alkaline Phosphatase: 106 U/L (ref 38–126)
Anion gap: 12 (ref 5–15)
BUN: 47 mg/dL — ABNORMAL HIGH (ref 8–23)
CO2: 20 mmol/L — ABNORMAL LOW (ref 22–32)
Calcium: 8.5 mg/dL — ABNORMAL LOW (ref 8.9–10.3)
Chloride: 113 mmol/L — ABNORMAL HIGH (ref 98–111)
Creatinine, Ser: 5.38 mg/dL — ABNORMAL HIGH (ref 0.44–1.00)
GFR, Estimated: 8 mL/min — ABNORMAL LOW (ref >60)
Glucose, Bld: 89 mg/dL (ref 70–99)
Potassium: 4.1 mmol/L (ref 3.5–5.1)
Sodium: 145 mmol/L (ref 135–145)
Total Bilirubin: 0.8 mg/dL (ref 0.3–1.2)
Total Protein: 7.6 g/dL (ref 6.5–8.1)

## 2020-03-02 LAB — BRAIN NATRIURETIC PEPTIDE: B Natriuretic Peptide: 1490.5 pg/mL — ABNORMAL HIGH (ref 0.0–100.0)

## 2020-03-02 IMAGING — DX DG CHEST 1V PORT
1 series · 1 of 1 positions shown · non-contrast
Comparison: Chest radiograph [DATE]

CLINICAL DATA: Altered mental status

EXAM:
PORTABLE CHEST 1 VIEW

[chest]
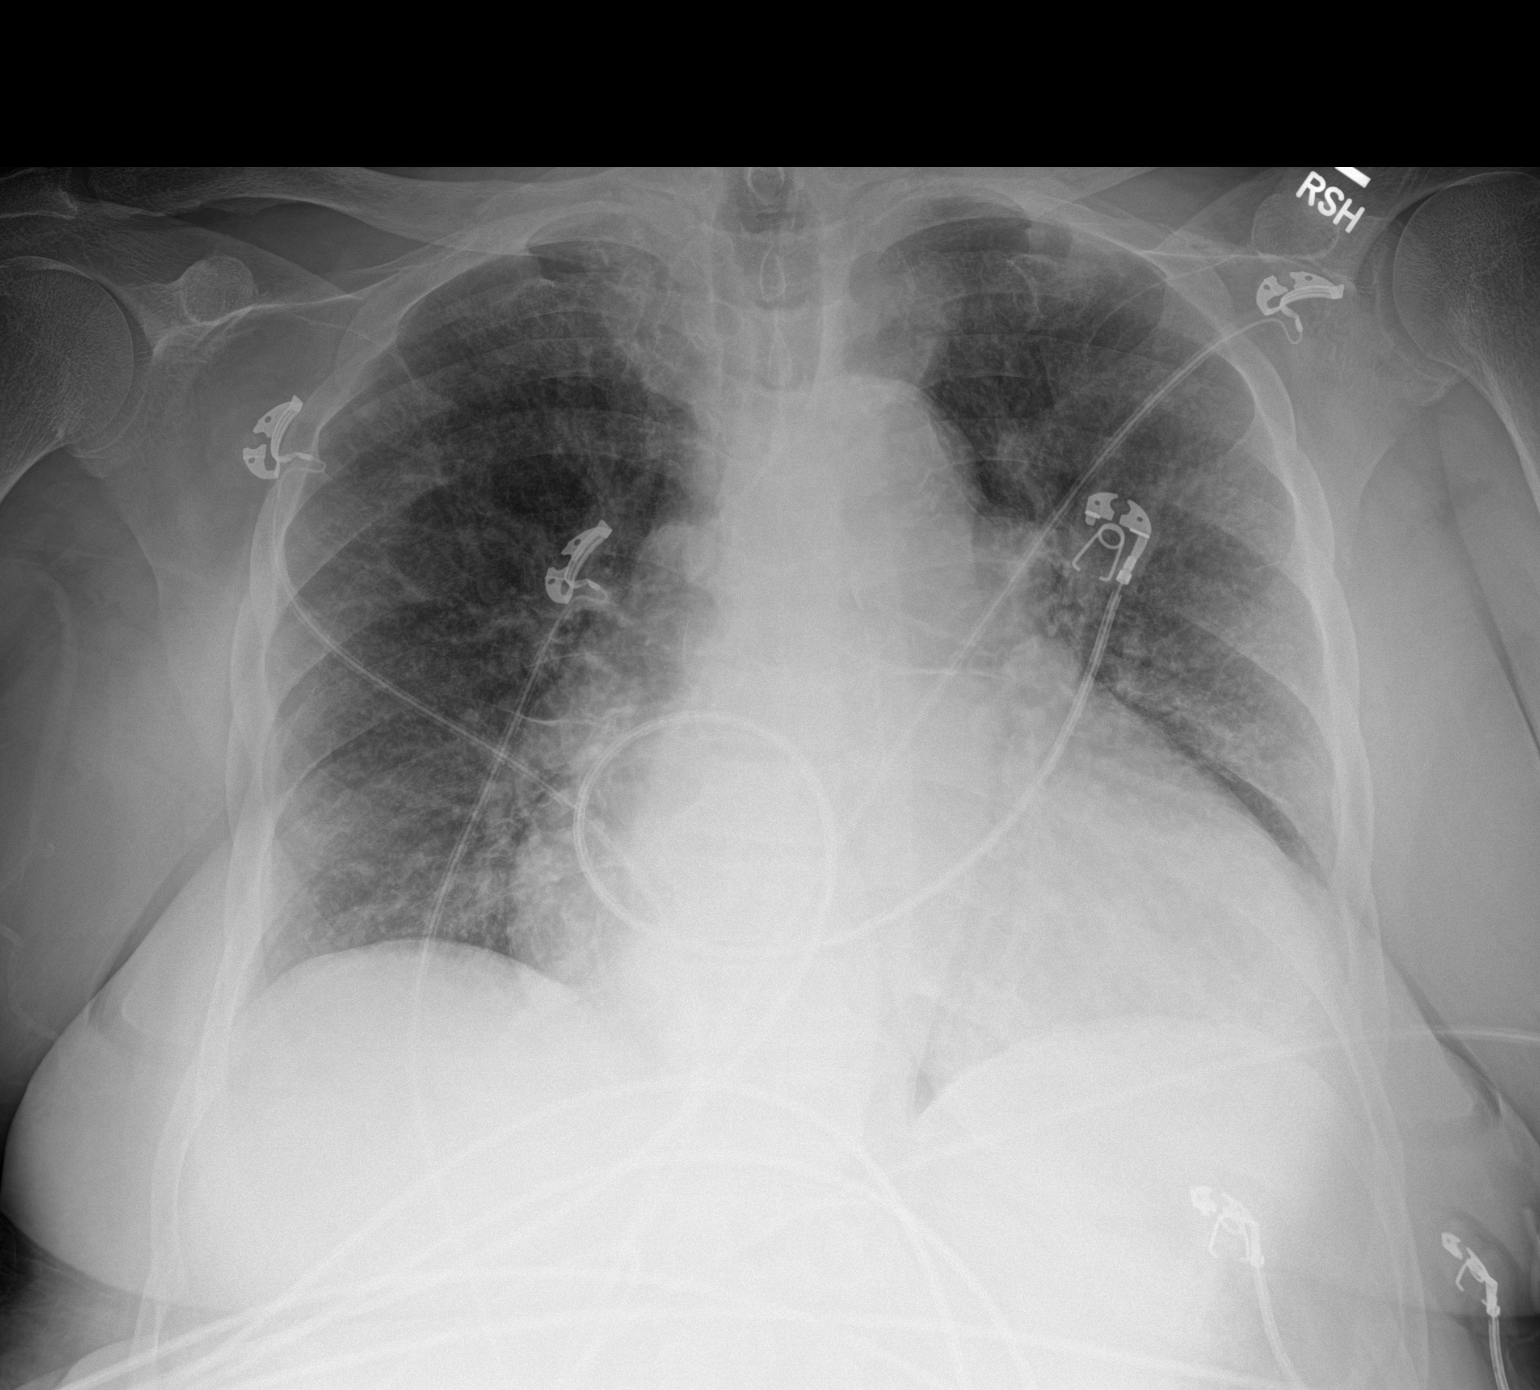

[1 of 1 positions shown; findings below may reference images not displayed]

FINDINGS: Cardiomegaly similar priors. Left greater than right interstitial
opacities. No visible pneumothorax or pleural effusion. No acute
osseous abnormality.
IMPRESSION: Left greater than right interstitial opacities, which may reflect
edema or atypical infection.

## 2020-03-02 IMAGING — MR MR HEAD W/O CM
12 of 13 series · 43 of 48 positions shown · non-contrast
Comparison: [DATE]

CLINICAL DATA: Mental status change

EXAM:
MRI HEAD WITHOUT CONTRAST
TECHNIQUE: Multiplanar, multiecho pulse sequences of the brain and surrounding
structures were obtained without intravenous contrast.

[Series 5: DWI · axial · 3.0mm · 0.88mm/px · z∈[-72,+68]mm · 6 of 96 slices shown (1 of 4)]
[im 1/96]
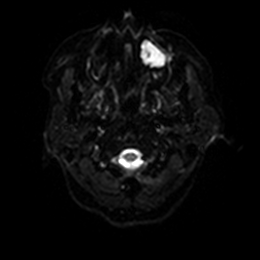
[im 20/96]
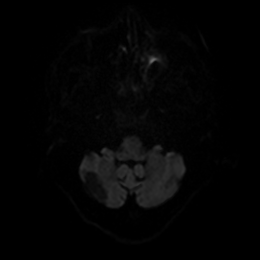
[im 39/96]
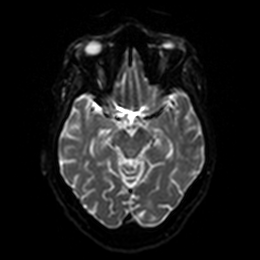
[im 58/96]
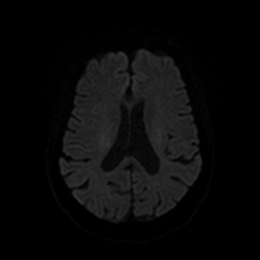
[im 77/96]
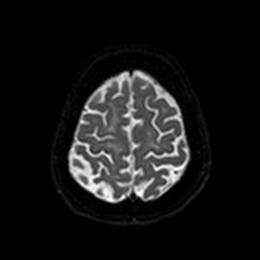
[im 96/96]
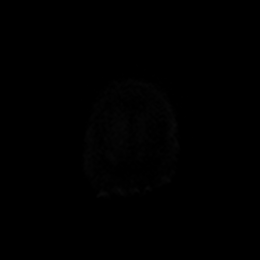

[Series 6: DWI · axial · 3.0mm · 0.88mm/px · z∈[-72,+68]mm · 3 of 48 slices shown (2 of 4)]
[im 1/48]
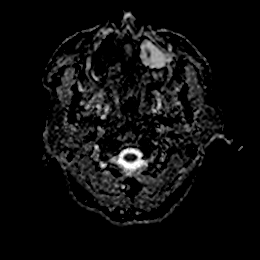
[im 24/48]
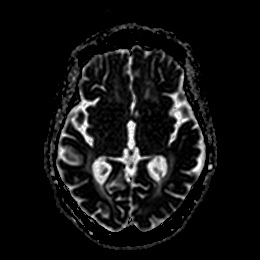
[im 48/48]
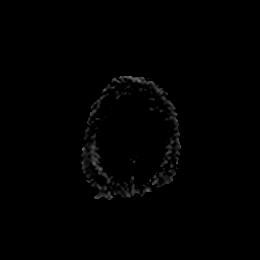

[Series 7: DWI · coronal · 4.0mm · 0.88mm/px · 5 of 68 slices shown (3 of 4)]
[im 1/68]
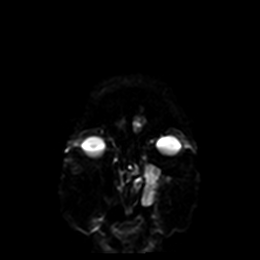
[im 17/68]
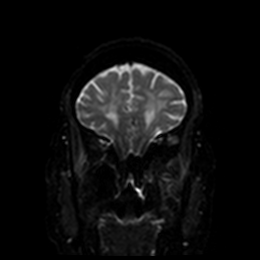
[im 34/68]
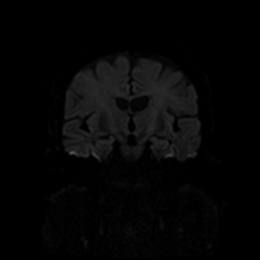
[im 51/68]
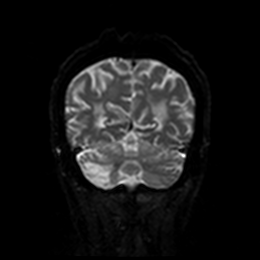
[im 68/68]
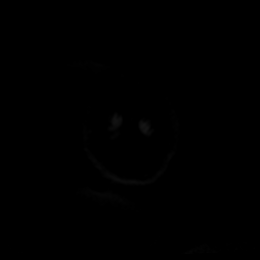

[Series 8: DWI · coronal · 4.0mm · 0.88mm/px · 3 of 34 slices shown (4 of 4)]
[im 1/34]
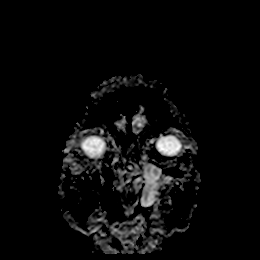
[im 17/34]
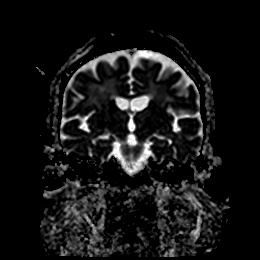
[im 34/34]
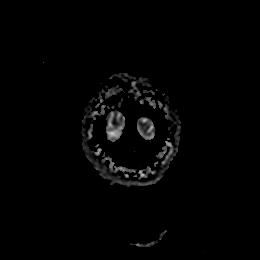

[Series 9: mag_images · axial · 3.0mm · 0.90mm/px · z∈[-87,+89]mm · 5 of 60 slices shown]
[im 1/60]
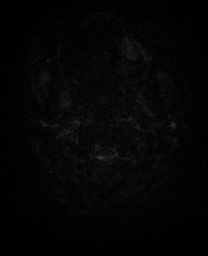
[im 15/60]
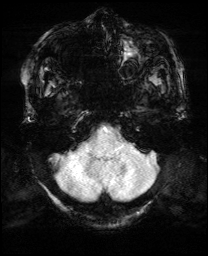
[im 30/60]
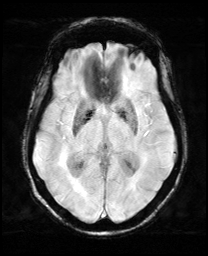
[im 45/60]
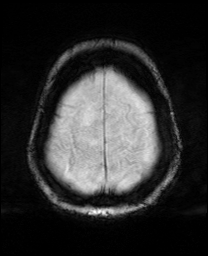
[im 60/60]
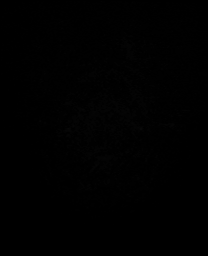

[Series 10: pha_images · axial · 3.0mm · 0.90mm/px · z∈[-87,+86]mm · 4 of 59 slices shown]
[im 1/59]
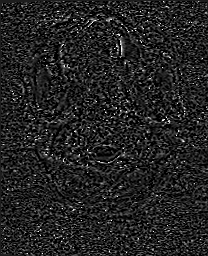
[im 20/59]
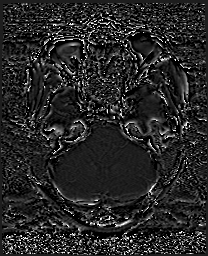
[im 39/59]
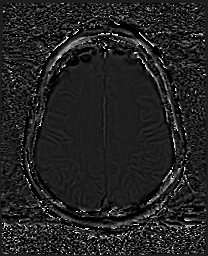
[im 59/59]
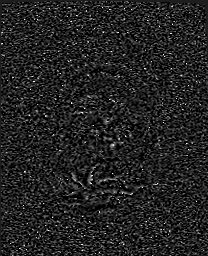

[Series 11: swi_images · axial · 3.0mm · 0.90mm/px · z∈[-87,+89]mm · 5 of 60 slices shown]
[im 1/60]
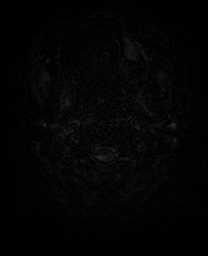
[im 15/60]
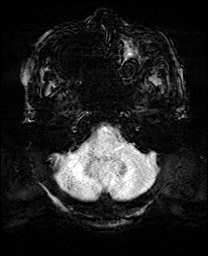
[im 30/60]
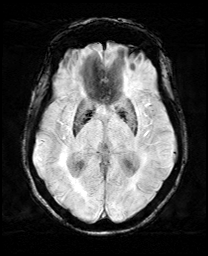
[im 45/60]
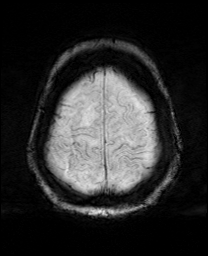
[im 60/60]
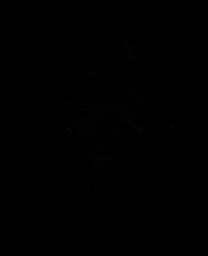

[Series 12: mip_images(sw) · axial · 24.0mm · 0.90mm/px · z∈[-77,+79]mm · 4 of 53 slices shown]
[im 1/53]
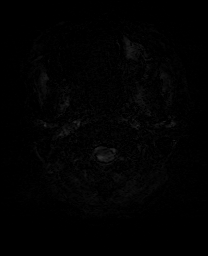
[im 18/53]
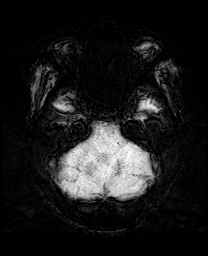
[im 35/53]
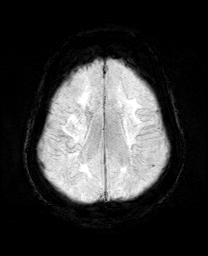
[im 53/53]
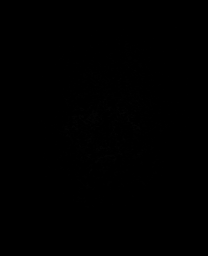

[Series 13: FLAIR · axial · 5.0mm · 0.45mm/px · z∈[-78,+66]mm · 2 of 25 slices shown]
[im 1/25]
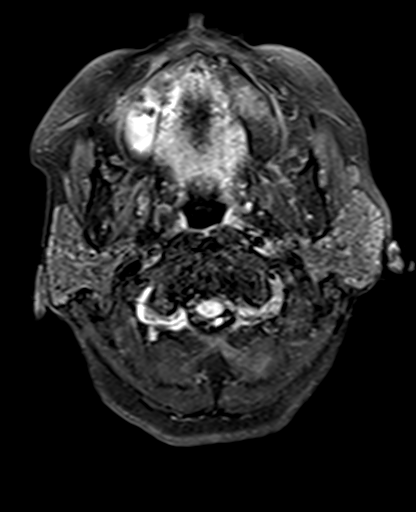
[im 25/25]
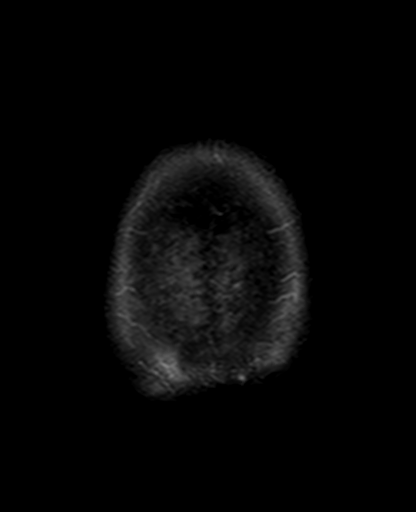

[Series 14: T1 · sagittal · 5.0mm · 0.75mm/px · 2 of 22 slices shown]
[im 1/22]
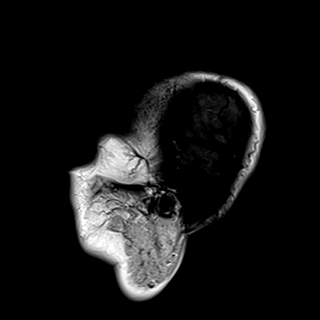
[im 22/22]
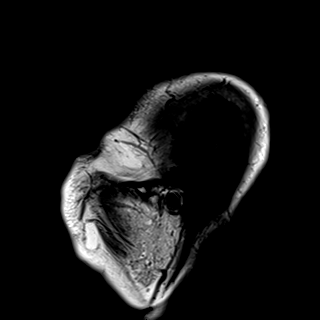

[Series 15: T2 · axial · 5.0mm · 0.72mm/px · z∈[-76,+67]mm · 2 of 25 slices shown (1 of 2)]
[im 1/25]
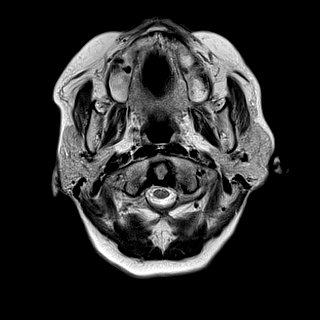
[im 25/25]
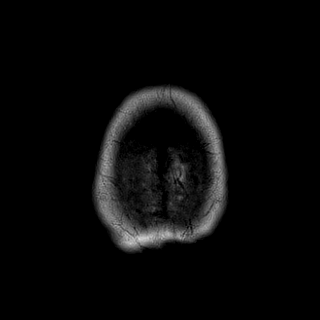

[Series 17: T2 · coronal · 5.0mm · 0.34mm/px · 2 of 29 slices shown (2 of 2)]
[im 1/29]
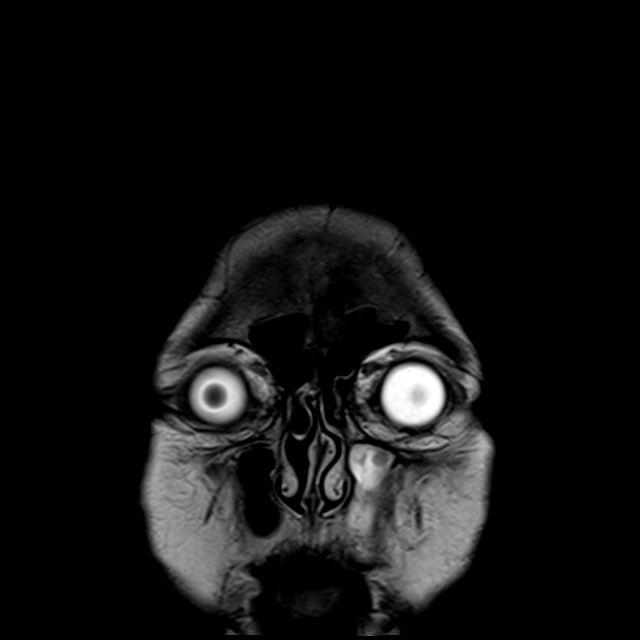
[im 29/29]
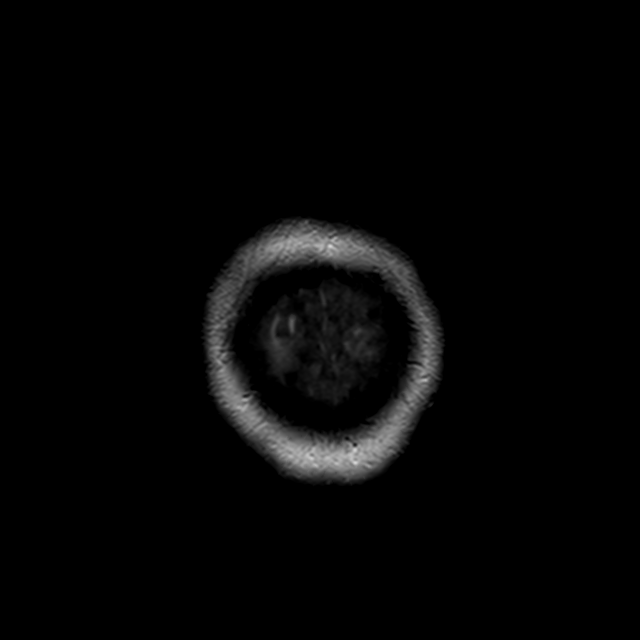

[43 of 48 positions shown; findings below may reference images not displayed]

FINDINGS: Brain: There is no acute infarction or intracranial hemorrhage.
Moderate size chronic right cerebellar infarct. Additional tiny
bilateral chronic cerebellar infarcts. Patchy and confluent areas of
T2 hyperintensity supratentorial white matter are nonspecific but
probably reflect stable moderate to advanced chronic microvascular
ischemic changes. There is no intracranial mass, mass effect, or
edema. There is no hydrocephalus or extra-axial fluid collection.
Ventricles and sulci are stable in size and configuration.

Vascular: Major vessel flow voids at the skull base are preserved.

Skull and upper cervical spine: Normal marrow signal is preserved.

Sinuses/Orbits: Chronic left maxillary sinusitis with persistent
circumferential mucosal thickening. Minor mucosal thickening
elsewhere. Orbits are unremarkable.

Other: Incidental note is made of partial empty sella. Mastoid air
cells are clear.
IMPRESSION: No acute infarction, hemorrhage, or mass.

Chronic cerebellar infarcts. Moderate to advanced chronic
microvascular ischemic changes.

## 2020-03-02 IMAGING — US US RENAL
1 series · 14 of 21 positions shown · non-contrast
Comparison: [DATE]

CLINICAL DATA: Acute renal injury

EXAM:
RENAL / URINARY TRACT ULTRASOUND COMPLETE

[Series 1: us renal · 14 of 21 slices shown]
[im 1/21]
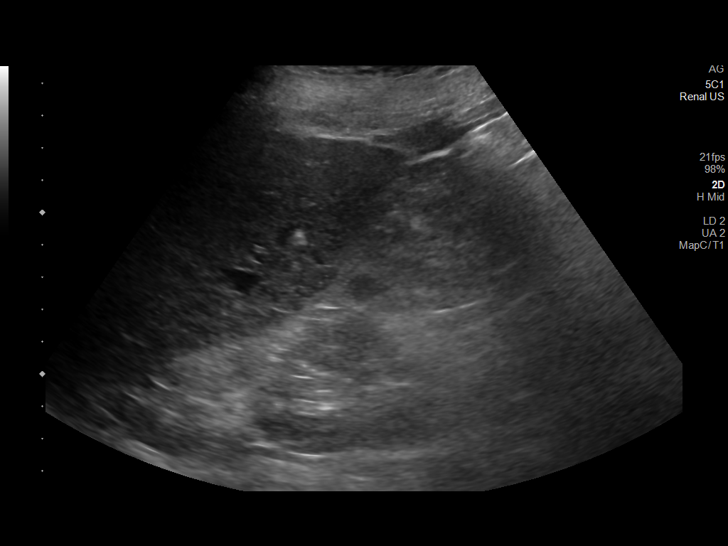
[im 3/21]
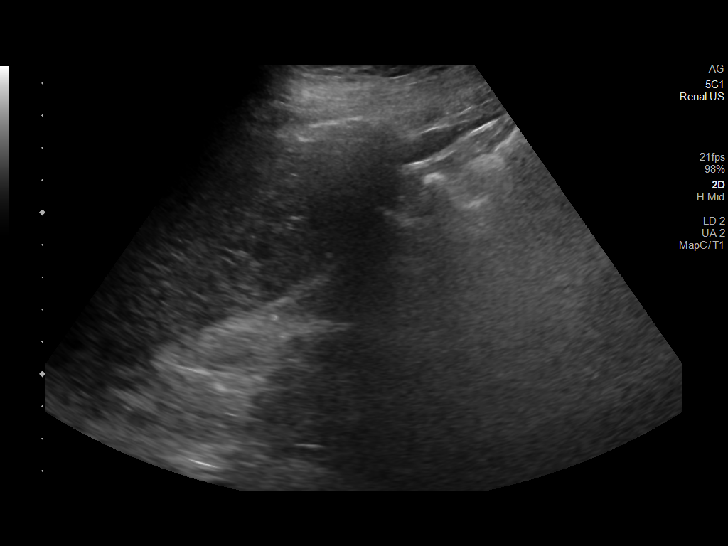
[im 4/21]
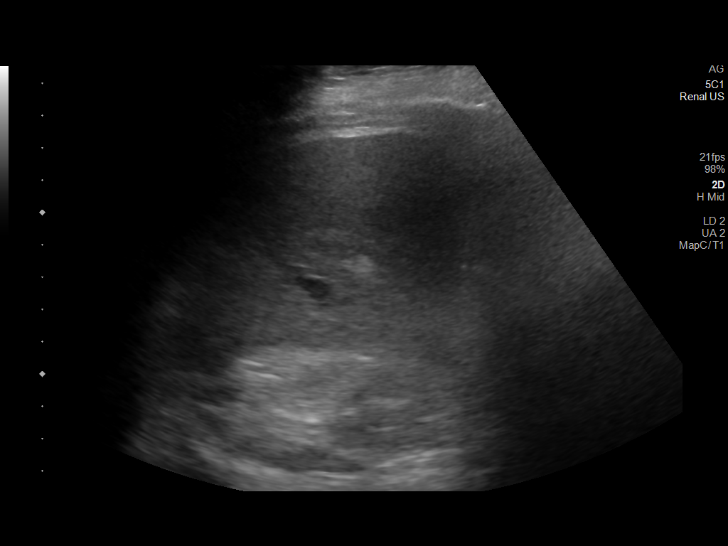
[im 6/21]
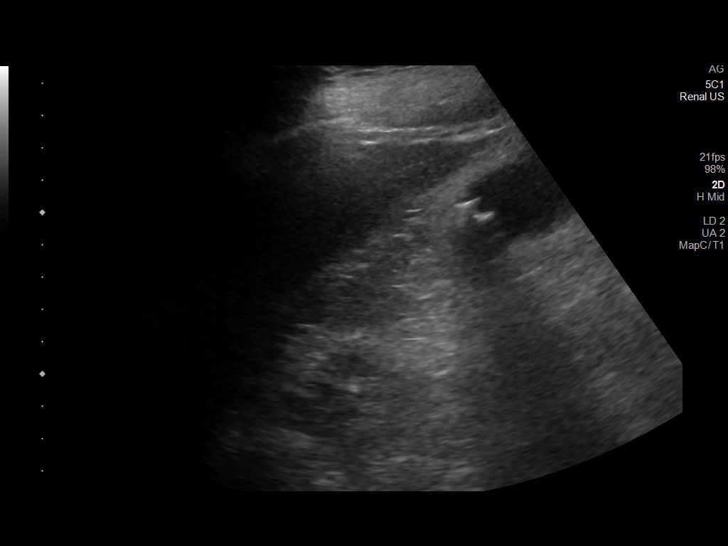
[im 7/21]
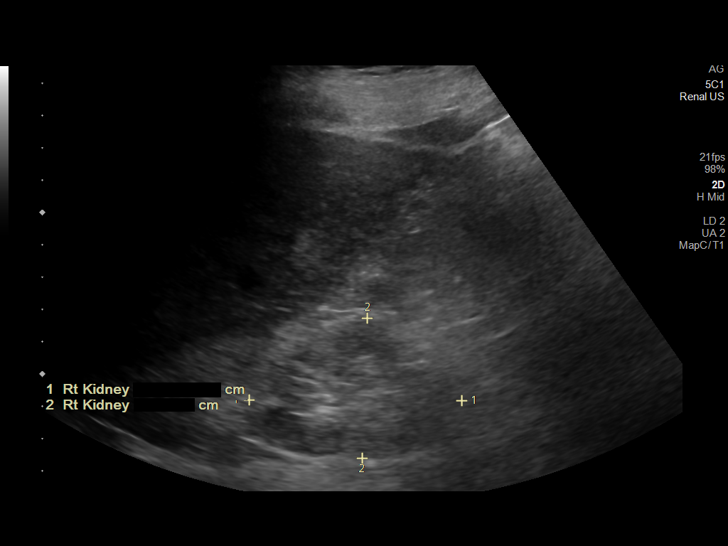
[im 9/21]
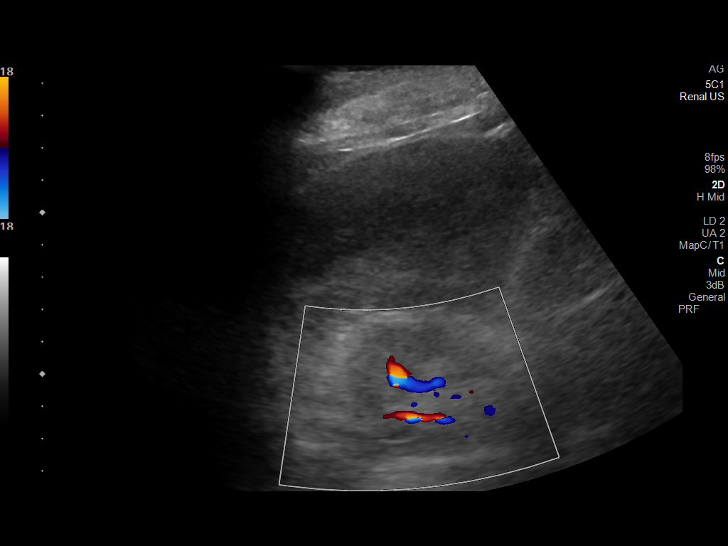
[im 10/21]
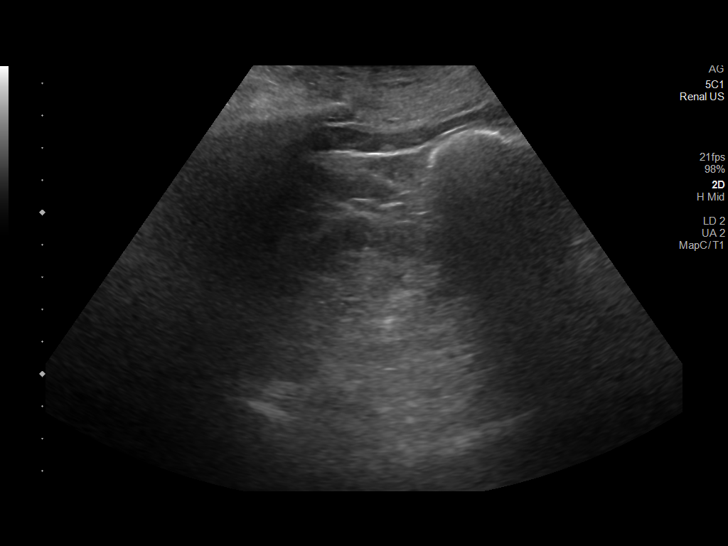
[im 12/21]
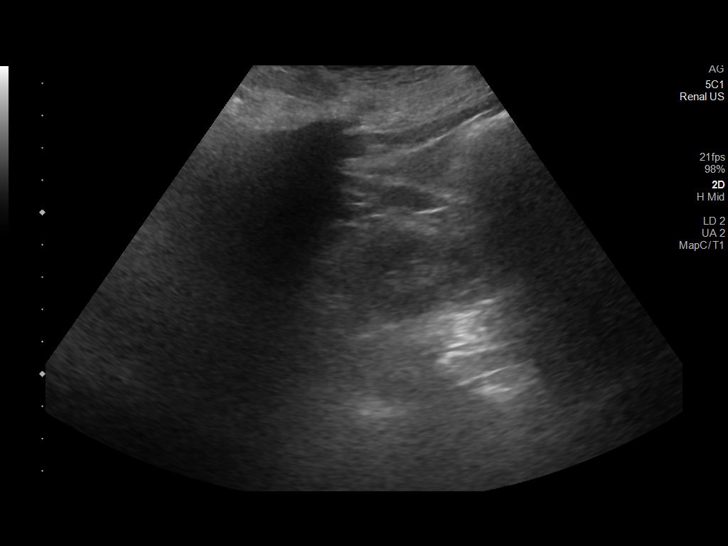
[im 13/21]
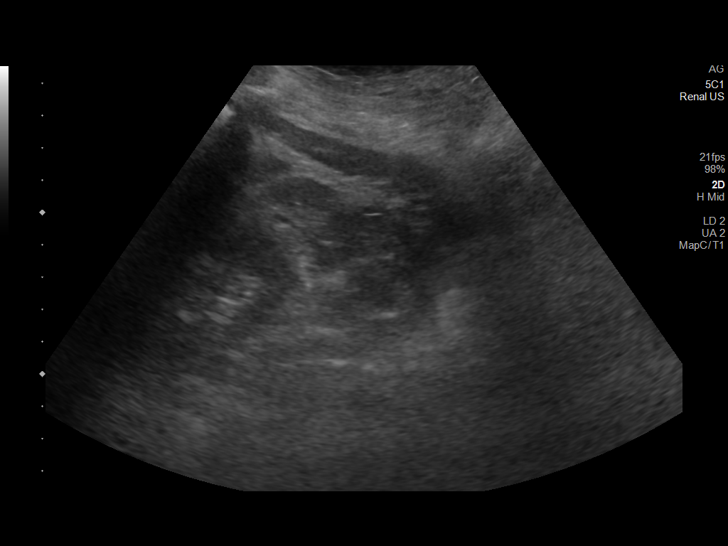
[im 15/21]
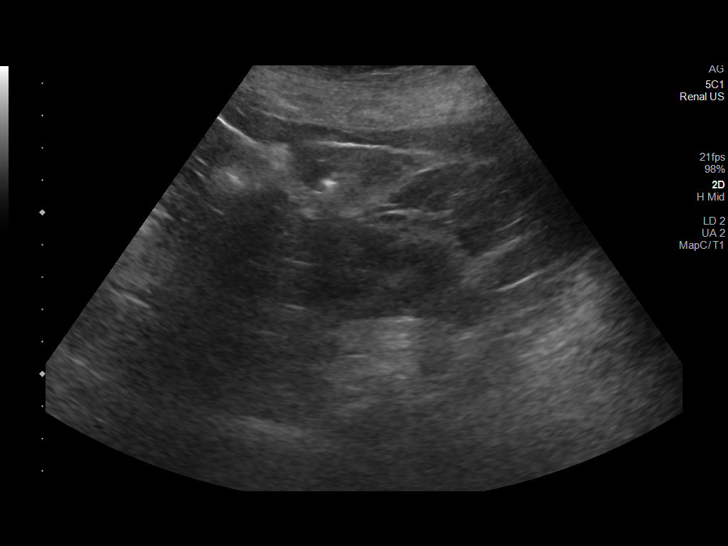
[im 16/21]
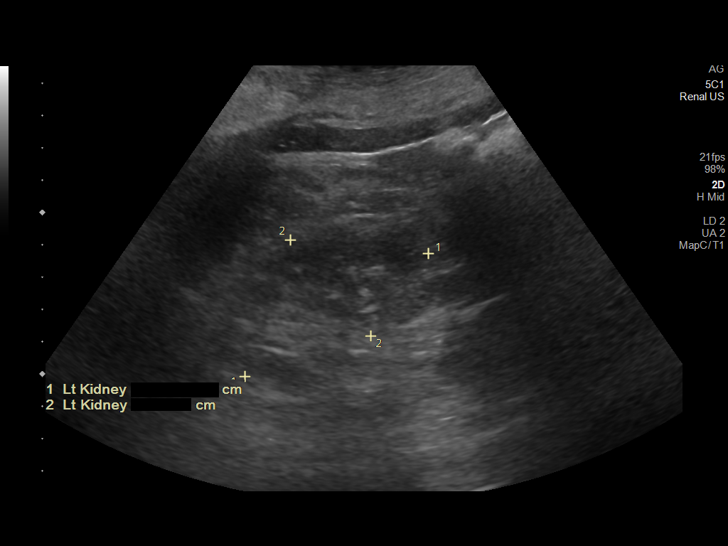
[im 18/21]
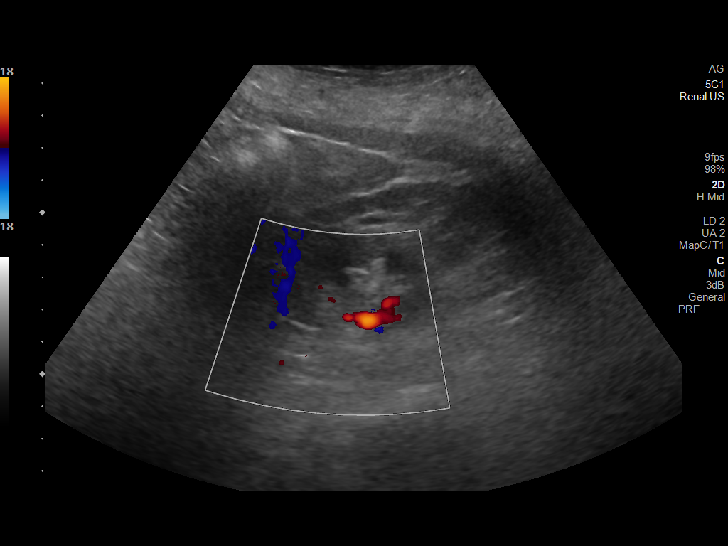
[im 19/21]
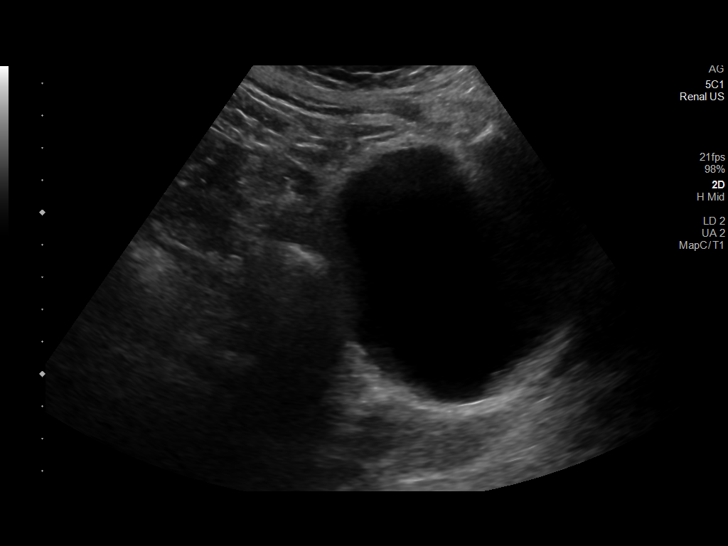
[im 21/21]
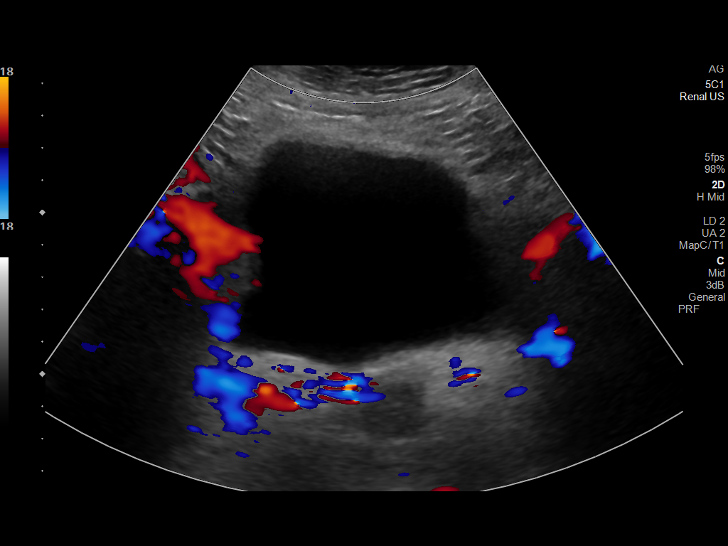

[14 of 21 positions shown; findings below may reference images not displayed]

FINDINGS: Right Kidney:

Renal measurements: 6.6 x 4.3 x 4.4 cm. = volume: 60 mL. Diffuse
increased echogenicity is noted. Cortical thinning is seen as well.
No mass lesion or hydronephrosis is noted.

Left Kidney:

Renal measurements: 6.8 x 3.9 x 3.2 cm. = volume: 46 mL. Increased
echogenicity is noted. No mass lesion or hydronephrosis is seen.

Bladder:

Appears normal for degree of bladder distention.

Other:

None.
IMPRESSION: Small kidneys with increased echogenicity consistent with chronic
medical renal disease. No obstructive changes are noted.

## 2020-03-02 IMAGING — CT CT CHEST W/O CM
2 of 4 series · 15 of 36 positions shown, 18 images · non-contrast
Comparison: Current and prior chest radiographs.

CLINICAL DATA: Altered mental status and slurred speech. Current
chest radiograph demonstrates interstitial opacities suspicious for
edema or atypical infection.

EXAM:
CT CHEST WITHOUT CONTRAST
TECHNIQUE: Multidetector CT imaging of the chest was performed following the
standard protocol without IV contrast.

[Series 3: chest w/o 2mm st · axial · non-contrast · 0.86mm/px · z∈[+1183,+1447]mm · 12 of 158 slices shown, 15 images]
[im 13/158  mediastinal]
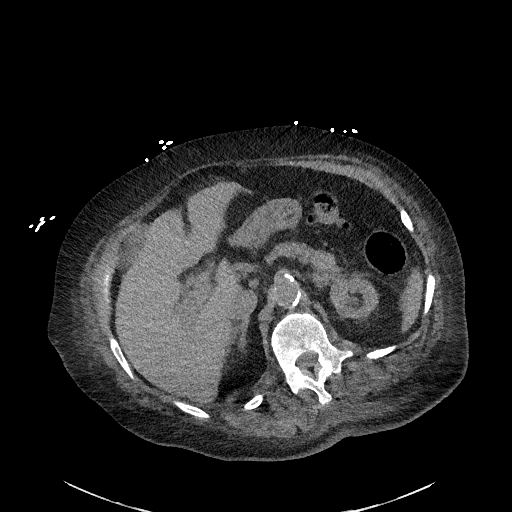
[im 13/158  lung]
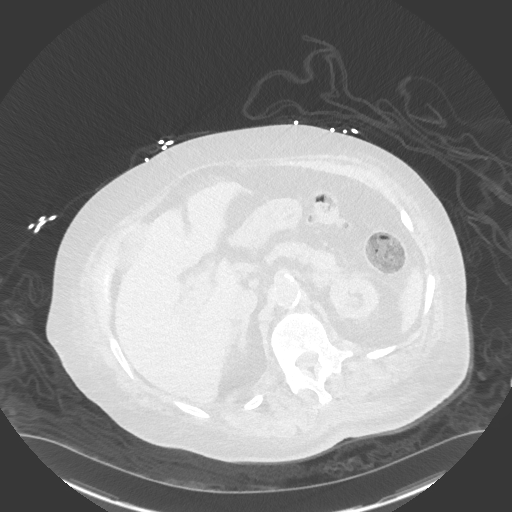
[im 25/158  lung]
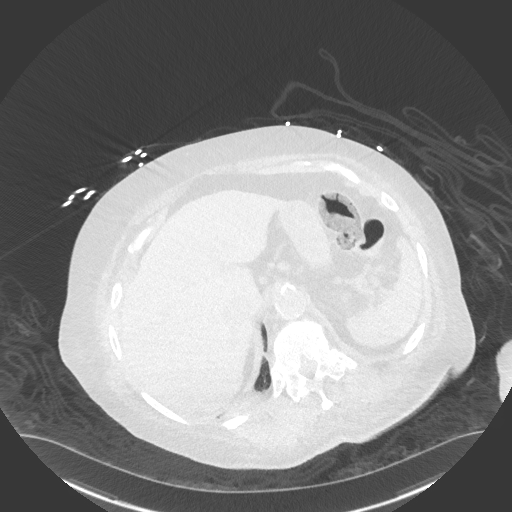
[im 37/158  lung]
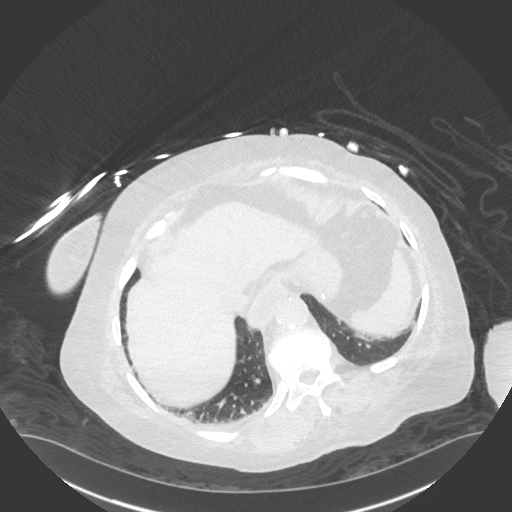
[im 49/158  lung]
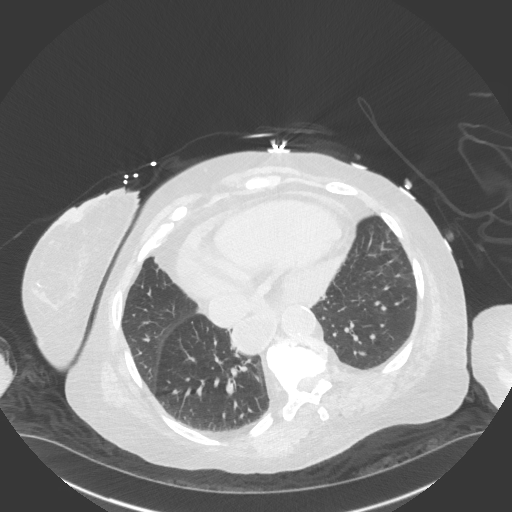
[im 61/158  mediastinal]
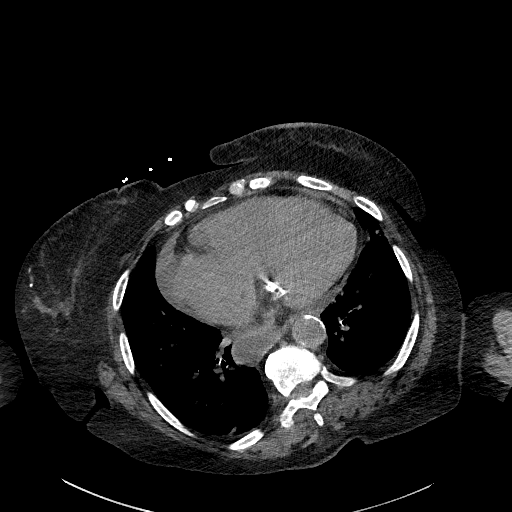
[im 61/158  lung]
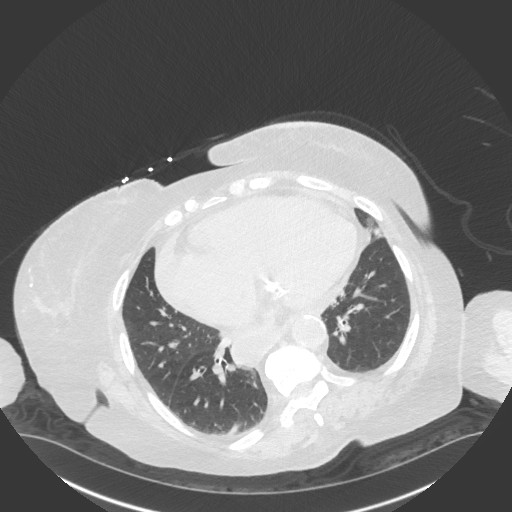
[im 73/158  lung]
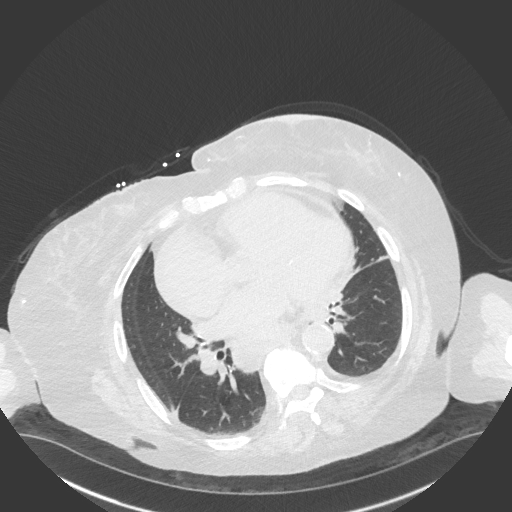
[im 85/158  lung]
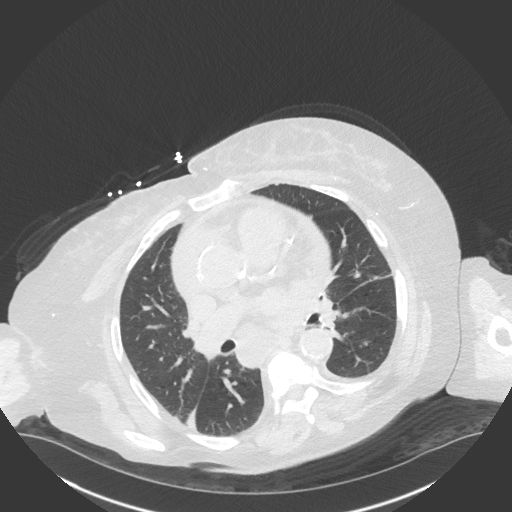
[im 97/158  lung]
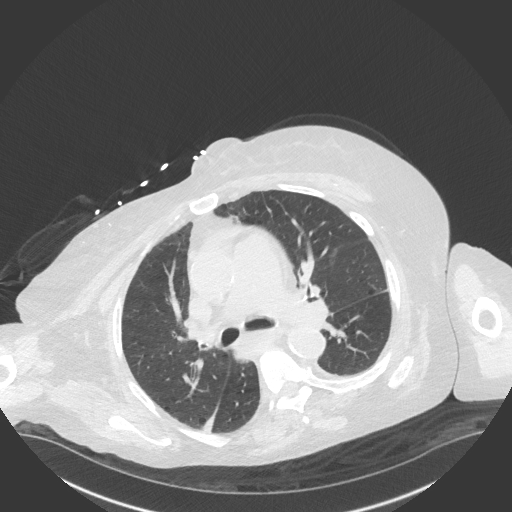
[im 109/158  mediastinal]
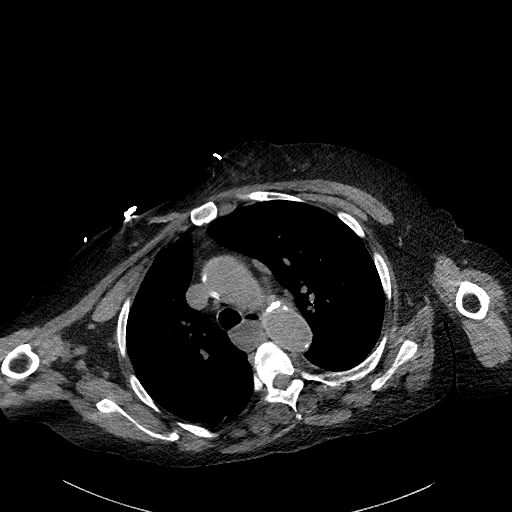
[im 109/158  lung]
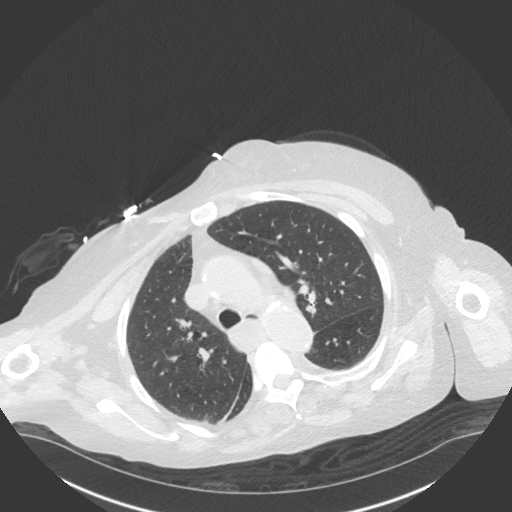
[im 121/158  lung]
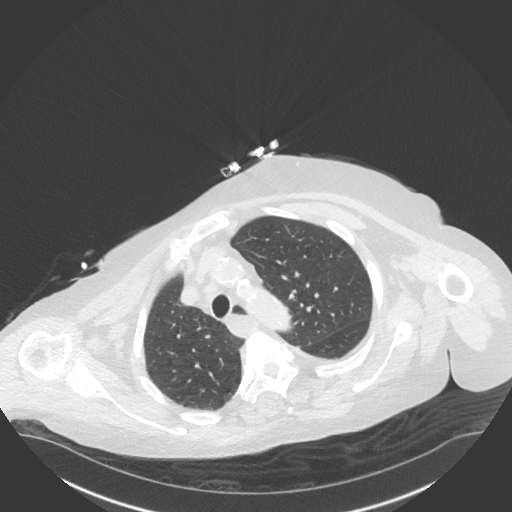
[im 133/158  lung]
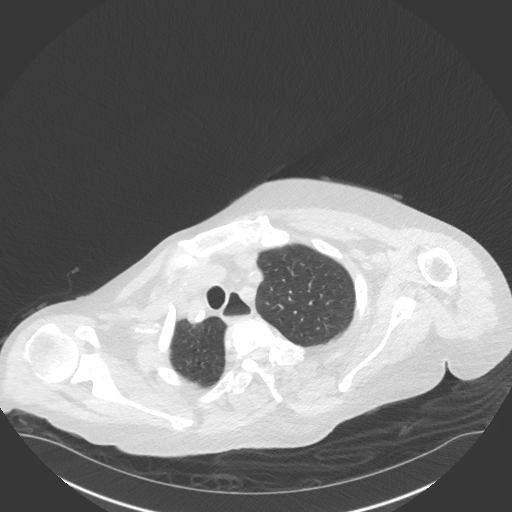
[im 145/158  lung]
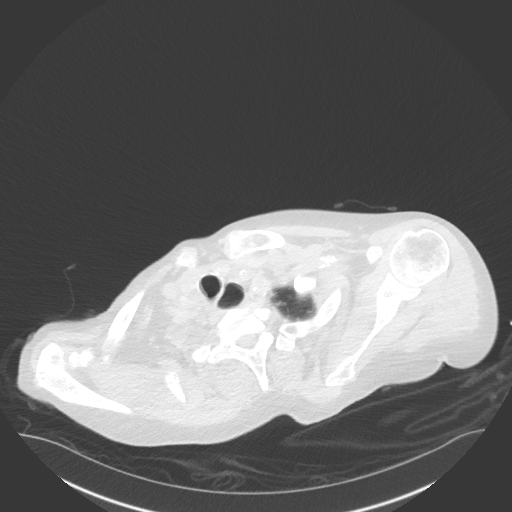

[Series 6: chest w/o 2mm st cor · coronal · non-contrast · 0.62mm/px · 3 of 143 slices shown]
[im 29/143  lung]
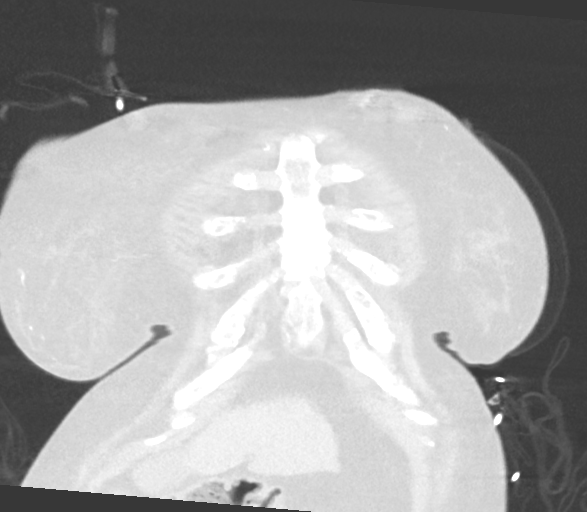
[im 57/143  lung]
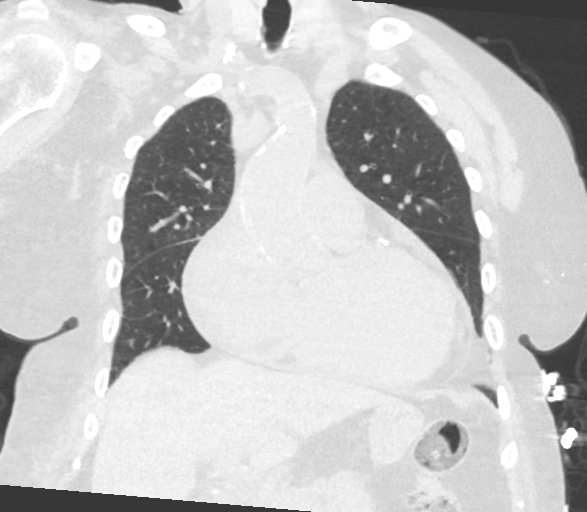
[im 86/143  lung]
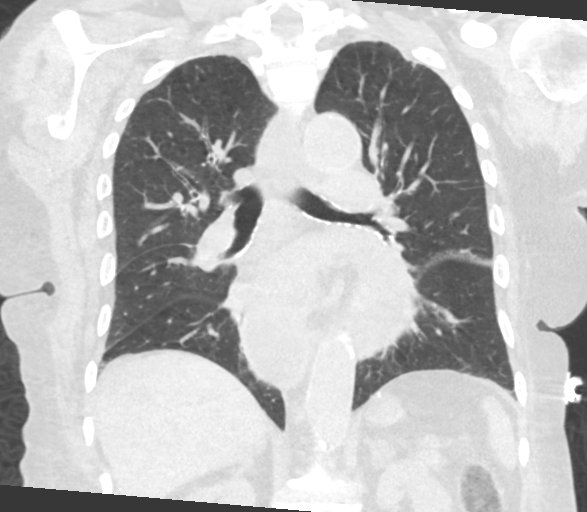

[15 of 36 positions shown; findings below may reference images not displayed]

FINDINGS: Cardiovascular: Mild cardiomegaly. Small pericardial effusion. Mild
2 vessel coronary artery calcifications. Aorta is normal in caliber.
There are aortic atherosclerotic calcifications. Main pulmonary
artery is dilated to 4.1 cm.

Mediastinum/Nodes: Prominent neck base lymph nodes, largest on the
left, 8 mm in short axis. There are prominent to mildly enlarged
mediastinal lymph nodes, largest 2 are right paratracheal, 1.3 and
1.2 cm in short axes. No mediastinal or hilar masses.

Esophagus is distended with fluid and nondependent air. No
esophageal mass or wall thickening. Trachea is unremarkable.

Lungs/Pleura: Mild linear and reticular opacities are noted at the
bases consistent with atelectasis and/or scarring. There are few
additional areas of peripheral reticular lung opacity consistent
with scarring most evident in the anterior upper lobes. No evidence
of pneumonia or pulmonary edema. No lung mass or suspicious nodule.
Mild centrilobular and paraseptal emphysema.

No pleural effusion or pneumothorax.

Upper Abdomen: Multiple gallstones. There is hazy opacity
surrounding the gallbladder. Wall is somewhat ill-defined.

Musculoskeletal: No fracture or acute finding. No osteoblastic or
osteolytic lesions. No chest wall masses.
IMPRESSION: 1. No evidence of pneumonia or pulmonary edema.
2. Cholelithiasis with hazy opacity surrounding the gallbladder
concerning for inflammation. Consider acute cholecystitis if there
are consistent clinical findings, which could be further assessed
with right upper quadrant ultrasound.
3. Mild cardiomegaly and small pericardial effusion.
4. Enlarged main pulmonary artery suggesting pulmonary artery
hypertension.
5. Mild emphysema.  Aortic atherosclerotic calcifications.

Aortic Atherosclerosis ([AO]-[AO]) and Emphysema ([AO]-[AO]).

## 2020-03-02 IMAGING — CT CT HEAD W/O CM
4 series · 16 of 47 positions shown, 18 images · non-contrast
Comparison: Brain MRI [DATE].

CLINICAL DATA: Mental status change, unknown cause. Additional
history provided: Slurred speech for 1 week.

EXAM:
CT HEAD WITHOUT CONTRAST
TECHNIQUE: Contiguous axial images were obtained from the base of the skull
through the vertex without intravenous contrast.

[Series 3: head without · axial · non-contrast · 0.44mm/px · z∈[-42,+78]mm · 7 of 34 slices shown, 9 images]
[im 5/34  brain]
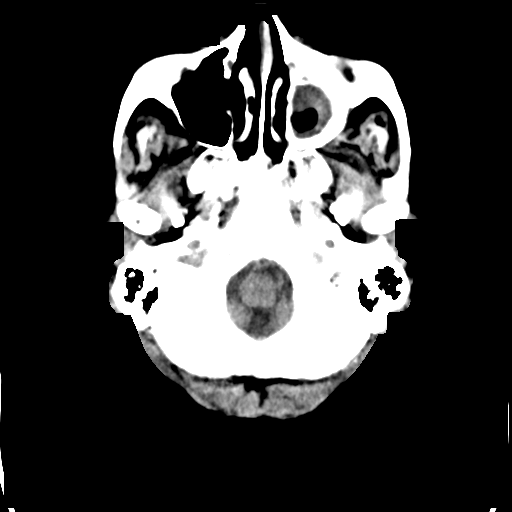
[im 5/34  bone]
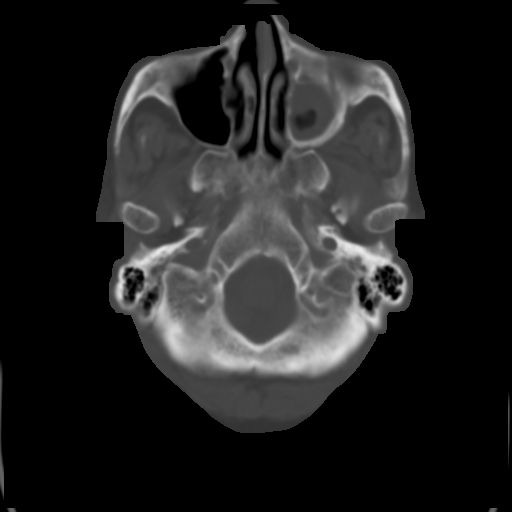
[im 9/34  brain]
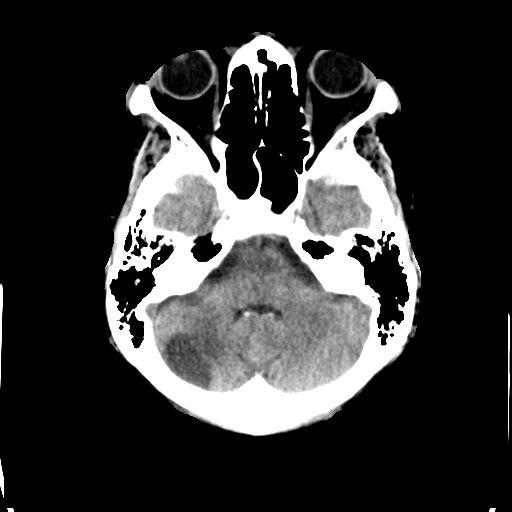
[im 13/34  brain]
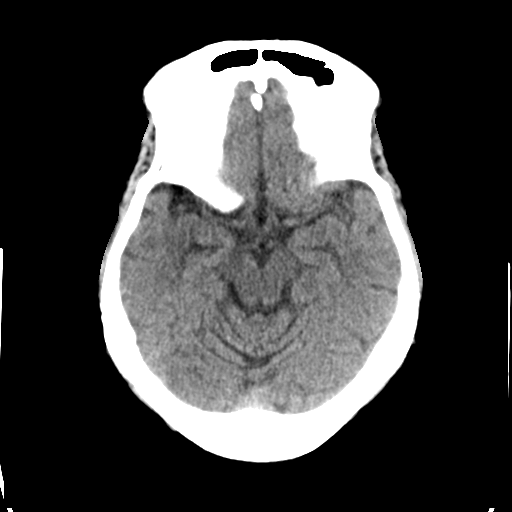
[im 17/34  brain]
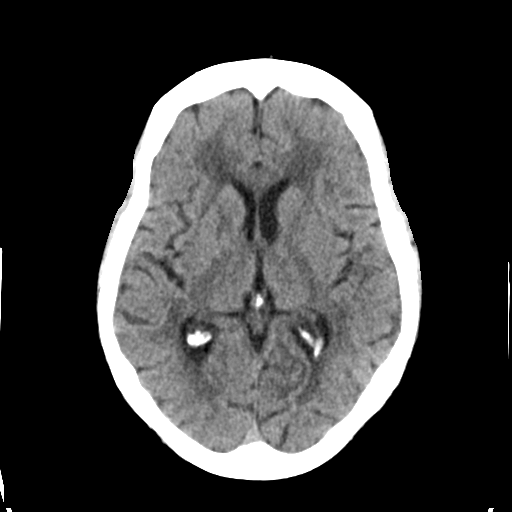
[im 21/34  brain]
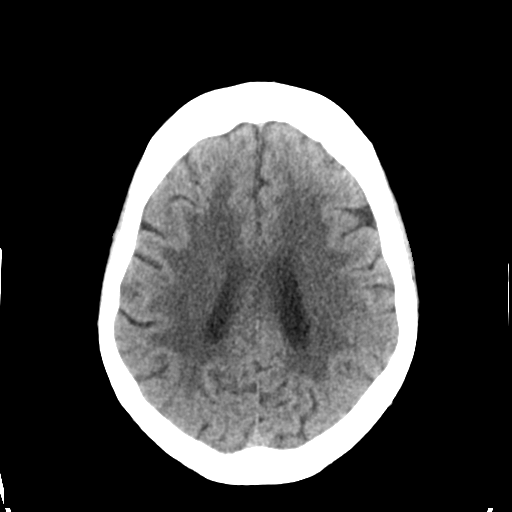
[im 21/34  bone]
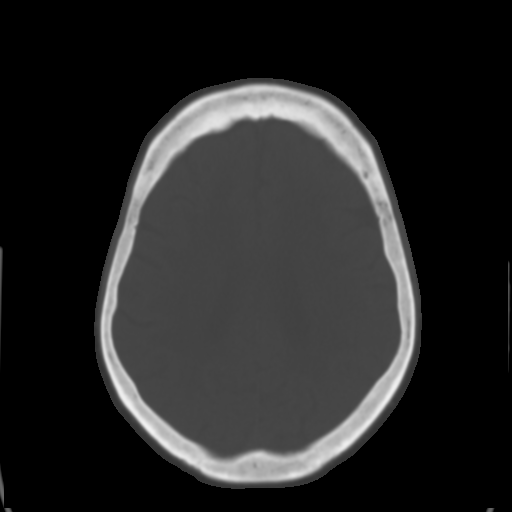
[im 25/34  brain]
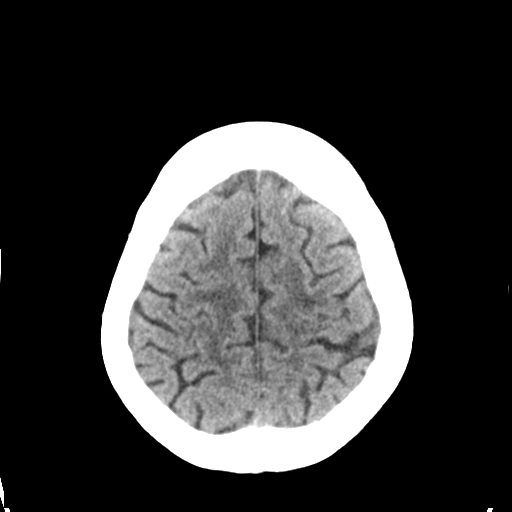
[im 29/34  brain]
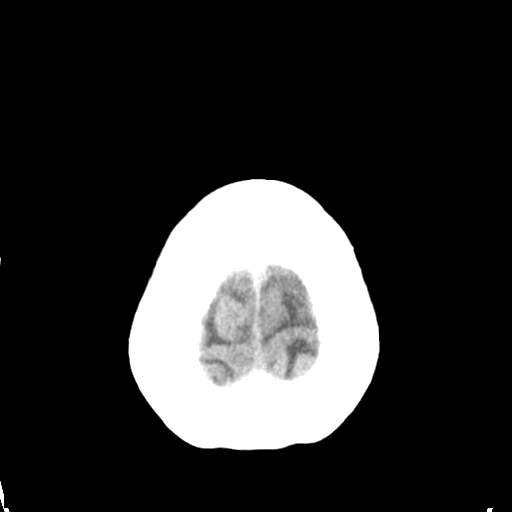

[Series 4: head bone · axial · 0.44mm/px · z∈[-46,-14]mm · 3 of 84 slices shown]
[im 9/84  bone]
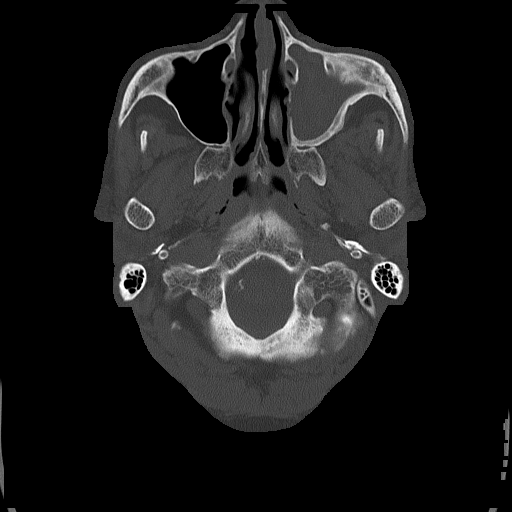
[im 17/84  bone]
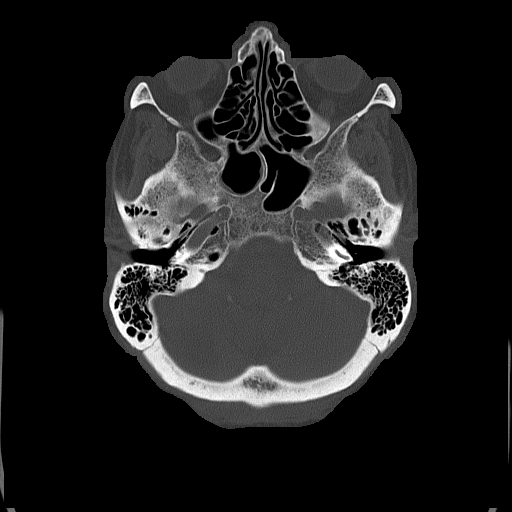
[im 25/84  bone]
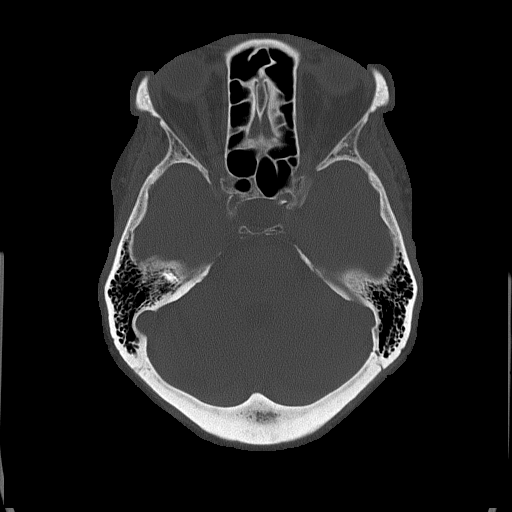

[Series 5: head without cor · coronal · non-contrast · 0.32mm/px · 3 of 65 slices shown]
[im 22/65  brain]
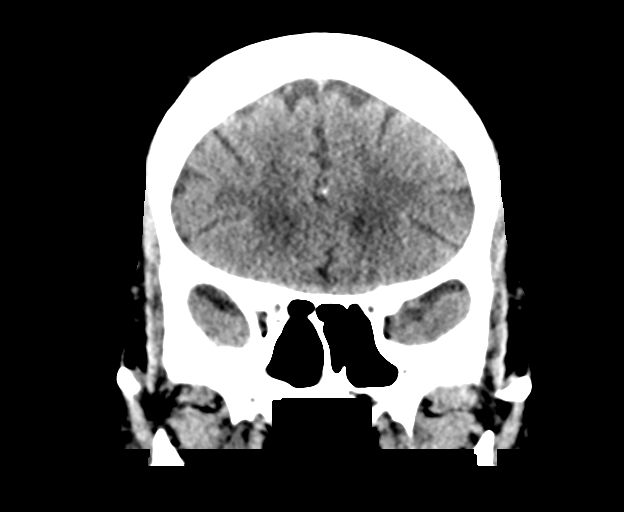
[im 29/65  brain]
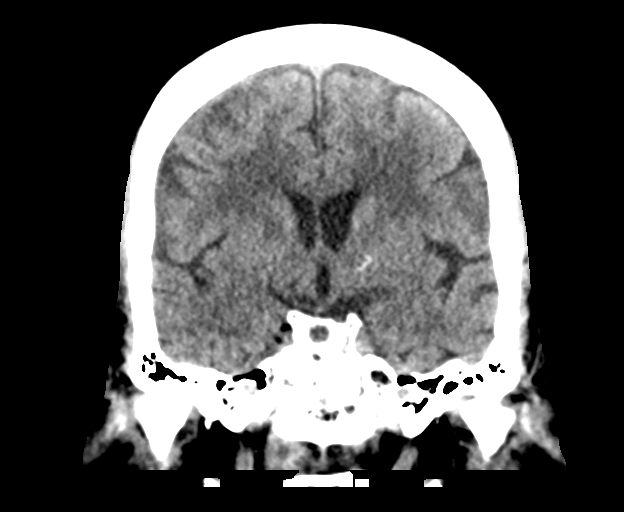
[im 36/65  brain]
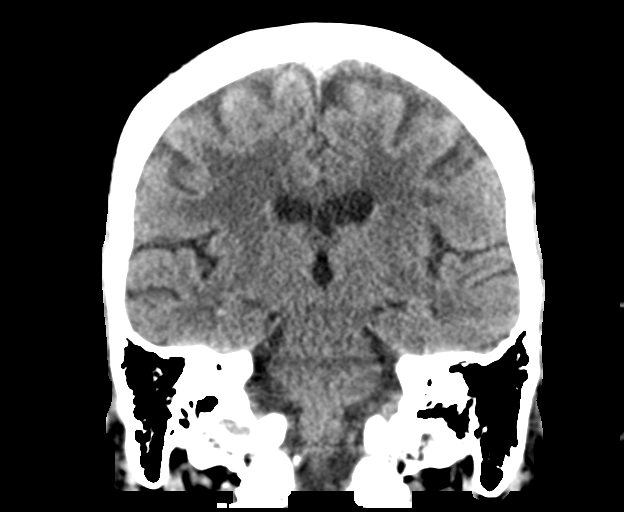

[Series 6: head without sag · sagittal · non-contrast · 0.32mm/px · 3 of 50 slices shown]
[im 17/50  brain]
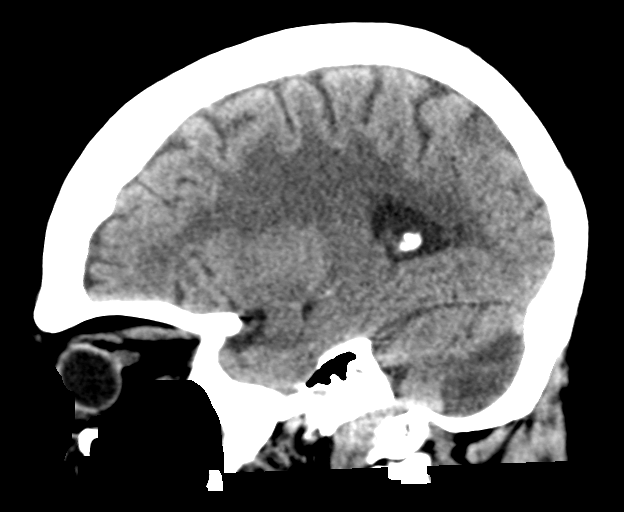
[im 25/50  brain]
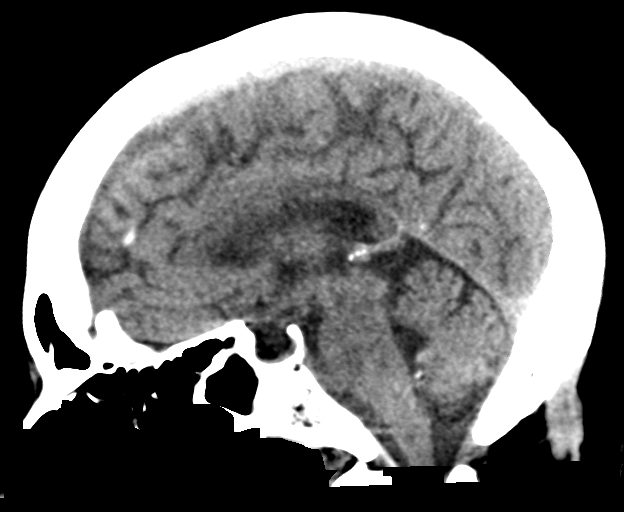
[im 33/50  brain]
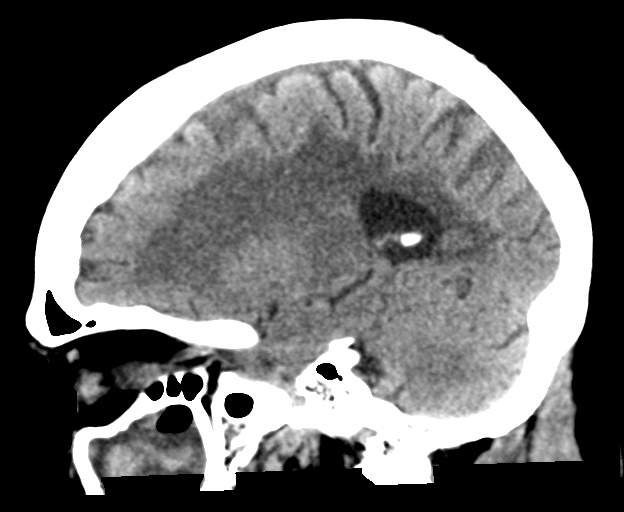

[16 of 47 positions shown; findings below may reference images not displayed]

FINDINGS: Brain:

Mild cerebral and cerebellar atrophy.

Advanced ill-defined hypoattenuation within the cerebral white
matter is nonspecific, but compatible with chronic small vessel
ischemic disease.

Redemonstrated chronic infarct within the inferior right cerebellar
hemisphere. Additional tiny chronic infarcts within the bilateral
cerebellar hemispheres were better appreciated on the prior MRI of
[DATE].

There is no acute intracranial hemorrhage.

No demarcated cortical infarct.

No extra-axial fluid collection.

No evidence of intracranial mass.

No midline shift.

Partially empty sella turcica.

Vascular: No hyperdense vessel.  Atherosclerotic calcifications.

Skull: Normal. Negative for fracture or focal lesion.

Sinuses/Orbits: Visualized orbits show no acute finding. As before,
there is near complete opacification of the left maxillary sinus
with associated chronic reactive osteitis.
IMPRESSION: No evidence of acute intracranial abnormality.

Mild atrophy of the brain and advanced cerebral white matter chronic
small vessel ischemic disease.

Redemonstrated chronic infarct within the inferior right cerebellar
hemisphere. Additional tiny chronic infarcts within the bilateral
cerebellar hemispheres were better appreciated on the prior MRI of
[DATE].

Severe chronic left maxillary sinusitis.

## 2020-03-02 MED ORDER — SODIUM BICARBONATE 650 MG PO TABS
650.0000 mg | ORAL_TABLET | Freq: Three times a day (TID) | ORAL | Status: DC
  Administered 2020-03-02 – 2020-03-05 (×7): 650 mg via ORAL
  Filled 2020-03-02 (×9): qty 1

## 2020-03-02 MED ORDER — SODIUM CHLORIDE 0.9 % IV SOLN
1.0000 g | Freq: Once | INTRAVENOUS | Status: AC
  Administered 2020-03-02: 1 g via INTRAVENOUS
  Filled 2020-03-02: qty 10

## 2020-03-02 MED ORDER — HEPARIN SODIUM (PORCINE) 5000 UNIT/ML IJ SOLN
5000.0000 [IU] | Freq: Three times a day (TID) | INTRAMUSCULAR | Status: DC
  Administered 2020-03-02 – 2020-03-05 (×7): 5000 [IU] via SUBCUTANEOUS
  Filled 2020-03-02 (×8): qty 1

## 2020-03-02 MED ORDER — SODIUM CHLORIDE 0.9 % IV SOLN: INTRAVENOUS | Status: DC

## 2020-03-02 MED ORDER — ADULT MULTIVITAMIN W/MINERALS CH
1.0000 | ORAL_TABLET | Freq: Every day | ORAL | Status: DC
  Administered 2020-03-02 – 2020-03-05 (×4): 1 via ORAL
  Filled 2020-03-02 (×4): qty 1

## 2020-03-02 MED ORDER — LORAZEPAM 2 MG/ML IJ SOLN
1.0000 mg | Freq: Once | INTRAMUSCULAR | Status: AC
  Administered 2020-03-02: 1 mg via INTRAVENOUS
  Filled 2020-03-02: qty 1

## 2020-03-02 MED ORDER — ASPIRIN EC 81 MG PO TBEC
81.0000 mg | DELAYED_RELEASE_TABLET | Freq: Every day | ORAL | Status: DC
  Administered 2020-03-02 – 2020-03-05 (×4): 81 mg via ORAL
  Filled 2020-03-02 (×4): qty 1

## 2020-03-02 MED ORDER — ATORVASTATIN CALCIUM 40 MG PO TABS
40.0000 mg | ORAL_TABLET | Freq: Every day | ORAL | Status: DC
  Administered 2020-03-03 – 2020-03-04 (×2): 40 mg via ORAL
  Filled 2020-03-02 (×2): qty 1

## 2020-03-02 MED ORDER — AMLODIPINE BESYLATE 10 MG PO TABS
10.0000 mg | ORAL_TABLET | Freq: Every day | ORAL | Status: DC
  Administered 2020-03-02 – 2020-03-05 (×4): 10 mg via ORAL
  Filled 2020-03-02: qty 2
  Filled 2020-03-02 (×3): qty 1

## 2020-03-02 MED ORDER — METOPROLOL TARTRATE 50 MG PO TABS
50.0000 mg | ORAL_TABLET | Freq: Two times a day (BID) | ORAL | Status: DC
  Administered 2020-03-02 – 2020-03-05 (×6): 50 mg via ORAL
  Filled 2020-03-02 (×5): qty 1
  Filled 2020-03-02: qty 2

## 2020-03-02 NOTE — H&P (Addendum)
History and Physical  [name] HQI:[id number] DOB: 15-Oct-1945 DOA: 03/02/2020  Referring physician: Dr. Ron Parker PCP: [name], MD  Outpatient Specialists: Nephrology Patient coming from: Home.  Chief Complaint: Confusion, agitation, aggressive  HPI: Stephanie Griffith is a 74 y.o. female with medical history significant for CKD V, chronic diastolic CHF, peripheral artery disease status post left above the knee amputation, hyperlipidemia, essential hypertension, polyneuropathy who presented to [facility] ED from home due to altered mental status, agitation, and aggressive behavior x1 week progressively worsening.  Family concerned about possible UTI.  Still confused at the time of this visit.  Unable to obtain a history.  History mainly obtained from EDP and review of medical records.  Work-up in the ED revealed possible UTI and AKI on CKD V.  MRI brain done on 03/02/2020 showing no acute infarction, hemorrhage or mass.  Chronic cerebral infarcts.  Moderate to advanced chronic microvascular ischemic changes.  TRH, hospitalist team, asked to admit.  ED Course:  Elevated BP, afebrile.  Lab studies remarkable for no leukocytosis, hemoglobin down to 8.5K from 9.9 at baseline, BUN 47, creatinine 5.3 with baseline creatinine of 3.9.  BNP greater than 1400.  Mild edema on chest x-ray.  Review of Systems: Review of systems as noted in the HPI. All other systems reviewed and are negative.   Past Medical History:  Diagnosis Date  . Anemia of chronic disease   . Chronic diastolic CHF (congestive heart failure) ([place])   . Chronic renal insufficiency, stage IV (severe) ([place])   . COPD (chronic obstructive pulmonary disease) ([place])   . Hyperglycemia   . Hyperlipidemia   . Hypertension   . PAD (peripheral artery disease) ([place])    a. s/p femoral to below knee popliteal bypass then L AKA.  Marland Kitchen PAF (paroxysmal atrial fibrillation) ([place])   . PAT (paroxysmal atrial tachycardia) ([place])   . Pulmonary hypertension ([place])    . Umbilical hernia    Past Surgical History:  Procedure Laterality Date  . AMPUTATION  06/06/2011   Procedure: AMPUTATION DIGIT;  Surgeon: Elam Dutch, MD;  Location[facility] OR;  Service: Vascular;  Laterality: Left;  Transmetatarsal amputation left great toe  . AMPUTATION  06/12/2011   Procedure: AMPUTATION ABOVE KNEE;  Surgeon: Elam Dutch, MD;  Location: [place];  Service: Vascular;  Laterality: Left;  . FEMORAL-POPLITEAL BYPASS GRAFT  06/06/2011   Procedure: BYPASS GRAFT FEMORAL-POPLITEAL ARTERY;  Surgeon: Elam Dutch, MD;  Location: [facility] OR;  Service: Vascular;  Laterality: Left;  left femoral to popliteal bypass with composite vein and propaten 58mm x 80 graft  . LOWER EXTREMITY ANGIOGRAM N/A 06/04/2011   Procedure: LOWER EXTREMITY ANGIOGRAM;  Surgeon: Angelia Mould, MD;  Location: [facility] CATH LAB;  Service: Cardiovascular;  Laterality: N/A;  . OMENTECTOMY  12/31/2015   Procedure: Partial OMENTECTOMY;  Surgeon: Georganna Skeans, MD;  Location: [place];  Service: General;;  . RIGHT HEART CATH N/A 02/13/2019   Procedure: RIGHT HEART CATH;  Surgeon: Jolaine Artist, MD;  Location: [place] CV LAB;  Service: Cardiovascular;  Laterality: N/A;  . TUBAL LIGATION    . VENTRAL HERNIA REPAIR N/A 12/31/2015   Procedure: REPAIR INCARCERATED VENTRAL HERNIA, POSSIBLE BOWEL RESECTION;  Surgeon: Georganna Skeans, MD;  Location: [place];  Service: General;  Laterality: N/A;    Social History:  reports that she quit smoking about 8 years ago. She has a 100.00 pack-year smoking history. She has never used smokeless tobacco. She reports that she does not  drink alcohol and does not use drugs.   Allergies  Allergen Reactions  . Morphine And Related Other (See Comments)    AMS    Family History  Problem Relation Age of Onset  . Heart attack Father   . Heart disease Father   . Diabetes Mother   . Hypertension Mother   . Heart attack Brother       Prior to Admission medications   Medication  Sig Start Date End Date Taking? Authorizing Provider  amLODipine (NORVASC) 10 MG tablet Take 10 mg by mouth daily.  07/08/14  Yes [provider]  aspirin 81 MG EC tablet Take 81 mg by mouth daily.     Yes [provider]  atorvastatin (LIPITOR) 40 MG tablet Take 1 tablet (40 mg total) by mouth daily at 6 PM. 02/18/19 03/02/20 Yes Ghimire, Dante Gang, MD  furosemide (LASIX) 40 MG tablet Take 1 tablet (40 mg total) by mouth daily. Patient taking differently: Take 40 mg by mouth 2 (two) times daily.  02/19/19  Yes Barb Merino, MD  gabapentin (NEURONTIN) 100 MG capsule Take 100 mg by mouth 2 (two) times daily.  06/23/14  Yes [provider]  metoprolol (LOPRESSOR) 50 MG tablet Take 1 tablet (50 mg total) by mouth 2 (two) times daily. 01/10/16  Yes Domenic Polite, MD  Multiple Vitamin (MULTIVITAMIN WITH MINERALS) TABS tablet Take 1 tablet by mouth daily.   Yes [provider]  sodium bicarbonate 650 MG tablet Take 650 mg by mouth in the morning, at noon, and at bedtime. 12/29/19  Yes [provider]    Physical Exam: BP (!) 164/79   Pulse 81   Temp 98.6 F (37 C) (Oral)   Resp 19   SpO2 99%   . General: 74 y.o. year-old female well developed well nourished in no acute distress.  Alert and confused. . Cardiovascular: Regular rate and rhythm with no rubs or gallops.  No thyromegaly or JVD noted.  No lower extremity edema. 2/4 pulses in all 4 extremities. Marland Kitchen Respiratory: Clear to auscultation with no wheezes or rales. Good inspiratory effort. . Abdomen: Soft nontender nondistended with normal bowel sounds x4 quadrants. . Muskuloskeletal: Left AKA . Neuro: CN II-XII intact, strength, sensation, reflexes . Skin: No ulcerative lesions noted or rashes . Psychiatry: Judgement and insight appear normal. Mood is appropriate for condition and setting          Labs on Admission:  Basic Metabolic Panel: Recent Labs  Lab 03/02/20 0850  NA 145  K 4.1  CL 113*   CO2 20*  GLUCOSE 89  BUN 47*  CREATININE 5.38*  CALCIUM 8.5*   Liver Function Tests: Recent Labs  Lab 03/02/20 0850  AST 25  ALT 14  ALKPHOS 106  BILITOT 0.8  PROT 7.6  ALBUMIN 2.6*   No results for input(s): LIPASE, AMYLASE in the last 168 hours. No results for input(s): AMMONIA in the last 168 hours. CBC: Recent Labs  Lab 03/02/20 0850  WBC 9.6  HGB 8.5*  HCT 28.8*  MCV 98.3  PLT 253   Cardiac Enzymes: No results for input(s): CKTOTAL, CKMB, CKMBINDEX, TROPONINI in the last 168 hours.  BNP (last 3 results) Recent Labs    03/02/20 0850  BNP 1,490.5*    ProBNP (last 3 results) No results for input(s): PROBNP in the last 8760 hours.  CBG: No results for input(s): GLUCAP in the last 168 hours.  Radiological Exams on Admission: CT Head Wo Contrast  Result  Date: 03/02/2020 CLINICAL DATA:  Mental status change, unknown cause. Additional history provided: Slurred speech for 1 week. EXAM: CT HEAD WITHOUT CONTRAST TECHNIQUE: Contiguous axial images were obtained from the base of the skull through the vertex without intravenous contrast. COMPARISON:  Brain MRI 04/07/2019. FINDINGS: Brain: Mild cerebral and cerebellar atrophy. Advanced ill-defined hypoattenuation within the cerebral white matter is nonspecific, but compatible with chronic small vessel ischemic disease. Redemonstrated chronic infarct within the inferior right cerebellar hemisphere. Additional tiny chronic infarcts within the bilateral cerebellar hemispheres were better appreciated on the prior MRI of 04/07/2019. There is no acute intracranial hemorrhage. No demarcated cortical infarct. No extra-axial fluid collection. No evidence of intracranial mass. No midline shift. Partially empty sella turcica. Vascular: No hyperdense vessel.  Atherosclerotic calcifications. Skull: Normal. Negative for fracture or focal lesion. Sinuses/Orbits: Visualized orbits show no acute finding. As before, there is near complete  opacification of the left maxillary sinus with associated chronic reactive osteitis. IMPRESSION: No evidence of acute intracranial abnormality. Mild atrophy of the brain and advanced cerebral white matter chronic small vessel ischemic disease. Redemonstrated chronic infarct within the inferior right cerebellar hemisphere. Additional tiny chronic infarcts within the bilateral cerebellar hemispheres were better appreciated on the prior MRI of 04/07/2019. Severe chronic left maxillary sinusitis. Electronically Signed   By: Kellie Simmering DO   On: 03/02/2020 10:32   CT Chest Wo Contrast  Result Date: 03/02/2020 CLINICAL DATA:  Altered mental status and slurred speech. Current chest radiograph demonstrates interstitial opacities suspicious for edema or atypical infection. EXAM: CT CHEST WITHOUT CONTRAST TECHNIQUE: Multidetector CT imaging of the chest was performed following the standard protocol without IV contrast. COMPARISON:  Current and prior chest radiographs. FINDINGS: Cardiovascular: Mild cardiomegaly. Small pericardial effusion. Mild 2 vessel coronary artery calcifications. Aorta is normal in caliber. There are aortic atherosclerotic calcifications. Main pulmonary artery is dilated to 4.1 cm. Mediastinum/Nodes: Prominent neck base lymph nodes, largest on the left, 8 mm in short axis. There are prominent to mildly enlarged mediastinal lymph nodes, largest 2 are right paratracheal, 1.3 and 1.2 cm in short axes. No mediastinal or hilar masses. Esophagus is distended with fluid and nondependent air. No esophageal mass or wall thickening. Trachea is unremarkable. Lungs/Pleura: Mild linear and reticular opacities are noted at the bases consistent with atelectasis and/or scarring. There are few additional areas of peripheral reticular lung opacity consistent with scarring most evident in the anterior upper lobes. No evidence of pneumonia or pulmonary edema. No lung mass or suspicious nodule. Mild centrilobular and  paraseptal emphysema. No pleural effusion or pneumothorax. Upper Abdomen: Multiple gallstones. There is hazy opacity surrounding the gallbladder. Wall is somewhat ill-defined. Musculoskeletal: No fracture or acute finding. No osteoblastic or osteolytic lesions. No chest wall masses. IMPRESSION: 1. No evidence of pneumonia or pulmonary edema. 2. Cholelithiasis with hazy opacity surrounding the gallbladder concerning for inflammation. Consider acute cholecystitis if there are consistent clinical findings, which could be further assessed with right upper quadrant ultrasound. 3. Mild cardiomegaly and small pericardial effusion. 4. Enlarged main pulmonary artery suggesting pulmonary artery hypertension. 5. Mild emphysema.  Aortic atherosclerotic calcifications. Aortic Atherosclerosis (ICD10-I70.0) and Emphysema (ICD10-J43.9). Electronically Signed   By: Lajean Manes M.D.   On: 03/02/2020 13:45   MR Brain Wo Contrast (neuro protocol)  Result Date: 03/02/2020 CLINICAL DATA:  Mental status change EXAM: MRI HEAD WITHOUT CONTRAST TECHNIQUE: Multiplanar, multiecho pulse sequences of the brain and surrounding structures were obtained without intravenous contrast. COMPARISON:  04/07/2019 FINDINGS: Brain: There is no acute  infarction or intracranial hemorrhage. Moderate size chronic right cerebellar infarct. Additional tiny bilateral chronic cerebellar infarcts. Patchy and confluent areas of T2 hyperintensity supratentorial white matter are nonspecific but probably reflect stable moderate to advanced chronic microvascular ischemic changes. There is no intracranial mass, mass effect, or edema. There is no hydrocephalus or extra-axial fluid collection. Ventricles and sulci are stable in size and configuration. Vascular: Major vessel flow voids at the skull base are preserved. Skull and upper cervical spine: Normal marrow signal is preserved. Sinuses/Orbits: Chronic left maxillary sinusitis with persistent circumferential  mucosal thickening. Minor mucosal thickening elsewhere. Orbits are unremarkable. Other: Incidental note is made of partial empty sella. Mastoid air cells are clear. IMPRESSION: No acute infarction, hemorrhage, or mass. Chronic cerebellar infarcts. Moderate to advanced chronic microvascular ischemic changes. Electronically Signed   By: Macy Mis M.D.   On: 03/02/2020 18:19   DG Chest Port 1 View  Result Date: 03/02/2020 CLINICAL DATA:  Altered mental status EXAM: PORTABLE CHEST 1 VIEW COMPARISON:  Chest radiograph February 05, 2019 FINDINGS: Cardiomegaly similar priors. Left greater than right interstitial opacities. No visible pneumothorax or pleural effusion. No acute osseous abnormality. IMPRESSION: Left greater than right interstitial opacities, which may reflect edema or atypical infection. Electronically Signed   By: Dahlia Bailiff MD   On: 03/02/2020 10:02    EKG: I independently viewed the EKG done and my findings are as followed: Sinus rhythm rate of 76 with no ST-T changes.  Assessment/Plan Present on Admission: . AMS (altered mental status)  Active Problems:   AMS (altered mental status)  Acute metabolic encephalopathy, suspect multifactorial secondary to acute illness, dehydration, possible UTI Presented with gradually worsening altered mental status of 1 week duration.  Associated with aggressive behavior and agitation UA, mildly positive for pyuria, urine culture pending, follow results. Started on Rocephin empirically in the ED, continue MRI brain nonacute.  Showing chronic cerebral infarction. Delirium, aspiration, fall precautions Reorient as needed  Possible UTI UA slightly positive for pyuria Follow urine culture Started on Rocephin empirically in the ED, continue DC IV antibiotics if urine culture returns negative.  AKI on CKD V Baseline creatinine appears to be 3.5 with GFR of 14 Presented with creatinine of 5.3 with GFR of 8 Start gentle IV fluid  hydration Obtain renal ultrasound Avoid nephrotoxins, hypotension, dehydration Monitor urine output Daily renal panel  Chronic diastolic CHF Euvolemic on exam BNP elevated 1490 Currently on gentle IV hydration normal saline at 50 cc/h close monitoring Hold off oral Lasix Closely monitor volume status  Essential hypertension BP is not controlled Resume home oral antihypertensives IV hydralazine as needed with parameters  Chronic metabolic acidosis in the setting of advanced CKD Resume home oral sodium bicarbonate Serum bicarb on presentation 20, anion gap of 12 Repeat renal panel  Polyneuropathy Hold off on gabapentin for now due to acute encephalopathy   DVT prophylaxis: Subcu heparin 3 times daily  Code Status: Full code per EDP Dr. Ron Parker  Family Communication: None at bedside.  Disposition Plan: Admit to telemetry medical  Consults called: None.  Consult nephrology if no improvement of renal function.  Admission status: Inpatient status.  Patient will require at least 2 midnights for further evaluation and management of current presentation.   Status is: Inpatient    Dispo:  Patient From: Home  Planned Disposition: Home  Expected discharge date: 03/04/20  Medically stable for discharge: No, ongoing management of acute metabolic encephalopathy and AKI on CKD V.  Kayleen Memos MD [facility] Pager [phone]  If 7PM-7AM, please contact night-coverage [email or web address] Password [email or web address]  03/02/2020, 9:02 PM

## 2020-03-02 NOTE — ED Provider Notes (Signed)
Medical Decision Making: Care of patient assumed from Stephanie Griffith at [phone].  Agree with history, physical exam and plan.  See their note for further details.  Briefly, The pt p/w AMS, CT head neg, IVF given, AKI on labs.   Current plan is as follows: UA pending MR pending  MRI negative for acute ischemic disease, chronic changes seen.  Family is aware.  Waiting for ua for eval of uti  I reviewed the urine studies that shows leukocyte esterase rare bacteria, no nitrites, not necessarily convincing for UTI however with the altered mental status and these findings we will treat with Rocephin, culture pending.  With worsening creatinine, she likely needs admission for further observation concerned she is still altered from baseline.  I personally reviewed and interpreted all labs/imaging.      Stephanie Coons, MD 03/02/20 2030

## 2020-03-02 NOTE — ED Notes (Signed)
Daughter would like an update [phone] [facility]

## 2020-03-02 NOTE — ED Notes (Signed)
Pt had episode of urinary incontinence. Set up with [facility] to collect UA.

## 2020-03-02 NOTE — ED Notes (Signed)
Pt eating dinner

## 2020-03-02 NOTE — ED Notes (Addendum)
Pt brought back from MRI  Updated daughter Mickie Kay [phone]

## 2020-03-02 NOTE — ED Notes (Signed)
Off floor to MRI

## 2020-03-02 NOTE — ED Provider Notes (Signed)
[facility] EMERGENCY DEPARTMENT Provider Note   CSN: [id number] Arrival date & time: 03/02/20  9702     History Chief Complaint  Patient presents with  . Altered Mental Status    Stephanie Griffith is a 74 y.o. female.  Patient lives with family member.  Patient brought in by EMS for altered mental status slurred speech for the past week.  EMS reported that baseline alert and oriented x4.  Patient uses a wheelchair.  She has had a left-sided above-the-knee amputation.  Past medical history is significant for chronic diastolic congestive heart failure chronic renal insufficiency stage IV severe.  But not on dialysis.  COPD.  Hyperglycemia hyperlipidemia hypertension.  History of atrial fibrillation.  Pulmonary hypertension.        Past Medical History:  Diagnosis Date  . Anemia of chronic disease   . Chronic diastolic CHF (congestive heart failure) ([place])   . Chronic renal insufficiency, stage IV (severe) ([place])   . COPD (chronic obstructive pulmonary disease) ([place])   . Hyperglycemia   . Hyperlipidemia   . Hypertension   . PAD (peripheral artery disease) ([place])    a. s/p femoral to below knee popliteal bypass then L AKA.  Marland Kitchen PAF (paroxysmal atrial fibrillation) ([place])   . PAT (paroxysmal atrial tachycardia) ([place])   . Pulmonary hypertension ([place])   . Umbilical hernia     Patient Active Problem List   Diagnosis Date Noted  . Acute on chronic diastolic CHF (congestive heart failure) ([place]) 02/06/2019  . Chronic renal insufficiency, stage IV (severe) ([place]) 02/06/2019  . Metabolic acidosis 63/78/5885  . Pressure injury of skin 01/03/2016  . Acute hypoxemic respiratory failure ([place])   . Acute pulmonary edema ([facility])   . Hyperkalemia 12/30/2015  . Incarcerated hernia 12/30/2015  . Phantom limb pain ([place]) 08/13/2011  . PVD (peripheral vascular disease) with claudication ([place]) 07/26/2011  . S/P AKA (above knee amputation) ([place]) 06/15/2011  . NSTEMI (non-ST elevated  myocardial infarction) ([place]) 06/09/2011  . Acute diastolic heart failure ([place]) 06/09/2011  . Atrial fibrillation ([place]) 06/08/2011  . Cholelithiasis and cholecystitis without obstruction 06/03/2011  . Ventral hernia 06/03/2011  . Acute on chronic renal failure ([place]) 06/02/2011  . PAD (peripheral artery disease) ([place]) 06/02/2011  . 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 Abdominal pain 05/31/2011  . Leukocytosis 05/31/2011  . Hyponatremia 05/31/2011  . Hyperglycemia 05/31/2011  . Normocytic anemia 12/22/2007  . Chronic kidney disease (CKD), stage III (moderate) ([place]) 12/22/2007  . CHEST PAIN, HX OF 12/22/2007  . Essential hypertension, benign 12/10/2007    Past Surgical History:  Procedure Laterality Date  . AMPUTATION  06/06/2011   Procedure: AMPUTATION DIGIT;  Surgeon: Elam Dutch, MD;  Location: [facility] OR;  Service: Vascular;  Laterality: Left;  Transmetatarsal amputation left great toe  . AMPUTATION  06/12/2011   Procedure: AMPUTATION ABOVE KNEE;  Surgeon: Elam Dutch, MD;  Location: [facility];  Service: Vascular;  Laterality: Left;  . FEMORAL-POPLITEAL BYPASS GRAFT  06/06/2011   Procedure: BYPASS GRAFT FEMORAL-POPLITEAL ARTERY;  Surgeon: Elam Dutch, MD;  [facility];  Service: Vascular;  Laterality: Left;  left femoral to popliteal bypass with composite vein and propaten 43mm x 80 graft  . LOWER EXTREMITY ANGIOGRAM N/A 06/04/2011   Procedure: LOWER EXTREMITY ANGIOGRAM;  Surgeon: Angelia Mould, MD;  Location: [facility] CATH LAB;  Service: Cardiovascular;  Laterality: N/A;  . OMENTECTOMY  12/31/2015   Procedure: Partial OMENTECTOMY;  Surgeon: Georganna Skeans, MD;  Location: [facility]  OR;  Service: General;;  . RIGHT HEART CATH N/A 02/13/2019   Procedure: RIGHT HEART CATH;  Surgeon: Jolaine Artist, MD;  Location: [facility] CV LAB;  Service: Cardiovascular;  Laterality: N/A;  . TUBAL LIGATION    . VENTRAL HERNIA REPAIR N/A 12/31/2015   Procedure: REPAIR INCARCERATED VENTRAL HERNIA,  POSSIBLE BOWEL RESECTION;  Surgeon: Georganna Skeans, MD;  Location: [facility];  Service: General;  Laterality: N/A;     OB History   No obstetric history on file.     Family History  Problem Relation Age of Onset  . Heart attack Father   . Heart disease Father   . Diabetes Mother   . Hypertension Mother   . Heart attack Brother     Social History   Tobacco Use  . Smoking status: Former Smoker    Packs/day: 2.00    Years: 50.00    Pack years: 100.00    Quit date: 06/01/2011    Years since quitting: 8.7  . Smokeless tobacco: Never Used  Vaping Use  . Vaping Use: Never used  Substance Use Topics  . Alcohol use: No  . Drug use: No    Home Medications Prior to Admission medications   Medication Sig Start Date End Date Taking? Authorizing Provider  amLODipine (NORVASC) 10 MG tablet Take 10 mg by mouth daily.  07/08/14   [provider]  aspirin 81 MG EC tablet Take 81 mg by mouth daily.      [provider]  atorvastatin (LIPITOR) 40 MG tablet Take 1 tablet (40 mg total) by mouth daily at 6 PM. 02/18/19 03/20/19  Barb Merino, MD  furosemide (LASIX) 40 MG tablet Take 1 tablet (40 mg total) by mouth daily. 02/19/19   Barb Merino, MD  gabapentin (NEURONTIN) 100 MG capsule Take 100 mg by mouth 2 (two) times daily.  06/23/14   [provider]  metoprolol (LOPRESSOR) 50 MG tablet Take 1 tablet (50 mg total) by mouth 2 (two) times daily. 01/10/16   Domenic Polite, MD  Multiple Vitamin (MULTIVITAMIN WITH MINERALS) TABS tablet Take 1 tablet by mouth daily.    [provider]    Allergies    Morphine and related  Review of Systems   Review of Systems  Unable to perform ROS: Mental status change    Physical Exam Updated Vital Signs BP (!) 166/67 (BP Location: Right Arm)   Pulse 75   Temp 98.9 F (37.2 C) (Oral)   Resp 16   SpO2 99%   Physical Exam Vitals and nursing note reviewed.  Constitutional:      General: She is not in acute  distress.    Appearance: Normal appearance. She is well-developed.  HENT:     Head: Normocephalic and atraumatic.  Eyes:     Extraocular Movements: Extraocular movements intact.     Conjunctiva/sclera: Conjunctivae normal.     Pupils: Pupils are equal, round, and reactive to light.  Cardiovascular:     Rate and Rhythm: Normal rate and regular rhythm.     Heart sounds: No murmur heard.   Pulmonary:     Effort: Pulmonary effort is normal. No respiratory distress.     Breath sounds: Normal breath sounds.  Abdominal:     Palpations: Abdomen is soft. There is no mass.     Tenderness: There is no guarding.  Musculoskeletal:        General: Swelling present. Normal range of motion.     Cervical back: Neck supple.  Comments: Left above-the-knee amputation.  Swelling to the right leg.  Skin:    General: Skin is warm and dry.  Neurological:     General: No focal deficit present.     Mental Status: She is alert.     Sensory: No sensory deficit.     Motor: No weakness.     Comments: Patient somewhat drowsy.  Will move all 4 extremities.  Difficult to say say whether speech is slurred or not but family members think it is.     ED Results / Procedures / Treatments   Labs (all labs ordered are listed, but only abnormal results are displayed) Labs Reviewed  COMPREHENSIVE METABOLIC PANEL  CBC  BRAIN NATRIURETIC PEPTIDE  URINALYSIS, ROUTINE W REFLEX MICROSCOPIC  CBG MONITORING, ED    EKG EKG Interpretation  Date/Time:  Wednesday March 02 2020 08:47:51 EST Ventricular Rate:  76 PR Interval:    QRS Duration: 93 QT Interval:  426 QTC Calculation: 479 R Axis:   94 Text Interpretation: Sinus rhythm Right axis deviation Low voltage, precordial leads Baseline wander Confirmed by Fredia Sorrow [phone]) on 03/02/2020 9:11:35 AM   Radiology No results found.  Procedures Procedures (including critical care time)  Medications Ordered in ED Medications  0.9 %  sodium chloride  infusion (has no administration in time range)    ED Course  I have reviewed the triage vital signs and the nursing notes.  Pertinent labs & imaging results that were available during my care of the patient were reviewed by me and considered in my medical decision making (see chart for details).    MDM Rules/Calculators/A&P                          Patient's had all the Covid vaccines.  Covid testing here negative.  Will do altered mental status work-up.  Chest x-ray raise some concerns about pneumonia so CT chest without was done showed no evidence of pulmonary edema no evidence of pneumonia.  Did raise some concerns about inflammation around the gallbladder.  The patient does not have a leukocytosis liver function test are normal.  She is completely nontender in the right upper quadrant.  Patient more alert following the IV fluids.  There may been a component of dehydration.  Her BUN and creatinine is doubled from where it was in January.  Raising concerns for acute kidney injury.  Patient has MRI and urinalysis still pending.  Patient may require admission for the acute kidney injury or BNP could be repeated to see if the fluids have helped that.  Certainly if patient has evidence of stroke which was not evidence on head CT but on MRI would mean admission.  Urinalysis is still pending.    Final Clinical Impression(s) / ED Diagnoses Final diagnoses:  None    Rx / DC Orders ED Discharge Orders    None       Fredia Sorrow, MD 03/02/20 1416

## 2020-03-02 NOTE — ED Triage Notes (Signed)
Patient from home with [facility] for complaint of altered mental status, slurred speech for the last week. Baseline alert and oriented x4 per EMS, baseline nonambulatory and uses wheelchair.  EMS Vitals 160/86 HR 77 SpO2 96% on room air

## 2020-03-03 ENCOUNTER — Other Ambulatory Visit: Payer: Self-pay

## 2020-03-03 DIAGNOSIS — G9341 Metabolic encephalopathy: Secondary | ICD-10-CM | POA: Diagnosis not present

## 2020-03-03 DIAGNOSIS — N179 Acute kidney failure, unspecified: Secondary | ICD-10-CM

## 2020-03-03 DIAGNOSIS — N185 Chronic kidney disease, stage 5: Secondary | ICD-10-CM

## 2020-03-03 DIAGNOSIS — I5032 Chronic diastolic (congestive) heart failure: Secondary | ICD-10-CM

## 2020-03-03 DIAGNOSIS — R4182 Altered mental status, unspecified: Secondary | ICD-10-CM | POA: Diagnosis not present

## 2020-03-03 LAB — CBC WITH DIFFERENTIAL/PLATELET
Abs Immature Granulocytes: 0.05 10*3/uL (ref 0.00–0.07)
Basophils Absolute: 0.1 10*3/uL (ref 0.0–0.1)
Basophils Relative: 1 %
Eosinophils Absolute: 0.6 10*3/uL — ABNORMAL HIGH (ref 0.0–0.5)
Eosinophils Relative: 7 %
HCT: 29.5 % — ABNORMAL LOW (ref 36.0–46.0)
Hemoglobin: 9 g/dL — ABNORMAL LOW (ref 12.0–15.0)
Immature Granulocytes: 1 %
Lymphocytes Relative: 15 %
Lymphs Abs: 1.2 10*3/uL (ref 0.7–4.0)
MCH: 29.3 pg (ref 26.0–34.0)
MCHC: 30.5 g/dL (ref 30.0–36.0)
MCV: 96.1 fL (ref 80.0–100.0)
Monocytes Absolute: 0.9 10*3/uL (ref 0.1–1.0)
Monocytes Relative: 12 %
Neutro Abs: 4.9 10*3/uL (ref 1.7–7.7)
Neutrophils Relative %: 64 %
Platelets: 246 10*3/uL (ref 150–400)
RBC: 3.07 MIL/uL — ABNORMAL LOW (ref 3.87–5.11)
RDW: 15.9 % — ABNORMAL HIGH (ref 11.5–15.5)
WBC: 7.7 10*3/uL (ref 4.0–10.5)
nRBC: 0 % (ref 0.0–0.2)

## 2020-03-03 LAB — BASIC METABOLIC PANEL
Anion gap: 9 (ref 5–15)
BUN: 42 mg/dL — ABNORMAL HIGH (ref 8–23)
CO2: 20 mmol/L — ABNORMAL LOW (ref 22–32)
Calcium: 8.3 mg/dL — ABNORMAL LOW (ref 8.9–10.3)
Chloride: 114 mmol/L — ABNORMAL HIGH (ref 98–111)
Creatinine, Ser: 5.08 mg/dL — ABNORMAL HIGH (ref 0.44–1.00)
GFR, Estimated: 8 mL/min — ABNORMAL LOW (ref >60)
Glucose, Bld: 86 mg/dL (ref 70–99)
Potassium: 3.9 mmol/L (ref 3.5–5.1)
Sodium: 143 mmol/L (ref 135–145)

## 2020-03-03 LAB — MAGNESIUM: Magnesium: 1.8 mg/dL (ref 1.7–2.4)

## 2020-03-03 MED ORDER — SODIUM CHLORIDE 0.9 % IV SOLN
1.0000 g | INTRAVENOUS | Status: DC
  Administered 2020-03-03 – 2020-03-04 (×2): 1 g via INTRAVENOUS
  Filled 2020-03-03 (×2): qty 10

## 2020-03-03 MED ORDER — HYDRALAZINE HCL 20 MG/ML IJ SOLN: 5.0000 mg | Freq: Four times a day (QID) | INTRAMUSCULAR | Status: DC | PRN

## 2020-03-03 MED ORDER — ONDANSETRON HCL 4 MG/2ML IJ SOLN
4.0000 mg | Freq: Three times a day (TID) | INTRAMUSCULAR | Status: DC | PRN
  Administered 2020-03-03 – 2020-03-04 (×2): 4 mg via INTRAVENOUS
  Filled 2020-03-03 (×2): qty 2

## 2020-03-03 NOTE — ED Notes (Signed)
Pt refusing the RN to give Heparin injection at this time.

## 2020-03-03 NOTE — Hospital Course (Addendum)
SARAHGRACE BROMAN is a 74 y.o. female with PMH CKDV, chronic dCHF, PAD s/p LAKA, HLD, HTN, neuropathy who presented to the ER with reported worsening mentation, agitation, and change in behavior for approximately 1 week.  Family was concerned about possible infection/UTI. Patient was unable to provide any meaningful collateral information in the ER.  Initial CT head was obtained in the ER which was negative for any acute abnormalities but showed brain atrophy and advanced cerebral white matter with chronic small vessel disease. 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  On further work-up, she was also found to have an elevated creatinine above her baseline.  She underwent renal ultrasound on admission as well which revealed small kidneys with increased echogenicity, consistent with chronic medical renal disease.  Due to her change in mentation, she was admitted for further work-up and monitoring.  She was started on Rocephin for possible UTI.  UA was positive for trace leukocytes, rare bacteria, 11-20 WBC. Her mentation continued to improve.  Urine cultures remained negative.  She was discharged with [facility] to complete a course outpatient. She was also evaluated by physical therapy and considered stable for discharging home with ongoing home health.

## 2020-03-03 NOTE — ED Notes (Signed)
Patient vomiting MD messaged

## 2020-03-03 NOTE — Progress Notes (Signed)
PROGRESS NOTE    Stephanie Griffith   AYT:[id number]  DOB: [birth year]  DOA: 03/02/2020     1  PCP: [name], MD  CC: AMS  Hospital Course: Stephanie Griffith is a 74 y.o. female with PMH CKDV, chronic dCHF, PAD s/p LAKA, HLD, HTN, neuropathy who presented to the ER with reported worsening mentation, agitation, and change in behavior for approximately 1 week.  Family was concerned about possible infection/UTI. Patient was unable to provide any meaningful collateral information in the ER.  Initial CT head was obtained in the ER which was negative for any acute abnormalities but showed brain atrophy and advanced cerebral white matter with chronic small vessel disease. 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  On further work-up, she was also found to have an elevated creatinine above her baseline.  She underwent renal ultrasound on admission as well which revealed small kidneys with increased echogenicity, consistent with chronic medical renal disease.  Due to her change in mentation, she was admitted for further work-up and monitoring.  She was started on Rocephin for possible UTI.  UA was positive for trace leukocytes, rare bacteria, 11-20 WBC.   Interval History:  Seen in her room this morning.  Unable to interact or provide any meaningful information.  Was able to tell me her name but that was hit.  Could not follow commands or stay awake.  Old records reviewed in assessment of this patient  ROS: Review of systems not obtained due to patient factors.  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  CT head negative for bleed.  Lower suspicion that this could be considered nonconvulsive epilepsy. No fever or other concern for meningitis/needing LP at this time  Possible UTI - UA with trace LE, rare bacteria, 11-20 WBC - Follow urine culture - continue rocephin  AKI on CKD V - Baseline creatinine appears to be 3.5-4; presented with creatinine of 5.3  - continue IVF and monitor mentation and BMP -Ultrasound consistent with medical renal disease.  No obstruction or hydronephrosis  Chronic diastolic CHF Euvolemic on exam; BNP considered elevated in setting of CKD Currently on gentle IVF Closely monitor volume status  Essential hypertension BP is not controlled Resume home oral antihypertensives IV hydralazine as needed with parameters  Chronic metabolic acidosis in the setting of advanced CKD Resume home oral sodium bicarbonate Serum bicarb on presentation 20, anion gap of 12 -Continue bicarb  Polyneuropathy Hold off on gabapentin for now due to acute encephalopathy  Antimicrobials: Rocephin 03/02/2020>> present  DVT prophylaxis: HSQ Code Status: Full Family Communication: none present Disposition Plan: Status is: Inpatient  Remains inpatient appropriate because:Altered mental status, Unsafe d/c plan, IV treatments appropriate due to intensity of illness or inability to take PO and Inpatient level of care appropriate due to severity of illness   Dispo:  Patient From: Home  Planned Disposition: Home  Expected discharge date: 03/04/20  Medically stable for discharge: No        Objective: Blood pressure 128/62, pulse 62, temperature (!) 97.5 F (36.4 C),  temperature source Oral, resp. rate 16, SpO2 94 %.  Examination: General appearance: Chronically ill-appearing elderly woman  lying in bed in no distress Head: Normocephalic, without obvious abnormality, atraumatic Eyes: EOMI Lungs: clear to auscultation bilaterally Heart: regular rate and rhythm and S1, S2 normal Abdomen: normal findings: bowel sounds normal and soft, non-tender Extremities: No edema Skin: mobility and turgor normal Neurologic: Severely confused and unable to follow any commands; spontaneously moves all 4 extremities  Consultants:     Procedures:     Data Reviewed: I have personally reviewed following labs and imaging studies Results for orders placed or performed during the hospital encounter of 03/02/20 (from the past 24 hour(s))  Urinalysis, Routine w reflex microscopic     Status: Abnormal   Collection Time: 03/02/20  6:03 PM  Result Value Ref Range   Color, Urine YELLOW YELLOW   APPearance HAZY (A) CLEAR   Specific Gravity, Urine 1.011 1.005 - 1.030   pH 8.0 5.0 - 8.0   Glucose, UA NEGATIVE NEGATIVE mg/dL   Hgb urine dipstick SMALL (A) NEGATIVE   Bilirubin Urine NEGATIVE NEGATIVE   Ketones, ur NEGATIVE NEGATIVE mg/dL   Protein, ur 100 (A) NEGATIVE mg/dL   Nitrite NEGATIVE NEGATIVE   Leukocytes,Ua TRACE (A) NEGATIVE   RBC / HPF 0-5 0 - 5 RBC/hpf   WBC, UA 11-20 0 - 5 WBC/hpf   Bacteria, UA RARE (A) NONE SEEN   Squamous Epithelial / LPF 0-5 0 - 5   Mucus PRESENT   Basic metabolic panel     Status: Abnormal   Collection Time: 03/03/20 10:07 AM  Result Value Ref Range   Sodium 143 135 - 145 mmol/L   Potassium 3.9 3.5 - 5.1 mmol/L   Chloride 114 (H) 98 - 111 mmol/L   CO2 20 (L) 22 - 32 mmol/L   Glucose, Bld 86 70 - 99 mg/dL   BUN 42 (H) 8 - 23 mg/dL   Creatinine, Ser 5.08 (H) 0.44 - 1.00 mg/dL   Calcium 8.3 (L) 8.9 - 10.3 mg/dL   GFR, Estimated 8 (L) >60 mL/min   Anion gap 9 5 - 15  CBC with Differential/Platelet     Status: Abnormal   Collection Time: 03/03/20 10:07 AM  Result Value Ref Range   WBC 7.7 4.0 - 10.5 K/uL   RBC 3.07 (L) 3.87 - 5.11 MIL/uL   Hemoglobin  9.0 (L) 12.0 - 15.0 g/dL   HCT 29.5 (L) 36 - 46 %   MCV 96.1 80.0 - 100.0 fL   MCH 29.3 26.0 - 34.0 pg   MCHC 30.5 30.0 - 36.0 g/dL   RDW 15.9 (H) 11.5 - 15.5 %   Platelets 246 150 - 400 K/uL   nRBC 0.0 0.0 - 0.2 %   Neutrophils Relative % 64 %   Neutro Abs 4.9 1.7 - 7.7 K/uL   Lymphocytes Relative 15 %   Lymphs Abs 1.2 0.7 - 4.0 K/uL   Monocytes Relative 12 %   Monocytes Absolute 0.9 0.1 - 1.0 K/uL   Eosinophils Relative 7 %   Eosinophils Absolute 0.6 (H) 0.0 - 0.5 K/uL   Basophils Relative 1 %   Basophils Absolute 0.1 0.0 - 0.1 K/uL   Immature Granulocytes 1 %   Abs Immature Granulocytes 0.05 0.00 - 0.07 K/uL  Magnesium     Status: None   Collection Time: 03/03/20 10:07 AM  Result Value Ref Range   Magnesium 1.8 1.7 - 2.4 mg/dL    Recent Results (  from the past 240 hour(s))  Resp Panel by RT-PCR (Flu A&B, Covid) Nasopharyngeal Swab     Status: None   Collection Time: 03/02/20  9:53 AM   Specimen: Nasopharyngeal Swab; Nasopharyngeal(NP) swabs in vial transport medium  Result Value Ref Range Status   SARS Coronavirus 2 by RT PCR NEGATIVE NEGATIVE Final    Comment: (NOTE) SARS-CoV-2 target nucleic acids are NOT DETECTED.  The SARS-CoV-2 RNA is generally detectable in upper respiratory specimens during the acute phase of infection. The lowest concentration of SARS-CoV-2 viral copies this assay can detect is 138 copies/mL. A negative result does not preclude SARS-Cov-2 infection and should not be used as the sole basis for treatment or other patient management decisions. A negative result may occur with  improper specimen collection/handling, submission of specimen other than nasopharyngeal swab, presence of viral mutation(s) within the areas targeted by this assay, and inadequate number of viral copies(<138 copies/mL). A negative result must be combined with clinical observations, patient history, and epidemiological information. The expected result is Negative.  Fact  Sheet for Patients:  EntrepreneurPulse.com.au  Fact Sheet for Healthcare Providers:  IncredibleEmployment.be  This test is no t yet approved or cleared by the [place] FDA and  has been authorized for detection and/or diagnosis of SARS-CoV-2 by FDA under an Emergency Use Authorization (EUA). This EUA will remain  in effect (meaning this test can be used) for the duration of the COVID-19 declaration under Section 564(b)(1) of the Act, 21 U.S.C.section 360bbb-3(b)(1), unless the authorization is terminated  or revoked sooner.       Influenza A by PCR NEGATIVE NEGATIVE Final   Influenza B by PCR NEGATIVE NEGATIVE Final    Comment: (NOTE) The Xpert Xpress SARS-CoV-2/FLU/RSV plus assay is intended as an aid in the diagnosis of influenza from Nasopharyngeal swab specimens and should not be used as a sole basis for treatment. Nasal washings and aspirates are unacceptable for Xpert Xpress SARS-CoV-2/FLU/RSV testing.  Fact Sheet for Patients: EntrepreneurPulse.com.au  Fact Sheet for Healthcare Providers: IncredibleEmployment.be  This test is not yet approved or cleared by the [place] FDA and has been authorized for detection and/or diagnosis of SARS-CoV-2 by FDA under an Emergency Use Authorization (EUA). This EUA will remain in effect (meaning this test can be used) for the duration of the COVID-19 declaration under Section 564(b)(1) of the Act, 21 U.S.C. section 360bbb-3(b)(1), unless the authorization is terminated or revoked.  Performed at [facility], [place] [address], [place], [place] [address]      Radiology Studies: CT Head Wo Contrast  Result Date: 03/02/2020 CLINICAL DATA:  Mental status change, unknown cause. Additional history provided: Slurred speech for 1 week. EXAM: CT HEAD WITHOUT CONTRAST TECHNIQUE: Contiguous axial images were obtained from the base of the skull through the  vertex without intravenous contrast. COMPARISON:  Brain MRI 04/07/2019. FINDINGS: Brain: Mild cerebral and cerebellar atrophy. Advanced ill-defined hypoattenuation within the cerebral white matter is nonspecific, but compatible with chronic small vessel ischemic disease. Redemonstrated chronic infarct within the inferior right cerebellar hemisphere. Additional tiny chronic infarcts within the bilateral cerebellar hemispheres were better appreciated on the prior MRI of 04/07/2019. There is no acute intracranial hemorrhage. No demarcated cortical infarct. No extra-axial fluid collection. No evidence of intracranial mass. No midline shift. Partially empty sella turcica. Vascular: No hyperdense vessel.  Atherosclerotic calcifications. Skull: Normal. Negative for fracture or focal lesion. Sinuses/Orbits: Visualized orbits show no acute finding. As before, there is near complete opacification of the left  maxillary sinus with associated chronic reactive osteitis. IMPRESSION: No evidence of acute intracranial abnormality. Mild atrophy of the brain and advanced cerebral white matter chronic small vessel ischemic disease. Redemonstrated chronic infarct within the inferior right cerebellar hemisphere. Additional tiny chronic infarcts within the bilateral cerebellar hemispheres were better appreciated on the prior MRI of 04/07/2019. Severe chronic left maxillary sinusitis. Electronically Signed   By: Kellie Simmering DO   On: 03/02/2020 10:32   CT Chest Wo Contrast  Result Date: 03/02/2020 CLINICAL DATA:  Altered mental status and slurred speech. Current chest radiograph demonstrates interstitial opacities suspicious for edema or atypical infection. EXAM: CT CHEST WITHOUT CONTRAST TECHNIQUE: Multidetector CT imaging of the chest was performed following the standard protocol without IV contrast. COMPARISON:  Current and prior chest radiographs. FINDINGS: Cardiovascular: Mild cardiomegaly. Small pericardial effusion. Mild 2  vessel coronary artery calcifications. Aorta is normal in caliber. There are aortic atherosclerotic calcifications. Main pulmonary artery is dilated to 4.1 cm. Mediastinum/Nodes: Prominent neck base lymph nodes, largest on the left, 8 mm in short axis. There are prominent to mildly enlarged mediastinal lymph nodes, largest 2 are right paratracheal, 1.3 and 1.2 cm in short axes. No mediastinal or hilar masses. Esophagus is distended with fluid and nondependent air. No esophageal mass or wall thickening. Trachea is unremarkable. Lungs/Pleura: Mild linear and reticular opacities are noted at the bases consistent with atelectasis and/or scarring. There are few additional areas of peripheral reticular lung opacity consistent with scarring most evident in the anterior upper lobes. No evidence of pneumonia or pulmonary edema. No lung mass or suspicious nodule. Mild centrilobular and paraseptal emphysema. No pleural effusion or pneumothorax. Upper Abdomen: Multiple gallstones. There is hazy opacity surrounding the gallbladder. Wall is somewhat ill-defined. Musculoskeletal: No fracture or acute finding. No osteoblastic or osteolytic lesions. No chest wall masses. IMPRESSION: 1. No evidence of pneumonia or pulmonary edema. 2. Cholelithiasis with hazy opacity surrounding the gallbladder concerning for inflammation. Consider acute cholecystitis if there are consistent clinical findings, which could be further assessed with right upper quadrant ultrasound. 3. Mild cardiomegaly and small pericardial effusion. 4. Enlarged main pulmonary artery suggesting pulmonary artery hypertension. 5. Mild emphysema.  Aortic atherosclerotic calcifications. Aortic Atherosclerosis (ICD10-I70.0) and Emphysema (ICD10-J43.9). Electronically Signed   By: Lajean Manes M.D.   On: 03/02/2020 13:45   MR Brain Wo Contrast (neuro protocol)  Result Date: 03/02/2020 CLINICAL DATA:  Mental status change EXAM: MRI HEAD WITHOUT CONTRAST TECHNIQUE:  Multiplanar, multiecho pulse sequences of the brain and surrounding structures were obtained without intravenous contrast. COMPARISON:  04/07/2019 FINDINGS: Brain: There is no acute infarction or intracranial hemorrhage. Moderate size chronic right cerebellar infarct. Additional tiny bilateral chronic cerebellar infarcts. Patchy and confluent areas of T2 hyperintensity supratentorial white matter are nonspecific but probably reflect stable moderate to advanced chronic microvascular ischemic changes. There is no intracranial mass, mass effect, or edema. There is no hydrocephalus or extra-axial fluid collection. Ventricles and sulci are stable in size and configuration. Vascular: Major vessel flow voids at the skull base are preserved. Skull and upper cervical spine: Normal marrow signal is preserved. Sinuses/Orbits: Chronic left maxillary sinusitis with persistent circumferential mucosal thickening. Minor mucosal thickening elsewhere. Orbits are unremarkable. Other: Incidental note is made of partial empty sella. Mastoid air cells are clear. IMPRESSION: No acute infarction, hemorrhage, or mass. Chronic cerebellar infarcts. Moderate to advanced chronic microvascular ischemic changes. Electronically Signed   By: Macy Mis M.D.   On: 03/02/2020 18:19   US RENAL  Result Date: 03/02/2020  CLINICAL DATA:  Acute renal injury EXAM: RENAL / URINARY TRACT ULTRASOUND COMPLETE COMPARISON:  02/08/2019 FINDINGS: Right Kidney: Renal measurements: 6.6 x 4.3 x 4.4 cm. = volume: 60 mL. Diffuse increased echogenicity is noted. Cortical thinning is seen as well. No mass lesion or hydronephrosis is noted. Left Kidney: Renal measurements: 6.8 x 3.9 x 3.2 cm. = volume: 46 mL. Increased echogenicity is noted. No mass lesion or hydronephrosis is seen. Bladder: Appears normal for degree of bladder distention. Other: None. IMPRESSION: Small kidneys with increased echogenicity consistent with chronic medical renal disease. No  obstructive changes are noted. Electronically Signed   By: Inez Catalina M.D.   On: 03/02/2020 22:08   DG Chest Port 1 View  Result Date: 03/02/2020 CLINICAL DATA:  Altered mental status EXAM: PORTABLE CHEST 1 VIEW COMPARISON:  Chest radiograph February 05, 2019 FINDINGS: Cardiomegaly similar priors. Left greater than right interstitial opacities. No visible pneumothorax or pleural effusion. No acute osseous abnormality. IMPRESSION: Left greater than right interstitial opacities, which may reflect edema or atypical infection. Electronically Signed   By: Dahlia Bailiff MD   On: 03/02/2020 10:02   US RENAL  Final Result    MR Brain Wo Contrast (neuro protocol)  Final Result    CT Chest Wo Contrast  Final Result    CT Head Wo Contrast  Final Result    DG Chest Port 1 View  Final Result      Scheduled Meds: . amLODipine  10 mg Oral Daily  . aspirin EC  81 mg Oral Daily  . atorvastatin  40 mg Oral q1800  . heparin injection (subcutaneous)  5,000 Units Subcutaneous Q8H  . metoprolol tartrate  50 mg Oral BID  . multivitamin with minerals  1 tablet Oral Daily  . sodium bicarbonate  650 mg Oral TID   PRN Meds: hydrALAZINE, ondansetron (ZOFRAN) IV Continuous Infusions: . sodium chloride 50 mL/hr at 03/03/20 1237  . cefTRIAXone (ROCEPHIN)  IV       LOS: 1 day  Time spent: Greater than 50% of the 35 minute visit was spent in counseling/coordination of care for the patient as laid out in the A&P.   Dwyane Dee, MD [facility] 03/03/2020, 4:26 PM

## 2020-03-04 DIAGNOSIS — N185 Chronic kidney disease, stage 5: Secondary | ICD-10-CM | POA: Diagnosis not present

## 2020-03-04 DIAGNOSIS — N179 Acute kidney failure, unspecified: Secondary | ICD-10-CM

## 2020-03-04 DIAGNOSIS — G9341 Metabolic encephalopathy: Secondary | ICD-10-CM | POA: Diagnosis not present

## 2020-03-04 LAB — CBC WITH DIFFERENTIAL/PLATELET
Abs Immature Granulocytes: 0.06 10*3/uL (ref 0.00–0.07)
Basophils Absolute: 0.1 10*3/uL (ref 0.0–0.1)
Basophils Relative: 1 %
Eosinophils Absolute: 0.6 10*3/uL — ABNORMAL HIGH (ref 0.0–0.5)
Eosinophils Relative: 9 %
HCT: 28.1 % — ABNORMAL LOW (ref 36.0–46.0)
Hemoglobin: 8.3 g/dL — ABNORMAL LOW (ref 12.0–15.0)
Immature Granulocytes: 1 %
Lymphocytes Relative: 19 %
Lymphs Abs: 1.3 10*3/uL (ref 0.7–4.0)
MCH: 28.6 pg (ref 26.0–34.0)
MCHC: 29.5 g/dL — ABNORMAL LOW (ref 30.0–36.0)
MCV: 96.9 fL (ref 80.0–100.0)
Monocytes Absolute: 1 10*3/uL (ref 0.1–1.0)
Monocytes Relative: 14 %
Neutro Abs: 3.9 10*3/uL (ref 1.7–7.7)
Neutrophils Relative %: 56 %
Platelets: 219 10*3/uL (ref 150–400)
RBC: 2.9 MIL/uL — ABNORMAL LOW (ref 3.87–5.11)
RDW: 15.8 % — ABNORMAL HIGH (ref 11.5–15.5)
WBC: 7 10*3/uL (ref 4.0–10.5)
nRBC: 0 % (ref 0.0–0.2)

## 2020-03-04 LAB — BASIC METABOLIC PANEL
Anion gap: 12 (ref 5–15)
BUN: 40 mg/dL — ABNORMAL HIGH (ref 8–23)
CO2: 18 mmol/L — ABNORMAL LOW (ref 22–32)
Calcium: 8.4 mg/dL — ABNORMAL LOW (ref 8.9–10.3)
Chloride: 112 mmol/L — ABNORMAL HIGH (ref 98–111)
Creatinine, Ser: 4.97 mg/dL — ABNORMAL HIGH (ref 0.44–1.00)
GFR, Estimated: 9 mL/min — ABNORMAL LOW (ref >60)
Glucose, Bld: 82 mg/dL (ref 70–99)
Potassium: 4.1 mmol/L (ref 3.5–5.1)
Sodium: 142 mmol/L (ref 135–145)

## 2020-03-04 LAB — MAGNESIUM: Magnesium: 1.8 mg/dL (ref 1.7–2.4)

## 2020-03-04 LAB — PHOSPHORUS: Phosphorus: 4.1 mg/dL (ref 2.5–4.6)

## 2020-03-04 NOTE — Progress Notes (Signed)
PROGRESS NOTE    Stephanie Griffith   GYI:[id number]  DOB: 07-19-1945  DOA: 03/02/2020     2  PCP: [name], MD  CC: AMS  Hospital Course: Stephanie Griffith is a 74 y.o. female with PMH CKDV, chronic dCHF, PAD s/p LAKA, HLD, HTN, neuropathy who presented to the ER with reported worsening mentation, agitation, and change in behavior for approximately 1 week.  Family was concerned about possible infection/UTI. Patient was unable to provide any meaningful collateral information in the ER.  Initial CT head was obtained in the ER which was negative for any acute abnormalities but showed brain atrophy and advanced cerebral white matter with chronic small vessel disease. 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  On further work-up, she was also found to have an elevated creatinine above her baseline.  She underwent renal ultrasound on admission as well which revealed small kidneys with increased echogenicity, consistent with chronic medical renal disease.  Due to her change in mentation, she was admitted for further work-up and monitoring.  She was started on Rocephin for possible UTI.  UA was positive for trace leukocytes, rare bacteria, 11-20 WBC.   Interval History:  No events overnight.  This morning she is actually much more awake and alert.  She is able to answer questions and is oriented.  She is wheelchair-bound at baseline and lives with one of her daughters.  She was asking if she will be able to go home today.  Old records reviewed in assessment of this patient  ROS: Constitutional: negative for chills and fevers, Respiratory: negative for asthma and cough, Cardiovascular: negative for chest pain and Gastrointestinal: negative for abdominal pain    Assessment & Plan: Acute metabolic encephalopathy Mild vascular dementia -Overall has improved since  admission.  No culture results but has improved with measures in place.  Therefore, will continue on fluids and antibiotics.  Will finish out empiric course -Follow-up PT evaluation -Suspect she is approaching stability for discharging back home  Possible UTI - UA with trace LE, rare bacteria, 11-20 WBC - Follow urine culture; did not reflex - continue rocephin; finish out empiric course  AKI on CKD V - Baseline creatinine appears to be 3.5-4; presented with creatinine of 5.3  -Improving with fluids - continue IVF and monitor mentation and BMP -Ultrasound consistent with medical renal disease.  No obstruction or hydronephrosis  Chronic diastolic CHF Euvolemic on exam; BNP considered elevated in setting of CKD Currently on gentle IVF Closely monitor volume status  Essential hypertension BP is not controlled Resume home oral antihypertensives IV hydralazine as needed with parameters  Chronic metabolic acidosis in the setting of advanced CKD Resume home oral sodium bicarbonate Serum bicarb on presentation 20, anion gap of 12 -Continue bicarb  Polyneuropathy Hold off on gabapentin for now due to acute encephalopathy  Antimicrobials: Rocephin 03/02/2020>> present  DVT prophylaxis: HSQ Code Status: Full Family Communication: Attempted to call both daughters twice today, no answer Disposition Plan: Status is: Inpatient  Remains inpatient appropriate because:Unsafe d/c plan, IV treatments appropriate due to intensity of illness or inability to take PO and Inpatient level of care appropriate due to severity of illness   Dispo:  Patient From: Home  Planned Disposition: Wheelchair-bound at baseline, likely home  Expected discharge date: 03/05/20  Medically stable for discharge: No  Objective: Blood pressure (!) 153/73, pulse 68, temperature (!) 97.5 F (  36.4 C), temperature source Oral, resp. rate 20, SpO2 99 %.  Examination: General appearance: Chronically  ill-appearing elderly woman lying in bed in no distress Head: Normocephalic, without obvious abnormality, atraumatic Eyes: EOMI Lungs: clear to auscultation bilaterally Heart: regular rate and rhythm and S1, S2 normal Abdomen: normal findings: bowel sounds normal and soft, non-tender Extremities: No edema Skin: mobility and turgor normal Neurologic: Much more awake and alert.  Follows commands and moves all 4 extremities  Consultants:     Procedures:     Data Reviewed: I have personally reviewed following labs and imaging studies Results for orders placed or performed during the hospital encounter of 03/02/20 (from the past 24 hour(s))  Basic metabolic panel     Status: Abnormal   Collection Time: 03/04/20  3:04 AM  Result Value Ref Range   Sodium 142 135 - 145 mmol/L   Potassium 4.1 3.5 - 5.1 mmol/L   Chloride 112 (H) 98 - 111 mmol/L   CO2 18 (L) 22 - 32 mmol/L   Glucose, Bld 82 70 - 99 mg/dL   BUN 40 (H) 8 - 23 mg/dL   Creatinine, Ser 4.97 (H) 0.44 - 1.00 mg/dL   Calcium 8.4 (L) 8.9 - 10.3 mg/dL   GFR, Estimated 9 (L) >60 mL/min   Anion gap 12 5 - 15  CBC with Differential/Platelet     Status: Abnormal   Collection Time: 03/04/20  3:04 AM  Result Value Ref Range   WBC 7.0 4.0 - 10.5 K/uL   RBC 2.90 (L) 3.87 - 5.11 MIL/uL   Hemoglobin 8.3 (L) 12.0 - 15.0 g/dL   HCT 28.1 (L) 36 - 46 %   MCV 96.9 80.0 - 100.0 fL   MCH 28.6 26.0 - 34.0 pg   MCHC 29.5 (L) 30.0 - 36.0 g/dL   RDW 15.8 (H) 11.5 - 15.5 %   Platelets 219 150 - 400 K/uL   nRBC 0.0 0.0 - 0.2 %   Neutrophils Relative % 56 %   Neutro Abs 3.9 1.7 - 7.7 K/uL   Lymphocytes Relative 19 %   Lymphs Abs 1.3 0.7 - 4.0 K/uL   Monocytes Relative 14 %   Monocytes Absolute 1.0 0.1 - 1.0 K/uL   Eosinophils Relative 9 %   Eosinophils Absolute 0.6 (H) 0.0 - 0.5 K/uL   Basophils Relative 1 %   Basophils Absolute 0.1 0.0 - 0.1 K/uL   Immature Granulocytes 1 %   Abs Immature Granulocytes 0.06 0.00 - 0.07 K/uL  Magnesium      Status: None   Collection Time: 03/04/20  3:04 AM  Result Value Ref Range   Magnesium 1.8 1.7 - 2.4 mg/dL  Phosphorus     Status: None   Collection Time: 03/04/20  3:04 AM  Result Value Ref Range   Phosphorus 4.1 2.5 - 4.6 mg/dL    Recent Results (from the past 240 hour(s))  Resp Panel by RT-PCR (Flu A&B, Covid) Nasopharyngeal Swab     Status: None   Collection Time: 03/02/20  9:53 AM   Specimen: Nasopharyngeal Swab; Nasopharyngeal(NP) swabs in vial transport medium  Result Value Ref Range Status   SARS Coronavirus 2 by RT PCR NEGATIVE NEGATIVE Final    Comment: (NOTE) SARS-CoV-2 target nucleic acids are NOT DETECTED.  The SARS-CoV-2 RNA is generally detectable in upper respiratory specimens during the acute phase of infection. The lowest concentration of SARS-CoV-2 viral copies this assay can detect is 138 copies/mL. A negative result does not preclude SARS-Cov-2  infection and should not be used as the sole basis for treatment or other patient management decisions. A negative result may occur with  improper specimen collection/handling, submission of specimen other than nasopharyngeal swab, presence of viral mutation(s) within the areas targeted by this assay, and inadequate number of viral copies(<138 copies/mL). A negative result must be combined with clinical observations, patient history, and epidemiological information. The expected result is Negative.  Fact Sheet for Patients:  EntrepreneurPulse.com.au  Fact Sheet for Healthcare Providers:  IncredibleEmployment.be  This test is no t yet approved or cleared by the [place] FDA and  has been authorized for detection and/or diagnosis of SARS-CoV-2 by FDA under an Emergency Use Authorization (EUA). This EUA will remain  in effect (meaning this test can be used) for the duration of the COVID-19 declaration under Section 564(b)(1) of the Act, 21 U.S.C.section 360bbb-3(b)(1), unless  the authorization is terminated  or revoked sooner.       Influenza A by PCR NEGATIVE NEGATIVE Final   Influenza B by PCR NEGATIVE NEGATIVE Final    Comment: (NOTE) The Xpert Xpress SARS-CoV-2/FLU/RSV plus assay is intended as an aid in the diagnosis of influenza from Nasopharyngeal swab specimens and should not be used as a sole basis for treatment. Nasal washings and aspirates are unacceptable for Xpert Xpress SARS-CoV-2/FLU/RSV testing.  Fact Sheet for Patients: EntrepreneurPulse.com.au  Fact Sheet for Healthcare Providers: IncredibleEmployment.be  This test is not yet approved or cleared by the [place] FDA and has been authorized for detection and/or diagnosis of SARS-CoV-2 by FDA under an Emergency Use Authorization (EUA). This EUA will remain in effect (meaning this test can be used) for the duration of the COVID-19 declaration under Section 564(b)(1) of the Act, 21 U.S.C. section 360bbb-3(b)(1), unless the authorization is terminated or revoked.  Performed at [facility], [place] [address], [place], [place] [address]      Radiology Studies: MR Brain Wo Contrast (neuro protocol)  Result Date: 03/02/2020 CLINICAL DATA:  Mental status change EXAM: MRI HEAD WITHOUT CONTRAST TECHNIQUE: Multiplanar, multiecho pulse sequences of the brain and surrounding structures were obtained without intravenous contrast. COMPARISON:  04/07/2019 FINDINGS: Brain: There is no acute infarction or intracranial hemorrhage. Moderate size chronic right cerebellar infarct. Additional tiny bilateral chronic cerebellar infarcts. Patchy and confluent areas of T2 hyperintensity supratentorial white matter are nonspecific but probably reflect stable moderate to advanced chronic microvascular ischemic changes. There is no intracranial mass, mass effect, or edema. There is no hydrocephalus or extra-axial fluid collection. Ventricles and sulci are stable in size and  configuration. Vascular: Major vessel flow voids at the skull base are preserved. Skull and upper cervical spine: Normal marrow signal is preserved. Sinuses/Orbits: Chronic left maxillary sinusitis with persistent circumferential mucosal thickening. Minor mucosal thickening elsewhere. Orbits are unremarkable. Other: Incidental note is made of partial empty sella. Mastoid air cells are clear. IMPRESSION: No acute infarction, hemorrhage, or mass. Chronic cerebellar infarcts. Moderate to advanced chronic microvascular ischemic changes. Electronically Signed   By: Macy Mis M.D.   On: 03/02/2020 18:19   US RENAL  Result Date: 03/02/2020 CLINICAL DATA:  Acute renal injury EXAM: RENAL / URINARY TRACT ULTRASOUND COMPLETE COMPARISON:  02/08/2019 FINDINGS: Right Kidney: Renal measurements: 6.6 x 4.3 x 4.4 cm. = volume: 60 mL. Diffuse increased echogenicity is noted. Cortical thinning is seen as well. No mass lesion or hydronephrosis is noted. Left Kidney: Renal measurements: 6.8 x 3.9 x 3.2 cm. = volume: 46 mL. Increased echogenicity is noted.  No mass lesion or hydronephrosis is seen. Bladder: Appears normal for degree of bladder distention. Other: None. IMPRESSION: Small kidneys with increased echogenicity consistent with chronic medical renal disease. No obstructive changes are noted. Electronically Signed   By: Inez Catalina M.D.   On: 03/02/2020 22:08   US RENAL  Final Result    MR Brain Wo Contrast (neuro protocol)  Final Result    CT Chest Wo Contrast  Final Result    CT Head Wo Contrast  Final Result    DG Chest Port 1 View  Final Result      Scheduled Meds: . amLODipine  10 mg Oral Daily  . aspirin EC  81 mg Oral Daily  . atorvastatin  40 mg Oral q1800  . heparin injection (subcutaneous)  5,000 Units Subcutaneous Q8H  . metoprolol tartrate  50 mg Oral BID  . multivitamin with minerals  1 tablet Oral Daily  . sodium bicarbonate  650 mg Oral TID   PRN Meds: hydrALAZINE, ondansetron  (ZOFRAN) IV Continuous Infusions: . sodium chloride 50 mL/hr at 03/04/20 0904  . cefTRIAXone (ROCEPHIN)  IV 1 g (03/03/20 2133)     LOS: 2 days  Time spent: Greater than 50% of the 35 minute visit was spent in counseling/coordination of care for the patient as laid out in the A&P.   Dwyane Dee, MD [facility] 03/04/2020, 2:06 PM

## 2020-03-04 NOTE — Evaluation (Signed)
Physical Therapy Evaluation Patient Details Name: Stephanie Griffith MRN: [id number] DOB: [birth year] Today's Date: 03/04/2020   History of Present Illness  Patient is a 74 y/o female who presents with AMS, slurred speech, agitation and aggressive behavior. Admitted with acute metabolic encephalopathy multifactorial likely due to UTI vs uremia vs underlying severe chronic microvascular ischemic disease with probable vascular dementia. Brain MRI- unremarkable. PMH includes PAD, PAF, PAT, HTN, HLD, COPD, CHF, pulmonary HTN.  Clinical Impression  Patient presents with confusion, generalized weakness, and impaired mobility s/p above. Pt is not the best historian but reports living at home with her daughter and needs assist with ADLs at baseline. Pt is nonambulatory and uses w/c for mobility. Reports she can do transfers without assist and does not wear her prosthesis (for the last year). Today, pt requires Min A for bed mobility with max cues for sequencing and technique. Oriented to self only with poor attention and poor recall within session. Not sure of pt's cognitive baseline. Pt declined transferring to chair today despite encouragement. Will need to see how much support/assist daughter can provide at home as well as how pt can transfer next session. If daughter not able to provide necessary support, pt may need SNF. Will follow acutely to maximize independence and mobility prior to return home.     Follow Up Recommendations Home health PT;Supervision for mobility/OOB (pending progress/daughter's ability to provide care)    Equipment Recommendations  None recommended by PT    Recommendations for Other Services       Precautions / Restrictions Precautions Precautions: Fall Precaution Comments: left AKA Restrictions Weight Bearing Restrictions: No      Mobility  Bed Mobility Overal bed mobility: Needs Assistance Bed Mobility: Supine to Sit;Sit to Sidelying     Supine to sit: Min assist;HOB  elevated   Sit to sidelying: Supervision General bed mobility comments: Increased time and constant cueing to get to EOB, assist with scooting bottom using chuck pad. Able to scoot self up in bed in sidelying position using rail for support.    Transfers                 General transfer comment: Pt refused to transfer. Able to scoot self along side bed holding onto rail.  Ambulation/Gait                Stairs            Wheelchair Mobility    Modified Rankin (Stroke Patients Only)       Balance Overall balance assessment: Needs assistance Sitting-balance support: Feet unsupported;No upper extremity supported (foot unsupported) Sitting balance-Leahy Scale: Fair Sitting balance - Comments: supervision for safety. posterior lean during AROM of BLEs.                                     Pertinent Vitals/Pain Pain Assessment: No/denies pain    Home Living Family/patient expects to be discharged to:: Private residence Living Arrangements:  (with daughter) Available Help at Discharge: Available PRN/intermittently Type of Home: Apartment Home Access: Stairs to enter Entrance Stairs-Rails: Right;Left;Can reach both Entrance Stairs-Number of Steps: 4 Home Layout: One level Home Equipment: Wheelchair - manual;Bedside commode Additional Comments: Pt not the most reliable historian at this time; above info is reported by pt this admission. Per admission 1 year ago, pt lives in garage apt at daughter's house?    Prior Function Level of Independence:  Needs assistance   Gait / Transfers Assistance Needed: Reports being nonambulatory but can transfer independently, does not wear prosthesis at baseline for ~1 year now per pt report. USes w/c for mobility.  ADL's / Homemaking Assistance Needed: Reports needing some assist with ADLs, does not do IADLs.  Comments: Daughter assists with ADLs, daughter does IADLs. Uses w/c for mobility.  Doesnt wear her  prosthesis anymore, ~1 year.     Hand Dominance   Dominant Hand: Left    Extremity/Trunk Assessment   Upper Extremity Assessment Upper Extremity Assessment: Defer to OT evaluation    Lower Extremity Assessment Lower Extremity Assessment: LLE deficits/detail;Difficult to assess due to impaired cognition;RLE deficits/detail RLE Deficits / Details: Limited ankle AROM, lacking full knee extension and hip flexion. LLE Deficits / Details: left AKA       Communication   Communication: No difficulties  Cognition Arousal/Alertness: Awake/alert Behavior During Therapy: WFL for tasks assessed/performed Overall Cognitive Status: Impaired/Different from baseline Area of Impairment: Orientation;Attention;Memory;Following commands;Awareness;Problem solving                 Orientation Level: Disoriented to;Place;Time;Situation Current Attention Level: Sustained Memory: Decreased short-term memory Following Commands: Follows one step commands with increased time   Awareness: Intellectual Problem Solving: Slow processing;Difficulty sequencing;Requires verbal cues General Comments: "I am going to be stuck at home for New Years" talking about the hospital. "I only get into trouble...when I sit up. " Confused today, poor recall at end of session. Uses humor to mask deficits, States it is 2001 and january or February despite contextual cues.      General Comments General comments (skin integrity, edema, etc.): Skin changes in RLE    Exercises     Assessment/Plan    PT Assessment Patient needs continued PT services  PT Problem List Decreased strength;Decreased mobility;Decreased range of motion;Decreased safety awareness;Decreased balance;Decreased activity tolerance;Decreased cognition       PT Treatment Interventions Therapeutic activities;Cognitive remediation;Therapeutic exercise;Patient/family education;Wheelchair mobility training;Balance training;Functional mobility training     PT Goals (Current goals can be found in the Care Plan section)  Acute Rehab PT Goals Patient Stated Goal: to go home PT Goal Formulation: With patient Time For Goal Achievement: 03/18/20 Potential to Achieve Goals: Fair    Frequency Min 3X/week   Barriers to discharge Decreased caregiver support pt reports daughter works    Co-evaluation               AM-PAC PT "6 Clicks" Mobility  Outcome Measure Help needed turning from your back to your side while in a flat bed without using bedrails?: None Help needed moving from lying on your back to sitting on the side of a flat bed without using bedrails?: A Little Help needed moving to and from a bed to a chair (including a wheelchair)?: A Lot Help needed standing up from a chair using your arms (e.g., wheelchair or bedside chair)?: Total Help needed to walk in hospital room?: Total Help needed climbing 3-5 steps with a railing? : Total 6 Click Score: 12    End of Session   Activity Tolerance: Patient limited by fatigue;Other (comment) (confusion) Patient left: in bed;with call bell/phone within reach;with bed alarm set Nurse Communication: Mobility status PT Visit Diagnosis: Muscle weakness (generalized) (M62.81)    Time: 1345-1405 PT Time Calculation (min) (ACUTE ONLY): 20 min   Charges:   PT Evaluation $PT Eval Moderate Complexity: 1 Mod          Rebeckah Masih H, PT, DPT Acute Rehabilitation  Services Pager [phone] Office [phone]      Lacie Draft 03/04/2020, 2:39 PM

## 2020-03-05 DIAGNOSIS — G9341 Metabolic encephalopathy: Secondary | ICD-10-CM | POA: Diagnosis not present

## 2020-03-05 LAB — CBC WITH DIFFERENTIAL/PLATELET
Abs Immature Granulocytes: 0.06 10*3/uL (ref 0.00–0.07)
Basophils Absolute: 0.1 10*3/uL (ref 0.0–0.1)
Basophils Relative: 1 %
Eosinophils Absolute: 0.5 10*3/uL (ref 0.0–0.5)
Eosinophils Relative: 7 %
HCT: 29 % — ABNORMAL LOW (ref 36.0–46.0)
Hemoglobin: 8.6 g/dL — ABNORMAL LOW (ref 12.0–15.0)
Immature Granulocytes: 1 %
Lymphocytes Relative: 17 %
Lymphs Abs: 1.1 10*3/uL (ref 0.7–4.0)
MCH: 29 pg (ref 26.0–34.0)
MCHC: 29.7 g/dL — ABNORMAL LOW (ref 30.0–36.0)
MCV: 97.6 fL (ref 80.0–100.0)
Monocytes Absolute: 0.9 10*3/uL (ref 0.1–1.0)
Monocytes Relative: 14 %
Neutro Abs: 4 10*3/uL (ref 1.7–7.7)
Neutrophils Relative %: 60 %
Platelets: 220 10*3/uL (ref 150–400)
RBC: 2.97 MIL/uL — ABNORMAL LOW (ref 3.87–5.11)
RDW: 15.7 % — ABNORMAL HIGH (ref 11.5–15.5)
WBC: 6.7 10*3/uL (ref 4.0–10.5)
nRBC: 0 % (ref 0.0–0.2)

## 2020-03-05 LAB — MAGNESIUM: Magnesium: 1.9 mg/dL (ref 1.7–2.4)

## 2020-03-05 LAB — BASIC METABOLIC PANEL
Anion gap: 12 (ref 5–15)
BUN: 39 mg/dL — ABNORMAL HIGH (ref 8–23)
CO2: 17 mmol/L — ABNORMAL LOW (ref 22–32)
Calcium: 8.5 mg/dL — ABNORMAL LOW (ref 8.9–10.3)
Chloride: 114 mmol/L — ABNORMAL HIGH (ref 98–111)
Creatinine, Ser: 4.42 mg/dL — ABNORMAL HIGH (ref 0.44–1.00)
GFR, Estimated: 10 mL/min — ABNORMAL LOW (ref >60)
Glucose, Bld: 82 mg/dL (ref 70–99)
Potassium: 4.7 mmol/L (ref 3.5–5.1)
Sodium: 143 mmol/L (ref 135–145)

## 2020-03-05 LAB — PHOSPHORUS: Phosphorus: 3.8 mg/dL (ref 2.5–4.6)

## 2020-03-05 MED ORDER — CEFDINIR 300 MG PO CAPS: 300.0000 mg | ORAL_CAPSULE | Freq: Every day | ORAL | 0 refills | Status: AC

## 2020-03-05 MED ORDER — CEFDINIR 300 MG PO CAPS: 300.0000 mg | ORAL_CAPSULE | Freq: Two times a day (BID) | ORAL | 0 refills | Status: DC

## 2020-03-05 MED ORDER — CEFDINIR 300 MG PO CAPS
300.0000 mg | ORAL_CAPSULE | ORAL | Status: DC
  Administered 2020-03-05: 300 mg via ORAL
  Filled 2020-03-05: qty 1

## 2020-03-05 NOTE — Progress Notes (Signed)
PHARMACY NOTE:  ANTIMICROBIAL RENAL DOSAGE ADJUSTMENT  Current antimicrobial regimen includes a mismatch between antimicrobial dosage and estimated renal function.  As per policy approved by the Pharmacy & Therapeutics and Medical Executive Committees, the antimicrobial dosage will be adjusted accordingly.  Current antimicrobial dosage:  Cefdinir 300mg  q12h x 2  Indication: UTI  Renal Function: Scr >4, Stage V CKD  CrCl cannot be calculated (Unknown ideal weight.). []      On intermittent HD, scheduled: []      On CRRT    Antimicrobial dosage has been changed to:  Cefdinir 300mg  PO q24h x 2  Additional comments:   Chinenye Katzenberger A. Levada Dy, PharmD, BCPS, FNKF Clinical Pharmacist [place] Please utilize Amion for appropriate phone number to reach the unit pharmacist ([place])   03/05/2020 10:03 AM

## 2020-03-05 NOTE — Discharge Summary (Signed)
Physician Discharge Summary   Stephanie Griffith IRC:[id number] DOB: 03-01-1946 DOA: 03/02/2020  PCP: [name], MD  Admit date: 03/02/2020 Discharge date: 03/05/2020  Admitted From: Home Disposition: Home with home health Discharging physician: Dwyane Dee, MD  Recommendations for Outpatient Follow-up:  1. Continue with home health   Patient discharged to home in Discharge Condition: stable CODE STATUS: Full Diet recommendation:  Diet Orders (From admission, onward)    Start     Ordered   03/05/20 0000  Diet - low sodium heart healthy        03/05/20 1002   03/02/20 1243  Diet Heart Room service appropriate? Yes; Fluid consistency: Thin  Diet effective now       Question Answer Comment  Room service appropriate? Yes   Fluid consistency: Thin      03/02/20 1242          Hospital Course: Stephanie Griffith is a 74 y.o. female with PMH CKDV, chronic dCHF, PAD s/p LAKA, HLD, HTN, neuropathy who presented to the ER with reported worsening mentation, agitation, and change in behavior for approximately 1 week.  Family was concerned about possible infection/UTI. Patient was unable to provide any meaningful collateral information in the ER.  Initial CT head was obtained in the ER which was negative for any acute abnormalities but showed brain atrophy and advanced cerebral white matter with chronic small vessel disease. 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  On further work-up, she was also found to have an elevated creatinine above her baseline.  She underwent renal ultrasound on admission as well which revealed small kidneys with increased echogenicity, consistent with chronic medical renal disease.  Due to her change in mentation, she was admitted for further work-up and monitoring.  She was started on Rocephin for possible UTI.  UA was positive for  trace leukocytes, rare bacteria, 11-20 WBC. Her mentation continued to improve.  Urine cultures remained negative.  She was discharged with Omnicef to complete a course outpatient. She was also evaluated by physical therapy and considered stable for discharging home with ongoing home health.  Acutemetabolicencephalopathy Mild vascular dementia -Overall has improved since admission.  No culture results but has improved with measures in place.  Therefore, will continue on fluids and antibiotics.  Will finish out empiric course.  Discharged with Omnicef to complete course -Follow-up PT evaluation = home health PT  Possible UTI - UA with trace LE, rare bacteria, 11-20 WBC - Follow urine culture; did not reflex - continue rocephin; finish out empiric course with Omnicef at discharge  AKI on CKDV - Baseline creatinine appears to be 3.5-4; presented with creatinine of 5.3  -Improving with fluids - continue IVF and monitor mentation and BMP -Ultrasound consistent with medical renal disease.  No obstruction or hydronephrosis  Chronic diastolic CHF Euvolemic on exam; BNP considered elevated in setting of CKD Currently on gentle IVF Closely monitor volume status  Essential hypertension BP is notcontrolled Resume home oral antihypertensives IV hydralazine as needed with parameters  Chronic metabolic acidosis in the setting of advanced CKD Resume home oral sodium bicarbonate Serum bicarb on presentation 20, anion gap of12 -Continue bicarb  Polyneuropathy Hold off on gabapentin for now due to acute encephalopathy   The patient's chronic medical conditions were treated accordingly per the patient's home medication regimen except as noted.  On day of discharge, patient was felt deemed stable for  discharge. Patient/family member advised to call PCP or come back to ER if needed.   Principal Diagnosis: Acute metabolic encephalopathy  Discharge Diagnoses: Active Hospital Problems    Diagnosis Date Noted  . Acute metabolic encephalopathy 40/34/7425  . Acute renal failure superimposed on stage 5 chronic kidney disease, not on chronic dialysis ([place]) 03/03/2020  . Chronic diastolic CHF (congestive heart failure) ([place]) 03/03/2020  . Metabolic acidosis 95/63/8756    Resolved Hospital Problems  No resolved problems to display.    Discharge Instructions    Diet - low sodium heart healthy   Complete by: As directed    Increase activity slowly   Complete by: As directed    No wound care   Complete by: As directed      Allergies as of 03/05/2020      Reactions   Morphine And Related Other (See Comments)   AMS      Medication List    TAKE these medications   amLODipine 10 MG tablet Commonly known as: NORVASC Take 10 mg by mouth daily.   aspirin 81 MG EC tablet Take 81 mg by mouth daily.   atorvastatin 40 MG tablet Commonly known as: LIPITOR Take 1 tablet (40 mg total) by mouth daily at 6 PM.   cefdinir 300 MG capsule Commonly known as: OMNICEF Take 1 capsule (300 mg total) by mouth daily for 2 days.   furosemide 40 MG tablet Commonly known as: LASIX Take 1 tablet (40 mg total) by mouth daily. What changed: when to take this   gabapentin 100 MG capsule Commonly known as: NEURONTIN Take 100 mg by mouth 2 (two) times daily.   metoprolol tartrate 50 MG tablet Commonly known as: LOPRESSOR Take 1 tablet (50 mg total) by mouth 2 (two) times daily.   multivitamin with minerals Tabs tablet Take 1 tablet by mouth daily.   sodium bicarbonate 650 MG tablet Take 650 mg by mouth in the morning, at noon, and at bedtime.       Allergies  Allergen Reactions  . Morphine And Related Other (See Comments)    AMS    Consultations:   Discharge Exam: BP (!) 156/76 (BP Location: Left Arm)   Pulse 76   Temp 97.7 F (36.5 C) (Oral)   Resp 18   Wt 89.1 kg   SpO2 97%   BMI 29.87 kg/m  General appearance: Chronically ill-appearing elderly woman lying  in bed in no distress Head: Normocephalic, without obvious abnormality, atraumatic Eyes: EOMI Lungs: clear to auscultation bilaterally Heart: regular rate and rhythm and S1, S2 normal Abdomen: normal findings: bowel sounds normal and soft, non-tender Extremities: No edema Skin: mobility and turgor normal Neurologic: Much more awake and alert.  Follows commands and moves all 4 extremities  The results of significant diagnostics from this hospitalization (including imaging, microbiology, ancillary and laboratory) are listed below for reference.   Microbiology: Recent Results (from the past 240 hour(s))  Resp Panel by RT-PCR (Flu A&B, Covid) Nasopharyngeal Swab     Status: None   Collection Time: 03/02/20  9:53 AM   Specimen: Nasopharyngeal Swab; Nasopharyngeal(NP) swabs in vial transport medium  Result Value Ref Range Status   SARS Coronavirus 2 by RT PCR NEGATIVE NEGATIVE Final    Comment: (NOTE) SARS-CoV-2 target nucleic acids are NOT DETECTED.  The SARS-CoV-2 RNA is generally detectable in upper respiratory specimens during the acute phase of infection. The lowest concentration of SARS-CoV-2 viral copies this assay can detect is 138 copies/mL. A  negative result does not preclude SARS-Cov-2 infection and should not be used as the sole basis for treatment or other patient management decisions. A negative result may occur with  improper specimen collection/handling, submission of specimen other than nasopharyngeal swab, presence of viral mutation(s) within the areas targeted by this assay, and inadequate number of viral copies(<138 copies/mL). A negative result must be combined with clinical observations, patient history, and epidemiological information. The expected result is Negative.  Fact Sheet for Patients:  EntrepreneurPulse.com.au  Fact Sheet for Healthcare Providers:  IncredibleEmployment.be  This test is no t yet approved or cleared by  the [place] FDA and  has been authorized for detection and/or diagnosis of SARS-CoV-2 by FDA under an Emergency Use Authorization (EUA). This EUA will remain  in effect (meaning this test can be used) for the duration of the COVID-19 declaration under Section 564(b)(1) of the Act, 21 U.S.C.section 360bbb-3(b)(1), unless the authorization is terminated  or revoked sooner.       Influenza A by PCR NEGATIVE NEGATIVE Final   Influenza B by PCR NEGATIVE NEGATIVE Final    Comment: (NOTE) The Xpert Xpress SARS-CoV-2/FLU/RSV plus assay is intended as an aid in the diagnosis of influenza from Nasopharyngeal swab specimens and should not be used as a sole basis for treatment. Nasal washings and aspirates are unacceptable for Xpert Xpress SARS-CoV-2/FLU/RSV testing.  Fact Sheet for Patients: EntrepreneurPulse.com.au  Fact Sheet for Healthcare Providers: IncredibleEmployment.be  This test is not yet approved or cleared by the [place] FDA and has been authorized for detection and/or diagnosis of SARS-CoV-2 by FDA under an Emergency Use Authorization (EUA). This EUA will remain in effect (meaning this test can be used) for the duration of the COVID-19 declaration under Section 564(b)(1) of the Act, 21 U.S.C. section 360bbb-3(b)(1), unless the authorization is terminated or revoked.  Performed at [facility], [place] [address] [place], [place] [address]      Labs: BNP (last 3 results) Recent Labs    03/02/20 0850  BNP 9,563.8*   Basic Metabolic Panel: Recent Labs  Lab 03/02/20 0850 03/03/20 1007 03/04/20 0304 03/05/20 0149  NA 145 143 142 143  K 4.1 3.9 4.1 4.7  CL 113* 114* 112* 114*  CO2 20* 20* 18* 17*  GLUCOSE 89 86 82 82  BUN 47* 42* 40* 39*  CREATININE 5.38* 5.08* 4.97* 4.42*  CALCIUM 8.5* 8.3* 8.4* 8.5*  MG  --  1.8 1.8 1.9  PHOS  --   --  4.1 3.8   Liver Function Tests: Recent Labs  Lab 03/02/20 0850  AST  25  ALT 14  ALKPHOS 106  BILITOT 0.8  PROT 7.6  ALBUMIN 2.6*   No results for input(s): LIPASE, AMYLASE in the last 168 hours. No results for input(s): AMMONIA in the last 168 hours. CBC: Recent Labs  Lab 03/02/20 0850 03/03/20 1007 03/04/20 0304 03/05/20 0149  WBC 9.6 7.7 7.0 6.7  NEUTROABS  --  4.9 3.9 4.0  HGB 8.5* 9.0* 8.3* 8.6*  HCT 28.8* 29.5* 28.1* 29.0*  MCV 98.3 96.1 96.9 97.6  PLT 253 246 219 220   Cardiac Enzymes: No results for input(s): CKTOTAL, CKMB, CKMBINDEX, TROPONINI in the last 168 hours. BNP: Invalid input(s): POCBNP CBG: No results for input(s): GLUCAP in the last 168 hours. D-Dimer No results for input(s): DDIMER in the last 72 hours. Hgb A1c No results for input(s): HGBA1C in the last 72 hours. Lipid Profile No results for input(s): CHOL, HDL, LDLCALC,  TRIG, CHOLHDL, LDLDIRECT in the last 72 hours. Thyroid function studies No results for input(s): TSH, T4TOTAL, T3FREE, THYROIDAB in the last 72 hours.  Invalid input(s): FREET3 Anemia work up No results for input(s): VITAMINB12, FOLATE, FERRITIN, TIBC, IRON, RETICCTPCT in the last 72 hours. Urinalysis    Component Value Date/Time   COLORURINE YELLOW 03/02/2020 1803   APPEARANCEUR HAZY (A) 03/02/2020 1803   LABSPEC 1.011 03/02/2020 1803   PHURINE 8.0 03/02/2020 1803   GLUCOSEU NEGATIVE 03/02/2020 1803   HGBUR SMALL (A) 03/02/2020 1803   BILIRUBINUR NEGATIVE 03/02/2020 1803   KETONESUR NEGATIVE 03/02/2020 1803   PROTEINUR 100 (A) 03/02/2020 1803   UROBILINOGEN 0.2 06/18/2011 1751   NITRITE NEGATIVE 03/02/2020 1803   LEUKOCYTESUR TRACE (A) 03/02/2020 1803   Sepsis Labs Invalid input(s): PROCALCITONIN,  WBC,  LACTICIDVEN Microbiology Recent Results (from the past 240 hour(s))  Resp Panel by RT-PCR (Flu A&B, Covid) Nasopharyngeal Swab     Status: None   Collection Time: 03/02/20  9:53 AM   Specimen: Nasopharyngeal Swab; Nasopharyngeal(NP) swabs in vial transport medium  Result Value Ref  Range Status   SARS Coronavirus 2 by RT PCR NEGATIVE NEGATIVE Final    Comment: (NOTE) SARS-CoV-2 target nucleic acids are NOT DETECTED.  The SARS-CoV-2 RNA is generally detectable in upper respiratory specimens during the acute phase of infection. The lowest concentration of SARS-CoV-2 viral copies this assay can detect is 138 copies/mL. A negative result does not preclude SARS-Cov-2 infection and should not be used as the sole basis for treatment or other patient management decisions. A negative result may occur with  improper specimen collection/handling, submission of specimen other than nasopharyngeal swab, presence of viral mutation(s) within the areas targeted by this assay, and inadequate number of viral copies(<138 copies/mL). A negative result must be combined with clinical observations, patient history, and epidemiological information. The expected result is Negative.  Fact Sheet for Patients:  EntrepreneurPulse.com.au  Fact Sheet for Healthcare Providers:  IncredibleEmployment.be  This test is no t yet approved or cleared by the [place] FDA and  has been authorized for detection and/or diagnosis of SARS-CoV-2 by FDA under an Emergency Use Authorization (EUA). This EUA will remain  in effect (meaning this test can be used) for the duration of the COVID-19 declaration under Section 564(b)(1) of the Act, 21 U.S.C.section 360bbb-3(b)(1), unless the authorization is terminated  or revoked sooner.       Influenza A by PCR NEGATIVE NEGATIVE Final   Influenza B by PCR NEGATIVE NEGATIVE Final    Comment: (NOTE) The Xpert Xpress SARS-CoV-2/FLU/RSV plus assay is intended as an aid in the diagnosis of influenza from Nasopharyngeal swab specimens and should not be used as a sole basis for treatment. Nasal washings and aspirates are unacceptable for Xpert Xpress SARS-CoV-2/FLU/RSV testing.  Fact Sheet for  Patients: EntrepreneurPulse.com.au  Fact Sheet for Healthcare Providers: IncredibleEmployment.be  This test is not yet approved or cleared by the [place] FDA and has been authorized for detection and/or diagnosis of SARS-CoV-2 by FDA under an Emergency Use Authorization (EUA). This EUA will remain in effect (meaning this test can be used) for the duration of the COVID-19 declaration under Section 564(b)(1) of the Act, 21 U.S.C. section 360bbb-3(b)(1), unless the authorization is terminated or revoked.  Performed at [facility], [place] [address] [place], [place] [address]     Procedures/Studies: CT Head Wo Contrast  Result Date: 03/02/2020 CLINICAL DATA:  Mental status change, unknown cause. Additional history provided: Slurred speech  for 1 week. EXAM: CT HEAD WITHOUT CONTRAST TECHNIQUE: Contiguous axial images were obtained from the base of the skull through the vertex without intravenous contrast. COMPARISON:  Brain MRI 04/07/2019. FINDINGS: Brain: Mild cerebral and cerebellar atrophy. Advanced ill-defined hypoattenuation within the cerebral white matter is nonspecific, but compatible with chronic small vessel ischemic disease. Redemonstrated chronic infarct within the inferior right cerebellar hemisphere. Additional tiny chronic infarcts within the bilateral cerebellar hemispheres were better appreciated on the prior MRI of 04/07/2019. There is no acute intracranial hemorrhage. No demarcated cortical infarct. No extra-axial fluid collection. No evidence of intracranial mass. No midline shift. Partially empty sella turcica. Vascular: No hyperdense vessel.  Atherosclerotic calcifications. Skull: Normal. Negative for fracture or focal lesion. Sinuses/Orbits: Visualized orbits show no acute finding. As before, there is near complete opacification of the left maxillary sinus with associated chronic reactive osteitis. IMPRESSION: No evidence of acute  intracranial abnormality. Mild atrophy of the brain and advanced cerebral white matter chronic small vessel ischemic disease. Redemonstrated chronic infarct within the inferior right cerebellar hemisphere. Additional tiny chronic infarcts within the bilateral cerebellar hemispheres were better appreciated on the prior MRI of 04/07/2019. Severe chronic left maxillary sinusitis. Electronically Signed   By: Kellie Simmering DO   On: 03/02/2020 10:32   CT Chest Wo Contrast  Result Date: 03/02/2020 CLINICAL DATA:  Altered mental status and slurred speech. Current chest radiograph demonstrates interstitial opacities suspicious for edema or atypical infection. EXAM: CT CHEST WITHOUT CONTRAST TECHNIQUE: Multidetector CT imaging of the chest was performed following the standard protocol without IV contrast. COMPARISON:  Current and prior chest radiographs. FINDINGS: Cardiovascular: Mild cardiomegaly. Small pericardial effusion. Mild 2 vessel coronary artery calcifications. Aorta is normal in caliber. There are aortic atherosclerotic calcifications. Main pulmonary artery is dilated to 4.1 cm. Mediastinum/Nodes: Prominent neck base lymph nodes, largest on the left, 8 mm in short axis. There are prominent to mildly enlarged mediastinal lymph nodes, largest 2 are right paratracheal, 1.3 and 1.2 cm in short axes. No mediastinal or hilar masses. Esophagus is distended with fluid and nondependent air. No esophageal mass or wall thickening. Trachea is unremarkable. Lungs/Pleura: Mild linear and reticular opacities are noted at the bases consistent with atelectasis and/or scarring. There are few additional areas of peripheral reticular lung opacity consistent with scarring most evident in the anterior upper lobes. No evidence of pneumonia or pulmonary edema. No lung mass or suspicious nodule. Mild centrilobular and paraseptal emphysema. No pleural effusion or pneumothorax. Upper Abdomen: Multiple gallstones. There is hazy opacity  surrounding the gallbladder. Wall is somewhat ill-defined. Musculoskeletal: No fracture or acute finding. No osteoblastic or osteolytic lesions. No chest wall masses. IMPRESSION: 1. No evidence of pneumonia or pulmonary edema. 2. Cholelithiasis with hazy opacity surrounding the gallbladder concerning for inflammation. Consider acute cholecystitis if there are consistent clinical findings, which could be further assessed with right upper quadrant ultrasound. 3. Mild cardiomegaly and small pericardial effusion. 4. Enlarged main pulmonary artery suggesting pulmonary artery hypertension. 5. Mild emphysema.  Aortic atherosclerotic calcifications. Aortic Atherosclerosis (ICD10-I70.0) and Emphysema (ICD10-J43.9). Electronically Signed   By: Lajean Manes M.D.   On: 03/02/2020 13:45   MR Brain Wo Contrast (neuro protocol)  Result Date: 03/02/2020 CLINICAL DATA:  Mental status change EXAM: MRI HEAD WITHOUT CONTRAST TECHNIQUE: Multiplanar, multiecho pulse sequences of the brain and surrounding structures were obtained without intravenous contrast. COMPARISON:  04/07/2019 FINDINGS: Brain: There is no acute infarction or intracranial hemorrhage. Moderate size chronic right cerebellar infarct. Additional tiny bilateral chronic cerebellar  infarcts. Patchy and confluent areas of T2 hyperintensity supratentorial white matter are nonspecific but probably reflect stable moderate to advanced chronic microvascular ischemic changes. There is no intracranial mass, mass effect, or edema. There is no hydrocephalus or extra-axial fluid collection. Ventricles and sulci are stable in size and configuration. Vascular: Major vessel flow voids at the skull base are preserved. Skull and upper cervical spine: Normal marrow signal is preserved. Sinuses/Orbits: Chronic left maxillary sinusitis with persistent circumferential mucosal thickening. Minor mucosal thickening elsewhere. Orbits are unremarkable. Other: Incidental note is made of partial  empty sella. Mastoid air cells are clear. IMPRESSION: No acute infarction, hemorrhage, or mass. Chronic cerebellar infarcts. Moderate to advanced chronic microvascular ischemic changes. Electronically Signed   By: Macy Mis M.D.   On: 03/02/2020 18:19   US RENAL  Result Date: 03/02/2020 CLINICAL DATA:  Acute renal injury EXAM: RENAL / URINARY TRACT ULTRASOUND COMPLETE COMPARISON:  02/08/2019 FINDINGS: Right Kidney: Renal measurements: 6.6 x 4.3 x 4.4 cm. = volume: 60 mL. Diffuse increased echogenicity is noted. Cortical thinning is seen as well. No mass lesion or hydronephrosis is noted. Left Kidney: Renal measurements: 6.8 x 3.9 x 3.2 cm. = volume: 46 mL. Increased echogenicity is noted. No mass lesion or hydronephrosis is seen. Bladder: Appears normal for degree of bladder distention. Other: None. IMPRESSION: Small kidneys with increased echogenicity consistent with chronic medical renal disease. No obstructive changes are noted. Electronically Signed   By: Inez Catalina M.D.   On: 03/02/2020 22:08   DG Chest Port 1 View  Result Date: 03/02/2020 CLINICAL DATA:  Altered mental status EXAM: PORTABLE CHEST 1 VIEW COMPARISON:  Chest radiograph February 05, 2019 FINDINGS: Cardiomegaly similar priors. Left greater than right interstitial opacities. No visible pneumothorax or pleural effusion. No acute osseous abnormality. IMPRESSION: Left greater than right interstitial opacities, which may reflect edema or atypical infection. Electronically Signed   By: Dahlia Bailiff MD   On: 03/02/2020 10:02     Time coordinating discharge: Over 30 minutes    Dwyane Dee, MD  [facility] 03/05/2020, 2:44 PM

## 2020-03-30 ENCOUNTER — Inpatient Hospital Stay (HOSPITAL_COMMUNITY)
Admission: EM | Admit: 2020-03-30 | Discharge: 2020-04-04 | DRG: 177 | Disposition: A | Discharge status: Home Health | Payer: Medicare Other | Attending: Internal Medicine | Admitting: Internal Medicine

## 2020-03-30 ENCOUNTER — Emergency Department (HOSPITAL_COMMUNITY): Payer: Medicare Other

## 2020-03-30 ENCOUNTER — Other Ambulatory Visit: Payer: Self-pay

## 2020-03-30 DIAGNOSIS — Z7982 Long term (current) use of aspirin: Secondary | ICD-10-CM

## 2020-03-30 DIAGNOSIS — E785 Hyperlipidemia, unspecified: Secondary | ICD-10-CM | POA: Diagnosis present

## 2020-03-30 DIAGNOSIS — I272 Pulmonary hypertension, unspecified: Secondary | ICD-10-CM | POA: Diagnosis present

## 2020-03-30 DIAGNOSIS — Z79899 Other long term (current) drug therapy: Secondary | ICD-10-CM

## 2020-03-30 DIAGNOSIS — I132 Hypertensive heart and chronic kidney disease with heart failure and with stage 5 chronic kidney disease, or end stage renal disease: Secondary | ICD-10-CM | POA: Diagnosis present

## 2020-03-30 DIAGNOSIS — Z87440 Personal history of urinary (tract) infections: Secondary | ICD-10-CM

## 2020-03-30 DIAGNOSIS — Z87891 Personal history of nicotine dependence: Secondary | ICD-10-CM | POA: Diagnosis not present

## 2020-03-30 DIAGNOSIS — I480 Paroxysmal atrial fibrillation: Secondary | ICD-10-CM | POA: Diagnosis present

## 2020-03-30 DIAGNOSIS — I739 Peripheral vascular disease, unspecified: Secondary | ICD-10-CM | POA: Diagnosis present

## 2020-03-30 DIAGNOSIS — G9349 Other encephalopathy: Secondary | ICD-10-CM | POA: Diagnosis present

## 2020-03-30 DIAGNOSIS — J440 Chronic obstructive pulmonary disease with acute lower respiratory infection: Secondary | ICD-10-CM | POA: Diagnosis present

## 2020-03-30 DIAGNOSIS — G9340 Encephalopathy, unspecified: Secondary | ICD-10-CM

## 2020-03-30 DIAGNOSIS — I471 Supraventricular tachycardia: Secondary | ICD-10-CM | POA: Diagnosis present

## 2020-03-30 DIAGNOSIS — R0602 Shortness of breath: Secondary | ICD-10-CM | POA: Diagnosis present

## 2020-03-30 DIAGNOSIS — Z6830 Body mass index (BMI) 30.0-30.9, adult: Secondary | ICD-10-CM

## 2020-03-30 DIAGNOSIS — D631 Anemia in chronic kidney disease: Secondary | ICD-10-CM | POA: Diagnosis present

## 2020-03-30 DIAGNOSIS — J1282 Pneumonia due to coronavirus disease 2019: Secondary | ICD-10-CM | POA: Diagnosis present

## 2020-03-30 DIAGNOSIS — N185 Chronic kidney disease, stage 5: Secondary | ICD-10-CM | POA: Diagnosis not present

## 2020-03-30 DIAGNOSIS — Z8249 Family history of ischemic heart disease and other diseases of the circulatory system: Secondary | ICD-10-CM | POA: Diagnosis not present

## 2020-03-30 DIAGNOSIS — I10 Essential (primary) hypertension: Secondary | ICD-10-CM | POA: Diagnosis present

## 2020-03-30 DIAGNOSIS — F0150 Vascular dementia without behavioral disturbance: Secondary | ICD-10-CM | POA: Diagnosis present

## 2020-03-30 DIAGNOSIS — Z89612 Acquired absence of left leg above knee: Secondary | ICD-10-CM

## 2020-03-30 DIAGNOSIS — U071 COVID-19: Principal | ICD-10-CM | POA: Diagnosis present

## 2020-03-30 DIAGNOSIS — E669 Obesity, unspecified: Secondary | ICD-10-CM | POA: Diagnosis present

## 2020-03-30 DIAGNOSIS — Z833 Family history of diabetes mellitus: Secondary | ICD-10-CM

## 2020-03-30 DIAGNOSIS — Z885 Allergy status to narcotic agent status: Secondary | ICD-10-CM

## 2020-03-30 DIAGNOSIS — I5032 Chronic diastolic (congestive) heart failure: Secondary | ICD-10-CM | POA: Diagnosis present

## 2020-03-30 DIAGNOSIS — G9341 Metabolic encephalopathy: Secondary | ICD-10-CM | POA: Diagnosis present

## 2020-03-30 DIAGNOSIS — Z89619 Acquired absence of unspecified leg above knee: Secondary | ICD-10-CM

## 2020-03-30 LAB — COMPREHENSIVE METABOLIC PANEL
ALT: 15 U/L (ref 0–44)
AST: 28 U/L (ref 15–41)
Albumin: 2.9 g/dL — ABNORMAL LOW (ref 3.5–5.0)
Alkaline Phosphatase: 135 U/L — ABNORMAL HIGH (ref 38–126)
Anion gap: 12 (ref 5–15)
BUN: 45 mg/dL — ABNORMAL HIGH (ref 8–23)
CO2: 20 mmol/L — ABNORMAL LOW (ref 22–32)
Calcium: 8.9 mg/dL (ref 8.9–10.3)
Chloride: 110 mmol/L (ref 98–111)
Creatinine, Ser: 4.73 mg/dL — ABNORMAL HIGH (ref 0.44–1.00)
GFR, Estimated: 9 mL/min — ABNORMAL LOW (ref >60)
Glucose, Bld: 93 mg/dL (ref 70–99)
Potassium: 4 mmol/L (ref 3.5–5.1)
Sodium: 142 mmol/L (ref 135–145)
Total Bilirubin: 0.7 mg/dL (ref 0.3–1.2)
Total Protein: 8.3 g/dL — ABNORMAL HIGH (ref 6.5–8.1)

## 2020-03-30 LAB — LACTIC ACID, PLASMA: Lactic Acid, Venous: 1.9 mmol/L (ref 0.5–1.9)

## 2020-03-30 LAB — URINALYSIS, ROUTINE W REFLEX MICROSCOPIC
Bacteria, UA: NONE SEEN
Bilirubin Urine: NEGATIVE
Glucose, UA: 50 mg/dL — AB
Ketones, ur: NEGATIVE mg/dL
Leukocytes,Ua: NEGATIVE
Nitrite: NEGATIVE
Protein, ur: 30 mg/dL — AB
Specific Gravity, Urine: 1.009 (ref 1.005–1.030)
pH: 6 (ref 5.0–8.0)

## 2020-03-30 LAB — CBC WITH DIFFERENTIAL/PLATELET
Abs Immature Granulocytes: 0.09 10*3/uL — ABNORMAL HIGH (ref 0.00–0.07)
Basophils Absolute: 0.1 10*3/uL (ref 0.0–0.1)
Basophils Relative: 1 %
Eosinophils Absolute: 0.3 10*3/uL (ref 0.0–0.5)
Eosinophils Relative: 3 %
HCT: 31.3 % — ABNORMAL LOW (ref 36.0–46.0)
Hemoglobin: 9.3 g/dL — ABNORMAL LOW (ref 12.0–15.0)
Immature Granulocytes: 1 %
Lymphocytes Relative: 8 %
Lymphs Abs: 0.9 10*3/uL (ref 0.7–4.0)
MCH: 28.3 pg (ref 26.0–34.0)
MCHC: 29.7 g/dL — ABNORMAL LOW (ref 30.0–36.0)
MCV: 95.1 fL (ref 80.0–100.0)
Monocytes Absolute: 1.3 10*3/uL — ABNORMAL HIGH (ref 0.1–1.0)
Monocytes Relative: 12 %
Neutro Abs: 7.9 10*3/uL — ABNORMAL HIGH (ref 1.7–7.7)
Neutrophils Relative %: 75 %
Platelets: 257 10*3/uL (ref 150–400)
RBC: 3.29 MIL/uL — ABNORMAL LOW (ref 3.87–5.11)
RDW: 15.5 % (ref 11.5–15.5)
WBC: 10.5 10*3/uL (ref 4.0–10.5)
nRBC: 0 % (ref 0.0–0.2)

## 2020-03-30 LAB — CBG MONITORING, ED: Glucose-Capillary: 67 mg/dL — ABNORMAL LOW (ref 70–99)

## 2020-03-30 LAB — APTT: aPTT: 37 seconds — ABNORMAL HIGH (ref 24–36)

## 2020-03-30 LAB — POC SARS CORONAVIRUS 2 AG -  ED: SARS Coronavirus 2 Ag: POSITIVE — AB

## 2020-03-30 LAB — PROTIME-INR
INR: 1.2 (ref 0.8–1.2)
Prothrombin Time: 14.9 seconds (ref 11.4–15.2)

## 2020-03-30 IMAGING — DX DG CHEST 1V PORT
1 series · 1 of 1 positions shown · non-contrast
Comparison: [DATE].

CLINICAL DATA: Sepsis.

EXAM:
PORTABLE CHEST 1 VIEW

[chest]
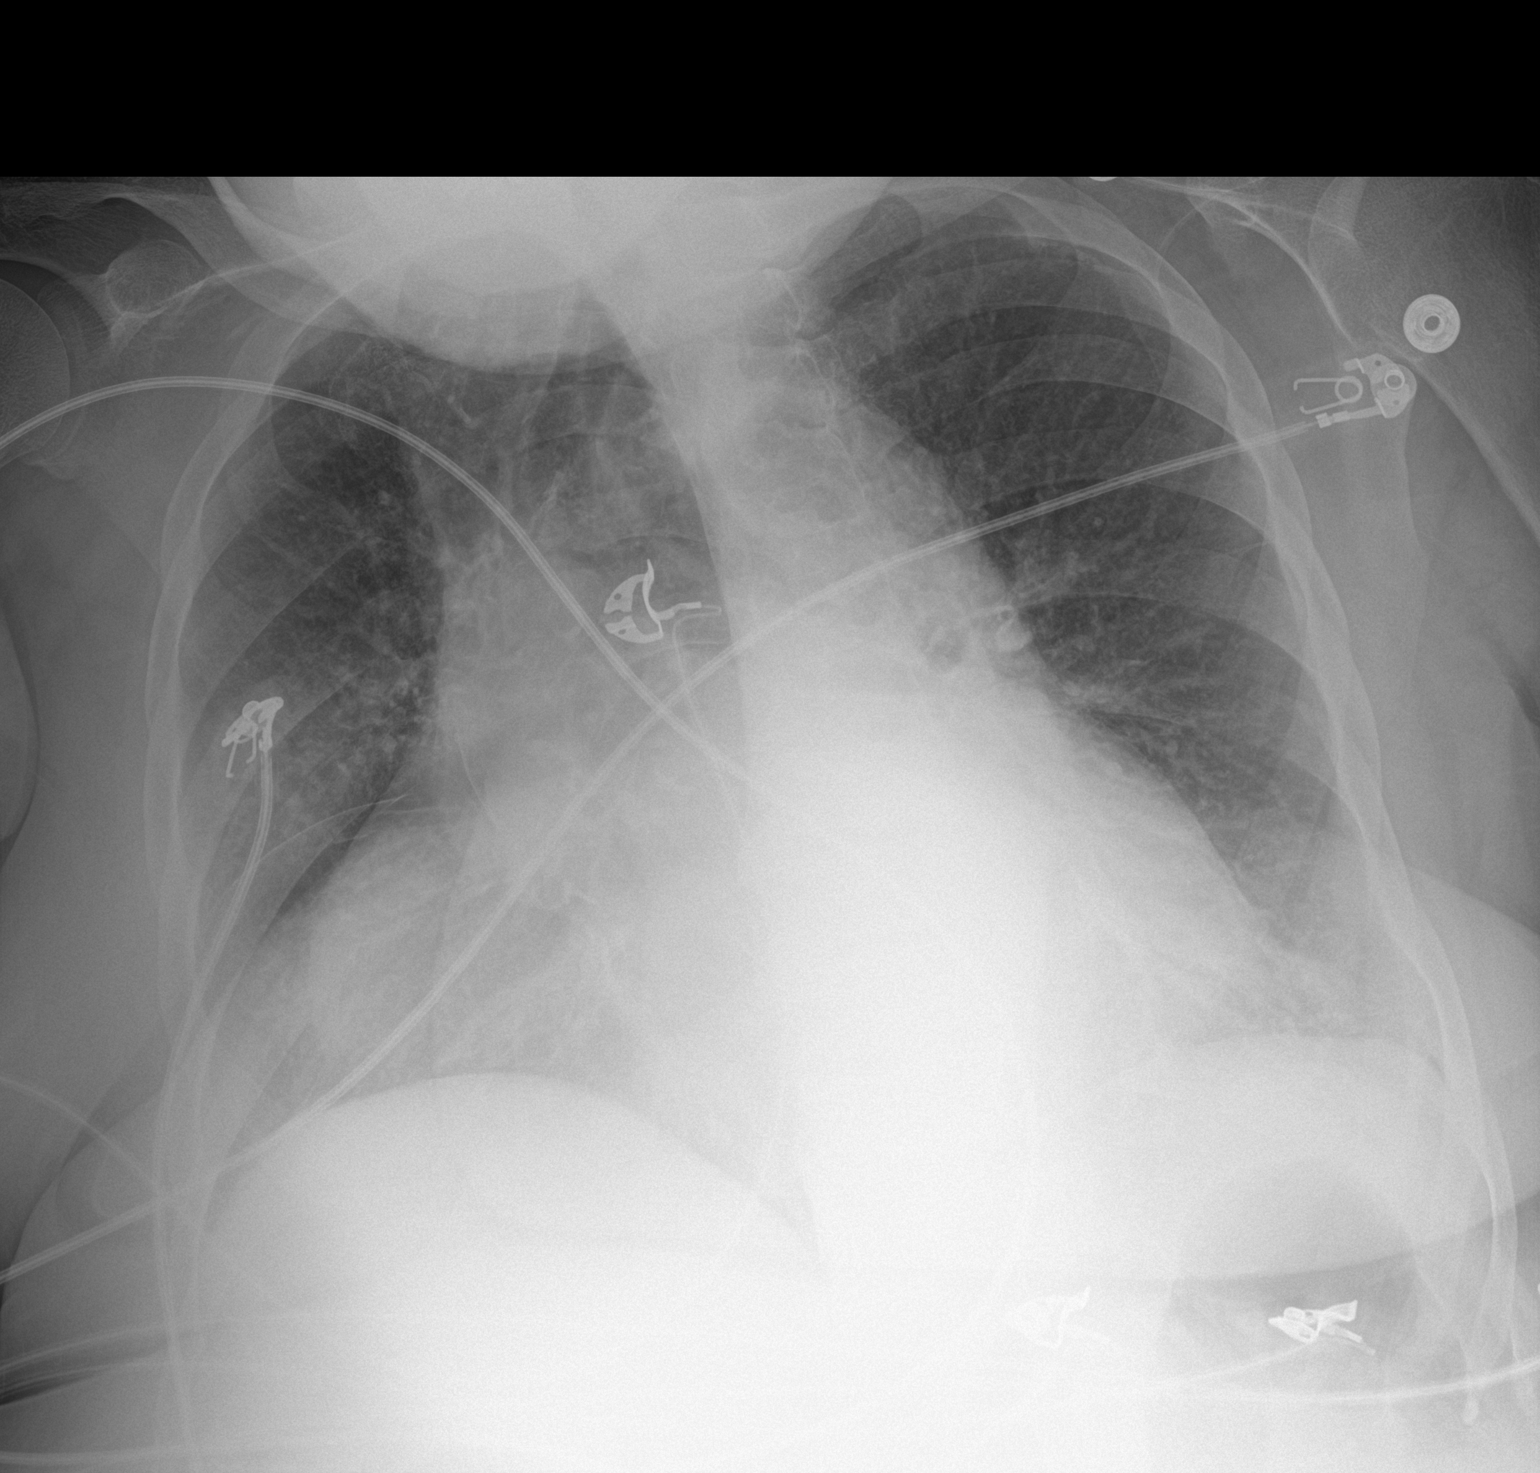

[1 of 1 positions shown; findings below may reference images not displayed]

FINDINGS: Stable cardiomegaly. No pneumothorax or pleural effusion is noted.
Both lungs are clear. The visualized skeletal structures are
unremarkable.
IMPRESSION: No active disease.

## 2020-03-30 IMAGING — CT CT HEAD W/O CM
4 series · 16 of 47 positions shown, 18 images · non-contrast
Comparison: CT and MRI [DATE].

CLINICAL DATA: Mental status change.

EXAM:
CT HEAD WITHOUT CONTRAST
TECHNIQUE: Contiguous axial images were obtained from the base of the skull
through the vertex without intravenous contrast.

[Series 3: head wo · axial · 0.45mm/px · z∈[+1248,+1368]mm · 7 of 33 slices shown, 9 images]
[im 5/33  brain]
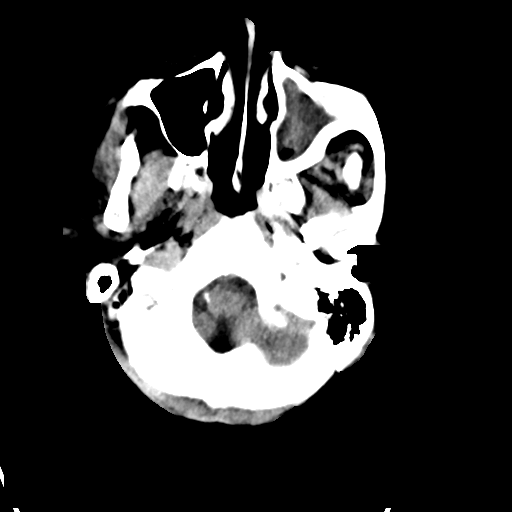
[im 5/33  bone]
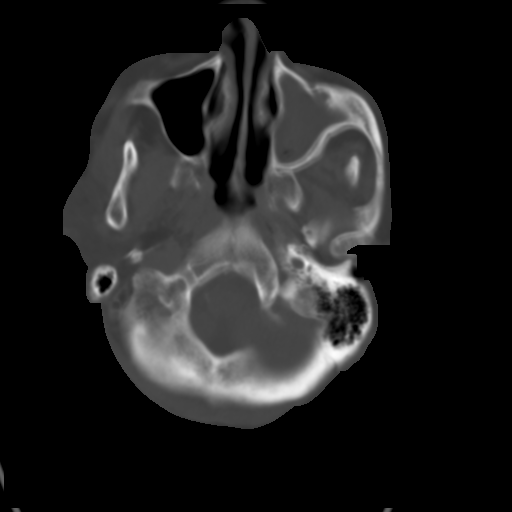
[im 9/33  brain]
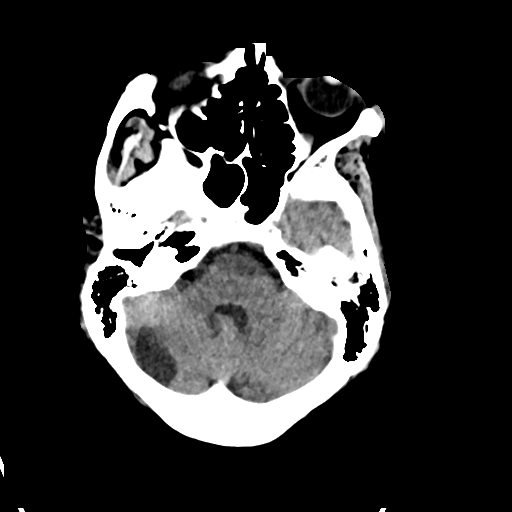
[im 13/33  brain]
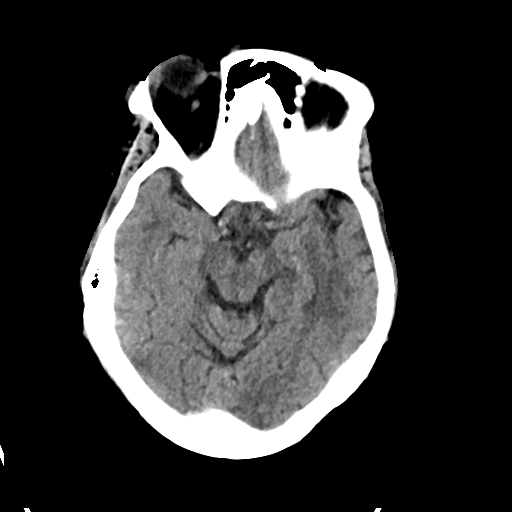
[im 17/33  brain]
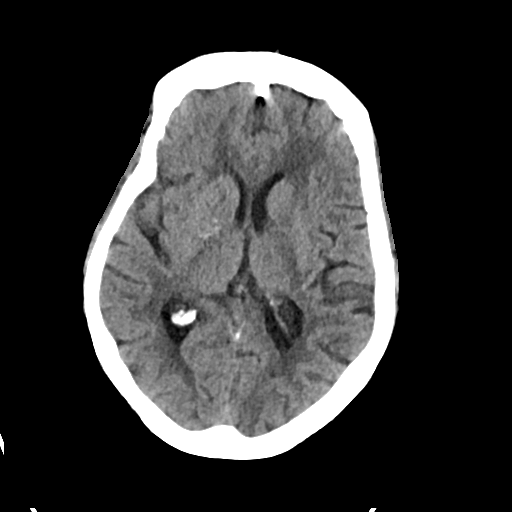
[im 21/33  brain]
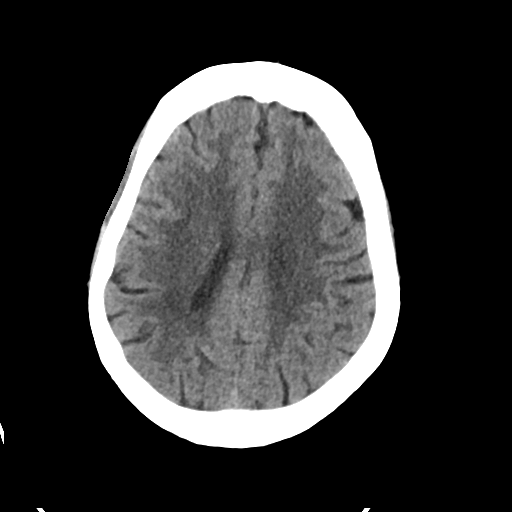
[im 21/33  bone]
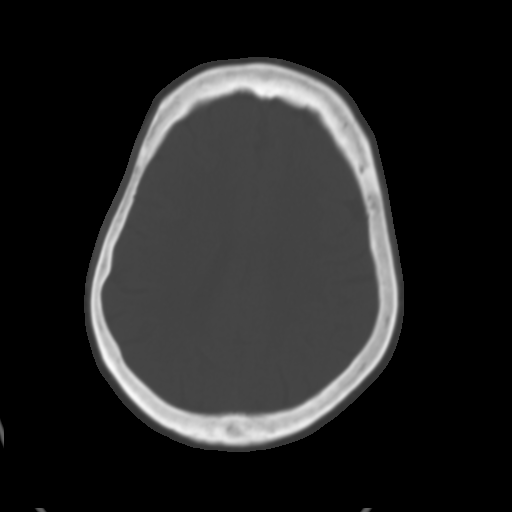
[im 25/33  brain]
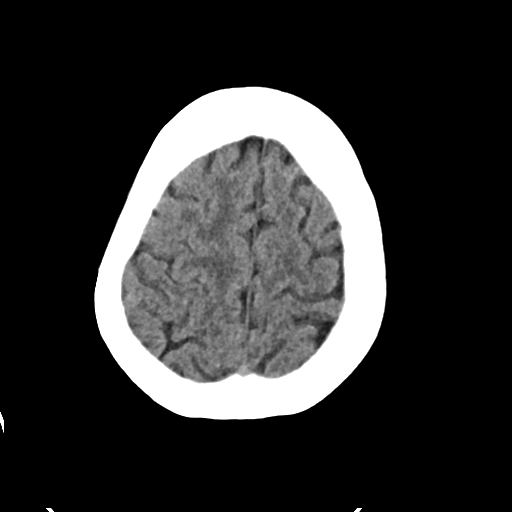
[im 29/33  brain]
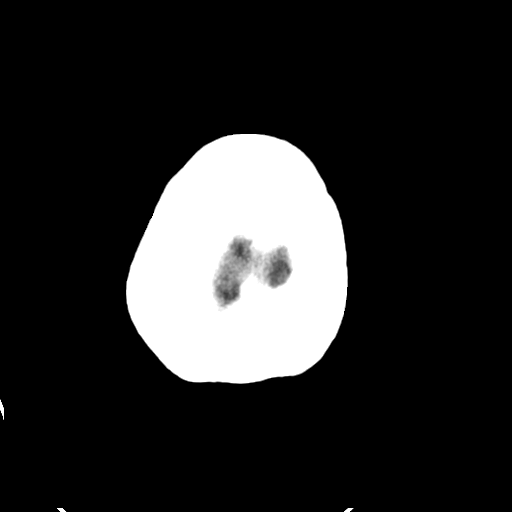

[Series 4: head bone · axial · 0.45mm/px · z∈[+1244,+1276]mm · 3 of 82 slices shown]
[im 9/82  bone]
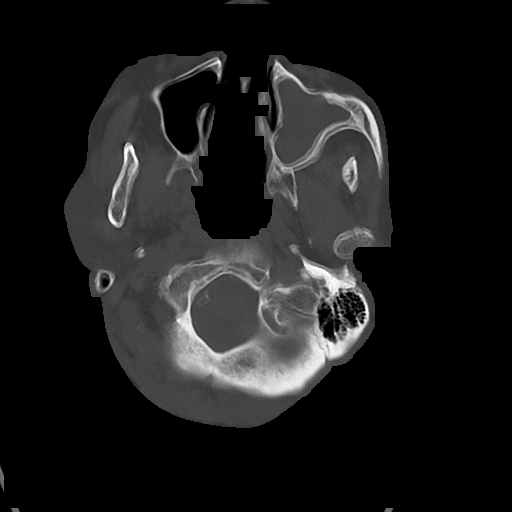
[im 17/82  bone]
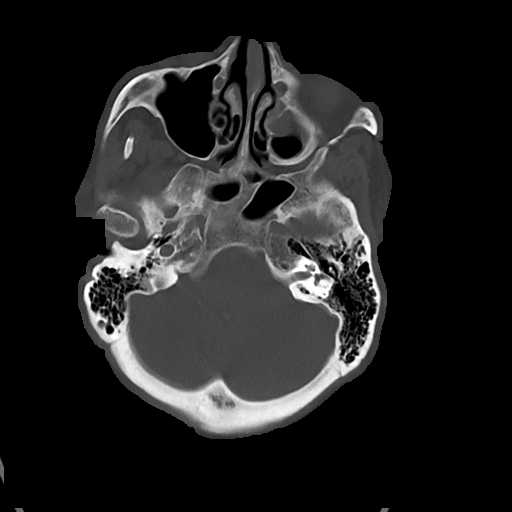
[im 25/82  bone]
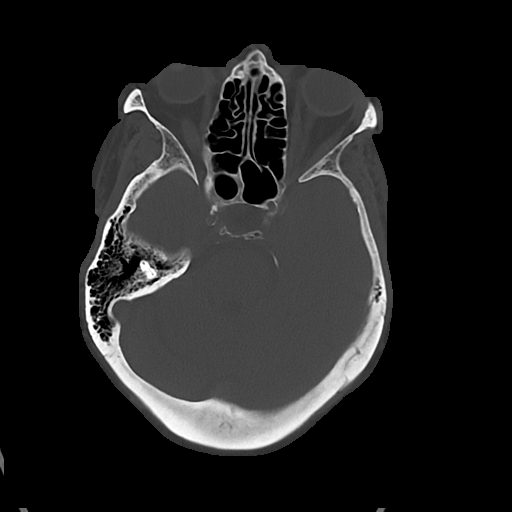

[Series 5: cor soft · coronal · 0.37mm/px · 3 of 83 slices shown]
[im 28/83  brain]
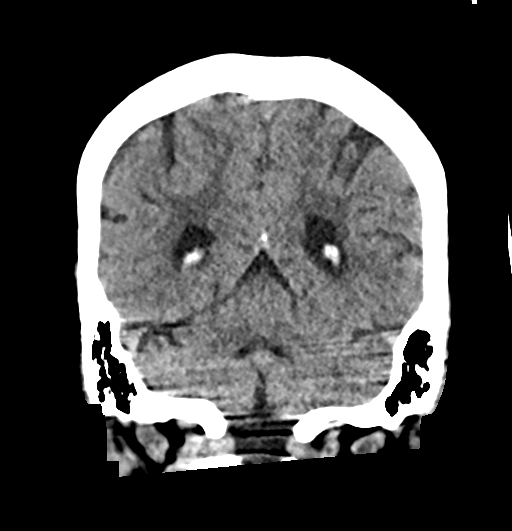
[im 37/83  brain]
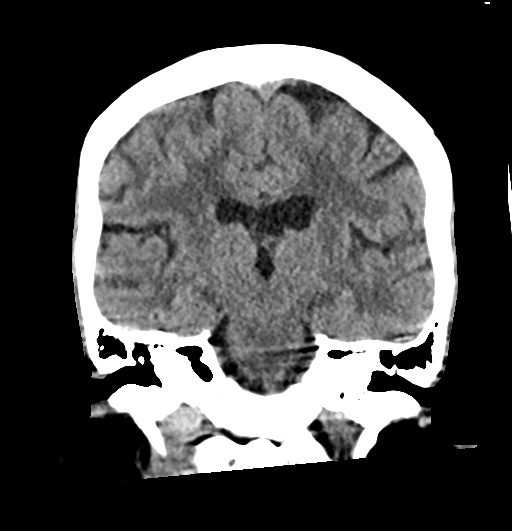
[im 46/83  brain]
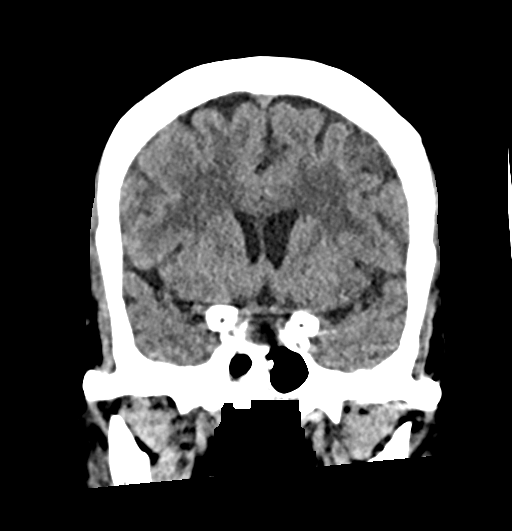

[Series 6: sag soft · sagittal · 0.37mm/px · 3 of 61 slices shown]
[im 21/61  brain]
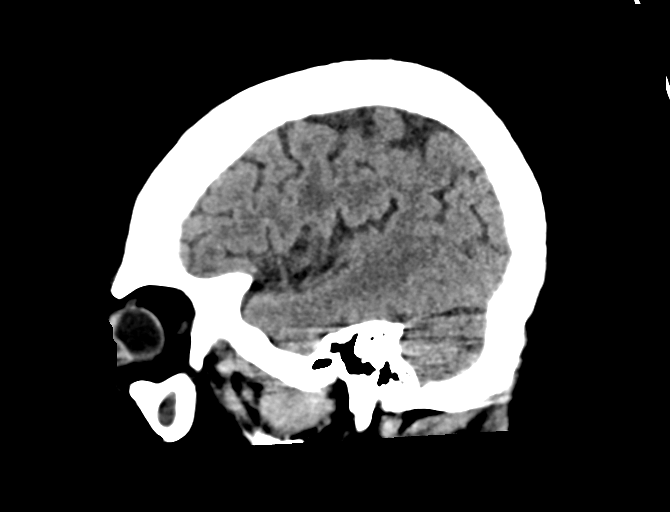
[im 31/61  brain]
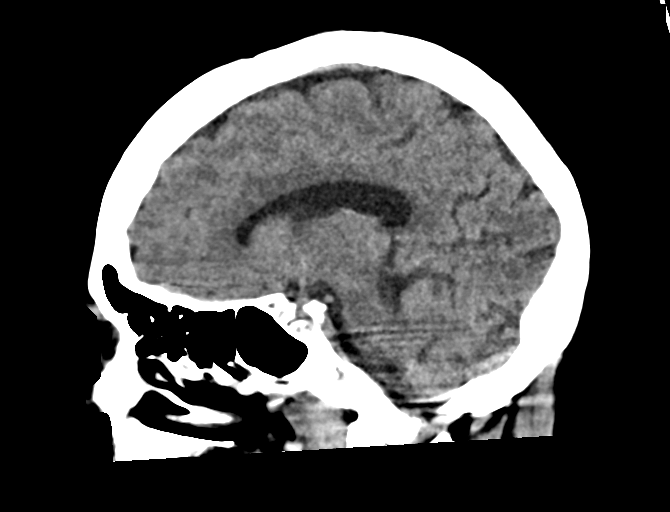
[im 41/61  brain]
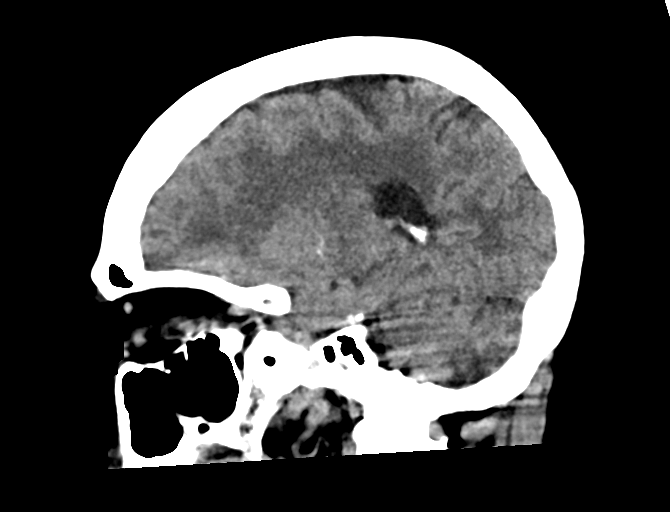

[16 of 47 positions shown; findings below may reference images not displayed]

FINDINGS: Brain: No evidence of acute large vascular territory infarction,
hemorrhage, hydrocephalus, extra-axial collection or mass
lesion/mass effect. Similar patchy white matter hypoattenuation,
most likely related to chronic microvascular ischemic disease.
Similar remote infarcts in the cerebellum, largest on the right.

Vascular: Calcific atherosclerosis. No hyperdense vessel identified.

Skull: No acute fracture.

Sinuses/Orbits: Near complete opacification left maxillary sinus
with internal hyperdensity and maxillary sinus wall thickening
suggesting chronicity. Unremarkable orbits.

Other: No mastoid effusions.
IMPRESSION: No evidence of acute intracranial abnormality.

Chronic cerebellar infarcts and moderate to advanced chronic
microvascular ischemic disease.

Left maxillary sinus disease. Internal hyperdensity may represent
inspissated secretions and/or fungal colonization.

## 2020-03-30 MED ORDER — SODIUM CHLORIDE 0.9 % IV SOLN: 250.0000 mL | INTRAVENOUS | Status: DC | PRN

## 2020-03-30 MED ORDER — HEPARIN SODIUM (PORCINE) 5000 UNIT/ML IJ SOLN
5000.0000 [IU] | Freq: Three times a day (TID) | INTRAMUSCULAR | Status: DC
  Administered 2020-03-30 – 2020-04-04 (×14): 5000 [IU] via SUBCUTANEOUS
  Filled 2020-03-30 (×13): qty 1

## 2020-03-30 MED ORDER — ASCORBIC ACID 500 MG PO TABS
500.0000 mg | ORAL_TABLET | Freq: Every day | ORAL | Status: DC
  Administered 2020-03-31 – 2020-04-04 (×5): 500 mg via ORAL
  Filled 2020-03-30 (×5): qty 1

## 2020-03-30 MED ORDER — DEXTROSE 50 % IV SOLN: 1.0000 | Freq: Once | INTRAVENOUS | Status: AC

## 2020-03-30 MED ORDER — BISACODYL 5 MG PO TBEC: 5.0000 mg | DELAYED_RELEASE_TABLET | Freq: Every day | ORAL | Status: DC | PRN

## 2020-03-30 MED ORDER — ENOXAPARIN SODIUM 40 MG/0.4ML ~~LOC~~ SOLN: 40.0000 mg | SUBCUTANEOUS | Status: DC

## 2020-03-30 MED ORDER — FLEET ENEMA 7-19 GM/118ML RE ENEM: 1.0000 | ENEMA | Freq: Once | RECTAL | Status: DC | PRN

## 2020-03-30 MED ORDER — SODIUM CHLORIDE 0.9% FLUSH
3.0000 mL | INTRAVENOUS | Status: DC | PRN
  Administered 2020-03-30: 3 mL via INTRAVENOUS

## 2020-03-30 MED ORDER — AMLODIPINE BESYLATE 10 MG PO TABS
10.0000 mg | ORAL_TABLET | Freq: Every day | ORAL | Status: DC
Start: 2020-03-31 — End: 2020-04-04
  Administered 2020-03-31 – 2020-04-04 (×5): 10 mg via ORAL
  Filled 2020-03-30 (×2): qty 1
  Filled 2020-03-30: qty 2
  Filled 2020-03-30 (×2): qty 1

## 2020-03-30 MED ORDER — ATORVASTATIN CALCIUM 40 MG PO TABS
40.0000 mg | ORAL_TABLET | Freq: Every day | ORAL | Status: DC
  Administered 2020-03-31 – 2020-04-03 (×4): 40 mg via ORAL
  Filled 2020-03-30 (×3): qty 1

## 2020-03-30 MED ORDER — HYDRALAZINE HCL 20 MG/ML IJ SOLN
5.0000 mg | INTRAMUSCULAR | Status: DC | PRN
  Administered 2020-04-03: 5 mg via INTRAVENOUS
  Filled 2020-03-30: qty 1

## 2020-03-30 MED ORDER — SODIUM CHLORIDE 0.9% FLUSH
3.0000 mL | Freq: Two times a day (BID) | INTRAVENOUS | Status: DC
  Administered 2020-03-30 – 2020-04-03 (×6): 3 mL via INTRAVENOUS

## 2020-03-30 MED ORDER — SODIUM CHLORIDE 0.9% FLUSH
3.0000 mL | Freq: Two times a day (BID) | INTRAVENOUS | Status: DC
  Administered 2020-03-30 – 2020-04-02 (×6): 3 mL via INTRAVENOUS

## 2020-03-30 MED ORDER — HALOPERIDOL LACTATE 5 MG/ML IJ SOLN
2.0000 mg | Freq: Four times a day (QID) | INTRAMUSCULAR | Status: DC | PRN
  Administered 2020-03-30 – 2020-04-02 (×2): 2 mg via INTRAVENOUS
  Filled 2020-03-30 (×3): qty 1

## 2020-03-30 MED ORDER — ALBUTEROL SULFATE HFA 108 (90 BASE) MCG/ACT IN AERS
2.0000 | INHALATION_SPRAY | RESPIRATORY_TRACT | Status: DC | PRN
  Administered 2020-04-02 – 2020-04-03 (×2): 2 via RESPIRATORY_TRACT
  Filled 2020-03-30: qty 6.7

## 2020-03-30 MED ORDER — SODIUM CHLORIDE 0.9 % IV SOLN
200.0000 mg | Freq: Once | INTRAVENOUS | Status: AC
  Administered 2020-03-30: 200 mg via INTRAVENOUS
  Filled 2020-03-30: qty 40

## 2020-03-30 MED ORDER — ASPIRIN EC 81 MG PO TBEC
81.0000 mg | DELAYED_RELEASE_TABLET | Freq: Every day | ORAL | Status: DC
  Administered 2020-03-31 – 2020-04-04 (×5): 81 mg via ORAL
  Filled 2020-03-30 (×5): qty 1

## 2020-03-30 MED ORDER — HYDROCOD POLST-CPM POLST ER 10-8 MG/5ML PO SUER: 5.0000 mL | Freq: Two times a day (BID) | ORAL | Status: DC | PRN

## 2020-03-30 MED ORDER — SODIUM CHLORIDE 0.9 % IV SOLN
100.0000 mg | Freq: Every day | INTRAVENOUS | Status: DC
  Administered 2020-03-31 – 2020-04-03 (×4): 100 mg via INTRAVENOUS
  Filled 2020-03-30: qty 100
  Filled 2020-03-30 (×2): qty 20

## 2020-03-30 MED ORDER — GABAPENTIN 100 MG PO CAPS
100.0000 mg | ORAL_CAPSULE | Freq: Every day | ORAL | Status: DC
  Administered 2020-03-31 – 2020-04-03 (×4): 100 mg via ORAL
  Filled 2020-03-30 (×4): qty 1

## 2020-03-30 MED ORDER — ZINC SULFATE 220 (50 ZN) MG PO CAPS
220.0000 mg | ORAL_CAPSULE | Freq: Every day | ORAL | Status: DC
  Administered 2020-03-31 – 2020-04-04 (×5): 220 mg via ORAL
  Filled 2020-03-30 (×5): qty 1

## 2020-03-30 MED ORDER — METOPROLOL TARTRATE 50 MG PO TABS
50.0000 mg | ORAL_TABLET | Freq: Two times a day (BID) | ORAL | Status: DC
  Administered 2020-03-31 – 2020-04-03 (×8): 50 mg via ORAL
  Filled 2020-03-30 (×6): qty 1
  Filled 2020-03-30: qty 2
  Filled 2020-03-30: qty 1

## 2020-03-30 MED ORDER — CALCITRIOL 0.25 MCG PO CAPS
0.5000 ug | ORAL_CAPSULE | Freq: Every day | ORAL | Status: DC
Start: 2020-03-31 — End: 2020-04-04
  Administered 2020-03-31 – 2020-04-04 (×5): 0.5 ug via ORAL
  Filled 2020-03-30 (×4): qty 2
  Filled 2020-03-30: qty 1

## 2020-03-30 MED ORDER — SODIUM CHLORIDE 0.9 % IV SOLN: 2.0000 g | INTRAVENOUS | Status: DC

## 2020-03-30 MED ORDER — ACETAMINOPHEN 325 MG PO TABS: 650.0000 mg | ORAL_TABLET | Freq: Four times a day (QID) | ORAL | Status: DC | PRN

## 2020-03-30 MED ORDER — GUAIFENESIN-DM 100-10 MG/5ML PO SYRP: 10.0000 mL | ORAL_SOLUTION | ORAL | Status: DC | PRN

## 2020-03-30 MED ORDER — POLYETHYLENE GLYCOL 3350 17 G PO PACK
17.0000 g | PACK | Freq: Every day | ORAL | Status: DC | PRN
Start: 2020-03-30 — End: 2020-04-04

## 2020-03-30 MED ORDER — METRONIDAZOLE IN NACL 5-0.79 MG/ML-% IV SOLN
500.0000 mg | Freq: Once | INTRAVENOUS | Status: AC
  Administered 2020-03-30: 500 mg via INTRAVENOUS
  Filled 2020-03-30: qty 100

## 2020-03-30 MED ORDER — OXYCODONE HCL 5 MG PO TABS
5.0000 mg | ORAL_TABLET | ORAL | Status: DC | PRN
Start: 2020-03-30 — End: 2020-04-04

## 2020-03-30 MED ORDER — METHYLPREDNISOLONE SODIUM SUCC 125 MG IJ SOLR
0.5000 mg/kg | Freq: Two times a day (BID) | INTRAMUSCULAR | Status: DC
  Administered 2020-03-30 – 2020-04-02 (×5): 45 mg via INTRAVENOUS
  Filled 2020-03-30 (×5): qty 2

## 2020-03-30 MED ORDER — SODIUM BICARBONATE 650 MG PO TABS
650.0000 mg | ORAL_TABLET | Freq: Three times a day (TID) | ORAL | Status: DC
  Administered 2020-03-30 – 2020-03-31 (×2): 650 mg via ORAL
  Filled 2020-03-30 (×5): qty 1

## 2020-03-30 MED ORDER — ONDANSETRON HCL 4 MG/2ML IJ SOLN: 4.0000 mg | Freq: Four times a day (QID) | INTRAMUSCULAR | Status: DC | PRN

## 2020-03-30 MED ORDER — PREDNISONE 5 MG PO TABS
50.0000 mg | ORAL_TABLET | Freq: Every day | ORAL | Status: DC
  Filled 2020-03-30: qty 2

## 2020-03-30 MED ORDER — SODIUM CHLORIDE 0.9 % IV BOLUS (SEPSIS)
1000.0000 mL | Freq: Once | INTRAVENOUS | Status: AC
Start: 2020-03-30 — End: 2020-03-30
  Administered 2020-03-30: 1000 mL via INTRAVENOUS

## 2020-03-30 MED ORDER — DOCUSATE SODIUM 100 MG PO CAPS
100.0000 mg | ORAL_CAPSULE | Freq: Two times a day (BID) | ORAL | Status: DC
  Administered 2020-03-31 – 2020-04-04 (×7): 100 mg via ORAL
  Filled 2020-03-30 (×7): qty 1

## 2020-03-30 MED ORDER — VANCOMYCIN HCL 1750 MG/350ML IV SOLN
1750.0000 mg | Freq: Once | INTRAVENOUS | Status: AC
  Administered 2020-03-30: 1750 mg via INTRAVENOUS
  Filled 2020-03-30: qty 350

## 2020-03-30 MED ORDER — DEXTROSE 50 % IV SOLN
INTRAVENOUS | Status: AC
  Administered 2020-03-30: 50 mL via INTRAVENOUS
  Filled 2020-03-30: qty 50

## 2020-03-30 MED ORDER — VANCOMYCIN HCL IN DEXTROSE 1-5 GM/200ML-% IV SOLN: 1000.0000 mg | Freq: Once | INTRAVENOUS | Status: DC

## 2020-03-30 MED ORDER — ACETAMINOPHEN 650 MG RE SUPP
650.0000 mg | Freq: Once | RECTAL | Status: AC
  Administered 2020-03-30: 650 mg via RECTAL
  Filled 2020-03-30: qty 1

## 2020-03-30 MED ORDER — ONDANSETRON HCL 4 MG PO TABS: 4.0000 mg | ORAL_TABLET | Freq: Four times a day (QID) | ORAL | Status: DC | PRN

## 2020-03-30 MED ORDER — SODIUM CHLORIDE 0.9 % IV SOLN
2.0000 g | Freq: Once | INTRAVENOUS | Status: AC
  Administered 2020-03-30: 2 g via INTRAVENOUS
  Filled 2020-03-30: qty 2

## 2020-03-30 MED ORDER — SODIUM CHLORIDE 0.9 % IV SOLN: INTRAVENOUS | Status: DC

## 2020-03-30 NOTE — ED Provider Notes (Signed)
  Physical Exam  BP (!) 171/75   Pulse 88   Temp (!) 100.8 F (38.2 C) (Rectal)   Resp 20   Wt 89.8 kg   SpO2 95%   BMI 30.11 kg/m   Physical Exam  ED Course/Procedures   Clinical Course as of 03/30/20 1534  Wed Mar 30, 2020  1413 Glucose-Capillary(!): 67 [MV]  1533 SARS Coronavirus 2 Ag(!): POSITIVE [JS]    Clinical Course User Index [JS] Janeece Fitting, PA-C [MV] Eustaquio Maize, PA-C    Procedures  MDM  Patient care assumed from [place] PA at shift change, please see her note for a full HPI. Briefly, patient here with AMS from home.  Patient is a recent admission for UTI, she did arrive with altered mental status on initial evaluation.  Extensive review of her chart.  Received 300 mL of normal saline by EMS, was afebrile.  Plan is for follow-up on labs, urine, COVID-19.  Prior to calling [facility] has spoken to daughter Glenard Haring who has been informed of status at this time.  3:32 PM Covid 19+ positive  Interpreted for CBC without any leukocytosis, hemoglobin is at her baseline.  PTT slightly elevated.  PT/INR is within normal limits.  Lactic acid is normal.  4:28 PM patient assessed by me, she is states "I got no Covid ", I discussed with patient her results were positive and she will be admitted to the hospital for further management.  Patient is able to follow commands at this time however  CT head showed: No evidence of acute intracranial abnormality.    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    Left maxillary sinus disease. Internal hyperdensity may represent  inspissated secretions and/or fungal colonization.   Broad-spectrum antibiotics have been given to patient, she has received Tylenol to help with fever.  Upon reassessment of her oxygen saturation, she is satting at 98% on room air, mildly tachypneic.  Remains febrile.  4:58 PM Spoke to Dr. Lorin Mercy from hospitalist service who will admit patient for further  management.   BP (!) 179/84   Pulse (!) 132   Temp (!) 100.8 F (38.2 C) (Rectal)   Resp (!) 23   Ht 5\' 8"  (1.727 m)   Wt 89.8 kg   SpO2 100%   BMI 30.10 kg/m     Portions of this note were generated with Lobbyist. Dictation errors may occur despite best attempts at proofreading.     Janeece Fitting, PA-C 03/30/20 [place], [place], DO 03/30/20 2327

## 2020-03-30 NOTE — ED Provider Notes (Addendum)
[facility] EMERGENCY DEPARTMENT Provider Note   CSN: [id number] Arrival date & time: 03/30/20  1346     History Chief Complaint  Patient presents with  . Code Sepsis   LEVEL 5 CAVEAT - Montague is a 74 y.o. female with PMHx HTN, HLD, PAD, COPD, CHF who presents to the ED today via EMS for altered mental status. Per EMS pt lives with family - called out as pt was disoriented x 4 and typically A&Ox4. With EMS she was found to be tachycardic and febrile and presented as a code sepsis. EMS reports pt has had previous UTI in the past; unsure if she is on antibiotics at this time.   Additional information obtained by family members - reports that pt began coughing and acting confused 3 days ago. They were unaware she had a fever until EMS got there. They deny any recent sick contacts. She is vaccinated against COVID x 2; most recent in October. They deny any other symptoms at this time.   Per chart review: Pt was admitted from 12/01-12/04 for worsening mentation, agitation, and change in behavior. She was found to have a UTI as well as elevated creatinine above her baseline. She underwent renal ultrasound at that time which showed findings consistent with chronic medical renal disease. She was discharged with [facility] and had completed the course without issue until 3 days ago.   The history is provided by medical records and the EMS personnel. The history is limited by the condition of the patient.       Past Medical History:  Diagnosis Date  . Anemia of chronic disease   . Chronic diastolic CHF (congestive heart failure) ([place])   . Chronic renal insufficiency, stage IV (severe) ([place])   . COPD (chronic obstructive pulmonary disease) ([place])   . Hyperglycemia   . Hyperlipidemia   . Hypertension   . PAD (peripheral artery disease) ([place])    a. s/p femoral to below knee popliteal bypass then L AKA.  Marland Kitchen PAF (paroxysmal atrial fibrillation) ([place])    . PAT (paroxysmal atrial tachycardia) ([place])   . Pulmonary hypertension ([place])   . Umbilical hernia     Patient Active Problem List   Diagnosis Date Noted  . Acute renal failure superimposed on stage 5 chronic kidney disease, not on chronic dialysis ([place]) 03/03/2020  . Chronic diastolic CHF (congestive heart failure) ([place]) 03/03/2020  . Acute metabolic encephalopathy 25/95/6387  . Acute on chronic diastolic CHF (congestive heart failure) ([place]) 02/06/2019  . Chronic renal insufficiency, stage IV (severe) ([place]) 02/06/2019  . Metabolic acidosis 56/43/3295  . Pressure injury of skin 01/03/2016  . Acute hypoxemic respiratory failure ([place])   . Acute pulmonary edema (H[facility])   . Hyperkalemia 12/30/2015  . Incarcerated hernia 12/30/2015  . Phantom limb pain ([place]) 08/13/2011  . PVD (peripheral vascular disease) with claudication ([place]) 07/26/2011  . S/P AKA (above knee amputation) ([place]) 06/15/2011  . NSTEMI (non-ST elevated myocardial infarction) ([place]) 06/09/2011  . Acute diastolic heart failure ([place]) 06/09/2011  . Atrial fibrillation ([place]) 06/08/2011  . Cholelithiasis and cholecystitis without obstruction 06/03/2011  . Ventral hernia 06/03/2011  . Acute on chronic renal failure ([place]) 06/02/2011  . PAD (peripheral artery disease) ([place]) 06/02/2011  . Dry gangrene ([place]) 05/31/2011  . Abdominal pain 05/31/2011  . Leukocytosis 05/31/2011  . Hyponatremia 05/31/2011  . Hyperglycemia 05/31/2011  . Normocytic anemia 12/22/2007  . Chronic kidney disease (CKD), stage III (moderate) ([place]) 12/22/2007  .  CHEST PAIN, HX OF 12/22/2007  . Essential hypertension, benign 12/10/2007    Past Surgical History:  Procedure Laterality Date  . AMPUTATION  06/06/2011   Procedure: AMPUTATION DIGIT;  Surgeon: Elam Dutch, MD;  Location: [facility] OR;  Service: Vascular;  Laterality: Left;  Transmetatarsal amputation left great toe  . AMPUTATION  06/12/2011   Procedure: AMPUTATION ABOVE KNEE;  Surgeon: Elam Dutch, MD;  Location: [facility];  Service: Vascular;  Laterality: Left;  . FEMORAL-POPLITEAL BYPASS GRAFT  06/06/2011   Procedure: BYPASS GRAFT FEMORAL-POPLITEAL ARTERY;  Surgeon: Elam Dutch, MD;  Location: [facility] OR;  Service: Vascular;  Laterality: Left;  left femoral to popliteal bypass with composite vein and propaten 45mm x 80 graft  . LOWER EXTREMITY ANGIOGRAM N/A 06/04/2011   Procedure: LOWER EXTREMITY ANGIOGRAM;  Surgeon: Angelia Mould, MD;  Location: [facility] CATH LAB;  Service: Cardiovascular;  Laterality: N/A;  . OMENTECTOMY  12/31/2015   Procedure: Partial OMENTECTOMY;  Surgeon: Georganna Skeans, MD;  Location: [facility];  Service: General;;  . RIGHT HEART CATH N/A 02/13/2019   Procedure: RIGHT HEART CATH;  Surgeon: Jolaine Artist, MD;  Location: [facility] CV LAB;  Service: Cardiovascular;  Laterality: N/A;  . TUBAL LIGATION    . VENTRAL HERNIA REPAIR N/A 12/31/2015   Procedure: REPAIR INCARCERATED VENTRAL HERNIA, POSSIBLE BOWEL RESECTION;  Surgeon: Georganna Skeans, MD;  Location: [facility];  Service: General;  Laterality: N/A;     OB History   No obstetric history on file.     Family History  Problem Relation Age of Onset  . Heart attack Father   . Heart disease Father   . Diabetes Mother   . Hypertension Mother   . Heart attack Brother     Social History   Tobacco Use  . Smoking status: Former Smoker    Packs/day: 2.00    Years: 50.00    Pack years: 100.00    Quit date: 06/01/2011    Years since quitting: 8.8  . Smokeless tobacco: Never Used  Vaping Use  . Vaping Use: Never used  Substance Use Topics  . Alcohol use: No  . Drug use: No    Home Medications Prior to Admission medications   Medication Sig Start Date End Date Taking? Authorizing Provider  amLODipine (NORVASC) 10 MG tablet Take 10 mg by mouth daily.  07/08/14  Yes [provider]  aspirin 81 MG EC tablet Take 81 mg by mouth daily.   Yes [provider]  atorvastatin (LIPITOR) 40 MG tablet  Take 1 tablet (40 mg total) by mouth daily at 6 PM. Patient taking differently: Take 40 mg by mouth daily. 02/18/19 03/02/20 Yes Ghimire, Dante Gang, MD  calcitRIOL (ROCALTROL) 0.5 MCG capsule Take 0.5 mcg by mouth daily. 02/22/20  Yes [provider]  furosemide (LASIX) 40 MG tablet Take 1 tablet (40 mg total) by mouth daily. 02/19/19  Yes Barb Merino, MD  gabapentin (NEURONTIN) 100 MG capsule Take 100 mg by mouth daily. 06/23/14  Yes [provider]  metoprolol (LOPRESSOR) 50 MG tablet Take 1 tablet (50 mg total) by mouth 2 (two) times daily. 01/10/16  Yes Domenic Polite, MD  Multiple Vitamin (MULTIVITAMIN WITH MINERALS) TABS tablet Take 1 tablet by mouth daily.   Yes [provider]  sodium bicarbonate 650 MG tablet Take 650 mg by mouth in the morning, at noon, and at bedtime. 12/29/19  Yes [provider]    Allergies    Morphine and related  Review of Systems   Review of Systems  Unable to perform ROS: Mental status change  Constitutional: Positive for fever.  Respiratory: Positive for cough.   Psychiatric/Behavioral: Positive for confusion.    Physical Exam Updated Vital Signs BP (!) 183/91 (BP Location: Right Arm)   Pulse 93   Temp (!) 100.8 F (38.2 C) (Rectal)   Resp (!) 21   Wt 89.8 kg   SpO2 96%   BMI 30.11 kg/m   Physical Exam Vitals and nursing note reviewed.  Constitutional:      Appearance: She is ill-appearing.  HENT:     Head: Normocephalic and atraumatic.  Eyes:     Extraocular Movements: Extraocular movements intact.     Conjunctiva/sclera: Conjunctivae normal.     Pupils: Pupils are equal, round, and reactive to light.  Cardiovascular:     Rate and Rhythm: Regular rhythm. Tachycardia present.  Pulmonary:     Effort: Pulmonary effort is normal.     Breath sounds: Normal breath sounds. No wheezing, rhonchi or rales.  Abdominal:     Palpations: Abdomen is soft.     Tenderness: There is no abdominal tenderness. There is  no guarding or rebound.  Musculoskeletal:     Cervical back: Neck supple.  Skin:    General: Skin is warm and dry.  Neurological:     Mental Status: She is alert.     Comments: Pt answered "alright" to every question. Unable to follow commands.      ED Results / Procedures / Treatments   Labs (all labs ordered are listed, but only abnormal results are displayed) Labs Reviewed  CBC WITH DIFFERENTIAL/PLATELET - Abnormal; Notable for the following components:      Result Value   RBC 3.29 (*)    Hemoglobin 9.3 (*)    HCT 31.3 (*)    MCHC 29.7 (*)    Neutro Abs 7.9 (*)    Monocytes Absolute 1.3 (*)    Abs Immature Granulocytes 0.09 (*)    All other components within normal limits  APTT - Abnormal; Notable for the following components:   aPTT 37 (*)    All other components within normal limits  CBG MONITORING, ED - Abnormal; Notable for the following components:   Glucose-Capillary 67 (*)    All other components within normal limits  POC SARS CORONAVIRUS 2 AG -  ED - Abnormal; Notable for the following components:   SARS Coronavirus 2 Ag POSITIVE (*)    All other components within normal limits  CULTURE, BLOOD (ROUTINE X 2)  CULTURE, BLOOD (ROUTINE X 2)  URINE CULTURE  LACTIC ACID, PLASMA  PROTIME-INR  LACTIC ACID, PLASMA  COMPREHENSIVE METABOLIC PANEL  URINALYSIS, ROUTINE W REFLEX MICROSCOPIC    EKG None  Radiology CT Head Wo Contrast  Result Date: 03/30/2020 CLINICAL DATA:  Mental status change. EXAM: CT HEAD WITHOUT CONTRAST TECHNIQUE: Contiguous axial images were obtained from the base of the skull through the vertex without intravenous contrast. COMPARISON:  CT and MRI March 02, 2020. FINDINGS: Brain: No evidence of acute large vascular territory infarction, hemorrhage, hydrocephalus, extra-axial collection or mass lesion/mass effect. Similar patchy white matter hypoattenuation, most likely related to chronic microvascular ischemic disease. Similar remote infarcts  in the cerebellum, largest on the right. Vascular: Calcific atherosclerosis. No hyperdense vessel identified. Skull: No acute fracture. Sinuses/Orbits: Near complete opacification left maxillary sinus with internal hyperdensity and maxillary sinus wall thickening suggesting chronicity. Unremarkable orbits. Other: No mastoid effusions. IMPRESSION: No evidence of acute  intracranial abnormality. 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 Left maxillary sinus disease. Internal hyperdensity may represent inspissated secretions and/or fungal colonization. Electronically Signed   By: Margaretha Sheffield MD   On: 03/30/2020 15:11   DG Chest Port 1 View  Result Date: 03/30/2020 CLINICAL DATA:  Sepsis. EXAM: PORTABLE CHEST 1 VIEW COMPARISON:  March 02, 2020. FINDINGS: Stable cardiomegaly. No pneumothorax or pleural effusion is noted. Both lungs are clear. The visualized skeletal structures are unremarkable. IMPRESSION: No active disease. Electronically Signed   By: Marijo Conception M.D.   On: 03/30/2020 14:29    Procedures .Critical Care Performed by: Eustaquio Maize, PA-C Authorized by: Eustaquio Maize, PA-C   Critical care provider statement:    Critical care time (minutes):  45   Critical care was necessary to treat or prevent imminent or life-threatening deterioration of the following conditions:  Sepsis   Critical care was time spent personally by me on the following activities:  Discussions with consultants, evaluation of patient's response to treatment, examination of patient, ordering and performing treatments and interventions, ordering and review of laboratory studies, ordering and review of radiographic studies, pulse oximetry, re-evaluation of patient's condition, obtaining history from patient or surrogate and review of old charts   (including critical care time)  Medications Ordered in ED Medications  0.9 %  sodium chloride infusion (  Intravenous New Bag/Given 03/30/20 1430)  metroNIDAZOLE (FLAGYL) IVPB 500 mg (500 mg Intravenous New Bag/Given 03/30/20 1436)  vancomycin (VANCOREADY) IVPB 1750 mg/350 mL (has no administration in time range)  sodium chloride 0.9 % bolus 1,000 mL (1,000 mLs Intravenous New Bag/Given 03/30/20 1430)  ceFEPIme (MAXIPIME) 2 g in sodium chloride 0.9 % 100 mL IVPB (2 g Intravenous New Bag/Given 03/30/20 1434)  dextrose 50 % solution 50 mL (50 mLs Intravenous Given 03/30/20 1424)  acetaminophen (TYLENOL) suppository 650 mg (650 mg Rectal Given 03/30/20 1440)    ED Course  I have reviewed the triage vital signs and the nursing notes.  Pertinent labs & imaging results that were available during my care of the patient were reviewed by me and considered in my medical decision making (see chart for details).  Clinical Course as of 03/31/20 1510  Wed Mar 30, 2020  1413 Glucose-Capillary(!): 67 [MV]  1533 SARS Coronavirus 2 Ag(!): POSITIVE [JS]    Clinical Course User Index [JS] Janeece Fitting, PA-C [MV] Eustaquio Maize, PA-C   MDM Rules/Calculators/A&P                          74 year old female who presents to the ED today via EMS for altered mental status.  She is unable to provide any additional history as she answers "all right" to every question.  Per family she has had a cough for the past 3 days and increasing confusion.  She was just discharged from the hospital on 12/04 secondary to UTI and worsening kidney disease.  On arrival to the ED patient is found to be febrile at 100.8 and tachycardic at 120.  She is nontachypneic and satting 96% on room air.  Code sepsis was initiated due to vital signs, suspected source pulmonary with patient's recent cough.  We will plan to obtain chest x-ray, UA, CT head given increasing confusion.  Will swab for Covid.  Will start on empiric antibiotics.  Will need to be admitted.   CBG has returned at 67. Difficult to say if pt able to tolerate PO  at this time;  will provide D50.   CXR has returned negative.  CT Head negative  COVID test has returned positive at this time. Suspect this is the cause of pt's symptoms today including her increased confusion. She will need to be admitted. Have updated family at this time.   Lactic acid within normal limits at 1.9.   At shift change case signed out to [facility], [place], who will admit patient.   This note was prepared using Dragon voice recognition software and may include unintentional dictation errors due to the inherent limitations of voice recognition software.  Stephanie Griffith was evaluated in Emergency Department on 03/30/2020 for the symptoms described in the history of present illness. She was evaluated in the context of the global COVID-19 pandemic, which necessitated consideration that the patient might be at risk for infection with the SARS-CoV-2 virus that causes COVID-19. Institutional protocols and algorithms that pertain to the evaluation of patients at risk for COVID-19 are in a state of rapid change based on information released by regulatory bodies including the CDC and federal and state organizations. These policies and algorithms were followed during the patient's care in the ED.   Final Clinical Impression(s) / ED Diagnoses Final diagnoses:  COVID-19  Encephalopathy    Rx / DC Orders ED Discharge Orders    None       Eustaquio Maize, PA-C 03/30/20 1532    Gareth Morgan, MD 03/30/20 2326    Eustaquio Maize, PA-C 03/31/20 1510    Gareth Morgan, MD 03/31/20 2302

## 2020-03-30 NOTE — H&P (Addendum)
History and Physical    Stephanie Griffith QHU:[id number] DOB: 02-02-1946 DOA: 03/30/2020  PCP: [name], MD Consultants:  Oneida Alar - vascular; Hollie Salk- nephrology Patient coming from: Home - lives with daughter; NOK: Daughter, Autym Siess, [phone]; [place], [phone]  Chief Complaint: AMS  HPI: Stephanie Griffith is a 74 y.o. female with medical history significant of afib; PAD s/p L AKA; HTN; HLD; COPD; stage 4 CKD (has declined HD); and chronic chronic diastolic CHF presenting with AMS.  She was last admitted from 12/1-4 with AMS with underlying vascular dementia; possible UTI; and AKI on stage 5 CKD.  She has been worse with her AMS for the last couple of days.  She was coughing this AM with some rhinorrhea.  No known COVID contacts. Full code but she has been very clear that she would not want HD.    ED Course:  AMS.  Code sepsis on presentation, febrile, given antibiotics.  Review of Systems: Unable to perform  Ambulatory Status:  Non-ambulatory since amputation  COVID Vaccine Status:   Complete in October  Past Medical History:  Diagnosis Date  . Anemia of chronic disease   . Chronic diastolic CHF (congestive heart failure) ([place])   . Chronic renal insufficiency, stage IV (severe) ([place])   . COPD (chronic obstructive pulmonary disease) ([place])   . Hyperglycemia   . Hyperlipidemia   . Hypertension   . PAD (peripheral artery disease) ([place])    a. s/p femoral to below knee popliteal bypass then L AKA.  Marland Kitchen PAF (paroxysmal atrial fibrillation) ([place])   . PAT (paroxysmal atrial tachycardia) ([place])   . Pulmonary hypertension ([place])   . Umbilical hernia     Past Surgical History:  Procedure Laterality Date  . AMPUTATION  06/06/2011   Procedure: AMPUTATION DIGIT;  Surgeon: Elam Dutch, MD;  Location: [facility] OR;  Service: Vascular;  Laterality: Left;  Transmetatarsal amputation left great toe  . AMPUTATION  06/12/2011   Procedure: AMPUTATION ABOVE KNEE;  Surgeon: Elam Dutch,  MD;  Location: [place];  Service: Vascular;  Laterality: Left;  . FEMORAL-POPLITEAL BYPASS GRAFT  06/06/2011   Procedure: BYPASS GRAFT FEMORAL-POPLITEAL ARTERY;  Surgeon: Elam Dutch, MD;  Location: [facility] OR;  Service: Vascular;  Laterality: Left;  left femoral to popliteal bypass with composite vein and propaten 65mm x 80 graft  . LOWER EXTREMITY ANGIOGRAM N/A 06/04/2011   Procedure: LOWER EXTREMITY ANGIOGRAM;  Surgeon: Angelia Mould, MD;  Location: [facility] CATH LAB;  Service: Cardiovascular;  Laterality: N/A;  . OMENTECTOMY  12/31/2015   Procedure: Partial OMENTECTOMY;  Surgeon: Georganna Skeans, MD;  Location: [place];  Service: General;;  . RIGHT HEART CATH N/A 02/13/2019   Procedure: RIGHT HEART CATH;  Surgeon: Jolaine Artist, MD;  Location: [place] CV LAB;  Service: Cardiovascular;  Laterality: N/A;  . TUBAL LIGATION    . VENTRAL HERNIA REPAIR N/A 12/31/2015   Procedure: REPAIR INCARCERATED VENTRAL HERNIA, POSSIBLE BOWEL RESECTION;  Surgeon: Georganna Skeans, MD;  Location: [place];  Service: General;  Laterality: N/A;    Social History   Socioeconomic History  . Marital status: Married    Spouse name: Not on file  . Number of children: Not on file  . Years of education: Not on file  . Highest education level: Not on file  Occupational History  . Not on file  Tobacco Use  . Smoking status: Former Smoker    Packs/day: 2.00    Years: 50.00    Pack  years: 100.00    Quit date: 06/01/2011    Years since quitting: 8.8  . Smokeless tobacco: Never Used  Vaping Use  . Vaping Use: Never used  Substance and Sexual Activity  . Alcohol use: No  . Drug use: No  . Sexual activity: Not Currently  Other Topics Concern  . Not on file  Social History Narrative  . Not on file   Social Determinants of Health   Financial Resource Strain: Not on file  Food Insecurity: Not on file  Transportation Needs: Not on file  Physical Activity: Not on file  Stress: Not on file  Social Connections:  Not on file  Intimate Partner Violence: Not on file    Allergies  Allergen Reactions  . Morphine And Related Other (See Comments)    AMS    Family History  Problem Relation Age of Onset  . Heart attack Father   . Heart disease Father   . Diabetes Mother   . Hypertension Mother   . Heart attack Brother     Prior to Admission medications   Medication Sig Start Date End Date Taking? Authorizing Provider  amLODipine (NORVASC) 10 MG tablet Take 10 mg by mouth daily.  07/08/14  Yes [provider]  aspirin 81 MG EC tablet Take 81 mg by mouth daily.   Yes [provider]  atorvastatin (LIPITOR) 40 MG tablet Take 1 tablet (40 mg total) by mouth daily at 6 PM. Patient taking differently: Take 40 mg by mouth daily. 02/18/19 03/02/20 Yes Ghimire, Dante Gang, MD  calcitRIOL (ROCALTROL) 0.5 MCG capsule Take 0.5 mcg by mouth daily. 02/22/20  Yes [provider]  furosemide (LASIX) 40 MG tablet Take 1 tablet (40 mg total) by mouth daily. 02/19/19  Yes Barb Merino, MD  gabapentin (NEURONTIN) 100 MG capsule Take 100 mg by mouth daily. 06/23/14  Yes [provider]  metoprolol (LOPRESSOR) 50 MG tablet Take 1 tablet (50 mg total) by mouth 2 (two) times daily. 01/10/16  Yes Domenic Polite, MD  Multiple Vitamin (MULTIVITAMIN WITH MINERALS) TABS tablet Take 1 tablet by mouth daily.   Yes [provider]  sodium bicarbonate 650 MG tablet Take 650 mg by mouth in the morning, at noon, and at bedtime. 12/29/19  Yes [provider]    Physical Exam: Vitals:   03/30/20 1745 03/30/20 1800 03/30/20 1815 03/30/20 1830  BP: (!) 158/93 (!) 172/76 (!) 165/69 (!) 173/71  Pulse: 94 95 (!) 116 85  Resp: 18 18 17 15   Temp:      TempSrc:      SpO2: 100% 98% 100% 93%  Weight:      Height:         . General:  Appears agitated but alert and oriented x 1, able to have some conversation . Eyes:  PERRL, EOMI, normal lids, iris . ENT:  grossly normal hearing, lips &  tongue, mildly dry mm; very poor dentition . Neck:  no LAD, masses or thyromegaly . Cardiovascular:  RRR, no m/r/g. No LE edema.  Marland Kitchen Respiratory:   CTA bilaterally with no wheezes/rales/rhonchi.  Normal respiratory effort. . Abdomen:  soft, NT, ND, NABS . Skin:  no rash or induration seen on limited exam . Musculoskeletal:  S/p L AKA, stump well healed and without ulceration; RLE with stasis dermatitis but no apparent ulcerations . Psychiatric: mildly agitated mood and affect, speech fluent but somewhat inappropriate, AOx1 . Neurologic:  Unable to effectively perform    Radiological Exams on  Admission: Independently reviewed - see discussion in A/P where applicable  CT Head Wo Contrast  Result Date: 03/30/2020 CLINICAL DATA:  Mental status change. EXAM: CT HEAD WITHOUT CONTRAST TECHNIQUE: Contiguous axial images were obtained from the base of the skull through the vertex without intravenous contrast. COMPARISON:  CT and MRI March 02, 2020. FINDINGS: Brain: No evidence of acute large vascular territory infarction, hemorrhage, hydrocephalus, extra-axial collection or mass lesion/mass effect. Similar patchy white matter hypoattenuation, most likely related to chronic microvascular ischemic disease. Similar remote infarcts in the cerebellum, largest on the right. Vascular: Calcific atherosclerosis. No hyperdense vessel identified. Skull: No acute fracture. Sinuses/Orbits: Near complete opacification left maxillary sinus with internal hyperdensity and maxillary sinus wall thickening suggesting chronicity. Unremarkable orbits. Other: No mastoid effusions. IMPRESSION: No evidence of acute intracranial abnormality. 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 Left maxillary sinus disease. Internal hyperdensity may represent inspissated secretions and/or fungal colonization. Electronically Signed   By: Margaretha Sheffield MD   On: 03/30/2020 15:11   DG Chest  Port 1 View  Result Date: 03/30/2020 CLINICAL DATA:  Sepsis. EXAM: PORTABLE CHEST 1 VIEW COMPARISON:  March 02, 2020. FINDINGS: Stable cardiomegaly. No pneumothorax or pleural effusion is noted. Both lungs are clear. The visualized skeletal structures are unremarkable. IMPRESSION: No active disease. Electronically Signed   By: Marijo Conception M.D.   On: 03/30/2020 14:29    EKG: Independently reviewed.  NSR with rate 92; nonspecific ST changes with no evidence of acute ischemia   Labs on Admission: I have personally reviewed the available labs and imaging studies at the time of the admission.  Pertinent labs:   BUN 45/Creatinine 4.73/GFR 9 - stable Albumin 2.9 WBC 10.5 Hgb 9.3 INR 1.2 UA: 50 glucose, small Hgb, 30 protein   Assessment/Plan Principal Problem:   Encephalopathy due to COVID-19 virus Active Problems:   Essential hypertension, benign   Chronic kidney disease with symptom management only, stage 5 (HCC)   Atrial fibrillation (HCC)   S/P AKA (above knee amputation) (HCC)   PVD (peripheral vascular disease) with claudication (HCC)   Chronic diastolic CHF (congestive heart failure) (HCC)   Encephalopathy due to COVID-19  -Patient with presenting with AMS -Mild anorexia noted without the presence of other GI symptoms -She does not have a current O2 requirement -COVID POSITIVE -The patient has comorbidities which may increase the risk for ARDS/MODS including: age, HTN, obesity, CKD, CHF -Pertinent labs concerning for COVID include normal WBC count; additional COVID labs are pending -CXR is unremarkable -Will not treat with broad-spectrum antibiotics if procalcitonin <0.5 -Will admit for further evaluation, close monitoring, and treatment -Monitor on telemetry x at least 24 hours -At this time, will attempt to avoid use of aerosolized medications and use HFAs instead -Will check daily labs including BMP with Mag, Phos; LFTs; CBC with differential; CRP; ferritin;  fibrinogen; D-dimer -Will order steroids and Remdesivir (pharmacy consult) given +COVID test, encephalopathy, and hypoxia <94% on room air -Will attempt to maintain euvolemia to a net negative fluid status -Patient was seen wearing full PPE including: gown, gloves, head cover, N95, and face shield; donning and doffing was in compliance with current standards. -Haldol and telesitter for agitation  Stage 5 CKD -Appears to be at/near baseline at this time -Will follow -Has been clear that she would not desire HD, according to her daughter -Continue calcitriol, bicarbonate  HTN -Continue Norvasc, Lopressor  PVD s/p AKA -s/p L AKA -Continue ASA  HLD -Continue Lipitor  Chronic diastolic CHF -Hold Lasix for now, appears to be compensated to mildly dry -Preserved EF in 02/2019     DVT prophylaxis:  Heparin Code Status:  Full - confirmed with family Family Communication: None present; I spoke with the patient's daughter by telephone. Disposition Plan:  The patient is from: home  Anticipated d/c is to: home without [facility] services  Anticipated d/c date will depend on clinical response to treatment, likely between 3 days (with completion of outpatient Remdesivir treatment) and 5 days  Patient is currently: acutely ill Consults called: None  Admission status: Admit - It is my clinical opinion that admission to [place] is reasonable and necessary because of the expectation that this patient will require hospital care that crosses at least 2 midnights to treat this condition based on the medical complexity of the problems presented.  Given the aforementioned information, the predictability of an adverse outcome is felt to be significant.     Karmen Bongo MD [facility]   How to contact the [facility] Attending or Consulting provider [facility] or covering provider during after hours [place], for this patient?  1. Check the care team in [facility] and look for a) attending/consulting [facility] provider  listed and b) the [facility] team listed 2. Log into [email or web address] and use [place]'s universal password to access. If you do not have the password, please contact the hospital operator. 3. Locate the [facility] provider you are looking for under [facility] and page to a number that you can be directly reached. 4. If you still have difficulty reaching the provider, please page the [facility] (Director on Call) for the Hospitalists listed on amion for assistance.   03/30/2020, 7:59 PM

## 2020-03-30 NOTE — ED Triage Notes (Signed)
Patient brought in [place], lives with family. Patient baseline A0x4, per EMS patient alert but disoriented x4.   Patient VSS at this time, tachycardiac upon arrival, febrile per EMS 101.64F.  356mL NS given.  Per EMS patient has had previous UTI in the past, unsure of vax status.   Lives with family per EMS.

## 2020-03-30 NOTE — ED Notes (Signed)
Pt very agitated and yelling randomly. Pt pulling at Wires and IV Lines. Placing Soft Hand Control Mittens on Pt

## 2020-03-30 NOTE — ED Notes (Addendum)
Noticed Pt's Pulse Ox was down but with bad wave form, so went to check on Pt and Pulse Ox was completely off patient laying on bed. Placed a new Pulse Ox on Pt and it went up to 98% on room air.

## 2020-03-30 NOTE — ED Notes (Signed)
Pt received with mittens in place, removed at 21:40.

## 2020-03-31 ENCOUNTER — Inpatient Hospital Stay (HOSPITAL_COMMUNITY): Payer: Medicare Other

## 2020-03-31 DIAGNOSIS — I5032 Chronic diastolic (congestive) heart failure: Secondary | ICD-10-CM | POA: Diagnosis not present

## 2020-03-31 DIAGNOSIS — E785 Hyperlipidemia, unspecified: Secondary | ICD-10-CM | POA: Diagnosis not present

## 2020-03-31 DIAGNOSIS — U071 COVID-19: Secondary | ICD-10-CM | POA: Diagnosis not present

## 2020-03-31 DIAGNOSIS — N185 Chronic kidney disease, stage 5: Secondary | ICD-10-CM | POA: Diagnosis not present

## 2020-03-31 LAB — CBC WITH DIFFERENTIAL/PLATELET
Abs Immature Granulocytes: 0.08 10*3/uL — ABNORMAL HIGH (ref 0.00–0.07)
Basophils Absolute: 0 10*3/uL (ref 0.0–0.1)
Basophils Relative: 0 %
Eosinophils Absolute: 0 10*3/uL (ref 0.0–0.5)
Eosinophils Relative: 0 %
HCT: 29.1 % — ABNORMAL LOW (ref 36.0–46.0)
Hemoglobin: 8.6 g/dL — ABNORMAL LOW (ref 12.0–15.0)
Immature Granulocytes: 1 %
Lymphocytes Relative: 9 %
Lymphs Abs: 0.7 10*3/uL (ref 0.7–4.0)
MCH: 28.7 pg (ref 26.0–34.0)
MCHC: 29.6 g/dL — ABNORMAL LOW (ref 30.0–36.0)
MCV: 97 fL (ref 80.0–100.0)
Monocytes Absolute: 0.2 10*3/uL (ref 0.1–1.0)
Monocytes Relative: 3 %
Neutro Abs: 6.8 10*3/uL (ref 1.7–7.7)
Neutrophils Relative %: 87 %
Platelets: 219 10*3/uL (ref 150–400)
RBC: 3 MIL/uL — ABNORMAL LOW (ref 3.87–5.11)
RDW: 15.5 % (ref 11.5–15.5)
WBC: 7.9 10*3/uL (ref 4.0–10.5)
nRBC: 0 % (ref 0.0–0.2)

## 2020-03-31 LAB — URINE CULTURE: Culture: NO GROWTH

## 2020-03-31 LAB — COMPREHENSIVE METABOLIC PANEL
ALT: 17 U/L (ref 0–44)
AST: 32 U/L (ref 15–41)
Albumin: 2.5 g/dL — ABNORMAL LOW (ref 3.5–5.0)
Alkaline Phosphatase: 112 U/L (ref 38–126)
Anion gap: 14 (ref 5–15)
BUN: 46 mg/dL — ABNORMAL HIGH (ref 8–23)
CO2: 14 mmol/L — ABNORMAL LOW (ref 22–32)
Calcium: 8 mg/dL — ABNORMAL LOW (ref 8.9–10.3)
Chloride: 114 mmol/L — ABNORMAL HIGH (ref 98–111)
Creatinine, Ser: 4.64 mg/dL — ABNORMAL HIGH (ref 0.44–1.00)
GFR, Estimated: 9 mL/min — ABNORMAL LOW (ref >60)
Glucose, Bld: 122 mg/dL — ABNORMAL HIGH (ref 70–99)
Potassium: 3.6 mmol/L (ref 3.5–5.1)
Sodium: 142 mmol/L (ref 135–145)
Total Bilirubin: 0.8 mg/dL (ref 0.3–1.2)
Total Protein: 7.1 g/dL (ref 6.5–8.1)

## 2020-03-31 LAB — LACTATE DEHYDROGENASE: LDH: 256 U/L — ABNORMAL HIGH (ref 98–192)

## 2020-03-31 LAB — PHOSPHORUS: Phosphorus: 4.4 mg/dL (ref 2.5–4.6)

## 2020-03-31 LAB — D-DIMER, QUANTITATIVE: D-Dimer, Quant: 2.67 ug/mL-FEU — ABNORMAL HIGH (ref 0.00–0.50)

## 2020-03-31 LAB — PROCALCITONIN: Procalcitonin: 0.17 ng/mL

## 2020-03-31 LAB — C-REACTIVE PROTEIN: CRP: 3 mg/dL — ABNORMAL HIGH (ref <1.0)

## 2020-03-31 LAB — FIBRINOGEN: Fibrinogen: 261 mg/dL (ref 210–475)

## 2020-03-31 LAB — MAGNESIUM: Magnesium: 1.6 mg/dL — ABNORMAL LOW (ref 1.7–2.4)

## 2020-03-31 LAB — FERRITIN: Ferritin: 72 ng/mL (ref 11–307)

## 2020-03-31 IMAGING — DX DG CHEST 1V PORT SAME DAY
1 series · 1 of 1 positions shown · non-contrast
Comparison: Portable chest [KZ] hours today. Chest CT [DATE]
and earlier.

CLINICAL DATA: 74-year-old female with shortness of breath.
[KZ].

EXAM:
PORTABLE CHEST 1 VIEW

[chest ap]
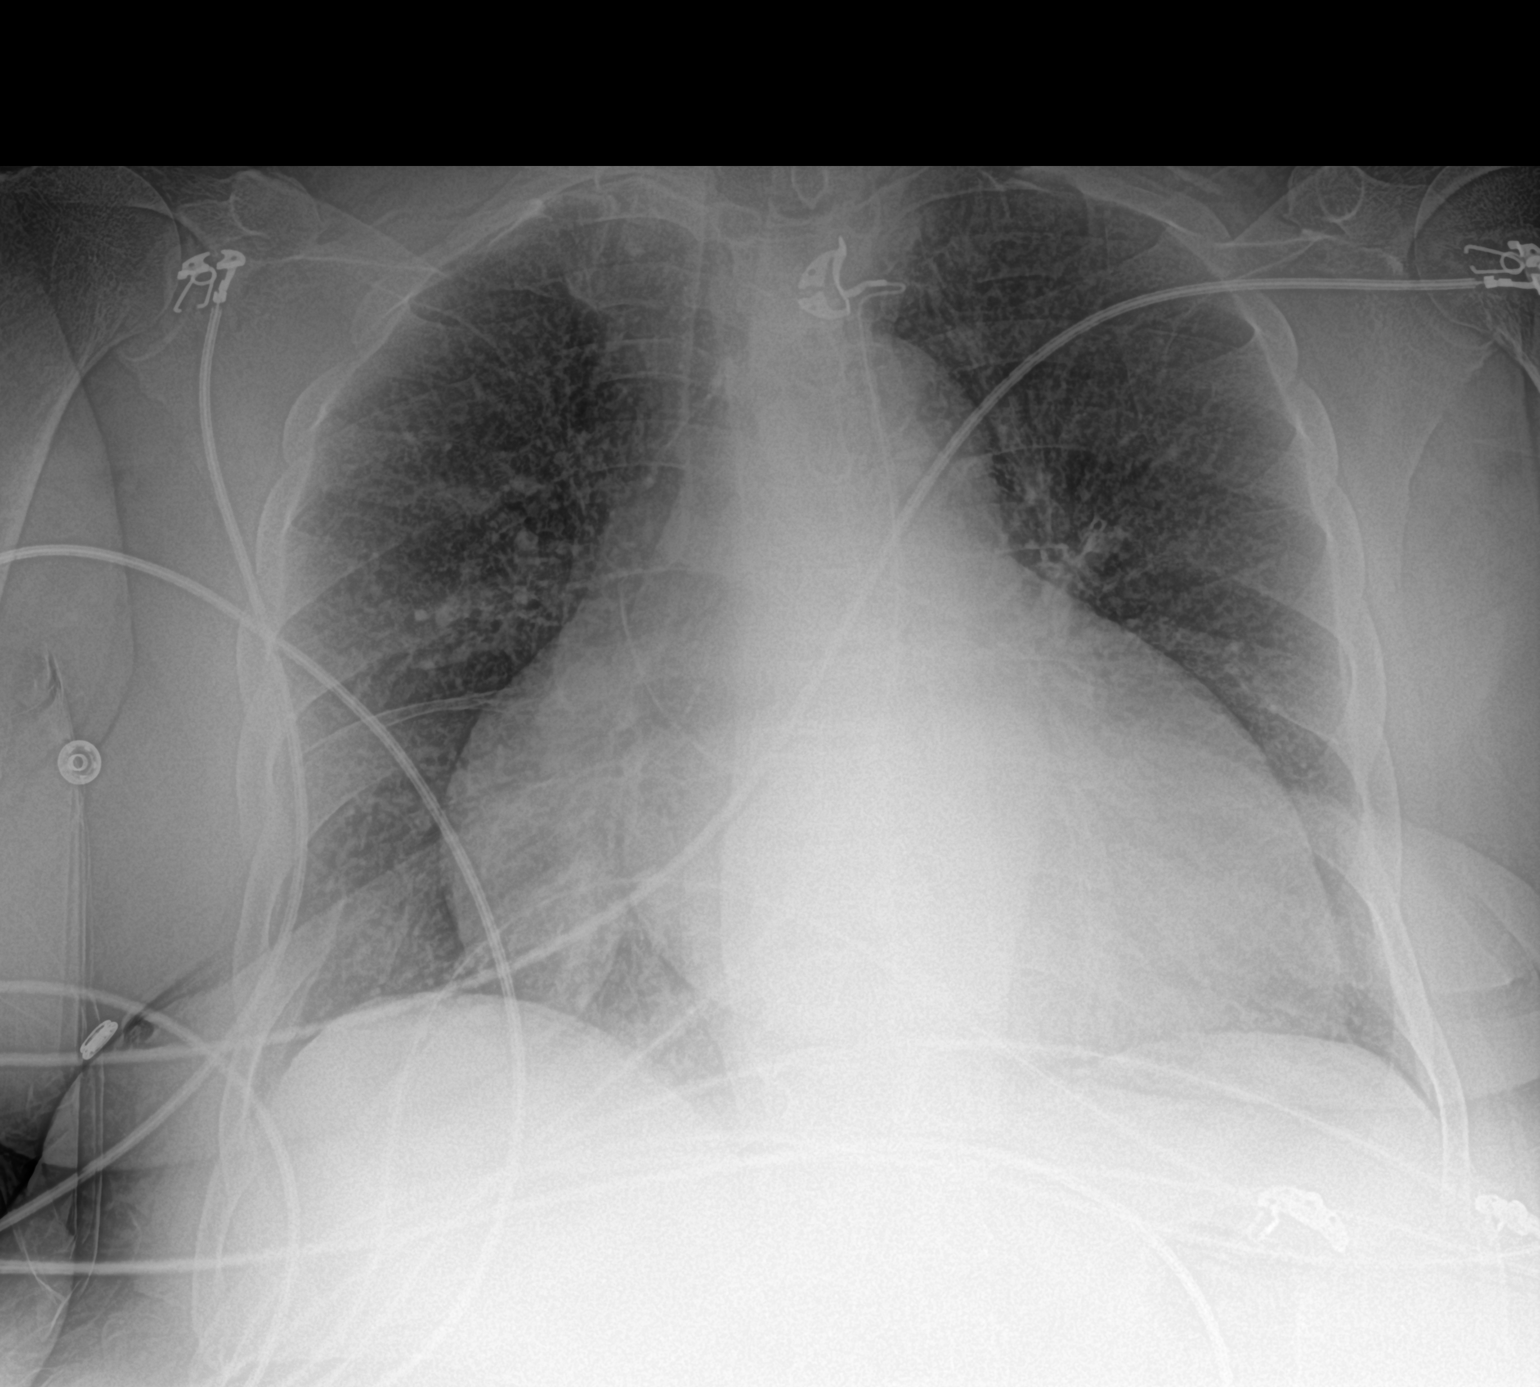

[1 of 1 positions shown; findings below may reference images not displayed]

FINDINGS: Portable AP semi upright view at [KZ] hours. Stable cardiomegaly and
mediastinal contours. Visualized tracheal air column is within
normal limits. Improved lung volumes from earlier today. Diffuse
reticulonodular increased pulmonary opacity. No pneumothorax,
pleural effusion or confluent opacity. No acute osseous abnormality
identified.
IMPRESSION: Mild diffuse reticulonodular opacity suspicious for [KZ]
pneumonia in this setting. Chronic cardiomegaly. No pleural
effusion.

## 2020-03-31 MED ORDER — MAGNESIUM SULFATE 2 GM/50ML IV SOLN
2.0000 g | Freq: Once | INTRAVENOUS | Status: AC
  Administered 2020-03-31: 2 g via INTRAVENOUS
  Filled 2020-03-31: qty 50

## 2020-03-31 MED ORDER — SODIUM BICARBONATE 650 MG PO TABS
1300.0000 mg | ORAL_TABLET | Freq: Two times a day (BID) | ORAL | Status: DC
  Administered 2020-03-31 – 2020-04-04 (×8): 1300 mg via ORAL
  Filled 2020-03-31 (×10): qty 2

## 2020-03-31 MED ORDER — PANTOPRAZOLE SODIUM 40 MG PO TBEC
40.0000 mg | DELAYED_RELEASE_TABLET | Freq: Two times a day (BID) | ORAL | Status: DC
  Administered 2020-03-31 – 2020-04-04 (×8): 40 mg via ORAL
  Filled 2020-03-31 (×7): qty 1

## 2020-03-31 MED ORDER — LOPERAMIDE HCL 2 MG PO CAPS: 2.0000 mg | ORAL_CAPSULE | ORAL | Status: DC | PRN

## 2020-03-31 NOTE — ED Notes (Signed)
Updated pt's daughter Glenard Haring per request

## 2020-03-31 NOTE — ED Notes (Signed)
Assisted pt with meal tray. Pt at approx 40% of meal.

## 2020-03-31 NOTE — ED Notes (Signed)
Attempted to call report

## 2020-03-31 NOTE — ED Notes (Signed)
Pt resting quietly in bed, no needs voiced at this time. Will continue to monitor

## 2020-03-31 NOTE — Progress Notes (Addendum)
PROGRESS NOTE                                                                                                                                                                                                             Patient Demographics:    Stephanie Griffith, is a 74 y.o. female, DOB - 15-Aug-1945, NOM:[id number]  Outpatient Primary MD for the patient is Stephanie Carol, MD   Admit date - 03/30/2020   LOS - 1  Chief Complaint  Patient presents with  . Code Sepsis       Brief Narrative: Patient is a 74 y.o. female with PMHx of CKD stage V, PAD-s/p left AKA, PAF, chronic diastolic heart failure-presented to the hospital with cough, rhinorrhea-and confusion. She was found to have acute metabolic encephalopathy in the setting of COVID-19 pneumonia. See below for further details  COVID-19 vaccinated status:   Significant Events: 12/29>> Admit to [facility] for COVID-19 pneumonia/metabolic encephalopathy  Significant studies: 12/29>> CT head: No evidence of acute intracranial abnormality. 12/29>>Chest x-ray: No active disease 12/30>> chest x-ray: Diffuse reticulonodular opacity-suspicious for COVID-19 pneumonia  COVID-19 medications: Steroids: 12/29>> Remdesivir: 12/29>>  Antibiotics: None  Microbiology data: 12/29 >>blood culture: No growth  Procedures: None  Consults: None  DVT prophylaxis: heparin injection 5,000 Units Start: 03/30/20 2200    Subjective:    Stephanie Griffith today was sleeping when I first entered the room-but she woke up with a loud verbal stimuli-was able to tell me her name, her daughter's name-and was able to answer most of my questions appropriately. She was slightly slow with her responses. She denies any worsening of her shortness of breath.   Assessment  & Plan :   Acute metabolic encephalopathy due to COVID-19 infection: Improved-following commands this morning-answer most of my questions  appropriately. Continue supportive care   Covid 19 Viral pneumonia: On room air this morning-with O2 saturations in the low 90s-continue steroid/Remdesivir. If worsens-we can contemplate administering Actemra.  Fever: afebrile O2 requirements:  SpO2: 95 %   COVID-19 Labs: Recent Labs    03/31/20 0631 03/31/20 0636  DDIMER 2.67*  --   FERRITIN 72  --   LDH  --  256*  CRP 3.0*  --        Component Value Date/Time   BNP 1,490.5 (H) 03/02/2020 0850    Recent Labs  Lab 03/31/20 0636  PROCALCITON 0.17    Lab Results  Component Value Date   SARSCOV2NAA NEGATIVE 03/02/2020   SARSCOV2NAA NEGATIVE 02/12/2019   [place] NEGATIVE 02/06/2019     Prone/Incentive Spirometry: encouraged  incentive spirometry use 3-4/hour.\\  Stage V CKD: Creatinine close to baseline-Per prior documentation-she does not desire HD.  [place] metabolic acidosis: Probably secondary to CKD-increase oral bicarbonate to 1300 mg twice daily.  Hypomagnesemia: Replete and recheck  Normocytic anemia: Likely due to CKD-hemoglobin stable and close to baseline-follow  HTN: BP controlled-continue Norvasc/metoprolol.  HLD: Continue Lipitor  Chronic diastolic heart failure: Compensated-resume Lasix over the next few days.  History of moderate to severe pulmonary hypertension: Continue outpatient follow-up with cardiology  History of PAF: Reviewed prior cardiology consultation note November 2020-remote history of isolated episode A. fib-given issues with anemia-was never started on anticoagulation. On aspirin.  PAD-s/p left AKA   Obesity: Estimated body mass index is 30.1 kg/m as calculated from the following:   Height as of this encounter: 5\' 8"  (1.727 m).   Weight as of this encounter: 89.8 kg.   ABG:    Component Value Date/Time   PHART 7.254 (L) 02/06/2019 0312   PCO2ART 28.3 (L) 02/06/2019 0312   PO2ART 95.0 02/06/2019 0312   HCO3 23.8 02/13/2019 0811   TCO2 16 (L) 04/07/2019 1103    ACIDBASEDEF 1.0 02/13/2019 0804   ACIDBASEDEF 1.0 02/13/2019 0804   O2SAT 70.0 02/13/2019 0811    Vent Settings: N/A   Condition - Stable  Family Communication  :  Daughter Stephanie Griffith-[phone] left a voicemail on 12/30  Code Status :  Full Code  Diet :  Diet Order            Diet Heart Room service appropriate? Yes; Fluid consistency: Thin  Diet effective now                  Disposition Plan  :   Status is: Inpatient  Remains inpatient appropriate because:Inpatient level of care appropriate due to severity of illness   Dispo: The patient is from: Home              Anticipated d/c is to: Home              Anticipated d/c date is: 3 days              Patient currently is not medically stable to d/c.  Barriers to discharge: Hypoxia requiring O2 supplementation/complete 5 days of IV Remdesivir  Antimicorbials  :    Anti-infectives (From admission, onward)   Start     Dose/Rate Route Frequency Ordered Stop   03/31/20 1430  ceFEPIme (MAXIPIME) 2 g in sodium chloride 0.9 % 100 mL IVPB  Status:  Discontinued        2 g 200 mL/hr over 30 Minutes Intravenous Every 24 hours 03/30/20 1640 03/30/20 1800   03/31/20 1000  remdesivir 100 mg in sodium chloride 0.9 % 100 mL IVPB       "Followed by" Linked Group Details   100 mg 200 mL/hr over 30 Minutes Intravenous Daily 03/30/20 1800 04/04/20 0959   03/30/20 1800  remdesivir 200 mg in sodium chloride 0.9% 250 mL IVPB       "Followed by" Linked Group Details   200 mg 580 mL/hr over 30 Minutes Intravenous Once 03/30/20 1800 03/30/20 2040   03/30/20 1515  vancomycin (VANCOREADY) IVPB 1750 mg/350 mL        1,750 mg 175 mL/hr over  120 Minutes Intravenous  Once 03/30/20 1510 03/30/20 1815   03/30/20 1415  ceFEPIme (MAXIPIME) 2 g in sodium chloride 0.9 % 100 mL IVPB        2 g 200 mL/hr over 30 Minutes Intravenous  Once 03/30/20 1412 03/30/20 1613   03/30/20 1415  metroNIDAZOLE (FLAGYL) IVPB 500 mg        500 mg 100 mL/hr over 60  Minutes Intravenous  Once 03/30/20 1412 03/30/20 1613   03/30/20 1415  vancomycin (VANCOCIN) IVPB 1000 mg/200 mL premix  Status:  Discontinued        1,000 mg 200 mL/hr over 60 Minutes Intravenous  Once 03/30/20 1412 03/30/20 1417      Inpatient Medications  Scheduled Meds: . amLODipine  10 mg Oral Daily  . vitamin C  500 mg Oral Daily  . aspirin EC  81 mg Oral Daily  . atorvastatin  40 mg Oral q1800  . calcitRIOL  0.5 mcg Oral Daily  . docusate sodium  100 mg Oral BID  . gabapentin  100 mg Oral Daily  . heparin injection (subcutaneous)  5,000 Units Subcutaneous Q8H  . methylPREDNISolone (SOLU-MEDROL) injection  0.5 mg/kg Intravenous Q12H   Followed by  . [START ON 04/02/2020] predniSONE  50 mg Oral Daily  . metoprolol tartrate  50 mg Oral BID  . sodium bicarbonate  650 mg Oral TID  . sodium chloride flush  3 mL Intravenous Q12H  . sodium chloride flush  3 mL Intravenous Q12H  . zinc sulfate  220 mg Oral Daily   Continuous Infusions: . sodium chloride    . remdesivir 100 mg in NS 100 mL Stopped (03/31/20 1013)   PRN Meds:.sodium chloride, acetaminophen, albuterol, bisacodyl, chlorpheniramine-HYDROcodone, guaiFENesin-dextromethorphan, haloperidol lactate, hydrALAZINE, ondansetron **OR** ondansetron (ZOFRAN) IV, oxyCODONE, polyethylene glycol, sodium chloride flush, sodium phosphate   Time Spent in minutes  25   See all Orders from today for further details   Oren Binet M.D on 03/31/2020 at 1:28 PM  To page go to [email or web address] - use universal password  [facility] -  Office  [phone]    Objective:   Vitals:   03/31/20 1000 03/31/20 1007 03/31/20 1009 03/31/20 1200  BP: (!) 181/81 (!) 181/81  (!) 153/76  Pulse: 76  82 72  Resp:    (!) 22  Temp:    97.7 F (36.5 C)  TempSrc:    Oral  SpO2: 98%   95%  Weight:      Height:        Wt Readings from Last 3 Encounters:  03/30/20 89.8 kg  03/05/20 89.1 kg  02/18/19 70.2 kg    No intake or output  data in the 24 hours ending 03/31/20 1328   Physical Exam Gen Exam: Slow to respond but awake/alert answering all my questions appropriately. Not in any distress. HEENT:atraumatic, normocephalic Chest: B/L clear to auscultation anteriorly CVS:S1S2 regular Abdomen:soft non tender, non distended Extremities: Right lower extremity-significant lichenification/chronic venous stasis changes. Left AKA. Neurology: Non focal Skin: no rash   Data Review:    CBC Recent Labs  Lab 03/30/20 1412 03/31/20 0631  WBC 10.5 7.9  HGB 9.3* 8.6*  HCT 31.3* 29.1*  PLT 257 219  MCV 95.1 97.0  MCH 28.3 28.7  MCHC 29.7* 29.6*  RDW 15.5 15.5  LYMPHSABS 0.9 0.7  MONOABS 1.3* 0.2  EOSABS 0.3 0.0  BASOSABS 0.1 0.0    Chemistries  Recent Labs  Lab 03/30/20 1412 03/31/20 0631  NA 142 142  K 4.0 3.6  CL 110 114*  CO2 20* 14*  GLUCOSE 93 122*  BUN 45* 46*  CREATININE 4.73* 4.64*  CALCIUM 8.9 8.0*  MG  --  1.6*  AST 28 32  ALT 15 17  ALKPHOS 135* 112  BILITOT 0.7 0.8   ------------------------------------------------------------------------------------------------------------------ No results for input(s): CHOL, HDL, LDLCALC, TRIG, CHOLHDL, LDLDIRECT in the last 72 hours.  Lab Results  Component Value Date   HGBA1C 5.3 12/29/2015   ------------------------------------------------------------------------------------------------------------------ No results for input(s): TSH, T4TOTAL, T3FREE, THYROIDAB in the last 72 hours.  Invalid input(s): FREET3 ------------------------------------------------------------------------------------------------------------------ Recent Labs    03/31/20 0631  FERRITIN 72    Coagulation profile Recent Labs  Lab 03/30/20 1412  INR 1.2    Recent Labs    03/31/20 0631  DDIMER 2.67*    Cardiac Enzymes No results for input(s): CKMB, TROPONINI, MYOGLOBIN in the last 168 hours.  Invalid input(s):  CK ------------------------------------------------------------------------------------------------------------------    Component Value Date/Time   BNP 1,490.5 (H) 03/02/2020 0850    Micro Results Recent Results (from the past 240 hour(s))  Blood Culture (routine x 2)     Status: None (Preliminary result)   Collection Time: 03/30/20  2:00 PM   Specimen: BLOOD  Result Value Ref Range Status   Specimen Description BLOOD LEFT ANTECUBITAL  Final   Special Requests   Final    BOTTLES DRAWN AEROBIC AND ANAEROBIC Blood Culture adequate volume   Culture   Final    NO GROWTH < 12 HOURS Performed at [facility], [address], [place], [place] [address]    Report Status PENDING  Incomplete  Blood Culture (routine x 2)     Status: None (Preliminary result)   Collection Time: 03/30/20  2:00 PM   Specimen: BLOOD  Result Value Ref Range Status   Specimen Description BLOOD RIGHT ANTECUBITAL  Final   Special Requests   Final    BOTTLES DRAWN AEROBIC AND ANAEROBIC Blood Culture adequate volume   Culture   Final    NO GROWTH < 12 HOURS Performed at [facility], [place] [address], [place], [place] [address]    Report Status PENDING  Incomplete    Radiology Reports CT Head Wo Contrast  Result Date: 03/30/2020 CLINICAL DATA:  Mental status change. EXAM: CT HEAD WITHOUT CONTRAST TECHNIQUE: Contiguous axial images were obtained from the base of the skull through the vertex without intravenous contrast. COMPARISON:  CT and MRI March 02, 2020. FINDINGS: Brain: No evidence of acute large vascular territory infarction, hemorrhage, hydrocephalus, extra-axial collection or mass lesion/mass effect. Similar patchy white matter hypoattenuation, most likely related to chronic microvascular ischemic disease. Similar remote infarcts in the cerebellum, largest on the right. Vascular: Calcific atherosclerosis. No hyperdense vessel identified. Skull: No acute fracture. Sinuses/Orbits: Near complete  opacification left maxillary sinus with internal hyperdensity and maxillary sinus wall thickening suggesting chronicity. Unremarkable orbits. Other: No mastoid effusions. IMPRESSION: No evidence of acute intracranial abnormality. 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 Left maxillary sinus disease. Internal hyperdensity may represent inspissated secretions and/or fungal colonization. Electronically Signed   By: Margaretha Sheffield MD   On: 03/30/2020 15:11   CT Head Wo Contrast  Result Date: 03/02/2020 CLINICAL DATA:  Mental status change, unknown cause. Additional history provided: Slurred speech for 1 week. EXAM: CT HEAD WITHOUT CONTRAST TECHNIQUE: Contiguous axial images were obtained from the base of the skull through the vertex without intravenous contrast. COMPARISON:  Brain MRI 04/07/2019.  FINDINGS: Brain: Mild cerebral and cerebellar atrophy. Advanced ill-defined hypoattenuation within the cerebral white matter is nonspecific, but compatible with chronic small vessel ischemic disease. Redemonstrated chronic infarct within the inferior right cerebellar hemisphere. Additional tiny chronic infarcts within the bilateral cerebellar hemispheres were better appreciated on the prior MRI of 04/07/2019. There is no acute intracranial hemorrhage. No demarcated cortical infarct. No extra-axial fluid collection. No evidence of intracranial mass. No midline shift. Partially empty sella turcica. Vascular: No hyperdense vessel.  Atherosclerotic calcifications. Skull: Normal. Negative for fracture or focal lesion. Sinuses/Orbits: Visualized orbits show no acute finding. As before, there is near complete opacification of the left maxillary sinus with associated chronic reactive osteitis. IMPRESSION: No evidence of acute intracranial abnormality. Mild atrophy of the brain and advanced cerebral white matter chronic small vessel ischemic disease. Redemonstrated chronic  infarct within the inferior right cerebellar hemisphere. Additional tiny chronic infarcts within the bilateral cerebellar hemispheres were better appreciated on the prior MRI of 04/07/2019. Severe chronic left maxillary sinusitis. Electronically Signed   By: Kellie Simmering DO   On: 03/02/2020 10:32   CT Chest Wo Contrast  Result Date: 03/02/2020 CLINICAL DATA:  Altered mental status and slurred speech. Current chest radiograph demonstrates interstitial opacities suspicious for edema or atypical infection. EXAM: CT CHEST WITHOUT CONTRAST TECHNIQUE: Multidetector CT imaging of the chest was performed following the standard protocol without IV contrast. COMPARISON:  Current and prior chest radiographs. FINDINGS: Cardiovascular: Mild cardiomegaly. Small pericardial effusion. Mild 2 vessel coronary artery calcifications. Aorta is normal in caliber. There are aortic atherosclerotic calcifications. Main pulmonary artery is dilated to 4.1 cm. Mediastinum/Nodes: Prominent neck base lymph nodes, largest on the left, 8 mm in short axis. There are prominent to mildly enlarged mediastinal lymph nodes, largest 2 are right paratracheal, 1.3 and 1.2 cm in short axes. No mediastinal or hilar masses. Esophagus is distended with fluid and nondependent air. No esophageal mass or wall thickening. Trachea is unremarkable. Lungs/Pleura: Mild linear and reticular opacities are noted at the bases consistent with atelectasis and/or scarring. There are few additional areas of peripheral reticular lung opacity consistent with scarring most evident in the anterior upper lobes. No evidence of pneumonia or pulmonary edema. No lung mass or suspicious nodule. Mild centrilobular and paraseptal emphysema. No pleural effusion or pneumothorax. Upper Abdomen: Multiple gallstones. There is hazy opacity surrounding the gallbladder. Wall is somewhat ill-defined. Musculoskeletal: No fracture or acute finding. No osteoblastic or osteolytic lesions. No  chest wall masses. IMPRESSION: 1. No evidence of pneumonia or pulmonary edema. 2. Cholelithiasis with hazy opacity surrounding the gallbladder concerning for inflammation. Consider acute cholecystitis if there are consistent clinical findings, which could be further assessed with right upper quadrant ultrasound. 3. Mild cardiomegaly and small pericardial effusion. 4. Enlarged main pulmonary artery suggesting pulmonary artery hypertension. 5. Mild emphysema.  Aortic atherosclerotic calcifications. Aortic Atherosclerosis (ICD10-I70.0) and Emphysema (ICD10-J43.9). Electronically Signed   By: Lajean Manes M.D.   On: 03/02/2020 13:45   MR Brain Wo Contrast (neuro protocol)  Result Date: 03/02/2020 CLINICAL DATA:  Mental status change EXAM: MRI HEAD WITHOUT CONTRAST TECHNIQUE: Multiplanar, multiecho pulse sequences of the brain and surrounding structures were obtained without intravenous contrast. COMPARISON:  04/07/2019 FINDINGS: Brain: There is no acute infarction or intracranial hemorrhage. Moderate size chronic right cerebellar infarct. Additional tiny bilateral chronic cerebellar infarcts. Patchy and confluent areas of T2 hyperintensity supratentorial white matter are nonspecific but probably reflect stable moderate to advanced chronic microvascular ischemic changes. There is no intracranial mass, mass effect,  or edema. There is no hydrocephalus or extra-axial fluid collection. Ventricles and sulci are stable in size and configuration. Vascular: Major vessel flow voids at the skull base are preserved. Skull and upper cervical spine: Normal marrow signal is preserved. Sinuses/Orbits: Chronic left maxillary sinusitis with persistent circumferential mucosal thickening. Minor mucosal thickening elsewhere. Orbits are unremarkable. Other: Incidental note is made of partial empty sella. Mastoid air cells are clear. IMPRESSION: No acute infarction, hemorrhage, or mass. Chronic cerebellar infarcts. Moderate to advanced  chronic microvascular ischemic changes. Electronically Signed   By: Macy Mis M.D.   On: 03/02/2020 18:19   US RENAL  Result Date: 03/02/2020 CLINICAL DATA:  Acute renal injury EXAM: RENAL / URINARY TRACT ULTRASOUND COMPLETE COMPARISON:  02/08/2019 FINDINGS: Right Kidney: Renal measurements: 6.6 x 4.3 x 4.4 cm. = volume: 60 mL. Diffuse increased echogenicity is noted. Cortical thinning is seen as well. No mass lesion or hydronephrosis is noted. Left Kidney: Renal measurements: 6.8 x 3.9 x 3.2 cm. = volume: 46 mL. Increased echogenicity is noted. No mass lesion or hydronephrosis is seen. Bladder: Appears normal for degree of bladder distention. Other: None. IMPRESSION: Small kidneys with increased echogenicity consistent with chronic medical renal disease. No obstructive changes are noted. Electronically Signed   By: Inez Catalina M.D.   On: 03/02/2020 22:08   DG Chest Port 1 View  Result Date: 03/30/2020 CLINICAL DATA:  Sepsis. EXAM: PORTABLE CHEST 1 VIEW COMPARISON:  March 02, 2020. FINDINGS: Stable cardiomegaly. No pneumothorax or pleural effusion is noted. Both lungs are clear. The visualized skeletal structures are unremarkable. IMPRESSION: No active disease. Electronically Signed   By: Marijo Conception M.D.   On: 03/30/2020 14:29   DG Chest Port 1 View  Result Date: 03/02/2020 CLINICAL DATA:  Altered mental status EXAM: PORTABLE CHEST 1 VIEW COMPARISON:  Chest radiograph February 05, 2019 FINDINGS: Cardiomegaly similar priors. Left greater than right interstitial opacities. No visible pneumothorax or pleural effusion. No acute osseous abnormality. IMPRESSION: Left greater than right interstitial opacities, which may reflect edema or atypical infection. Electronically Signed   By: Dahlia Bailiff MD   On: 03/02/2020 10:02   DG Chest Port 1V same Day  Result Date: 03/31/2020 CLINICAL DATA:  74 year old female with shortness of breath. COVID-19. EXAM: PORTABLE CHEST 1 VIEW COMPARISON:   Portable chest 0222 hours today. Chest CT 03/02/2020 and earlier. FINDINGS: Portable AP semi upright view at 0714 hours. Stable cardiomegaly and mediastinal contours. Visualized tracheal air column is within normal limits. Improved lung volumes from earlier today. Diffuse reticulonodular increased pulmonary opacity. No pneumothorax, pleural effusion or confluent opacity. No acute osseous abnormality identified. IMPRESSION: Mild diffuse reticulonodular opacity suspicious for COVID-19 pneumonia in this setting. Chronic cardiomegaly. No pleural effusion. Electronically Signed   By: Genevie Ann M.D.   On: 03/31/2020 07:41

## 2020-03-31 NOTE — ED Notes (Signed)
+  tele Breakfast order placed

## 2020-04-01 DIAGNOSIS — E785 Hyperlipidemia, unspecified: Secondary | ICD-10-CM | POA: Diagnosis not present

## 2020-04-01 DIAGNOSIS — N185 Chronic kidney disease, stage 5: Secondary | ICD-10-CM | POA: Diagnosis not present

## 2020-04-01 DIAGNOSIS — U071 COVID-19: Secondary | ICD-10-CM | POA: Diagnosis not present

## 2020-04-01 DIAGNOSIS — I5032 Chronic diastolic (congestive) heart failure: Secondary | ICD-10-CM | POA: Diagnosis not present

## 2020-04-01 LAB — COMPREHENSIVE METABOLIC PANEL
ALT: 19 U/L (ref 0–44)
AST: 40 U/L (ref 15–41)
Albumin: 2.6 g/dL — ABNORMAL LOW (ref 3.5–5.0)
Alkaline Phosphatase: 117 U/L (ref 38–126)
Anion gap: 14 (ref 5–15)
BUN: 51 mg/dL — ABNORMAL HIGH (ref 8–23)
CO2: 16 mmol/L — ABNORMAL LOW (ref 22–32)
Calcium: 8.4 mg/dL — ABNORMAL LOW (ref 8.9–10.3)
Chloride: 114 mmol/L — ABNORMAL HIGH (ref 98–111)
Creatinine, Ser: 4.56 mg/dL — ABNORMAL HIGH (ref 0.44–1.00)
GFR, Estimated: 10 mL/min — ABNORMAL LOW (ref >60)
Glucose, Bld: 123 mg/dL — ABNORMAL HIGH (ref 70–99)
Potassium: 3.5 mmol/L (ref 3.5–5.1)
Sodium: 144 mmol/L (ref 135–145)
Total Bilirubin: 0.6 mg/dL (ref 0.3–1.2)
Total Protein: 7.2 g/dL (ref 6.5–8.1)

## 2020-04-01 LAB — CBC WITH DIFFERENTIAL/PLATELET
Abs Immature Granulocytes: 0.16 10*3/uL — ABNORMAL HIGH (ref 0.00–0.07)
Basophils Absolute: 0 10*3/uL (ref 0.0–0.1)
Basophils Relative: 0 %
Eosinophils Absolute: 0 10*3/uL (ref 0.0–0.5)
Eosinophils Relative: 0 %
HCT: 27.3 % — ABNORMAL LOW (ref 36.0–46.0)
Hemoglobin: 8.3 g/dL — ABNORMAL LOW (ref 12.0–15.0)
Immature Granulocytes: 1 %
Lymphocytes Relative: 7 %
Lymphs Abs: 0.8 10*3/uL (ref 0.7–4.0)
MCH: 28.2 pg (ref 26.0–34.0)
MCHC: 30.4 g/dL (ref 30.0–36.0)
MCV: 92.9 fL (ref 80.0–100.0)
Monocytes Absolute: 1 10*3/uL (ref 0.1–1.0)
Monocytes Relative: 8 %
Neutro Abs: 9.7 10*3/uL — ABNORMAL HIGH (ref 1.7–7.7)
Neutrophils Relative %: 84 %
Platelets: 174 10*3/uL (ref 150–400)
RBC: 2.94 MIL/uL — ABNORMAL LOW (ref 3.87–5.11)
RDW: 15.4 % (ref 11.5–15.5)
WBC: 11.7 10*3/uL — ABNORMAL HIGH (ref 4.0–10.5)
nRBC: 0 % (ref 0.0–0.2)

## 2020-04-01 LAB — D-DIMER, QUANTITATIVE: D-Dimer, Quant: 2.02 ug/mL-FEU — ABNORMAL HIGH (ref 0.00–0.50)

## 2020-04-01 LAB — C-REACTIVE PROTEIN: CRP: 2.8 mg/dL — ABNORMAL HIGH (ref <1.0)

## 2020-04-01 LAB — PHOSPHORUS: Phosphorus: 4.9 mg/dL — ABNORMAL HIGH (ref 2.5–4.6)

## 2020-04-01 LAB — MAGNESIUM: Magnesium: 2.2 mg/dL (ref 1.7–2.4)

## 2020-04-01 LAB — FERRITIN: Ferritin: 141 ng/mL (ref 11–307)

## 2020-04-01 MED ORDER — FUROSEMIDE 40 MG PO TABS
40.0000 mg | ORAL_TABLET | Freq: Every day | ORAL | Status: DC
Start: 2020-04-01 — End: 2020-04-04
  Administered 2020-04-01 – 2020-04-03 (×3): 40 mg via ORAL
  Filled 2020-04-01 (×3): qty 1

## 2020-04-01 MED ORDER — HALOPERIDOL LACTATE 5 MG/ML IJ SOLN
5.0000 mg | Freq: Once | INTRAMUSCULAR | Status: AC
  Administered 2020-04-01: 5 mg via INTRAMUSCULAR

## 2020-04-01 MED ORDER — QUETIAPINE FUMARATE 25 MG PO TABS
25.0000 mg | ORAL_TABLET | Freq: Every day | ORAL | Status: DC
  Administered 2020-04-01 – 2020-04-03 (×3): 25 mg via ORAL
  Filled 2020-04-01 (×3): qty 1
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
## 2020-04-01 NOTE — Evaluation (Signed)
Physical Therapy Evaluation Patient Details Name: Stephanie Griffith MRN: [id number] DOB: [birth year] Today's Date: 04/01/2020   History of Present Illness  74 y.o. female admitted on 03/30/20 forcough, rhinorrhea and confusion.  Dx with acute metabolic encephalopathy in the setting of COVID 19 PNA.  Pt with significant PMH of pulmonary HTN, paroxysmal atrial tachycardia, PAF, PAD, COPD, chronic renal insufficency stage IV, chronic diastolic heart failure, anemia of chronic disease, L AKA (2013).  Clinical Impression  Pt is two person mod to max assist to progress OOB to chair scooting backwards into the recliner chair.  It sounds like, at baseline (if she is reporting accurately), daughter assists her with lateral scoot into the [facility] and she operates from [facility] level (likes to go outside).  PT attempted to get daguthers on the phone to discuss d/c plan.  Monique answered, but was still at work and could not talk, advised me to call her sister, Glenard Haring. Angel did not pick up.  No message left.  In current level of care and not knowing how much assist family can provide at this time I am recommending SNF level rehab prior to returning home.   PT to follow acutely for deficits listed below.     Follow Up Recommendations SNF    Equipment Recommendations  Wheelchair (measurements PT);Wheelchair cushion (measurements PT);Hospital bed;Other (comment) (hoyer lift)    Recommendations for Other Services       Precautions / Restrictions Precautions Precautions: Fall Precaution Comments: L AKA, prosthesis no longer fits has not worn for a long time.      Mobility  Bed Mobility Overal bed mobility: Needs Assistance Bed Mobility: Supine to Sit     Supine to sit: +2 for physical assistance;Mod assist;HOB elevated     General bed mobility comments: HOB elevated to semi long sitting two person mod assist.    Transfers Overall transfer level: Needs assistance Equipment used: None Transfers:  Comptroller transfers: +2 physical assistance;Max assist;From elevated surface   General transfer comment: Two person max assist to scoot posteriorly from elevated bed down into recliner chair.  Pt attempting to help at times, but only very little, mostly hangs on to arms and wants therapists to lift her.  She reports her daughter normally helps with lateral transfers to [facility].  Ambulation/Gait             General Gait Details: Unable-WC bound  Financial trader Rankin (Stroke Patients Only)       Balance Overall balance assessment: Needs assistance Sitting-balance support: Bilateral upper extremity supported Sitting balance-Leahy Scale: Zero Sitting balance - Comments: up to max assist sitting in bed to maintain trunk, posterior preference. Postural control: Posterior lean                                   Pertinent Vitals/Pain      Home Living Family/patient expects to be discharged to:: Private residence Living Arrangements: Children Available Help at Discharge: Available PRN/intermittently Type of Home: Apartment Home Access: Stairs to enter     Home Layout: One level Home Equipment: Wheelchair - manual;Bedside commode Additional Comments: Pt not the most reliable historian at this time; above info is reported by pt this admission. Per admission 1 year ago, pt lives in garage apt at daughter's house?  Prior Function Level of Independence: Needs assistance         Comments: Daughter assists with ADLs, daughter does IADLs. Uses w/c for mobility.  Doesnt wear her prosthesis anymore, ~1 year.     Hand Dominance   Dominant Hand: Left    Extremity/Trunk Assessment   Upper Extremity Assessment Upper Extremity Assessment: Defer to OT evaluation    Lower Extremity Assessment Lower Extremity Assessment: RLE deficits/detail;LLE deficits/detail RLE Deficits /  Details: right leg is weak, dry, dark lower leg, unable to lift against gravity LLE Deficits / Details: h/o L AKA, moves, but not against gravity    Cervical / Trunk Assessment Cervical / Trunk Assessment: Normal  Communication   Communication: No difficulties  Cognition Arousal/Alertness: Awake/alert Behavior During Therapy: WFL for tasks assessed/performed Overall Cognitive Status: No family/caregiver present to determine baseline cognitive functioning                                 General Comments: Has some STM deficits and is a poor historian.  Some info matches last admission, and seems less combative than reported in notes.  She is adamate it is her way, not others.      General Comments General comments (skin integrity, edema, etc.): Pt with increased WOB during mobility, however, O2 sats were in the mid 90s when spot checked on RA.    Exercises Other Exercises Other Exercises: Reviewed IS and flutter valve, cues for technique, 750 mL max inspired volume.   Assessment/Plan    PT Assessment Patient needs continued PT services  PT Problem List Decreased strength;Decreased activity tolerance;Decreased balance;Decreased mobility;Decreased cognition;Decreased knowledge of use of DME;Decreased safety awareness;Cardiopulmonary status limiting activity;Obesity       PT Treatment Interventions DME instruction;Functional mobility training;Therapeutic activities;Balance training;Therapeutic exercise;Cognitive remediation;Patient/family education;Wheelchair mobility training    PT Goals (Current goals can be found in the Care Plan section)  Acute Rehab PT Goals Patient Stated Goal: to go home PT Goal Formulation: Patient unable to participate in goal setting Time For Goal Achievement: 04/15/20 Potential to Achieve Goals: Good    Frequency Min 3X/week   Barriers to discharge        Co-evaluation PT/OT/SLP Co-Evaluation/Treatment: Yes Reason for Co-Treatment:  Complexity of the patient's impairments (multi-system involvement);Necessary to address cognition/behavior during functional activity;For patient/therapist safety;To address functional/ADL transfers PT goals addressed during session: Mobility/safety with mobility;Balance;Strengthening/ROM         AM-PAC PT "6 Clicks" Mobility  Outcome Measure Help needed turning from your back to your side while in a flat bed without using bedrails?: A Lot Help needed moving from lying on your back to sitting on the side of a flat bed without using bedrails?: A Lot Help needed moving to and from a bed to a chair (including a wheelchair)?: A Lot Help needed standing up from a chair using your arms (e.g., wheelchair or bedside chair)?: Total Help needed to walk in hospital room?: Total Help needed climbing 3-5 steps with a railing? : Total 6 Click Score: 9    End of Session   Activity Tolerance: Patient limited by fatigue Patient left: in chair;with call bell/phone within reach Nurse Communication: Mobility status;Need for lift equipment PT Visit Diagnosis: Muscle weakness (generalized) (M62.81)    Time: 3235-5732 PT Time Calculation (min) (ACUTE ONLY): 32 min   Charges:   PT Evaluation $PT Eval Moderate Complexity: 1 Mod  Verdene Lennert, PT, DPT  Acute Rehabilitation [phone] pager [phone] office

## 2020-04-01 NOTE — Evaluation (Signed)
Occupational Therapy Evaluation Patient Details Name: Stephanie Griffith MRN: [id number] DOB: 17-Mar-1946 Today's Date: 04/01/2020    History of Present Illness 74 y.o. female admitted on 03/30/20 forcough, rhinorrhea and confusion.  Dx with acute metabolic encephalopathy in the setting of COVID 19 PNA.  Pt with significant PMH of pulmonary HTN, paroxysmal atrial tachycardia, PAF, PAD, COPD, chronic renal insufficency stage IV, chronic diastolic heart failure, anemia of chronic disease, L AKA (2013).   Clinical Impression   PTA pt living with family and requiring as needed assist for ADLs and mobility. Pt reports she uses a w/c for mobility and requires occasional help with ADLs from daughters. Pt is unreliable historian, so unsure of exact PLOF. At time of eval, pt required mod A +2 for bed mobility and max A +2 for AP transfer into recliner. Pt minimally helping, wanting therapists to lift her. Noted cognitive deficits in memory, safety, awareness, and problem solving. VSS on RA, although noted crackling sounds with pt breathing, as well as DOE 3/4. Given current status, recommend SNF at d/c unless daughter can provide 24/7 physical assist. Will continue to follow per POC listed below.    Follow Up Recommendations  SNF;Supervision/Assistance - 24 hour    Equipment Recommendations  3 in 1 bedside commode;Hospital bed    Recommendations for Other Services       Precautions / Restrictions Precautions Precautions: Fall Precaution Comments: L AKA, prosthesis no longer fits has not worn for a long time. Restrictions Weight Bearing Restrictions: No      Mobility Bed Mobility Overal bed mobility: Needs Assistance Bed Mobility: Supine to Sit     Supine to sit: Mod assist;+2 for physical assistance;+2 for safety/equipment     General bed mobility comments: HOB elevated to semi long sitting two person mod assist.    Transfers Overall transfer level: Needs assistance Equipment used:  None Transfers: Comptroller transfers: Max assist;+2 physical assistance;+2 safety/equipment   General transfer comment: Two person max assist to scoot posteriorly from elevated bed down into recliner chair.  Pt attempting to help at times, but only very little, mostly hangs on to arms and wants therapists to lift her.  She reports her daughter normally helps with lateral transfers to [facility].    Balance Overall balance assessment: Needs assistance Sitting-balance support: Bilateral upper extremity supported Sitting balance-Leahy Scale: Zero Sitting balance - Comments: up to max assist sitting in bed to maintain trunk, posterior preference. Postural control: Posterior lean                                 ADL either performed or assessed with clinical judgement   ADL Overall ADL's : Needs assistance/impaired Eating/Feeding: Set up;Sitting   Grooming: Set up;Sitting   Upper Body Bathing: Moderate assistance;Sitting   Lower Body Bathing: Maximal assistance;Sitting/lateral leans   Upper Body Dressing : Minimal assistance;Sitting   Lower Body Dressing: Maximal assistance;Sitting/lateral leans   Toilet Transfer: Maximal assistance;+2 for physical assistance;+2 for safety/equipment;Anterior/posterior Toilet Transfer Details (indicate cue type and reason): simulated with AP scoot to recliner. Toileting- Clothing Manipulation and Hygiene: Total assistance       Functional mobility during ADLs: Maximal assistance;+2 for physical assistance;+2 for safety/equipment (AP transfer) General ADL Comments: noted generalized weakness and decreased activity tolerance     Vision Patient Visual Report: No change from baseline       Perception  Praxis      Pertinent Vitals/Pain Pain Assessment: No/denies pain     Hand Dominance Left   Extremity/Trunk Assessment Upper Extremity Assessment Upper Extremity Assessment: Generalized  weakness   Lower Extremity Assessment Lower Extremity Assessment: Defer to PT evaluation RLE Deficits / Details: right leg is weak, dry, dark lower leg, unable to lift against gravity LLE Deficits / Details: h/o L AKA, moves, but not against gravity   Cervical / Trunk Assessment Cervical / Trunk Assessment: Normal   Communication Communication Communication: No difficulties   Cognition Arousal/Alertness: Awake/alert Behavior During Therapy: WFL for tasks assessed/performed Overall Cognitive Status: No family/caregiver present to determine baseline cognitive functioning Area of Impairment: Orientation;Attention;Memory;Safety/judgement;Problem solving                 Orientation Level: Disoriented to;Situation Current Attention Level: Sustained Memory: Decreased short-term memory   Safety/Judgement: Decreased awareness of safety;Decreased awareness of deficits   Problem Solving: Slow processing;Difficulty sequencing;Requires verbal cues General Comments: Overall pt is poor historian. At times is irriatble and requires increased time, cues, and explanation to process instructions.   General Comments  Pt with increased WOB during mobility, however, O2 sats were in the mid 90s when spot checked on RA.    Exercises Exercises: Other exercises Other Exercises Other Exercises: Reviewed IS and flutter valve, cues for technique, 750 mL max inspired volume.   Shoulder Instructions      Home Living Family/patient expects to be discharged to:: Private residence Living Arrangements: Children Available Help at Discharge: Available PRN/intermittently Type of Home: Apartment Home Access: Stairs to enter     Home Layout: One level     Bathroom Shower/Tub: Teacher, early years/pre: Handicapped height     Home Equipment: Wheelchair - manual;Bedside commode   Additional Comments: Pt not the most reliable historian at this time; above info is reported by pt this  admission. Per admission 1 year ago, pt lives in garage apt at daughter's house?      Prior Functioning/Environment Level of Independence: Needs assistance  Gait / Transfers Assistance Needed: Pt uses w/c for mobility and that daughter helps with transfers, has not used prosthetic in >1 year ADL's / Homemaking Assistance Needed: Needs occasional assist for ADLs; daughter manages IADLs   Comments: Daughter assists with ADLs, daughter does IADLs. Uses w/c for mobility.  Doesnt wear her prosthesis anymore, ~1 year.        OT Problem List: Decreased strength;Decreased knowledge of use of DME or AE;Decreased activity tolerance;Decreased cognition;Cardiopulmonary status limiting activity;Impaired balance (sitting and/or standing);Decreased safety awareness      OT Treatment/Interventions: Self-care/ADL training;Therapeutic exercise;Patient/family education;Balance training;Energy conservation;Therapeutic activities;DME and/or AE instruction;Cognitive remediation/compensation    OT Goals(Current goals can be found in the care plan section) Acute Rehab OT Goals Patient Stated Goal: to go home OT Goal Formulation: With patient Time For Goal Achievement: 04/15/20 Potential to Achieve Goals: Good  OT Frequency: Min 2X/week   Barriers to D/C:            Co-evaluation PT/OT/SLP Co-Evaluation/Treatment: Yes Reason for Co-Treatment: For patient/therapist safety;To address functional/ADL transfers PT goals addressed during session: Mobility/safety with mobility;Balance;Strengthening/ROM OT goals addressed during session: ADL's and self-care;Proper use of Adaptive equipment and DME;Strengthening/ROM      AM-PAC OT "6 Clicks" Daily Activity     Outcome Measure Help from another person eating meals?: A Little Help from another person taking care of personal grooming?: A Little Help from another person toileting, which includes using toliet, bedpan,  or urinal?: A Lot Help from another person  bathing (including washing, rinsing, drying)?: A Lot Help from another person to put on and taking off regular upper body clothing?: A Little Help from another person to put on and taking off regular lower body clothing?: A Lot 6 Click Score: 15   End of Session Nurse Communication: Mobility status;Need for lift equipment  Activity Tolerance: Patient tolerated treatment well Patient left: in chair;with call bell/phone within reach;with chair alarm set  OT Visit Diagnosis: Unsteadiness on feet (R26.81);Other abnormalities of gait and mobility (R26.89);Muscle weakness (generalized) (M62.81);Other symptoms and signs involving cognitive function                Time: 1430-1502 OT Time Calculation (min): 32 min Charges:  OT General Charges $OT Visit: 1 Visit OT Evaluation $OT Eval Moderate Complexity: [place], MSOT, OTR/L Acute Rehabilitation Services [facility] Office Number: [phone] Pager: [phone]  Zenovia Jarred 04/01/2020, 6:02 PM

## 2020-04-01 NOTE — Progress Notes (Signed)
PROGRESS NOTE                                                                                                                                                                                                             Patient Demographics:    Stephanie Griffith, is a 74 y.o. female, DOB - 12-Feb-1946, EYC:[id number]  Outpatient Primary MD for the patient is [name], MD   Admit date - 03/30/2020   LOS - 2  Chief Complaint  Patient presents with  . Code Sepsis       Brief Narrative: Patient is a 74 y.o. female with PMHx of CKD stage V, PAD-s/p left AKA, PAF, chronic diastolic heart failure-presented to the hospital with cough, rhinorrhea-and confusion. She was found to have acute metabolic encephalopathy in the setting of COVID-19 pneumonia. See below for further details  COVID-19 vaccinated status:   Significant Events: 12/29>> Admit to [facility] for COVID-19 pneumonia/metabolic encephalopathy  Significant studies: 12/29>> CT head: No evidence of acute intracranial abnormality. 12/29>>Chest x-ray: No active disease 12/30>> chest x-ray: Diffuse reticulonodular opacity-suspicious for COVID-19 pneumonia  COVID-19 medications: Steroids: 12/29>> Remdesivir: 12/29>>  Antibiotics: None  Microbiology data: 12/29 >>blood culture: No growth  Procedures: None  Consults: None  DVT prophylaxis: heparin injection 5,000 Units Start: 03/30/20 2200    Subjective:   Had delirium last night-relatively awake/alert this morning-want to go home.   Assessment  & Plan :   Acute metabolic encephalopathy due to COVID-19 infection: Overall improved-had mild delirium last night-awake/alert this morning.  Will add Seroquel nightly-suspect she may have some amount of vascular dementia at baseline-continue supportive care   Covid 19 Viral pneumonia: Improved-on room air-continue steroid/Remdesivir-inflammatory markers downtrending.    Fever: afebrile O2 requirements:  SpO2: 100 %   COVID-19 Labs: Recent Labs    03/31/20 0631 03/31/20 0636 04/01/20 0600  DDIMER 2.67*  --  2.02*  FERRITIN 72  --  141  LDH  --  256*  --   CRP 3.0*  --  2.8*       Component Value Date/Time   BNP 1,490.5 (H) 03/02/2020 0850    Recent Labs  Lab 03/31/20 0636  PROCALCITON 0.17    Lab Results  Component Value Date   SARSCOV2NAA NEGATIVE 03/02/2020   [place] NEGATIVE 02/12/2019   [place] NEGATIVE 02/06/2019  Prone/Incentive Spirometry: encouraged  incentive spirometry use 3-4/hour.\\  Stage V CKD: Creatinine close to baseline-Per prior documentation-she does not desire HD.  [place] metabolic acidosis: Probably secondary to CKD-continue oral bicarbonate 1300 mg twice daily.  Hypomagnesemia: Repleted  Normocytic anemia: Likely due to CKD-hemoglobin stable and close to baseline-follow  HTN: BP controlled-continue Norvasc/metoprolol.  HLD: Continue Lipitor  Chronic diastolic heart failure: Compensated-resume Lasix  History of moderate to severe pulmonary hypertension: Continue outpatient follow-up with cardiology  History of PAF: Reviewed prior cardiology consultation note November 2020-remote history of isolated episode A. fib-given issues with anemia-was never started on anticoagulation. On aspirin.  PAD-s/p left AKA   Obesity: Estimated body mass index is 30.1 kg/m as calculated from the following:   Height as of this encounter: 5\' 8"  (1.727 m).   Weight as of this encounter: 89.8 kg.   ABG:    Component Value Date/Time   PHART 7.254 (L) 02/06/2019 0312   PCO2ART 28.3 (L) 02/06/2019 0312   PO2ART 95.0 02/06/2019 0312   HCO3 23.8 02/13/2019 0811   TCO2 16 (L) 04/07/2019 1103   ACIDBASEDEF 1.0 02/13/2019 0804   ACIDBASEDEF 1.0 02/13/2019 0804   O2SAT 70.0 02/13/2019 0811    Vent Settings: N/A   Condition - Stable  Family Communication  :  Daughter Monique-[phone] left a  voicemail on 12/31  Code Status :  Full Code  Diet :  Diet Order            Diet Heart Room service appropriate? Yes; Fluid consistency: Thin  Diet effective now                  Disposition Plan  :   Status is: Inpatient  Remains inpatient appropriate because:Inpatient level of care appropriate due to severity of illness   Dispo: The patient is from: Home              Anticipated d/c is to: Home              Anticipated d/c date is: 1-2 days              Patient currently is not medically stable to d/c.  Barriers to discharge: Hypoxia requiring O2 supplementation/complete 5 days of IV Remdesivir  Antimicorbials  :    Anti-infectives (From admission, onward)   Start     Dose/Rate Route Frequency Ordered Stop   03/31/20 1430  ceFEPIme (MAXIPIME) 2 g in sodium chloride 0.9 % 100 mL IVPB  Status:  Discontinued        2 g 200 mL/hr over 30 Minutes Intravenous Every 24 hours 03/30/20 1640 03/30/20 1800   03/31/20 1000  remdesivir 100 mg in sodium chloride 0.9 % 100 mL IVPB       "Followed by" Linked Group Details   100 mg 200 mL/hr over 30 Minutes Intravenous Daily 03/30/20 1800 04/04/20 0959   03/30/20 1800  remdesivir 200 mg in sodium chloride 0.9% 250 mL IVPB       "Followed by" Linked Group Details   200 mg 580 mL/hr over 30 Minutes Intravenous Once 03/30/20 1800 03/30/20 2040   03/30/20 1515  vancomycin (VANCOREADY) IVPB 1750 mg/350 mL        1,750 mg 175 mL/hr over 120 Minutes Intravenous  Once 03/30/20 1510 03/30/20 1815   03/30/20 1415  ceFEPIme (MAXIPIME) 2 g in sodium chloride 0.9 % 100 mL IVPB        2 g 200 mL/hr over 30 Minutes Intravenous  Once 03/30/20 1412 03/30/20 1613   03/30/20 1415  metroNIDAZOLE (FLAGYL) IVPB 500 mg        500 mg 100 mL/hr over 60 Minutes Intravenous  Once 03/30/20 1412 03/30/20 1613   03/30/20 1415  vancomycin (VANCOCIN) IVPB 1000 mg/200 mL premix  Status:  Discontinued        1,000 mg 200 mL/hr over 60 Minutes Intravenous  Once  03/30/20 1412 03/30/20 1417      Inpatient Medications  Scheduled Meds: . amLODipine  10 mg Oral Daily  . vitamin C  500 mg Oral Daily  . aspirin EC  81 mg Oral Daily  . atorvastatin  40 mg Oral q1800  . calcitRIOL  0.5 mcg Oral Daily  . docusate sodium  100 mg Oral BID  . gabapentin  100 mg Oral Daily  . heparin injection (subcutaneous)  5,000 Units Subcutaneous Q8H  . methylPREDNISolone (SOLU-MEDROL) injection  0.5 mg/kg Intravenous Q12H   Followed by  . [START ON 04/02/2020] predniSONE  50 mg Oral Daily  . metoprolol tartrate  50 mg Oral BID  . pantoprazole  40 mg Oral BID AC  . sodium bicarbonate  1,300 mg Oral BID  . sodium chloride flush  3 mL Intravenous Q12H  . sodium chloride flush  3 mL Intravenous Q12H  . zinc sulfate  220 mg Oral Daily   Continuous Infusions: . sodium chloride    . remdesivir 100 mg in NS 100 mL 100 mg (04/01/20 1054)   PRN Meds:.sodium chloride, acetaminophen, albuterol, bisacodyl, chlorpheniramine-HYDROcodone, guaiFENesin-dextromethorphan, haloperidol lactate, hydrALAZINE, loperamide, ondansetron **OR** ondansetron (ZOFRAN) IV, oxyCODONE, polyethylene glycol, sodium chloride flush, sodium phosphate   Time Spent in minutes  25   See all Orders from today for further details   Oren Binet M.D on 04/01/2020 at 1:55 PM  To page go to [email or web address] - use universal password  [facility] -  Office  [phone]    Objective:   Vitals:   03/31/20 2113 04/01/20 0006 04/01/20 0403 04/01/20 0830  BP: (!) 159/74 133/66 (!) 143/82 (!) 147/71  Pulse: 70 61 71 66  Resp: 16 18 18 17   Temp: 97.8 F (36.6 C) (!) 97.2 F (36.2 C) (!) 97.1 F (36.2 C) (!) 97.4 F (36.3 C)  TempSrc: Oral Oral Axillary Oral  SpO2: 99%   100%  Weight:      Height:        Wt Readings from Last 3 Encounters:  03/30/20 89.8 kg  03/05/20 89.1 kg  02/18/19 70.2 kg    No intake or output data in the 24 hours ending 04/01/20 1355   Physical Exam Gen  Exam:Alert awake-not in any distress HEENT:atraumatic, normocephalic Chest: B/L clear to auscultation anteriorly CVS:S1S2 regular Abdomen:soft non tender, non distended Extremities: Left AKA Neurology: Non focal Skin: no rash   Data Review:    CBC Recent Labs  Lab 03/30/20 1412 03/31/20 0631 04/01/20 0600  WBC 10.5 7.9 11.7*  HGB 9.3* 8.6* 8.3*  HCT 31.3* 29.1* 27.3*  PLT 257 219 174  MCV 95.1 97.0 92.9  MCH 28.3 28.7 28.2  MCHC 29.7* 29.6* 30.4  RDW 15.5 15.5 15.4  LYMPHSABS 0.9 0.7 0.8  MONOABS 1.3* 0.2 1.0  EOSABS 0.3 0.0 0.0  BASOSABS 0.1 0.0 0.0    Chemistries  Recent Labs  Lab 03/30/20 1412 03/31/20 0631 04/01/20 0600  NA 142 142 144  K 4.0 3.6 3.5  CL 110 114* 114*  CO2 20* 14* 16*  GLUCOSE 93 122* 123*  BUN 45* 46* 51*  CREATININE 4.73* 4.64* 4.56*  CALCIUM 8.9 8.0* 8.4*  MG  --  1.6* 2.2  AST 28 32 40  ALT 15 17 19   ALKPHOS 135* 112 117  BILITOT 0.7 0.8 0.6   ------------------------------------------------------------------------------------------------------------------ No results for input(s): CHOL, HDL, LDLCALC, TRIG, CHOLHDL, LDLDIRECT in the last 72 hours.  Lab Results  Component Value Date   HGBA1C 5.3 12/29/2015   ------------------------------------------------------------------------------------------------------------------ No results for input(s): TSH, T4TOTAL, T3FREE, THYROIDAB in the last 72 hours.  Invalid input(s): FREET3 ------------------------------------------------------------------------------------------------------------------ Recent Labs    03/31/20 0631 04/01/20 0600  FERRITIN 72 141    Coagulation profile Recent Labs  Lab 03/30/20 1412  INR 1.2    Recent Labs    03/31/20 0631 04/01/20 0600  DDIMER 2.67* 2.02*    Cardiac Enzymes No results for input(s): CKMB, TROPONINI, MYOGLOBIN in the last 168 hours.  Invalid input(s):  CK ------------------------------------------------------------------------------------------------------------------    Component Value Date/Time   BNP 1,490.5 (H) 03/02/2020 0850    Micro Results Recent Results (from the past 240 hour(s))  Blood Culture (routine x 2)     Status: None (Preliminary result)   Collection Time: 03/30/20  2:00 PM   Specimen: BLOOD  Result Value Ref Range Status   Specimen Description BLOOD LEFT ANTECUBITAL  Final   Special Requests   Final    BOTTLES DRAWN AEROBIC AND ANAEROBIC Blood Culture adequate volume   Culture   Final    NO GROWTH 2 DAYS Performed at [facility], [address], [place], [place] [address]    Report Status PENDING  Incomplete  Blood Culture (routine x 2)     Status: None (Preliminary result)   Collection Time: 03/30/20  2:00 PM   Specimen: BLOOD  Result Value Ref Range Status   Specimen Description BLOOD RIGHT ANTECUBITAL  Final   Special Requests   Final    BOTTLES DRAWN AEROBIC AND ANAEROBIC Blood Culture adequate volume   Culture   Final    NO GROWTH 2 DAYS Performed at [facility], [place] [address], [place], [place] [address]    Report Status PENDING  Incomplete  Urine culture     Status: None   Collection Time: 03/30/20  3:22 PM   Specimen: In/Out Cath Urine  Result Value Ref Range Status   Specimen Description IN/OUT CATH URINE  Final   Special Requests NONE  Final   Culture   Final    NO GROWTH Performed at [facility], [place] [address], [place], [place] [address]    Report Status 03/31/2020 FINAL  Final    Radiology Reports CT Head Wo Contrast  Result Date: 03/30/2020 CLINICAL DATA:  Mental status change. EXAM: CT HEAD WITHOUT CONTRAST TECHNIQUE: Contiguous axial images were obtained from the base of the skull through the vertex without intravenous contrast. COMPARISON:  CT and MRI March 02, 2020. FINDINGS: Brain: No evidence of acute large vascular territory infarction, hemorrhage,  hydrocephalus, extra-axial collection or mass lesion/mass effect. Similar patchy white matter hypoattenuation, most likely related to chronic microvascular ischemic disease. Similar remote infarcts in the cerebellum, largest on the right. Vascular: Calcific atherosclerosis. No hyperdense vessel identified. Skull: No acute fracture. Sinuses/Orbits: Near complete opacification left maxillary sinus with internal hyperdensity and maxillary sinus wall thickening suggesting chronicity. Unremarkable orbits. Other: No mastoid effusions. IMPRESSION: No evidence of acute intracranial abnormality. 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 Left maxillary sinus disease. Internal hyperdensity may  represent inspissated secretions and/or fungal colonization. Electronically Signed   By: Margaretha Sheffield MD   On: 03/30/2020 15:11   MR Brain Wo Contrast (neuro protocol)  Result Date: 03/02/2020 CLINICAL DATA:  Mental status change EXAM: MRI HEAD WITHOUT CONTRAST TECHNIQUE: Multiplanar, multiecho pulse sequences of the brain and surrounding structures were obtained without intravenous contrast. COMPARISON:  04/07/2019 FINDINGS: Brain: There is no acute infarction or intracranial hemorrhage. Moderate size chronic right cerebellar infarct. Additional tiny bilateral chronic cerebellar infarcts. Patchy and confluent areas of T2 hyperintensity supratentorial white matter are nonspecific but probably reflect stable moderate to advanced chronic microvascular ischemic changes. There is no intracranial mass, mass effect, or edema. There is no hydrocephalus or extra-axial fluid collection. Ventricles and sulci are stable in size and configuration. Vascular: Major vessel flow voids at the skull base are preserved. Skull and upper cervical spine: Normal marrow signal is preserved. Sinuses/Orbits: Chronic left maxillary sinusitis with persistent circumferential mucosal thickening. Minor  mucosal thickening elsewhere. Orbits are unremarkable. Other: Incidental note is made of partial empty sella. Mastoid air cells are clear. IMPRESSION: No acute infarction, hemorrhage, or mass. Chronic cerebellar infarcts. Moderate to advanced chronic microvascular ischemic changes. Electronically Signed   By: Macy Mis M.D.   On: 03/02/2020 18:19   US RENAL  Result Date: 03/02/2020 CLINICAL DATA:  Acute renal injury EXAM: RENAL / URINARY TRACT ULTRASOUND COMPLETE COMPARISON:  02/08/2019 FINDINGS: Right Kidney: Renal measurements: 6.6 x 4.3 x 4.4 cm. = volume: 60 mL. Diffuse increased echogenicity is noted. Cortical thinning is seen as well. No mass lesion or hydronephrosis is noted. Left Kidney: Renal measurements: 6.8 x 3.9 x 3.2 cm. = volume: 46 mL. Increased echogenicity is noted. No mass lesion or hydronephrosis is seen. Bladder: Appears normal for degree of bladder distention. Other: None. IMPRESSION: Small kidneys with increased echogenicity consistent with chronic medical renal disease. No obstructive changes are noted. Electronically Signed   By: Inez Catalina M.D.   On: 03/02/2020 22:08   DG Chest Port 1 View  Result Date: 03/30/2020 CLINICAL DATA:  Sepsis. EXAM: PORTABLE CHEST 1 VIEW COMPARISON:  March 02, 2020. FINDINGS: Stable cardiomegaly. No pneumothorax or pleural effusion is noted. Both lungs are clear. The visualized skeletal structures are unremarkable. IMPRESSION: No active disease. Electronically Signed   By: Marijo Conception M.D.   On: 03/30/2020 14:29   DG Chest Port 1V same Day  Result Date: 03/31/2020 CLINICAL DATA:  74 year old female with shortness of breath. COVID-19. EXAM: PORTABLE CHEST 1 VIEW COMPARISON:  Portable chest 0222 hours today. Chest CT 03/02/2020 and earlier. FINDINGS: Portable AP semi upright view at 0714 hours. Stable cardiomegaly and mediastinal contours. Visualized tracheal air column is within normal limits. Improved lung volumes from earlier today.  Diffuse reticulonodular increased pulmonary opacity. No pneumothorax, pleural effusion or confluent opacity. No acute osseous abnormality identified. IMPRESSION: Mild diffuse reticulonodular opacity suspicious for COVID-19 pneumonia in this setting. Chronic cardiomegaly. No pleural effusion. Electronically Signed   By: Genevie Ann M.D.   On: 03/31/2020 07:41

## 2020-04-01 NOTE — TOC Initial Note (Addendum)
Transition of Care [facility]) - Initial/Assessment Note    Patient Details  Name: Stephanie Griffith MRN: [id number] Date of Birth: October 03, 1945  [facility] CM/SW Contact:    Verdell Carmine, RN Phone Number: 04/01/2020, 9:47 AM  Clinical Narrative:                 74 YO admitted from home with encephalopathy, UTI sepsis COVID. She lives with family, last received [place] services by [facility] in [facility] according to [facility].  Is confused at times currently, may need oxygen at home, will likely need home Health, Home would be the least restrictive environment for mentation.   CM will follow for needs. CSW will monitor if needed higher level of care SNF post discharge.   1300 attempted to call into room to discuss discharge planning., no answer called daughter and left confidential voice message to return my call to discuss discharge planning. .   Expected Discharge Plan: [place] Barriers to Discharge: Continued Medical Work up   Patient Goals and CMS Choice        Expected Discharge Plan and Services Expected Discharge Plan: [place] In-house Referral: Clinical Social Work Discharge Planning Services: CM Consult                                          Prior Living Arrangements/Services   Lives with:: Adult Children Patient language and need for interpreter reviewed:: Yes        Need for Family Participation in Patient Care: Yes (Comment) Care giver support system in place?: Yes (comment)   Criminal Activity/Legal Involvement Pertinent to Current Situation/Hospitalization: No - Comment as needed  Activities of Daily Living Home Assistive Devices/Equipment: Wheelchair ADL Screening (condition at time of admission) Patient's cognitive ability adequate to safely complete daily activities?: No Is the patient deaf or have difficulty hearing?: No Does the patient have difficulty seeing, even when wearing glasses/contacts?: No Does the  patient have difficulty concentrating, remembering, or making decisions?: Yes Patient able to express need for assistance with ADLs?: Yes Does the patient have difficulty dressing or bathing?: Yes Independently performs ADLs?: No Communication: Independent Dressing (OT): Needs assistance Is this a change from baseline?: Pre-admission baseline Grooming: Needs assistance Is this a change from baseline?: Pre-admission baseline Feeding: Independent Bathing: Needs assistance Is this a change from baseline?: Pre-admission baseline Toileting: Needs assistance Is this a change from baseline?: Pre-admission baseline In/Out Bed: Needs assistance Is this a change from baseline?: Pre-admission baseline Walks in Home: Dependent Is this a change from baseline?: Pre-admission baseline Does the patient have difficulty walking or climbing stairs?: Yes Weakness of Legs: Left Weakness of Arms/Hands: None  Permission Sought/Granted                  Emotional Assessment       Orientation: : Fluctuating Orientation (Suspected and/or reported Sundowners) Alcohol / Substance Use: Not Applicable Psych Involvement: No (comment)  Admission diagnosis:  SOB (shortness of breath) [R06.02] Encephalopathy [G93.40] Encephalopathy due to COVID-19 virus [U07.1, G93.49] COVID-19 [U07.1] Patient Active Problem List   Diagnosis Date Noted  . Encephalopathy due to COVID-19 virus 03/30/2020  . Dyslipidemia 03/30/2020  . Acute renal failure superimposed on stage 5 chronic kidney disease, not on chronic dialysis ([place]) 03/03/2020  . Chronic diastolic CHF (congestive heart failure) ([place]) 03/03/2020  . Acute  metabolic encephalopathy 24/26/8341  . Acute on chronic diastolic CHF (congestive heart failure) ([place]) 02/06/2019  . Chronic renal insufficiency, stage IV (severe) ([place]) 02/06/2019  . Metabolic acidosis 96/22/2979  . Pressure injury of skin 01/03/2016  . Acute hypoxemic respiratory failure ([place])   .  Acute pulmonary edema (HCC)   . Hyperkalemia 12/30/2015  . Incarcerated hernia 12/30/2015  . Phantom limb pain ([place]) 08/13/2011  . PVD (peripheral vascular disease) with claudication ([place]) 07/26/2011  . S/P AKA (above knee amputation) ([place]) 06/15/2011  . NSTEMI (non-ST elevated myocardial infarction) ([place]) 06/09/2011  . Acute diastolic heart failure ([place]) 06/09/2011  . Atrial fibrillation ([place]) 06/08/2011  . Cholelithiasis and cholecystitis without obstruction 06/03/2011  . Ventral hernia 06/03/2011  . Acute on chronic renal failure ([place]) 06/02/2011  . PAD (peripheral artery disease) ([place]) 06/02/2011  . 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 Abdominal pain 05/31/2011  . Leukocytosis 05/31/2011  . Hyponatremia 05/31/2011  . Hyperglycemia 05/31/2011  . Normocytic anemia 12/22/2007  . Chronic kidney disease with symptom management only, stage 5 ([place]) 12/22/2007  . CHEST PAIN, HX OF 12/22/2007  . Essential hypertension, benign 12/10/2007   PCP:  [name], MD Pharmacy:   [id number] - , [place] - [address] [place] [address] Phone: [phone] Fax: [phone]     Social Determinants of Health (SDOH) Interventions    Readmission Risk Interventions No flowsheet data found.

## 2020-04-02 DIAGNOSIS — E785 Hyperlipidemia, unspecified: Secondary | ICD-10-CM | POA: Diagnosis not present

## 2020-04-02 DIAGNOSIS — N185 Chronic kidney disease, stage 5: Secondary | ICD-10-CM | POA: Diagnosis not present

## 2020-04-02 DIAGNOSIS — U071 COVID-19: Secondary | ICD-10-CM | POA: Diagnosis not present

## 2020-04-02 DIAGNOSIS — I5032 Chronic diastolic (congestive) heart failure: Secondary | ICD-10-CM | POA: Diagnosis not present

## 2020-04-02 LAB — COMPREHENSIVE METABOLIC PANEL
ALT: 18 U/L (ref 0–44)
AST: 47 U/L — ABNORMAL HIGH (ref 15–41)
Albumin: 2.5 g/dL — ABNORMAL LOW (ref 3.5–5.0)
Alkaline Phosphatase: 105 U/L (ref 38–126)
Anion gap: 13 (ref 5–15)
BUN: 55 mg/dL — ABNORMAL HIGH (ref 8–23)
CO2: 16 mmol/L — ABNORMAL LOW (ref 22–32)
Calcium: 8.2 mg/dL — ABNORMAL LOW (ref 8.9–10.3)
Chloride: 115 mmol/L — ABNORMAL HIGH (ref 98–111)
Creatinine, Ser: 4.57 mg/dL — ABNORMAL HIGH (ref 0.44–1.00)
GFR, Estimated: 10 mL/min — ABNORMAL LOW (ref >60)
Glucose, Bld: 128 mg/dL — ABNORMAL HIGH (ref 70–99)
Potassium: 4.1 mmol/L (ref 3.5–5.1)
Sodium: 144 mmol/L (ref 135–145)
Total Bilirubin: 1.1 mg/dL (ref 0.3–1.2)
Total Protein: 7 g/dL (ref 6.5–8.1)

## 2020-04-02 LAB — CBC WITH DIFFERENTIAL/PLATELET
Abs Immature Granulocytes: 0.24 10*3/uL — ABNORMAL HIGH (ref 0.00–0.07)
Basophils Absolute: 0 10*3/uL (ref 0.0–0.1)
Basophils Relative: 0 %
Eosinophils Absolute: 0 10*3/uL (ref 0.0–0.5)
Eosinophils Relative: 0 %
HCT: 27.6 % — ABNORMAL LOW (ref 36.0–46.0)
Hemoglobin: 8.5 g/dL — ABNORMAL LOW (ref 12.0–15.0)
Immature Granulocytes: 2 %
Lymphocytes Relative: 7 %
Lymphs Abs: 0.7 10*3/uL (ref 0.7–4.0)
MCH: 28.5 pg (ref 26.0–34.0)
MCHC: 30.8 g/dL (ref 30.0–36.0)
MCV: 92.6 fL (ref 80.0–100.0)
Monocytes Absolute: 0.4 10*3/uL (ref 0.1–1.0)
Monocytes Relative: 4 %
Neutro Abs: 8.5 10*3/uL — ABNORMAL HIGH (ref 1.7–7.7)
Neutrophils Relative %: 87 %
Platelets: 230 10*3/uL (ref 150–400)
RBC: 2.98 MIL/uL — ABNORMAL LOW (ref 3.87–5.11)
RDW: 15.6 % — ABNORMAL HIGH (ref 11.5–15.5)
WBC: 9.9 10*3/uL (ref 4.0–10.5)
nRBC: 0 % (ref 0.0–0.2)

## 2020-04-02 LAB — C-REACTIVE PROTEIN: CRP: 2 mg/dL — ABNORMAL HIGH (ref <1.0)

## 2020-04-02 MED ORDER — PREDNISONE 20 MG PO TABS: 40.0000 mg | ORAL_TABLET | Freq: Every day | ORAL | Status: DC

## 2020-04-02 MED ORDER — PREDNISONE 20 MG PO TABS
40.0000 mg | ORAL_TABLET | Freq: Every day | ORAL | Status: DC
  Administered 2020-04-03: 40 mg via ORAL
  Filled 2020-04-02: qty 2

## 2020-04-02 NOTE — TOC Initial Note (Signed)
Transition of Care [facility]) - Initial/Assessment Note    Patient Details  Name: Stephanie Griffith MRN: [id number] Date of Birth: 1945/05/26  Transition of [facility]) CM/SW Contact:    Gabrielle Dare Phone Number: 04/02/2020, 4:13 PM  Clinical Narrative:                 CSW spoke with pt's daughter [name], [phone].  Pt has lived with daughter for 4 years in a single story house.  According to pt's daughter [name] pt is total care.  [name] provided care for pt but will not be able to continue taking care of pt due to pt's dementia and working full time.  Pt is fully vaccinated.  Received last dosage in October, 2021 with moderna.  Pt's daughter is agreeable for SNF placement for pt.  Expected Discharge Plan: Skilled Nursing Facility Barriers to Discharge: Continued Medical Work up,SNF Pending bed offer,Insurance Authorization   Patient Goals and CMS Choice Patient states their goals for this hospitalization and ongoing recovery are:: Per pt's daughter " to be able to assist with her care and bath". CMS Medicare.gov Compare Post Acute Care list provided to:: Patient Represenative (must comment) (pt oriented to self. Information was given to daughter, [name]) Choice offered to / list presented to : Adult Children  Expected Discharge Plan and Services Expected Discharge Plan: [place] In-house Referral: Clinical Social Work Discharge Planning Services: CM Consult   Living arrangements for the past 2 months: [place]                                      Prior Living Arrangements/Services Living arrangements for the past 2 months: Single Family Home Lives with:: Adult Children Patient language and need for interpreter reviewed:: No Do you feel safe going back to the place where you live?: No   due to pt's dementia per daughter [name]  Need for Family Participation in Patient Care: Yes (Comment) Care giver support system in place?: Yes  (comment)   Criminal Activity/Legal Involvement Pertinent to Current Situation/Hospitalization: No - Comment as needed  Activities of Daily Living Home Assistive Devices/Equipment: Wheelchair ADL Screening (condition at time of admission) Patient's cognitive ability adequate to safely complete daily activities?: No Is the patient deaf or have difficulty hearing?: No Does the patient have difficulty seeing, even when wearing glasses/contacts?: No Does the patient have difficulty concentrating, remembering, or making decisions?: Yes Patient able to express need for assistance with ADLs?: Yes Does the patient have difficulty dressing or bathing?: Yes Independently performs ADLs?: No Communication: Independent Dressing (OT): Needs assistance Is this a change from baseline?: Pre-admission baseline Grooming: Needs assistance Is this a change from baseline?: Pre-admission baseline Feeding: Independent Bathing: Needs assistance Is this a change from baseline?: Pre-admission baseline Toileting: Needs assistance Is this a change from baseline?: Pre-admission baseline In/Out Bed: Needs assistance Is this a change from baseline?: Pre-admission baseline Walks in Home: Dependent Is this a change from baseline?: Pre-admission baseline Does the patient have difficulty walking or climbing stairs?: Yes Weakness of Legs: Left Weakness of Arms/Hands: None  Permission Sought/Granted Permission sought to share information with : Case Manager,Facility Contact Representative,Family Supports Permission granted to share information with : Yes, Verbal Permission Granted  Share Information with NAME: [name]  Permission granted to share info w AGENCY: SNF's  Permission granted to share info w Relationship: Daughter, [name]  Permission granted to share info w Contact Information: yes will need to be updated in system  Emotional Assessment Appearance:: Appears stated  age Attitude/Demeanor/Rapport: Unable to Assess Affect (typically observed): Unable to Assess Orientation: : Oriented to Self Alcohol / Substance Use: Not Applicable Psych Involvement: No (comment)  Admission diagnosis:  SOB (shortness of breath) [R06.02] Encephalopathy [G93.40] Encephalopathy due to COVID-19 virus [U07.1, G93.49] COVID-19 [U07.1] Patient Active Problem List   Diagnosis Date Noted  . Encephalopathy due to COVID-19 virus 03/30/2020  . Dyslipidemia 03/30/2020  . Acute renal failure superimposed on stage 5 chronic kidney disease, not on chronic dialysis ([place]) 03/03/2020  . Chronic diastolic CHF (congestive heart failure) ([place]) 03/03/2020  . Acute metabolic encephalopathy 16/96/7893  . Acute on chronic diastolic CHF (congestive heart failure) ([place]) 02/06/2019  . Chronic renal insufficiency, stage IV (severe) ([place]) 02/06/2019  . Metabolic acidosis 81/04/7508  . Pressure injury of skin 01/03/2016  . Acute hypoxemic respiratory failure ([place])   . Acute pulmonary edema (H[facility])   . Hyperkalemia 12/30/2015  . Incarcerated hernia 12/30/2015  . Phantom limb pain ([place]) 08/13/2011  . PVD (peripheral vascular disease) with claudication ([place]) 07/26/2011  . S/P AKA (above knee amputation) ([place]) 06/15/2011  . NSTEMI (non-ST elevated myocardial infarction) ([place]) 06/09/2011  . Acute diastolic heart failure ([place]) 06/09/2011  . Atrial fibrillation ([place]) 06/08/2011  . Cholelithiasis and cholecystitis without obstruction 06/03/2011  . Ventral hernia 06/03/2011  . Acute on chronic renal failure ([place]) 06/02/2011  . PAD (peripheral artery disease) ([place]) 06/02/2011  . Dry gangrene ([place]) 05/31/2011  . Abdominal pain 05/31/2011  . Leukocytosis 05/31/2011  . Hyponatremia 05/31/2011  . Hyperglycemia 05/31/2011  . Normocytic anemia 12/22/2007  . Chronic kidney disease with symptom management only, stage 5 ([place]) 12/22/2007  . CHEST PAIN, HX OF 12/22/2007  . Essential hypertension, benign  12/10/2007   PCP:  [name], MD Pharmacy:   [id number] - [place], [place] - [address] [place] [address] Phone: [phone] Fax: [phone]     Social Determinants of Health (SDOH) Interventions    Readmission Risk Interventions No flowsheet data found.

## 2020-04-02 NOTE — Progress Notes (Signed)
Attempted to call daughter [name] to update her on her mom, message left.

## 2020-04-02 NOTE — Progress Notes (Signed)
I received a call from a very upset daughter, Christiana Pellant. She was not on the Emergency contact list so I told her I could not share any information with her. She was very upset with me and had [name], another daughter who is on the emergency contact list, call to tell me that it was ok to speak with [place]. I explained to [facility] that we had not been able to contact her or her sister Glenard Haring who is also on the emergency contact list, so the social worker was in contact with another daughter, [name]. Per SW, Rebecca's number was found on EMS run sheet and the patient lives with [name]. I called Caretta back after getting permission from [place] is claiming that we have broken HIPPA by speaking to [place] and she is threatening to call the Police and [employer] on all of [place] here at [facility]. I have informed charge nurse, Elisa of the situation and attempted to call SW but they have left for the day. I hung up the phone with Caretta because she was getting very angry with me and she has attempted to call me back about 6 times but I had patient care to provide and I am now wanting to stay out of the family dynamics so I did not answer the phone.

## 2020-04-02 NOTE — Progress Notes (Addendum)
PROGRESS NOTE                                                                                                                                                                                                             Patient Demographics:    Stephanie Griffith, is a 75 y.o. female, DOB - 11-09-1945, WGY:[id number]  Outpatient Primary MD for the patient is [name], MD   Admit date - 03/30/2020   LOS - 3  Chief Complaint  Patient presents with  . Code Sepsis       Brief Narrative: Patient is a 75 y.o. female with PMHx of CKD stage V, PAD-s/p left AKA, PAF, chronic diastolic heart failure-presented to the hospital with cough, rhinorrhea-and confusion. She was found to have acute metabolic encephalopathy in the setting of COVID-19 pneumonia. See below for further details  COVID-19 vaccinated status:   Significant Events: 12/29>> Admit to [facility] for COVID-19 pneumonia/metabolic encephalopathy  Significant studies: 12/29>> CT head: No evidence of acute intracranial abnormality. 12/29>>Chest x-ray: No active disease 12/30>> chest x-ray: Diffuse reticulonodular opacity-suspicious for COVID-19 pneumonia  COVID-19 medications: Steroids: 12/29>> Remdesivir: 12/29>>  Antibiotics: None  Microbiology data: 12/29 >>blood culture: No growth  Procedures: None  Consults: None  DVT prophylaxis: heparin injection 5,000 Units Start: 03/30/20 2200    Subjective:   Very calm-quiet-no she is in the hospital-denies shortness of breath-remains on room air.   Assessment  & Plan :   Acute metabolic encephalopathy due to COVID-19 infection: Overall improved-suspect not far from usual baseline-at risk for delirium during this hospital stay-continue nightly Seroquel.  As noted in my prior notes-suspect she has some amount of vascular dementia at baseline.  Unable to get in touch with family for more collaborative information-see  below.   Covid 19 Viral pneumonia: Improved-on room air-continue steroid/Remdesivir-inflammatory markers downtrending.  Will complete Remdesivir on 1/2.  Fever: afebrile O2 requirements:  SpO2: (!) 88 %   COVID-19 Labs: Recent Labs    03/31/20 0631 03/31/20 0636 04/01/20 0600 04/02/20 0505  DDIMER 2.67*  --  2.02*  --   FERRITIN 72  --  141  --   LDH  --  256*  --   --   CRP 3.0*  --  2.8* 2.0*       Component Value Date/Time   BNP 1,490.5 (H) 03/02/2020  4098    Recent Labs  Lab 03/31/20 0636  PROCALCITON 0.17    Lab Results  Component Value Date   SARSCOV2NAA NEGATIVE 03/02/2020   SARSCOV2NAA NEGATIVE 02/12/2019   [place] NEGATIVE 02/06/2019     Prone/Incentive Spirometry: encouraged  incentive spirometry use 3-4/hour.\\  Stage V CKD: Creatinine close to baseline-Per prior documentation-she does not desire HD.  [place] metabolic acidosis: Probably secondary to CKD-continue oral bicarbonate 1300 mg twice daily.  Hypomagnesemia: Repleted  Normocytic anemia: Likely due to CKD-hemoglobin stable and close to baseline-follow  HTN: BP controlled-continue Norvasc/metoprolol.  HLD: Continue Lipitor  Chronic diastolic heart failure: Compensated-resume Lasix  History of moderate to severe pulmonary hypertension: Continue outpatient follow-up with cardiology  History of PAF: Reviewed prior cardiology consultation note November 2020-remote history of isolated episode A. fib-given issues with anemia-was never started on anticoagulation. On aspirin.  PAD-s/p left AKA  Deconditioning/debility/disposition: Patient with numerous medical problems-s/p left AKA-not sure of what her functional baseline is-evaluated by PT/OT-recommendations are for SNF unless family able to provide 24/7 care.  Have tried calling patient's daughter several times-over the past few days-I have not been able to contact her.  Have asked social work/case management to see if they can get in  contact with family-to see if family can provide 24/7 care-otherwise patient will require SNF.  Addendum: per SW patient lives with daughter [name] Jones-(not listed in [phone]-she would like patient to go to SNF.      Obesity: Estimated body mass index is 30.1 kg/m as calculated from the following:   Height as of this encounter: 5\' 8"  (1.727 m).   Weight as of this encounter: 89.8 kg.   ABG:    Component Value Date/Time   PHART 7.254 (L) 02/06/2019 0312   PCO2ART 28.3 (L) 02/06/2019 0312   PO2ART 95.0 02/06/2019 0312   HCO3 23.8 02/13/2019 0811   TCO2 16 (L) 04/07/2019 1103   ACIDBASEDEF 1.0 02/13/2019 0804   ACIDBASEDEF 1.0 02/13/2019 0804   O2SAT 70.0 02/13/2019 0811    Vent Settings: N/A   Condition - Stable  Family Communication  :  Daughter Monique-[phone]-called again on 1/1-no response.   Addendum: spoke with Daughter-Rebecca Ronnald Ramp -[phone] over the phone on 1/1  Code Status :  Full Code  Diet :  Diet Order            Diet Heart Room service appropriate? Yes; Fluid consistency: Thin  Diet effective now                  Disposition Plan  :   Status is: Inpatient  Remains inpatient appropriate because:Inpatient level of care appropriate due to severity of illness   Dispo: The patient is from: Home              Anticipated d/c is to: Home              Anticipated d/c date is: 1-2 days              Patient currently is not medically stable to d/c.  Barriers to discharge: Complete 5 days of IV Remdesivir-awaiting safe disposition-await social work evaluation.  Antimicorbials  :    Anti-infectives (From admission, onward)   Start     Dose/Rate Route Frequency Ordered Stop   03/31/20 1430  ceFEPIme (MAXIPIME) 2 g in sodium chloride 0.9 % 100 mL IVPB  Status:  Discontinued        2 g 200 mL/hr over 30 Minutes Intravenous Every 24 hours  03/30/20 1640 03/30/20 1800   03/31/20 1000  remdesivir 100 mg in sodium chloride 0.9 % 100  mL IVPB       "Followed by" Linked Group Details   100 mg 200 mL/hr over 30 Minutes Intravenous Daily 03/30/20 1800 04/04/20 0959   03/30/20 1800  remdesivir 200 mg in sodium chloride 0.9% 250 mL IVPB       "Followed by" Linked Group Details   200 mg 580 mL/hr over 30 Minutes Intravenous Once 03/30/20 1800 03/30/20 2040   03/30/20 1515  vancomycin (VANCOREADY) IVPB 1750 mg/350 mL        1,750 mg 175 mL/hr over 120 Minutes Intravenous  Once 03/30/20 1510 03/30/20 1815   03/30/20 1415  ceFEPIme (MAXIPIME) 2 g in sodium chloride 0.9 % 100 mL IVPB        2 g 200 mL/hr over 30 Minutes Intravenous  Once 03/30/20 1412 03/30/20 1613   03/30/20 1415  metroNIDAZOLE (FLAGYL) IVPB 500 mg        500 mg 100 mL/hr over 60 Minutes Intravenous  Once 03/30/20 1412 03/30/20 1613   03/30/20 1415  vancomycin (VANCOCIN) IVPB 1000 mg/200 mL premix  Status:  Discontinued        1,000 mg 200 mL/hr over 60 Minutes Intravenous  Once 03/30/20 1412 03/30/20 1417      Inpatient Medications  Scheduled Meds: . amLODipine  10 mg Oral Daily  . vitamin C  500 mg Oral Daily  . aspirin EC  81 mg Oral Daily  . atorvastatin  40 mg Oral q1800  . calcitRIOL  0.5 mcg Oral Daily  . docusate sodium  100 mg Oral BID  . furosemide  40 mg Oral Daily  . gabapentin  100 mg Oral Daily  . heparin injection (subcutaneous)  5,000 Units Subcutaneous Q8H  . methylPREDNISolone (SOLU-MEDROL) injection  0.5 mg/kg Intravenous Q12H   Followed by  . predniSONE  50 mg Oral Daily  . metoprolol tartrate  50 mg Oral BID  . pantoprazole  40 mg Oral BID AC  . QUEtiapine  25 mg Oral QHS  . sodium bicarbonate  1,300 mg Oral BID  . sodium chloride flush  3 mL Intravenous Q12H  . sodium chloride flush  3 mL Intravenous Q12H  . zinc sulfate  220 mg Oral Daily   Continuous Infusions: . sodium chloride    . remdesivir 100 mg in NS 100 mL 100 mg (04/02/20 1110)   PRN Meds:.sodium chloride, acetaminophen, albuterol, bisacodyl,  chlorpheniramine-HYDROcodone, guaiFENesin-dextromethorphan, haloperidol lactate, hydrALAZINE, loperamide, ondansetron **OR** ondansetron (ZOFRAN) IV, oxyCODONE, polyethylene glycol, sodium chloride flush, sodium phosphate   Time Spent in minutes  25   See all Orders from today for further details   Oren Binet M.D on 04/02/2020 at 3:03 PM  To page go to [email or web address] - use universal password  [facility] -  Office  [phone]    Objective:   Vitals:   04/02/20 0415 04/02/20 0800 04/02/20 0941 04/02/20 1200  BP: (!) 171/82 (!) 167/121 (!) 164/76 (!) 156/78  Pulse: 65 80  69  Resp: 18 20 17  (!) 23  Temp: 97.6 F (36.4 C) 97.6 F (36.4 C)  97.6 F (36.4 C)  TempSrc: Oral Oral  Oral  SpO2: 100%  100% (!) 88%  Weight:      Height:        Wt Readings from Last 3 Encounters:  03/30/20 89.8 kg  03/05/20 89.1 kg  02/18/19 70.2 kg     Intake/Output  Summary (Last 24 hours) at 04/02/2020 1503 Last data filed at 04/02/2020 1000 Gross per 24 hour  Intake 360 ml  Output --  Net 360 ml     Physical Exam Gen Exam:Alert awake-not in any distress HEENT:atraumatic, normocephalic Chest: B/L clear to auscultation anteriorly CVS:S1S2 regular Abdomen:soft non tender, non distended Extremities: Left AKA Neurology: Non focal Skin: no rash   Data Review:    CBC Recent Labs  Lab 03/30/20 1412 03/31/20 0631 04/01/20 0600 04/02/20 0505  WBC 10.5 7.9 11.7* 9.9  HGB 9.3* 8.6* 8.3* 8.5*  HCT 31.3* 29.1* 27.3* 27.6*  PLT 257 219 174 230  MCV 95.1 97.0 92.9 92.6  MCH 28.3 28.7 28.2 28.5  MCHC 29.7* 29.6* 30.4 30.8  RDW 15.5 15.5 15.4 15.6*  LYMPHSABS 0.9 0.7 0.8 0.7  MONOABS 1.3* 0.2 1.0 0.4  EOSABS 0.3 0.0 0.0 0.0  BASOSABS 0.1 0.0 0.0 0.0    Chemistries  Recent Labs  Lab 03/30/20 1412 03/31/20 0631 04/01/20 0600 04/02/20 0505  NA 142 142 144 144  K 4.0 3.6 3.5 4.1  CL 110 114* 114* 115*  CO2 20* 14* 16* 16*  GLUCOSE 93 122* 123* 128*  BUN 45* 46*  51* 55*  CREATININE 4.73* 4.64* 4.56* 4.57*  CALCIUM 8.9 8.0* 8.4* 8.2*  MG  --  1.6* 2.2  --   AST 28 32 40 47*  ALT 15 17 19 18   ALKPHOS 135* 112 117 105  BILITOT 0.7 0.8 0.6 1.1   ------------------------------------------------------------------------------------------------------------------ No results for input(s): CHOL, HDL, LDLCALC, TRIG, CHOLHDL, LDLDIRECT in the last 72 hours.  Lab Results  Component Value Date   HGBA1C 5.3 12/29/2015   ------------------------------------------------------------------------------------------------------------------ No results for input(s): TSH, T4TOTAL, T3FREE, THYROIDAB in the last 72 hours.  Invalid input(s): FREET3 ------------------------------------------------------------------------------------------------------------------ Recent Labs    03/31/20 0631 04/01/20 0600  FERRITIN 72 141    Coagulation profile Recent Labs  Lab 03/30/20 1412  INR 1.2    Recent Labs    03/31/20 0631 04/01/20 0600  DDIMER 2.67* 2.02*    Cardiac Enzymes No results for input(s): CKMB, TROPONINI, MYOGLOBIN in the last 168 hours.  Invalid input(s): CK ------------------------------------------------------------------------------------------------------------------    Component Value Date/Time   BNP 1,490.5 (H) 03/02/2020 0850    Micro Results Recent Results (from the past 240 hour(s))  Blood Culture (routine x 2)     Status: None (Preliminary result)   Collection Time: 03/30/20  2:00 PM   Specimen: BLOOD  Result Value Ref Range Status   Specimen Description BLOOD LEFT ANTECUBITAL  Final   Special Requests   Final    BOTTLES DRAWN AEROBIC AND ANAEROBIC Blood Culture adequate volume   Culture   Final    NO GROWTH 3 DAYS Performed at [facility], [address] [place], [place] [address]    Report Status PENDING  Incomplete  Blood Culture (routine x 2)     Status: None (Preliminary result)   Collection Time: 03/30/20  2:00  PM   Specimen: BLOOD  Result Value Ref Range Status   Specimen Description BLOOD RIGHT ANTECUBITAL  Final   Special Requests   Final    BOTTLES DRAWN AEROBIC AND ANAEROBIC Blood Culture adequate volume   Culture   Final    NO GROWTH 3 DAYS Performed at [facility], [place] [address], [place], [place] [address]    Report Status PENDING  Incomplete  Urine culture     Status: None   Collection Time: 03/30/20  3:22 PM   Specimen: In/Out Cath Urine  Result Value Ref Range Status   Specimen Description IN/OUT CATH URINE  Final   Special Requests NONE  Final   Culture   Final    NO GROWTH Performed at [facility], [address], [place], [place] [address]    Report Status 03/31/2020 FINAL  Final    Radiology Reports CT Head Wo Contrast  Result Date: 03/30/2020 CLINICAL DATA:  Mental status change. EXAM: CT HEAD WITHOUT CONTRAST TECHNIQUE: Contiguous axial images were obtained from the base of the skull through the vertex without intravenous contrast. COMPARISON:  CT and MRI March 02, 2020. FINDINGS: Brain: No evidence of acute large vascular territory infarction, hemorrhage, hydrocephalus, extra-axial collection or mass lesion/mass effect. Similar patchy white matter hypoattenuation, most likely related to chronic microvascular ischemic disease. Similar remote infarcts in the cerebellum, largest on the right. Vascular: Calcific atherosclerosis. No hyperdense vessel identified. Skull: No acute fracture. Sinuses/Orbits: Near complete opacification left maxillary sinus with internal hyperdensity and maxillary sinus wall thickening suggesting chronicity. Unremarkable orbits. Other: No mastoid effusions. IMPRESSION: No evidence of acute intracranial abnormality. 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 Left maxillary sinus disease. Internal hyperdensity may represent inspissated secretions and/or fungal colonization. Electronically  Signed   By: Margaretha Sheffield MD   On: 03/30/2020 15:11   DG Chest Port 1 View  Result Date: 03/30/2020 CLINICAL DATA:  Sepsis. EXAM: PORTABLE CHEST 1 VIEW COMPARISON:  March 02, 2020. FINDINGS: Stable cardiomegaly. No pneumothorax or pleural effusion is noted. Both lungs are clear. The visualized skeletal structures are unremarkable. IMPRESSION: No active disease. Electronically Signed   By: Marijo Conception M.D.   On: 03/30/2020 14:29   DG Chest Port 1V same Day  Result Date: 03/31/2020 CLINICAL DATA:  75 year old female with shortness of breath. COVID-19. EXAM: PORTABLE CHEST 1 VIEW COMPARISON:  Portable chest 0222 hours today. Chest CT 03/02/2020 and earlier. FINDINGS: Portable AP semi upright view at 0714 hours. Stable cardiomegaly and mediastinal contours. Visualized tracheal air column is within normal limits. Improved lung volumes from earlier today. Diffuse reticulonodular increased pulmonary opacity. No pneumothorax, pleural effusion or confluent opacity. No acute osseous abnormality identified. IMPRESSION: Mild diffuse reticulonodular opacity suspicious for COVID-19 pneumonia in this setting. Chronic cardiomegaly. No pleural effusion. Electronically Signed   By: Genevie Ann M.D.   On: 03/31/2020 07:41

## 2020-04-02 NOTE — NC FL2 (Signed)
[place] LEVEL OF CARE SCREENING TOOL     IDENTIFICATION  Patient Name: Stephanie Griffith Birthdate: [birth year] Sex: female Admission Date (Current Location): 03/30/2020  [facility] and [place] Number:  Herbalist and Address:  [facility] [place] [address] [place], [place] [address]      Provider Number: [id number]  Attending Physician Name and Address:  Jonetta Osgood, MD  Relative Name and Phone Number:  Gearldine Looney [phone]    Current Level of Care: Hospital Recommended Level of Care: [place] Prior Approval Number:    Date Approved/Denied:   PASRR Number: [id number]  Discharge Plan: SNF    Current Diagnoses: Patient Active Problem List   Diagnosis Date Noted  . Encephalopathy due to COVID-19 virus 03/30/2020  . Dyslipidemia 03/30/2020  . Acute renal failure superimposed on stage 5 chronic kidney disease, not on chronic dialysis ([place]) 03/03/2020  . Chronic diastolic CHF (congestive heart failure) ([place]) 03/03/2020  . Acute metabolic encephalopathy 23/53/6144  . Acute on chronic diastolic CHF (congestive heart failure) ([place]) 02/06/2019  . Chronic renal insufficiency, stage IV (severe) ([place]) 02/06/2019  . Metabolic acidosis 31/54/0086  . Pressure injury of skin 01/03/2016  . Acute hypoxemic respiratory failure ([place])   . Acute pulmonary edema (HCC)   . Hyperkalemia 12/30/2015  . Incarcerated hernia 12/30/2015  . Phantom limb pain ([place]) 08/13/2011  . PVD (peripheral vascular disease) with claudication ([place]) 07/26/2011  . S/P AKA (above knee amputation) ([place]) 06/15/2011  . NSTEMI (non-ST elevated myocardial infarction) ([place]) 06/09/2011  . Acute diastolic heart failure ([place]) 06/09/2011  . Atrial fibrillation ([place]) 06/08/2011  . Cholelithiasis and cholecystitis without obstruction 06/03/2011  . Ventral hernia 06/03/2011  . Acute on chronic renal failure ([place]) 06/02/2011  . PAD (peripheral artery  disease) ([place]) 06/02/2011  . Dry gangrene ([place]) 05/31/2011  . Abdominal pain 05/31/2011  . Leukocytosis 05/31/2011  . Hyponatremia 05/31/2011  . Hyperglycemia 05/31/2011  . Normocytic anemia 12/22/2007  . Chronic kidney disease with symptom management only, stage 5 ([place]) 12/22/2007  . CHEST PAIN, HX OF 12/22/2007  . Essential hypertension, benign 12/10/2007    Orientation RESPIRATION BLADDER Height & Weight     Self  Normal Incontinent,External catheter Weight: 197 lb 15.6 oz (89.8 kg) Height:  5\' 8"  (172.7 cm)  BEHAVIORAL SYMPTOMS/MOOD NEUROLOGICAL BOWEL NUTRITION STATUS      Incontinent Diet (See DC summary)  AMBULATORY STATUS COMMUNICATION OF NEEDS Skin   Extensive Assist Verbally Surgical wounds (L. leg amputation)                       Personal Care Assistance Level of Assistance  Bathing,Feeding,Dressing Bathing Assistance: Maximum assistance Feeding assistance: Limited assistance Dressing Assistance: Maximum assistance     Functional Limitations Info  Sight,Hearing,Speech Sight Info: Adequate Hearing Info: Adequate Speech Info: Adequate    SPECIAL CARE FACTORS FREQUENCY  PT (By licensed PT),OT (By licensed OT)     PT Frequency: 5x week OT Frequency: 5x week            Contractures      Additional Factors Info  Code Status,Allergies,Psychotropic Code Status Info: Full Allergies Info: Morphine and related Psychotropic Info: Seroquel         Current Medications (04/02/2020):  This is the current hospital active medication list Current Facility-Administered Medications  Medication Dose Route Frequency Provider Last Rate Last Admin  . 0.9 %  sodium chloride infusion  250 mL Intravenous  PRN Karmen Bongo, MD      . acetaminophen (TYLENOL) tablet 650 mg  650 mg Oral Q6H PRN Karmen Bongo, MD      . albuterol (VENTOLIN HFA) 108 (90 Base) MCG/ACT inhaler 2 puff  2 puff Inhalation Q2H PRN Karmen Bongo, MD   2 puff at 04/02/20 0151  . amLODipine  (NORVASC) tablet 10 mg  10 mg Oral Daily Karmen Bongo, MD   10 mg at 04/02/20 0942  . ascorbic acid (VITAMIN C) tablet 500 mg  500 mg Oral Daily Karmen Bongo, MD   500 mg at 04/02/20 7035  . aspirin EC tablet 81 mg  81 mg Oral Daily Karmen Bongo, MD   81 mg at 04/02/20 0093  . atorvastatin (LIPITOR) tablet 40 mg  40 mg Oral q1800 Karmen Bongo, MD   40 mg at 04/01/20 1805  . bisacodyl (DULCOLAX) EC tablet 5 mg  5 mg Oral Daily PRN Karmen Bongo, MD      . calcitRIOL (ROCALTROL) capsule 0.5 mcg  0.5 mcg Oral Daily Karmen Bongo, MD   0.5 mcg at 04/02/20 0942  . chlorpheniramine-HYDROcodone (TUSSIONEX) 10-8 MG/5ML suspension 5 mL  5 mL Oral Q12H PRN Karmen Bongo, MD      . docusate sodium (COLACE) capsule 100 mg  100 mg Oral BID Karmen Bongo, MD   100 mg at 04/02/20 0943  . furosemide (LASIX) tablet 40 mg  40 mg Oral Daily Jonetta Osgood, MD   40 mg at 04/02/20 0942  . gabapentin (NEURONTIN) capsule 100 mg  100 mg Oral Daily Karmen Bongo, MD   100 mg at 04/02/20 0942  . guaiFENesin-dextromethorphan (ROBITUSSIN DM) 100-10 MG/5ML syrup 10 mL  10 mL Oral Q4H PRN Karmen Bongo, MD      . haloperidol lactate (HALDOL) injection 2 mg  2 mg Intravenous Q6H PRN Karmen Bongo, MD   2 mg at 03/30/20 2137  . heparin injection 5,000 Units  5,000 Units Subcutaneous Lynne Logan, MD   5,000 Units at 04/02/20 1457  . hydrALAZINE (APRESOLINE) injection 5 mg  5 mg Intravenous Q4H PRN Karmen Bongo, MD      . loperamide (IMODIUM) capsule 2 mg  2 mg Oral PRN Jonetta Osgood, MD      . metoprolol tartrate (LOPRESSOR) tablet 50 mg  50 mg Oral BID Karmen Bongo, MD   50 mg at 04/02/20 8182  . ondansetron (ZOFRAN) tablet 4 mg  4 mg Oral Q6H PRN Karmen Bongo, MD       Or  . ondansetron [facility]) injection 4 mg  4 mg Intravenous Q6H PRN Karmen Bongo, MD      . oxyCODONE (Oxy IR/ROXICODONE) immediate release tablet 5 mg  5 mg Oral Q4H PRN Karmen Bongo, MD      .  pantoprazole (PROTONIX) EC tablet 40 mg  40 mg Oral BID AC Jonetta Osgood, MD   40 mg at 04/02/20 0942  . polyethylene glycol (MIRALAX / GLYCOLAX) packet 17 g  17 g Oral Daily PRN Karmen Bongo, MD      . Derrill Memo ON 04/03/2020] predniSONE (DELTASONE) tablet 40 mg  40 mg Oral Q breakfast Ghimire, Shanker M, MD      . QUEtiapine (SEROQUEL) tablet 25 mg  25 mg Oral QHS Jonetta Osgood, MD   25 mg at 04/01/20 2047  . remdesivir 100 mg in sodium chloride 0.9 % 100 mL IVPB  100 mg Intravenous Daily Karmen Bongo, MD 200 mL/hr at 04/02/20 1110 100  mg at 04/02/20 1110  . sodium bicarbonate tablet 1,300 mg  1,300 mg Oral BID Jonetta Osgood, MD   1,300 mg at 04/02/20 0941  . sodium chloride flush (NS) 0.9 % injection 3 mL  3 mL Intravenous Q12H Karmen Bongo, MD   3 mL at 04/02/20 0943  . sodium chloride flush (NS) 0.9 % injection 3 mL  3 mL Intravenous Q12H Karmen Bongo, MD   3 mL at 04/02/20 0943  . sodium chloride flush (NS) 0.9 % injection 3 mL  3 mL Intravenous PRN Karmen Bongo, MD   3 mL at 03/30/20 2249  . zinc sulfate capsule 220 mg  220 mg Oral Daily Karmen Bongo, MD   220 mg at 04/02/20 1308     Discharge Medications: Please see discharge summary for a list of discharge medications.  Relevant Imaging Results:  Relevant Lab Results:   Additional Information SS# [place], [place]

## 2020-04-03 DIAGNOSIS — U071 COVID-19: Secondary | ICD-10-CM | POA: Diagnosis not present

## 2020-04-03 DIAGNOSIS — G9349 Other encephalopathy: Secondary | ICD-10-CM | POA: Diagnosis not present

## 2020-04-03 LAB — COMPREHENSIVE METABOLIC PANEL
ALT: 22 U/L (ref 0–44)
AST: 47 U/L — ABNORMAL HIGH (ref 15–41)
Albumin: 2.7 g/dL — ABNORMAL LOW (ref 3.5–5.0)
Alkaline Phosphatase: 124 U/L (ref 38–126)
Anion gap: 13 (ref 5–15)
BUN: 57 mg/dL — ABNORMAL HIGH (ref 8–23)
CO2: 18 mmol/L — ABNORMAL LOW (ref 22–32)
Calcium: 8.4 mg/dL — ABNORMAL LOW (ref 8.9–10.3)
Chloride: 115 mmol/L — ABNORMAL HIGH (ref 98–111)
Creatinine, Ser: 4.54 mg/dL — ABNORMAL HIGH (ref 0.44–1.00)
GFR, Estimated: 10 mL/min — ABNORMAL LOW (ref >60)
Glucose, Bld: 139 mg/dL — ABNORMAL HIGH (ref 70–99)
Potassium: 4 mmol/L (ref 3.5–5.1)
Sodium: 146 mmol/L — ABNORMAL HIGH (ref 135–145)
Total Bilirubin: 1 mg/dL (ref 0.3–1.2)
Total Protein: 7.3 g/dL (ref 6.5–8.1)

## 2020-04-03 LAB — CBC WITH DIFFERENTIAL/PLATELET
Abs Immature Granulocytes: 0.58 10*3/uL — ABNORMAL HIGH (ref 0.00–0.07)
Basophils Absolute: 0.1 10*3/uL (ref 0.0–0.1)
Basophils Relative: 0 %
Eosinophils Absolute: 0 10*3/uL (ref 0.0–0.5)
Eosinophils Relative: 0 %
HCT: 27.9 % — ABNORMAL LOW (ref 36.0–46.0)
Hemoglobin: 9.1 g/dL — ABNORMAL LOW (ref 12.0–15.0)
Immature Granulocytes: 4 %
Lymphocytes Relative: 6 %
Lymphs Abs: 0.8 10*3/uL (ref 0.7–4.0)
MCH: 29.7 pg (ref 26.0–34.0)
MCHC: 32.6 g/dL (ref 30.0–36.0)
MCV: 91.2 fL (ref 80.0–100.0)
Monocytes Absolute: 1.3 10*3/uL — ABNORMAL HIGH (ref 0.1–1.0)
Monocytes Relative: 9 %
Neutro Abs: 11.1 10*3/uL — ABNORMAL HIGH (ref 1.7–7.7)
Neutrophils Relative %: 81 %
Platelets: 250 10*3/uL (ref 150–400)
RBC: 3.06 MIL/uL — ABNORMAL LOW (ref 3.87–5.11)
RDW: 15.6 % — ABNORMAL HIGH (ref 11.5–15.5)
WBC: 13.8 10*3/uL — ABNORMAL HIGH (ref 4.0–10.5)
nRBC: 0 % (ref 0.0–0.2)

## 2020-04-03 LAB — C-REACTIVE PROTEIN: CRP: 1.8 mg/dL — ABNORMAL HIGH (ref <1.0)

## 2020-04-03 MED ORDER — DEXTROSE 5 % IV SOLN: INTRAVENOUS | Status: AC

## 2020-04-03 NOTE — TOC Progression Note (Addendum)
Transition of Care [facility]) - Progression Note    Patient Details  Name: Stephanie Griffith MRN: [id number] Date of Birth: 1946/03/11  Transition of Care [facility]) CM/SW Contact  [name], RN Phone Number: 04/03/2020, 3:29 PM  Clinical Narrative:    Damaris Schooner w patient's grandson [name] [phone].  He confirms that patient is from home, and prior to admission lived with her daughter [name] [phone]. He confirms that the plan for discharge is for the patient to come to his house at [address] [place] [address]. He states that the plan has been discussed with [name] and she is in agreement, and he is currently at his "Aunt Becky's" house moving out the patient's bed, hospital bed, RW, BSC, WC in preporation for DC.  He states that patient will have 24/7 supervision at his townhouse. He states that currently his uncle, the patient's son, lives with him as well.  [name] would like to be the main person to be contacted for discharge planning.  Spoke w [name] and verified that she is in agreeance with plan for patient to DC to [name]'s care.   Patient with orders for [facility] PT RN HHA SW.  [place]- declined.    Expected Discharge Plan: [facility] Barriers to Discharge: Continued Medical Work up  Expected Discharge Plan and Services Expected Discharge Plan: [place] In-house Referral: Clinical Social Work Discharge Planning Services: CM Consult   Living arrangements for the past 2 months: Single Family Home                           HH Arranged: RN,PT,Nurse's Aide,Social Work           Social Determinants of Health (SDOH) Interventions    Readmission Risk Interventions No flowsheet data found.

## 2020-04-03 NOTE — Progress Notes (Signed)
PROGRESS NOTE                                                                                                                                                                                                             Patient Demographics:    Stephanie Griffith, is a 75 y.o. female, DOB - [birth year], OXB:[id number]  Outpatient Primary MD for the patient is [name], MD   Admit date - 03/30/2020   LOS - 4  Chief Complaint  Patient presents with  . Code Sepsis       Brief Narrative: Patient is a 75 y.o. female with PMHx of CKD stage V, PAD-s/p left AKA, PAF, chronic diastolic heart failure-presented to the hospital with cough, rhinorrhea-and confusion. She was found to have acute metabolic encephalopathy in the setting of COVID-19 pneumonia. See below for further details  COVID-19 vaccinated status:   Significant Events: 12/29>> [facility] to [facility] for COVID-19 pneumonia/metabolic encephalopathy  Significant studies: 12/29>> CT head: No evidence of acute intracranial abnormality. 12/29>>Chest x-ray: No active disease 12/30>> chest x-ray: Diffuse reticulonodular opacity-suspicious for COVID-19 pneumonia  COVID-19 medications: Steroids: 12/29>> Remdesivir: 12/29>>  Antibiotics: None  Microbiology data: 12/29 >>blood culture: No growth  Procedures: None  Consults: None  DVT prophylaxis: heparin injection 5,000 Units Start: 03/30/20 2200    Subjective:   In bed in no distress denies any headache chest or abdominal pain, feels good and wants to go home.   Assessment  & Plan :   Acute metabolic encephalopathy due to COVID-19 infection: Overall improved-suspect not far from usual baseline-at risk for delirium during this hospital stay-continue nightly Seroquel.  As noted in my prior notes-suspect she has some amount of vascular dementia at baseline.  Unable to get in touch with family for more collaborative  information-see below.   Covid 19 Viral pneumonia: Improved-on room air-continue steroid/Remdesivir-inflammatory markers downtrending.  Will complete Remdesivir on 1/2.  Fever: afebrile O2 requirements:  SpO2: 97 %   COVID-19 Labs: Recent Labs    04/01/20 0600 04/02/20 0505 04/03/20 0151  DDIMER 2.02*  --   --   FERRITIN 141  --   --   CRP 2.8* 2.0* 1.8*       Component Value Date/Time   BNP 1,490.5 (H) 03/02/2020 0850    Recent Labs  Lab 03/31/20 0636  PROCALCITON 0.17  Lab Results  Component Value Date   SARSCOV2NAA NEGATIVE 03/02/2020   SARSCOV2NAA NEGATIVE 02/12/2019   [place] NEGATIVE 02/06/2019     Prone/Incentive Spirometry: encouraged  incentive spirometry use 3-4/hour.\\  Stage V CKD: Creatinine close to baseline-Per prior documentation-she does not desire HD.  [place] metabolic acidosis: Probably secondary to CKD-continue oral bicarbonate 1300 mg twice daily.  Hypomagnesemia: Repleted  Normocytic anemia: Likely due to CKD-hemoglobin stable and close to baseline-follow  HTN: BP controlled-continue Norvasc/metoprolol.  HLD: Continue Lipitor  Chronic diastolic heart failure: Compensated-resume Lasix  History of moderate to severe pulmonary hypertension: Continue outpatient follow-up with cardiology  History of PAF: Reviewed prior cardiology consultation note November 2020-remote history of isolated episode A. fib-given issues with anemia-was never started on anticoagulation. On aspirin.  PAD-s/p left AKA  Deconditioning/debility/disposition: Patient with numerous medical problems-s/p left AKA-not sure of what her functional baseline is-evaluated by PT/OT-recommendations are for SNF unless family able to provide 24/7 care.  Have tried calling patient's daughter several times-over the past few days-I have not been able to contact her.  Per family they will take her home on 04/04/2020 and do not want SNF.       Obesity: Estimated body mass index  is 30.1 kg/m as calculated from the following:   Height as of this encounter: 5\' 8"  (1.727 m).   Weight as of this encounter: 89.8 kg.     Condition - Stable  Family Communication  :  Daughter Monique-[phone]- 04/03/20  Daughter-Rebecca Ronnald Ramp [phone] over the phone on 04/03/20  Code Status :  Full Code  Diet :  Diet Order            Diet Heart Room service appropriate? Yes; Fluid consistency: Thin  Diet effective now                  Disposition Plan  :   Status is: Inpatient  Remains inpatient appropriate because:Inpatient level of care appropriate due to severity of illness   Dispo: The patient is from: Home              Anticipated d/c is to: Home              Anticipated d/c date is: 1-2 days              Patient currently is not medically stable to d/c.  Barriers to discharge: Complete 5 days of IV Remdesivir-awaiting safe disposition-await social work evaluation.  Antimicorbials  :    Anti-infectives (From admission, onward)   Start     Dose/Rate Route Frequency Ordered Stop   03/31/20 1430  ceFEPIme (MAXIPIME) 2 g in sodium chloride 0.9 % 100 mL IVPB  Status:  Discontinued        2 g 200 mL/hr over 30 Minutes Intravenous Every 24 hours 03/30/20 1640 03/30/20 1800   03/31/20 1000  remdesivir 100 mg in sodium chloride 0.9 % 100 mL IVPB  Status:  Discontinued       "Followed by" Linked Group Details   100 mg 200 mL/hr over 30 Minutes Intravenous Daily 03/30/20 1800 04/03/20 1002   03/30/20 1800  remdesivir 200 mg in sodium chloride 0.9% 250 mL IVPB       "Followed by" Linked Group Details   200 mg 580 mL/hr over 30 Minutes Intravenous Once 03/30/20 1800 03/30/20 2040   03/30/20 1515  vancomycin (VANCOREADY) IVPB 1750 mg/350 mL        1,750 mg 175 mL/hr over 120 Minutes Intravenous  Once 03/30/20 1510 03/30/20 1815   03/30/20 1415  ceFEPIme (MAXIPIME) 2 g in sodium chloride 0.9 % 100 mL IVPB        2 g 200 mL/hr over 30 Minutes Intravenous  Once  03/30/20 1412 03/30/20 1613   03/30/20 1415  metroNIDAZOLE (FLAGYL) IVPB 500 mg        500 mg 100 mL/hr over 60 Minutes Intravenous  Once 03/30/20 1412 03/30/20 1613   03/30/20 1415  vancomycin (VANCOCIN) IVPB 1000 mg/200 mL premix  Status:  Discontinued        1,000 mg 200 mL/hr over 60 Minutes Intravenous  Once 03/30/20 1412 03/30/20 1417      Inpatient Medications  Scheduled Meds: . amLODipine  10 mg Oral Daily  . vitamin C  500 mg Oral Daily  . aspirin EC  81 mg Oral Daily  . atorvastatin  40 mg Oral q1800  . calcitRIOL  0.5 mcg Oral Daily  . docusate sodium  100 mg Oral BID  . furosemide  40 mg Oral Daily  . gabapentin  100 mg Oral Daily  . heparin injection (subcutaneous)  5,000 Units Subcutaneous Q8H  . metoprolol tartrate  50 mg Oral BID  . pantoprazole  40 mg Oral BID AC  . predniSONE  40 mg Oral Q breakfast  . QUEtiapine  25 mg Oral QHS  . sodium bicarbonate  1,300 mg Oral BID  . sodium chloride flush  3 mL Intravenous Q12H  . zinc sulfate  220 mg Oral Daily   Continuous Infusions:  PRN Meds:.acetaminophen, albuterol, bisacodyl, chlorpheniramine-HYDROcodone, guaiFENesin-dextromethorphan, haloperidol lactate, hydrALAZINE, loperamide, [DISCONTINUED] ondansetron **OR** ondansetron (ZOFRAN) IV, oxyCODONE, polyethylene glycol   Time Spent in minutes  25   See all Orders from today for further details   Lala Lund M.D on 04/03/2020 at 10:04 AM  To page go to [email or web address] - use universal password  [facility] -  Office  [phone]    Objective:   Vitals:   04/02/20 1609 04/02/20 2125 04/03/20 0524 04/03/20 0542  BP:  (!) 177/81 (!) 187/78 (!) 165/80  Pulse:  68 64   Resp:  18 19 15   Temp:  97.6 F (36.4 C) 97.8 F (36.6 C)   TempSrc:  Oral    SpO2: 92% 97% 97%   Weight:      Height:        Wt Readings from Last 3 Encounters:  03/30/20 89.8 kg  03/05/20 89.1 kg  02/18/19 70.2 kg     Intake/Output Summary (Last 24 hours) at 04/03/2020  1004 Last data filed at 04/03/2020 0536 Gross per 24 hour  Intake 120 ml  Output --  Net 120 ml     Physical Exam  Awake Alert, No new F.N deficits,   [place].AT,PERRAL Supple Neck,No JVD, No cervical lymphadenopathy appriciated.  Symmetrical Chest wall movement, Good air movement bilaterally, CTAB RRR,No Gallops, Rubs or new Murmurs, No Parasternal Heave +ve B.Sounds, Abd Soft, No tenderness, No organomegaly appriciated, No rebound - guarding or rigidity. No Cyanosis, L. AKA    Data Review:    CBC Recent Labs  Lab 03/30/20 1412 03/31/20 0631 04/01/20 0600 04/02/20 0505 04/03/20 0151  WBC 10.5 7.9 11.7* 9.9 13.8*  HGB 9.3* 8.6* 8.3* 8.5* 9.1*  HCT 31.3* 29.1* 27.3* 27.6* 27.9*  PLT 257 219 174 230 250  MCV 95.1 97.0 92.9 92.6 91.2  MCH 28.3 28.7 28.2 28.5 29.7  MCHC 29.7* 29.6* 30.4 30.8 32.6  RDW 15.5 15.5 15.4 15.6* 15.6*  LYMPHSABS 0.9 0.7 0.8 0.7 0.8  MONOABS 1.3* 0.2 1.0 0.4 1.3*  EOSABS 0.3 0.0 0.0 0.0 0.0  BASOSABS 0.1 0.0 0.0 0.0 0.1    Chemistries  Recent Labs  Lab 03/30/20 1412 03/31/20 0631 04/01/20 0600 04/02/20 0505 04/03/20 0151  NA 142 142 144 144 146*  K 4.0 3.6 3.5 4.1 4.0  CL 110 114* 114* 115* 115*  CO2 20* 14* 16* 16* 18*  GLUCOSE 93 122* 123* 128* 139*  BUN 45* 46* 51* 55* 57*  CREATININE 4.73* 4.64* 4.56* 4.57* 4.54*  CALCIUM 8.9 8.0* 8.4* 8.2* 8.4*  MG  --  1.6* 2.2  --   --   AST 28 32 40 47* 47*  ALT 15 17 19 18 22   ALKPHOS 135* 112 117 105 124  BILITOT 0.7 0.8 0.6 1.1 1.0   ------------------------------------------------------------------------------------------------------------------ No results for input(s): CHOL, HDL, LDLCALC, TRIG, CHOLHDL, LDLDIRECT in the last 72 hours.  Lab Results  Component Value Date   HGBA1C 5.3 12/29/2015   ------------------------------------------------------------------------------------------------------------------ No results for input(s): TSH, T4TOTAL, T3FREE, THYROIDAB in the last 72  hours.  Invalid input(s): FREET3 ------------------------------------------------------------------------------------------------------------------ Recent Labs    04/01/20 0600  FERRITIN 141    Coagulation profile Recent Labs  Lab 03/30/20 1412  INR 1.2    Recent Labs    04/01/20 0600  DDIMER 2.02*    Cardiac Enzymes No results for input(s): CKMB, TROPONINI, MYOGLOBIN in the last 168 hours.  Invalid input(s): CK ------------------------------------------------------------------------------------------------------------------    Component Value Date/Time   BNP 1,490.5 (H) 03/02/2020 0850    Micro Results Recent Results (from the past 240 hour(s))  Blood Culture (routine x 2)     Status: None (Preliminary result)   Collection Time: 03/30/20  2:00 PM   Specimen: BLOOD  Result Value Ref Range Status   Specimen Description BLOOD LEFT ANTECUBITAL  Final   Special Requests   Final    BOTTLES DRAWN AEROBIC AND ANAEROBIC Blood Culture adequate volume   Culture   Final    NO GROWTH 4 DAYS Performed at [facility], [address] [place], [place] [address]    Report Status PENDING  Incomplete  Blood Culture (routine x 2)     Status: None (Preliminary result)   Collection Time: 03/30/20  2:00 PM   Specimen: BLOOD  Result Value Ref Range Status   Specimen Description BLOOD RIGHT ANTECUBITAL  Final   Special Requests   Final    BOTTLES DRAWN AEROBIC AND ANAEROBIC Blood Culture adequate volume   Culture   Final    NO GROWTH 4 DAYS Performed at [facility], [place] [address], [place], [place] [address]    Report Status PENDING  Incomplete  Urine culture     Status: None   Collection Time: 03/30/20  3:22 PM   Specimen: In/Out Cath Urine  Result Value Ref Range Status   Specimen Description IN/OUT CATH URINE  Final   Special Requests NONE  Final   Culture   Final    NO GROWTH Performed at [facility], [place] [address] [place], [place] [address]     Report Status 03/31/2020 FINAL  Final    Radiology Reports CT Head Wo Contrast  Result Date: 03/30/2020 CLINICAL DATA:  Mental status change. EXAM: CT HEAD WITHOUT CONTRAST TECHNIQUE: Contiguous axial images were obtained from the base of the skull through the vertex without intravenous contrast. COMPARISON:  CT and MRI March 02, 2020. FINDINGS: Brain: No evidence  of acute large vascular territory infarction, hemorrhage, hydrocephalus, extra-axial collection or mass lesion/mass effect. Similar patchy white matter hypoattenuation, most likely related to chronic microvascular ischemic disease. Similar remote infarcts in the cerebellum, largest on the right. Vascular: Calcific atherosclerosis. No hyperdense vessel identified. Skull: No acute fracture. Sinuses/Orbits: Near complete opacification left maxillary sinus with internal hyperdensity and maxillary sinus wall thickening suggesting chronicity. Unremarkable orbits. Other: No mastoid effusions. IMPRESSION: No evidence of acute intracranial abnormality. 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 Left maxillary sinus disease. Internal hyperdensity may represent inspissated secretions and/or fungal colonization. Electronically Signed   By: Margaretha Sheffield MD   On: 03/30/2020 15:11   DG Chest Port 1 View  Result Date: 03/30/2020 CLINICAL DATA:  Sepsis. EXAM: PORTABLE CHEST 1 VIEW COMPARISON:  March 02, 2020. FINDINGS: Stable cardiomegaly. No pneumothorax or pleural effusion is noted. Both lungs are clear. The visualized skeletal structures are unremarkable. IMPRESSION: No active disease. Electronically Signed   By: Marijo Conception M.D.   On: 03/30/2020 14:29   DG Chest Port 1V same Day  Result Date: 03/31/2020 CLINICAL DATA:  75 year old female with shortness of breath. COVID-19. EXAM: PORTABLE CHEST 1 VIEW COMPARISON:  Portable chest 0222 hours today. Chest CT 03/02/2020 and earlier. FINDINGS:  Portable AP semi upright view at 0714 hours. Stable cardiomegaly and mediastinal contours. Visualized tracheal air column is within normal limits. Improved lung volumes from earlier today. Diffuse reticulonodular increased pulmonary opacity. No pneumothorax, pleural effusion or confluent opacity. No acute osseous abnormality identified. IMPRESSION: Mild diffuse reticulonodular opacity suspicious for COVID-19 pneumonia in this setting. Chronic cardiomegaly. No pleural effusion. Electronically Signed   By: Genevie Ann M.D.   On: 03/31/2020 07:41

## 2020-04-03 NOTE — TOC Progression Note (Addendum)
Transition of Care [facility]) - Progression Note    Patient Details  Name: Stephanie Griffith MRN: [id number] Date of Birth: 07/11/1945  Transition of Care [facility]) CM/SW [place], [place] Phone Number: 04/03/2020, 8:53 AM  Clinical Narrative:     CSW was contacted by pt's daughter Charelle Petrakis, [phone] or [phone].  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  Pt's daughter Reece Agar said that at discharge the pt will go to her son's house at [address] with home health.  Grandson's name is Bethany, [phone].    CSW explained that the hospital could not get in contact with relatives on the face sheet prior to Saturday..  Pt's daughter Reece Agar reports that the phone numbers work, but not inside the building that they work in.  Pt's daughter stated that if she can not be reached by phone call to please text her so she can respond.  TOC Team will continue to assist with disposition planning.  Expected Discharge Plan: Skilled Nursing Facility Barriers to Discharge: Continued Medical Work up,SNF Pending bed offer,Insurance Authorization  Expected Discharge Plan and Services Expected Discharge Plan: [place] In-house Referral: Clinical Social Work Discharge Planning Services: CM Consult   Living arrangements for the past 2 months: Single Family Home                                       Social Determinants of Health (SDOH) Interventions    Readmission Risk Interventions No flowsheet data found.

## 2020-04-04 DIAGNOSIS — G9349 Other encephalopathy: Secondary | ICD-10-CM | POA: Diagnosis not present

## 2020-04-04 DIAGNOSIS — U071 COVID-19: Secondary | ICD-10-CM | POA: Diagnosis not present

## 2020-04-04 LAB — CULTURE, BLOOD (ROUTINE X 2)
Culture: NO GROWTH
Culture: NO GROWTH
Special Requests: ADEQUATE
Special Requests: ADEQUATE

## 2020-04-04 LAB — BASIC METABOLIC PANEL
Anion gap: 10 (ref 5–15)
BUN: 58 mg/dL — ABNORMAL HIGH (ref 8–23)
CO2: 17 mmol/L — ABNORMAL LOW (ref 22–32)
Calcium: 8.3 mg/dL — ABNORMAL LOW (ref 8.9–10.3)
Chloride: 116 mmol/L — ABNORMAL HIGH (ref 98–111)
Creatinine, Ser: 4.53 mg/dL — ABNORMAL HIGH (ref 0.44–1.00)
GFR, Estimated: 10 mL/min — ABNORMAL LOW (ref >60)
Glucose, Bld: 122 mg/dL — ABNORMAL HIGH (ref 70–99)
Potassium: 4.5 mmol/L (ref 3.5–5.1)
Sodium: 143 mmol/L (ref 135–145)

## 2020-04-04 MED ORDER — MORPHINE SULFATE (PF) 2 MG/ML IV SOLN
0.5000 mg | Freq: Once | INTRAVENOUS | Status: AC
  Administered 2020-04-04: 0.5 mg via INTRAVENOUS
  Filled 2020-04-04: qty 1

## 2020-04-04 MED ORDER — METOPROLOL TARTRATE 100 MG PO TABS
100.0000 mg | ORAL_TABLET | Freq: Two times a day (BID) | ORAL | Status: DC
  Administered 2020-04-04: 100 mg via ORAL
  Filled 2020-04-04: qty 1

## 2020-04-04 MED ORDER — ALBUTEROL SULFATE HFA 108 (90 BASE) MCG/ACT IN AERS: 2.0000 | INHALATION_SPRAY | Freq: Four times a day (QID) | RESPIRATORY_TRACT | 0 refills | Status: AC | PRN

## 2020-04-04 MED ORDER — DILTIAZEM HCL 25 MG/5ML IV SOLN
10.0000 mg | Freq: Once | INTRAVENOUS | Status: AC
  Administered 2020-04-04: 10 mg via INTRAVENOUS
  Filled 2020-04-04: qty 5

## 2020-04-04 MED ORDER — METOPROLOL TARTRATE 100 MG PO TABS: 100.0000 mg | ORAL_TABLET | Freq: Two times a day (BID) | ORAL | 0 refills | Status: AC

## 2020-04-04 MED ORDER — METOPROLOL TARTRATE 5 MG/5ML IV SOLN: 5.0000 mg | Freq: Three times a day (TID) | INTRAVENOUS | Status: DC | PRN

## 2020-04-04 NOTE — Discharge Instructions (Signed)
Follow with Primary MD [name], MD in 7 days   Get CBC, CMP, 2 view Chest X ray -  checked next visit within 1 week by Primary MD    Activity: As tolerated with Full fall precautions use walker/cane & assistance as needed  Disposition Home    Diet: Renal diet   Special Instructions: If you have smoked or chewed Tobacco  in the last 2 yrs please stop smoking, stop any regular Alcohol  and or any Recreational drug use.  On your next visit with your primary care physician please Get Medicines reviewed and adjusted.  Please request your Prim.MD to go over all Hospital Tests and Procedure/Radiological results at the follow up, please get all Hospital records sent to your Prim MD by signing hospital release before you go home.  If you experience worsening of your admission symptoms, develop shortness of breath, life threatening emergency, suicidal or homicidal thoughts you must seek medical attention immediately by calling 911 or calling your MD immediately  if symptoms less severe.  You Must read complete instructions/literature along with all the possible adverse reactions/side effects for all the Medicines you take and that have been prescribed to you. Take any new Medicines after you have completely understood and accpet all the possible adverse reactions/side effects.      Person Under Monitoring Name: Stephanie Griffith  Location: [place] [address]   Infection Prevention Recommendations for Individuals Confirmed to have, or Being Evaluated for, 2019 Novel Coronavirus (COVID-19) Infection Who Receive Care at Home  Individuals who are confirmed to have, or are being evaluated for, COVID-19 should follow the prevention steps below until a healthcare provider or local or state health department says they can return to normal activities.  Stay home except to get medical care You should restrict activities outside your home, except for getting medical care. Do  not go to work, school, or public areas, and do not use public transportation or taxis.  Call ahead before visiting your doctor Before your medical appointment, call the healthcare provider and tell them that you have, or are being evaluated for, COVID-19 infection. This will help the healthcare providers office take steps to keep other people from getting infected. Ask your healthcare provider to call the local or state health department.  Monitor your symptoms Seek prompt medical attention if your illness is worsening (e.g., difficulty breathing). Before going to your medical appointment, call the healthcare provider and tell them that you have, or are being evaluated for, COVID-19 infection. Ask your healthcare provider to call the local or state health department.  Wear a facemask You should wear a facemask that covers your nose and mouth when you are in the same room with other people and when you visit a healthcare provider. People who live with or visit you should also wear a facemask while they are in the same room with you.  Separate yourself from other people in your home As much as possible, you should stay in a different room from other people in your home. Also, you should use a separate bathroom, if available.  Avoid sharing household items You should not share dishes, drinking glasses, cups, eating utensils, towels, bedding, or other items with other people in your home. After using these items, you should wash them thoroughly with soap and water.  Cover your coughs and sneezes Cover your mouth and nose with a tissue when you cough or sneeze, or you can cough or sneeze  into your sleeve. Throw used tissues in a lined trash can, and immediately wash your hands with soap and water for at least 20 seconds or use an alcohol-based hand rub.  Wash your [facility] your hands often and thoroughly with soap and water for at least 20 seconds. You can use an alcohol-based  hand sanitizer if soap and water are not available and if your hands are not visibly dirty. Avoid touching your eyes, nose, and mouth with unwashed hands.   Prevention Steps for Caregivers and Household Members of Individuals Confirmed to have, or Being Evaluated for, COVID-19 Infection Being Cared for in the Home  If you live with, or provide care at home for, a person confirmed to have, or being evaluated for, COVID-19 infection please follow these guidelines to prevent infection:  Follow healthcare providers instructions Make sure that you understand and can help the patient follow any healthcare provider instructions for all care.  Provide for the patients basic needs You should help the patient with basic needs in the home and provide support for getting groceries, prescriptions, and other personal needs.  Monitor the patients symptoms If they are getting sicker, call his or her medical provider and tell them that the patient has, or is being evaluated for, COVID-19 infection. This will help the healthcare providers office take steps to keep other people from getting infected. Ask the healthcare provider to call the local or state health department.  Limit the number of people who have contact with the patient  If possible, have only one caregiver for the patient.  Other household members should stay in another home or place of residence. If this is not possible, they should stay  in another room, or be separated from the patient as much as possible. Use a separate bathroom, if available.  Restrict visitors who do not have an essential need to be in the home.  Keep older adults, very young children, and other sick people away from the patient Keep older adults, very young children, and those who have compromised immune systems or chronic health conditions away from the patient. This includes people with chronic heart, lung, or kidney conditions, diabetes, and  cancer.  Ensure good ventilation Make sure that shared spaces in the home have good air flow, such as from an air conditioner or an opened window, weather permitting.  Wash your hands often  Wash your hands often and thoroughly with soap and water for at least 20 seconds. You can use an alcohol based hand sanitizer if soap and water are not available and if your hands are not visibly dirty.  Avoid touching your eyes, nose, and mouth with unwashed hands.  Use disposable paper towels to dry your hands. If not available, use dedicated cloth towels and replace them when they become wet.  Wear a facemask and gloves  Wear a disposable facemask at all times in the room and gloves when you touch or have contact with the patients blood, body fluids, and/or secretions or excretions, such as sweat, saliva, sputum, nasal mucus, vomit, urine, or feces.  Ensure the mask fits over your nose and mouth tightly, and do not touch it during use.  Throw out disposable facemasks and gloves after using them. Do not reuse.  Wash your hands immediately after removing your facemask and gloves.  If your personal clothing becomes contaminated, carefully remove clothing and launder. Wash your hands after handling contaminated clothing.  Place all used disposable facemasks, gloves, and other waste in  a lined container before disposing them with other household waste.  Remove gloves and wash your hands immediately after handling these items.  Do not share dishes, glasses, or other household items with the patient  Avoid sharing household items. You should not share dishes, drinking glasses, cups, eating utensils, towels, bedding, or other items with a patient who is confirmed to have, or being evaluated for, COVID-19 infection.  After the person uses these items, you should wash them thoroughly with soap and water.  Wash laundry thoroughly  Immediately remove and wash clothes or bedding that have blood, body  fluids, and/or secretions or excretions, such as sweat, saliva, sputum, nasal mucus, vomit, urine, or feces, on them.  Wear gloves when handling laundry from the patient.  Read and follow directions on labels of laundry or clothing items and detergent. In general, wash and dry with the warmest temperatures recommended on the label.  Clean all areas the individual has used often  Clean all touchable surfaces, such as counters, tabletops, doorknobs, bathroom fixtures, toilets, phones, keyboards, tablets, and bedside tables, every day. Also, clean any surfaces that may have blood, body fluids, and/or secretions or excretions on them.  Wear gloves when cleaning surfaces the patient has come in contact with.  Use a diluted bleach solution (e.g., dilute bleach with 1 part bleach and 10 parts water) or a household disinfectant with a label that says EPA-registered for coronaviruses. To make a bleach solution at home, add 1 tablespoon of bleach to 1 quart (4 cups) of water. For a larger supply, add  cup of bleach to 1 gallon (16 cups) of water.  Read labels of cleaning products and follow recommendations provided on product labels. Labels contain instructions for safe and effective use of the cleaning product including precautions you should take when applying the product, such as wearing gloves or eye protection and making sure you have good ventilation during use of the product.  Remove gloves and wash hands immediately after cleaning.  Monitor yourself for signs and symptoms of illness Caregivers and household members are considered close contacts, should monitor their health, and will be asked to limit movement outside of the home to the extent possible. Follow the monitoring steps for close contacts listed on the symptom monitoring form.   ? If you have additional questions, contact your local health department or call the epidemiologist on call at [phone] (available 24/7). ? This  guidance is subject to change. For the most up-to-date guidance from [facility], please refer to their website: [email or web address]

## 2020-04-04 NOTE — Discharge Summary (Addendum)
Stephanie Griffith LKT:[id number] DOB: Oct 07, 1945 DOA: 03/30/2020  PCP: [name], MD  Admit date: 03/30/2020  Discharge date: 04/04/2020  Admitted From: Home   Disposition:  Home ( family refused SNF)   Recommendations for Outpatient Follow-up:   Follow up with PCP in 1-2 weeks  PCP Please obtain BMP/CBC, 2 view CXR in 1week,  (see Discharge instructions)   PCP Please follow up on the following pending results:    Home Health: PT, RN   Equipment/Devices: Bed, 3in1  Consultations: None  Discharge Condition: Stable    CODE STATUS: Full    Diet Recommendation: Renal  Diet Order            Diet Heart Room service appropriate? Yes; Fluid consistency: Thin  Diet effective now                  Chief Complaint  Patient presents with  . Code Sepsis     Brief history of present illness from the day of admission and additional interim summary     Patient is a 75 y.o. female with PMHx of CKD stage V, PAD-s/p left AKA, PAF, chronic diastolic heart failure-presented to the hospital with cough, rhinorrhea-and confusion. She was found to have acute metabolic encephalopathy in the setting of COVID-19 pneumonia. See below for further details  COVID-19 vaccinated status:   Significant Events: 12/29>> Admit to [facility] for COVID-19 pneumonia/metabolic encephalopathy  Significant studies: 12/29>> CT head: No evidence of acute intracranial abnormality. 12/29>>Chest x-ray: No active disease 12/30>> chest x-ray: Diffuse reticulonodular opacity-suspicious for COVID-19 pneumonia  COVID-19 medications: Steroids: 12/29>> Remdesivir: 12/29>>                                                                 Hospital Course   Acute metabolic encephalopathy due to COVID-19 infection: Overall improved-suspect not far from  usual baseline-at risk for delirium during this hospital stay- improved on low dose QHS Seroquel.  As noted in my prior notes-suspect she has some amount of vascular dementia at baseline.  2 status has improved, likely will get better when she is back to her home in familiar settings, no focal deficits..   Covid 19 Viral pneumonia: Improved-on room air-has finished her course of steroid/Remdesivir-inflammatory markers downtrending.    Symptom-free on room air.    Sepsis was ruled out.   SpO2: 98 %  Recent Labs  Lab 03/30/20 1412 03/30/20 1512 03/31/20 0631 03/31/20 0636 04/01/20 0600 04/02/20 0505 04/03/20 0151  WBC 10.5  --  7.9  --  11.7* 9.9 13.8*  CRP  --   --  3.0*  --  2.8* 2.0* 1.8*  DDIMER  --   --  2.67*  --  2.02*  --   --   PROCALCITON  --   --   --  0.17  --   --   --   LATICACIDVEN  --  1.9  --   --   --   --   --   AST 28  --  32  --  40 47* 47*  ALT 15  --  17  --  19 18 22   ALKPHOS 135*  --  112  --  117 105 124  BILITOT 0.7  --  0.8  --  0.6 1.1 1.0  ALBUMIN 2.9*  --  2.5*  --  2.6* 2.5* 2.7*  INR 1.2  --   --   --   --   --   --         Stage V CKD: Creatinine close to baseline-Per prior documentation-she does not desire HD.  [place] metabolic acidosis: Probably secondary to CKD-continue oral bicarbonate 1300 mg twice daily.  Hypomagnesemia: Repleted  Normocytic anemia: Likely due to CKD-hemoglobin stable and close to baseline-follow  HTN:  On Norvasc and beta-blocker, beta-blocker dose increased for better control.  HLD: Continue Lipitor  Chronic diastolic heart failure: Compensated-resume Lasix home dose.  History of moderate to severe pulmonary hypertension: Continue outpatient follow-up with cardiology  History of PAF: Reviewed prior cardiology consultation note November 2020-remote history of isolated episode A. fib-given issues with anemia-was never started on anticoagulation. On aspirin.  PAD-s/p left  AKA  Deconditioning/debility/disposition: Patient with numerous medical problems-s/p left AKA-not sure of what her functional baseline is-evaluated by PT/OT-recommendations are for SNF unless family able to provide 24/7 care.  Have tried calling patient's daughter several times-over the past few days-I have not been able to contact her.  Per family they will take her home on 04/04/2020 and do not want SNF.   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, stage 5 (HCC)   S/P AKA (above knee amputation) (HCC)   PVD (peripheral vascular disease) with claudication (HCC)   Chronic diastolic CHF (congestive heart failure) ([place])   Dyslipidemia    Discharge instructions    Discharge Instructions    Discharge instructions   Complete by: As directed    Follow with Primary MD [name], MD in 7 days   Get CBC, CMP, 2 view Chest X ray -  checked next visit within 1 week by Primary MD    Activity: As tolerated with Full fall precautions use walker/cane & assistance as needed  Disposition Home    Diet: Renal diet   Special Instructions: If you have smoked or chewed Tobacco  in the last 2 yrs please stop smoking, stop any regular Alcohol  and or any Recreational drug use.  On your next visit with your primary care physician please Get Medicines reviewed and adjusted.  Please request your Prim.MD to go over all Hospital Tests and Procedure/Radiological results at the follow up, please get all Hospital records sent to your Prim MD by signing hospital release before you go home.  If you experience worsening of your admission symptoms, develop shortness of breath, life threatening emergency, suicidal or homicidal thoughts you must seek medical attention immediately by calling 911 or calling your MD immediately  if symptoms less severe.  You Must read  complete instructions/literature along with all the possible adverse reactions/side effects for all the Medicines you take and that have been prescribed to you. Take any new Medicines after you have  completely understood and accpet all the possible adverse reactions/side effects.   Increase activity slowly   Complete by: As directed       Discharge Medications   Allergies as of 04/04/2020      Reactions   Morphine And Related Other (See Comments)   AMS      Medication List    TAKE these medications   albuterol 108 (90 Base) MCG/ACT inhaler Commonly known as: VENTOLIN HFA Inhale 2 puffs into the lungs every 6 (six) hours as needed for wheezing or shortness of breath.   amLODipine 10 MG tablet Commonly known as: NORVASC Take 10 mg by mouth daily.   aspirin 81 MG EC tablet Take 81 mg by mouth daily.   atorvastatin 40 MG tablet Commonly known as: LIPITOR Take 1 tablet (40 mg total) by mouth daily at 6 PM. What changed: when to take this   calcitRIOL 0.5 MCG capsule Commonly known as: ROCALTROL Take 0.5 mcg by mouth daily.   furosemide 40 MG tablet Commonly known as: LASIX Take 1 tablet (40 mg total) by mouth daily.   gabapentin 100 MG capsule Commonly known as: NEURONTIN Take 100 mg by mouth daily.   metoprolol tartrate 100 MG tablet Commonly known as: LOPRESSOR Take 1 tablet (100 mg total) by mouth 2 (two) times daily. What changed:   medication strength  how much to take   multivitamin with minerals Tabs tablet Take 1 tablet by mouth daily.   sodium bicarbonate 650 MG tablet Take 650 mg by mouth in the morning, at noon, and at bedtime.            Durable Medical Equipment  (From admission, onward)         Start     Ordered   04/03/20 1004  For home use only DME Other see comment  Once       Comments: 3 in 1, hospital bed  Question:  Length of Need  Answer:  6 Months   04/03/20 1003           Follow-up Information    [name], MD.  Schedule an appointment as soon as possible for a visit in 1 week(s).   Specialty: Internal Medicine Contact information: [address] [place] [address] [phone]        Minus Breeding, MD .   Specialty: Cardiology Contact information: [address] [place] [address] [phone]               Major procedures and Radiology Reports - PLEASE review detailed and final reports thoroughly  -        CT Head Wo Contrast  Result Date: 03/30/2020 CLINICAL DATA:  Mental status change. EXAM: CT HEAD WITHOUT CONTRAST TECHNIQUE: Contiguous axial images were obtained from the base of the skull through the vertex without intravenous contrast. COMPARISON:  CT and MRI March 02, 2020. FINDINGS: Brain: No evidence of acute large vascular territory infarction, hemorrhage, hydrocephalus, extra-axial collection or mass lesion/mass effect. Similar patchy white matter hypoattenuation, most likely related to chronic microvascular ischemic disease. Similar remote infarcts in the cerebellum, largest on the right. Vascular: Calcific atherosclerosis. No hyperdense vessel identified. Skull: No acute fracture. Sinuses/Orbits: Near complete opacification left maxillary sinus with internal hyperdensity and maxillary sinus wall thickening suggesting chronicity. Unremarkable orbits. Other: No mastoid effusions. IMPRESSION: No evidence of acute intracranial abnormality. 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 Left maxillary sinus disease. Internal hyperdensity  may represent inspissated secretions and/or fungal colonization. Electronically Signed   By: Margaretha Sheffield MD   On: 03/30/2020 15:11   DG Chest Port 1 View  Result Date: 03/30/2020 CLINICAL DATA:  Sepsis. EXAM: PORTABLE CHEST 1 VIEW COMPARISON:  March 02, 2020. FINDINGS: Stable cardiomegaly. No pneumothorax or pleural effusion is noted. Both lungs are clear.  The visualized skeletal structures are unremarkable. IMPRESSION: No active disease. Electronically Signed   By: Marijo Conception M.D.   On: 03/30/2020 14:29   DG Chest Port 1V same Day  Result Date: 03/31/2020 CLINICAL DATA:  75 year old female with shortness of breath. COVID-19. EXAM: PORTABLE CHEST 1 VIEW COMPARISON:  Portable chest 0222 hours today. Chest CT 03/02/2020 and earlier. FINDINGS: Portable AP semi upright view at 0714 hours. Stable cardiomegaly and mediastinal contours. Visualized tracheal air column is within normal limits. Improved lung volumes from earlier today. Diffuse reticulonodular increased pulmonary opacity. No pneumothorax, pleural effusion or confluent opacity. No acute osseous abnormality identified. IMPRESSION: Mild diffuse reticulonodular opacity suspicious for COVID-19 pneumonia in this setting. Chronic cardiomegaly. No pleural effusion. Electronically Signed   By: Genevie Ann M.D.   On: 03/31/2020 07:41    Micro Results     Recent Results (from the past 240 hour(s))  Blood Culture (routine x 2)     Status: None (Preliminary result)   Collection Time: 03/30/20  2:00 PM   Specimen: BLOOD  Result Value Ref Range Status   Specimen Description BLOOD LEFT ANTECUBITAL  Final   Special Requests   Final    BOTTLES DRAWN AEROBIC AND ANAEROBIC Blood Culture adequate volume   Culture   Final    NO GROWTH 4 DAYS Performed at [facility], [address] [place], [place] [address]    Report Status PENDING  Incomplete  Blood Culture (routine x 2)     Status: None (Preliminary result)   Collection Time: 03/30/20  2:00 PM   Specimen: BLOOD  Result Value Ref Range Status   Specimen Description BLOOD RIGHT ANTECUBITAL  Final   Special Requests   Final    BOTTLES DRAWN AEROBIC AND ANAEROBIC Blood Culture adequate volume   Culture   Final    NO GROWTH 4 DAYS Performed at [facility], [place] [address], [place], [place] [address]    Report Status PENDING  Incomplete   Urine culture     Status: None   Collection Time: 03/30/20  3:22 PM   Specimen: In/Out Cath Urine  Result Value Ref Range Status   Specimen Description IN/OUT CATH URINE  Final   Special Requests NONE  Final   Culture   Final    NO GROWTH Performed at [facility], [place] [address] [place], [place] [address]    Report Status 03/31/2020 FINAL  Final    Today   Subjective    Stephanie Griffith today has no headache,no chest abdominal pain,no new weakness tingling or numbness, feels much better wants to go home today.    Objective   Blood pressure 145/90, pulse 85, temperature 97.6 F (36.4 C), temperature source Axillary, resp. rate 14, height 5\' 8"  (1.727 m), weight 89.8 kg, SpO2 98 %.   Intake/Output Summary (Last 24 hours) at 04/04/2020 0935 Last data filed at 04/03/2020 1757 Gross per 24 hour  Intake 0 ml  Output 600 ml  Net -600 ml    Exam  Sleeping, woken up, no focal deficits, [place].AT,PERRAL Supple Neck,No JVD, No cervical lymphadenopathy appriciated.  Symmetrical Chest wall movement, Good air movement bilaterally, CTAB RRR,No Gallops,Rubs or new Murmurs, No Parasternal Heave +ve B.Sounds, Abd Soft, Non tender, No organomegaly appriciated, No rebound -guarding or rigidity. No Cyanosis, left AKA   Data Review   CBC w Diff:  Lab Results  Component Value Date   WBC 13.8 (H) 04/03/2020   HGB 9.1 (L) 04/03/2020   HCT 27.9 (L) 04/03/2020   HCT 23.1 (L) 02/08/2019   PLT 250 04/03/2020   LYMPHOPCT 6 04/03/2020   MONOPCT 9 04/03/2020   EOSPCT 0 04/03/2020   BASOPCT 0 04/03/2020    CMP:  Lab Results  Component Value Date   NA 143 04/04/2020   K 4.5 04/04/2020   CL 116 (H) 04/04/2020   CO2 17 (L) 04/04/2020   BUN 58 (H) 04/04/2020   CREATININE 4.53 (H) 04/04/2020   PROT 7.3 04/03/2020   ALBUMIN 2.7 (L) 04/03/2020   BILITOT 1.0 04/03/2020   ALKPHOS 124 04/03/2020   AST 47 (H) 04/03/2020   ALT 22 04/03/2020  .   Total Time in preparing paper work, data  evaluation and todays exam - 76 minutes  Lala Lund M.D on 04/04/2020 at 9:35 AM  [facility]

## 2020-04-07 ENCOUNTER — Emergency Department (HOSPITAL_COMMUNITY): Payer: Medicare Other

## 2020-04-07 ENCOUNTER — Other Ambulatory Visit: Payer: Self-pay

## 2020-04-07 ENCOUNTER — Observation Stay (HOSPITAL_COMMUNITY): Payer: Medicare Other

## 2020-04-07 ENCOUNTER — Inpatient Hospital Stay (HOSPITAL_COMMUNITY)
Admission: EM | Admit: 2020-04-07 | Discharge: 2020-05-03 | DRG: 070 | Disposition: E | Payer: Medicare Other | Attending: Pulmonary Disease | Admitting: Pulmonary Disease

## 2020-04-07 ENCOUNTER — Encounter (HOSPITAL_COMMUNITY): Payer: Self-pay | Admitting: Emergency Medicine

## 2020-04-07 DIAGNOSIS — N179 Acute kidney failure, unspecified: Secondary | ICD-10-CM | POA: Diagnosis not present

## 2020-04-07 DIAGNOSIS — J9690 Respiratory failure, unspecified, unspecified whether with hypoxia or hypercapnia: Secondary | ICD-10-CM

## 2020-04-07 DIAGNOSIS — Z89619 Acquired absence of unspecified leg above knee: Secondary | ICD-10-CM

## 2020-04-07 DIAGNOSIS — I272 Pulmonary hypertension, unspecified: Secondary | ICD-10-CM | POA: Diagnosis present

## 2020-04-07 DIAGNOSIS — E8809 Other disorders of plasma-protein metabolism, not elsewhere classified: Secondary | ICD-10-CM | POA: Diagnosis present

## 2020-04-07 DIAGNOSIS — Z79899 Other long term (current) drug therapy: Secondary | ICD-10-CM

## 2020-04-07 DIAGNOSIS — I69328 Other speech and language deficits following cerebral infarction: Secondary | ICD-10-CM

## 2020-04-07 DIAGNOSIS — I739 Peripheral vascular disease, unspecified: Secondary | ICD-10-CM

## 2020-04-07 DIAGNOSIS — R140 Abdominal distension (gaseous): Secondary | ICD-10-CM

## 2020-04-07 DIAGNOSIS — N184 Chronic kidney disease, stage 4 (severe): Secondary | ICD-10-CM

## 2020-04-07 DIAGNOSIS — I4891 Unspecified atrial fibrillation: Secondary | ICD-10-CM

## 2020-04-07 DIAGNOSIS — D649 Anemia, unspecified: Secondary | ICD-10-CM

## 2020-04-07 DIAGNOSIS — G9340 Encephalopathy, unspecified: Secondary | ICD-10-CM | POA: Diagnosis not present

## 2020-04-07 DIAGNOSIS — I469 Cardiac arrest, cause unspecified: Secondary | ICD-10-CM

## 2020-04-07 DIAGNOSIS — R4182 Altered mental status, unspecified: Secondary | ICD-10-CM

## 2020-04-07 DIAGNOSIS — Z993 Dependence on wheelchair: Secondary | ICD-10-CM

## 2020-04-07 DIAGNOSIS — G9341 Metabolic encephalopathy: Principal | ICD-10-CM | POA: Diagnosis present

## 2020-04-07 DIAGNOSIS — R627 Adult failure to thrive: Secondary | ICD-10-CM | POA: Diagnosis present

## 2020-04-07 DIAGNOSIS — Z7189 Other specified counseling: Secondary | ICD-10-CM

## 2020-04-07 DIAGNOSIS — U071 COVID-19: Secondary | ICD-10-CM

## 2020-04-07 DIAGNOSIS — I132 Hypertensive heart and chronic kidney disease with heart failure and with stage 5 chronic kidney disease, or end stage renal disease: Secondary | ICD-10-CM | POA: Diagnosis present

## 2020-04-07 DIAGNOSIS — Z452 Encounter for adjustment and management of vascular access device: Secondary | ICD-10-CM

## 2020-04-07 DIAGNOSIS — I5032 Chronic diastolic (congestive) heart failure: Secondary | ICD-10-CM

## 2020-04-07 DIAGNOSIS — Z992 Dependence on renal dialysis: Secondary | ICD-10-CM

## 2020-04-07 DIAGNOSIS — R0603 Acute respiratory distress: Secondary | ICD-10-CM

## 2020-04-07 DIAGNOSIS — I468 Cardiac arrest due to other underlying condition: Secondary | ICD-10-CM | POA: Diagnosis not present

## 2020-04-07 DIAGNOSIS — E876 Hypokalemia: Secondary | ICD-10-CM | POA: Diagnosis present

## 2020-04-07 DIAGNOSIS — R001 Bradycardia, unspecified: Secondary | ICD-10-CM | POA: Diagnosis not present

## 2020-04-07 DIAGNOSIS — I480 Paroxysmal atrial fibrillation: Secondary | ICD-10-CM | POA: Diagnosis present

## 2020-04-07 DIAGNOSIS — Z8616 Personal history of COVID-19: Secondary | ICD-10-CM

## 2020-04-07 DIAGNOSIS — E785 Hyperlipidemia, unspecified: Secondary | ICD-10-CM

## 2020-04-07 DIAGNOSIS — I4510 Unspecified right bundle-branch block: Secondary | ICD-10-CM | POA: Diagnosis present

## 2020-04-07 DIAGNOSIS — N390 Urinary tract infection, site not specified: Secondary | ICD-10-CM | POA: Diagnosis present

## 2020-04-07 DIAGNOSIS — Z89612 Acquired absence of left leg above knee: Secondary | ICD-10-CM

## 2020-04-07 DIAGNOSIS — Z885 Allergy status to narcotic agent status: Secondary | ICD-10-CM

## 2020-04-07 DIAGNOSIS — J449 Chronic obstructive pulmonary disease, unspecified: Secondary | ICD-10-CM | POA: Diagnosis present

## 2020-04-07 DIAGNOSIS — E872 Acidosis, unspecified: Secondary | ICD-10-CM | POA: Diagnosis present

## 2020-04-07 DIAGNOSIS — E875 Hyperkalemia: Secondary | ICD-10-CM | POA: Diagnosis not present

## 2020-04-07 DIAGNOSIS — A419 Sepsis, unspecified organism: Secondary | ICD-10-CM | POA: Diagnosis not present

## 2020-04-07 DIAGNOSIS — R7989 Other specified abnormal findings of blood chemistry: Secondary | ICD-10-CM | POA: Diagnosis present

## 2020-04-07 DIAGNOSIS — E870 Hyperosmolality and hypernatremia: Secondary | ICD-10-CM | POA: Diagnosis present

## 2020-04-07 DIAGNOSIS — E669 Obesity, unspecified: Secondary | ICD-10-CM | POA: Diagnosis present

## 2020-04-07 DIAGNOSIS — N186 End stage renal disease: Secondary | ICD-10-CM | POA: Diagnosis present

## 2020-04-07 DIAGNOSIS — R570 Cardiogenic shock: Secondary | ICD-10-CM | POA: Diagnosis not present

## 2020-04-07 DIAGNOSIS — Z833 Family history of diabetes mellitus: Secondary | ICD-10-CM

## 2020-04-07 DIAGNOSIS — R6521 Severe sepsis with septic shock: Secondary | ICD-10-CM | POA: Diagnosis not present

## 2020-04-07 DIAGNOSIS — E7800 Pure hypercholesterolemia, unspecified: Secondary | ICD-10-CM | POA: Diagnosis present

## 2020-04-07 DIAGNOSIS — F0150 Vascular dementia without behavioral disturbance: Secondary | ICD-10-CM | POA: Diagnosis present

## 2020-04-07 DIAGNOSIS — D72828 Other elevated white blood cell count: Secondary | ICD-10-CM

## 2020-04-07 DIAGNOSIS — E860 Dehydration: Secondary | ICD-10-CM | POA: Diagnosis present

## 2020-04-07 DIAGNOSIS — I252 Old myocardial infarction: Secondary | ICD-10-CM

## 2020-04-07 DIAGNOSIS — E874 Mixed disorder of acid-base balance: Secondary | ICD-10-CM | POA: Diagnosis present

## 2020-04-07 DIAGNOSIS — Z87891 Personal history of nicotine dependence: Secondary | ICD-10-CM

## 2020-04-07 DIAGNOSIS — Z7982 Long term (current) use of aspirin: Secondary | ICD-10-CM

## 2020-04-07 DIAGNOSIS — R1310 Dysphagia, unspecified: Secondary | ICD-10-CM | POA: Diagnosis present

## 2020-04-07 DIAGNOSIS — E162 Hypoglycemia, unspecified: Secondary | ICD-10-CM | POA: Diagnosis not present

## 2020-04-07 DIAGNOSIS — J690 Pneumonitis due to inhalation of food and vomit: Secondary | ICD-10-CM | POA: Diagnosis not present

## 2020-04-07 DIAGNOSIS — G9349 Other encephalopathy: Secondary | ICD-10-CM

## 2020-04-07 DIAGNOSIS — I4892 Unspecified atrial flutter: Secondary | ICD-10-CM | POA: Diagnosis present

## 2020-04-07 DIAGNOSIS — D631 Anemia in chronic kidney disease: Secondary | ICD-10-CM | POA: Diagnosis present

## 2020-04-07 DIAGNOSIS — Z95828 Presence of other vascular implants and grafts: Secondary | ICD-10-CM

## 2020-04-07 DIAGNOSIS — Z6833 Body mass index (BMI) 33.0-33.9, adult: Secondary | ICD-10-CM

## 2020-04-07 DIAGNOSIS — I878 Other specified disorders of veins: Secondary | ICD-10-CM | POA: Diagnosis present

## 2020-04-07 DIAGNOSIS — Z66 Do not resuscitate: Secondary | ICD-10-CM | POA: Diagnosis not present

## 2020-04-07 DIAGNOSIS — R0600 Dyspnea, unspecified: Secondary | ICD-10-CM

## 2020-04-07 DIAGNOSIS — K5900 Constipation, unspecified: Secondary | ICD-10-CM | POA: Diagnosis present

## 2020-04-07 DIAGNOSIS — D72829 Elevated white blood cell count, unspecified: Secondary | ICD-10-CM | POA: Diagnosis present

## 2020-04-07 DIAGNOSIS — J9621 Acute and chronic respiratory failure with hypoxia: Secondary | ICD-10-CM | POA: Diagnosis not present

## 2020-04-07 DIAGNOSIS — E861 Hypovolemia: Secondary | ICD-10-CM | POA: Diagnosis present

## 2020-04-07 DIAGNOSIS — Z8249 Family history of ischemic heart disease and other diseases of the circulatory system: Secondary | ICD-10-CM

## 2020-04-07 LAB — URINALYSIS, COMPLETE (UACMP) WITH MICROSCOPIC
Bacteria, UA: NONE SEEN
Bilirubin Urine: NEGATIVE
Glucose, UA: NEGATIVE mg/dL
Hgb urine dipstick: NEGATIVE
Ketones, ur: NEGATIVE mg/dL
Leukocytes,Ua: NEGATIVE
Nitrite: NEGATIVE
Protein, ur: 100 mg/dL — AB
Specific Gravity, Urine: 1.015 (ref 1.005–1.030)
pH: 5 (ref 5.0–8.0)

## 2020-04-07 LAB — I-STAT CHEM 8, ED
BUN: 64 mg/dL — ABNORMAL HIGH (ref 8–23)
Calcium, Ion: 1.06 mmol/L — ABNORMAL LOW (ref 1.15–1.40)
Chloride: 117 mmol/L — ABNORMAL HIGH (ref 98–111)
Creatinine, Ser: 4.6 mg/dL — ABNORMAL HIGH (ref 0.44–1.00)
Glucose, Bld: 85 mg/dL (ref 70–99)
HCT: 31 % — ABNORMAL LOW (ref 36.0–46.0)
Hemoglobin: 10.5 g/dL — ABNORMAL LOW (ref 12.0–15.0)
Potassium: 4.4 mmol/L (ref 3.5–5.1)
Sodium: 148 mmol/L — ABNORMAL HIGH (ref 135–145)
TCO2: 19 mmol/L — ABNORMAL LOW (ref 22–32)

## 2020-04-07 LAB — CBC WITH DIFFERENTIAL/PLATELET
Abs Immature Granulocytes: 0.6 10*3/uL — ABNORMAL HIGH (ref 0.00–0.07)
Basophils Absolute: 0.1 10*3/uL (ref 0.0–0.1)
Basophils Relative: 1 %
Eosinophils Absolute: 0 10*3/uL (ref 0.0–0.5)
Eosinophils Relative: 0 %
HCT: 34.3 % — ABNORMAL LOW (ref 36.0–46.0)
Hemoglobin: 10.5 g/dL — ABNORMAL LOW (ref 12.0–15.0)
Immature Granulocytes: 4 %
Lymphocytes Relative: 11 %
Lymphs Abs: 1.4 10*3/uL (ref 0.7–4.0)
MCH: 29.6 pg (ref 26.0–34.0)
MCHC: 30.6 g/dL (ref 30.0–36.0)
MCV: 96.6 fL (ref 80.0–100.0)
Monocytes Absolute: 1.7 10*3/uL — ABNORMAL HIGH (ref 0.1–1.0)
Monocytes Relative: 13 %
Neutro Abs: 9.7 10*3/uL — ABNORMAL HIGH (ref 1.7–7.7)
Neutrophils Relative %: 71 %
Platelets: 208 10*3/uL (ref 150–400)
RBC: 3.55 MIL/uL — ABNORMAL LOW (ref 3.87–5.11)
RDW: 17 % — ABNORMAL HIGH (ref 11.5–15.5)
WBC: 13.5 10*3/uL — ABNORMAL HIGH (ref 4.0–10.5)
nRBC: 0.9 % — ABNORMAL HIGH (ref 0.0–0.2)

## 2020-04-07 LAB — LACTIC ACID, PLASMA: Lactic Acid, Venous: 3.2 mmol/L (ref 0.5–1.9)

## 2020-04-07 LAB — I-STAT ARTERIAL BLOOD GAS, ED
Acid-base deficit: 4 mmol/L — ABNORMAL HIGH (ref 0.0–2.0)
Bicarbonate: 19 mmol/L — ABNORMAL LOW (ref 20.0–28.0)
Calcium, Ion: 1.16 mmol/L (ref 1.15–1.40)
HCT: 29 % — ABNORMAL LOW (ref 36.0–46.0)
Hemoglobin: 9.9 g/dL — ABNORMAL LOW (ref 12.0–15.0)
O2 Saturation: 81 %
Potassium: 4.6 mmol/L (ref 3.5–5.1)
Sodium: 148 mmol/L — ABNORMAL HIGH (ref 135–145)
TCO2: 20 mmol/L — ABNORMAL LOW (ref 22–32)
pCO2 arterial: 28.1 mmHg — ABNORMAL LOW (ref 32.0–48.0)
pH, Arterial: 7.437 (ref 7.350–7.450)
pO2, Arterial: 42 mmHg — ABNORMAL LOW (ref 83.0–108.0)

## 2020-04-07 LAB — COMPREHENSIVE METABOLIC PANEL
ALT: 24 U/L (ref 0–44)
AST: 39 U/L (ref 15–41)
Albumin: 2.8 g/dL — ABNORMAL LOW (ref 3.5–5.0)
Alkaline Phosphatase: 118 U/L (ref 38–126)
Anion gap: 16 — ABNORMAL HIGH (ref 5–15)
BUN: 67 mg/dL — ABNORMAL HIGH (ref 8–23)
CO2: 16 mmol/L — ABNORMAL LOW (ref 22–32)
Calcium: 8.6 mg/dL — ABNORMAL LOW (ref 8.9–10.3)
Chloride: 115 mmol/L — ABNORMAL HIGH (ref 98–111)
Creatinine, Ser: 4.45 mg/dL — ABNORMAL HIGH (ref 0.44–1.00)
GFR, Estimated: 10 mL/min — ABNORMAL LOW (ref >60)
Glucose, Bld: 86 mg/dL (ref 70–99)
Potassium: 5.1 mmol/L (ref 3.5–5.1)
Sodium: 147 mmol/L — ABNORMAL HIGH (ref 135–145)
Total Bilirubin: 0.9 mg/dL (ref 0.3–1.2)
Total Protein: 7.6 g/dL (ref 6.5–8.1)

## 2020-04-07 LAB — I-STAT VENOUS BLOOD GAS, ED
Acid-base deficit: 6 mmol/L — ABNORMAL HIGH (ref 0.0–2.0)
Bicarbonate: 18.3 mmol/L — ABNORMAL LOW (ref 20.0–28.0)
Calcium, Ion: 1.06 mmol/L — ABNORMAL LOW (ref 1.15–1.40)
HCT: 31 % — ABNORMAL LOW (ref 36.0–46.0)
Hemoglobin: 10.5 g/dL — ABNORMAL LOW (ref 12.0–15.0)
O2 Saturation: 94 %
Potassium: 4.3 mmol/L (ref 3.5–5.1)
Sodium: 148 mmol/L — ABNORMAL HIGH (ref 135–145)
TCO2: 19 mmol/L — ABNORMAL LOW (ref 22–32)
pCO2, Ven: 31.3 mmHg — ABNORMAL LOW (ref 44.0–60.0)
pH, Ven: 7.374 (ref 7.250–7.430)
pO2, Ven: 72 mmHg — ABNORMAL HIGH (ref 32.0–45.0)

## 2020-04-07 LAB — CBG MONITORING, ED
Glucose-Capillary: 82 mg/dL (ref 70–99)
Glucose-Capillary: 83 mg/dL (ref 70–99)

## 2020-04-07 IMAGING — DX DG CHEST 1V PORT
1 series · 1 of 1 positions shown · non-contrast
Comparison: Multiple priors, most recent [DATE].

CLINICAL DATA: Altered mental status.

EXAM:
PORTABLE CHEST 1 VIEW

[chest ap]
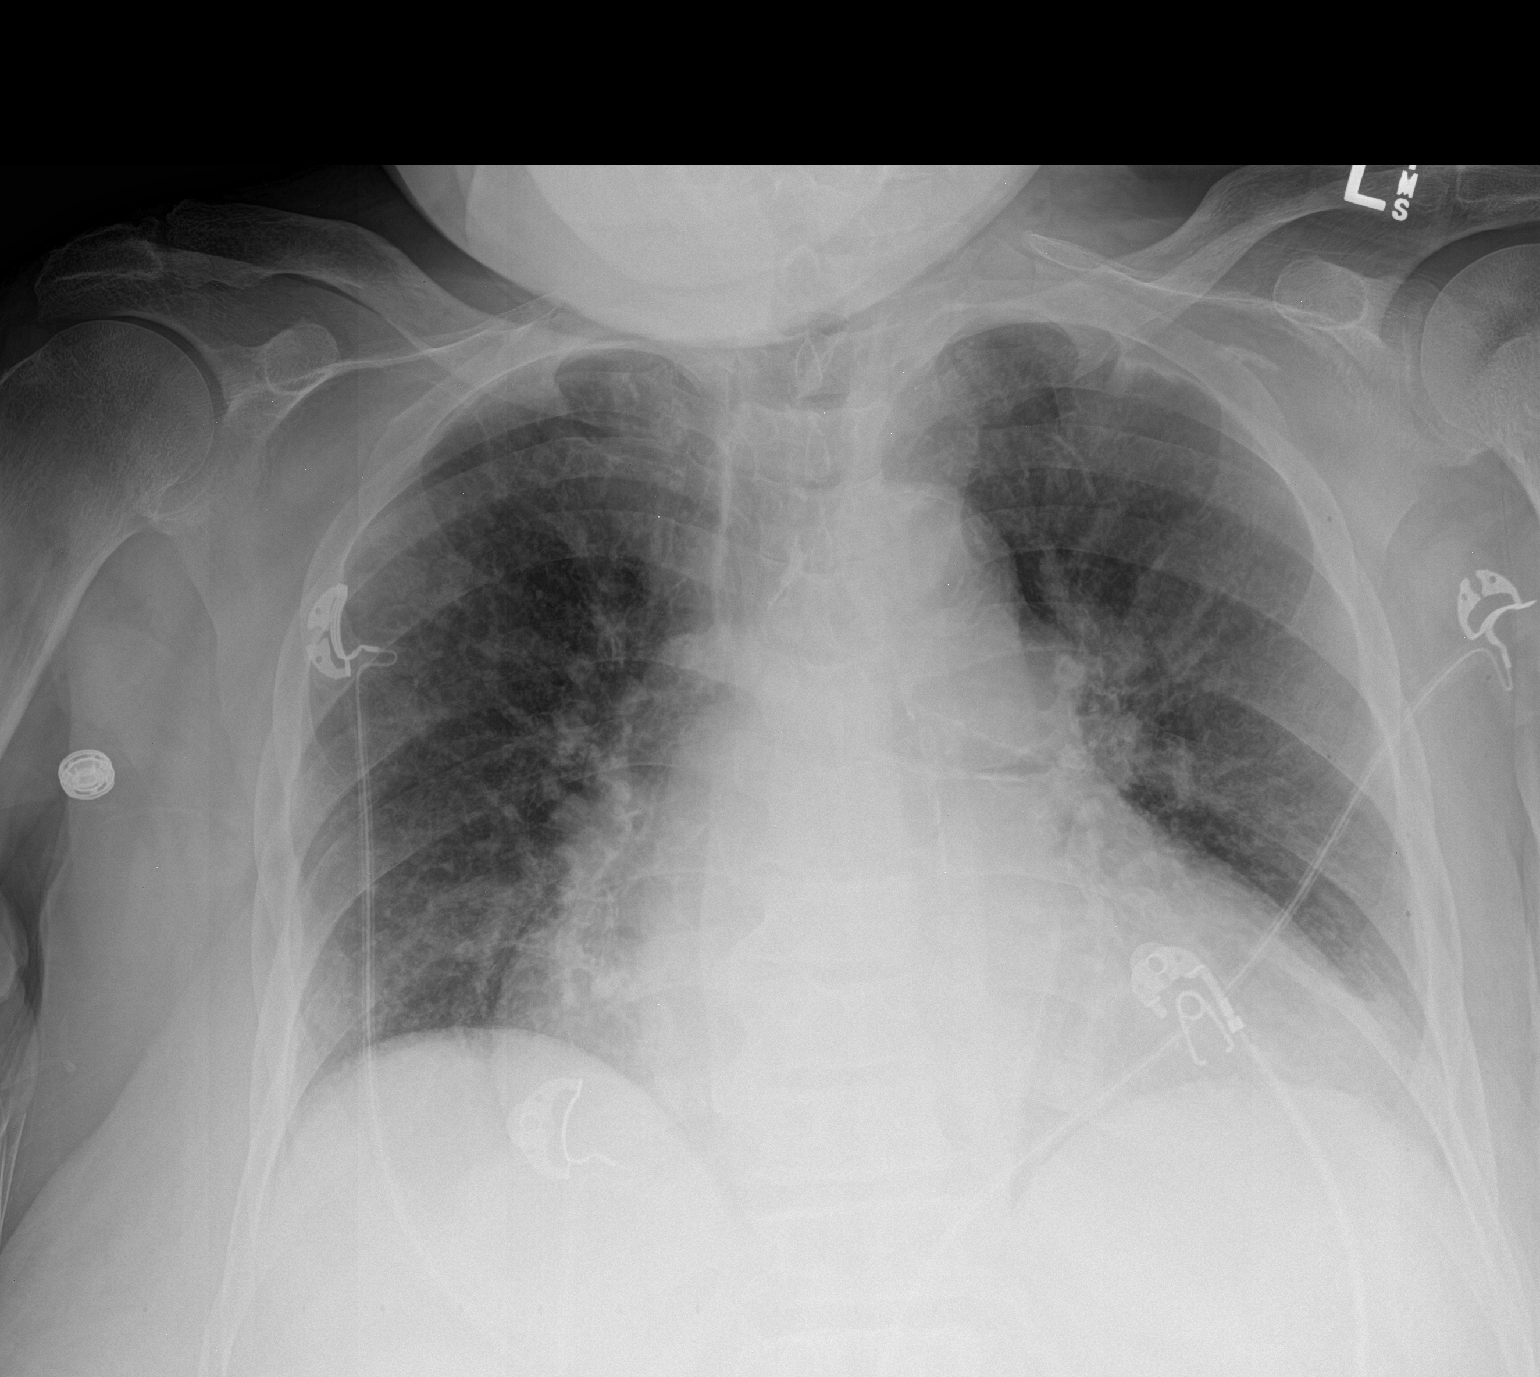

[1 of 1 positions shown; findings below may reference images not displayed]

FINDINGS: Similar enlarged cardiac silhouette. Low lung volumes. Mild
interstitial opacities, favored chronic given similar appearance on
prior chest radiograph from [DATE] with normal chest CT from
that same day. No consolidation. No visible pleural effusions or
pneumothorax.
IMPRESSION: 1. No evidence of acute cardiopulmonary disease.
2. Cardiomegaly.

## 2020-04-07 IMAGING — CT CT HEAD W/O CM
2 series · 15 of 37 positions shown, 18 images · non-contrast
Comparison: Head CT dated [DATE].

CLINICAL DATA: 74-year-old female with altered mental status.

EXAM:
CT HEAD WITHOUT CONTRAST
TECHNIQUE: Contiguous axial images were obtained from the base of the skull
through the vertex without intravenous contrast.

[Series 3: head 5.0 h30s · axial · 0.40mm/px · z∈[-205,-70]mm · 12 of 33 slices shown, 15 images]
[im 3/33  brain]
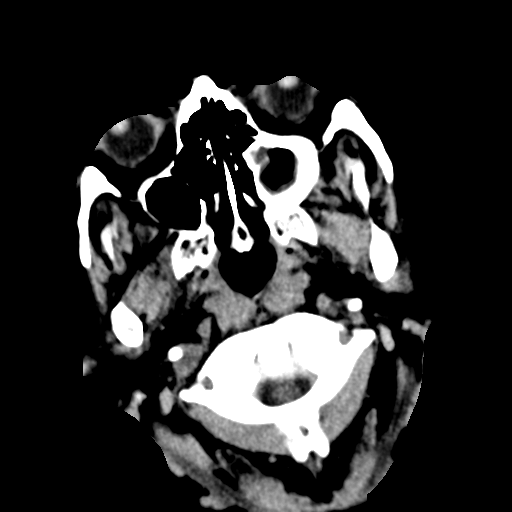
[im 3/33  bone]
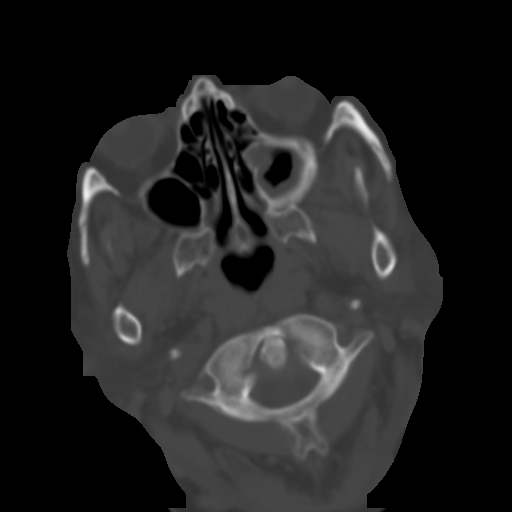
[im 5/33  brain]
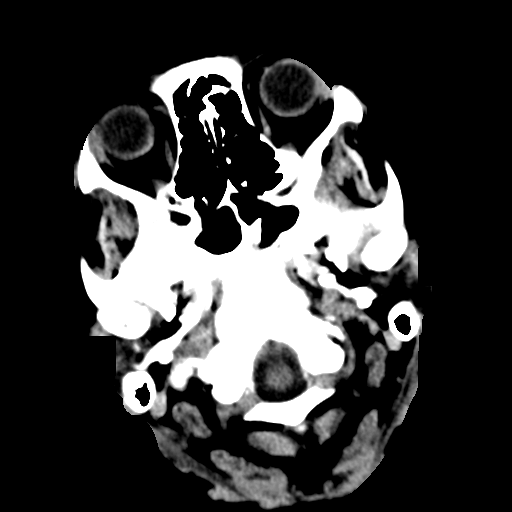
[im 7/33  brain]
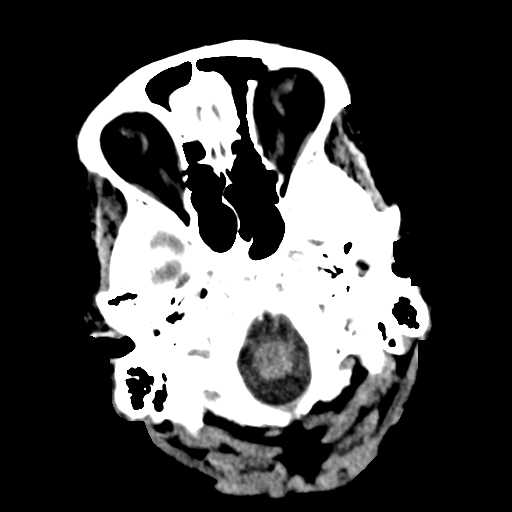
[im 10/33  brain]
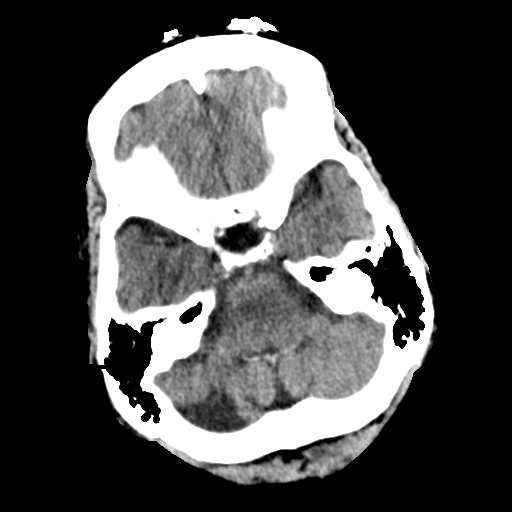
[im 13/33  brain]
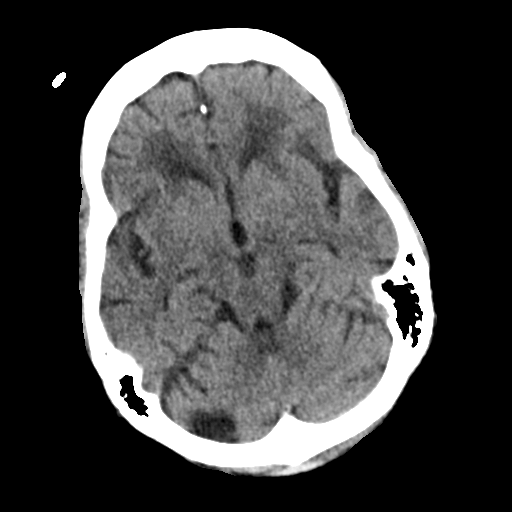
[im 13/33  bone]
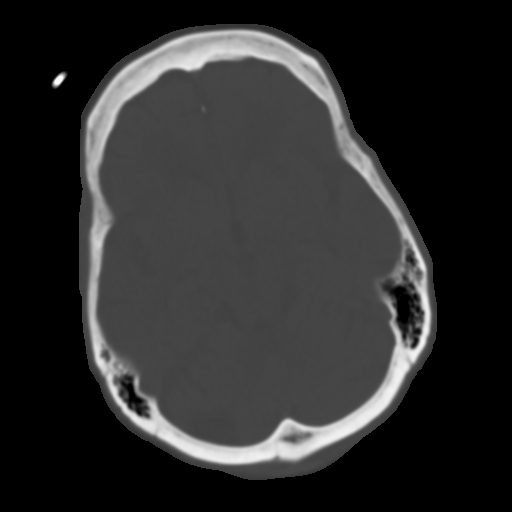
[im 15/33  brain]
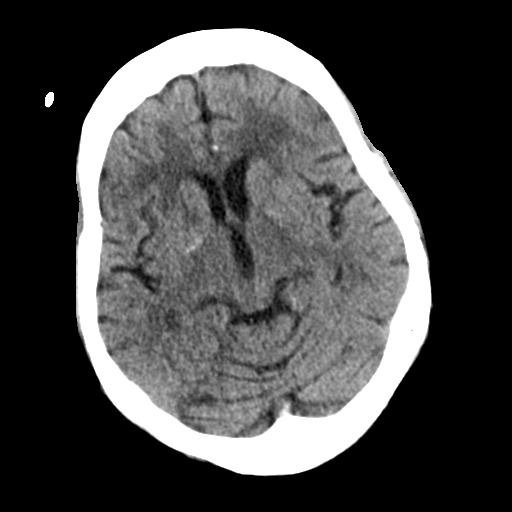
[im 18/33  brain]
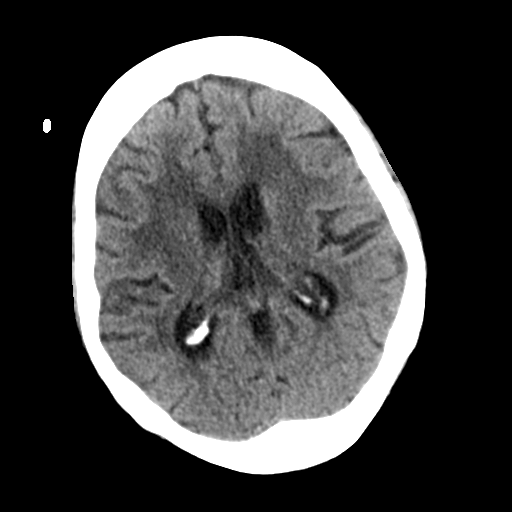
[im 20/33  brain]
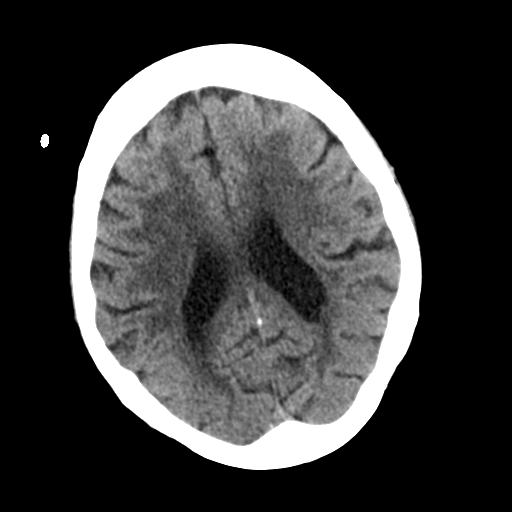
[im 23/33  brain]
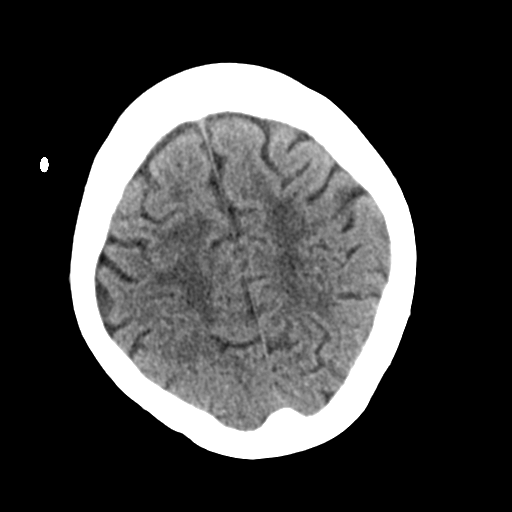
[im 23/33  bone]
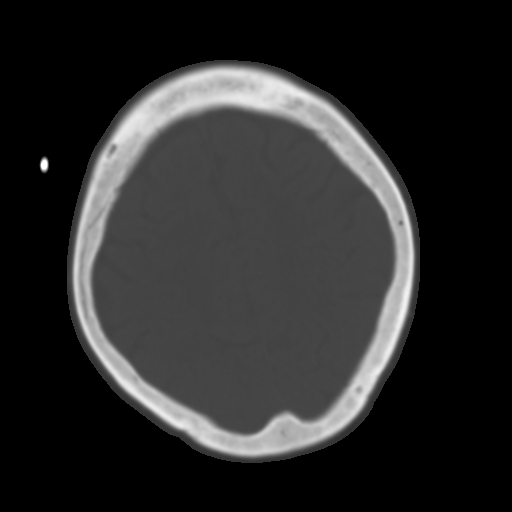
[im 26/33  brain]
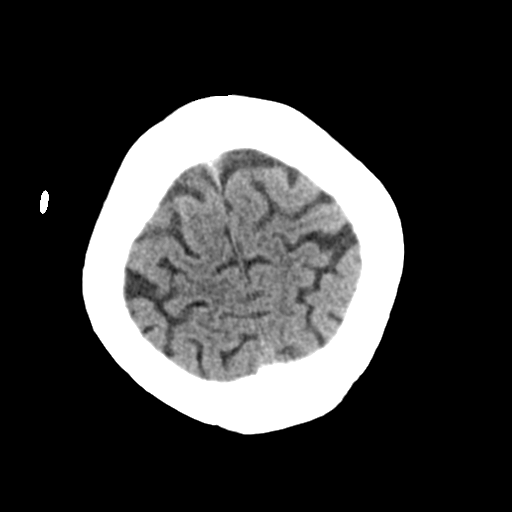
[im 28/33  brain]
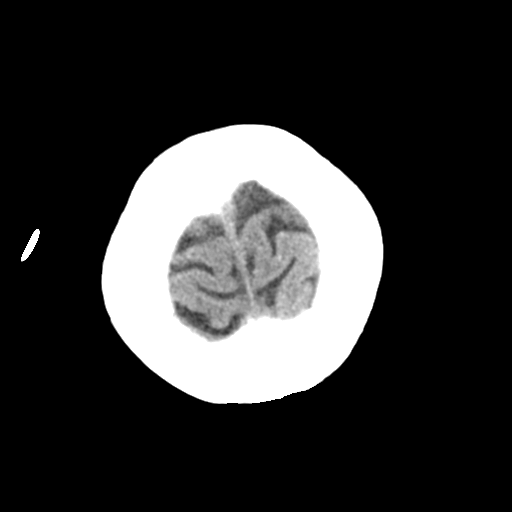
[im 30/33  brain]
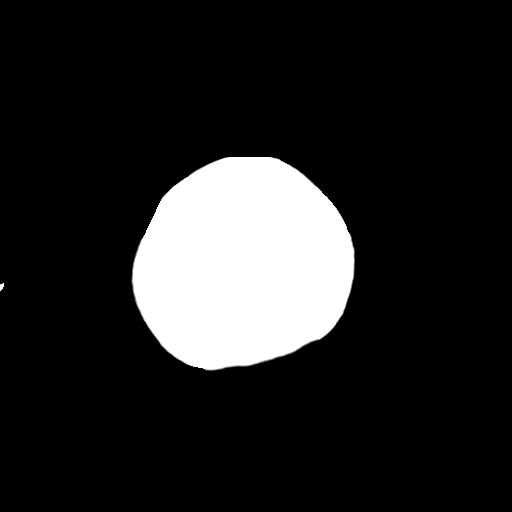

[Series 6: head 3.0 mpr sag · sagittal · 0.32mm/px · 3 of 56 slices shown]
[im 19/56  brain]
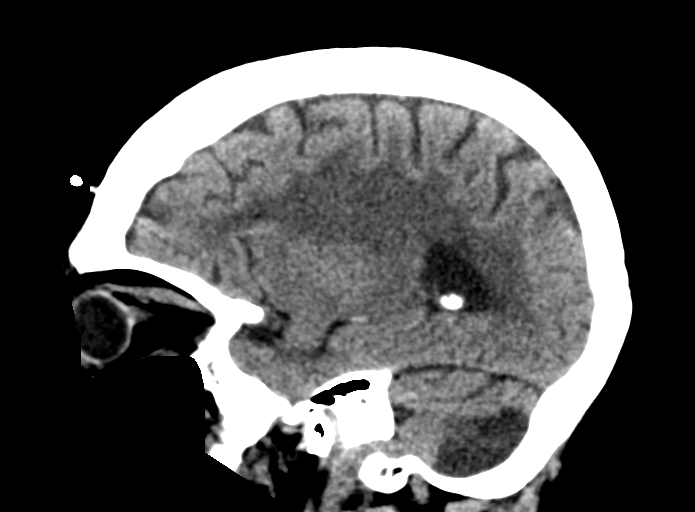
[im 28/56  brain]
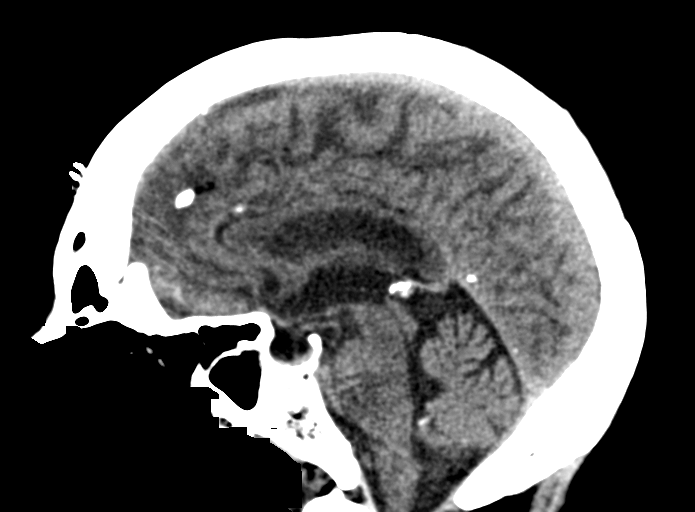
[im 37/56  brain]
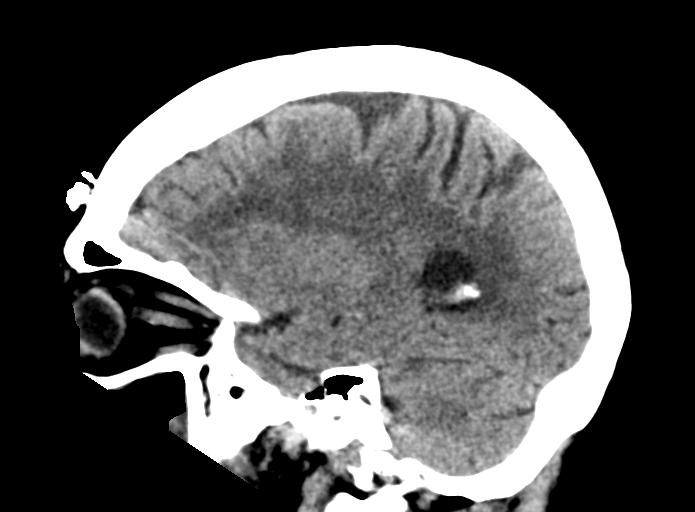

[15 of 37 positions shown; findings below may reference images not displayed]

FINDINGS: Brain: There is mild age-related atrophy and moderate chronic
microvascular ischemic changes. Old right cerebellar infarct and
encephalomalacia. There is no acute intracranial hemorrhage. No mass
effect or midline shift. No extra-axial fluid collection.

Vascular: No hyperdense vessel or unexpected calcification.

Skull: Normal. Negative for fracture or focal lesion.

Sinuses/Orbits: Diffuse mucoperiosteal thickening of the left
maxillary sinus. The remainder of the visualized paranasal sinuses
and mastoid air cells are clear.

Other: None
IMPRESSION: 1. No acute intracranial pathology.
2. Age-related atrophy and chronic microvascular ischemic changes.
Old right cerebellar infarct and encephalomalacia.

## 2020-04-07 IMAGING — DX DG ABD PORTABLE 1V
1 series · 1 of 1 positions shown · non-contrast
Comparison: None.

CLINICAL DATA: COVID positive with abdominal distension.

EXAM:
PORTABLE ABDOMEN - 1 VIEW

[abdomen kub]
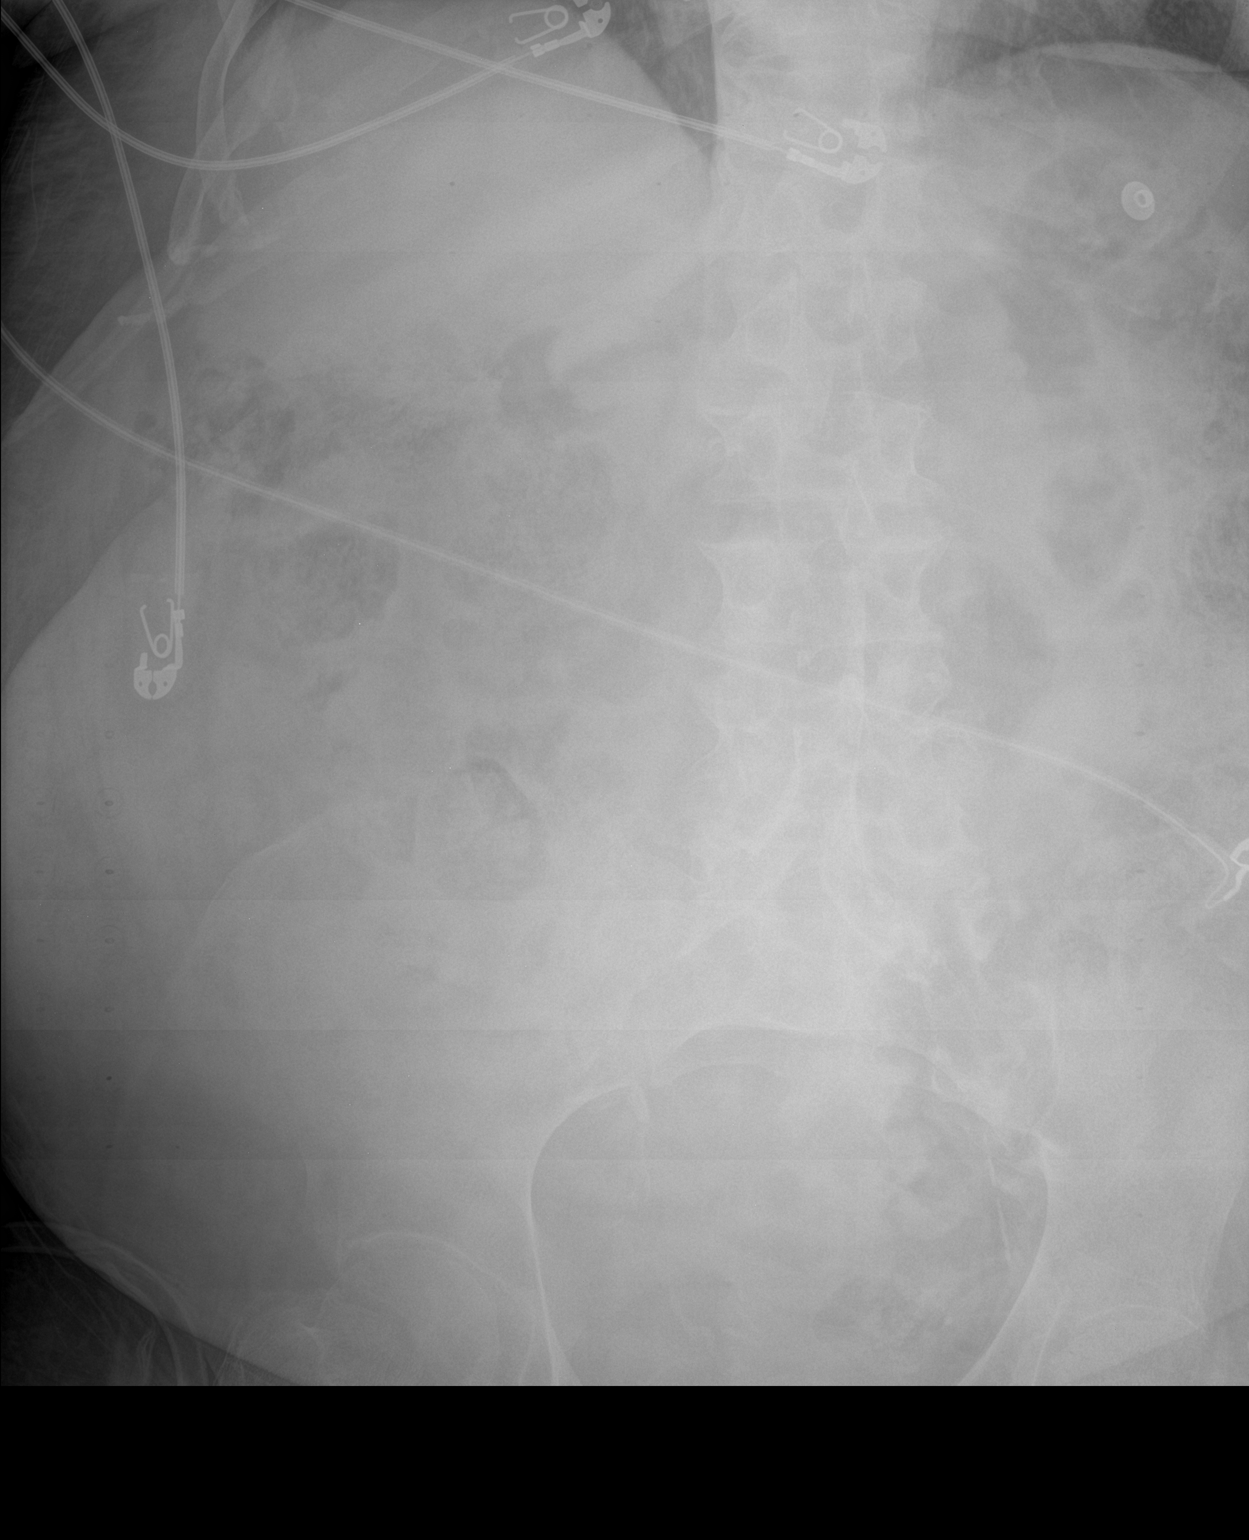

[1 of 1 positions shown; findings below may reference images not displayed]

FINDINGS: The study is limited secondary to the patient's body habitus. The
bowel gas pattern is normal. A large amount of stool is seen
throughout the colon. No radio-opaque calculi or other significant
radiographic abnormality are seen.
IMPRESSION: 1. Large stool burden, without evidence of bowel obstruction.

## 2020-04-07 MED ORDER — SODIUM CHLORIDE 0.9 % IV BOLUS
500.0000 mL | Freq: Once | INTRAVENOUS | Status: AC
  Administered 2020-04-07: 500 mL via INTRAVENOUS

## 2020-04-07 MED ORDER — MILK AND MOLASSES ENEMA
1.0000 | Freq: Once | RECTAL | Status: AC
  Administered 2020-04-08: 240 mL via RECTAL
  Filled 2020-04-07: qty 240

## 2020-04-07 MED ORDER — DOCUSATE SODIUM 100 MG PO CAPS: 100.0000 mg | ORAL_CAPSULE | Freq: Two times a day (BID) | ORAL | Status: DC

## 2020-04-07 MED ORDER — BISACODYL 10 MG RE SUPP
10.0000 mg | Freq: Once | RECTAL | Status: AC
  Administered 2020-04-08: 10 mg via RECTAL
  Filled 2020-04-07: qty 1

## 2020-04-07 NOTE — H&P (Addendum)
Stephanie Griffith ZOX:[id number] DOB: Oct 30, 1945 DOA: 04/16/2020   PCP: Stephanie Carol, MD   Outpatient Specialists:  NONE    Patient arrived to ER on 04/20/2020 at 1257 Referred by Attending Stephanie Reichert, MD   Patient coming from: home Lives  With family moved with son    Chief Complaint:    Chief Complaint  Patient presents with  . Altered Mental Status    HPI: Stephanie Griffith is a 75 y.o. female with medical history significant of anemia, chronic diastolic CHF, chronic renal sufficiency stage IV, COPD, HLD, HTN, PAD, paroxysmal atrial fibrillation, pulmonary hypertension,    Presented with worsening confusion and decreased Po intake since her discharge  Recently admitted with COVID pneumonia and encephalopathy was recommended to go to SNF but went home instead.  Was discharged on 3 January Patient received remdesivir and steroids while hospitalized Delirium was treated with Seroquel Suspected underlying dementia which has worsened in the setting of acute illness. At the time of discharge patient was on room air her inflammatory markers was down trending. She did have some nongap metabolic acidosis which felt to be secondary to CKD she is taking oral bicarbonate for that.  Family notes she has been more confused in the end of the day Family noted more dementia for the past few months  Covid symptoms started around 12/29  Infectious risk factors:  Reports none   KNOWN COVID POSITIVE   Has  been vaccinated against COVID has not been boosted    Lab Results  Component Value Date   [place] 03/02/2020   [place] NEGATIVE 02/12/2019   [place] NEGATIVE 02/06/2019    Regarding pertinent Chronic problems:     Hyperlipidemia -  on statins Lipitor Lipid Panel     Component Value Date/Time   CHOL 126 02/11/2019 1430   TRIG 118 02/11/2019 1430   HDL 37 (L) 02/11/2019 1430   CHOLHDL 3.4 02/11/2019 1430   VLDL 24 02/11/2019 1430   LDLCALC 65 02/11/2019  1430     HTN on Norvasc and beta-blocker which was increased during her last admission   chronic CHF diastolic  - last echo November 2020 60 to  65%. On Lasix  History of pulmonary hypertension followed by cardiology    obesity-   BMI Readings from Last 1 Encounters:  04/20/2020 30.10 kg/m        Hx of CVA -  With  residual deficits slurred speech on Aspirin 81 mg,      A. Fib -  - CHA2DS2 vas score    7   It was an isolated event in November 2020 was not started on anticoagulation secondary to history of anemia only takes aspirin   -  Rate control:  Currently controlled with  Metoprolol,       CKD stage IV- baseline Cr 4.5  does not want to have HD  Lab Results  Component Value Date   CREATININE 4.60 (H) 04/27/2020   CREATININE 4.45 (H) 04/26/2020   CREATININE 4.53 (H) 04/04/2020         Dementia - new diagnosis   Chronic anemia - baseline hg Hemoglobin & Hematocrit  Recent Labs    04/19/2020 1318 04/03/2020 1346 04/18/2020 1347  HGB 10.5* 10.5* 10.5*    Peripheral vascular disease status post left AKA  While in ER: CT head neg no evidence of UTI Appears dehydrated Does not follow command  Lactic acid 3.2   Hospitalist was called for admission for acute  encphalopathy  The following Work up has been ordered so far:  Orders Placed This Encounter  Procedures  . Urine culture  . Culture, blood (single)  . DG Chest Portable 1 View  . CT Head Wo Contrast  . Blood gas, arterial  . Comprehensive metabolic panel  . CBC WITH DIFFERENTIAL  . Urinalysis, Complete w Microscopic  . Lactic acid, plasma  . Protime-INR  . Cardiac monitoring  . Initiate Carrier Fluid Protocol  . In and Out Cath  . Check Rectal Temperature  . Consult to hospitalist  ALL PATIENTS BEING ADMITTED/HAVING PROCEDURES NEED COVID-19 SCREENING  . Consult to hospitalist  ALL PATIENTS BEING ADMITTED/HAVING PROCEDURES NEED COVID-19 SCREENING  . Pulse oximetry, continuous  . I-Stat arterial blood  gas, ED  . CBG monitoring, ED  . I-Stat venous blood gas, [facility] ED)  . I-stat chem 8, ED (not at [facility] or [facility])  . CBG monitoring, ED  . EKG 12-Lead  . ED EKG  . Insert peripheral IV    Following Medications were ordered in ER: Medications  sodium chloride 0.9 % bolus 500 mL (0 mLs Intravenous Stopped 04/26/2020 1502)        Consult Orders  (From admission, onward)         Start     Ordered   04/22/2020 1852  Consult to hospitalist  ALL PATIENTS BEING ADMITTED/HAVING PROCEDURES NEED COVID-19 SCREENING  Once       Comments: ALL PATIENTS BEING ADMITTED/HAVING PROCEDURES NEED COVID-19 SCREENING  Provider:  (Not yet assigned)  Question Answer Comment  Place call to: [facility]   Reason for Consult Admit      04/06/2020 1851   04/14/2020 1749  Consult to hospitalist  ALL PATIENTS BEING ADMITTED/HAVING PROCEDURES NEED COVID-19 SCREENING  Once       Comments: ALL PATIENTS BEING ADMITTED/HAVING PROCEDURES NEED COVID-19 SCREENING  Provider:  (Not yet assigned)  Question Answer Comment  Place call to: [facility]   Reason for Consult Admit      04/15/2020 1748          Significant initial  Findings: Abnormal Labs Reviewed  COMPREHENSIVE METABOLIC PANEL - Abnormal; Notable for the following components:      Result Value   Sodium 147 (*)    Chloride 115 (*)    CO2 16 (*)    BUN 67 (*)    Creatinine, Ser 4.45 (*)    Calcium 8.6 (*)    Albumin 2.8 (*)    GFR, Estimated 10 (*)    Anion gap 16 (*)    All other components within normal limits  CBC WITH DIFFERENTIAL/PLATELET - Abnormal; Notable for the following components:   WBC 13.5 (*)    RBC 3.55 (*)    Hemoglobin 10.5 (*)    HCT 34.3 (*)    RDW 17.0 (*)    nRBC 0.9 (*)    Neutro Abs 9.7 (*)    Monocytes Absolute 1.7 (*)    Abs Immature Granulocytes 0.60 (*)    All other components within normal limits  URINALYSIS, COMPLETE (UACMP) WITH MICROSCOPIC - Abnormal; Notable for the following components:   Protein, ur 100  (*)    All other components within normal limits  LACTIC ACID, PLASMA - Abnormal; Notable for the following components:   Lactic Acid, Venous 3.2 (*)    All other components within normal limits  I-STAT ARTERIAL BLOOD GAS, ED - Abnormal; Notable for the following components:   pCO2 arterial 28.1 (*)  pO2, Arterial 42 (*)    Bicarbonate 19.0 (*)    TCO2 20 (*)    Acid-base deficit 4.0 (*)    Sodium 148 (*)    HCT 29.0 (*)    Hemoglobin 9.9 (*)    All other components within normal limits  I-STAT VENOUS BLOOD GAS, ED - Abnormal; Notable for the following components:   pCO2, Ven 31.3 (*)    pO2, Ven 72.0 (*)    Bicarbonate 18.3 (*)    TCO2 19 (*)    Acid-base deficit 6.0 (*)    Sodium 148 (*)    Calcium, Ion 1.06 (*)    HCT 31.0 (*)    Hemoglobin 10.5 (*)    All other components within normal limits  I-STAT CHEM 8, ED - Abnormal; Notable for the following components:   Sodium 148 (*)    Chloride 117 (*)    BUN 64 (*)    Creatinine, Ser 4.60 (*)    Calcium, Ion 1.06 (*)    TCO2 19 (*)    Hemoglobin 10.5 (*)    HCT 31.0 (*)    All other components within normal limits    Otherwise labs showing:    Recent Labs  Lab 04/01/20 0600 04/02/20 0505 04/03/20 0151 04/04/20 0335 04/16/2020 1316 04/19/2020 1318 04/09/2020 1346 04/27/2020 1347  NA 144 144 146* 143 148* 147* 148* 148*  K 3.5 4.1 4.0 4.5 4.6 5.1 4.3 4.4  CO2 16* 16* 18* 17*  --  16*  --   --   GLUCOSE 123* 128* 139* 122*  --  86  --  85  BUN 51* 55* 57* 58*  --  67*  --  64*  CREATININE 4.56* 4.57* 4.54* 4.53*  --  4.45*  --  4.60*  CALCIUM 8.4* 8.2* 8.4* 8.3*  --  8.6*  --   --   MG 2.2  --   --   --   --   --   --   --   PHOS 4.9*  --   --   --   --   --   --   --     Cr   Stable,  Lab Results  Component Value Date   CREATININE 4.60 (H) 04/22/2020   CREATININE 4.45 (H) 04/14/2020   CREATININE 4.53 (H) 04/04/2020    Recent Labs  Lab 04/01/20 0600 04/02/20 0505 04/03/20 0151 04/11/2020 1318  AST 40 47*  47* 39  ALT 19 18 22 24   ALKPHOS 117 105 124 118  BILITOT 0.6 1.1 1.0 0.9  PROT 7.2 7.0 7.3 7.6  ALBUMIN 2.6* 2.5* 2.7* 2.8*   Lab Results  Component Value Date   CALCIUM 8.6 (L) 04/08/2020   PHOS 4.9 (H) 04/01/2020     WBC      Component Value Date/Time   WBC 13.5 (H) 04/06/2020 1318   LYMPHSABS 1.4 04/28/2020 1318   MONOABS 1.7 (H) 04/29/2020 1318   EOSABS 0.0 04/29/2020 1318   BASOSABS 0.1 04/15/2020 1318    Plt: Lab Results  Component Value Date   PLT 208 04/11/2020    Lactic Acid, Venous    Component Value Date/Time   LATICACIDVEN 3.2 (HH) 04/28/2020 1326     Procalcitonin  Ordered   ABG    Component Value Date/Time   PHART 7.437 04/25/2020 1316   PCO2ART 28.1 (L) 04/16/2020 1316   PO2ART 42 (L) 04/14/2020 1316   HCO3 18.3 (L) 04/18/2020 1346   TCO2 19 (  L) 04/08/2020 1347   ACIDBASEDEF 6.0 (H) 04/22/2020 1346   O2SAT 94.0 05/02/2020 1346    HG/HCT  stable,       Component Value Date/Time   HGB 10.5 (L) 04/03/2020 1347   HCT 31.0 (L) 04/16/2020 1347   HCT 23.1 (L) 02/08/2019 0401   MCV 96.6 04/05/2020 1318    No results for input(s): LIPASE, AMYLASE in the last 168 hours. No results for input(s): AMMONIA in the last 168 hours.      Troponin  ordered   ECG: Ordered Personally reviewed by me showing: HR : 113 Rhythm:  A.fib. W RVR,   nonspecific changes  QTC 475   BNP (last 3 results) Recent Labs    03/02/20 0850  BNP 1,490.5*      DM  labs:  HbA1C: No results for input(s): HGBA1C in the last 8760 hours.     CBG (last 3)  Recent Labs    04/16/2020 1413 04/04/2020 1414  GLUCAP 82 83       UA   no evidence of UTI    Urine analysis:    Component Value Date/Time   COLORURINE YELLOW 04/27/2020 1318   APPEARANCEUR CLEAR 04/06/2020 1318   LABSPEC 1.015 04/27/2020 1318   PHURINE 5.0 04/05/2020 1318   GLUCOSEU NEGATIVE 04/12/2020 1318   HGBUR NEGATIVE 04/03/2020 1318   BILIRUBINUR NEGATIVE 04/02/2020 1318   KETONESUR NEGATIVE  04/12/2020 1318   PROTEINUR 100 (A) 04/15/2020 1318   UROBILINOGEN 0.2 06/18/2011 1751   NITRITE NEGATIVE 04/12/2020 1318   LEUKOCYTESUR NEGATIVE 04/16/2020 1318      Ordered  CT HEAD   NON acute  CXR - cardiomagaly     ED Triage Vitals  Enc Vitals Group     BP 05/01/2020 1300 (!) 168/100     Pulse Rate 04/04/2020 1305 (!) 108     Resp 04/22/2020 1300 (!) 25     Temp 04/18/2020 1305 98.3 F (36.8 C)     Temp Source 04/05/2020 1305 Axillary     SpO2 04/25/2020 1310 100 %     Weight 04/26/2020 1632 197 lb 15.6 oz (89.8 kg)     Height 04/06/2020 1632 5\' 8"  (1.727 m)     Head Circumference --      Peak Flow --      Pain Score --      Pain Loc --      Pain Edu? --      Excl. in [place]? --   TMAX(24)@       Latest  Blood pressure (!) 141/80, pulse (!) 119, temperature 98.8 F (37.1 C), temperature source Oral, resp. rate 18, height 5\' 8"  (1.727 m), weight 89.8 kg, SpO2 96 %.    Review of Systems:    Pertinent positives include: fatigue,    lower extremity swelling    Constitutional:  No weight loss, night sweats, Fevers, chills,  weight loss  HEENT:  No headaches, Difficulty swallowing,Tooth/dental problems,Sore throat,  No sneezing, itching, ear ache, nasal congestion, post nasal drip,  Cardio-vascular:  No chest pain, Orthopnea, PND, anasarca, dizziness, palpitations.no BilateralGI:  No heartburn, indigestion, abdominal pain, nausea, vomiting, diarrhea, change in bowel habits, loss of appetite, melena, blood in stool, hematemesis Resp:  no shortness of breath at rest. No dyspnea on exertion, No excess mucus, no productive cough, No non-productive cough, No coughing up of blood.No change in color of mucus.No wheezing. Skin:  no rash or lesions. No jaundice GU:  no dysuria, change in color  of urine, no urgency or frequency. No straining to urinate.  No flank pain.  Musculoskeletal:  No joint pain or no joint swelling. No decreased range of motion. No back pain.  Psych:  No change in  mood or affect. No depression or anxiety. No memory loss.  Neuro: no localizing neurological complaints, no tingling, no weakness, no double vision, no gait abnormality, no slurred speech, no confusion  All systems reviewed and apart from [place] all are negative  Past Medical History:   Past Medical History:  Diagnosis Date  . Anemia of chronic disease   . Chronic diastolic CHF (congestive heart failure) ([place])   . Chronic renal insufficiency, stage IV (severe) ([place])   . COPD (chronic obstructive pulmonary disease) ([place])   . Hyperglycemia   . Hyperlipidemia   . Hypertension   . PAD (peripheral artery disease) ([place])    a. s/p femoral to below knee popliteal bypass then L AKA.  Marland Kitchen PAF (paroxysmal atrial fibrillation) ([place])   . PAT (paroxysmal atrial tachycardia) ([place])   . Pulmonary hypertension ([place])   . Umbilical hernia        Past Surgical History:  Procedure Laterality Date  . AMPUTATION  06/06/2011   Procedure: AMPUTATION DIGIT;  Surgeon: Elam Dutch, MD;  Location: [facility] OR;  Service: Vascular;  Laterality: Left;  Transmetatarsal amputation left great toe  . AMPUTATION  06/12/2011   Procedure: AMPUTATION ABOVE KNEE;  Surgeon: Elam Dutch, MD;  Location: [place];  Service: Vascular;  Laterality: Left;  . FEMORAL-POPLITEAL BYPASS GRAFT  06/06/2011   Procedure: BYPASS GRAFT FEMORAL-POPLITEAL ARTERY;  Surgeon: Elam Dutch, MD;  Location: [facility] OR;  Service: Vascular;  Laterality: Left;  left femoral to popliteal bypass with composite vein and propaten 16mm x 80 graft  . LOWER EXTREMITY ANGIOGRAM N/A 06/04/2011   Procedure: LOWER EXTREMITY ANGIOGRAM;  Surgeon: Angelia Mould, MD;  Location: [facility] CATH LAB;  Service: Cardiovascular;  Laterality: N/A;  . OMENTECTOMY  12/31/2015   Procedure: Partial OMENTECTOMY;  Surgeon: Georganna Skeans, MD;  Location: [place];  Service: General;;  . RIGHT HEART CATH N/A 02/13/2019   Procedure: RIGHT HEART CATH;  Surgeon: Jolaine Artist, MD;   Location: [place] CV LAB;  Service: Cardiovascular;  Laterality: N/A;  . TUBAL LIGATION    . VENTRAL HERNIA REPAIR N/A 12/31/2015   Procedure: REPAIR INCARCERATED VENTRAL HERNIA, POSSIBLE BOWEL RESECTION;  Surgeon: Georganna Skeans, MD;  Location: [place];  Service: General;  Laterality: N/A;    Social History:  Ambulatory    wheelchair bound      reports that she quit smoking about 8 years ago. She has a 100.00 pack-year smoking history. She has never used smokeless tobacco. She reports that she does not drink alcohol and does not use drugs.   Family History:   Family History  Problem Relation Age of Onset  . Heart attack Father   . Heart disease Father   . Diabetes Mother   . Hypertension Mother   . Heart attack Brother     Allergies: Allergies  Allergen Reactions  . Morphine And Related Other (See Comments)    AMS     Prior to Admission medications   Medication Sig Start Date End Date Taking? Authorizing Provider  amLODipine (NORVASC) 10 MG tablet Take 10 mg by mouth daily.  07/08/14  Yes [provider]  aspirin 81 MG EC tablet Take 81 mg by mouth daily.   Yes [provider]  atorvastatin (  LIPITOR) 40 MG tablet Take 1 tablet (40 mg total) by mouth daily at 6 PM. Patient taking differently: Take 40 mg by mouth daily. 02/18/19 03/02/20 Yes Ghimire, Dante Gang, MD  calcitRIOL (ROCALTROL) 0.5 MCG capsule Take 0.5 mcg by mouth daily. 02/22/20  Yes [provider]  furosemide (LASIX) 40 MG tablet Take 1 tablet (40 mg total) by mouth daily. 02/19/19  Yes Barb Merino, MD  gabapentin (NEURONTIN) 100 MG capsule Take 100 mg by mouth 2 (two) times daily. 06/23/14  Yes [provider]  metoprolol tartrate (LOPRESSOR) 100 MG tablet Take 1 tablet (100 mg total) by mouth 2 (two) times daily. 04/04/20  Yes Thurnell Lose, MD  Multiple Vitamin (MULTIVITAMIN WITH MINERALS) TABS tablet Take 1 tablet by mouth daily.   Yes [provider]  sodium  bicarbonate 650 MG tablet Take 650 mg by mouth in the morning, at noon, and at bedtime. 12/29/19  Yes [provider]  albuterol (VENTOLIN HFA) 108 (90 Base) MCG/ACT inhaler Inhale 2 puffs into the lungs every 6 (six) hours as needed for wheezing or shortness of breath. Patient not taking: Reported on 04/13/2020 04/04/20   Thurnell Lose, MD   Physical Exam: Vitals with BMI 04/30/2020 04/27/2020 04/26/2020  Height - - -  Weight - - -  BMI - - -  Systolic 144 315 400  Diastolic 80 97 98  Pulse 867 135 107     1. General:  in No Acute distress   Chronically ill -appearing 2. Psychological: Alert not  Oriented 3. Head/ENT:    Dry Mucous Membranes                          Head Non traumatic, neck supple                            Poor Dentition 4. SKIN: decreased Skin turgor,  Skin clean Dry and intact no rash 5. Heart: Regular rate and rhythm no Murmur, no Rub or gallop 6. Lungs:  no wheezes or crackles   7. Abdomen: Soft, non-tender, Non distended  obese  bowel sounds present 8. Lower extremities: no clubbing, cyanosis, trace edema, Left AKA 9. Neurologically Grossly intact, moving all 4 extremities equally   10. MSK: Normal range of motion   All other LABS:     Recent Labs  Lab 04/01/20 0600 04/02/20 0505 04/03/20 0151 04/25/2020 1316 04/10/2020 1318 04/04/2020 1346 04/18/2020 1347  WBC 11.7* 9.9 13.8*  --  13.5*  --   --   NEUTROABS 9.7* 8.5* 11.1*  --  9.7*  --   --   HGB 8.3* 8.5* 9.1* 9.9* 10.5* 10.5* 10.5*  HCT 27.3* 27.6* 27.9* 29.0* 34.3* 31.0* 31.0*  MCV 92.9 92.6 91.2  --  96.6  --   --   PLT 174 230 250  --  208  --   --      Recent Labs  Lab 04/01/20 0600 04/02/20 0505 04/03/20 0151 04/04/20 0335 04/06/2020 1316 04/12/2020 1318 04/21/2020 1346 04/04/2020 1347  NA 144 144 146* 143 148* 147* 148* 148*  K 3.5 4.1 4.0 4.5 4.6 5.1 4.3 4.4  CL 114* 115* 115* 116*  --  115*  --  117*  CO2 16* 16* 18* 17*  --  16*  --   --   GLUCOSE 123* 128* 139* 122*  --  86  --   85  BUN 51* 55* 57* 58*  --  67*  --  64*  CREATININE 4.56* 4.57* 4.54* 4.53*  --  4.45*  --  4.60*  CALCIUM 8.4* 8.2* 8.4* 8.3*  --  8.6*  --   --   MG 2.2  --   --   --   --   --   --   --   PHOS 4.9*  --   --   --   --   --   --   --      Recent Labs  Lab 04/01/20 0600 04/02/20 0505 04/03/20 0151 04/05/2020 1318  AST 40 47* 47* 39  ALT 19 18 22 24   ALKPHOS 117 105 124 118  BILITOT 0.6 1.1 1.0 0.9  PROT 7.2 7.0 7.3 7.6  ALBUMIN 2.6* 2.5* 2.7* 2.8*       Cultures:    Component Value Date/Time   SDES IN/OUT CATH URINE 03/30/2020 1522   SPECREQUEST NONE 03/30/2020 1522   CULT  03/30/2020 1522    NO GROWTH Performed at [facility] [address], [place], [place] [address]    REPTSTATUS 03/31/2020 FINAL 03/30/2020 1522     Radiological Exams on Admission: CT Head Wo Contrast  Result Date: 04/26/2020 CLINICAL DATA:  75 year old female with altered mental status. EXAM: CT HEAD WITHOUT CONTRAST TECHNIQUE: Contiguous axial images were obtained from the base of the skull through the vertex without intravenous contrast. COMPARISON:  Head CT dated 03/30/2020. FINDINGS: Brain: There is mild age-related atrophy and moderate chronic microvascular ischemic changes. 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 There is no acute intracranial hemorrhage. No mass effect or midline shift. No extra-axial fluid collection. Vascular: No hyperdense vessel or unexpected calcification. Skull: Normal. Negative for fracture or focal lesion. Sinuses/Orbits: Diffuse mucoperiosteal thickening of the left maxillary sinus. The remainder of the visualized paranasal sinuses and mastoid air cells are clear. Other: None IMPRESSION: 1. No acute intracranial pathology. 2. Age-related atrophy and chronic microvascular ischemic changes. 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 Electronically Signed   By: Anner Crete M.D.   On: 04/03/2020 17:24   DG Chest Portable 1 View  Result Date:  04/12/2020 CLINICAL DATA:  Altered mental status. EXAM: PORTABLE CHEST 1 VIEW COMPARISON:  Multiple priors, most recent 03/31/2020. FINDINGS: Similar enlarged cardiac silhouette. Low lung volumes. Mild interstitial opacities, favored chronic given similar appearance on prior chest radiograph from 03/02/2020 with normal chest CT from that same day. No consolidation. No visible pleural effusions or pneumothorax. IMPRESSION: 1. No evidence of acute cardiopulmonary disease. 2. Cardiomegaly. Electronically Signed   By: Margaretha Sheffield MD   On: 04/06/2020 13:37   DG Abd Portable 1V  Result Date: 04/17/2020 CLINICAL DATA:  COVID positive with abdominal distension. EXAM: PORTABLE ABDOMEN - 1 VIEW COMPARISON:  None. FINDINGS: The study is limited secondary to the patient's body habitus. The bowel gas pattern is normal. A large amount of stool is seen throughout the colon. No radio-opaque calculi or other significant radiographic abnormality are seen. IMPRESSION: 1. Large stool burden, without evidence of bowel obstruction. Electronically Signed   By: Virgina Norfolk M.D.   On: 05/01/2020 22:23    Chart has been reviewed   Assessment/Plan  75 y.o. female with medical history significant of anemia, chronic diastolic CHF, chronic renal sufficiency stage IV, COPD, HLD, HTN, PAD, paroxysmal atrial fibrillation, pulmonary hypertension,  Admitted for acute encephalopathy   Present on Admission: . Encephalopathy due to COVID-19 virus currently no hypoxia.  Patient recently was admitted and received steroids and remdesivir. Will order inflammatory markers. Supportive management for Covid infection. For completion check ammonia level  . Acute encephalopathy -most likely secondary to underlying infection. Also could be secondary to dementia with sundowning and delirium in the setting of infection. Neurologically nonfocal but patient unable to fully cooperate. If persists could obtain further imaging such as MRI  of the brain to evaluate.  . Atrial fibrillation with RVR ([place]) -restart home dose of metoprolol.  If persists may need to initiate diltiazem drip  . PAD (peripheral artery disease) (HCC) -status post AKA  . Normocytic anemia -chronic stable currently no indication for  . Metabolic acidosis -chronic continue bicarb  Elevated lactic acid.-No evidence of infectious etiology at this point could be secondary to patient being dry.  Will repeat and trend  Hypoalbuminemia-in peripheral edema.  Will check prealbumin nutritional consult. Administer albumin as this may help with fluid fluid spacing  Chronic diastolic CHF-intravascularly likely depleted at this time.  Will hold Lasix for tonight administer albumin may be able to resume Lasix  . Leukocytosis -recently been on steroids otherwise no evidence of bacterial infection at this time  . Dyslipidemia chronic stable continue statins  . Chronic renal insufficiency, stage IV (severe) (HCC) -  -chronic avoid nephrotoxic medications such as NSAIDs, Vanco Zosyn combo,  avoid hypotension, continue to follow renal function   Pulmonary Hypertension - chronic, may contribute adversely in the setting of COVID infection    Constipation - may contribute to decreased PO intake Will order bowel regimen and enema,  Other plan as per orders.  DVT prophylaxis:    Hep [place]      Code Status:    Code Status: Prior FULL CODE as per family  I had personally discussed CODE STATUS with family     Family Communication:   Family not at  Bedside  plan of care was discussed on the phone with   Daughter,   Disposition Plan:     likely will need placement for rehabilitation                        Following barriers for discharge:                                                                                        Will need to be able to tolerate PO                                             Would benefit from PT/OT eval prior to DC  Ordered                    Swallow eval - SLP ordered                                      Transition of care consulted  Nutrition    consulted                                     Consults called: none   Admission status:  ED Disposition    ED Disposition Condition Comment   Admit  The patient appears reasonably stabilized for admission considering the current resources, flow, and capabilities available in the ED at this time, and I doubt any other [facility] requiring further screening and/or treatment in the ED prior to admission is  present.        Obs   Level of care  SDU tele indefinitely please discontinue once patient no longer qualifies COVID-19 Labs    Lab Results  Component Value Date   [place] NEGATIVE 03/02/2020     Precautions: admitted as  covid positive Airborne and Contact precautions   PPE: Used by the provider:   P100  eye Goggles,  Gloves  gown   Wendelin Reader 05/02/2020, 10:57 PM    [facility]     after 2 AM please page floor coverage PA If 7AM-7PM, please contact the day team taking care of the patient using [email or web address]   Patient was evaluated in the context of the global COVID-19 pandemic, which necessitated consideration that the patient might be at risk for infection with the SARS-CoV-2 virus that causes COVID-19. Institutional protocols and algorithms that pertain to the evaluation of patients at risk for COVID-19 are in a state of rapid change based on information released by regulatory bodies including the CDC and federal and state organizations. These policies and algorithms were followed during the patient's care.

## 2020-04-07 NOTE — ED Provider Notes (Signed)
[facility] EMERGENCY DEPARTMENT Provider Note   CSN: [id number] Arrival date & time: 04/02/2020  1257     History Chief Complaint  Patient presents with  . Altered Mental Status    Stephanie Griffith is a 75 y.o. female.  Level 5 caveat due to altered mental status  The history is provided by the EMS personnel and a caregiver.  Altered Mental Status Presenting symptoms: partial responsiveness   Severity:  Moderate Most recent episode:  Yesterday Episode history:  Single Timing:  Constant Progression:  Worsening Chronicity:  Recurrent Context comment:  Recently admitted for confusion and covid. Discharged several days ago, and mental status worsening today. Not eating or drinking and not following commands. Patient was recommended for SNF. Was on seroquel inpatient but not at home. Suspected dementia      Past Medical History:  Diagnosis Date  . Anemia of chronic disease   . Chronic diastolic CHF (congestive heart failure) ([place])   . Chronic renal insufficiency, stage IV (severe) ([place])   . COPD (chronic obstructive pulmonary disease) ([place])   . Hyperglycemia   . Hyperlipidemia   . Hypertension   . PAD (peripheral artery disease) ([place])    a. s/p femoral to below knee popliteal bypass then L AKA.  Marland Kitchen PAF (paroxysmal atrial fibrillation) ([place])   . PAT (paroxysmal atrial tachycardia) ([place])   . Pulmonary hypertension ([place])   . Umbilical hernia     Patient Active Problem List   Diagnosis Date Noted  . Encephalopathy due to COVID-19 virus 03/30/2020  . Dyslipidemia 03/30/2020  . Acute renal failure superimposed on stage 5 chronic kidney disease, not on chronic dialysis ([place]) 03/03/2020  . Chronic diastolic CHF (congestive heart failure) ([place]) 03/03/2020  . Acute metabolic encephalopathy 32/20/2542  . Acute on chronic diastolic CHF (congestive heart failure) ([place]) 02/06/2019  . Chronic renal insufficiency, stage IV (severe) ([place]) 02/06/2019  . Metabolic  acidosis 70/62/3762  . Pressure injury of skin 01/03/2016  . Acute hypoxemic respiratory failure ([place])   . Acute pulmonary edema (H[facility])   . Hyperkalemia 12/30/2015  . Incarcerated hernia 12/30/2015  . Phantom limb pain ([place]) 08/13/2011  . PVD (peripheral vascular disease) with claudication ([place]) 07/26/2011  . S/P AKA (above knee amputation) ([place]) 06/15/2011  . NSTEMI (non-ST elevated myocardial infarction) ([place]) 06/09/2011  . Acute diastolic heart failure ([place]) 06/09/2011  . Atrial fibrillation ([place]) 06/08/2011  . Cholelithiasis and cholecystitis without obstruction 06/03/2011  . Ventral hernia 06/03/2011  . Acute on chronic renal failure ([place]) 06/02/2011  . PAD (peripheral artery disease) ([place]) 06/02/2011  . Dry gangrene ([place]) 05/31/2011  . Abdominal pain 05/31/2011  . Leukocytosis 05/31/2011  . Hyponatremia 05/31/2011  . Hyperglycemia 05/31/2011  . Normocytic anemia 12/22/2007  . Chronic kidney disease with symptom management only, stage 5 ([place]) 12/22/2007  . CHEST PAIN, HX OF 12/22/2007  . Essential hypertension, benign 12/10/2007    Past Surgical History:  Procedure Laterality Date  . AMPUTATION  06/06/2011   Procedure: AMPUTATION DIGIT;  Surgeon: Elam Dutch, MD;  Location: [facility] OR;  Service: Vascular;  Laterality: Left;  Transmetatarsal amputation left great toe  . AMPUTATION  06/12/2011   Procedure: AMPUTATION ABOVE KNEE;  Surgeon: Elam Dutch, MD;  Location: [facility];  Service: Vascular;  Laterality: Left;  . FEMORAL-POPLITEAL BYPASS GRAFT  06/06/2011   Procedure: BYPASS GRAFT FEMORAL-POPLITEAL ARTERY;  Surgeon: Elam Dutch, MD;  Location[facility] OR;  Service: Vascular;  Laterality: Left;  left femoral to popliteal  bypass with composite vein and propaten 62mm x 80 graft  . LOWER EXTREMITY ANGIOGRAM N/A 06/04/2011   Procedure: LOWER EXTREMITY ANGIOGRAM;  Surgeon: Angelia Mould, MD;  Location: [facility] CATH LAB;  Service: Cardiovascular;  Laterality: N/A;  . OMENTECTOMY   12/31/2015   Procedure: Partial OMENTECTOMY;  Surgeon: Georganna Skeans, MD;  Location: [facility];  Service: General;;  . RIGHT HEART CATH N/A 02/13/2019   Procedure: RIGHT HEART CATH;  Surgeon: Jolaine Artist, MD;  Location: [facility] CV LAB;  Service: Cardiovascular;  Laterality: N/A;  . TUBAL LIGATION    . VENTRAL HERNIA REPAIR N/A 12/31/2015   Procedure: REPAIR INCARCERATED VENTRAL HERNIA, POSSIBLE BOWEL RESECTION;  Surgeon: Georganna Skeans, MD;  Location: [facility];  Service: General;  Laterality: N/A;     OB History   No obstetric history on file.     Family History  Problem Relation Age of Onset  . Heart attack Father   . Heart disease Father   . Diabetes Mother   . Hypertension Mother   . Heart attack Brother     Social History   Tobacco Use  . Smoking status: Former Smoker    Packs/day: 2.00    Years: 50.00    Pack years: 100.00    Quit date: 06/01/2011    Years since quitting: 8.8  . Smokeless tobacco: Never Used  Vaping Use  . Vaping Use: Never used  Substance Use Topics  . Alcohol use: No  . Drug use: No    Home Medications Prior to Admission medications   Medication Sig Start Date End Date Taking? Authorizing Provider  albuterol (VENTOLIN HFA) 108 (90 Base) MCG/ACT inhaler Inhale 2 puffs into the lungs every 6 (six) hours as needed for wheezing or shortness of breath. 04/04/20   Thurnell Lose, MD  amLODipine (NORVASC) 10 MG tablet Take 10 mg by mouth daily.  07/08/14   [provider]  aspirin 81 MG EC tablet Take 81 mg by mouth daily.    [provider]  atorvastatin (LIPITOR) 40 MG tablet Take 1 tablet (40 mg total) by mouth daily at 6 PM. Patient taking differently: Take 40 mg by mouth daily. 02/18/19 03/02/20  Barb Merino, MD  calcitRIOL (ROCALTROL) 0.5 MCG capsule Take 0.5 mcg by mouth daily. 02/22/20   [provider]  furosemide (LASIX) 40 MG tablet Take 1 tablet (40 mg total) by mouth daily. 02/19/19   Barb Merino, MD   gabapentin (NEURONTIN) 100 MG capsule Take 100 mg by mouth daily. 06/23/14   [provider]  metoprolol tartrate (LOPRESSOR) 100 MG tablet Take 1 tablet (100 mg total) by mouth 2 (two) times daily. 04/04/20   Thurnell Lose, MD  Multiple Vitamin (MULTIVITAMIN WITH MINERALS) TABS tablet Take 1 tablet by mouth daily.    [provider]  sodium bicarbonate 650 MG tablet Take 650 mg by mouth in the morning, at noon, and at bedtime. 12/29/19   [provider]    Allergies    Morphine and related  Review of Systems   Review of Systems  Unable to perform ROS: Mental status change    Physical Exam Updated Vital Signs BP (!) 147/125   Pulse (!) 46   Temp 99.1 F (37.3 C) (Rectal)   Resp 14   SpO2 100%   Physical Exam Vitals and nursing note reviewed.  Constitutional:      General: She is in acute distress.     Appearance: She is well-developed  and well-nourished.  HENT:     Head: Normocephalic and atraumatic.     Nose: Nose normal.     Mouth/Throat:     Mouth: Mucous membranes are dry.  Eyes:     Extraocular Movements: Extraocular movements intact.     Conjunctiva/sclera: Conjunctivae normal.     Pupils: Pupils are equal, round, and reactive to light.  Cardiovascular:     Rate and Rhythm: Tachycardia present. Rhythm irregular.     Heart sounds: No murmur heard.   Pulmonary:     Effort: Pulmonary effort is normal. No respiratory distress.     Breath sounds: Normal breath sounds.  Abdominal:     Palpations: Abdomen is soft.     Tenderness: There is no abdominal tenderness.  Musculoskeletal:        General: No edema.     Cervical back: Neck supple.  Skin:    General: Skin is warm and dry.     Capillary Refill: Capillary refill takes less than 2 seconds.  Neurological:     GCS: GCS eye subscore is 2. GCS verbal subscore is 2. GCS motor subscore is 5.     Comments: Patient with localization of pain to stimuli, appears uncomfortable and muttering  words  Psychiatric:        Mood and Affect: Mood and affect normal.     ED Results / Procedures / Treatments   Labs (all labs ordered are listed, but only abnormal results are displayed) Labs Reviewed  COMPREHENSIVE METABOLIC PANEL - Abnormal; Notable for the following components:      Result Value   Sodium 147 (*)    Chloride 115 (*)    CO2 16 (*)    BUN 67 (*)    Creatinine, Ser 4.45 (*)    Calcium 8.6 (*)    Albumin 2.8 (*)    GFR, Estimated 10 (*)    Anion gap 16 (*)    All other components within normal limits  CBC WITH DIFFERENTIAL/PLATELET - Abnormal; Notable for the following components:   WBC 13.5 (*)    RBC 3.55 (*)    Hemoglobin 10.5 (*)    HCT 34.3 (*)    RDW 17.0 (*)    nRBC 0.9 (*)    Neutro Abs 9.7 (*)    Monocytes Absolute 1.7 (*)    Abs Immature Granulocytes 0.60 (*)    All other components within normal limits  I-STAT ARTERIAL BLOOD GAS, ED - Abnormal; Notable for the following components:   pCO2 arterial 28.1 (*)    pO2, Arterial 42 (*)    Bicarbonate 19.0 (*)    TCO2 20 (*)    Acid-base deficit 4.0 (*)    Sodium 148 (*)    HCT 29.0 (*)    Hemoglobin 9.9 (*)    All other components within normal limits  I-STAT VENOUS BLOOD GAS, ED - Abnormal; Notable for the following components:   pCO2, Ven 31.3 (*)    pO2, Ven 72.0 (*)    Bicarbonate 18.3 (*)    TCO2 19 (*)    Acid-base deficit 6.0 (*)    Sodium 148 (*)    Calcium, Ion 1.06 (*)    HCT 31.0 (*)    Hemoglobin 10.5 (*)    All other components within normal limits  I-STAT CHEM 8, ED - Abnormal; Notable for the following components:   Sodium 148 (*)    Chloride 117 (*)    BUN 64 (*)    Creatinine,  Ser 4.60 (*)    Calcium, Ion 1.06 (*)    TCO2 19 (*)    Hemoglobin 10.5 (*)    HCT 31.0 (*)    All other components within normal limits  URINE CULTURE  CULTURE, BLOOD (SINGLE)  BLOOD GAS, ARTERIAL  URINALYSIS, COMPLETE (UACMP) WITH MICROSCOPIC  URINALYSIS, ROUTINE W REFLEX MICROSCOPIC   LACTIC ACID, PLASMA  LACTIC ACID, PLASMA  PROTIME-INR  CBG MONITORING, ED  CBG MONITORING, ED    EKG EKG Interpretation  Date/Time:  Thursday April 07 2020 13:06:40 EST Ventricular Rate:  113 PR Interval:    QRS Duration: 135 QT Interval:  339 QTC Calculation: 475 R Axis:   118 Text Interpretation: Atrial fibrillation RBBB and LPFB Nonspecific ST depression Artifact in lead(s) I II aVR aVL Confirmed by Lennice Sites [phone]) on 04/06/2020 1:26:28 PM   Radiology DG Chest Portable 1 View  Result Date: 04/26/2020 CLINICAL DATA:  Altered mental status. EXAM: PORTABLE CHEST 1 VIEW COMPARISON:  Multiple priors, most recent 03/31/2020. FINDINGS: Similar enlarged cardiac silhouette. Low lung volumes. Mild interstitial opacities, favored chronic given similar appearance on prior chest radiograph from 03/02/2020 with normal chest CT from that same day. No consolidation. No visible pleural effusions or pneumothorax. IMPRESSION: 1. No evidence of acute cardiopulmonary disease. 2. Cardiomegaly. Electronically Signed   By: Margaretha Sheffield MD   On: 04/12/2020 13:37    Procedures Procedures (including critical care time)  Medications Ordered in ED Medications  sodium chloride 0.9 % bolus 500 mL (500 mLs Intravenous New Bag/Given 04/28/2020 1402)    ED Course  I have reviewed the triage vital signs and the nursing notes.  Pertinent labs & imaging results that were available during my care of the patient were reviewed by me and considered in my medical decision making (see chart for details).    MDM Rules/Calculators/A&P                          Stephanie Griffith is a 75 year old female with history of hypertension, CKD, pulmonary hypertension, A. fib not on blood thinner, COPD, high cholesterol who presents the ED with altered mental status.  History obtained by EMS and by family.  Was recently admitted for Covid 19 in which she was encephalopathic per chart review.  May be there were some  improvement with Seroquel but overall she was recommended to go to a skilled nursing facility.  Patient had been doing better from a mental standpoint since being home the last several days but got worse since last night and even worse this morning.  She is wheelchair-bound and had no falls.  She has not been able to follow commands or eat or drink.  Overall patient is not following commands but does appear to localize the pain and appears to be trying to matter words at times.  She opens her eyes intermittently to pain as well.  Pupils appear equal and reactive.  She is in A. fib between 90 and 120.  She is not on any blood thinners due to anemia history in the past.  She has chronic kidney disease and does not want dialysis.  Overall she appears to have normal room air oxygenation.  Blood gas overall reassuring.  Suspect arterial blood gas was likely venous as a match fairly well from an oxygen standpoint.  She has no hypercarbia.  She has no signs of volume overload on exam.  Chest x-ray without any signs of infectious process  or volume overload as well.  Blood sugar is normal.  Sodium is 148.  Creatinine appears to be at baseline.  She does not have a fever and rectal temperature is normal.  There is a question that she might have dementia.  Overall suspect multifactorial altered mental status likely in the setting of COVID-19, uremia, possibly dementia.  Will give small fluid bolus and get head CT and remaining lab work and anticipate admission.  Possibility that she suffered a stroke given that she is does have A. fib and is on a blood thinner and will likely pursue MRI.  No seizure-like activity is found on examination at this time but may benefit from an EEG.  Patient does have mild leukocytosis of 13.5.  Hemoglobin 10.5.  Chest x-ray as previously stated without any signs of infectious process.  Awaiting urinalysis and CT scan of head.  As previously stated suspect multifactorial encephalopathy.  I have  not witnessed any seizure-like activity.  Will need admission for further work-up including likely advanced brain imaging.  This chart was dictated using voice recognition software.  Despite best efforts to proofread,  errors can occur which can change the documentation meaning.     Final Clinical Impression(s) / ED Diagnoses Final diagnoses:  Altered mental status, unspecified altered mental status type    Rx / DC Orders ED Discharge Orders    None       Lennice Sites, DO 04/09/2020 1500

## 2020-04-07 NOTE — ED Notes (Signed)
IV access attempted x 3 with no success. IV team consulted.

## 2020-04-07 NOTE — ED Notes (Signed)
cbg 107 by ems

## 2020-04-07 NOTE — ED Triage Notes (Signed)
Pt here from home with aloc and responding to painful stimuli only , was in hospital recently with covid

## 2020-04-07 NOTE — ED Notes (Signed)
Date and time results received: 04/12/2020 *  Test: lactic  Critical Value: 3.2 Name of Provider Notified:[place]

## 2020-04-07 NOTE — ED Provider Notes (Signed)
Patient care assumed at 1500. Patient with recent admission for encephalopathy with COVID-19, stage IV CKD - not a diagnosis candidate, a fib - not on anticoagulation here for evaluation of altered mental status and agitation from home. She was recently admitted to the hospital and discharged for similar symptoms. Nursing facility was recommended but patient and family declined at that time. CT head and UA are pending at time of signout. On examination patient is ill appearing, confused with mumbling speech. She cannot follow commands. She has dry mucous membranes. No nuchal rigidity. BMP with chronic renal insufficiency, elevation in her sodium concerning for dehydration. UA not consistent with UTI. CT head is negative for acute abnormality. Medicine consulted for admission for altered mental status, dehydration in setting of COVID-19 infection.     Quintella Reichert, MD 04/10/2020 2308

## 2020-04-08 ENCOUNTER — Inpatient Hospital Stay (HOSPITAL_COMMUNITY): Payer: Medicare Other

## 2020-04-08 DIAGNOSIS — E870 Hyperosmolality and hypernatremia: Secondary | ICD-10-CM | POA: Diagnosis present

## 2020-04-08 DIAGNOSIS — J449 Chronic obstructive pulmonary disease, unspecified: Secondary | ICD-10-CM | POA: Diagnosis present

## 2020-04-08 DIAGNOSIS — N185 Chronic kidney disease, stage 5: Secondary | ICD-10-CM

## 2020-04-08 DIAGNOSIS — J690 Pneumonitis due to inhalation of food and vomit: Secondary | ICD-10-CM | POA: Diagnosis not present

## 2020-04-08 DIAGNOSIS — N390 Urinary tract infection, site not specified: Secondary | ICD-10-CM | POA: Diagnosis present

## 2020-04-08 DIAGNOSIS — D631 Anemia in chronic kidney disease: Secondary | ICD-10-CM | POA: Diagnosis present

## 2020-04-08 DIAGNOSIS — R6521 Severe sepsis with septic shock: Secondary | ICD-10-CM | POA: Diagnosis not present

## 2020-04-08 DIAGNOSIS — J9602 Acute respiratory failure with hypercapnia: Secondary | ICD-10-CM | POA: Diagnosis not present

## 2020-04-08 DIAGNOSIS — J9621 Acute and chronic respiratory failure with hypoxia: Secondary | ICD-10-CM | POA: Diagnosis not present

## 2020-04-08 DIAGNOSIS — E785 Hyperlipidemia, unspecified: Secondary | ICD-10-CM | POA: Diagnosis present

## 2020-04-08 DIAGNOSIS — N179 Acute kidney failure, unspecified: Secondary | ICD-10-CM | POA: Diagnosis not present

## 2020-04-08 DIAGNOSIS — I4892 Unspecified atrial flutter: Secondary | ICD-10-CM | POA: Diagnosis present

## 2020-04-08 DIAGNOSIS — I132 Hypertensive heart and chronic kidney disease with heart failure and with stage 5 chronic kidney disease, or end stage renal disease: Secondary | ICD-10-CM | POA: Diagnosis present

## 2020-04-08 DIAGNOSIS — I739 Peripheral vascular disease, unspecified: Secondary | ICD-10-CM | POA: Diagnosis present

## 2020-04-08 DIAGNOSIS — I4891 Unspecified atrial fibrillation: Secondary | ICD-10-CM | POA: Diagnosis not present

## 2020-04-08 DIAGNOSIS — Z66 Do not resuscitate: Secondary | ICD-10-CM | POA: Diagnosis not present

## 2020-04-08 DIAGNOSIS — Z7189 Other specified counseling: Secondary | ICD-10-CM | POA: Diagnosis not present

## 2020-04-08 DIAGNOSIS — Z8249 Family history of ischemic heart disease and other diseases of the circulatory system: Secondary | ICD-10-CM | POA: Diagnosis not present

## 2020-04-08 DIAGNOSIS — R140 Abdominal distension (gaseous): Secondary | ICD-10-CM | POA: Diagnosis present

## 2020-04-08 DIAGNOSIS — G9341 Metabolic encephalopathy: Secondary | ICD-10-CM | POA: Diagnosis present

## 2020-04-08 DIAGNOSIS — E876 Hypokalemia: Secondary | ICD-10-CM | POA: Diagnosis not present

## 2020-04-08 DIAGNOSIS — I5032 Chronic diastolic (congestive) heart failure: Secondary | ICD-10-CM | POA: Diagnosis present

## 2020-04-08 DIAGNOSIS — I5031 Acute diastolic (congestive) heart failure: Secondary | ICD-10-CM | POA: Diagnosis not present

## 2020-04-08 DIAGNOSIS — G9340 Encephalopathy, unspecified: Secondary | ICD-10-CM | POA: Diagnosis not present

## 2020-04-08 DIAGNOSIS — J9601 Acute respiratory failure with hypoxia: Secondary | ICD-10-CM | POA: Diagnosis not present

## 2020-04-08 DIAGNOSIS — I252 Old myocardial infarction: Secondary | ICD-10-CM | POA: Diagnosis not present

## 2020-04-08 DIAGNOSIS — E874 Mixed disorder of acid-base balance: Secondary | ICD-10-CM | POA: Diagnosis present

## 2020-04-08 DIAGNOSIS — N186 End stage renal disease: Secondary | ICD-10-CM | POA: Diagnosis present

## 2020-04-08 DIAGNOSIS — R4182 Altered mental status, unspecified: Secondary | ICD-10-CM | POA: Diagnosis not present

## 2020-04-08 DIAGNOSIS — N184 Chronic kidney disease, stage 4 (severe): Secondary | ICD-10-CM | POA: Diagnosis not present

## 2020-04-08 DIAGNOSIS — Z89612 Acquired absence of left leg above knee: Secondary | ICD-10-CM | POA: Diagnosis not present

## 2020-04-08 DIAGNOSIS — Z8616 Personal history of COVID-19: Secondary | ICD-10-CM | POA: Diagnosis not present

## 2020-04-08 DIAGNOSIS — Z87891 Personal history of nicotine dependence: Secondary | ICD-10-CM | POA: Diagnosis not present

## 2020-04-08 DIAGNOSIS — A419 Sepsis, unspecified organism: Secondary | ICD-10-CM | POA: Diagnosis not present

## 2020-04-08 DIAGNOSIS — Z833 Family history of diabetes mellitus: Secondary | ICD-10-CM | POA: Diagnosis not present

## 2020-04-08 LAB — CBC WITH DIFFERENTIAL/PLATELET
Abs Immature Granulocytes: 0.5 10*3/uL — ABNORMAL HIGH (ref 0.00–0.07)
Basophils Absolute: 0 10*3/uL (ref 0.0–0.1)
Basophils Relative: 0 %
Eosinophils Absolute: 0 10*3/uL (ref 0.0–0.5)
Eosinophils Relative: 0 %
HCT: 28.3 % — ABNORMAL LOW (ref 36.0–46.0)
Hemoglobin: 8.7 g/dL — ABNORMAL LOW (ref 12.0–15.0)
Immature Granulocytes: 3 %
Lymphocytes Relative: 7 %
Lymphs Abs: 1.1 10*3/uL (ref 0.7–4.0)
MCH: 28.9 pg (ref 26.0–34.0)
MCHC: 30.7 g/dL (ref 30.0–36.0)
MCV: 94 fL (ref 80.0–100.0)
Monocytes Absolute: 2.5 10*3/uL — ABNORMAL HIGH (ref 0.1–1.0)
Monocytes Relative: 17 %
Neutro Abs: 10.8 10*3/uL — ABNORMAL HIGH (ref 1.7–7.7)
Neutrophils Relative %: 73 %
Platelets: 187 10*3/uL (ref 150–400)
RBC: 3.01 MIL/uL — ABNORMAL LOW (ref 3.87–5.11)
RDW: 17.1 % — ABNORMAL HIGH (ref 11.5–15.5)
WBC: 15 10*3/uL — ABNORMAL HIGH (ref 4.0–10.5)
nRBC: 0.4 % — ABNORMAL HIGH (ref 0.0–0.2)

## 2020-04-08 LAB — COMPREHENSIVE METABOLIC PANEL
ALT: 27 U/L (ref 0–44)
AST: 42 U/L — ABNORMAL HIGH (ref 15–41)
Albumin: 2.8 g/dL — ABNORMAL LOW (ref 3.5–5.0)
Alkaline Phosphatase: 119 U/L (ref 38–126)
Anion gap: 21 — ABNORMAL HIGH (ref 5–15)
BUN: 68 mg/dL — ABNORMAL HIGH (ref 8–23)
CO2: 11 mmol/L — ABNORMAL LOW (ref 22–32)
Calcium: 8.6 mg/dL — ABNORMAL LOW (ref 8.9–10.3)
Chloride: 116 mmol/L — ABNORMAL HIGH (ref 98–111)
Creatinine, Ser: 4.77 mg/dL — ABNORMAL HIGH (ref 0.44–1.00)
GFR, Estimated: 9 mL/min — ABNORMAL LOW (ref >60)
Glucose, Bld: 95 mg/dL (ref 70–99)
Potassium: 4.4 mmol/L (ref 3.5–5.1)
Sodium: 148 mmol/L — ABNORMAL HIGH (ref 135–145)
Total Bilirubin: 1 mg/dL (ref 0.3–1.2)
Total Protein: 7.3 g/dL (ref 6.5–8.1)

## 2020-04-08 LAB — URINE CULTURE: Culture: NO GROWTH

## 2020-04-08 LAB — FERRITIN
Ferritin: 162 ng/mL (ref 11–307)
Ferritin: 143 ng/mL (ref 11–307)

## 2020-04-08 LAB — PROTIME-INR
INR: 1.1 (ref 0.8–1.2)
Prothrombin Time: 13.9 seconds (ref 11.4–15.2)

## 2020-04-08 LAB — MAGNESIUM
Magnesium: 2 mg/dL (ref 1.7–2.4)
Magnesium: 2.1 mg/dL (ref 1.7–2.4)

## 2020-04-08 LAB — AMMONIA: Ammonia: 58 umol/L — ABNORMAL HIGH (ref 9–35)

## 2020-04-08 LAB — LACTATE DEHYDROGENASE: LDH: 363 U/L — ABNORMAL HIGH (ref 98–192)

## 2020-04-08 LAB — D-DIMER, QUANTITATIVE
D-Dimer, Quant: 2.08 ug/mL-FEU — ABNORMAL HIGH (ref 0.00–0.50)
D-Dimer, Quant: 2.15 ug/mL-FEU — ABNORMAL HIGH (ref 0.00–0.50)

## 2020-04-08 LAB — C-REACTIVE PROTEIN
CRP: 8.5 mg/dL — ABNORMAL HIGH (ref <1.0)
CRP: 9.3 mg/dL — ABNORMAL HIGH (ref <1.0)

## 2020-04-08 LAB — GLUCOSE, CAPILLARY
Glucose-Capillary: 95 mg/dL (ref 70–99)
Glucose-Capillary: 87 mg/dL (ref 70–99)

## 2020-04-08 LAB — CBG MONITORING, ED: Glucose-Capillary: 80 mg/dL (ref 70–99)

## 2020-04-08 LAB — PHOSPHORUS: Phosphorus: 4.2 mg/dL (ref 2.5–4.6)

## 2020-04-08 LAB — TROPONIN I (HIGH SENSITIVITY): Troponin I (High Sensitivity): 98 ng/L — ABNORMAL HIGH (ref <18)

## 2020-04-08 LAB — TSH: TSH: 1.894 u[IU]/mL (ref 0.350–4.500)

## 2020-04-08 LAB — PROCALCITONIN: Procalcitonin: 0.16 ng/mL

## 2020-04-08 LAB — LACTIC ACID, PLASMA: Lactic Acid, Venous: 2.7 mmol/L (ref 0.5–1.9)

## 2020-04-08 LAB — CK: Total CK: 318 U/L — ABNORMAL HIGH (ref 38–234)

## 2020-04-08 LAB — FIBRINOGEN: Fibrinogen: 514 mg/dL — ABNORMAL HIGH (ref 210–475)

## 2020-04-08 LAB — PREALBUMIN: Prealbumin: 13.5 mg/dL — ABNORMAL LOW (ref 18–38)

## 2020-04-08 MED ORDER — DILTIAZEM HCL-DEXTROSE 125-5 MG/125ML-% IV SOLN (PREMIX)
5.0000 mg/h | INTRAVENOUS | Status: DC
  Administered 2020-04-08: 10 mg/h via INTRAVENOUS
  Administered 2020-04-09 – 2020-04-11 (×7): 15 mg/h via INTRAVENOUS
  Administered 2020-04-12: 2.5 mg/h via INTRAVENOUS
  Administered 2020-04-13 – 2020-04-15 (×2): 5 mg/h via INTRAVENOUS
  Filled 2020-04-08 (×16): qty 125

## 2020-04-08 MED ORDER — HALOPERIDOL LACTATE 2 MG/ML PO CONC
1.0000 mg | Freq: Four times a day (QID) | ORAL | Status: DC | PRN
  Administered 2020-04-08: 1 mg via ORAL
  Filled 2020-04-08 (×2): qty 0.5

## 2020-04-08 MED ORDER — GUAIFENESIN ER 600 MG PO TB12: 600.0000 mg | ORAL_TABLET | Freq: Two times a day (BID) | ORAL | Status: DC

## 2020-04-08 MED ORDER — BISACODYL 10 MG RE SUPP: 10.0000 mg | Freq: Every day | RECTAL | Status: DC | PRN

## 2020-04-08 MED ORDER — GABAPENTIN 100 MG PO CAPS: 100.0000 mg | ORAL_CAPSULE | Freq: Two times a day (BID) | ORAL | Status: DC

## 2020-04-08 MED ORDER — METOPROLOL TARTRATE 50 MG PO TABS: 100.0000 mg | ORAL_TABLET | Freq: Two times a day (BID) | ORAL | Status: DC

## 2020-04-08 MED ORDER — METOPROLOL TARTRATE 5 MG/5ML IV SOLN
2.5000 mg | Freq: Four times a day (QID) | INTRAVENOUS | Status: DC | PRN
  Administered 2020-04-18 (×2): 2.5 mg via INTRAVENOUS
  Filled 2020-04-08 (×4): qty 5

## 2020-04-08 MED ORDER — ATORVASTATIN CALCIUM 40 MG PO TABS: 40.0000 mg | ORAL_TABLET | Freq: Every day | ORAL | Status: DC

## 2020-04-08 MED ORDER — HEPARIN SODIUM (PORCINE) 5000 UNIT/ML IJ SOLN
5000.0000 [IU] | Freq: Three times a day (TID) | INTRAMUSCULAR | Status: DC
  Administered 2020-04-08 – 2020-04-23 (×45): 5000 [IU] via SUBCUTANEOUS
  Filled 2020-04-08 (×46): qty 1

## 2020-04-08 MED ORDER — ACETAMINOPHEN 650 MG RE SUPP: 650.0000 mg | Freq: Four times a day (QID) | RECTAL | Status: DC | PRN

## 2020-04-08 MED ORDER — ALBUMIN HUMAN 25 % IV SOLN
12.5000 g | Freq: Once | INTRAVENOUS | Status: AC
  Administered 2020-04-08: 12.5 g via INTRAVENOUS
  Filled 2020-04-08: qty 50

## 2020-04-08 MED ORDER — DILTIAZEM LOAD VIA INFUSION
10.0000 mg | Freq: Once | INTRAVENOUS | Status: AC
  Administered 2020-04-08: 10 mg via INTRAVENOUS
  Filled 2020-04-08: qty 10

## 2020-04-08 MED ORDER — HALOPERIDOL LACTATE 5 MG/ML IJ SOLN
1.0000 mg | Freq: Four times a day (QID) | INTRAMUSCULAR | Status: DC | PRN
  Administered 2020-04-08: 1 mg via INTRAVENOUS
  Filled 2020-04-08: qty 1

## 2020-04-08 MED ORDER — AMLODIPINE BESYLATE 10 MG PO TABS: 10.0000 mg | ORAL_TABLET | Freq: Every day | ORAL | Status: DC

## 2020-04-08 MED ORDER — ACETAMINOPHEN 325 MG PO TABS
650.0000 mg | ORAL_TABLET | Freq: Four times a day (QID) | ORAL | Status: DC | PRN
  Administered 2020-04-09 – 2020-04-16 (×2): 650 mg via ORAL
  Filled 2020-04-08 (×2): qty 2

## 2020-04-08 MED ORDER — CALCITRIOL 0.25 MCG PO CAPS: 0.5000 ug | ORAL_CAPSULE | Freq: Every day | ORAL | Status: DC

## 2020-04-08 MED ORDER — ALBUTEROL SULFATE (2.5 MG/3ML) 0.083% IN NEBU: 2.5000 mg | INHALATION_SOLUTION | RESPIRATORY_TRACT | Status: DC | PRN

## 2020-04-08 MED ORDER — SODIUM BICARBONATE 8.4 % IV SOLN
INTRAVENOUS | Status: DC
  Filled 2020-04-08 (×8): qty 100

## 2020-04-08 MED ORDER — SODIUM BICARBONATE 650 MG PO TABS: 650.0000 mg | ORAL_TABLET | Freq: Three times a day (TID) | ORAL | Status: DC

## 2020-04-08 MED ORDER — ASPIRIN EC 81 MG PO TBEC: 81.0000 mg | DELAYED_RELEASE_TABLET | Freq: Every day | ORAL | Status: DC

## 2020-04-08 NOTE — Progress Notes (Signed)
SLP Cancellation Note  Patient Details Name: Stephanie Griffith MRN: [id number] DOB: 1946/02/28   Cancelled treatment:       Reason Eval/Treat Not Completed: Medical issues which prohibited therapy; nursing deferred d/t pt's mentation; ST will continue efforts as able.   Elvina Sidle, M.S., CCC-SLP 04/08/2020, 1:07 PM

## 2020-04-08 NOTE — Progress Notes (Signed)
PT Cancellation Note  Patient Details Name: Stephanie Griffith MRN: [id number] DOB: May 15, 1945   Cancelled Treatment:    Reason Eval/Treat Not Completed: Patient not medically ready; Per RN pt not appropriate for PT at this time.   Lyanne Co, DPT Acute Rehabilitation Services [phone]   [place] 04/08/2020, 10:32 AM

## 2020-04-08 NOTE — Procedures (Addendum)
Patient Name: Stephanie Griffith  MRN: [id number]  Epilepsy Attending: Lora Havens  Referring Physician/Provider: Dr Bonnielee Haff Date: 04/08/2020 Duration: 22.57 mins  Patient history: 75yo F with COVId-19 and ams. EEG to evaluate for seizure  Level of alertness: lethargic  AEDs during EEG study: None  Technical aspects: This EEG study was done with scalp electrodes positioned according to the 10-20 International system of electrode placement. Electrical activity was acquired at a sampling rate of 500Hz  and reviewed with a high frequency filter of 70Hz  and a low frequency filter of 1Hz . EEG data were recorded continuously and digitally stored.   Description: EEG showed continuous generalized 2-3Hz  delta slowing. Generalized periodic discharges with triphasic morphology at 2hz  were noted. Hyperventilation and photic stimulation were not performed.     ABNORMALITY -Continuous slow, generalized -Periodic discharges with triphasic morphology, generalized  IMPRESSION: This study showed generalized periodic discharges with triphasic morphology at 2hz  which is on the ictal-interictal continuum and can sometimes be seen with toxic-metabolic etiology. There is also  moderate to severe diffuse encephalopathy, nonspecific etiology. No seizures  were seen throughout the recording.  If suspicion for interictal activity remains a concern, a prolonged study can be considered.   Stephanie Griffith

## 2020-04-08 NOTE — Progress Notes (Signed)
Initial Nutrition Assessment  DOCUMENTATION CODES:   Not applicable  INTERVENTION:   - Please obtain admission weight  - RD will monitor for diet advancement and order oral nutrition supplements as appropriate  If unable to advance diet, recommend Cortrak NG tube placement and initiation of enteral nutrition if within [place]. Recommend: - Osmolite 1.2 @ 60 ml/hr (1440 ml/day) - ProSource TF 45 ml daily  Recommended tube feeding regimen would provide 1768 kcal, 91 grams of protein, and 1181 ml of H2O.  - Recommend free water flushes of 125 ml q 4 hours to provide a total of 1931 ml of free water, MD to adjust as needed  NUTRITION DIAGNOSIS:   Inadequate oral intake related to lethargy/confusion as evidenced by NPO status.  GOAL:   Patient will meet greater than or equal to 90% of their needs  MONITOR:   Diet advancement,Labs,Weight trends,I & O's  REASON FOR ASSESSMENT:   Consult Assessment of nutrition requirement/status  ASSESSMENT:   75 year old female who presented to the ED on 1/06 with AMS. PMH of anemia, CHF, CKD stage IV, COPD, HLD, HTN, PAD s/p AKA, atrial fibrillation. Pt with recent admission for COVID pneumonia and encephalopathy and was discharged on 04/04/20.   Pt admitted with acute encephalopathy in the setting of recent COVID-19. 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  Pt is currently NPO. Per SLP note, SLP evaluation deferred by nursing due to pt's mentation.  Unable to obtain diet and weight history at this time. It is likely that pt has had poor PO intake for the last week or so given recent hospital admission and COVID-19 diagnosis.  Per notes, significant stool burden noted on imaging studies. Constipation likely contributing to poor appetite.  Reviewed available weight history in chart. Weight is up to 89 kg from 70 kg in November 2020. Noted weight of 89.1 kg on 03/05/20, 89.8 kg on 03/30/20, and 89.8 kg on 04/06/2020. No admission  weight available. Per RN edema assessment, pt with mild pitting generalized edema.  Medications reviewed and include: cardizem gtt, sodium bicarb @ 75 ml/hr  Labs reviewed: sodium 148, BUN 68, creatinine 4.77, hemoglobin 8.7 CBG's: 80-95 x 24 hours  NUTRITION - FOCUSED PHYSICAL EXAM:  Unable to complete at this time. RD working remotely.  Diet Order:   Diet Order            Diet NPO time specified  Diet effective now                 EDUCATION NEEDS:   Not appropriate for education at this time  Skin:  Skin Assessment: Skin Integrity Issues: Incisions: closed incision to left leg  Last BM:  PTA  Height:   Ht Readings from Last 1 Encounters:  04/04/2020 5\' 8"  (1.727 m)    Weight:   Wt Readings from Last 1 Encounters:  04/15/2020 89.8 kg    BMI:  Body mass index is 30.1 kg/m.  Estimated Nutritional Needs:   Kcal:  2482-5003  Protein:  90-110 grams  Fluid:  1.7-1.9 L    Gustavus Bryant, MS, RD, LDN Inpatient Clinical Dietitian Please see AMiON for contact information.

## 2020-04-08 NOTE — Progress Notes (Signed)
[facility] PROGRESS NOTE   Stephanie Griffith YQI:[id number] DOB: 1946-03-01 DOA: 04/22/2020  PCP: [name], MD  Brief History/Interval Summary: 75 year old African-American female with a past medical history of chronic diastolic CHF, chronic kidney disease stage V, history of COPD, hyperlipidemia, essential hypertension, paroxysmal atrial fibrillation, pulmonary hypertension who was recently hospitalized for pneumonia due to COVID-19 and was discharged on January 3.  She was discharged home per family request.  Recommendation was for her to go to SNF.  Presented back to the hospital with worsening confusion.  Reason for Visit: Acute metabolic encephalopathy  Consultants: None yet  Procedures: None  Antibiotics: Anti-infectives (From admission, onward)   None      Subjective/Interval History: Patient noted to be confused.  Eyes closed.  Moving her arms and legs.  Does not respond to questions.  Unable to obtain any history from her    Assessment/Plan:  Acute encephalopathy in the setting of recent COVID-19 She tested positive on December 29.  During her previous hospitalization she was treated with Remdesivir and steroids.  Respiratory status seems to be stable.  She saturating normal on room air.  Chest x-ray did not show any active process. She was also noted to be encephalopathic during her previous hospitalization.  Imaging studies of the brain include a CT scan done during this admission and MRI brain done last month shows evidence for chronic ischemia.  Patient likely has an element of vascular dementia. Ammonia level noted to be mildly elevated at 58.  Unlikely to be contributing to her current presentation.  LFTs unremarkable. B12 level was noted to be low back in November 2020.  We will recheck.  Unclear if patient takes supplementation. TSH noted to be normal at 1.89 We will do EEG to complete work-up.  Atrial fibrillation with RVR Known history of atrial  fibrillation.  Noted to be in RVR.  Currently requiring diltiazem infusion.  Blood pressure noted to be borderline low.  If heart rate is poorly controlled may need to consider using amiodarone or digoxin.  Due to significant history of anemia she was never placed on anticoagulation previously.  Was on aspirin.  Peripheral artery disease She is status post left AKA.  Seems to be stable currently.  Normocytic anemia Monitor hemoglobin closely.  No evidence of overt bleeding.  Chronic diastolic CHF Monitor volume status.  No evidence for volume overload currently.  Chronic kidney disease stage V/metabolic acidosis Renal function close to baseline.  Monitor urine output.  Bicarbonate level noted to be low.  Anion gap noted to be elevated.  This is most likely due to her kidney disease.  History of pulmonary hypertension Seems to be stable.  Monitor.  Constipation Significant stool burden noted on imaging studies.  She was given enema.  Since she is encephalopathic she is not able to take medications orally.  May need to use more enemas and suppositories.  Leukocytosis Reason unclear.  UA did not show any infection.  Chest x-ray was also unremarkable.  She is afebrile. Procalcitonin was 0.16. Continue to monitor for now.  Could be reactive.  Lactic acidosis Lactic acid was noted to be elevated at 3.2.  Improved to 2.7.    Hypoalbuminemia Likely due to poor nutrition.  Obesity Estimated body mass index is 30.1 kg/m as calculated from the following:   Height as of this encounter: 5\' 8"  (1.727 m).   Weight as of this encounter: 89.8 kg.    DVT Prophylaxis: Subcutaneous heparin Code Status: Full  code Family Communication: Discussed with daughter who was very argumentative.  She was very upset that she could not visit her mother last night.  She was yelling at me over the phone.  She was told to calm down so that I can give her an update. But she again became belligerent and as a result  I had to discontinue my conversation.  I did call the floor charge nurse and asked him to discuss with the director and give a call to the patient's daughter. Disposition Plan: Unclear but likely will need to go to SNF  Status is: Inpatient  Remains inpatient appropriate because:Altered mental status, Ongoing diagnostic testing needed not appropriate for outpatient work up and IV treatments appropriate due to intensity of illness or inability to take PO   Dispo: The patient is from: Home              Anticipated d/c is to: SNF              Anticipated d/c date is: 3 days              Patient currently is not medically stable to d/c.     Medications:  Scheduled: . aspirin EC  81 mg Oral Daily  . heparin  5,000 Units Subcutaneous Q8H   Continuous: . diltiazem (CARDIZEM) infusion 15 mg/hr (04/08/20 0859)   WUX:[id number] **OR** acetaminophen, albuterol, metoprolol tartrate   Objective:  Vital Signs  Vitals:   04/08/20 0810 04/08/20 0910 04/08/20 1000 04/08/20 1056  BP: (!) 130/59 (!) 140/99 (!) 144/71 (!) 152/72  Pulse: (!) 130 (!) 115 (!) 105 (!) 103  Resp: 20 16 18 16   Temp: 98.7 F (37.1 C) 98.8 F (37.1 C) 98.7 F (37.1 C) 97.8 F (36.6 C)  TempSrc: Axillary   Axillary  SpO2: 98% 97% 97% 95%  Weight:      Height:       No intake or output data in the 24 hours ending 04/08/20 1201 Filed Weights   04/16/2020 1632  Weight: 89.8 kg    General appearance: Patient noted to be moving around in the bed.  Does not answer any questions.  Eyes are closed. Resp: Clear to auscultation bilaterally.  Normal effort Cardio: S1-S2 is irregularly irregular and tachycardic.  Telemetry shows atrial fibrillation.  No S3-S4.  No rubs murmurs or bruit GI: Abdomen is soft.  Nontender nondistended.  Bowel sounds are present normal.  No masses organomegaly Extremities: Minimal edema.  Left AKA.  Moving all her extremities. Neurologic: Disoriented.  No obvious focal deficits  noted.   Lab Results:  Data Reviewed: I have personally reviewed following labs and imaging studies  CBC: Recent Labs  Lab 04/02/20 0505 04/03/20 0151 04/25/2020 1316 04/28/2020 1318 04/26/2020 1346 04/16/2020 1347 04/08/20 0725  WBC 9.9 13.8*  --  13.5*  --   --  15.0*  NEUTROABS 8.5* 11.1*  --  9.7*  --   --  10.8*  HGB 8.5* 9.1* 9.9* 10.5* 10.5* 10.5* 8.7*  HCT 27.6* 27.9* 29.0* 34.3* 31.0* 31.0* 28.3*  MCV 92.6 91.2  --  96.6  --   --  94.0  PLT 230 250  --  208  --   --  644    Basic Metabolic Panel: Recent Labs  Lab 04/02/20 0505 04/03/20 0151 04/04/20 0335 04/17/2020 1316 04/08/2020 1318 04/06/2020 1346 04/06/2020 1347 04/08/20 0030 04/08/20 0549 04/08/20 0725  NA 144 146* 143 148* 147* 148* 148*  --   --  148*  K 4.1 4.0 4.5 4.6 5.1 4.3 4.4  --   --  4.4  CL 115* 115* 116*  --  115*  --  117*  --   --  116*  CO2 16* 18* 17*  --  16*  --   --   --   --  11*  GLUCOSE 128* 139* 122*  --  86  --  85  --   --  95  BUN 55* 57* 58*  --  67*  --  64*  --   --  68*  CREATININE 4.57* 4.54* 4.53*  --  4.45*  --  4.60*  --   --  4.77*  CALCIUM 8.2* 8.4* 8.3*  --  8.6*  --   --   --   --  8.6*  MG  --   --   --   --   --   --   --  2.0 2.1  --   PHOS  --   --   --   --   --   --   --   --  4.2  --     GFR: Estimated Creatinine Clearance: 12.1 mL/min (A) (by C-G formula based on SCr of 4.77 mg/dL (H)).  Liver Function Tests: Recent Labs  Lab 04/02/20 0505 04/03/20 0151 04/29/2020 1318 04/08/20 0725  AST 47* 47* 39 42*  ALT 18 22 24 27   ALKPHOS 105 124 118 119  BILITOT 1.1 1.0 0.9 1.0  PROT 7.0 7.3 7.6 7.3  ALBUMIN 2.5* 2.7* 2.8* 2.8*    Recent Labs  Lab 04/08/20 0030  AMMONIA 58*    Coagulation Profile: Recent Labs  Lab 04/08/20 0029  INR 1.1    Cardiac Enzymes: Recent Labs  Lab 04/08/20 0030  CKTOTAL 318*    CBG: Recent Labs  Lab 04/15/2020 1413 05/01/2020 1414 04/08/20 0027 04/08/20 0426 04/08/20 0538  GLUCAP 82 83 80 95 87     Thyroid  Function Tests: Recent Labs    04/08/20 0725  TSH 1.894    Anemia Panel: Recent Labs    04/08/20 0030 04/08/20 0549  FERRITIN 162 143    Recent Results (from the past 240 hour(s))  Blood Culture (routine x 2)     Status: None   Collection Time: 03/30/20  2:00 PM   Specimen: BLOOD  Result Value Ref Range Status   Specimen Description BLOOD LEFT ANTECUBITAL  Final   Special Requests   Final    BOTTLES DRAWN AEROBIC AND ANAEROBIC Blood Culture adequate volume   Culture   Final    NO GROWTH 5 DAYS Performed at [facility], [place] [address] [place], [place] [address]    Report Status 04/04/2020 FINAL  Final  Blood Culture (routine x 2)     Status: None   Collection Time: 03/30/20  2:00 PM   Specimen: BLOOD  Result Value Ref Range Status   Specimen Description BLOOD RIGHT ANTECUBITAL  Final   Special Requests   Final    BOTTLES DRAWN AEROBIC AND ANAEROBIC Blood Culture adequate volume   Culture   Final    NO GROWTH 5 DAYS Performed at [facility], [place] [address] [place], [place] [address]    Report Status 04/04/2020 FINAL  Final  Urine culture     Status: None   Collection Time: 03/30/20  3:22 PM   Specimen: In/Out Cath Urine  Result Value Ref Range Status   Specimen  Description IN/OUT CATH URINE  Final   Special Requests NONE  Final   Culture   Final    NO GROWTH Performed at [facility], [place] [address], [place], [place] [address]    Report Status 03/31/2020 FINAL  Final      Radiology Studies: CT Head Wo Contrast  Result Date: 04/21/2020 CLINICAL DATA:  75 year old female with altered mental status. EXAM: CT HEAD WITHOUT CONTRAST TECHNIQUE: Contiguous axial images were obtained from the base of the skull through the vertex without intravenous contrast. COMPARISON:  Head CT dated 03/30/2020. FINDINGS: Brain: There is mild age-related atrophy and moderate chronic microvascular ischemic changes. 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  There is no acute intracranial hemorrhage. No mass effect or midline shift. No extra-axial fluid collection. Vascular: No hyperdense vessel or unexpected calcification. Skull: Normal. Negative for fracture or focal lesion. Sinuses/Orbits: Diffuse mucoperiosteal thickening of the left maxillary sinus. The remainder of the visualized paranasal sinuses and mastoid air cells are clear. Other: None IMPRESSION: 1. No acute intracranial pathology. 2. Age-related atrophy and chronic microvascular ischemic changes. 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 Electronically Signed   By: Anner Crete M.D.   On: 04/25/2020 17:24   DG Chest Portable 1 View  Result Date: 04/06/2020 CLINICAL DATA:  Altered mental status. EXAM: PORTABLE CHEST 1 VIEW COMPARISON:  Multiple priors, most recent 03/31/2020. FINDINGS: Similar enlarged cardiac silhouette. Low lung volumes. Mild interstitial opacities, favored chronic given similar appearance on prior chest radiograph from 03/02/2020 with normal chest CT from that same day. No consolidation. No visible pleural effusions or pneumothorax. IMPRESSION: 1. No evidence of acute cardiopulmonary disease. 2. Cardiomegaly. Electronically Signed   By: Margaretha Sheffield MD   On: 04/04/2020 13:37   DG Abd Portable 1V  Result Date: 04/29/2020 CLINICAL DATA:  COVID positive with abdominal distension. EXAM: PORTABLE ABDOMEN - 1 VIEW COMPARISON:  None. FINDINGS: The study is limited secondary to the patient's body habitus. The bowel gas pattern is normal. A large amount of stool is seen throughout the colon. No radio-opaque calculi or other significant radiographic abnormality are seen. IMPRESSION: 1. Large stool burden, without evidence of bowel obstruction. Electronically Signed   By: Virgina Norfolk M.D.   On: 05/01/2020 22:23       LOS: 0 days   [facility] Pager on [email or web address]  04/08/2020, 12:01 PM

## 2020-04-08 NOTE — Progress Notes (Signed)
   04/08/20 0530  Seizure Activity  Psychomotor Symptoms Behavior pause  Motor Component Stiff  Duration 20 seconds  Interventions Side-lying Position   Patient experienced stiffness in joints at this time and was placed in side lying position after checking vital signs. Pupils were 3, round, reactive to light with a brisk reaction.

## 2020-04-08 NOTE — Progress Notes (Signed)
Patient continues to hang over side of bed, nurse has placed floor mats down and repositioned patient multiple times. Nurse also placed mittens on patient earlier because patient was trying to pull off O2 probe. Nurse went to check on patient because bed alarm was going off and found that the patient had pulled out one of her IV's and she continues to try to hang over the side og the bed. Will consult with MD and continue to monitor.

## 2020-04-08 NOTE — Progress Notes (Signed)
EEG Completed; Results Pending  

## 2020-04-08 NOTE — Progress Notes (Signed)
I spoke with patients daughter [name] over the phone. She expressed her concerns vividly about driving for six hours and not being able to see her mom last night in the ED or the charge nurse and the director about her mothers condition. MD [place] Pink called Monique this morning to update her about her moms plan of care and answer any she might have. [name] stated she was wanting to visit her mother, which is covid +, and I explained the visiting policy clearly to her in which she said she totally understood. Patient experience number provided to [facility].

## 2020-04-09 ENCOUNTER — Inpatient Hospital Stay (HOSPITAL_COMMUNITY): Payer: Medicare Other

## 2020-04-09 ENCOUNTER — Inpatient Hospital Stay: Payer: Self-pay

## 2020-04-09 DIAGNOSIS — I5032 Chronic diastolic (congestive) heart failure: Secondary | ICD-10-CM | POA: Diagnosis not present

## 2020-04-09 DIAGNOSIS — N185 Chronic kidney disease, stage 5: Secondary | ICD-10-CM | POA: Diagnosis not present

## 2020-04-09 DIAGNOSIS — I4891 Unspecified atrial fibrillation: Secondary | ICD-10-CM | POA: Diagnosis not present

## 2020-04-09 LAB — CBC
HCT: 29.9 % — ABNORMAL LOW (ref 36.0–46.0)
Hemoglobin: 9.1 g/dL — ABNORMAL LOW (ref 12.0–15.0)
MCH: 28.6 pg (ref 26.0–34.0)
MCHC: 30.4 g/dL (ref 30.0–36.0)
MCV: 94 fL (ref 80.0–100.0)
Platelets: 158 10*3/uL (ref 150–400)
RBC: 3.18 MIL/uL — ABNORMAL LOW (ref 3.87–5.11)
RDW: 17.2 % — ABNORMAL HIGH (ref 11.5–15.5)
WBC: 9.7 10*3/uL (ref 4.0–10.5)
nRBC: 0.2 % (ref 0.0–0.2)

## 2020-04-09 LAB — IRON AND TIBC
Iron: 21 ug/dL — ABNORMAL LOW (ref 28–170)
Saturation Ratios: 12 % (ref 10.4–31.8)
TIBC: 179 ug/dL — ABNORMAL LOW (ref 250–450)
UIBC: 158 ug/dL

## 2020-04-09 LAB — BASIC METABOLIC PANEL
Anion gap: 17 — ABNORMAL HIGH (ref 5–15)
BUN: 68 mg/dL — ABNORMAL HIGH (ref 8–23)
CO2: 16 mmol/L — ABNORMAL LOW (ref 22–32)
Calcium: 8.5 mg/dL — ABNORMAL LOW (ref 8.9–10.3)
Chloride: 116 mmol/L — ABNORMAL HIGH (ref 98–111)
Creatinine, Ser: 4.76 mg/dL — ABNORMAL HIGH (ref 0.44–1.00)
GFR, Estimated: 9 mL/min — ABNORMAL LOW (ref >60)
Glucose, Bld: 110 mg/dL — ABNORMAL HIGH (ref 70–99)
Potassium: 4.1 mmol/L (ref 3.5–5.1)
Sodium: 149 mmol/L — ABNORMAL HIGH (ref 135–145)

## 2020-04-09 LAB — FERRITIN: Ferritin: 130 ng/mL (ref 11–307)

## 2020-04-09 LAB — RETICULOCYTES
Immature Retic Fract: 27.8 % — ABNORMAL HIGH (ref 2.3–15.9)
RBC.: 3.12 MIL/uL — ABNORMAL LOW (ref 3.87–5.11)
Retic Count, Absolute: 93.3 10*3/uL (ref 19.0–186.0)
Retic Ct Pct: 3 % (ref 0.4–3.1)

## 2020-04-09 LAB — FOLATE: Folate: 21.2 ng/mL (ref >5.9)

## 2020-04-09 LAB — VITAMIN B12: Vitamin B-12: 272 pg/mL (ref 180–914)

## 2020-04-09 IMAGING — DX DG CHEST 1V PORT
1 series · 1 of 1 positions shown · non-contrast
Comparison: Chest x-ray dated [DATE].

CLINICAL DATA: PICC line placement

EXAM:
PORTABLE CHEST 1 VIEW

[chest ap]
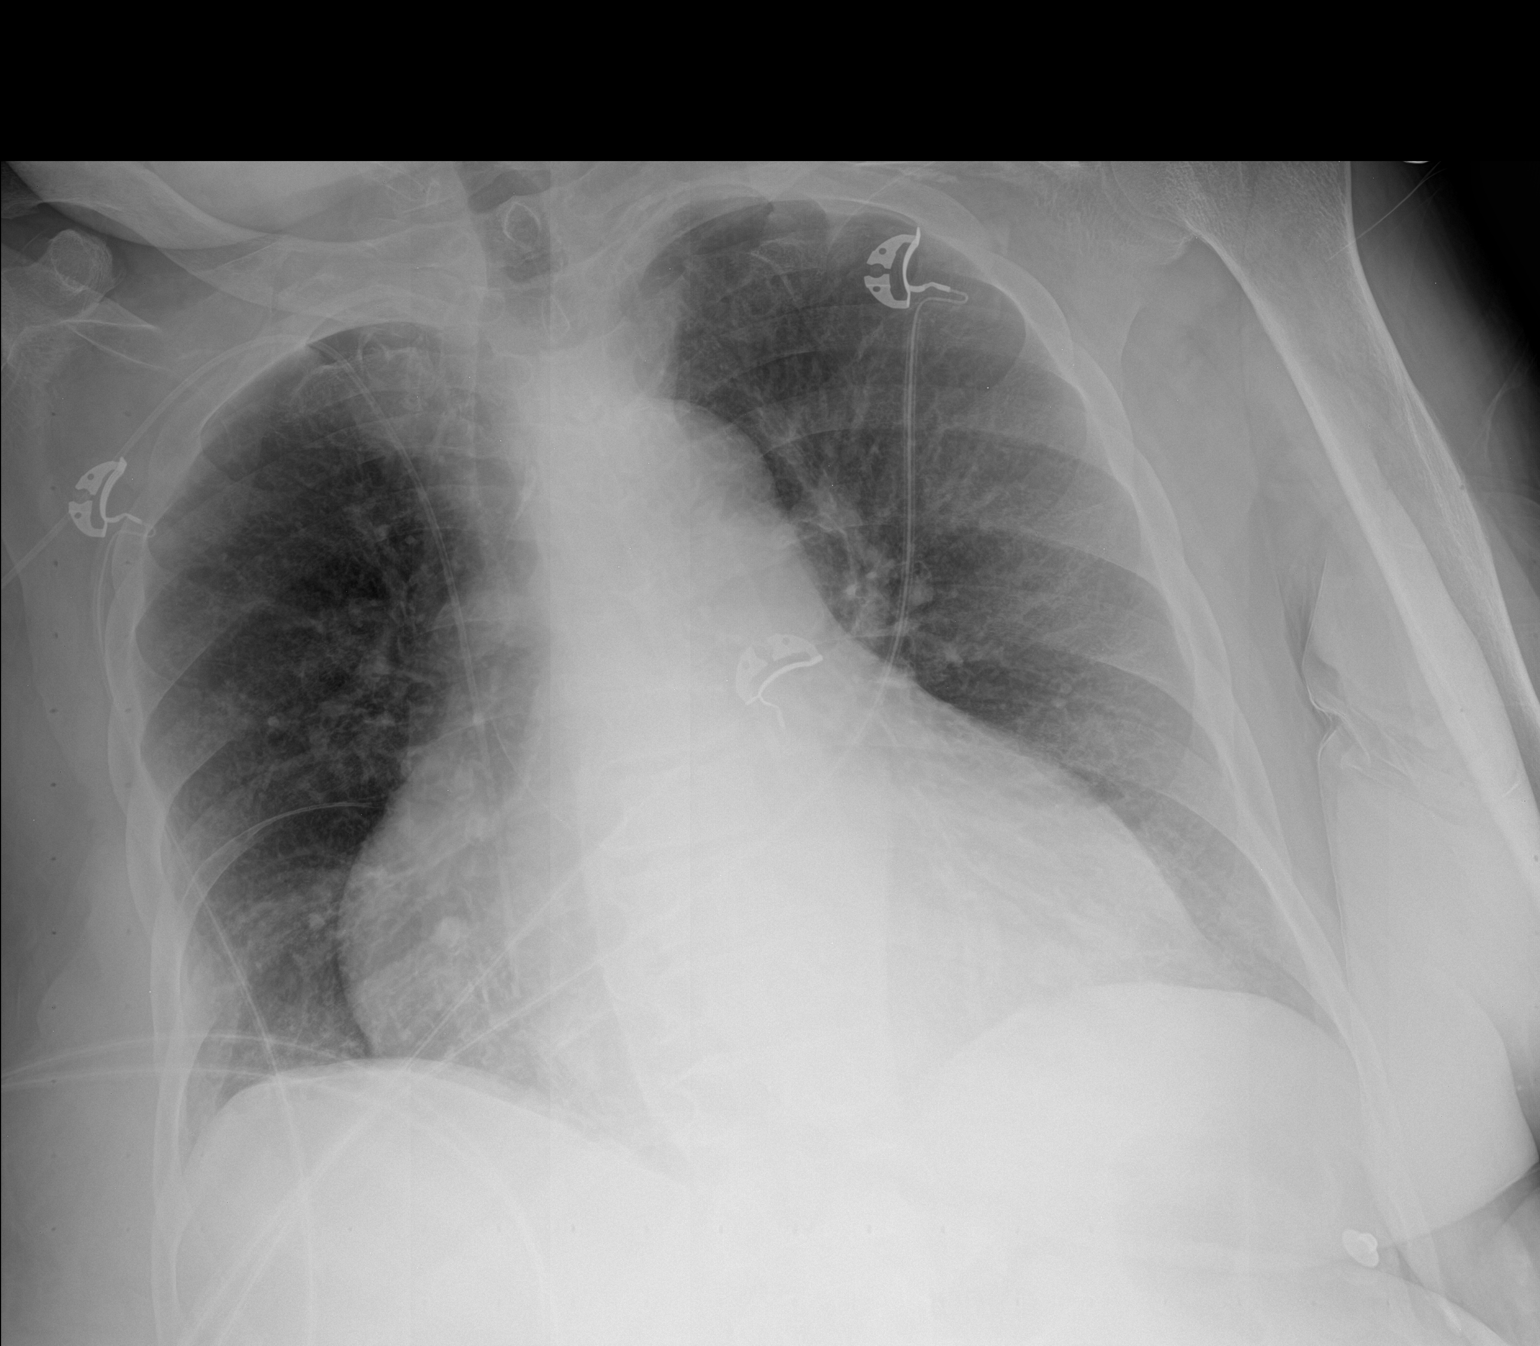

[1 of 1 positions shown; findings below may reference images not displayed]

FINDINGS: Stable cardiomegaly. Lungs are clear. No pleural effusion or
pneumothorax is seen. RIGHT-sided PICC line appears well positioned
with tip at the level the RIGHT atrium.
IMPRESSION: 1. RIGHT-sided PICC line appears well positioned with tip at the
level of the RIGHT atrium.
2. Stable cardiomegaly.
3. No evidence of pneumonia or pulmonary edema.

## 2020-04-09 MED ORDER — ASPIRIN 81 MG PO CHEW
81.0000 mg | CHEWABLE_TABLET | Freq: Every day | ORAL | Status: DC
  Administered 2020-04-09: 81 mg via ORAL
  Filled 2020-04-09: qty 1

## 2020-04-09 MED ORDER — CHLORHEXIDINE GLUCONATE CLOTH 2 % EX PADS
6.0000 | MEDICATED_PAD | Freq: Every day | CUTANEOUS | Status: DC
  Administered 2020-04-09 – 2020-04-22 (×14): 6 via TOPICAL

## 2020-04-09 MED ORDER — LEVETIRACETAM IN NACL 500 MG/100ML IV SOLN
500.0000 mg | Freq: Two times a day (BID) | INTRAVENOUS | Status: DC
  Administered 2020-04-09 – 2020-04-10 (×4): 500 mg via INTRAVENOUS
  Filled 2020-04-09 (×4): qty 100

## 2020-04-09 MED ORDER — CYANOCOBALAMIN 1000 MCG/ML IJ SOLN
1000.0000 ug | Freq: Once | INTRAMUSCULAR | Status: AC
  Administered 2020-04-09: 1000 ug via INTRAMUSCULAR
  Filled 2020-04-09: qty 1

## 2020-04-09 NOTE — Progress Notes (Addendum)
[facility] PROGRESS NOTE   Stephanie Griffith XTG:[id number] DOB: [birth year] DOA: 04/19/2020  PCP: [name], MD  Brief History/Interval Summary: 75 year old African-American female with a past medical history of chronic diastolic CHF, chronic kidney disease stage V, history of COPD, hyperlipidemia, essential hypertension, paroxysmal atrial fibrillation, pulmonary hypertension who was recently hospitalized for pneumonia due to COVID-19 and was discharged on January 3.  She was discharged home per family request.  Recommendation was for her to go to SNF.  Presented back to the hospital with worsening confusion.  Reason for Visit: Acute metabolic encephalopathy  Consultants: None yet  Procedures: None  Antibiotics: Anti-infectives (From admission, onward)   None      Subjective/Interval History: Patient noted to be confused again this morning. Overnight she was quite agitated. Discussed with nursing staff.     Assessment/Plan:  Acute encephalopathy in the setting of recent COVID-19 She tested positive on December 29.  During her previous hospitalization she was treated with Remdesivir and steroids.  She was also noted to be encephalopathic during her previous hospitalization.  Imaging studies of the brain include a CT scan done during this admission and MRI brain done last month shows evidence for chronic ischemia.  Patient likely has an element of vascular dementia. Ammonia level noted to be mildly elevated at 58.  Unlikely to be contributing to her current presentation.  LFTs unremarkable. B12 level was noted to be low back in November 2020. Repeat levels noted to be 272 which is borderline low. She will benefit from supplementation. We'll give her 1 IM injection. Folate level 21.2. TSH noted to be normal at 1.89 EEG was done yesterday. Periodic discharges with triphasic morphology was noted. Per report it is on the ictal/interictal continuum. Patient does have risk factors  to have seizure activity. We'll give her a trial of Keppra.  COVID-19 infection Seems to be stable from COVID-19 standpoint. Respiratory status is stable. She does not have any oxygen requirements. She was treated with Remdesivir and steroid during previous hospitalization. She initially tested positive on December 29. Continue to monitor for now. Can come off of isolation on April 20, 2020.  Atrial fibrillation with RVR Known history of atrial fibrillation.  Noted to be in RVR. Currently on diltiazem infusion with good heart rate control. Hopefully transition to oral soon once she is able to take orally. Due to significant history of anemia she was never placed on anticoagulation previously.  Was on aspirin. Will defer to outpatient providers.  Chronic kidney disease stage V/metabolic acidosis/hypernatremia Renal function close to baseline. Metabolic acidosis improved. Continue bicarbonate infusion. Monitor urine output. Depending on her renal function and urine output may need to involve nephrology. But will watch for now. Patient has previously declined hemodialysis. Daughter asked me if she would be a candidate for peritoneal dialysis. We'll need to address these issues with nephrology at follow-up.  Peripheral artery disease She is status post left AKA.  Seems to be stable currently.  Normocytic anemia Monitor hemoglobin closely.  No evidence of overt bleeding.  Chronic diastolic CHF Monitor volume status.  No evidence for volume overload currently.  History of pulmonary hypertension Seems to be stable.  Monitor.  Constipation Significant stool burden noted on imaging studies.  She was given enema.  Since she is encephalopathic she is not able to take medications orally.  May need to use more enemas and suppositories.  Leukocytosis Reason unclear.  UA did not show any infection.  Chest x-ray was also unremarkable.  She is afebrile. Procalcitonin was 0.16. Possibly reactive. Noted  to be normal this morning.  Lactic acidosis Lactic acid was noted to be elevated at 3.2.  Improved to 2.7.    Hypoalbuminemia Likely due to poor nutrition.  Poor venous access Patient requiring multiple IV medications and fluids. PICC line was considered. She has chronic kidney disease. However she has previously mentioned that she did not want to go on hemodialysis. This was confirmed with the patient's daughter. Okay to place PICC line.  Obesity Estimated body mass index is 30.1 kg/m as calculated from the following:   Height as of this encounter: 5\' 8"  (1.727 m).   Weight as of this encounter: 89.8 kg.    DVT Prophylaxis: Subcutaneous heparin Code Status: Full code Family Communication: Daughter updated today. She was much more courteous this morning.  Disposition Plan: Unclear but likely will need to go to SNF  Status is: Inpatient  Remains inpatient appropriate because:Altered mental status, Ongoing diagnostic testing needed not appropriate for outpatient work up and IV treatments appropriate due to intensity of illness or inability to take PO   Dispo: The patient is from: Home              Anticipated d/c is to: SNF              Anticipated d/c date is: 3 days              Patient currently is not medically stable to d/c.     Medications:  Scheduled: . aspirin  81 mg Oral Daily  . heparin  5,000 Units Subcutaneous Q8H   Continuous: . diltiazem (CARDIZEM) infusion 15 mg/hr (04/08/20 0859)  . levETIRAcetam    . sodium bicarbonate in D5W 1000 mL infusion 75 mL/hr at 04/08/20 1458   HFW:YOVZCHYIFOYDX **OR** acetaminophen, albuterol, bisacodyl, haloperidol, haloperidol lactate, metoprolol tartrate   Objective:  Vital Signs  Vitals:   04/08/20 2057 04/08/20 2327 04/09/20 0329 04/09/20 0727  BP: (!) 125/47 131/73 (!) 156/77 116/60  Pulse:  94 74 87  Resp: 14 15 19 13   Temp: 97.8 F (36.6 C) 98.2 F (36.8 C) 98.5 F (36.9 C) (!) 97.5 F (36.4 C)  TempSrc:  Axillary Axillary Axillary Axillary  SpO2: 94% 91% 90% 97%  Weight:      Height:       No intake or output data in the 24 hours ending 04/09/20 1123 Filed Weights   04/18/2020 1632  Weight: 89.8 kg    General appearance: Agitated. Mittens noted. Does not follow commands. Resp: Clear to auscultation bilaterally.  Normal effort Cardio: S1-S2 is irregularly irregular.  No S3-S4.  No rubs murmurs or bruit. Telemetry shows atrial fibrillation with heart rate in the 80s to 90s. GI: Abdomen is soft.  Nontender nondistended.  Bowel sounds are present normal.  No masses organomegaly Extremities: Minimal edema. Left AKA. Moving all extremities Neurologic: Disoriented. No focal deficits    Lab Results:  Data Reviewed: I have personally reviewed following labs and imaging studies  CBC: Recent Labs  Lab 04/03/20 0151 04/18/2020 1316 04/08/2020 1318 04/03/2020 1346 04/10/2020 1347 04/08/20 0725 04/09/20 0418  WBC 13.8*  --  13.5*  --   --  15.0* 9.7  NEUTROABS 11.1*  --  9.7*  --   --  10.8*  --   HGB 9.1*   < > 10.5* 10.5* 10.5* 8.7* 9.1*  HCT 27.9*   < > 34.3* 31.0* 31.0* 28.3* 29.9*  MCV 91.2  --  96.6  --   --  94.0 94.0  PLT 250  --  208  --   --  187 158   < > = values in this interval not displayed.    Basic Metabolic Panel: Recent Labs  Lab 04/03/20 0151 04/04/20 0335 04/17/2020 1316 04/25/2020 1318 04/28/2020 1346 04/06/2020 1347 04/08/20 0030 04/08/20 0549 04/08/20 0725 04/09/20 0418  NA 146* 143   < > 147* 148* 148*  --   --  148* 149*  K 4.0 4.5   < > 5.1 4.3 4.4  --   --  4.4 4.1  CL 115* 116*  --  115*  --  117*  --   --  116* 116*  CO2 18* 17*  --  16*  --   --   --   --  11* 16*  GLUCOSE 139* 122*  --  86  --  85  --   --  95 110*  BUN 57* 58*  --  67*  --  64*  --   --  68* 68*  CREATININE 4.54* 4.53*  --  4.45*  --  4.60*  --   --  4.77* 4.76*  CALCIUM 8.4* 8.3*  --  8.6*  --   --   --   --  8.6* 8.5*  MG  --   --   --   --   --   --  2.0 2.1  --   --   PHOS  --    --   --   --   --   --   --  4.2  --   --    < > = values in this interval not displayed.    GFR: Estimated Creatinine Clearance: 12.2 mL/min (A) (by C-G formula based on SCr of 4.76 mg/dL (H)).  Liver Function Tests: Recent Labs  Lab 04/03/20 0151 04/30/2020 1318 04/08/20 0725  AST 47* 39 42*  ALT 22 24 27   ALKPHOS 124 118 119  BILITOT 1.0 0.9 1.0  PROT 7.3 7.6 7.3  ALBUMIN 2.7* 2.8* 2.8*    Recent Labs  Lab 04/08/20 0030  AMMONIA 58*    Coagulation Profile: Recent Labs  Lab 04/08/20 0029  INR 1.1    Cardiac Enzymes: Recent Labs  Lab 04/08/20 0030  CKTOTAL 318*    CBG: Recent Labs  Lab 04/19/2020 1413 04/18/2020 1414 04/08/20 0027 04/08/20 0426 04/08/20 0538  GLUCAP 82 83 80 95 87     Thyroid Function Tests: Recent Labs    04/08/20 0725  TSH 1.894    Anemia Panel: Recent Labs    04/08/20 0549 04/09/20 0418  VITAMINB12  --  272  FOLATE  --  21.2  FERRITIN 143 130  TIBC  --  179*  IRON  --  21*  RETICCTPCT  --  3.0    Recent Results (from the past 240 hour(s))  Blood Culture (routine x 2)     Status: None   Collection Time: 03/30/20  2:00 PM   Specimen: BLOOD  Result Value Ref Range Status   Specimen Description BLOOD LEFT ANTECUBITAL  Final   Special Requests   Final    BOTTLES DRAWN AEROBIC AND ANAEROBIC Blood Culture adequate volume   Culture   Final    NO GROWTH 5 DAYS Performed at [facility], [place] [address], [place], [place] [address]    Report Status 04/04/2020 FINAL  Final  Blood Culture (routine x 2)  Status: None   Collection Time: 03/30/20  2:00 PM   Specimen: BLOOD  Result Value Ref Range Status   Specimen Description BLOOD RIGHT ANTECUBITAL  Final   Special Requests   Final    BOTTLES DRAWN AEROBIC AND ANAEROBIC Blood Culture adequate volume   Culture   Final    NO GROWTH 5 DAYS Performed at [facility], [address], [place], [place] [address]    Report Status 04/04/2020 FINAL  Final  Urine  culture     Status: None   Collection Time: 03/30/20  3:22 PM   Specimen: In/Out Cath Urine  Result Value Ref Range Status   Specimen Description IN/OUT CATH URINE  Final   Special Requests NONE  Final   Culture   Final    NO GROWTH Performed at [facility], [place] [address], [place], [place] [address]    Report Status 03/31/2020 FINAL  Final  Culture, blood (single)     Status: None (Preliminary result)   Collection Time: 04/18/2020  1:27 PM   Specimen: BLOOD LEFT FOREARM  Result Value Ref Range Status   Specimen Description BLOOD LEFT FOREARM  Final   Special Requests   Final    BOTTLES DRAWN AEROBIC AND ANAEROBIC Blood Culture results may not be optimal due to an inadequate volume of blood received in culture bottles   Culture   Final    NO GROWTH 2 DAYS Performed at [facility], [place] [address], [place], [place] [address]    Report Status PENDING  Incomplete  Urine culture     Status: None   Collection Time: 04/12/2020  3:02 PM   Specimen: Urine, Random  Result Value Ref Range Status   Specimen Description URINE, RANDOM  Final   Special Requests NONE  Final   Culture   Final    NO GROWTH Performed at [facility], [place] [address], [place], [place] [address]    Report Status 04/08/2020 FINAL  Final      Radiology Studies: CT Head Wo Contrast  Result Date: 05/02/2020 CLINICAL DATA:  75 year old female with altered mental status. EXAM: CT HEAD WITHOUT CONTRAST TECHNIQUE: Contiguous axial images were obtained from the base of the skull through the vertex without intravenous contrast. COMPARISON:  Head CT dated 03/30/2020. FINDINGS: Brain: There is mild age-related atrophy and moderate chronic microvascular ischemic changes. 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 There is no acute intracranial hemorrhage. No mass effect or midline shift. No extra-axial fluid collection. Vascular: No hyperdense vessel or unexpected calcification. Skull: Normal. Negative  for fracture or focal lesion. Sinuses/Orbits: Diffuse mucoperiosteal thickening of the left maxillary sinus. The remainder of the visualized paranasal sinuses and mastoid air cells are clear. Other: None IMPRESSION: 1. No acute intracranial pathology. 2. Age-related atrophy and chronic microvascular ischemic changes. 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 Electronically Signed   By: Anner Crete M.D.   On: 04/20/2020 17:24   DG Chest Portable 1 View  Result Date: 05/02/2020 CLINICAL DATA:  Altered mental status. EXAM: PORTABLE CHEST 1 VIEW COMPARISON:  Multiple priors, most recent 03/31/2020. FINDINGS: Similar enlarged cardiac silhouette. Low lung volumes. Mild interstitial opacities, favored chronic given similar appearance on prior chest radiograph from 03/02/2020 with normal chest CT from that same day. No consolidation. No visible pleural effusions or pneumothorax. IMPRESSION: 1. No evidence of acute cardiopulmonary disease. 2. Cardiomegaly. Electronically Signed   By: Margaretha Sheffield MD   On: 05/02/2020 13:37  DG Abd Portable 1V  Result Date: 04/10/2020 CLINICAL DATA:  COVID positive with abdominal distension. EXAM: PORTABLE ABDOMEN - 1 VIEW COMPARISON:  None. FINDINGS: The study is limited secondary to the patient's body habitus. The bowel gas pattern is normal. A large amount of stool is seen throughout the colon. No radio-opaque calculi or other significant radiographic abnormality are seen. IMPRESSION: 1. Large stool burden, without evidence of bowel obstruction. Electronically Signed   By: Virgina Norfolk M.D.   On: 04/20/2020 22:23   EEG adult  Result Date: 04/08/2020 Lora Havens, MD     04/08/2020  4:12 PM Patient Name: Stephanie Griffith MRN: [id number] Epilepsy Attending: Lora Havens Referring Physician/Provider: Dr Bonnielee Haff Date: 04/08/2020 Duration: 22.57 mins Patient history: 75yo F with COVId-19 and ams. EEG to evaluate for seizure Level of alertness: lethargic  AEDs during EEG study: None Technical aspects: This EEG study was done with scalp electrodes positioned according to the 10-20 International system of electrode placement. Electrical activity was acquired at a sampling rate of 500Hz  and reviewed with a high frequency filter of 70Hz  and a low frequency filter of 1Hz . EEG data were recorded continuously and digitally stored. Description: EEG showed continuous generalized 2-3Hz  delta slowing. Generalized periodic discharges with triphasic morphology at 2hz  were noted. Hyperventilation and photic stimulation were not performed.   ABNORMALITY -Continuous slow, generalized -Periodic discharges with triphasic morphology, generalized IMPRESSION: This study showed generalized periodic discharges with triphasic morphology at 2hz  which is on the ictal-interictal continuum and can sometimes be seen with toxic-metabolic etiology. There is also  moderate to severe diffuse encephalopathy, nonspecific etiology. No seizures  were seen throughout the recording. If suspicion for interictal activity remains a concern, a prolonged study can be considered. Priyanka O Yadav   [place] EKG SITE RITE  Result Date: 04/09/2020 If Site Rite image not attached, placement could not be confirmed due to current cardiac rhythm.      LOS: 1 day   Abbigail Anstey [employer] on [email or web address]  04/09/2020, 11:23 AM

## 2020-04-09 NOTE — Evaluation (Signed)
Clinical/Bedside Swallow Evaluation Patient Details  Name: Stephanie Griffith MRN: [id number] Date of Birth: [birth year]  Today's Date: 04/09/2020 Time: SLP Start Time (ACUTE ONLY): 0950 SLP Stop Time (ACUTE ONLY): 1010 SLP Time Calculation (min) (ACUTE ONLY): 20 min  Past Medical History:  Past Medical History:  Diagnosis Date  . Anemia of chronic disease   . Chronic diastolic CHF (congestive heart failure) ([place])   . Chronic renal insufficiency, stage IV (severe) ([place])   . COPD (chronic obstructive pulmonary disease) ([place])   . Hyperglycemia   . Hyperlipidemia   . Hypertension   . PAD (peripheral artery disease) ([place])    a. s/p femoral to below knee popliteal bypass then L AKA.  Marland Kitchen PAF (paroxysmal atrial fibrillation) ([place])   . PAT (paroxysmal atrial tachycardia) ([place])   . Pulmonary hypertension ([place])   . Umbilical hernia    Past Surgical History:  Past Surgical History:  Procedure Laterality Date  . AMPUTATION  06/06/2011   Procedure: AMPUTATION DIGIT;  Surgeon: Elam Dutch, MD;  Location: [facility] OR;  Service: Vascular;  Laterality: Left;  Transmetatarsal amputation left great toe  . AMPUTATION  06/12/2011   Procedure: AMPUTATION ABOVE KNEE;  Surgeon: Elam Dutch, MD;  Location: [facility];  Service: Vascular;  Laterality: Left;  . FEMORAL-POPLITEAL BYPASS GRAFT  06/06/2011   Procedure: BYPASS GRAFT FEMORAL-POPLITEAL ARTERY;  Surgeon: Elam Dutch, MD;  Location: [facility] OR;  Service: Vascular;  Laterality: Left;  left femoral to popliteal bypass with composite vein and propaten 64mm x 80 graft  . LOWER EXTREMITY ANGIOGRAM N/A 06/04/2011   Procedure: LOWER EXTREMITY ANGIOGRAM;  Surgeon: Angelia Mould, MD;  Location: [facility] CATH LAB;  Service: Cardiovascular;  Laterality: N/A;  . OMENTECTOMY  12/31/2015   Procedure: Partial OMENTECTOMY;  Surgeon: Georganna Skeans, MD;  Location: [facility];  Service: General;;  . RIGHT HEART CATH N/A 02/13/2019   Procedure: RIGHT HEART CATH;  Surgeon: Jolaine Artist, MD;  Location: [facility] CV LAB;  Service: Cardiovascular;  Laterality: N/A;  . TUBAL LIGATION    . VENTRAL HERNIA REPAIR N/A 12/31/2015   Procedure: REPAIR INCARCERATED VENTRAL HERNIA, POSSIBLE BOWEL RESECTION;  Surgeon: Georganna Skeans, MD;  Location: [facility];  Service: General;  Laterality: N/A;   HPI:  Stephanie Griffith is a 75 y.o. female with medical history significant of anemia, chronic diastolic CHF, chronic renal sufficiency stage IV, COPD, HLD, HTN, PAD, paroxysmal atrial fibrillation, pulmonary hypertension.  COVID-19 symptoms began around 12/29.  Pt was recently discharged from hospital on 04/04/20 and returned on 04/09/2020 secondary to confusion and decreased PO intake.   Assessment / Plan / Recommendation Clinical Impression  Pt was seen for a bedside swallow evaluation and she presents with oral dysphagia, likely secondary to current AMS.  Pt was encountered awake, but confused.  She was oriented to herself, but she was not oriented to place, situation, or date.  Pt was unable to follow commands in order to complete oral mechanism examination, but she was agreeable to PO trials.  She consumed thin liquid via tsp and straw sip.  She consumed >4 oz via continuous straw sip without overt s/sx of aspiration or difficulty.  Belching was noted x1.  Pt additionally consumed puree without difficulty.  When pt was presented with a regular solid, she held it in her oral cavity with decreased mastication and eventual expectoration of the bolus observed.  Suspect that this is mostly attributable to current mentation vs oral motor weakness.  Recommend initiation of Dysphagia 1 (puree) solids and thin liquids with medications administered crushed in puree.  SLP will f/u to monitor diet tolerance and to determine readiness for clinical diet upgrade.  SLP Visit Diagnosis: Dysphagia, oral phase (R13.11)    Aspiration Risk  Mild aspiration risk    Diet Recommendation Dysphagia 1 (Puree);Thin liquid    Liquid Administration via: Cup;Straw Medication Administration: Crushed with puree Supervision: Staff to assist with self feeding;Full supervision/cueing for compensatory strategies Compensations: Minimize environmental distractions;Slow rate;Small sips/bites    Other  Recommendations Oral Care Recommendations: Oral care BID;Staff/trained caregiver to provide oral care   Follow up Recommendations Skilled Nursing facility;24 hour supervision/assistance      Frequency and Duration min 2x/week  2 weeks       Prognosis Prognosis for Safe Diet Advancement: Good Barriers to Reach Goals: Other (Comment) (current AMS)      Swallow Study   General HPI: Stephanie Griffith is a 75 y.o. female with medical history significant of anemia, chronic diastolic CHF, chronic renal sufficiency stage IV, COPD, HLD, HTN, PAD, paroxysmal atrial fibrillation, pulmonary hypertension.  COVID-19 symptoms began around 12/29.  Pt was recently discharged from hospital on 04/04/20 and returned on 04/18/2020 secondary to confusion and decreased PO intake. Type of Study: Bedside Swallow Evaluation Diet Prior to this Study: NPO Temperature Spikes Noted: No Respiratory Status: Room air History of Recent Intubation: No Behavior/Cognition: Alert;Distractible;Confused Oral Cavity Assessment: Dried secretions Oral Care Completed by SLP: No Oral Cavity - Dentition: Poor condition Self-Feeding Abilities: Needs assist Patient Positioning: Upright in bed Baseline Vocal Quality: Normal Volitional Cough: Cognitively unable to elicit Volitional Swallow: Unable to elicit    Oral/Motor/Sensory Function Overall Oral Motor/Sensory Function: Other (comment) (Pt unable to follow commands)   Ice Chips Ice chips: Not tested   Thin Liquid Thin Liquid: Within functional limits Presentation: Straw;Spoon    Nectar Thick Nectar Thick Liquid: Not tested   Honey Thick Honey Thick Liquid: Not tested   Puree Puree: Within functional  limits Presentation: Spoon   Solid     Solid: Impaired Presentation: Spoon Oral Phase Impairments: Impaired mastication;Reduced lingual movement/coordination Oral Phase Functional Implications: Impaired mastication;Oral holding     Colin Mulders., M.S., CCC-SLP Acute Rehabilitation Services Office: [phone]  Elvia Collum Agapita Savarino 04/09/2020,10:35 AM

## 2020-04-09 NOTE — Progress Notes (Signed)
Peripherally Inserted Central Catheter Placement  The IV Nurse has discussed with the patient and/or persons authorized to consent for the patient, the purpose of this procedure and the potential benefits and risks involved with this procedure.  The benefits include less needle sticks, lab draws from the catheter, and the patient may be discharged home with the catheter. Risks include, but not limited to, infection, bleeding, blood clot (thrombus formation), and puncture of an artery; nerve damage and irregular heartbeat and possibility to perform a PICC exchange if needed/ordered by physician.  Alternatives to this procedure were also discussed.  Bard Power PICC patient education guide, fact sheet on infection prevention and patient information card has been provided to patient /or left at bedside. Consent obtained from dtr by staff nurse, [place].   PICC Placement Documentation  PICC Triple Lumen 03/49/17 PICC Right Basilic 40 cm 1 cm (Active)  Indication for Insertion or Continuance of Line Vasoactive infusions;Administration of hyperosmolar/irritating solutions (i.e. TPN, Vancomycin, etc.) 04/09/20 1541  Exposed Catheter (cm) 1 cm 04/09/20 1541  Site Assessment Clean;Dry;Intact 04/09/20 1541  Lumen #1 Status Flushed;Saline locked;Blood return noted 04/09/20 1541  Lumen #2 Status Flushed;Saline locked;Blood return noted 04/09/20 1541  Lumen #3 Status Flushed;Saline locked;Blood return noted 04/09/20 1541  Dressing Type Transparent 04/09/20 1541  Dressing Status Clean;Dry;Intact 04/09/20 1541  Antimicrobial disc in place? Yes 04/09/20 1541  Safety Lock Not Applicable 91/50/56 [id number]  Line Care Connections checked and tightened 04/09/20 1541  Line Adjustment (NICU/IV Team Only) No 04/09/20 1541  Dressing Intervention New dressing 04/09/20 1541  Dressing Change Due 04/16/20 04/09/20 1541       Rolena Infante 04/09/2020, 3:42 PM

## 2020-04-09 NOTE — Evaluation (Signed)
Occupational Therapy Evaluation Patient Details Name: Stephanie Griffith MRN: [id number] DOB: 06-21-1945 Today's Date: 04/09/2020    History of Present Illness This 75 y.o. femaled admitted with worsening confusion.  She was recently admitted and discharged home on  1/3.  Dx:  [id number], Acute metabolic encephalopathy.  PMH includes:  dementia, chronic diastolic CHF, CKD stage V, h/o COPD, essential HTN, paroxysmal A-Fib, pulmonary HTN, s/p Lt AKA   Clinical Impression   Pt admitted with above. She demonstrates the below listed deficits and will benefit from continued OT to maximize safety and independence with BADLs.  Pt presents to OT with generalized weakness, decreased activity tolerance, significantly impaired cognition including decreased arousal, ability to follow commands, orientation.  She currently required total A +2 for bed mobility, she required up to max A to maintain EOB sitting with brief periods of min guard assist.  She required mod A to drink from a cup, but otherwise required total A for all other ADLs.  Per chart, she was living at home with family.   Anticipate she will require SNF level rehab.        Follow Up Recommendations  SNF;Supervision/Assistance - 24 hour    Equipment Recommendations  Hospital bed    Recommendations for Other Services       Precautions / Restrictions Precautions Precautions: Fall Precaution Comments: Per notes from last admission: L AKA, prosthesis no longer fits has not worn for a long time.      Mobility Bed Mobility Overal bed mobility: Needs Assistance Bed Mobility: Supine to Sit;Sit to Supine     Supine to sit: Total assist;+2 for physical assistance;+2 for safety/equipment Sit to supine: Total assist;+2 for physical assistance;+2 for safety/equipment   General bed mobility comments: requires assist for all aspects    Transfers                 General transfer comment: pt unable to attempt    Balance Overall balance  assessment: Needs assistance Sitting-balance support: Feet supported;Single extremity supported Sitting balance-Leahy Scale: Poor Sitting balance - Comments: Pt requires up to max A with periods of min guard assist                                   ADL either performed or assessed with clinical judgement   ADL Overall ADL's : Needs assistance/impaired Eating/Feeding: Maximal assistance Eating/Feeding Details (indicate cue type and reason): max A to drink from cup.  Requires assist to initiate task and to move cup fully to her lips Grooming: Wash/dry face;Total assistance;Sitting Grooming Details (indicate cue type and reason): required hand over hand assist.  She did not attempt to take over, or perform activity Upper Body Bathing: Total assistance;Bed level;Sitting   Lower Body Bathing: Total assistance;Bed level   Upper Body Dressing : Total assistance;Bed level;Sitting   Lower Body Dressing: Total assistance;Bed level   Toilet Transfer: Total assistance   Toileting- Clothing Manipulation and Hygiene: Total assistance       Functional mobility during ADLs: Total assistance;+2 for physical assistance (bed mobility)       Vision   Additional Comments: Pt will open eyes and makes eye contact with therapist for short periods of time     Perception     Praxis      Pertinent Vitals/Pain Pain Assessment: Faces Faces Pain Scale: No hurt     Hand Dominance Left   Extremity/Trunk Assessment Upper  Extremity Assessment Upper Extremity Assessment: Generalized weakness;LUE deficits/detail LUE Deficits / Details: Pt appears to have limited elbow extension passively ~-40* LUE Coordination: decreased gross motor;decreased fine motor   Lower Extremity Assessment Lower Extremity Assessment: Defer to PT evaluation   Cervical / Trunk Assessment Cervical / Trunk Assessment: Normal   Communication Communication Communication: Other (comment) (minimal  interaction)   Cognition Arousal/Alertness: Lethargic Behavior During Therapy: Flat affect Overall Cognitive Status: Impaired/Different from baseline Area of Impairment: Attention;Following commands;Problem solving                 Orientation Level: Disoriented to;Time;Place;Situation Current Attention Level: Focused   Following Commands: Follows one step commands inconsistently;Follows one step commands with increased time     Problem Solving: Decreased initiation;Slow processing;Difficulty sequencing;Requires verbal cues;Requires tactile cues General Comments: Pt lethargic.  Keeps eyes closed majority of the time, but with max stimuli, she will open them briefly.  She tells me her name is Brennan Karam, and she will occasionally initiate following commands but frequently looses attention and unable to complete command   General Comments  02 sats >89% on RA    Exercises     Shoulder Instructions      Home Living Family/patient expects to be discharged to:: Private residence Living Arrangements: Children Available Help at Discharge: Available PRN/intermittently Type of Home: Apartment Home Access: Stairs to enter Entrance Stairs-Number of Steps: 4 Entrance Stairs-Rails: Right;Left;Can reach both Home Layout: One level     Bathroom Shower/Tub: Teacher, early years/pre: Handicapped height Bathroom Accessibility: Yes How Accessible: Accessible via wheelchair Home Equipment: Wheelchair - manual;Bedside commode   Additional Comments: Pt unable to [employer].  Information gleaned from chart review      Prior Functioning/Environment Level of Independence: Needs assistance        Comments: pt unable to provide info.  Will need to determine PLOF since last admission and family's ability to assist pt        OT Problem List: Decreased strength;Decreased range of motion;Decreased activity tolerance;Impaired balance (sitting and/or standing);Impaired  vision/perception;Decreased cognition;Decreased coordination;Decreased safety awareness;Decreased knowledge of use of DME or AE;Cardiopulmonary status limiting activity;Impaired UE functional use      OT Treatment/Interventions: Self-care/ADL training;Therapeutic exercise;Neuromuscular education;DME and/or AE instruction;Manual therapy;Therapeutic activities;Cognitive remediation/compensation;Patient/family education;Balance training    OT Goals(Current goals can be found in the care plan section) Acute Rehab OT Goals OT Goal Formulation: With patient Time For Goal Achievement: 2020-05-01 Potential to Achieve Goals: Fair ADL Goals Pt Will Perform Grooming: with mod assist;sitting Pt Will Transfer to Toilet: with max assist;with +2 assist;with transfer board;bedside commode;squat pivot transfer Additional ADL Goal #1: Pt will sustain attention to simple grooming tasks x 3 mins with mod cues Additional ADL Goal #2: Pt will wash face with mod A  OT Frequency: Min 2X/week   Barriers to D/C: Decreased caregiver support  unsure if family able to provide amount of assistance/care pt currently requires       Co-evaluation PT/OT/SLP Co-Evaluation/Treatment: Yes Reason for Co-Treatment: Necessary to address cognition/behavior during functional activity;For patient/therapist safety;To address functional/ADL transfers   OT goals addressed during session: ADL's and self-care      AM-PAC OT "6 Clicks" Daily Activity     Outcome Measure Help from another person eating meals?: Total Help from another person taking care of personal grooming?: Total Help from another person toileting, which includes using toliet, bedpan, or urinal?: Total Help from another person bathing (including washing, rinsing, drying)?: Total Help from another person to  put on and taking off regular upper body clothing?: Total Help from another person to put on and taking off regular lower body clothing?: Total 6 Click Score:  6   End of Session Nurse Communication: Mobility status  Activity Tolerance: Patient limited by lethargy Patient left: in bed;with call bell/phone within reach;with bed alarm set;with restraints reapplied  OT Visit Diagnosis: Cognitive communication deficit (G40.102)                Time: 7253-6644 OT Time Calculation (min): 43 min Charges:  OT General Charges $OT Visit: 1 Visit OT Evaluation $OT Eval Moderate Complexity: 1 Mod OT Treatments $Therapeutic Activity: 8-22 mins  Nilsa Nutting., OTR/L Acute Rehabilitation Services Pager [phone] Office [phone]   Lucille Passy M 04/09/2020, 12:52 PM

## 2020-04-09 NOTE — Evaluation (Signed)
Physical Therapy Evaluation Patient Details Name: Stephanie Griffith MRN: [id number] DOB: 22-Apr-1945 Today's Date: 04/09/2020   History of Present Illness  Pt is a 75 y.o. female with a medical hx significant for pulmonary HTN, L AKA, PAT, PAF, PAD, HTN, hyperglycemia, COPD, chronic renal insufficiency stage IV, CHF, and anemia who presents with increased confusion and decreased Po instake since her ecent admission for COVID pneumonia and encephalopathy (D/C'd 04/04/20). Suspect underlying dementia with sundowning. Pt admitted for acute encephalopathy due to COVID-19. EEG showed: generalized periodic discharges with triphasic morphology at 2hz  which is on the ictal-interictal continuum and can sometimes be seen with toxic-metabolic etiology, moderate to severe diffuse encephalopathy with nonspecific etiology, and no seizures were seen. 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  Clinical Impression  Pt admitted with above diagnosis and deficits mentioned below, see PT Problem List. Per chart, she was living at home with family. Unable to obtain hx from pt due to impaired cognition and decreased arousal this date. Pt presents with generalized weakness, decreased activity tolerance, significantly impaired cognition including decreased arousal, ability to follow commands, and orientation. She minimally moves her R leg, even with noxious stimuli. She displays stiffness limiting PROM into R knee extension and R ankle plantarflexion also. She currently requires total A +2 for bed mobility, she required up to max A to maintain EOB sitting with brief periods of min guard assist. Anticipate she will require SNF level rehab. Will continue to follow acutely.    Follow Up Recommendations SNF;Supervision/Assistance - 24 hour    Equipment Recommendations  Hospital bed (if does not have one already)    Recommendations for Other Services       Precautions /  Restrictions Precautions Precautions: Fall Precaution Comments: Per notes from last admission: L AKA, prosthesis no longer fits has not worn for a long time. Restrictions Weight Bearing Restrictions: Yes LLE Weight Bearing: Non weight bearing      Mobility  Bed Mobility Overal bed mobility: Needs Assistance Bed Mobility: Supine to Sit;Sit to Supine     Supine to sit: Total assist;+2 for physical assistance;+2 for safety/equipment Sit to supine: Total assist;+2 for physical assistance;+2 for safety/equipment   General bed mobility comments: requires assist for all aspects    Transfers                 General transfer comment: pt unable to attempt  Ambulation/Gait                Stairs            Wheelchair Mobility    Modified Rankin (Stroke Patients Only)       Balance Overall balance assessment: Needs assistance Sitting-balance support: Feet supported;Single extremity supported Sitting balance-Leahy Scale: Poor Sitting balance - Comments: Pt requires up to max A with periods of min guard assist. Leans to the R. Postural control: Right lateral lean                                   Pertinent Vitals/Pain Pain Assessment: Faces Faces Pain Scale: No hurt    Home Living Family/patient expects to be discharged to:: Private residence Living Arrangements: Children Available Help at Discharge: Available PRN/intermittently Type of Home: Apartment Home Access: Stairs to enter Entrance Stairs-Rails: Right;Left;Can reach both Entrance Stairs-Number of Steps: 4 Home Layout: One level Home Equipment: Wheelchair - manual;Bedside commode Additional  Comments: Pt unable to provid info.  Information gleaned from chart review    Prior Function Level of Independence: Needs assistance         Comments: pt unable to provide info.  Will need to determine PLOF since last admission and family's ability to assist pt     Hand Dominance    Dominant Hand: Left    Extremity/Trunk Assessment   Upper Extremity Assessment Upper Extremity Assessment: Defer to OT evaluation LUE Deficits / Details: Pt appears to have limited elbow extension passively ~-40* LUE Coordination: decreased gross motor;decreased fine motor    Lower Extremity Assessment Lower Extremity Assessment: Generalized weakness;RLE deficits/detail RLE Deficits / Details: right leg is weak, dry, dark lower leg, unable to lift against gravity, no reaction to light and deep pressure on foot but slight pull back with toe extension PROM, limited PROM knee extension to ~ -15', limited PROM ankle plantarflexion to only ~10' (unable to follow commands to formally assess) LLE Deficits / Details: h/o L AKA, moves, but not against gravity (unable to follow commands to formally assess)    Cervical / Trunk Assessment Cervical / Trunk Assessment: Normal  Communication   Communication: Other (comment) (minimal interaction)  Cognition Arousal/Alertness: Lethargic Behavior During Therapy: Flat affect Overall Cognitive Status: Impaired/Different from baseline Area of Impairment: Attention;Following commands;Problem solving                 Orientation Level: Disoriented to;Time;Place;Situation Current Attention Level: Focused   Following Commands: Follows one step commands inconsistently;Follows one step commands with increased time     Problem Solving: Decreased initiation;Slow processing;Difficulty sequencing;Requires verbal cues;Requires tactile cues General Comments: Pt lethargic.  Keeps eyes closed majority of the time, but with max stimuli, she will open them briefly.  She tells me her name is Stephanie Griffith, and she will occasionally initiate following commands but frequently loses attention and unable to complete command      General Comments General comments (skin integrity, edema, etc.): 02 sats >89% on RA    Exercises     Assessment/Plan    PT  Assessment Patient needs continued PT services  PT Problem List Decreased strength;Decreased activity tolerance;Decreased balance;Decreased mobility;Decreased cognition;Decreased knowledge of use of DME;Decreased safety awareness;Cardiopulmonary status limiting activity;Obesity;Decreased range of motion;Decreased coordination       PT Treatment Interventions DME instruction;Functional mobility training;Therapeutic activities;Balance training;Therapeutic exercise;Cognitive remediation;Patient/family education;Wheelchair mobility training;Neuromuscular re-education    PT Goals (Current goals can be found in the Care Plan section)  Acute Rehab PT Goals Patient Stated Goal: pt did not state PT Goal Formulation: Patient unable to participate in goal setting Time For Goal Achievement: 2020-05-17 Potential to Achieve Goals: Fair    Frequency Min 3X/week   Barriers to discharge Decreased caregiver support unsure if family able to provide amount of assistance/care pt currently requires    Co-evaluation PT/OT/SLP Co-Evaluation/Treatment: Yes Reason for Co-Treatment: Necessary to address cognition/behavior during functional activity;For patient/therapist safety;To address functional/ADL transfers PT goals addressed during session: Mobility/safety with mobility;Balance OT goals addressed during session: ADL's and self-care       AM-PAC PT "6 Clicks" Mobility  Outcome Measure Help needed turning from your back to your side while in a flat bed without using bedrails?: Total Help needed moving from lying on your back to sitting on the side of a flat bed without using bedrails?: Total Help needed moving to and from a bed to a chair (including a wheelchair)?: Total Help needed standing up from a chair using your arms (  e.g., wheelchair or bedside chair)?: Total Help needed to walk in hospital room?: Total Help needed climbing 3-5 steps with a railing? : Total 6 Click Score: 6    End of Session    Activity Tolerance: Patient limited by fatigue;Patient limited by lethargy Patient left: in bed;with call bell/phone within reach;with bed alarm set;Other (comment) (mittens donned bilat)   PT Visit Diagnosis: Muscle weakness (generalized) (M62.81)    Time: 5396-7289 PT Time Calculation (min) (ACUTE ONLY): 38 min   Charges:   PT Evaluation $PT Eval Moderate Complexity: 1 Mod          Moishe Spice, PT, DPT Acute Rehabilitation Services  Pager: [phone] Office: [phone]   Orvan Falconer 04/09/2020, 1:13 PM

## 2020-04-09 NOTE — Progress Notes (Signed)
CXR shows PICC in right atrium.  Dr Quintella Reichert, radiologist, recommends to retract PICC 6 cm to be in SVC/CAJ.  Dr Maryland Pink notified via securechat.  New orders for PICC exchange 04/10/20, with PICC assistance due to difficulty with pt cooperation this pm, and retract PICC for tonight. Will recommend RN to protect PICC from pt pulling out.

## 2020-04-10 ENCOUNTER — Inpatient Hospital Stay (HOSPITAL_COMMUNITY): Payer: Medicare Other

## 2020-04-10 DIAGNOSIS — I4891 Unspecified atrial fibrillation: Secondary | ICD-10-CM | POA: Diagnosis not present

## 2020-04-10 DIAGNOSIS — N185 Chronic kidney disease, stage 5: Secondary | ICD-10-CM | POA: Diagnosis not present

## 2020-04-10 DIAGNOSIS — I5032 Chronic diastolic (congestive) heart failure: Secondary | ICD-10-CM | POA: Diagnosis not present

## 2020-04-10 LAB — BLOOD GAS, ARTERIAL
Acid-base deficit: 4.5 mmol/L — ABNORMAL HIGH (ref 0.0–2.0)
Bicarbonate: 18.7 mmol/L — ABNORMAL LOW (ref 20.0–28.0)
Drawn by: 44135
FIO2: 36
O2 Saturation: 96.9 %
Patient temperature: 37
pCO2 arterial: 26.3 mmHg — ABNORMAL LOW (ref 32.0–48.0)
pH, Arterial: 7.464 — ABNORMAL HIGH (ref 7.350–7.450)
pO2, Arterial: 92.9 mmHg (ref 83.0–108.0)

## 2020-04-10 LAB — CBC
HCT: 23 % — ABNORMAL LOW (ref 36.0–46.0)
Hemoglobin: 7.5 g/dL — ABNORMAL LOW (ref 12.0–15.0)
MCH: 29.5 pg (ref 26.0–34.0)
MCHC: 32.6 g/dL (ref 30.0–36.0)
MCV: 90.6 fL (ref 80.0–100.0)
Platelets: 154 10*3/uL (ref 150–400)
RBC: 2.54 MIL/uL — ABNORMAL LOW (ref 3.87–5.11)
RDW: 17.1 % — ABNORMAL HIGH (ref 11.5–15.5)
WBC: 14.6 10*3/uL — ABNORMAL HIGH (ref 4.0–10.5)
nRBC: 0.3 % — ABNORMAL HIGH (ref 0.0–0.2)

## 2020-04-10 LAB — BASIC METABOLIC PANEL
Anion gap: 12 (ref 5–15)
BUN: 61 mg/dL — ABNORMAL HIGH (ref 8–23)
CO2: 22 mmol/L (ref 22–32)
Calcium: 7.3 mg/dL — ABNORMAL LOW (ref 8.9–10.3)
Chloride: 108 mmol/L (ref 98–111)
Creatinine, Ser: 4.52 mg/dL — ABNORMAL HIGH (ref 0.44–1.00)
GFR, Estimated: 10 mL/min — ABNORMAL LOW (ref >60)
Glucose, Bld: 276 mg/dL — ABNORMAL HIGH (ref 70–99)
Potassium: 2.6 mmol/L — CL (ref 3.5–5.1)
Sodium: 142 mmol/L (ref 135–145)

## 2020-04-10 LAB — AMMONIA: Ammonia: 74 umol/L — ABNORMAL HIGH (ref 9–35)

## 2020-04-10 LAB — POTASSIUM: Potassium: 3.3 mmol/L — ABNORMAL LOW (ref 3.5–5.1)

## 2020-04-10 LAB — GLUCOSE, CAPILLARY: Glucose-Capillary: 107 mg/dL — ABNORMAL HIGH (ref 70–99)

## 2020-04-10 IMAGING — DX DG CHEST 1V PORT
1 series · 1 of 1 positions shown · non-contrast
Comparison: Radiograph [RF]

CLINICAL DATA: Dyspnea

EXAM:
PORTABLE CHEST 1 VIEW

[chest]
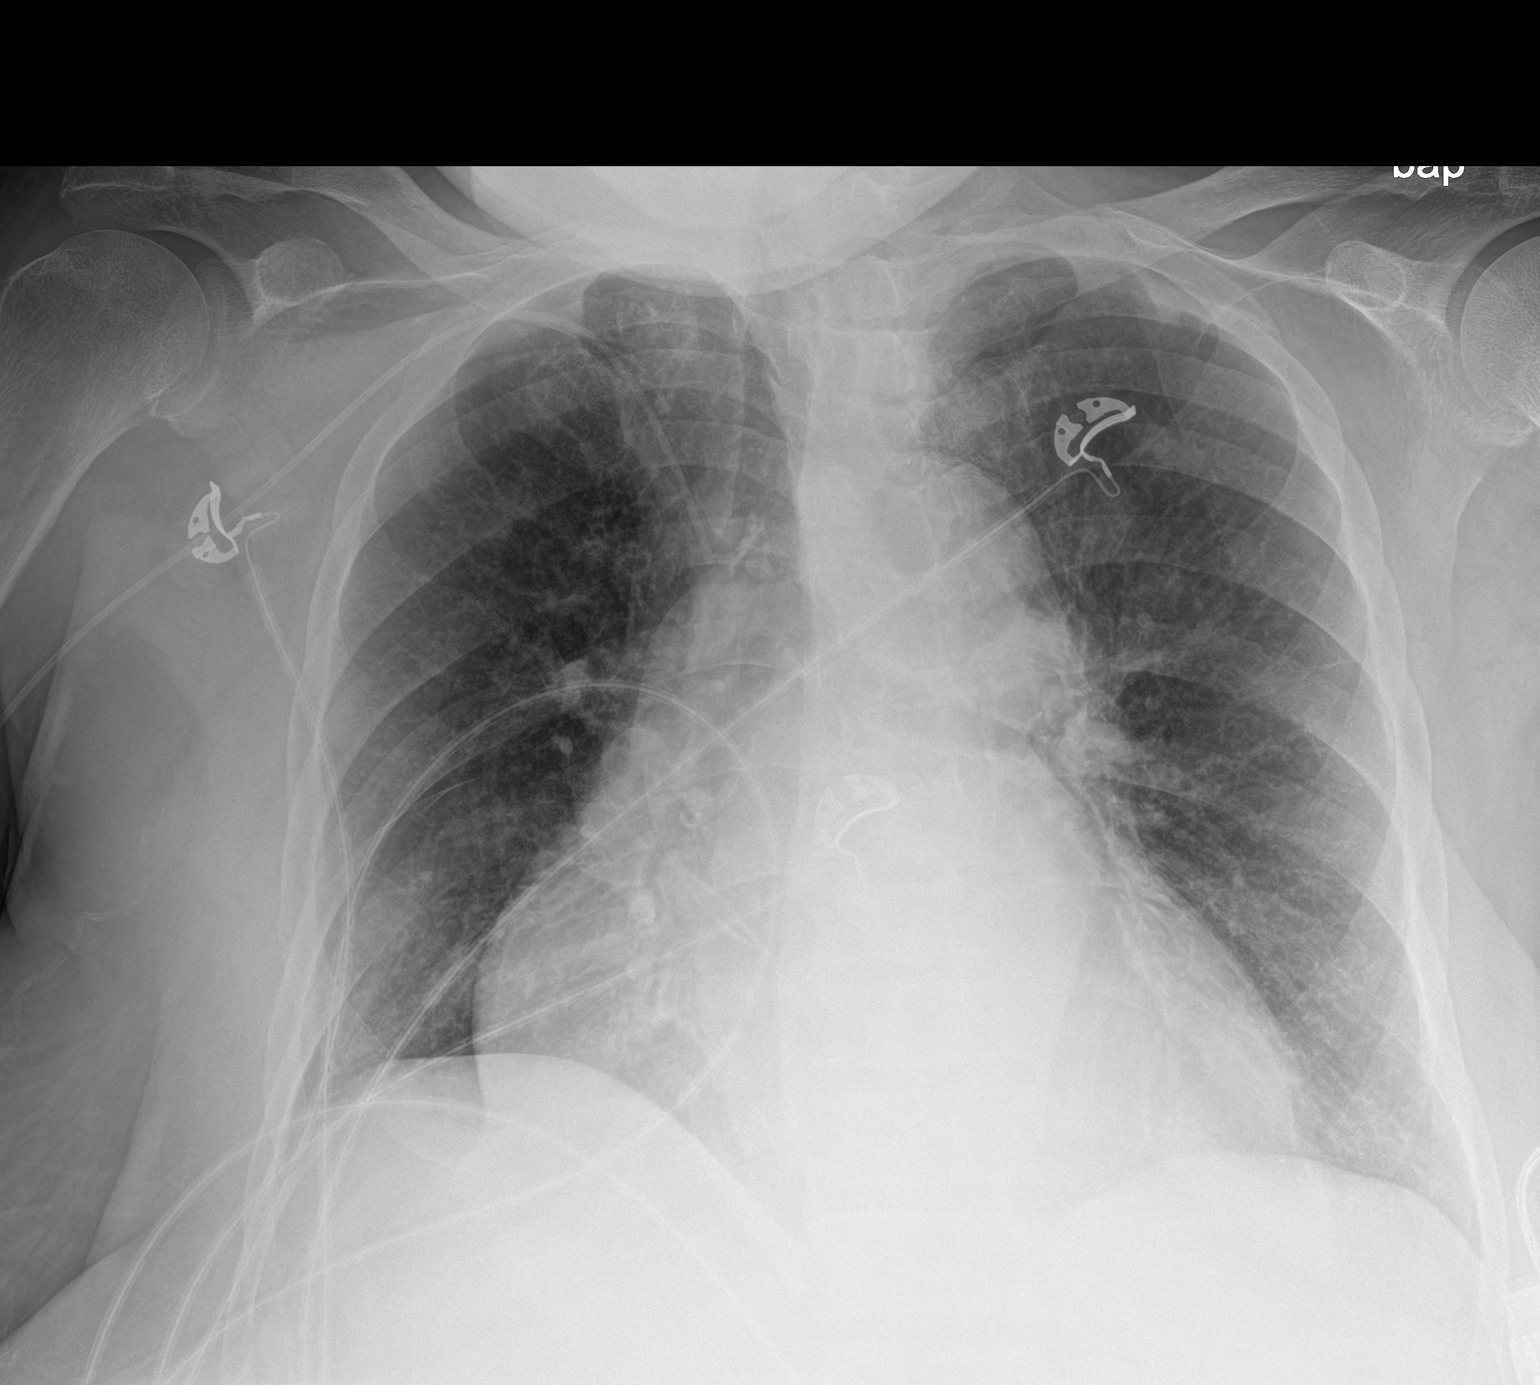

[1 of 1 positions shown; findings below may reference images not displayed]

FINDINGS: Right upper extremity PICC has been retracted with the tip
positioned at the level of the mid SVC. Telemetry leads overlie the
chest. Redemonstrated cardiomegaly. Atelectatic changes are again
seen without lungs without new area of focal consolidation.
Additional hazy and reticular opacities could be part of the
atelectatic change or reflect mild edema or atypical infection. No
pneumothorax or visible effusion. No acute osseous or soft tissue
abnormality.
IMPRESSION: Right upper extremity PICC has been retracted with the tip
positioned at the level of the mid SVC.

Low volumes and atelectasis.

Some additional hazy and reticular opacity, could be atelectatic
versus mild edema or atypical infection. Correlate with clinical
status.

## 2020-04-10 IMAGING — DX DG CHEST 1V PORT
1 series · 1 of 1 positions shown · non-contrast
Comparison: [DATE]

CLINICAL DATA: Status post PICC line placement

EXAM:
PORTABLE CHEST 1 VIEW

[chest ap]
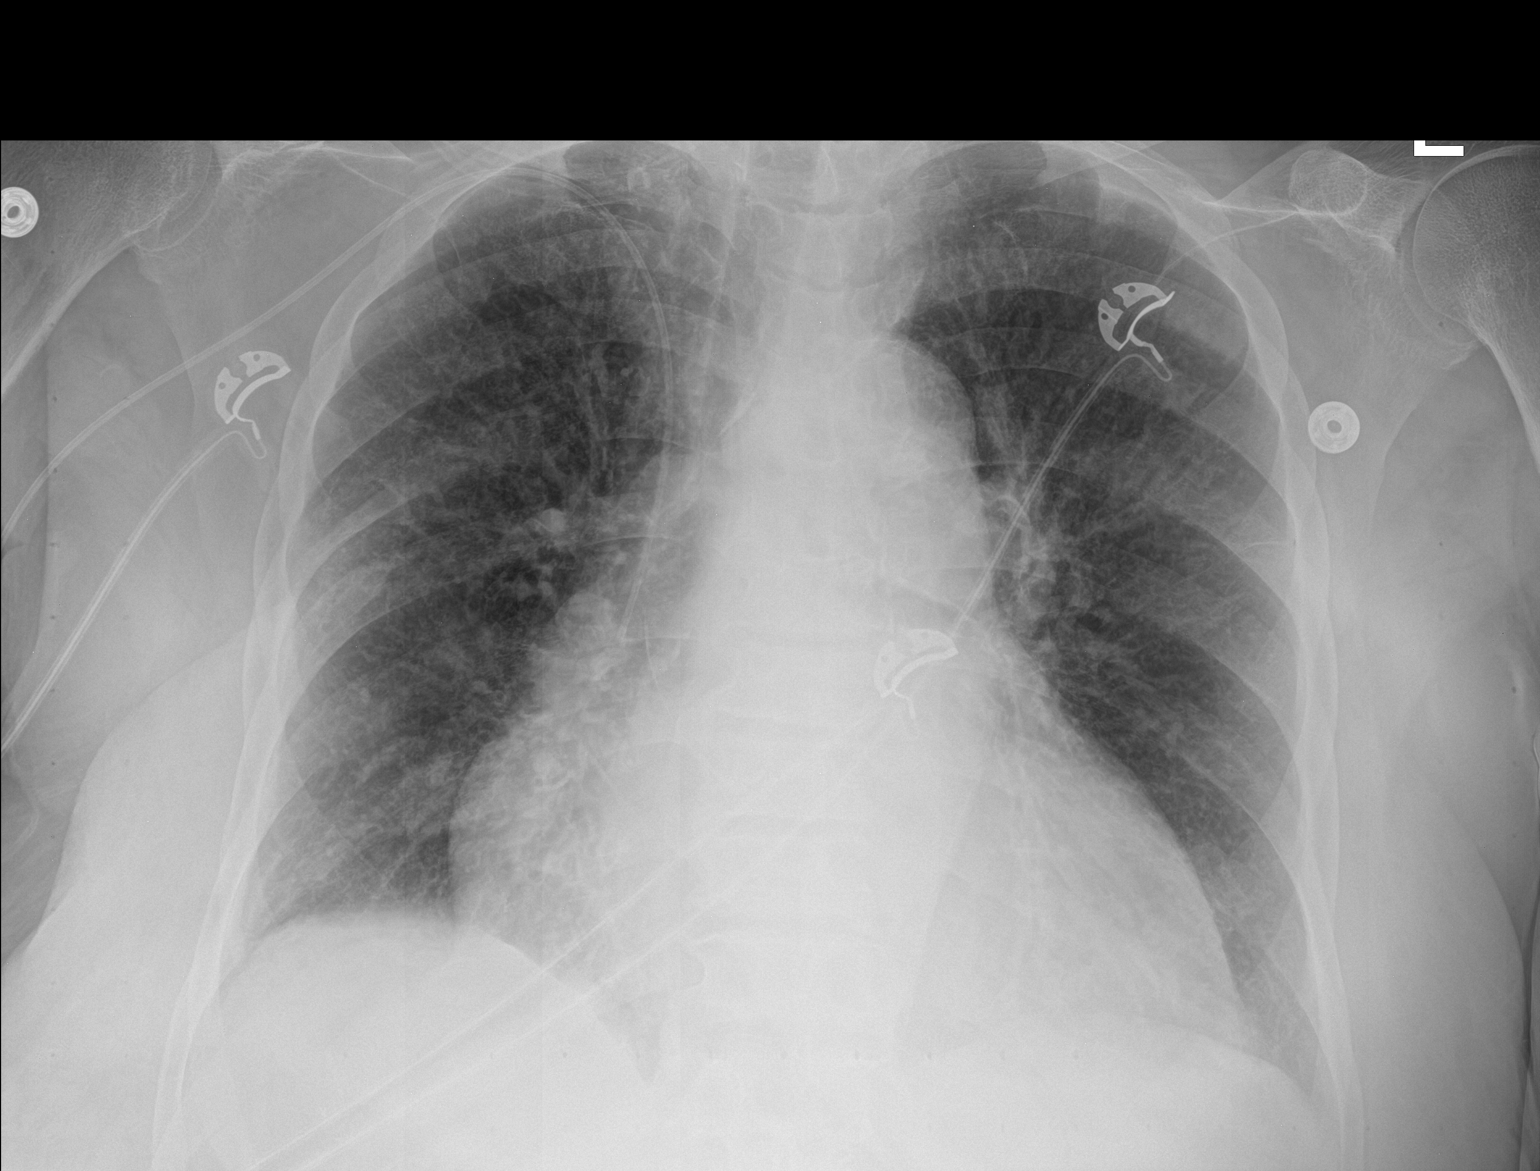

[1 of 1 positions shown; findings below may reference images not displayed]

FINDINGS: The right PICC line is been advanced terminating in the central SVC.
Stable cardiomegaly. The hila and mediastinum normal. Pulmonary
venous congestion/mild edema persists. No focal infiltrate. No other
acute abnormalities.
IMPRESSION: Interval advancement of right PICC line into the central SVC.

## 2020-04-10 IMAGING — CT CT HEAD W/O CM
3 series · 14 of 47 positions shown, 16 images · non-contrast
Comparison: Prior CT from [DATE].

CLINICAL DATA: Initial evaluation for acute delirium.

EXAM:
CT HEAD WITHOUT CONTRAST
TECHNIQUE: Contiguous axial images were obtained from the base of the skull
through the vertex without intravenous contrast.

[Series 2: head 5.0 h30s · axial · 0.46mm/px · z∈[-141,+4]mm · 8 of 35 slices shown, 10 images]
[im 3/35  brain]
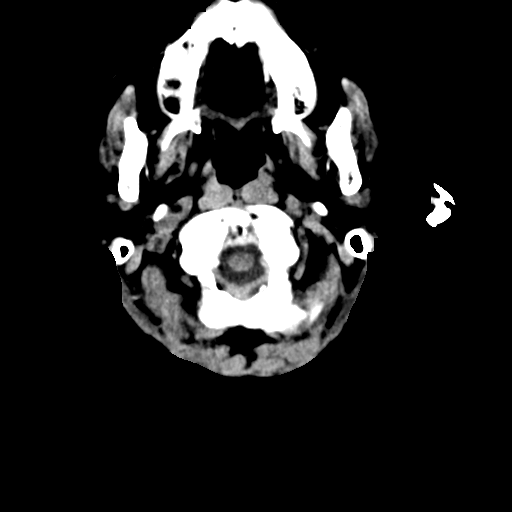
[im 3/35  bone]
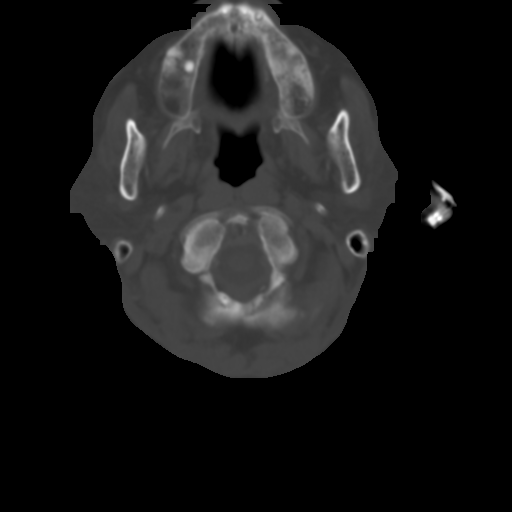
[im 8/35  brain]
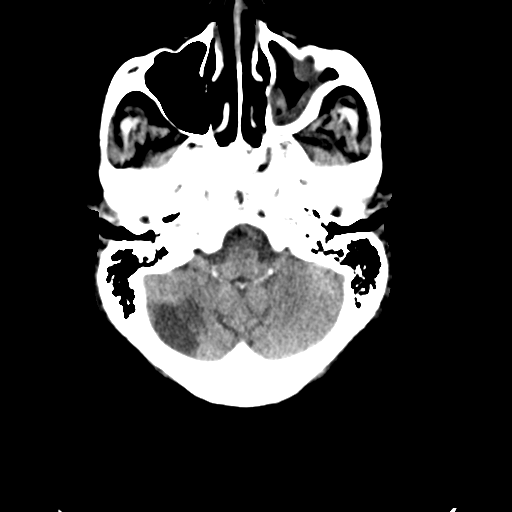
[im 11/35  brain]
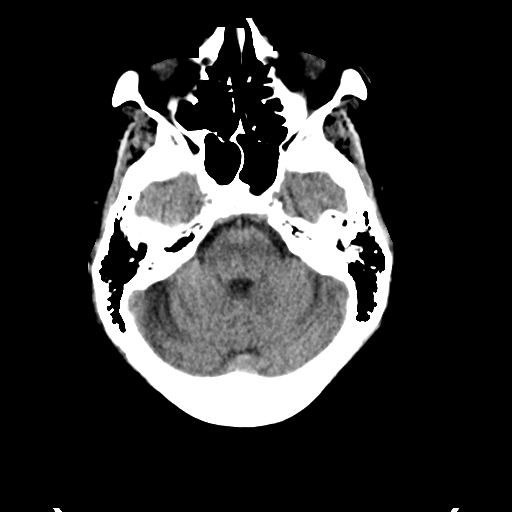
[im 16/35  brain]
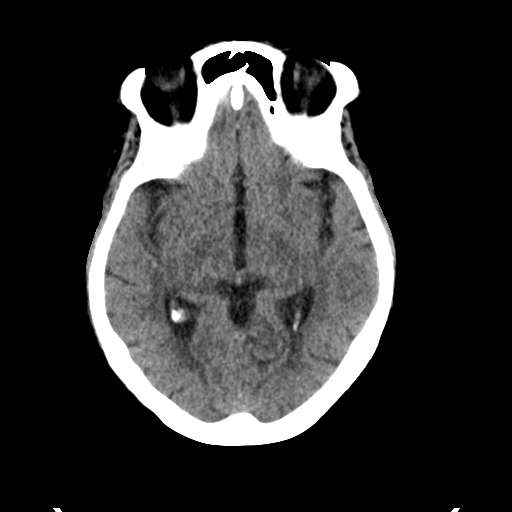
[im 19/35  brain]
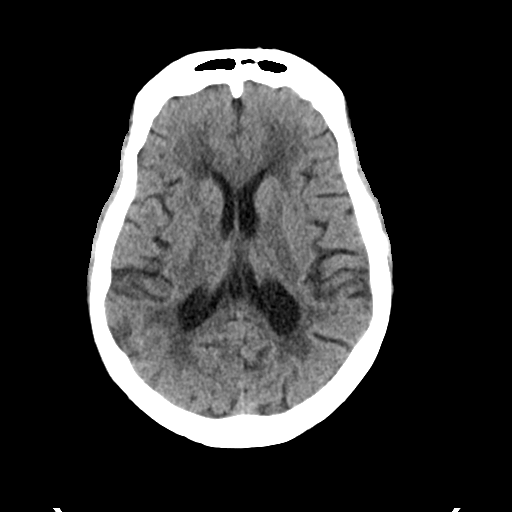
[im 19/35  bone]
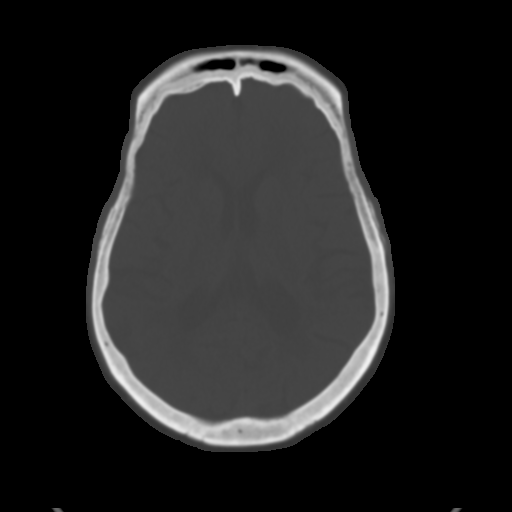
[im 24/35  brain]
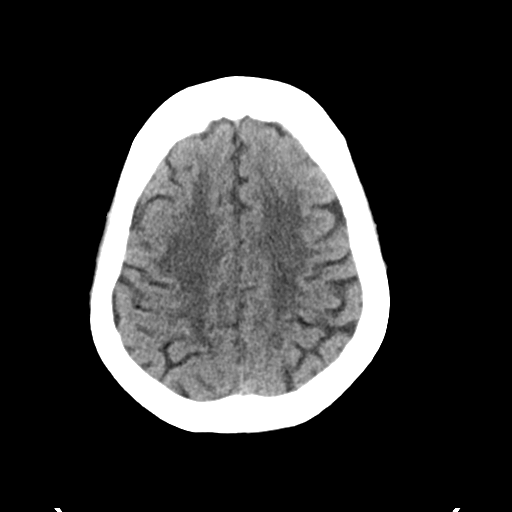
[im 27/35  brain]
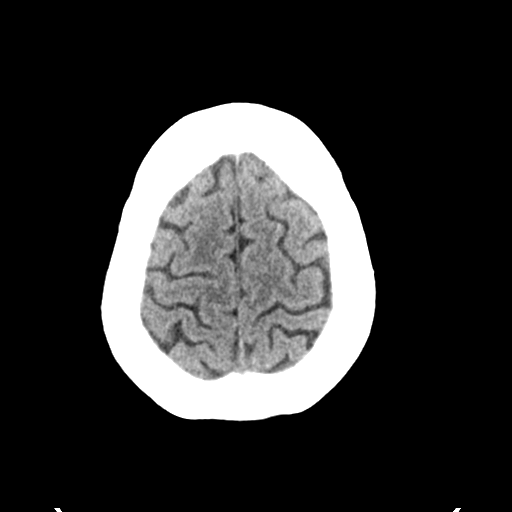
[im 32/35  brain]
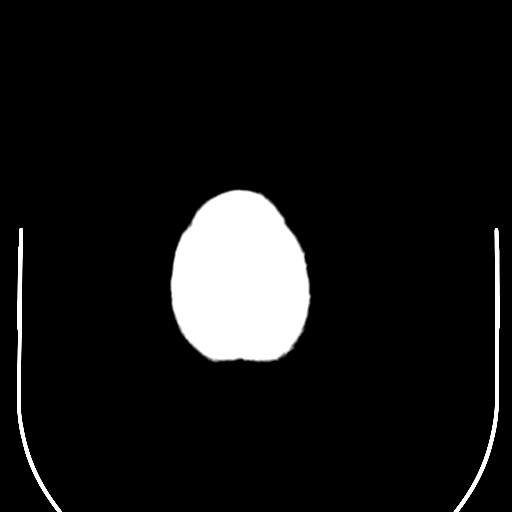

[Series 4: head 3.0 mpr cor · coronal · 0.42mm/px · 3 of 81 slices shown]
[im 27/81  brain]
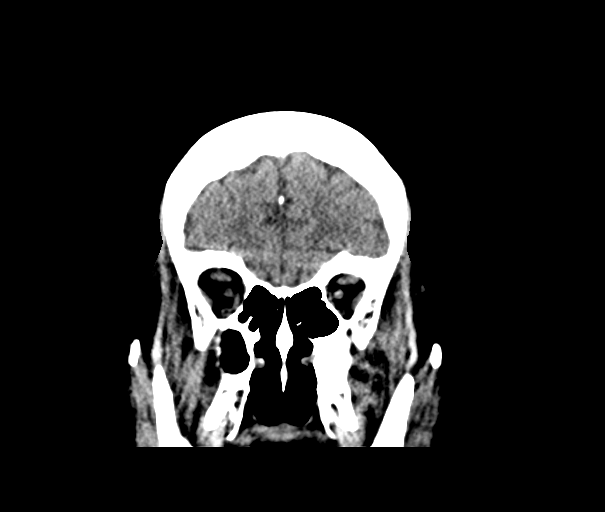
[im 36/81  brain]
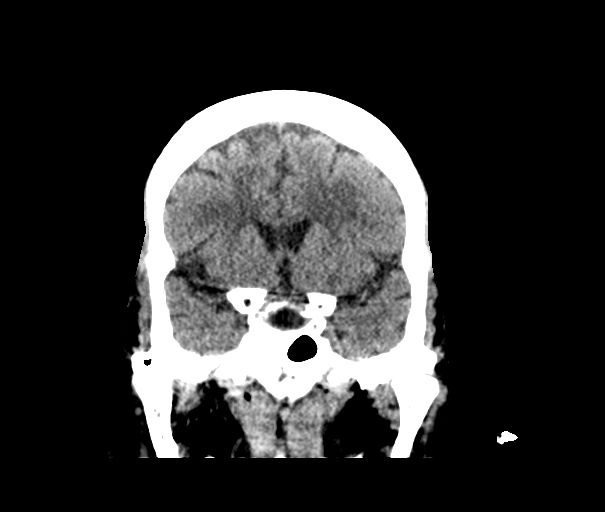
[im 45/81  brain]
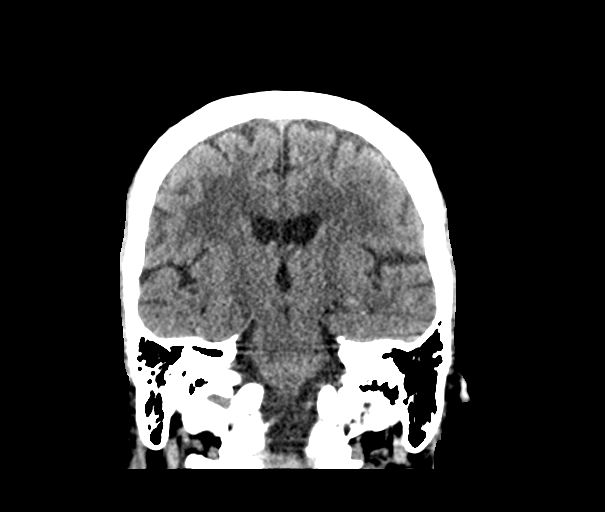

[Series 5: head 3.0 mpr sag · sagittal · 0.34mm/px · 3 of 66 slices shown]
[im 22/66  brain]
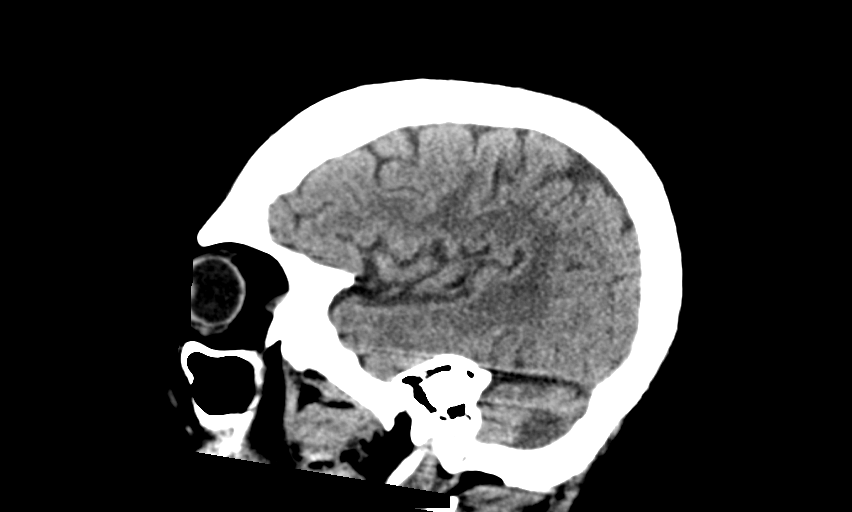
[im 33/66  brain]
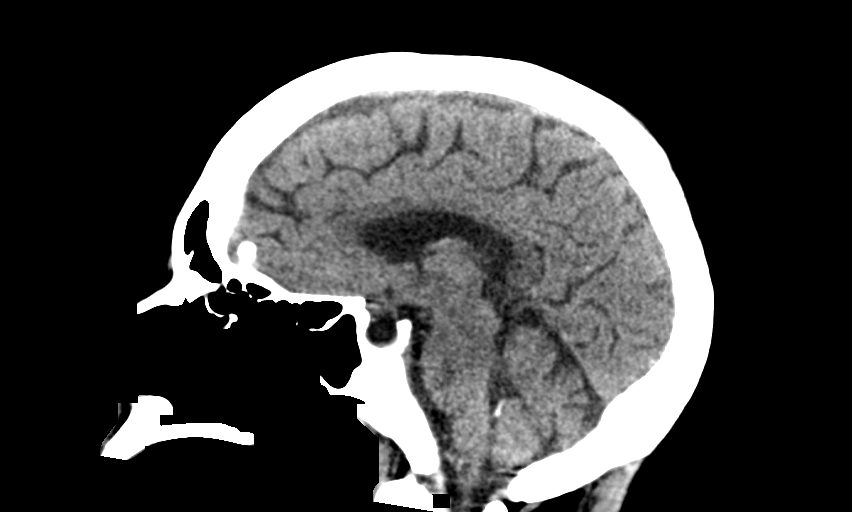
[im 44/66  brain]
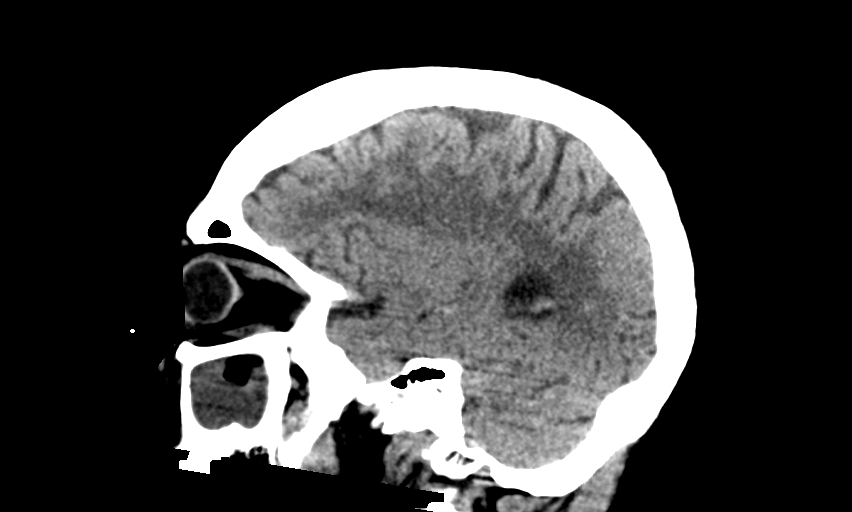

[14 of 47 positions shown; findings below may reference images not displayed]

FINDINGS: Brain: Stable atrophy with moderate chronic microvascular ischemic
disease. Remote right cerebellar infarct.

No acute intracranial hemorrhage. No acute large vessel territory
infarct. No mass lesion, midline shift or mass effect. No
hydrocephalus or extra-axial fluid collection.

Vascular: No hyperdense vessel. Calcified atherosclerosis at the
skull base.

Skull: Scalp soft tissues and calvarium within normal limits.

Sinuses/Orbits: Globes and orbital soft tissues demonstrate no acute
finding. Chronic left maxillary sinusitis noted. Paranasal sinuses
are otherwise largely clear. No mastoid effusion.

Other: None.
IMPRESSION: 1. No acute intracranial abnormality.
2. Stable atrophy with moderate chronic microvascular ischemic
disease.
3. Remote right cerebellar infarct.
4. Chronic left maxillary sinusitis.

## 2020-04-10 MED ORDER — POTASSIUM CHLORIDE 10 MEQ/100ML IV SOLN
INTRAVENOUS | Status: AC
  Administered 2020-04-10: 10 meq via INTRAVENOUS
  Filled 2020-04-10: qty 100

## 2020-04-10 MED ORDER — FUROSEMIDE 10 MG/ML IJ SOLN
40.0000 mg | Freq: Once | INTRAMUSCULAR | Status: AC
  Administered 2020-04-10: 40 mg via INTRAVENOUS
  Filled 2020-04-10: qty 4

## 2020-04-10 MED ORDER — POTASSIUM CHLORIDE 10 MEQ/100ML IV SOLN
10.0000 meq | INTRAVENOUS | Status: AC
  Administered 2020-04-10 (×2): 10 meq via INTRAVENOUS
  Filled 2020-04-10 (×2): qty 100

## 2020-04-10 MED ORDER — LACTULOSE ENEMA
300.0000 mL | Freq: Every day | ORAL | Status: DC
  Administered 2020-04-10 – 2020-04-12 (×3): 300 mL via RECTAL
  Filled 2020-04-10 (×4): qty 300

## 2020-04-10 MED ORDER — SODIUM CHLORIDE 0.9% FLUSH
10.0000 mL | Freq: Two times a day (BID) | INTRAVENOUS | Status: DC
  Administered 2020-04-10 – 2020-04-12 (×3): 10 mL
  Administered 2020-04-12: 20 mL
  Administered 2020-04-13 – 2020-04-14 (×3): 10 mL
  Administered 2020-04-14: 30 mL
  Administered 2020-04-15 – 2020-04-16 (×3): 10 mL
  Administered 2020-04-16: 20 mL
  Administered 2020-04-17: 30 mL
  Administered 2020-04-17: 10 mL
  Administered 2020-04-18: 20 mL
  Administered 2020-04-18: 10 mL
  Administered 2020-04-19: 30 mL
  Administered 2020-04-19: 40 mL
  Administered 2020-04-20 – 2020-04-23 (×6): 10 mL

## 2020-04-10 MED ORDER — LACTULOSE 10 GM/15ML PO SOLN: 20.0000 g | Freq: Two times a day (BID) | ORAL | Status: DC

## 2020-04-10 MED ORDER — SODIUM CHLORIDE 0.9% FLUSH: 10.0000 mL | INTRAVENOUS | Status: DC | PRN

## 2020-04-10 MED ORDER — SODIUM CHLORIDE 0.9 % IV SOLN
1.5000 g | Freq: Two times a day (BID) | INTRAVENOUS | Status: AC
  Administered 2020-04-10 – 2020-04-15 (×10): 1.5 g via INTRAVENOUS
  Filled 2020-04-10: qty 4
  Filled 2020-04-10: qty 1.5
  Filled 2020-04-10 (×8): qty 4

## 2020-04-10 NOTE — Progress Notes (Signed)
Overnight event  Paged by rapid response RN to come assess the patient for change in mental status and hypoxia.  Patient is admitted for acute encephalopathy and nurse felt that she is more confused now.  She has not received any sedating medications.  Per note from daytime physician, patient was agitated, disoriented, and not following commands.  Patient was seen and examined at bedside.  Oriented to self only.  Constantly moaning and saying "please stop."  Not following any commands.  Continues to be in A. fib and currently on Cardizem drip with rate in the 80s to 90s.  Blood pressure stable with systolic in 536I.  Satting 88% on room air, improved to 94-96% with 4 L supplemental oxygen.  No increased work of breathing or tachypnea.  Auscultation of lungs challenging as patient was constantly moaning but no wheezing appreciated.  Abdomen soft and nontender to palpation.  No significant lower extremity edema.  Plan -Stat chest x-ray ordered.  Give Lasix if there is concern for pulmonary edema/volume overload -Stat head CT -Stat ammonia level -Stat ABG -Plan has been discussed with MD doing remote floor coverage.  She will follow up the results of these studies.

## 2020-04-10 NOTE — Progress Notes (Addendum)
CRITICAL VALUE ALERT  Critical Value:  Potassium 2.6  Date & Time Notied:  04/10/20, 0355  Provider Notified: MD Gwen Pounds  Orders Received/Actions taken: awaiting response.

## 2020-04-10 NOTE — Progress Notes (Signed)
Peripherally Inserted Central Catheter Placement  The IV Nurse has discussed with the patient and/or persons authorized to consent for the patient, the purpose of this procedure and the potential benefits and risks involved with this procedure.  The benefits include less needle sticks, lab draws from the catheter, and the patient may be discharged home with the catheter. Risks include, but not limited to, infection, bleeding, blood clot (thrombus formation), and puncture of an artery; nerve damage and irregular heartbeat and possibility to perform a PICC exchange if needed/ordered by physician.  Alternatives to this procedure were also discussed.  Bard Power PICC patient education guide, fact sheet on infection prevention and patient information card has been provided to patient /or left at bedside.    PICC Placement Documentation  PICC Triple Lumen 64/38/37 PICC Right Basilic 0 cm 37 cm (Active)  Indication for Insertion or Continuance of Line Vasoactive infusions;Limited venous access - need for IV therapy >5 days (PICC only) 04/10/20 1200  Exposed Catheter (cm) 0 cm 04/10/20 1200  Site Assessment Clean;Dry;Intact 04/10/20 1200  Lumen #1 Status Flushed;Saline locked;Blood return noted 04/10/20 1200  Lumen #2 Status Flushed;Saline locked;Blood return noted 04/10/20 1200  Lumen #3 Status Flushed;Saline locked;Blood return noted 04/10/20 1200  Dressing Type Transparent 04/10/20 1200  Dressing Status Clean;Dry;Intact 04/10/20 1200  Antimicrobial disc in place? Yes 04/10/20 1200  Safety Lock Not Applicable 79/39/68 8648  Line Care Connections checked and tightened;Lumen 1 tubing changed;Lumen 2 tubing changed;Lumen 3 tubing changed 04/10/20 1200  Line Adjustment (NICU/IV Team Only) No 04/10/20 1200  Dressing Intervention New dressing 04/10/20 1200  Dressing Change Due 04/17/20 04/10/20 1200       Rolena Infante 04/10/2020, 12:47 PM

## 2020-04-10 NOTE — Progress Notes (Signed)
[facility] PROGRESS NOTE   Stephanie Griffith UVO:[id number] DOB: 10/17/1945 DOA: 04/30/2020  PCP: [name], MD  Brief History/Interval Summary: 74 year old African-American female with a past medical history of chronic diastolic CHF, chronic kidney disease stage V, history of COPD, hyperlipidemia, essential hypertension, paroxysmal atrial fibrillation, pulmonary hypertension who was recently hospitalized for pneumonia due to COVID-19 and was discharged on January 3.  She was discharged home per family request.  Recommendation was for her to go to SNF.  Presented back to the hospital with worsening confusion.  Reason for Visit: Acute metabolic encephalopathy  Consultants: None yet  Procedures: None  Antibiotics: Anti-infectives (From admission, onward)   None      Subjective/Interval History: Overnight events noted.  Patient was noted to be more agitated and confused.  Repeat CT scan did not show any acute findings either.  Noted to be confused and lethargic this morning.      Assessment/Plan:  Acute encephalopathy in the setting of recent COVID-19 She tested positive on December 29.  During her previous hospitalization she was treated with Remdesivir and steroids.  She was also noted to be encephalopathic during her previous hospitalization.  Imaging studies of the brain include a CT scan done during this admission and MRI brain done last month shows evidence for chronic ischemia.  Patient likely has an element of vascular dementia. Ammonia level noted to be mildly elevated at 58.  Initially was not thought to be contributing to her alteration in mental status.  Her LFTs were also normal.  Ammonia was repeated overnight and noted to be 74 this morning.  Lactulose will be ordered.  B12 level was noted to be low back in November 2020. Repeat levels noted to be 272 which is borderline low. She will benefit from supplementation.  She was given 1 dose of IM cyanocobalamin.  Will  need oral supplementation when she is able to take orally.  Folate level 21.2. TSH noted to be normal at 1.89 EEG was done on 1/7 which showed periodic discharges with triphasic morphology was noted. Per report it is on the ictal/interictal continuum. Patient does have risk factors to have seizure activity.  Currently on a trial of Keppra.  COVID-19 infection Seems to be stable from COVID-19 standpoint. Respiratory status is stable.  Any oxygen requirements yesterday but noted to be on oxygen this morning.  She was treated with Remdesivir and steroid during previous hospitalization. She initially tested positive on December 29.  Continue to monitor for now. Can come off of isolation on April 20, 2020.  Hypoxia/aspiration pneumonia Overnight patient was placed on oxygen.  Chest x-ray suggested either atelectasis or or mild edema or atypical infection.  Patient was given Lasix overnight.  Will not give additional doses.  There could be an element of aspiration as well.  WBC noted to be elevated today.  We will place her on Unasyn for now.  Atrial fibrillation with RVR Known history of atrial fibrillation.  Noted to be in RVR. Currently on diltiazem infusion with good heart rate control. Hopefully transition to oral soon once she is able to take orally. Due to significant history of anemia she was never placed on anticoagulation previously.  Was on aspirin. Will defer to outpatient providers.  Chronic kidney disease stage V/metabolic acidosis/hypernatremia Renal function close to baseline.  Due to severe metabolic acidosis he was started on bicarbonate infusion.  Metabolic acidosis has improved.  Continue infusion for another 24 hours and reevaluate tomorrow. Monitor urine output.  Depending on her renal function and urine output may need to involve nephrology. But will watch for now.  Patient has previously declined hemodialysis. Daughter asked me if she would be a candidate for peritoneal  dialysis. We'll need to address these issues with nephrology at follow-up.  Severe hypokalemia Being supplemented intravenously.  Recheck labs later today.  Peripheral artery disease She is status post left AKA.  Seems to be stable currently.  Normocytic anemia Drop in hemoglobin noted today which is likely dilutional.  Discussed with nursing staff.  No overt bleeding noted.  Recheck tomorrow.    Chronic diastolic CHF Monitor volume status.  No evidence for volume overload currently.  If anything she was mildly hypovolemic  History of pulmonary hypertension Seems to be stable.  Monitor.  Constipation Significant stool burden noted on imaging studies.  She was given enema.  Since she is encephalopathic she is not able to take medications orally.  May need to use more enemas and suppositories.  Leukocytosis Initially reason was unclear without any clear source of infection.  WBC subsequently became normal but noted to be elevated again today.  See discussion above.    Lactic acidosis Lactic acid was noted to be elevated at 3.2.  Improved to 2.7.    Hypoalbuminemia Likely due to poor nutrition.  Poor venous access Patient requiring multiple IV medications and fluids. PICC line was considered. She has chronic kidney disease. However she has previously mentioned that she did not want to go on hemodialysis. This was confirmed with the patient's daughter.  PICC line was ordered subsequently and placed on 1/8.  Obesity Estimated body mass index is 30.1 kg/m as calculated from the following:   Height as of this encounter: 5\' 8"  (1.727 m).   Weight as of this encounter: 89.8 kg.    DVT Prophylaxis: Subcutaneous heparin Code Status: Full code Family Communication: Daughter being updated daily Disposition Plan: Unclear but likely will need to go to SNF  Status is: Inpatient  Remains inpatient appropriate because:Altered mental status, Ongoing diagnostic testing needed not  appropriate for outpatient work up and IV treatments appropriate due to intensity of illness or inability to take PO   Dispo: The patient is from: Home              Anticipated d/c is to: SNF              Anticipated d/c date is: 3 days              Patient currently is not medically stable to d/c.     Medications:  Scheduled: . aspirin  81 mg Oral Daily  . Chlorhexidine Gluconate Cloth  6 each Topical Daily  . heparin  5,000 Units Subcutaneous Q8H   Continuous: . diltiazem (CARDIZEM) infusion 15 mg/hr (04/10/20 0844)  . levETIRAcetam 500 mg (04/10/20 1016)  . potassium chloride 10 mEq (04/10/20 1052)  . sodium bicarbonate in D5W 1000 mL infusion 75 mL/hr at 04/08/20 1458   TKW:[id number] **OR** acetaminophen, albuterol, bisacodyl, haloperidol, haloperidol lactate, metoprolol tartrate   Objective:  Vital Signs  Vitals:   04/10/20 0059 04/10/20 0337 04/10/20 0602 04/10/20 0736  BP: 118/78 119/65 (!) 125/58 113/68  Pulse:  74 68 77  Resp:  14 19 20   Temp:  97.6 F (36.4 C) 98.5 F (36.9 C) (!) 97.5 F (36.4 C)  TempSrc:  Oral Axillary Axillary  SpO2: 100% 99% 100% 100%  Weight:      Height:  Intake/Output Summary (Last 24 hours) at 04/10/2020 1106 Last data filed at 04/09/2020 1806 Gross per 24 hour  Intake 1019.52 ml  Output --  Net 1019.52 ml   Filed Weights   04/25/2020 1632  Weight: 89.8 kg    General appearance: Easily arousable but moaning.  Does not answer questions.   Resp: Diminished air entry at the bases with few crackles right more than left.  No wheezing or rhonchi.   Cardio: S1-S2 is normal regular.  No S3-S4.  No rubs murmurs or bruit GI: Abdomen is soft.  Nontender nondistended.  Bowel sounds are present normal.  No masses organomegaly Extremities: Noted to be moving all her extremities.  Left AKA. Neurologic: Disoriented.  No focal neurological deficits.      Lab Results:  Data Reviewed: I have personally reviewed following labs  and imaging studies  CBC: Recent Labs  Lab 04/11/2020 1318 04/10/2020 1346 04/06/2020 1347 04/08/20 0725 04/09/20 0418 04/10/20 0254  WBC 13.5*  --   --  15.0* 9.7 14.6*  NEUTROABS 9.7*  --   --  10.8*  --   --   HGB 10.5* 10.5* 10.5* 8.7* 9.1* 7.5*  HCT 34.3* 31.0* 31.0* 28.3* 29.9* 23.0*  MCV 96.6  --   --  94.0 94.0 90.6  PLT 208  --   --  187 158 580    Basic Metabolic Panel: Recent Labs  Lab 04/04/20 0335 04/06/2020 1316 04/25/2020 1318 04/15/2020 1346 04/02/2020 1347 04/08/20 0030 04/08/20 0549 04/08/20 0725 04/09/20 0418 04/10/20 0254  NA 143   < > 147* 148* 148*  --   --  148* 149* 142  K 4.5   < > 5.1 4.3 4.4  --   --  4.4 4.1 2.6*  CL 116*  --  115*  --  117*  --   --  116* 116* 108  CO2 17*  --  16*  --   --   --   --  11* 16* 22  GLUCOSE 122*  --  86  --  85  --   --  95 110* 276*  BUN 58*  --  67*  --  64*  --   --  68* 68* 61*  CREATININE 4.53*  --  4.45*  --  4.60*  --   --  4.77* 4.76* 4.52*  CALCIUM 8.3*  --  8.6*  --   --   --   --  8.6* 8.5* 7.3*  MG  --   --   --   --   --  2.0 2.1  --   --   --   PHOS  --   --   --   --   --   --  4.2  --   --   --    < > = values in this interval not displayed.    GFR: Estimated Creatinine Clearance: 12.8 mL/min (A) (by C-G formula based on SCr of 4.52 mg/dL (H)).  Liver Function Tests: Recent Labs  Lab 04/06/2020 1318 04/08/20 0725  AST 39 42*  ALT 24 27  ALKPHOS 118 119  BILITOT 0.9 1.0  PROT 7.6 7.3  ALBUMIN 2.8* 2.8*    Recent Labs  Lab 04/08/20 0030 04/10/20 0254  AMMONIA 58* 74*    Coagulation Profile: Recent Labs  Lab 04/08/20 0029  INR 1.1    Cardiac Enzymes: Recent Labs  Lab 04/08/20 0030  CKTOTAL 318*    CBG: Recent Labs  Lab 04/06/2020 1414 04/08/20 0027 04/08/20 0426 04/08/20 0538 04/10/20 0013  GLUCAP 83 80 95 87 107*     Thyroid Function Tests: Recent Labs    04/08/20 0725  TSH 1.894    Anemia Panel: Recent Labs    04/08/20 0549 04/09/20 0418  VITAMINB12  --   272  FOLATE  --  21.2  FERRITIN 143 130  TIBC  --  179*  IRON  --  21*  RETICCTPCT  --  3.0    Recent Results (from the past 240 hour(s))  Culture, blood (single)     Status: None (Preliminary result)   Collection Time: 04/09/2020  1:27 PM   Specimen: BLOOD LEFT FOREARM  Result Value Ref Range Status   Specimen Description BLOOD LEFT FOREARM  Final   Special Requests   Final    BOTTLES DRAWN AEROBIC AND ANAEROBIC Blood Culture results may not be optimal due to an inadequate volume of blood received in culture bottles   Culture   Final    NO GROWTH 2 DAYS Performed at [facility], [place] [address], [place], [place] [address]    Report Status PENDING  Incomplete  Urine culture     Status: None   Collection Time: 04/09/2020  3:02 PM   Specimen: Urine, Random  Result Value Ref Range Status   Specimen Description URINE, RANDOM  Final   Special Requests NONE  Final   Culture   Final    NO GROWTH Performed at [facility], [place] [address] [place], [place] [address]    Report Status 04/08/2020 FINAL  Final      Radiology Studies: CT HEAD WO CONTRAST  Result Date: 04/10/2020 CLINICAL DATA:  Initial evaluation for acute delirium. EXAM: CT HEAD WITHOUT CONTRAST TECHNIQUE: Contiguous axial images were obtained from the base of the skull through the vertex without intravenous contrast. COMPARISON:  Prior CT from 04/22/2020. FINDINGS: Brain: Stable atrophy with moderate chronic microvascular ischemic disease. Remote right cerebellar infarct. No acute intracranial hemorrhage. No acute large vessel territory infarct. No mass lesion, midline shift or mass effect. No hydrocephalus or extra-axial fluid collection. Vascular: No hyperdense vessel. Calcified atherosclerosis at the skull base. Skull: Scalp soft tissues and calvarium within normal limits. Sinuses/Orbits: Globes and orbital soft tissues demonstrate no acute finding. Chronic left maxillary sinusitis noted. Paranasal sinuses are  otherwise largely clear. No mastoid effusion. Other: None. IMPRESSION: 1. No acute intracranial abnormality. 2. Stable atrophy with moderate chronic microvascular ischemic disease. 3. Remote right cerebellar infarct. 4. Chronic left maxillary sinusitis. Electronically Signed   By: Jeannine Boga M.D.   On: 04/10/2020 02:33   DG CHEST PORT 1 VIEW  Result Date: 04/10/2020 CLINICAL DATA:  Dyspnea EXAM: PORTABLE CHEST 1 VIEW COMPARISON:  Radiograph 2022 FINDINGS: Right upper extremity PICC has been retracted with the tip positioned at the level of the mid SVC. Telemetry leads overlie the chest. Redemonstrated cardiomegaly. Atelectatic changes are again seen without lungs without new area of focal consolidation. Additional hazy and reticular opacities could be part of the atelectatic change or reflect mild edema or atypical infection. No pneumothorax or visible effusion. No acute osseous or soft tissue abnormality. IMPRESSION: Right upper extremity PICC has been retracted with the tip positioned at the level of the mid SVC. Low volumes and atelectasis. Some additional hazy and reticular opacity, could be atelectatic versus mild edema or atypical infection. Correlate with clinical status. Electronically Signed   By: Elwin Sleight.D.  On: 04/10/2020 01:05   DG CHEST PORT 1 VIEW  Result Date: 04/09/2020 CLINICAL DATA:  PICC line placement EXAM: PORTABLE CHEST 1 VIEW COMPARISON:  Chest x-ray dated 04/10/2020. FINDINGS: Stable cardiomegaly. Lungs are clear. No pleural effusion or pneumothorax is seen. RIGHT-sided PICC line appears well positioned with tip at the level the RIGHT atrium. IMPRESSION: 1. RIGHT-sided PICC line appears well positioned with tip at the level of the RIGHT atrium. 2. Stable cardiomegaly. 3. No evidence of pneumonia or pulmonary edema. Electronically Signed   By: Franki Cabot M.D.   On: 04/09/2020 16:22   EEG adult  Result Date: 04/08/2020 Lora Havens, MD     04/08/2020  4:12 PM  Patient Name: Stephanie Griffith MRN: [id number] Epilepsy Attending: Lora Havens Referring Physician/Provider: Dr Bonnielee Haff Date: 04/08/2020 Duration: 22.57 mins Patient history: 75yo F with COVId-19 and ams. EEG to evaluate for seizure Level of alertness: lethargic AEDs during EEG study: None Technical aspects: This EEG study was done with scalp electrodes positioned according to the 10-20 International system of electrode placement. Electrical activity was acquired at a sampling rate of 500Hz  and reviewed with a high frequency filter of 70Hz  and a low frequency filter of 1Hz . EEG data were recorded continuously and digitally stored. Description: EEG showed continuous generalized 2-3Hz  delta slowing. Generalized periodic discharges with triphasic morphology at 2hz  were noted. Hyperventilation and photic stimulation were not performed.   ABNORMALITY -Continuous slow, generalized -Periodic discharges with triphasic morphology, generalized IMPRESSION: This study showed generalized periodic discharges with triphasic morphology at 2hz  which is on the ictal-interictal continuum and can sometimes be seen with toxic-metabolic etiology. There is also  moderate to severe diffuse encephalopathy, nonspecific etiology. No seizures  were seen throughout the recording. If suspicion for interictal activity remains a concern, a prolonged study can be considered. Priyanka O Yadav   [place] EKG SITE RITE  Result Date: 04/09/2020 If Site Rite image not attached, placement could not be confirmed due to current cardiac rhythm.      LOS: 2 days   [facility] Pager on [email or web address]  04/10/2020, 11:06 AM

## 2020-04-10 NOTE — Progress Notes (Signed)
Pharmacy Antibiotic Note  Stephanie Griffith is a 75 y.o. female admitted on 04/14/2020 with pneumonia.  Pharmacy has been consulted for unasyn dosing.  Pt has been admitted for increasing confusion. She was recently dc from here for [place]. Unasyn has been ordered to cover for asp PNA. She is CKD5.   Plan: Unasyn 1.5g IV q12 F/u LOT  Height: 5\' 8"  (172.7 cm) Weight: 89.8 kg (197 lb 15.6 oz) IBW/kg (Calculated) : 63.9  Temp (24hrs), Avg:98.1 F (36.7 C), Min:97.5 F (36.4 C), Max:98.7 F (37.1 C)  Recent Labs  Lab 04/17/2020 1318 04/04/2020 1326 04/14/2020 1347 04/08/20 0030 04/08/20 0725 04/09/20 0418 04/10/20 0254  WBC 13.5*  --   --   --  15.0* 9.7 14.6*  CREATININE 4.45*  --  4.60*  --  4.77* 4.76* 4.52*  LATICACIDVEN  --  3.2*  --  2.7*  --   --   --     Estimated Creatinine Clearance: 12.8 mL/min (A) (by C-G formula based on SCr of 4.52 mg/dL (H)).    Allergies  Allergen Reactions  . Morphine And Related Other (See Comments)    AMS    Antimicrobials this admission: 1/9 unasyn>>  Dose adjustments this admission:   Microbiology results:   Onnie Boer, PharmD, BCIDP, AAHIVP, CPP Infectious Disease Pharmacist 04/10/2020 12:29 PM

## 2020-04-10 NOTE — Significant Event (Signed)
Rapid Response Event Note   Reason for Call :  Increased AMS  Initial Focused Assessment:  Oriented to self only, moaning and groaning stating "stop" and/or "let me sleep" SBP 120's on cardizem drip for afib  80-90's  Sats 88% RA Crackles bilateral Afebrile      Interventions:  CXR 4L [place] with O2 improvement 94%   Plan of Care:  MD ordered ABG, Ammonia and CT head Continue to monitor and give lasix if CXR indicated   Event Summary:   MD Notified: 0015  Call [facility] Time: 0005 End Time: 0040  Charlyne Quale, RN

## 2020-04-11 DIAGNOSIS — I5032 Chronic diastolic (congestive) heart failure: Secondary | ICD-10-CM | POA: Diagnosis not present

## 2020-04-11 DIAGNOSIS — N185 Chronic kidney disease, stage 5: Secondary | ICD-10-CM | POA: Diagnosis not present

## 2020-04-11 DIAGNOSIS — I4891 Unspecified atrial fibrillation: Secondary | ICD-10-CM | POA: Diagnosis not present

## 2020-04-11 LAB — BASIC METABOLIC PANEL
Anion gap: 14 (ref 5–15)
BUN: 65 mg/dL — ABNORMAL HIGH (ref 8–23)
CO2: 21 mmol/L — ABNORMAL LOW (ref 22–32)
Calcium: 7.9 mg/dL — ABNORMAL LOW (ref 8.9–10.3)
Chloride: 111 mmol/L (ref 98–111)
Creatinine, Ser: 5.13 mg/dL — ABNORMAL HIGH (ref 0.44–1.00)
GFR, Estimated: 8 mL/min — ABNORMAL LOW (ref >60)
Glucose, Bld: 118 mg/dL — ABNORMAL HIGH (ref 70–99)
Potassium: 3 mmol/L — ABNORMAL LOW (ref 3.5–5.1)
Sodium: 146 mmol/L — ABNORMAL HIGH (ref 135–145)

## 2020-04-11 LAB — CBC
HCT: 25.4 % — ABNORMAL LOW (ref 36.0–46.0)
Hemoglobin: 7.9 g/dL — ABNORMAL LOW (ref 12.0–15.0)
MCH: 28.8 pg (ref 26.0–34.0)
MCHC: 31.1 g/dL (ref 30.0–36.0)
MCV: 92.7 fL (ref 80.0–100.0)
Platelets: 160 10*3/uL (ref 150–400)
RBC: 2.74 MIL/uL — ABNORMAL LOW (ref 3.87–5.11)
RDW: 17.4 % — ABNORMAL HIGH (ref 11.5–15.5)
WBC: 15.2 10*3/uL — ABNORMAL HIGH (ref 4.0–10.5)
nRBC: 1.1 % — ABNORMAL HIGH (ref 0.0–0.2)

## 2020-04-11 LAB — GLUCOSE, CAPILLARY
Glucose-Capillary: 107 mg/dL — ABNORMAL HIGH (ref 70–99)
Glucose-Capillary: 118 mg/dL — ABNORMAL HIGH (ref 70–99)
Glucose-Capillary: 104 mg/dL — ABNORMAL HIGH (ref 70–99)
Glucose-Capillary: 95 mg/dL (ref 70–99)

## 2020-04-11 LAB — AMMONIA: Ammonia: 58 umol/L — ABNORMAL HIGH (ref 9–35)

## 2020-04-11 MED ORDER — SODIUM CHLORIDE 0.45 % IV SOLN: INTRAVENOUS | Status: DC

## 2020-04-11 NOTE — Progress Notes (Signed)
Son came back to bedside, Daughter carol at the beside. It was decided earlier that designated visitor for the day was daughter carol. Son reported to security he understood. Security called and made aware son and daughter are at the bedside and not following Hospital policy. When security asked son to leave he refused. [name] then was upset he couldn't stay. Emotional support provided to family and educated them on hospital policy. [name] then decided she wanted to make pt comfort care. Notified MD  58min later, carol reported she did not want pt comfort care and would like to continue with treatment and DNR. Son was then asked again to leave and notified charge RN. She then explained policy again to son. MD aware that family had decided not to do comfort care.

## 2020-04-11 NOTE — Progress Notes (Signed)
Daughter Stephanie Griffith came to unit, her sister has called her and reported to come see pt, MD had called asking to change her code status. Daughter is upset, she is Equities trader. She understands policy but is adamant she is needs to see her mother. This is approved and she is dressing in full PPE and CAPR. MD updated her on pt status. [name] reports that there are 10 children and no HCPOA. She has provided Dr. [place] Pink with oldest sibling number Stephanie Griffith. He reports she has decided to be DNR. At that time, Son Stephanie Griffith comes to nursing unit, upset, reports he has just recovered from [place] and he is going into the room to see his mother. He pushed his sister Stephanie Griffith aside and entered pts room without PPE on. AC is on unit and aware. Infectious Disease notified. Charge is present talking with ID over the phone. They are going to check and see if she can be taken off precautions as pt has no COVID symptoms and mostly renal related at this time. Daughter exited the room and son stayed at the bedside. VSS. Will continued to monitor.

## 2020-04-11 NOTE — Progress Notes (Addendum)
Pt minimally responsive this am, MD is aware and rounded on pt around 815-830. He is going to talk with family about pt poor prognosis  0900: Pt remain mostly unresponsive. VSS. Pt turned to enema, No reports, mild grimicing and groan. Foley inserted per orders. Talked with Charge, RRT called, they rounded on pt. Per MD he would like no orders at this time, he would like family to have time to decided on comfort care. RRT supportive and asked to call if anything changes. Pt does respond to deep painful stimulation.Pt MEWS remain Red due to LOC. Will continue to monitor vitals and wait for MD orders or family decisions.

## 2020-04-11 NOTE — Progress Notes (Signed)
Spoke with Dr Baxter Flattery regarding patient being on Airborne/Contact precautions. Per Dr. Baxter Flattery, the patient can be taken off isolation. Order for Airborne/Contact discontinued.

## 2020-04-11 NOTE — Progress Notes (Signed)
Enema given, pt unable to hold enema in, unsure of this am effectiveness

## 2020-04-11 NOTE — Progress Notes (Signed)
SLP Cancellation Note  Patient Details Name: Stephanie Griffith MRN: [id number] DOB: [birth year]   Cancelled treatment:       Reason Eval/Treat Not Completed: Patient not medically ready. Pt obtunded per notes. Will f/u if appropriate.    Sahmya Arai, Katherene Ponto 04/11/2020, 12:41 PM

## 2020-04-11 NOTE — Progress Notes (Signed)
DNR band applied to pt arm with 2 RN verification

## 2020-04-11 NOTE — Progress Notes (Addendum)
[facility] PROGRESS NOTE   Stephanie Griffith FAO:[id number] DOB: 03-12-1946 DOA: 04/14/2020  PCP: [name], MD  Brief History/Interval Summary: 75 year old African-American female with a past medical history of chronic diastolic CHF, chronic kidney disease stage V, history of COPD, hyperlipidemia, essential hypertension, paroxysmal atrial fibrillation, pulmonary hypertension who was recently hospitalized for pneumonia due to COVID-19 and was discharged on January 3.  She was discharged home per family request.  Recommendation was for her to go to SNF.  Presented back to the hospital with worsening confusion.  Reason for Visit: Acute metabolic encephalopathy  Consultants: None yet  Procedures: None  Antibiotics: Anti-infectives (From admission, onward)   Start     Dose/Rate Route Frequency Ordered Stop   04/10/20 1330  ampicillin-sulbactam (UNASYN) 1.5 g in sodium chloride 0.9 % 100 mL IVPB        1.5 g 200 mL/hr over 30 Minutes Intravenous Every 12 hours 04/10/20 1232        Subjective/Interval History: Patient noted to be obtunded this morning.  Not really responsive to verbal or painful stimuli.      Assessment/Plan:  Acute encephalopathy in the setting of recent COVID-19 She tested positive on December 29.  During her previous hospitalization she was treated with Remdesivir and steroids.  She was also noted to be encephalopathic during her previous hospitalization.  Imaging studies of the brain include a CT scan done during this admission and MRI brain done last month shows evidence for chronic ischemia.  Patient likely has an element of vascular dementia. Ammonia level noted to be mildly elevated at 58.  Initially was not thought to be contributing to her alteration in mental status.  Her LFTs were also normal.  Ammonia was repeated overnight and noted to be 74 this morning.  Lactulose will be ordered.  B12 level was noted to be low back in November 2020. Repeat levels  noted to be 272 which is borderline low. She will benefit from supplementation.  She was given 1 dose of IM cyanocobalamin.  Will need oral supplementation when she is able to take orally.  Folate level 21.2. TSH noted to be normal at 1.89 EEG was done on 1/7 which showed periodic discharges with triphasic morphology was noted. Per report it is on the ictal/interictal continuum. Patient does have risk factors to have seizure activity.  Started on a trial of Keppra Patient noted to be quite obtunded this morning.  Blood work from this morning shows improvement in ammonia level however her kidney function continues to get worse.  She has not had much urine output.  I believe patient is declining.  Some of her alteration in mental status could be due to [place] which will be held for this morning.  Worsening uremia could also be contributing.  Patient most likely with underlying vascular dementia.  Prognosis appears to be poor at this time.  Long conversation with patient's daughters [name] and [place]. They will discuss with other siblings.  Patient apparently has 10 children.  Strongly encourage them to revisit the patient's CODE STATUS.  They will get back to me shortly.   COVID-19 infection From a COVID-19 standpoint patient seems to be stable.  She has minimal oxygen requirements which is probably due to aspiration.  See discussion below.   She was treated with Remdesivir and steroid during previous hospitalization. She initially tested positive on December 29.  Continue to monitor for now. Can come off of isolation on April 20, 2020.  Hypoxia/aspiration pneumonia  Overnight patient was placed on oxygen.  Chest x-ray suggested either atelectasis or or mild edema or atypical infection.  Patient was started on Unasyn.  1 dose of Lasix was given by the nocturnist on 1/8.  Will not be repeated.  Atrial fibrillation with RVR Known history of atrial fibrillation.  Patient was noted to be in RVR.   Started on diltiazem infusion with good heart rate control.  Cannot transition to oral due to altered mental status.   Due to significant history of anemia she was never placed on anticoagulation previously.  Was on aspirin. Will defer to outpatient providers.  Chronic kidney disease stage V/metabolic acidosis/hypernatremia Patient's renal function noted to be getting worse.  Has not had much urine output.  Foley catheter to be placed.  Due to severe metabolic acidosis she was started on a bicarbonate infusion.  Metabolic acidosis has improved.  Patient seen by nephrology in the outpatient setting.  However considering all of the other active issues patient's prognosis is poor.  She appears to be declining.  We will increase the rate of her IV fluids.  Monitor urine output.  Hold off on involving nephrology till family has decided as to how they want to proceed.   Patient has previously declined hemodialysis.   Severe hypokalemia Improved.  Continue to monitor.  Peripheral artery disease She is status post left AKA.  Seems to be stable currently.  Normocytic anemia Drop in hemoglobin likely combination of dilution and acute illness.  No overt bleeding noted.  Hemoglobin is stable.  Hold off on transfusions   Chronic diastolic CHF Monitor volume status.  No evidence for volume overload currently.  If anything she was mildly hypovolemic  History of pulmonary hypertension Seems to be stable.  Monitor.  Constipation Significant stool burden noted on imaging studies.  She was given enema.  Since she is encephalopathic she is not able to take medications orally.  May need to use more enemas and suppositories.  Leukocytosis Probably due to aspiration pneumonia.  Lactic acidosis Lactic acid was noted to be elevated at 3.2.  Improved to 2.7.    Hypoalbuminemia Likely due to poor nutrition.  Poor venous access Patient requiring multiple IV medications and fluids. PICC line was considered.  She has chronic kidney disease. However she has previously mentioned that she did not want to go on hemodialysis. This was confirmed with the patient's daughter.  PICC line was ordered subsequently and placed on 1/8.  Obesity Estimated body mass index is 30.1 kg/m as calculated from the following:   Height as of this encounter: 5\' 8"  (1.727 m).   Weight as of this encounter: 89.8 kg.  Goals of care Patient actively declining.  Prognosis is poor at this time.  This was communicated to patient's daughters.  Strongly encouraged them to consider changing patient's CODE STATUS.  They were told clearly that patient may continue to decline and may not survive this hospitalization.  Patient is a very poor candidate for resuscitative efforts.  Discussed with 2 other daughters including one at bedside who apparently is a Adult nurse.  She mentions that the eldest daughter [name] is the one who needs to decide regarding CODE STATUS.  I called [name] and discussed patient's situation with her.  CODE STATUS changed to DNR.  DVT Prophylaxis: Subcutaneous heparin Code Status: DNR Family Communication: Discussed with 2 of her daughters today in detail.  Waiting on them to get back to [place] regarding CODE STATUS. Disposition Plan: Unclear due  to worsening clinical status.  Status is: Inpatient  Remains inpatient appropriate because:Altered mental status, Ongoing diagnostic testing needed not appropriate for outpatient work up and IV treatments appropriate due to intensity of illness or inability to take PO   Dispo: The patient is from: Home              Anticipated d/c is to: SNF              Anticipated d/c date is: 3 days              Patient currently is not medically stable to d/c.     Medications:  Scheduled: . Chlorhexidine Gluconate Cloth  6 each Topical Daily  . heparin  5,000 Units Subcutaneous Q8H  . lactulose  300 mL Rectal Daily  . sodium chloride flush  10-40 mL Intracatheter Q12H    Continuous: . ampicillin-sulbactam (UNASYN) IV Stopped (04/11/20 0300)  . diltiazem (CARDIZEM) infusion 15 mg/hr (04/11/20 1000)  . sodium bicarbonate in D5W 1000 mL infusion 75 mL/hr at 04/11/20 1000   VZD:GLOVFIEPPIRJJ **OR** acetaminophen, albuterol, bisacodyl, haloperidol, haloperidol lactate, metoprolol tartrate, sodium chloride flush   Objective:  Vital Signs  Vitals:   04/11/20 0100 04/11/20 0500 04/11/20 0740 04/11/20 0915  BP: 124/68 110/70 105/71 140/80  Pulse:   71 79  Resp: 16  12   Temp: 98.6 F (37 C) 98.6 F (37 C) 98.5 F (36.9 C)   TempSrc: Axillary Axillary Oral   SpO2: 97%  99% 100%  Weight:      Height:        Intake/Output Summary (Last 24 hours) at 04/11/2020 1059 Last data filed at 04/11/2020 1000 Gross per 24 hour  Intake 2596.96 ml  Output -  Net 2596.96 ml   Filed Weights   04/08/2020 1632  Weight: 89.8 kg    General appearance: Obtunded this morning. Resp: Noted to be tachypneic.  Diminished air entry at the bases with crackles bilaterally, right more than left.  No wheezing or rhonchi. Cardio: S1-S2 is irregularly irregular.  No S3-S4.  No rubs murmurs or bruit GI: Abdomen is soft.  Nontender nondistended.  Bowel sounds are present normal.  No masses organomegaly Extremities: Left AKA Neurologic: Obtunded this morning.       Lab Results:  Data Reviewed: I have personally reviewed following labs and imaging studies  CBC: Recent Labs  Lab 04/06/2020 1318 04/29/2020 1346 04/25/2020 1347 04/08/20 0725 04/09/20 0418 04/10/20 0254 04/11/20 0500  WBC 13.5*  --   --  15.0* 9.7 14.6* 15.2*  NEUTROABS 9.7*  --   --  10.8*  --   --   --   HGB 10.5*   < > 10.5* 8.7* 9.1* 7.5* 7.9*  HCT 34.3*   < > 31.0* 28.3* 29.9* 23.0* 25.4*  MCV 96.6  --   --  94.0 94.0 90.6 92.7  PLT 208  --   --  187 158 154 160   < > = values in this interval not displayed.    Basic Metabolic Panel: Recent Labs  Lab 04/19/2020 1318 04/06/2020 1346 04/11/2020 1347  04/08/20 0030 04/08/20 0549 04/08/20 0725 04/09/20 0418 04/10/20 0254 04/10/20 1732 04/11/20 0500  NA 147*   < > 148*  --   --  148* 149* 142  --  146*  K 5.1   < > 4.4  --   --  4.4 4.1 2.6* 3.3* 3.0*  CL 115*  --  117*  --   --  116* 116* 108  --  111  CO2 16*  --   --   --   --  11* 16* 22  --  21*  GLUCOSE 86  --  85  --   --  95 110* 276*  --  118*  BUN 67*  --  64*  --   --  68* 68* 61*  --  65*  CREATININE 4.45*  --  4.60*  --   --  4.77* 4.76* 4.52*  --  5.13*  CALCIUM 8.6*  --   --   --   --  8.6* 8.5* 7.3*  --  7.9*  MG  --   --   --  2.0 2.1  --   --   --   --   --   PHOS  --   --   --   --  4.2  --   --   --   --   --    < > = values in this interval not displayed.    GFR: Estimated Creatinine Clearance: 11.3 mL/min (A) (by C-G formula based on SCr of 5.13 mg/dL (H)).  Liver Function Tests: Recent Labs  Lab 04/21/2020 1318 04/08/20 0725  AST 39 42*  ALT 24 27  ALKPHOS 118 119  BILITOT 0.9 1.0  PROT 7.6 7.3  ALBUMIN 2.8* 2.8*    Recent Labs  Lab 04/08/20 0030 04/10/20 0254 04/11/20 0500  AMMONIA 58* 74* 58*    Coagulation Profile: Recent Labs  Lab 04/08/20 0029  INR 1.1    Cardiac Enzymes: Recent Labs  Lab 04/08/20 0030  CKTOTAL 318*    CBG: Recent Labs  Lab 04/08/20 0027 04/08/20 0426 04/08/20 0538 04/10/20 0013 04/11/20 0748  GLUCAP 80 95 87 107* 107*     Anemia Panel: Recent Labs    04/09/20 0418  VITAMINB12 272  FOLATE 21.2  FERRITIN 130  TIBC 179*  IRON 21*  RETICCTPCT 3.0    Recent Results (from the past 240 hour(s))  Culture, blood (single)     Status: None (Preliminary result)   Collection Time: 04/20/2020  1:27 PM   Specimen: BLOOD LEFT FOREARM  Result Value Ref Range Status   Specimen Description BLOOD LEFT FOREARM  Final   Special Requests   Final    BOTTLES DRAWN AEROBIC AND ANAEROBIC Blood Culture results may not be optimal due to an inadequate volume of blood received in culture bottles   Culture   Final     NO GROWTH 4 DAYS Performed at [facility], [place] [address], [place], [place] [address]    Report Status PENDING  Incomplete  Urine culture     Status: None   Collection Time: 04/29/2020  3:02 PM   Specimen: Urine, Random  Result Value Ref Range Status   Specimen Description URINE, RANDOM  Final   Special Requests NONE  Final   Culture   Final    NO GROWTH Performed at [facility], [place] [address] [place], [place] [address]    Report Status 04/08/2020 FINAL  Final      Radiology Studies: CT HEAD WO CONTRAST  Result Date: 04/10/2020 CLINICAL DATA:  Initial evaluation for acute delirium. EXAM: CT HEAD WITHOUT CONTRAST TECHNIQUE: Contiguous axial images were obtained from the base of the skull through the vertex without intravenous contrast. COMPARISON:  Prior CT from 04/07/2020. FINDINGS: Brain: Stable atrophy with moderate chronic microvascular ischemic disease. Remote right cerebellar infarct. No  acute intracranial hemorrhage. No acute large vessel territory infarct. No mass lesion, midline shift or mass effect. No hydrocephalus or extra-axial fluid collection. Vascular: No hyperdense vessel. Calcified atherosclerosis at the skull base. Skull: Scalp soft tissues and calvarium within normal limits. Sinuses/Orbits: Globes and orbital soft tissues demonstrate no acute finding. Chronic left maxillary sinusitis noted. Paranasal sinuses are otherwise largely clear. No mastoid effusion. Other: None. IMPRESSION: 1. No acute intracranial abnormality. 2. Stable atrophy with moderate chronic microvascular ischemic disease. 3. Remote right cerebellar infarct. 4. Chronic left maxillary sinusitis. Electronically Signed   By: Jeannine Boga M.D.   On: 04/10/2020 02:33   DG CHEST PORT 1 VIEW  Result Date: 04/10/2020 CLINICAL DATA:  Status post PICC line placement EXAM: PORTABLE CHEST 1 VIEW COMPARISON:  April 10, 2020 FINDINGS: The right PICC line is been advanced terminating in the  central SVC. Stable cardiomegaly. The hila and mediastinum normal. Pulmonary venous congestion/mild edema persists. No focal infiltrate. No other acute abnormalities. IMPRESSION: Interval advancement of right PICC line into the central SVC. Electronically Signed   By: Dorise Bullion III M.D   On: 04/10/2020 14:36   DG CHEST PORT 1 VIEW  Result Date: 04/10/2020 CLINICAL DATA:  Dyspnea EXAM: PORTABLE CHEST 1 VIEW COMPARISON:  Radiograph 2022 FINDINGS: Right upper extremity PICC has been retracted with the tip positioned at the level of the mid SVC. Telemetry leads overlie the chest. Redemonstrated cardiomegaly. Atelectatic changes are again seen without lungs without new area of focal consolidation. Additional hazy and reticular opacities could be part of the atelectatic change or reflect mild edema or atypical infection. No pneumothorax or visible effusion. No acute osseous or soft tissue abnormality. IMPRESSION: Right upper extremity PICC has been retracted with the tip positioned at the level of the mid SVC. Low volumes and atelectasis. Some additional hazy and reticular opacity, could be atelectatic versus mild edema or atypical infection. Correlate with clinical status. Electronically Signed   By: Lovena Le M.D.   On: 04/10/2020 01:05   DG CHEST PORT 1 VIEW  Result Date: 04/09/2020 CLINICAL DATA:  PICC line placement EXAM: PORTABLE CHEST 1 VIEW COMPARISON:  Chest x-ray dated 04/25/2020. FINDINGS: Stable cardiomegaly. Lungs are clear. No pleural effusion or pneumothorax is seen. RIGHT-sided PICC line appears well positioned with tip at the level the RIGHT atrium. IMPRESSION: 1. RIGHT-sided PICC line appears well positioned with tip at the level of the RIGHT atrium. 2. Stable cardiomegaly. 3. No evidence of pneumonia or pulmonary edema. Electronically Signed   By: Franki Cabot M.D.   On: 04/09/2020 16:22   [place] EKG SITE RITE  Result Date: 04/09/2020 If Site Rite image not attached, placement could not  be confirmed due to current cardiac rhythm.      LOS: 3 days   Krishiv Sandler [employer] on [email or web address]  04/11/2020, 10:59 AM

## 2020-04-12 DIAGNOSIS — I4891 Unspecified atrial fibrillation: Secondary | ICD-10-CM | POA: Diagnosis not present

## 2020-04-12 DIAGNOSIS — N185 Chronic kidney disease, stage 5: Secondary | ICD-10-CM | POA: Diagnosis not present

## 2020-04-12 DIAGNOSIS — I5032 Chronic diastolic (congestive) heart failure: Secondary | ICD-10-CM | POA: Diagnosis not present

## 2020-04-12 LAB — BASIC METABOLIC PANEL
Anion gap: 14 (ref 5–15)
BUN: 63 mg/dL — ABNORMAL HIGH (ref 8–23)
CO2: 23 mmol/L (ref 22–32)
Calcium: 7.7 mg/dL — ABNORMAL LOW (ref 8.9–10.3)
Chloride: 105 mmol/L (ref 98–111)
Creatinine, Ser: 5.31 mg/dL — ABNORMAL HIGH (ref 0.44–1.00)
GFR, Estimated: 8 mL/min — ABNORMAL LOW (ref >60)
Glucose, Bld: 112 mg/dL — ABNORMAL HIGH (ref 70–99)
Potassium: 3.3 mmol/L — ABNORMAL LOW (ref 3.5–5.1)
Sodium: 142 mmol/L (ref 135–145)

## 2020-04-12 LAB — CBC
HCT: 27.3 % — ABNORMAL LOW (ref 36.0–46.0)
Hemoglobin: 8.5 g/dL — ABNORMAL LOW (ref 12.0–15.0)
MCH: 28.7 pg (ref 26.0–34.0)
MCHC: 31.1 g/dL (ref 30.0–36.0)
MCV: 92.2 fL (ref 80.0–100.0)
Platelets: 141 10*3/uL — ABNORMAL LOW (ref 150–400)
RBC: 2.96 MIL/uL — ABNORMAL LOW (ref 3.87–5.11)
RDW: 17.3 % — ABNORMAL HIGH (ref 11.5–15.5)
WBC: 11.2 10*3/uL — ABNORMAL HIGH (ref 4.0–10.5)
nRBC: 1 % — ABNORMAL HIGH (ref 0.0–0.2)

## 2020-04-12 LAB — GLUCOSE, CAPILLARY
Glucose-Capillary: 102 mg/dL — ABNORMAL HIGH (ref 70–99)
Glucose-Capillary: 105 mg/dL — ABNORMAL HIGH (ref 70–99)
Glucose-Capillary: 121 mg/dL — ABNORMAL HIGH (ref 70–99)
Glucose-Capillary: 92 mg/dL (ref 70–99)
Glucose-Capillary: 96 mg/dL (ref 70–99)
Glucose-Capillary: 98 mg/dL (ref 70–99)

## 2020-04-12 LAB — CULTURE, BLOOD (SINGLE): Culture: NO GROWTH

## 2020-04-12 MED ORDER — OSMOLITE 1.2 CAL PO LIQD
1000.0000 mL | ORAL | Status: DC
Start: 2020-04-12 — End: 2020-04-23
  Administered 2020-04-14 – 2020-04-22 (×9): 1000 mL
  Filled 2020-04-12 (×7): qty 1000

## 2020-04-12 MED ORDER — FREE WATER
150.0000 mL | Freq: Four times a day (QID) | Status: DC
  Administered 2020-04-13 – 2020-04-22 (×32): 150 mL

## 2020-04-12 MED ORDER — PROSOURCE TF PO LIQD
45.0000 mL | Freq: Every day | ORAL | Status: DC
  Administered 2020-04-13 – 2020-04-22 (×9): 45 mL
  Filled 2020-04-12 (×9): qty 45

## 2020-04-12 MED ORDER — OSMOLITE 1.2 CAL PO LIQD: 1000.0000 mL | ORAL | Status: DC

## 2020-04-12 NOTE — Evaluation (Signed)
Clinical/Bedside Swallow Evaluation Patient Details  Name: Stephanie Griffith MRN: [id number] Date of Birth: February 04, 1946  Today's Date: 04/12/2020 Time: SLP Start Time (ACUTE ONLY): 1211 SLP Stop Time (ACUTE ONLY): 1232 SLP Time Calculation (min) (ACUTE ONLY): 21 min  Past Medical History:  Past Medical History:  Diagnosis Date  . Anemia of chronic disease   . Chronic diastolic CHF (congestive heart failure) ([place])   . Chronic renal insufficiency, stage IV (severe) ([place])   . COPD (chronic obstructive pulmonary disease) ([place])   . Hyperglycemia   . Hyperlipidemia   . Hypertension   . PAD (peripheral artery disease) ([place])    a. s/p femoral to below knee popliteal bypass then L AKA.  Marland Kitchen PAF (paroxysmal atrial fibrillation) ([place])   . PAT (paroxysmal atrial tachycardia) ([place])   . Pulmonary hypertension ([place])   . Umbilical hernia    Past Surgical History:  Past Surgical History:  Procedure Laterality Date  . AMPUTATION  06/06/2011   Procedure: AMPUTATION DIGIT;  Surgeon: Elam Dutch, MD;  Location: [facility] OR;  Service: Vascular;  Laterality: Left;  Transmetatarsal amputation left great toe  . AMPUTATION  06/12/2011   Procedure: AMPUTATION ABOVE KNEE;  Surgeon: Elam Dutch, MD;  Location: [facility];  Service: Vascular;  Laterality: Left;  . FEMORAL-POPLITEAL BYPASS GRAFT  06/06/2011   Procedure: BYPASS GRAFT FEMORAL-POPLITEAL ARTERY;  Surgeon: Elam Dutch, MD;  [facility];  Service: Vascular;  Laterality: Left;  left femoral to popliteal bypass with composite vein and propaten 44mm x 80 graft  . LOWER EXTREMITY ANGIOGRAM N/A 06/04/2011   Procedure: LOWER EXTREMITY ANGIOGRAM;  Surgeon: Angelia Mould, MD;  Location: [facility] CATH LAB;  Service: Cardiovascular;  Laterality: N/A;  . OMENTECTOMY  12/31/2015   Procedure: Partial OMENTECTOMY;  Surgeon: Georganna Skeans, MD;  Location: [place];  Service: General;;  . RIGHT HEART CATH N/A 02/13/2019   Procedure: RIGHT HEART CATH;  Surgeon: Jolaine Artist, MD;  Location: [place] [facility] CV LAB;  Service: Cardiovascular;  Laterality: N/A;  . TUBAL LIGATION    . VENTRAL HERNIA REPAIR N/A 12/31/2015   Procedure: REPAIR INCARCERATED VENTRAL HERNIA, POSSIBLE BOWEL RESECTION;  Surgeon: Georganna Skeans, MD;  Location: [place];  Service: General;  Laterality: N/A;   HPI:  Stephanie Griffith is a 75 y.o. female with medical history significant of anemia, chronic diastolic CHF, chronic renal sufficiency stage IV, COPD, HLD, HTN, PAD, paroxysmal atrial fibrillation, pulmonary hypertension.  COVID-19 symptoms began around 12/29.  Pt was recently discharged from hospital on 04/04/20 and returned on 04/25/2020 secondary to confusion and decreased PO intake.   Assessment / Plan / Recommendation Clinical Impression  A repeat clinical swallow evaluation was conducted per MD request. Pt continues to present with s/s of oral dysphagia but without overt s/s of aspiration or clinical indicators of pharyngeal dysphagia. Pt noted to have difficulty with secretion management on SLP arrival.  There was right sided loss of secretions with pad provided to help with absorbtion.  SLP repositioned pt as upright as possible in bed.  Pt even requested "let me sit up more" after HOB raised initially. Pt tolerated all consistencies trialed with no clinical s/s of aspiration, including thin liquid by serial straw sip. Despite poor secretion management pt was able to control boluses of both thin liquid by straw and puree by spoon without anterior loss.  With regular solid, pt was able to take a small bite independently.  When SLP provided larger bolus trial, there  was some anterior loss of cracker.  Pt was able to acheive adequate oral clearance of cracker with use of liquid wash or by following with trial of puree.  Pt requested additional bites of puree and sips of water.  Spoke with RN who seemed surprised with pt's performance today.  It seems she has been less alert throughout the morning and  largely nonverbal.  If pt is lethargic, please hold POs until she is fully awake, alert.   Suspect that pt may not be able to meet her nutritional needs with oral diet alone, but pt does appear safe to continue POs.  Most recent CXR 10/9 revealed no focal infiltrate, but if there is concern for silent aspiration, please place order for MBSS.    Recommend resuming puree diet with thin liquids.  SLP will continue to follow for diet tolerance and possible advancement.  SLP Visit Diagnosis: Dysphagia, oral phase (R13.11)    Aspiration Risk  Mild aspiration risk    Diet Recommendation Dysphagia 1 (Puree);Thin liquid   Medication Administration: Crushed with puree Supervision: Staff to assist with self feeding Compensations: Minimize environmental distractions;Slow rate;Small sips/bites (Only feed when fully awake/alert) Postural Changes: Seated upright at 90 degrees    Other  Recommendations Oral Care Recommendations: Oral care BID;Staff/trained caregiver to provide oral care   Follow up Recommendations Skilled Nursing facility;24 hour supervision/assistance      Frequency and Duration    1 week       Prognosis Prognosis for Safe Diet Advancement: [place] Date of Onset: 04/08/20 HPI: Stephanie Griffith is a 75 y.o. female with medical history significant of anemia, chronic diastolic CHF, chronic renal sufficiency stage IV, COPD, HLD, HTN, PAD, paroxysmal atrial fibrillation, pulmonary hypertension.  COVID-19 symptoms began around 12/29.  Pt was recently discharged from hospital on 04/04/20 and returned on 04/05/2020 secondary to confusion and decreased PO intake. Type of Study: Bedside Swallow Evaluation Previous Swallow Assessment: BSE 1/8 Diet Prior to this Study: NPO Temperature Spikes Noted: No Respiratory Status: Room air History of Recent Intubation: No Behavior/Cognition: Alert;Requires cueing Oral Cavity Assessment: Within Functional Limits Oral Care  Completed by SLP: Yes Oral Cavity - Dentition: Poor condition Self-Feeding Abilities: Total assist Patient Positioning: Upright in bed Baseline Vocal Quality: Normal Volitional Cough: Cognitively unable to elicit Volitional Swallow: Unable to elicit    Oral/Motor/Sensory Function Overall Oral Motor/Sensory Function: Mild impairment Facial ROM: Reduced right Facial Symmetry: Abnormal symmetry right Lingual ROM:  (Could not test) Lingual Symmetry:  (could not test) Lingual Strength:  (could not test) Velum:  (Could not test) Mandible:  (Reduced)   Ice Chips Ice chips: Not tested   Thin Liquid Thin Liquid: Within functional limits Presentation: Straw    Nectar Thick Nectar Thick Liquid: Not tested   Honey Thick Honey Thick Liquid: Not tested   Puree Puree: Within functional limits Presentation: Spoon   Solid     Solid: Impaired Oral Phase Impairments: Impaired mastication Oral Phase Functional Implications: Right anterior spillage;Prolonged oral transit      Celedonio Savage, MA, [place] Office: [phone]  04/12/2020,12:46 PM

## 2020-04-12 NOTE — Progress Notes (Signed)
Nutrition Follow-up  DOCUMENTATION CODES:   Not applicable  INTERVENTION:  - Please obtain admission weight  Once Cortrak has been placed and is ready for use, recommend: -Begin Osmolite 1.2 @ 20 mL/hr, increase feeding by 10 mL/hr every 4 hours to goal of 60 mL/hr (1440 ml/day) -ProSource TF 45 ml daily -169ml free water Q6H  Recommended tube feeding regimen would provide 1768 kcal, 91 grams of protein, and 1181 ml of H2O (1765ml total free water with flushes).  Note Cortrak service is only available on Monday/Wednesday/Friday.  NUTRITION DIAGNOSIS:   Inadequate oral intake related to lethargy/confusion as evidenced by meal completion < 25%. - updated  GOAL:   Patient will meet greater than or equal to 90% of their needs - will address with TF  MONITOR:   TF tolerance,Weight trends,Labs,I & O's  REASON FOR ASSESSMENT:   Consult Enteral/tube feeding initiation and management  ASSESSMENT:   75 year old female who presented to the ED on 1/06 with AMS. PMH of anemia, CHF, CKD stage IV, COPD, HLD, HTN, PAD s/p AKA, atrial fibrillation. Pt with recent admission for COVID pneumonia and encephalopathy and was discharged on 04/04/20.   Pt's mentation has not improved much per MD and pt's prognosis is guarded to poor. Pt's baseline Cr is ~4.5. Pt has been experiencing worsening renal function over the last 72 hours. Pt previously declined hemodialysis; Nephrology not involved due to pt's overall prognosis.  Yesterday, pt was thought to be actively declining due to AMS and worsening renal function and code status was changed to DNR. Palliative Care has been consulted to address [place] with family. In the meantime, the family has expressed concern due to the pt's lack of po intake and would like for pt to receive TF via NGT/Cortrak. MD placed order for Cortrak placement and consulted RD for TF initiation/management. Note Cortrak service is only available on Monday/Wednesday/Friday.  Pt  still without updated weight for this admission.   Medications: chronulac Labs: K+ 3.3 (L), Cr 5.31 (H, trending up), CBGs 53-976-73  Diet Order:   Diet Order            DIET - DYS 1 Room service appropriate? Yes; Fluid consistency: Thin  Diet effective now                 EDUCATION NEEDS:   Not appropriate for education at this time  Skin:  Skin Assessment: Skin Integrity Issues: Incisions: closed incision to left leg  Last BM:  1/10 type 6  Height:   Ht Readings from Last 1 Encounters:  04/10/2020 5\' 8"  (1.727 m)    Weight:   Wt Readings from Last 1 Encounters:  04/25/2020 89.8 kg    BMI:  Body mass index is 30.1 kg/m.  Estimated Nutritional Needs:   Kcal:  4193-7902  Protein:  90-110 grams  Fluid:  1.7-1.9 L  Larkin Ina, MS, RD, LDN RD pager number and weekend/on-call pager number located in [place].

## 2020-04-12 NOTE — Care Management Important Message (Signed)
Important Message  Patient Details  Name: Stephanie Griffith MRN: [id number] Date of Birth: 1945/09/22   Medicare Important Message Given:  Yes     Orbie Pyo 04/12/2020, 1:33 PM

## 2020-04-12 NOTE — Progress Notes (Signed)
Physical Therapy Treatment Patient Details Name: Stephanie Griffith MRN: [id number] DOB: 1946/01/08 Today's Date: 04/12/2020    History of Present Illness Pt is a 75 y.o. female with a medical hx significant for pulmonary HTN, L AKA, PAT, PAF, PAD, HTN, hyperglycemia, COPD, chronic renal insufficiency stage IV, CHF, and anemia who presents with increased confusion and decreased Po instake since her ecent admission for COVID pneumonia and encephalopathy (D/C'd 04/04/20). Suspect underlying dementia with sundowning. Pt admitted for acute encephalopathy due to COVID-19. EEG showed: generalized periodic discharges with triphasic morphology at 2hz  which is on the ictal-interictal continuum and can sometimes be seen with toxic-metabolic etiology, moderate to severe diffuse encephalopathy with nonspecific etiology, and no seizures were seen. 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    PT Comments    Patient progressing slowly towards PT goals. More alert and interactive today. Pt has her spunk back and is presenting closer to cognitive baseline per daughter who is present in room. Worked on sitting balance and self feeding EOB; fatigues quickly with pt leaning onto right forearm on pillow for support but able to return self to midline without assist. At times needs Max A due to right lateral lean. Pt with weakness in BUEs and needs RUE supported when feeding. Pt upset that she has not been discharged yet. Does not seem to be at functional baseline as daughter reports pt normally feeds self at home PTA. If family decides to take pt home, recommend HHPT. Will follow.   Follow Up Recommendations  SNF;Supervision/Assistance - 24 hour     Equipment Recommendations  Hospital bed    Recommendations for Other Services       Precautions / Restrictions Precautions Precautions: Fall Precaution Comments: L AKA, prosthesis no longer fits has not worn for a  long time. Restrictions Weight Bearing Restrictions: Yes LLE Weight Bearing: Non weight bearing    Mobility  Bed Mobility Overal bed mobility: Needs Assistance Bed Mobility: Supine to Sit;Sit to Supine     Supine to sit: Total assist;+2 for physical assistance;+2 for safety/equipment Sit to supine: Total assist;+2 for physical assistance;+2 for safety/equipment   General bed mobility comments: requires assist for all aspects; minimal to no initiation during transition.  Transfers                 General transfer comment: Deferred due to safety.  Ambulation/Gait             General Gait Details: Unable-WC bound   Marine scientist [place] (Stroke Patients Only)       Balance Overall balance assessment: Needs assistance Sitting-balance support: Single extremity supported (foot supported) Sitting balance-Leahy Scale: Poor Sitting balance - Comments: Right lateral lean requiring max A with periods of leaning on RUE on pillow when fatigue. Able to bring self to midline. Assisted pt with feeding self, having difficulty raising RUE onto tray table without assist/support. Able to grab cup to bring to mouth. Sat EOB for feeding and sitting balance for ~18 minutes. Postural control: Right lateral lean                                  Cognition Arousal/Alertness: Awake/alert Behavior During Therapy: Flat affect Overall Cognitive Status: Impaired/Different from baseline Area of Impairment: Orientation;Attention;Memory;Following commands;Safety/judgement;Awareness;Problem solving  Orientation Level: Disoriented to;Place;Time;Situation Current Attention Level: Sustained Memory: Decreased short-term memory Following Commands: Follows one step commands inconsistently;Follows one step commands with increased time Safety/Judgement: Decreased awareness of safety;Decreased awareness of  deficits Awareness: Intellectual Problem Solving: Decreased initiation;Slow processing;Difficulty sequencing;Requires verbal cues;Requires tactile cues General Comments: Pt spunky today; ordering daughter to feed her her lunch. Able to follow simple commands with increased time. Slow processing.      Exercises General Exercises - Lower Extremity Long Arc Quad: AAROM;Right;5 reps;Seated    General Comments General comments (skin integrity, edema, etc.): Sp02 remained >96% on 2L/min 02 [place]. Daughter, [name] present during session.      Pertinent Vitals/Pain Pain Assessment: Faces Faces Pain Scale: No hurt    Home Living                      Prior Function            PT Goals (current goals can now be found in the care plan section) Progress towards PT goals: Progressing toward goals (slowly)    Frequency    Min 3X/week      PT Plan Current plan remains appropriate    Co-evaluation              AM-PAC PT "6 Clicks" Mobility   Outcome Measure  Help needed turning from your back to your side while in a flat bed without using bedrails?: Total Help needed moving from lying on your back to sitting on the side of a flat bed without using bedrails?: Total Help needed moving to and from a bed to a chair (including a wheelchair)?: Total Help needed standing up from a chair using your arms (e.g., wheelchair or bedside chair)?: Total Help needed to walk in hospital room?: Total Help needed climbing 3-5 steps with a railing? : Total 6 Click Score: 6    End of Session   Activity Tolerance: Patient tolerated treatment well;Patient limited by fatigue Patient left: in bed;with call bell/phone within reach;with bed alarm set;with family/visitor present Nurse Communication: Need for lift equipment;Mobility status PT Visit Diagnosis: Muscle weakness (generalized) (M62.81)     Time: 1610-9604 PT Time Calculation (min) (ACUTE ONLY): 29 min  Charges:  $Therapeutic  Activity: 23-37 mins                     Marisa Severin, PT, DPT Acute Rehabilitation Services Pager [phone] Office [phone]       [name] 04/12/2020, 3:42 PM

## 2020-04-12 NOTE — Progress Notes (Addendum)
Cardizem restarted at the lowest rate of 5mg /hr per MD order. MD aware.

## 2020-04-12 NOTE — Plan of Care (Signed)
Pending re-evaluation for need of coretrack placement. Pt ate 100% of lunch and dinner with assistance of daughter. She is more alert and able to communicate needs. Cardizem gtt stopped this am d/t HR <60. Pls see flowsheets. Dr. [place] Pink aware. If pt's HR is uncontrolled pls restart gtt according to orders. 1 smear BM for this shift. Low UO urinary cath in place. CHG bath given. Cont fluids 0.45% NS and Bicarb gtt. Daughter visiting at bedside. No concerns at this time.   Problem: Nutrition: Goal: Adequate nutrition will be maintained Outcome: Progressing   Problem: Coping: Goal: Level of anxiety will decrease Outcome: Progressing   Problem: Elimination: Goal: Will not experience complications related to bowel motility Outcome: Progressing   Problem: Pain Managment: Goal: General experience of comfort will improve Outcome: Progressing

## 2020-04-12 NOTE — Progress Notes (Signed)
[facility] PROGRESS NOTE   Stephanie Griffith SEG:[id number] DOB: 1945-09-03 DOA: 04/19/2020  PCP: [name], MD  Brief History/Interval Summary: 75 year old African-American female with a past medical history of chronic diastolic CHF, chronic kidney disease stage V, history of COPD, hyperlipidemia, essential hypertension, paroxysmal atrial fibrillation, pulmonary hypertension who was recently hospitalized for pneumonia due to COVID-19 and was discharged on January 3.  She was discharged home per family request.  Recommendation was for her to go to SNF.  Presented back to the hospital with worsening confusion.  Reason for Visit: Acute metabolic encephalopathy  Consultants: Palliative care will be consulted  Procedures: None  Antibiotics: Anti-infectives (From admission, onward)   Start     Dose/Rate Route Frequency Ordered Stop   04/10/20 1330  ampicillin-sulbactam (UNASYN) 1.5 g in sodium chloride 0.9 % 100 mL IVPB        1.5 g 200 mL/hr over 30 Minutes Intravenous Every 12 hours 04/10/20 1232        Subjective/Interval History: Patient's mentation has not improved much.  She did open her eyes today to voice command but did not engage any further.       Assessment/Plan:  Acute encephalopathy in the setting of recent COVID-19 She tested positive on December 29.  During her previous hospitalization she was treated with Remdesivir and steroids.  She was also noted to be encephalopathic during her previous hospitalization.  Imaging studies of the brain include a CT scan done during this admission and MRI brain done last month shows evidence for chronic ischemia.  Patient likely has an element of vascular dementia. Ammonia level noted to be mildly elevated at 58.  Initially was not thought to be contributing to her alteration in mental status.  Her LFTs were also normal.  Ammonia was repeated and noted to be 74 on 1/9.  Lactulose enema was ordered with improvement in ammonia  level.  We will recheck tomorrow.   B12 level was noted to be low back in November 2020. Repeat levels noted to be 272 which is borderline low. She will benefit from supplementation.  She was given 1 dose of IM cyanocobalamin.  Will need oral supplementation when she is able to take orally.  Folate level 21.2. TSH noted to be normal at 1.89 EEG was done on 1/7 which showed periodic discharges with triphasic morphology was noted. Per report it is on the ictal/interictal continuum.  Since patient was started to have risk factors for seizure activity she was given a trial of Keppra however she had worsening in her mentation so Keppra was discontinued yesterday.  No overt seizure activity has been noted. Patient's mental status has not improved much.  Prognosis is guarded to poor.  We will continue to hydrate her.  She has had 2 different CT scans done during this hospitalization.  She does not have any focal deficits.  Do not think MRI will change management.  Continue conservative management for now.    COVID-19 infection From a COVID-19 standpoint patient seems to be stable.  She has minimal oxygen requirements which is probably due to aspiration.  See discussion below.   She was treated with Remdesivir and steroid during previous hospitalization. She initially tested positive on December 29.  Stable for the most part from a COVID-19 standpoint.  Isolation was discontinued by ID physician, Dr. Baxter Flattery  Hypoxia/aspiration pneumonia Patient remains on oxygen by nasal cannula..  Chest x-ray suggested either atelectasis or or mild edema or atypical infection.  Patient  was started on Unasyn.    Atrial fibrillation with RVR Known history of atrial fibrillation.  Patient was noted to be in RVR.  Started on diltiazem infusion with good heart rate control.  Cannot transition to oral due to altered mental status.   Due to significant history of anemia she was never placed on anticoagulation previously.  Was on  aspirin. Will defer to outpatient providers. Noted to have bradycardia this morning so Cardizem was held.  Discussed with nursing staff.  If heart rate starts to trend upwards will need to be restarted at a lower rate.  Chronic kidney disease stage V/metabolic acidosis/hypernatremia Patient's baseline creatinine is around 4.5.  She has had worsening renal function over the last 72 hours.  She was also noted to have metabolic acidosis and had to be started on a bicarbonate infusion which has been continued.   Urine output dropped off.  Foley catheter was placed.  IV fluid rate was increased.  Urine output appears to have picked up some.  Creatinine however noted to be higher today.  Continue to check labs daily.   Patient has previously declined hemodialysis.  Nephrology not involved since her overall prognosis is thought to be guarded to poor.   Severe hypokalemia Potassium improved.  Noted to be lower today.  We will be cautious in supplementing due to her rising creatinine.  Peripheral artery disease She is status post left AKA.  Seems to be stable currently.  Normocytic anemia Drop in hemoglobin likely combination of dilution and acute illness.  No overt bleeding noted.  Hemoglobin is stable.  Hold off on transfusions   Chronic diastolic CHF Monitor volume status.  No evidence for volume overload currently.  If anything she is hypokalemic requiring IV fluids.  History of pulmonary hypertension Seems to be stable.  Monitor.  Constipation Significant stool burden noted on imaging studies.  She was given enema.  Since she is encephalopathic she is not able to take medications orally.  May need to use more enemas and suppositories.  Leukocytosis Probably due to aspiration pneumonia.  Lactic acidosis Lactic acid was noted to be elevated at 3.2.  Improved to 2.7.    Hypoalbuminemia Likely due to poor nutrition.  Dysphagia This is primarily due to alteration in mental status.  Family  concerned about nutrition.  We will place core track feeding tube and initiate tube feedings.  Poor venous access Patient requiring multiple IV medications and fluids. PICC line was considered. She has chronic kidney disease. However she has previously mentioned that she did not want to go on hemodialysis. This was confirmed with the patient's daughter.  PICC line was ordered subsequently and placed on 1/8.  Obesity Estimated body mass index is 30.1 kg/m as calculated from the following:   Height as of this encounter: 5\' 8"  (1.727 m).   Weight as of this encounter: 89.8 kg.  Goals of care Yesterday patient was thought to be actively declining.  Her mentation remains altered and her renal function was worsening.  CODE STATUS was discussed with multiple children.  Patient apparently has 10 children.  [name] the oldest is the primary Media planner.  CODE STATUS was changed to DNR yesterday.  Prognosis remains guarded to poor.  Will consult palliative care to assist with management  DVT Prophylaxis: Subcutaneous heparin Code Status: DNR Family Communication: Daughter updated today.   Disposition Plan: Unclear due to worsening clinical status.  Status is: Inpatient  Remains inpatient appropriate because:Altered mental status, Ongoing diagnostic testing  needed not appropriate for outpatient work up and IV treatments appropriate due to intensity of illness or inability to take PO   Dispo: The patient is from: Home              Anticipated d/c is to: SNF              Anticipated d/c date is: 3 days              Patient currently is not medically stable to d/c.     Medications:  Scheduled: . Chlorhexidine Gluconate Cloth  6 each Topical Daily  . heparin  5,000 Units Subcutaneous Q8H  . lactulose  300 mL Rectal Daily  . sodium chloride flush  10-40 mL Intracatheter Q12H   Continuous: . sodium chloride 50 mL/hr at 04/11/20 1800  . ampicillin-sulbactam (UNASYN) IV 1.5 g (04/12/20 0211)   . diltiazem (CARDIZEM) infusion Stopped (04/12/20 0753)  . sodium bicarbonate in D5W 1000 mL infusion 75 mL/hr at 04/11/20 2138   ENM:[id number] **OR** acetaminophen, albuterol, bisacodyl, haloperidol, haloperidol lactate, metoprolol tartrate, sodium chloride flush   Objective:  Vital Signs  Vitals:   04/12/20 0730 04/12/20 0731 04/12/20 0745 04/12/20 0833  BP:    124/79  Pulse: (!) 53 (!) 35 (!) 57 70  Resp: 12 12 13 17   Temp:    98 F (36.7 C)  TempSrc:    Axillary  SpO2: 98% 99% 98% 99%  Weight:      Height:        Intake/Output Summary (Last 24 hours) at 04/12/2020 1023 Last data filed at 04/11/2020 1800 Gross per 24 hour  Intake 997.02 ml  Output 75 ml  Net 922.02 ml   Filed Weights   04/17/2020 1632  Weight: 89.8 kg    General appearance: Remains lethargic. Resp: Mild tachypnea.  Diminished air entry at the bases.  No wheezing or rhonchi. Cardio: S1-S2 is irregularly irregular.  No S3-S4.  No rubs murmurs or bruit GI: Abdomen is soft.  Nontender nondistended.  Bowel sounds are present normal.  No masses organomegaly Extremities: Left AKA Neurologic: Remains encephalopathic.  No obvious focal deficits      Lab Results:  Data Reviewed: I have personally reviewed following labs and imaging studies  CBC: Recent Labs  Lab 04/10/2020 1318 04/19/2020 1346 04/08/20 0725 04/09/20 0418 04/10/20 0254 04/11/20 0500 04/12/20 0512  WBC 13.5*  --  15.0* 9.7 14.6* 15.2* 11.2*  NEUTROABS 9.7*  --  10.8*  --   --   --   --   HGB 10.5*   < > 8.7* 9.1* 7.5* 7.9* 8.5*  HCT 34.3*   < > 28.3* 29.9* 23.0* 25.4* 27.3*  MCV 96.6  --  94.0 94.0 90.6 92.7 92.2  PLT 208  --  187 158 154 160 141*   < > = values in this interval not displayed.    Basic Metabolic Panel: Recent Labs  Lab 04/08/20 0030 04/08/20 0549 04/08/20 0725 04/09/20 0418 04/10/20 0254 04/10/20 1732 04/11/20 0500 04/12/20 0512  NA  --   --  148* 149* 142  --  146* 142  K  --   --  4.4 4.1 2.6*  3.3* 3.0* 3.3*  CL  --   --  116* 116* 108  --  111 105  CO2  --   --  11* 16* 22  --  21* 23  GLUCOSE  --   --  95 110* 276*  --  118* 112*  BUN  --   --  68* 68* 61*  --  65* 63*  CREATININE  --   --  4.77* 4.76* 4.52*  --  5.13* 5.31*  CALCIUM  --   --  8.6* 8.5* 7.3*  --  7.9* 7.7*  MG 2.0 2.1  --   --   --   --   --   --   PHOS  --  4.2  --   --   --   --   --   --     GFR: Estimated Creatinine Clearance: 10.9 mL/min (A) (by C-G formula based on SCr of 5.31 mg/dL (H)).  Liver Function Tests: Recent Labs  Lab 04/03/2020 1318 04/08/20 0725  AST 39 42*  ALT 24 27  ALKPHOS 118 119  BILITOT 0.9 1.0  PROT 7.6 7.3  ALBUMIN 2.8* 2.8*    Recent Labs  Lab 04/08/20 0030 04/10/20 0254 04/11/20 0500  AMMONIA 58* 74* 58*    Coagulation Profile: Recent Labs  Lab 04/08/20 0029  INR 1.1    Cardiac Enzymes: Recent Labs  Lab 04/08/20 0030  CKTOTAL 318*    CBG: Recent Labs  Lab 04/11/20 2146 04/11/20 2319 04/12/20 0353 04/12/20 0710 04/12/20 0835  GLUCAP 104* 95 98 96 105*      Recent Results (from the past 240 hour(s))  Culture, blood (single)     Status: None   Collection Time: 04/09/2020  1:27 PM   Specimen: BLOOD LEFT FOREARM  Result Value Ref Range Status   Specimen Description BLOOD LEFT FOREARM  Final   Special Requests   Final    BOTTLES DRAWN AEROBIC AND ANAEROBIC Blood Culture results may not be optimal due to an inadequate volume of blood received in culture bottles   Culture   Final    NO GROWTH 5 DAYS Performed at [facility], [place] [address], [place], [place] [address]    Report Status 04/12/2020 FINAL  Final  Urine culture     Status: None   Collection Time: 04/20/2020  3:02 PM   Specimen: Urine, Random  Result Value Ref Range Status   Specimen Description URINE, RANDOM  Final   Special Requests NONE  Final   Culture   Final    NO GROWTH Performed at [facility], [place] [address], [place], [place] [address]    Report Status  04/08/2020 FINAL  Final      Radiology Studies: DG CHEST PORT 1 VIEW  Result Date: 04/10/2020 CLINICAL DATA:  Status post PICC line placement EXAM: PORTABLE CHEST 1 VIEW COMPARISON:  April 10, 2020 FINDINGS: The right PICC line is been advanced terminating in the central SVC. Stable cardiomegaly. The hila and mediastinum normal. Pulmonary venous congestion/mild edema persists. No focal infiltrate. No other acute abnormalities. IMPRESSION: Interval advancement of right PICC line into the central SVC. Electronically Signed   By: Dorise Bullion III M.D   On: 04/10/2020 14:36       LOS: 4 days   [facility] Pager on [email or web address]  04/12/2020, 10:23 AM

## 2020-04-13 DIAGNOSIS — E876 Hypokalemia: Secondary | ICD-10-CM | POA: Diagnosis not present

## 2020-04-13 LAB — BASIC METABOLIC PANEL
Anion gap: 13 (ref 5–15)
BUN: 55 mg/dL — ABNORMAL HIGH (ref 8–23)
CO2: 27 mmol/L (ref 22–32)
Calcium: 7.1 mg/dL — ABNORMAL LOW (ref 8.9–10.3)
Chloride: 99 mmol/L (ref 98–111)
Creatinine, Ser: 5.12 mg/dL — ABNORMAL HIGH (ref 0.44–1.00)
GFR, Estimated: 8 mL/min — ABNORMAL LOW (ref >60)
Glucose, Bld: 290 mg/dL — ABNORMAL HIGH (ref 70–99)
Potassium: 2.7 mmol/L — CL (ref 3.5–5.1)
Sodium: 139 mmol/L (ref 135–145)

## 2020-04-13 LAB — GLUCOSE, CAPILLARY
Glucose-Capillary: 134 mg/dL — ABNORMAL HIGH (ref 70–99)
Glucose-Capillary: 95 mg/dL (ref 70–99)
Glucose-Capillary: 112 mg/dL — ABNORMAL HIGH (ref 70–99)
Glucose-Capillary: 98 mg/dL (ref 70–99)
Glucose-Capillary: 94 mg/dL (ref 70–99)
Glucose-Capillary: 84 mg/dL (ref 70–99)

## 2020-04-13 LAB — BLOOD GAS, ARTERIAL
Acid-Base Excess: 2.9 mmol/L — ABNORMAL HIGH (ref 0.0–2.0)
Bicarbonate: 25.7 mmol/L (ref 20.0–28.0)
Drawn by: 27011
FIO2: 32
O2 Saturation: 87.4 %
Patient temperature: 37.3
pCO2 arterial: 31.8 mmHg — ABNORMAL LOW (ref 32.0–48.0)
pH, Arterial: 7.52 — ABNORMAL HIGH (ref 7.350–7.450)
pO2, Arterial: 53.1 mmHg — ABNORMAL LOW (ref 83.0–108.0)

## 2020-04-13 LAB — CBC
HCT: 23.1 % — ABNORMAL LOW (ref 36.0–46.0)
Hemoglobin: 7.4 g/dL — ABNORMAL LOW (ref 12.0–15.0)
MCH: 29.2 pg (ref 26.0–34.0)
MCHC: 32 g/dL (ref 30.0–36.0)
MCV: 91.3 fL (ref 80.0–100.0)
Platelets: 158 10*3/uL (ref 150–400)
RBC: 2.53 MIL/uL — ABNORMAL LOW (ref 3.87–5.11)
RDW: 17.2 % — ABNORMAL HIGH (ref 11.5–15.5)
WBC: 13 10*3/uL — ABNORMAL HIGH (ref 4.0–10.5)
nRBC: 0.7 % — ABNORMAL HIGH (ref 0.0–0.2)

## 2020-04-13 LAB — MAGNESIUM: Magnesium: 1.6 mg/dL — ABNORMAL LOW (ref 1.7–2.4)

## 2020-04-13 LAB — AMMONIA: Ammonia: 46 umol/L — ABNORMAL HIGH (ref 9–35)

## 2020-04-13 MED ORDER — POTASSIUM CHLORIDE 10 MEQ/100ML IV SOLN
INTRAVENOUS | Status: AC
  Administered 2020-04-13: 10 meq via INTRAVENOUS
  Filled 2020-04-13: qty 100

## 2020-04-13 MED ORDER — FUROSEMIDE 10 MG/ML IJ SOLN
80.0000 mg | Freq: Once | INTRAMUSCULAR | Status: AC
  Administered 2020-04-13: 80 mg via INTRAVENOUS
  Filled 2020-04-13: qty 8

## 2020-04-13 MED ORDER — KCL IN DEXTROSE-NACL 40-5-0.9 MEQ/L-%-% IV SOLN
INTRAVENOUS | Status: DC
  Filled 2020-04-13: qty 1000

## 2020-04-13 MED ORDER — POTASSIUM CHLORIDE 10 MEQ/100ML IV SOLN
10.0000 meq | INTRAVENOUS | Status: AC
  Administered 2020-04-13 (×3): 10 meq via INTRAVENOUS
  Filled 2020-04-13 (×2): qty 100

## 2020-04-13 MED ORDER — METOPROLOL TARTRATE 5 MG/5ML IV SOLN
5.0000 mg | Freq: Four times a day (QID) | INTRAVENOUS | Status: DC
  Administered 2020-04-13 – 2020-04-16 (×11): 5 mg via INTRAVENOUS
  Filled 2020-04-13 (×10): qty 5

## 2020-04-13 MED ORDER — POTASSIUM CHLORIDE CRYS ER 20 MEQ PO TBCR
30.0000 meq | EXTENDED_RELEASE_TABLET | Freq: Once | ORAL | Status: DC
  Filled 2020-04-13: qty 1

## 2020-04-13 MED ORDER — POTASSIUM CHLORIDE 10 MEQ/100ML IV SOLN
INTRAVENOUS | Status: AC
  Filled 2020-04-13: qty 100

## 2020-04-13 MED ORDER — MAGNESIUM SULFATE 4 GM/100ML IV SOLN
4.0000 g | Freq: Once | INTRAVENOUS | Status: AC
  Administered 2020-04-13: 4 g via INTRAVENOUS
  Filled 2020-04-13: qty 100

## 2020-04-13 NOTE — Progress Notes (Signed)
PT Cancellation Note  Patient Details Name: Stephanie Griffith MRN: [id number] DOB: Jul 05, 1945   Cancelled Treatment:    Reason Eval/Treat Not Completed: Fatigue/lethargy limiting ability to participate;Patient's level of consciousness;Patient declined, no reason specified. Pt's daughter stated "I am not letting anyone do therapy with her today. She is lethargic and does not need therapy when she is like this", but did request PT continue to follow-up as able day by day. Will follow-up another day as able.  Moishe Spice, PT, DPT Acute Rehabilitation Services  Pager: [phone] Office: [place] 04/13/2020, 3:41 PM

## 2020-04-13 NOTE — Plan of Care (Signed)
  Problem: Education: Goal: Knowledge of General Education information will improve Description: Including pain rating scale, medication(s)/side effects and non-pharmacologic comfort measures Outcome: Not Progressing   Problem: Health Behavior/Discharge Planning: Goal: Ability to manage health-related needs will improve Outcome: Not Progressing   Problem: Clinical Measurements: Goal: Cardiovascular complication will be avoided Outcome: Not Progressing   Problem: Activity: Goal: Risk for activity intolerance will decrease Outcome: Not Progressing   Problem: Coping: Goal: Level of anxiety will decrease Outcome: Not Progressing   Problem: Pain Managment: Goal: General experience of comfort will improve Outcome: Not Progressing

## 2020-04-13 NOTE — Procedures (Signed)
Cortrak  Person Inserting Tube:  Stephanie Griffith, Stephanie Griffith Tube Type:  Cortrak - 43 inches Tube Location:  Left nare Initial Placement:  Stomach Secured by: Bridle Technique Used to Measure Tube Placement:  Documented cm marking at nare/ corner of mouth Cortrak Secured At:  70 cm   Cortrak Tube Team Note:  Consult received to place a Cortrak feeding tube.   No x-ray is required. RN may begin using tube.   If the tube becomes dislodged please keep the tube and contact the Cortrak team at [email or web address] (password [email or web address]) for replacement.  If after hours and replacement cannot be delayed, place a NG tube and confirm placement with an abdominal x-ray.    Stephanie Ingerson, MS, Stephanie Griffith, LDN Stephanie Griffith pager number and weekend/on-call pager number located in Amion.    

## 2020-04-13 NOTE — Progress Notes (Signed)
CRITICAL VALUE ALERT  Critical Value:  Potassium 2.7  Date & Time Notied:  04/13/2020,  1102  Provider Notified: MD, Ninetta Lights  Orders Received/Actions taken: awaiting response

## 2020-04-13 NOTE — Progress Notes (Addendum)
PROGRESS NOTE    Stephanie Griffith   HOZ:[id number]  DOB: [birth year]  DOA: 04/19/2020 PCP: [name], MD   Brief Narrative:  Stephanie Griffith 75 year old African-American female with a past medical history of chronic diastolic CHF, chronic kidney disease stage V, history of COPD, hyperlipidemia, essential hypertension, paroxysmal atrial fibrillation, pulmonary hypertension who was recently hospitalized for pneumonia due to COVID-19 and was discharged on January 3.  She was discharged home per family request.  Recommendation was for her to go to SNF.  Presented back to the hospital with worsening confusion.   Subjective: Quite somnolent and not opening eyes today.     Assessment & Plan:   Principal Problem:   Acute metabolic encephalopathy - Noted to have encephalopathy during last hospitalization which was for COVID - suspecting underlying vascular dementia - despite giving Lactulose and ammonia improving to 46, she is more somnolent today- check ABG - ? If uremia is playing a part in her lethargic state now - d/c Lactulose (K is 2.7 today) - imaging of brain unrevealing - EEG showing triphasic morphology was noted. Per report it is on the ictal/interictal continuum. Started on Keppra but mentation worsened and thus it was stopped.  - family asking for a temporary feeding tube which has been requested - palliative care consulted  Active Problems: Recent COVID 19 infection- tested + on 03/30/20 - treated with Remdesivir and steroids  Severe Hypokalemia, hypomagnesemia - K 2.7-Mg 1.6 -  cont to replace via IV  Aspiration pneumonia - started on Unasyn on 1/9 - blood cultures for 1/6 are negative - on 3 L O2  CKD 5 - she previously declined hemodialysis - started on a bicarb infusion for metabolic acidosis  Normocytic anemia - likely anemia of chronic disease    Poor venous access - PICC line placed on 1/8  Addendum: RR in 20s, daughter concerned that patient is more  lethargic today. She states that the patient ate very well. I did explain to her that it is likely that she aspirated yesterday.   ABG show respiratory alkalosis and hypoxia today.  The daughter states she is a [employer] scientist. She feels that her mother's face if swollen and is asking for Lasix. At this point, with declining renal function, poor urine output (only 265 cc today), she may be holding on to fluid. I will go ahead and stop IVF and start Lasix. Can start with 80 mg but I suspect she will need a dose of 120 mg to produce urine as her Cr is > 5.  Her prognosis is very poor.   Time spent in minutes: 60 DVT prophylaxis: heparin injection 5,000 Units Start: 04/08/20 0730  Code Status: DNR Family Communication:  Disposition Plan:  Status is: Inpatient  Remains inpatient appropriate because:IV treatments appropriate due to intensity of illness or inability to take PO   Dispo: The patient is from: Home              Anticipated d/c is to: TBD              Anticipated d/c date is: > 3 days              Patient currently is not medically stable to d/c.      Consultants:   None Procedures:   none Antimicrobials:  Anti-infectives (From admission, onward)   Start     Dose/Rate Route Frequency Ordered Stop   04/10/20 1330  ampicillin-sulbactam (UNASYN) 1.5 g in sodium chloride 0.9 %  100 mL IVPB        1.5 g 200 mL/hr over 30 Minutes Intravenous Every 12 hours 04/10/20 1232 04/15/20 1329       Objective: Vitals:   04/13/20 0036 04/13/20 1130 04/13/20 1203 04/13/20 1224  BP: (!) 134/58 (!) 149/70 133/64 138/86  Pulse: (!) 106  69   Resp: 18  (!) 22   Temp: 97.8 F (36.6 C)  99.1 F (37.3 C) 99.1 F (37.3 C)  TempSrc: Axillary  Oral   SpO2: 100%  (!) 89% 94%  Weight:      Height:        Intake/Output Summary (Last 24 hours) at 04/13/2020 1334 Last data filed at 04/12/2020 1700 Gross per 24 hour  Intake 1124.06 ml  Output 265 ml  Net 859.06 ml   Filed Weights   05/01/2020  1632  Weight: 89.8 kg    Examination: General exam: lethargic HEENT: PERRL  no sclera icterus or thrush Respiratory system: very poor air movement- shallow rapid breaths  Cardiovascular system: S1 & S2 heard, RRR.   Gastrointestinal system: Abdomen soft, non-tender, nondistended. Normal bowel sounds. Central nervous system: lethargic, flaccid extermities Extremities: No cyanosis, clubbing or edema Skin: No rashes or ulcers Psychiatry:  Cannot assess     Data Reviewed: I have personally reviewed following labs and imaging studies  CBC: Recent Labs  Lab 04/18/2020 1318 04/06/2020 1346 04/08/20 0725 04/09/20 0418 04/10/20 0254 04/11/20 0500 04/12/20 0512 04/13/20 0452  WBC 13.5*  --  15.0* 9.7 14.6* 15.2* 11.2* 13.0*  NEUTROABS 9.7*  --  10.8*  --   --   --   --   --   HGB 10.5*   < > 8.7* 9.1* 7.5* 7.9* 8.5* 7.4*  HCT 34.3*   < > 28.3* 29.9* 23.0* 25.4* 27.3* 23.1*  MCV 96.6  --  94.0 94.0 90.6 92.7 92.2 91.3  PLT 208  --  187 158 154 160 141* 158   < > = values in this interval not displayed.   Basic Metabolic Panel: Recent Labs  Lab 04/08/20 0030 04/08/20 0549 04/08/20 0725 04/09/20 0418 04/10/20 0254 04/10/20 1732 04/11/20 0500 04/12/20 0512 04/13/20 0452 04/13/20 0736  NA  --   --    < > 149* 142  --  146* 142 139  --   K  --   --    < > 4.1 2.6* 3.3* 3.0* 3.3* 2.7*  --   CL  --   --    < > 116* 108  --  111 105 99  --   CO2  --   --    < > 16* 22  --  21* 23 27  --   GLUCOSE  --   --    < > 110* 276*  --  118* 112* 290*  --   BUN  --   --    < > 68* 61*  --  65* 63* 55*  --   CREATININE  --   --    < > 4.76* 4.52*  --  5.13* 5.31* 5.12*  --   CALCIUM  --   --    < > 8.5* 7.3*  --  7.9* 7.7* 7.1*  --   MG 2.0 2.1  --   --   --   --   --   --   --  1.6*  PHOS  --  4.2  --   --   --   --   --   --   --   --    < > =  values in this interval not displayed.   GFR: Estimated Creatinine Clearance: 11.3 mL/min (A) (by C-G formula based on SCr of 5.12 mg/dL  (H)). Liver Function Tests: Recent Labs  Lab 04/25/2020 1318 04/08/20 0725  AST 39 42*  ALT 24 27  ALKPHOS 118 119  BILITOT 0.9 1.0  PROT 7.6 7.3  ALBUMIN 2.8* 2.8*   No results for input(s): LIPASE, AMYLASE in the last 168 hours. Recent Labs  Lab 04/08/20 0030 04/10/20 0254 04/11/20 0500 04/13/20 0454  AMMONIA 58* 74* 58* 46*   Coagulation Profile: Recent Labs  Lab 04/08/20 0029  INR 1.1   Cardiac Enzymes: Recent Labs  Lab 04/08/20 0030  CKTOTAL 318*   BNP (last 3 results) No results for input(s): PROBNP in the last 8760 hours. HbA1C: No results for input(s): HGBA1C in the last 72 hours. CBG: Recent Labs  Lab 04/12/20 2016 04/12/20 2349 04/13/20 0330 04/13/20 0807 04/13/20 1208  GLUCAP 121* 102* 95 112* 98   Lipid Profile: No results for input(s): CHOL, HDL, LDLCALC, TRIG, CHOLHDL, LDLDIRECT in the last 72 hours. Thyroid Function Tests: No results for input(s): TSH, T4TOTAL, FREET4, T3FREE, THYROIDAB in the last 72 hours. Anemia Panel: No results for input(s): VITAMINB12, FOLATE, FERRITIN, TIBC, IRON, RETICCTPCT in the last 72 hours. Urine analysis:    Component Value Date/Time   COLORURINE YELLOW 04/12/2020 1318   APPEARANCEUR CLEAR 04/20/2020 1318   LABSPEC 1.015 04/10/2020 1318   PHURINE 5.0 04/06/2020 1318   GLUCOSEU NEGATIVE 04/21/2020 1318   HGBUR NEGATIVE 04/05/2020 1318   BILIRUBINUR NEGATIVE 05/01/2020 1318   KETONESUR NEGATIVE 04/16/2020 1318   PROTEINUR 100 (A) 04/02/2020 1318   UROBILINOGEN 0.2 06/18/2011 1751   NITRITE NEGATIVE 04/20/2020 1318   LEUKOCYTESUR NEGATIVE 04/14/2020 1318   Sepsis Labs: @LABRCNTIP (procalcitonin:4,lacticidven:4) ) Recent Results (from the past 240 hour(s))  Culture, blood (single)     Status: None   Collection Time: 04/26/2020  1:27 PM   Specimen: BLOOD LEFT FOREARM  Result Value Ref Range Status   Specimen Description BLOOD LEFT FOREARM  Final   Special Requests   Final    BOTTLES DRAWN AEROBIC AND  ANAEROBIC Blood Culture results may not be optimal due to an inadequate volume of blood received in culture bottles   Culture   Final    NO GROWTH 5 DAYS Performed at [facility], [place] [address], [place], [place] [address]    Report Status 04/12/2020 FINAL  Final  Urine culture     Status: None   Collection Time: 04/20/2020  3:02 PM   Specimen: Urine, Random  Result Value Ref Range Status   Specimen Description URINE, RANDOM  Final   Special Requests NONE  Final   Culture   Final    NO GROWTH Performed at [facility], [place] [address], [place], [place] [address]    Report Status 04/08/2020 FINAL  Final         Radiology Studies: No results found.    Scheduled Meds: . Chlorhexidine Gluconate Cloth  6 each Topical Daily  . feeding supplement (PROSource TF)  45 mL Per Tube Daily  . free water  150 mL Per Tube Q6H  . heparin  5,000 Units Subcutaneous Q8H  . metoprolol tartrate  5 mg Intravenous Q6H  . sodium chloride flush  10-40 mL Intracatheter Q12H   Continuous Infusions: . ampicillin-sulbactam (UNASYN) IV 1.5 g (04/13/20 0112)  . dextrose 5 % and 0.9 % NaCl with KCl 40 mEq/L 75  mL/hr at 04/13/20 1205  . diltiazem (CARDIZEM) infusion 5 mg/hr (04/13/20 1037)  . feeding supplement (OSMOLITE 1.2 CAL) Stopped (04/12/20 1546)  . potassium chloride       LOS: 5 days      Debbe Odea, MD [facility] Pager: [email or web address] 04/13/2020, 1:34 PM

## 2020-04-13 NOTE — Progress Notes (Signed)
Palliative-   Thank you for this consult.   Chart reviewed. Contacted patient's daughterArbie Cookey. [place] meeting arranged for tomorrow at [place], AGNP-C Palliative Medicine  Please call Palliative Medicine team phone with any questions [phone]. For individual providers please see AMION.  No charge

## 2020-04-14 ENCOUNTER — Other Ambulatory Visit: Payer: Self-pay | Admitting: Nurse Practitioner

## 2020-04-14 DIAGNOSIS — E876 Hypokalemia: Secondary | ICD-10-CM | POA: Diagnosis not present

## 2020-04-14 DIAGNOSIS — Z7189 Other specified counseling: Secondary | ICD-10-CM | POA: Diagnosis not present

## 2020-04-14 LAB — GLUCOSE, CAPILLARY
Glucose-Capillary: 75 mg/dL (ref 70–99)
Glucose-Capillary: 77 mg/dL (ref 70–99)
Glucose-Capillary: 81 mg/dL (ref 70–99)
Glucose-Capillary: 84 mg/dL (ref 70–99)
Glucose-Capillary: 84 mg/dL (ref 70–99)

## 2020-04-14 LAB — BASIC METABOLIC PANEL
Anion gap: 15 (ref 5–15)
BUN: 63 mg/dL — ABNORMAL HIGH (ref 8–23)
CO2: 24 mmol/L (ref 22–32)
Calcium: 8 mg/dL — ABNORMAL LOW (ref 8.9–10.3)
Chloride: 103 mmol/L (ref 98–111)
Creatinine, Ser: 5.78 mg/dL — ABNORMAL HIGH (ref 0.44–1.00)
GFR, Estimated: 7 mL/min — ABNORMAL LOW (ref >60)
Glucose, Bld: 82 mg/dL (ref 70–99)
Potassium: 3.8 mmol/L (ref 3.5–5.1)
Sodium: 142 mmol/L (ref 135–145)

## 2020-04-14 MED ORDER — DARBEPOETIN ALFA 200 MCG/0.4ML IJ SOSY
200.0000 ug | PREFILLED_SYRINGE | INTRAMUSCULAR | Status: DC
  Administered 2020-04-21: 200 ug via SUBCUTANEOUS
  Filled 2020-04-14: qty 0.4

## 2020-04-14 MED ORDER — DEXTROSE 50 % IV SOLN
1.0000 | Freq: Once | INTRAVENOUS | Status: AC
  Administered 2020-04-14: 50 mL via INTRAVENOUS
  Filled 2020-04-14: qty 50

## 2020-04-14 NOTE — Consult Note (Signed)
[facility] Renal Consultation Note  Requesting MD: Rizwan Indication for Consultation: advanced CKD-  Some worsening  HPI:  Stephanie Griffith is a 75 y.o. female with past medical history significant for chronic CHF- pulm HTN and atrial fibrillation, PAD- s/p left AKA, HTN, COPD, as well as probable vascular dementia- advanced CKD at baseline crt in the 4's-  Followed by Dr. Hollie Salk at [facility]-  Multiple discussion had been held regarding dialysis and patient had verbalized to Dr. Hollie Salk that she did not desire dialysis understanding the alternative.  Unfortunately she has had 3 hospitalizations over the last 6 weeks-  Always with decreased MS-  At end of December hospitalized with COVID-  Discharged to home per family even though SNF was recommended and she was brought back 3 days later.  This hospitalization she has had the issue with aspiration PNA- waxing and waning MS possibly OK on 1/11 per notes but then decreased again yesterday and today-  It is felt that she has aspirated again-  Daughter Montey Hora is claiming that the aspiration is the fault of the hospital and seems quite angry about it.  Anyway-  Over the course of this hospitalization- renal function has worsened some- with today crt 5.78 and BUN of 63.  The hospitalist called me this afternoon and told me that the family had decided that they wanted dialysis.  There was a meeting with palliative care but nothing documented yet.  There is no family at bedside.  I called and talked to [place] who is very angry -  She said "I hope you are not calling me to talk [place] out of what we want"  She said that she asked her mother today if she wants them to do everything they can to save her and she said yes"  Corinna suggested that I cal [name] who is primarily responsible for making decisions - I called and left a message on her voice mail.  Pt is unresponsive except to much stimuli - opens eyes - no commands- no words  Creatinine, Ser  Date/Time  Value Ref Range Status  04/14/2020 03:58 AM 5.78 (H) 0.44 - 1.00 mg/dL Final  04/13/2020 04:52 AM 5.12 (H) 0.44 - 1.00 mg/dL Final  04/12/2020 05:12 AM 5.31 (H) 0.44 - 1.00 mg/dL Final  04/11/2020 05:00 AM 5.13 (H) 0.44 - 1.00 mg/dL Final  04/10/2020 02:54 AM 4.52 (H) 0.44 - 1.00 mg/dL Final  04/09/2020 04:18 AM 4.76 (H) 0.44 - 1.00 mg/dL Final  04/08/2020 07:25 AM 4.77 (H) 0.44 - 1.00 mg/dL Final  04/13/2020 01:47 PM 4.60 (H) 0.44 - 1.00 mg/dL Final  04/05/2020 01:18 PM 4.45 (H) 0.44 - 1.00 mg/dL Final  04/04/2020 03:35 AM 4.53 (H) 0.44 - 1.00 mg/dL Final  04/03/2020 01:51 AM 4.54 (H) 0.44 - 1.00 mg/dL Final  04/02/2020 05:05 AM 4.57 (H) 0.44 - 1.00 mg/dL Final  04/01/2020 06:00 AM 4.56 (H) 0.44 - 1.00 mg/dL Final  03/31/2020 06:31 AM 4.64 (H) 0.44 - 1.00 mg/dL Final  03/30/2020 02:12 PM 4.73 (H) 0.44 - 1.00 mg/dL Final  03/05/2020 01:49 AM 4.42 (H) 0.44 - 1.00 mg/dL Final  03/04/2020 03:04 AM 4.97 (H) 0.44 - 1.00 mg/dL Final  03/03/2020 10:07 AM 5.08 (H) 0.44 - 1.00 mg/dL Final  03/02/2020 08:50 AM 5.38 (H) 0.44 - 1.00 mg/dL Final  04/07/2019 11:03 AM 3.90 (H) 0.44 - 1.00 mg/dL Final  04/07/2019 09:43 AM 3.57 (H) 0.44 - 1.00 mg/dL Final  02/18/2019 09:51 AM 4.28 (H) 0.44 - 1.00  mg/dL Final  02/17/2019 03:19 AM 4.58 (H) 0.44 - 1.00 mg/dL Final  02/16/2019 05:46 AM 4.38 (H) 0.44 - 1.00 mg/dL Final  02/15/2019 03:56 AM 4.06 (H) 0.44 - 1.00 mg/dL Final  02/14/2019 05:24 AM 4.47 (H) 0.44 - 1.00 mg/dL Final  02/13/2019 10:47 AM 4.24 (H) 0.44 - 1.00 mg/dL Final  02/13/2019 04:27 AM 4.10 (H) 0.44 - 1.00 mg/dL Final  02/12/2019 08:05 AM 4.01 (H) 0.44 - 1.00 mg/dL Final  02/11/2019 03:18 AM 3.79 (H) 0.44 - 1.00 mg/dL Final  02/10/2019 03:51 AM 3.88 (H) 0.44 - 1.00 mg/dL Final  02/09/2019 04:35 AM 3.74 (H) 0.44 - 1.00 mg/dL Final  02/08/2019 04:01 AM 3.42 (H) 0.44 - 1.00 mg/dL Final  02/07/2019 03:42 AM 3.36 (H) 0.44 - 1.00 mg/dL Final  02/05/2019 06:05 PM 3.17 (H) 0.44 - 1.00 mg/dL Final   05/30/2017 06:25 PM 2.06 (H) 0.44 - 1.00 mg/dL Final  01/10/2016 05:50 AM 1.87 (H) 0.44 - 1.00 mg/dL Final  01/08/2016 03:08 AM 2.15 (H) 0.44 - 1.00 mg/dL Final  01/07/2016 05:00 AM 2.20 (H) 0.44 - 1.00 mg/dL Final  01/06/2016 02:47 AM 2.12 (H) 0.44 - 1.00 mg/dL Final  01/05/2016 04:29 AM 2.34 (H) 0.44 - 1.00 mg/dL Final  01/04/2016 03:20 AM 2.65 (H) 0.44 - 1.00 mg/dL Final  01/03/2016 09:03 AM 2.84 (H) 0.44 - 1.00 mg/dL Final  01/02/2016 03:40 AM 2.79 (H) 0.44 - 1.00 mg/dL Final  01/01/2016 06:40 PM 2.66 (H) 0.44 - 1.00 mg/dL Final    Comment:    DELTA CHECK NOTED  01/01/2016 05:23 PM 1.61 (H) 0.44 - 1.00 mg/dL Final  01/01/2016 04:00 AM 2.23 (H) 0.44 - 1.00 mg/dL Final  12/31/2015 04:57 PM 1.66 (H) 0.44 - 1.00 mg/dL Final  12/31/2015 09:21 AM 2.00 (H) 0.44 - 1.00 mg/dL Final  12/31/2015 08:01 AM 1.77 (H) 0.44 - 1.00 mg/dL Final  12/31/2015 02:10 AM 1.96 (H) 0.44 - 1.00 mg/dL Final  12/30/2015 08:57 PM 1.92 (H) 0.44 - 1.00 mg/dL Final     PMHx:   Past Medical History:  Diagnosis Date  . Anemia of chronic disease   . Chronic diastolic CHF (congestive heart failure) ([place])   . Chronic renal insufficiency, stage IV (severe) ([place])   . COPD (chronic obstructive pulmonary disease) ([place])   . Hyperglycemia   . Hyperlipidemia   . Hypertension   . PAD (peripheral artery disease) ([place])    a. s/p femoral to below knee popliteal bypass then L AKA.  Marland Kitchen PAF (paroxysmal atrial fibrillation) ([place])   . PAT (paroxysmal atrial tachycardia) ([place])   . Pulmonary hypertension ([place])   . Umbilical hernia     Past Surgical History:  Procedure Laterality Date  . AMPUTATION  06/06/2011   Procedure: AMPUTATION DIGIT;  Surgeon: Elam Dutch, MD;  Location: [facility] OR;  Service: Vascular;  Laterality: Left;  Transmetatarsal amputation left great toe  . AMPUTATION  06/12/2011   Procedure: AMPUTATION ABOVE KNEE;  Surgeon: Elam Dutch, MD;  Location: [facility];  Service: Vascular;  Laterality: Left;  .  FEMORAL-POPLITEAL BYPASS GRAFT  06/06/2011   Procedure: BYPASS GRAFT FEMORAL-POPLITEAL ARTERY;  Surgeon: Elam Dutch, MD;  Location: [facility] OR;  Service: Vascular;  Laterality: Left;  left femoral to popliteal bypass with composite vein and propaten 19mm x 80 graft  . LOWER EXTREMITY ANGIOGRAM N/A 06/04/2011   Procedure: LOWER EXTREMITY ANGIOGRAM;  Surgeon: Angelia Mould, MD;  Location: [facility] CATH LAB;  Service: Cardiovascular;  Laterality: N/A;  . OMENTECTOMY  12/31/2015   Procedure: Partial OMENTECTOMY;  Surgeon: Georganna Skeans, MD;  Location: [place];  Service: General;;  . RIGHT HEART CATH N/A 02/13/2019   Procedure: RIGHT HEART CATH;  Surgeon: Jolaine Artist, MD;  Location: [place] CV LAB;  Service: Cardiovascular;  Laterality: N/A;  . TUBAL LIGATION    . VENTRAL HERNIA REPAIR N/A 12/31/2015   Procedure: REPAIR INCARCERATED VENTRAL HERNIA, POSSIBLE BOWEL RESECTION;  Surgeon: Georganna Skeans, MD;  Location: [facility] OR;  Service: General;  Laterality: N/A;    Family Hx:  Family History  Problem Relation Age of Onset  . Heart attack Father   . Heart disease Father   . Diabetes Mother   . Hypertension Mother   . Heart attack Brother     Social History:  reports that she quit smoking about 8 years ago. She has a 100.00 pack-year smoking history. She has never used smokeless tobacco. She reports that she does not drink alcohol and does not use drugs.  Allergies:  Allergies  Allergen Reactions  . Morphine And Related Other (See Comments)    AMS    Medications: Prior to Admission medications   Medication Sig Start Date End Date Taking? Authorizing Provider  amLODipine (NORVASC) 10 MG tablet Take 10 mg by mouth daily.  07/08/14  Yes [provider]  aspirin 81 MG EC tablet Take 81 mg by mouth daily.   Yes [provider]  atorvastatin (LIPITOR) 40 MG tablet Take 1 tablet (40 mg total) by mouth daily at 6 PM. Patient taking differently: Take 40 mg by mouth daily.  02/18/19 03/02/20 Yes Ghimire, Dante Gang, MD  calcitRIOL (ROCALTROL) 0.5 MCG capsule Take 0.5 mcg by mouth daily. 02/22/20  Yes [provider]  furosemide (LASIX) 40 MG tablet Take 1 tablet (40 mg total) by mouth daily. 02/19/19  Yes Barb Merino, MD  gabapentin (NEURONTIN) 100 MG capsule Take 100 mg by mouth 2 (two) times daily. 06/23/14  Yes [provider]  metoprolol tartrate (LOPRESSOR) 100 MG tablet Take 1 tablet (100 mg total) by mouth 2 (two) times daily. 04/04/20  Yes Thurnell Lose, MD  Multiple Vitamin (MULTIVITAMIN WITH MINERALS) TABS tablet Take 1 tablet by mouth daily.   Yes [provider]  sodium bicarbonate 650 MG tablet Take 650 mg by mouth in the morning, at noon, and at bedtime. 12/29/19  Yes [provider]  albuterol (VENTOLIN HFA) 108 (90 Base) MCG/ACT inhaler Inhale 2 puffs into the lungs every 6 (six) hours as needed for wheezing or shortness of breath. Patient not taking: Reported on 04/26/2020 04/04/20   Thurnell Lose, MD    I have reviewed the patient's current medications.  Labs:  Results for orders placed or performed during the hospital encounter of 04/14/2020 (from the past 48 hour(s))  Glucose, capillary     Status: Abnormal   Collection Time: 04/12/20  5:05 PM  Result Value Ref Range   Glucose-Capillary 134 (H) 70 - 99 mg/dL    Comment: Glucose reference range applies only to samples taken after fasting for at least 8 hours.   Comment 1 Notify RN    Comment 2 Document in Chart   Glucose, capillary     Status: Abnormal   Collection Time: 04/12/20  8:16 PM  Result Value Ref Range   Glucose-Capillary 121 (H) 70 - 99 mg/dL    Comment: Glucose reference range applies only to samples taken after fasting for at least 8 hours.   Comment 1 Notify  RN    Comment 2 Document in Chart   Glucose, capillary     Status: Abnormal   Collection Time: 04/12/20 11:49 PM  Result Value Ref Range   Glucose-Capillary 102 (H) 70 - 99 mg/dL     Comment: Glucose reference range applies only to samples taken after fasting for at least 8 hours.   Comment 1 Notify RN    Comment 2 Document in Chart   Glucose, capillary     Status: None   Collection Time: 04/13/20  3:30 AM  Result Value Ref Range   Glucose-Capillary 95 70 - 99 mg/dL    Comment: Glucose reference range applies only to samples taken after fasting for at least 8 hours.   Comment 1 Notify RN    Comment 2 Document in Chart   CBC     Status: Abnormal   Collection Time: 04/13/20  4:52 AM  Result Value Ref Range   WBC 13.0 (H) 4.0 - 10.5 K/uL   RBC 2.53 (L) 3.87 - 5.11 MIL/uL   Hemoglobin 7.4 (L) 12.0 - 15.0 g/dL   HCT 23.1 (L) 36.0 - 46.0 %   MCV 91.3 80.0 - 100.0 fL   MCH 29.2 26.0 - 34.0 pg   MCHC 32.0 30.0 - 36.0 g/dL   RDW 17.2 (H) 11.5 - 15.5 %   Platelets 158 150 - 400 K/uL   nRBC 0.7 (H) 0.0 - 0.2 %    Comment: Performed at [facility], [place] [address] [place], [place] [address]  Basic metabolic panel     Status: Abnormal   Collection Time: 04/13/20  4:52 AM  Result Value Ref Range   Sodium 139 135 - 145 mmol/L   Potassium 2.7 (LL) 3.5 - 5.1 mmol/L    Comment: CRITICAL RESULT CALLED TO, READ BACK BY AND VERIFIED WITH: OFORI E,RN 04/13/20 0547 WAYK    Chloride 99 98 - 111 mmol/L   CO2 27 22 - 32 mmol/L   Glucose, Bld 290 (H) 70 - 99 mg/dL    Comment: Glucose reference range applies only to samples taken after fasting for at least 8 hours.   BUN 55 (H) 8 - 23 mg/dL   Creatinine, Ser 5.12 (H) 0.44 - 1.00 mg/dL   Calcium 7.1 (L) 8.9 - 10.3 mg/dL   GFR, Estimated 8 (L) >60 mL/min    Comment: (NOTE) Calculated using the CKD-EPI Creatinine Equation (2021)    Anion gap 13 5 - 15    Comment: Performed at [facility] [address], [place], [place] [address]  Ammonia     Status: Abnormal   Collection Time: 04/13/20  4:54 AM  Result Value Ref Range   Ammonia 46 (H) 9 - 35 umol/L    Comment: Performed at [facility], [place] [address] [place], [place] [address]  Magnesium     Status: Abnormal   Collection Time: 04/13/20  7:36 AM  Result Value Ref Range   Magnesium 1.6 (L) 1.7 - 2.4 mg/dL    Comment: Performed at [facility] [address] [place], [place] [address]  Glucose, capillary     Status: Abnormal   Collection Time: 04/13/20  8:07 AM  Result Value Ref Range   Glucose-Capillary 112 (H) 70 - 99 mg/dL    Comment: Glucose reference range applies only to samples taken after fasting for at least 8 hours.   Comment 1 Notify RN    Comment 2 Document in  Chart   Glucose, capillary     Status: None   Collection Time: 04/13/20 12:08 PM  Result Value Ref Range   Glucose-Capillary 98 70 - 99 mg/dL    Comment: Glucose reference range applies only to samples taken after fasting for at least 8 hours.   Comment 1 Notify RN    Comment 2 Document in Chart   Blood gas, arterial     Status: Abnormal   Collection Time: 04/13/20  1:20 PM  Result Value Ref Range   FIO2 32.00    pH, Arterial 7.520 (H) 7.350 - 7.450   pCO2 arterial 31.8 (L) 32.0 - 48.0 mmHg   pO2, Arterial 53.1 (L) 83.0 - 108.0 mmHg   Bicarbonate 25.7 20.0 - 28.0 mmol/L   Acid-Base Excess 2.9 (H) 0.0 - 2.0 mmol/L   O2 Saturation 87.4 %   Patient temperature 37.3    Collection site LEFT BRACHIAL    Drawn by [id number]    Sample type ARTERIAL DRAW    Allens test (pass/fail) BRACHIAL ARTERY (A) PASS    Comment: Performed at [facility], [place] [address], [place], [place] [address]  Glucose, capillary     Status: None   Collection Time: 04/13/20  4:41 PM  Result Value Ref Range   Glucose-Capillary 94 70 - 99 mg/dL    Comment: Glucose reference range applies only to samples taken after fasting for at least 8 hours.   Comment 1 Notify RN    Comment 2 Document in Chart   Glucose, capillary     Status: None   Collection Time: 04/13/20  8:05 PM  Result Value Ref Range   Glucose-Capillary 84 70 - 99 mg/dL    Comment: Glucose reference range applies only  to samples taken after fasting for at least 8 hours.   Comment 1 Notify RN    Comment 2 Document in Chart   Glucose, capillary     Status: None   Collection Time: 04/14/20 12:31 AM  Result Value Ref Range   Glucose-Capillary 81 70 - 99 mg/dL    Comment: Glucose reference range applies only to samples taken after fasting for at least 8 hours.   Comment 1 Notify RN    Comment 2 Document in Chart   Basic metabolic panel     Status: Abnormal   Collection Time: 04/14/20  3:58 AM  Result Value Ref Range   Sodium 142 135 - 145 mmol/L   Potassium 3.8 3.5 - 5.1 mmol/L   Chloride 103 98 - 111 mmol/L   CO2 24 22 - 32 mmol/L   Glucose, Bld 82 70 - 99 mg/dL    Comment: Glucose reference range applies only to samples taken after fasting for at least 8 hours.   BUN 63 (H) 8 - 23 mg/dL   Creatinine, Ser 5.78 (H) 0.44 - 1.00 mg/dL   Calcium 8.0 (L) 8.9 - 10.3 mg/dL   GFR, Estimated 7 (L) >60 mL/min    Comment: (NOTE) Calculated using the CKD-EPI Creatinine Equation (2021)    Anion gap 15 5 - 15    Comment: Performed at [facility] [address], [place], [place] [address]  Glucose, capillary     Status: None   Collection Time: 04/14/20  4:15 AM  Result Value Ref Range   Glucose-Capillary 75 70 - 99 mg/dL    Comment: Glucose reference range applies only to samples taken after fasting for at least 8 hours.   Comment 1  Notify RN    Comment 2 Document in Chart   Glucose, capillary     Status: None   Collection Time: 04/14/20  8:01 AM  Result Value Ref Range   Glucose-Capillary 84 70 - 99 mg/dL    Comment: Glucose reference range applies only to samples taken after fasting for at least 8 hours.   Comment 1 Notify RN    Comment 2 Document in Chart   Glucose, capillary     Status: None   Collection Time: 04/14/20 11:50 AM  Result Value Ref Range   Glucose-Capillary 77 70 - 99 mg/dL    Comment: Glucose reference range applies only to samples taken after fasting for at least 8 hours.    Comment 1 Notify RN    Comment 2 Document in Chart   Glucose, capillary     Status: None   Collection Time: 04/14/20  4:18 PM  Result Value Ref Range   Glucose-Capillary 84 70 - 99 mg/dL    Comment: Glucose reference range applies only to samples taken after fasting for at least 8 hours.   Comment 1 Notify RN    Comment 2 Document in Chart      ROS:  Review of systems not obtained due to patient factors.  Physical Exam: Vitals:   04/14/20 1146 04/14/20 1604  BP: (!) 128/59 (!) 119/59  Pulse: 87 78  Resp: 20 17  Temp: 98.4 F (36.9 C) 98.5 F (36.9 C)  SpO2: 94% 96%     General: lying in bed-  Opened eyes to much stimuli- no commands HEENT: PERRLA, some facial edema Neck: no JVD Heart: RRR Lungs: poor effort, CBS Abdomen: obese Extremities: chronic woody edema-  AKA on left Skin: warm and dry Neuro: as above  Assessment/Plan: 75 year old BF with many medical issues including advanced CKD who had decided in conversations with her primary nephrologist that she did not desire dialysis.  A failure to thrive of late with element of dementia-  Now with slight worsening of her renal parameters-  Family has change mind and now desires dialysis  1.Renal- difficult situation.  Advanced CKD at baseline-  crt in the mid to high 4's previously not uremic and had made decision to not pursue dialysis.  First of all, there are no acute indications to do dialysis right now-  I dont think a BUN of 63 is causing her MS issues- I think is due to her current illness and dementia. Before we make a final decision I want to make sure that the family understands what the patient had told her nephrologist in the past and want to make sure we are considering that as well as the fact that initiating dialysis is not going to fix all of her issues.  The other factor is that dialysis is not a benign procedure -  With her heart failure and pulmonary HTN she may not tolerate it well- they need to understand  that.  The final issue is that if we do initiate dialysis and she stays unresponsive it would not be appropriate to continue and we will have a very difficult time with disposition out of the hospital.  I tried to plant to seed with Corinna possibly a "trial" of dialysis and then if her clinical status continues to be poor or worsens it may be appropriate to stop- saying that we did try everything possible.   She possibly seemed open to that but wanted me to talk to [place].  She said multiple times that there are many medical professionals in the family and she could not believe that I would try to talk her out of what they want to do for their Mom- she is very angry which is not helping.  Again there are no urgent indications for dialysis today so have some more time for more conversations 2. Hypertension/volume  - hemodynamically stable - possibly some volume overloaded but difficult to tell in the setting of PNA-  I do not recommend fluids or diuretics 3. Anemia  - hgb 7.4--- will check iron stores and consider repletion if low-  hgb not low enough for transfusion, will add ESA    Louis Meckel 04/14/2020, 4:32 PM

## 2020-04-14 NOTE — Progress Notes (Signed)
SLP Cancellation Note  Patient Details Name: Stephanie Griffith MRN: [id number] DOB: Dec 15, 1945   Cancelled treatment:       Reason Eval/Treat Not Completed: Patient at procedure or test/unavailable   Elvina Sidle, M.S., CCC-SLP 04/14/2020, 2:00 PM

## 2020-04-14 NOTE — Consult Note (Signed)
Consultation Note Date: 04/14/2020   Patient Name: Stephanie Griffith  DOB: Jun 30, 1945  MRN: [id number]  Age / Sex: 75 y.o., female  PCP: Stephanie Carol, MD Referring Physician: Debbe Odea, MD  Reason for Consultation: Establishing goals of care  HPI/Patient Profile: 75 y.o. female  with past medical history of COPD, CHF, ESRD, recent admission for COVID 19 infection, vascular dementia admitted on 04/16/2020 with mental status changes. Found to have elevated ammonia, worsening kidney function, pulmonary congestion, a-fib with RVR. Now with likely aspiration pneumonia. Mental status has waxed and waned over admission. CT scan of head did not show acute changes- but did show chronic ischemic disease and remote R cerebellar infarct.  Feeding tube has been placed. Palliative medicine consulted for goals of care.   Clinical Assessment and Goals of Care: Upon my arrival to the room several of patient's daughters were at bedside and without discussion stated they wished for full code, start dialysis immediately, and to transfer [facility] to [facility]. I introduced myself and the role of palliative medicine. Palliative medicine is specialized medical care for people living with serious illness. It focuses on providing relief from the symptoms and stress of a serious illness. The goal is to improve quality of life for both the patient and the family. Evaluated patient- she responded to my greeting by saying- "hey, how are you?" however, her eyes remained closed, her head was tilted to the right. She could not follow commands (squeeze my hand), or answer any orientation questions. She was able to answer her daughter who asked what her name was.   While waiting for one more daughter to arrive Dr. Wynelle Griffith arrived- let family know she would put in consult for nephrology.  I met in conference with patient's daughters- Stephanie Griffith,  Stephanie Griffith, and an OB/GYN physician that works with Stephanie Griffith. All daughters work in Corporate treasurer- two are travel nurses who work in [place]. Stephanie Griffith is in Engineer, civil (consulting) and is a Designer, multimedia for our own Passenger transport manager, and Stephanie Griffith works at an Ob/Gyn office- I was not able to establish her role there. They have a great amount of medical knowledge among them. All were very frustrated with patient's care, communication, and visitation policies.  Family requested code status changed in computer system before discussion and that was completed. I attempted to perform life review- patient's two daughters who are local help care for her at home. She enjoys spending time with her Grandchildren.  Attempted to discuss patient's hospital course and comorbidities, anticipated care decisions and goals of care. Patient's daughter, Stephanie Griffith, was not receptive to discussion and left room before close of discussion.  In our discussion we were able to elicit that patient would not want prolonged life support- but would be open to trials of dialysis, artificial feeding and full code. If patient coded and required ventilatory support they would then come together to make further plans.  We discussed that it was reasonable to attempt trial of dialysis with goal of restoring mental status and previous function. However, there  are concerns that patient would not be long term dialysis candidate if her mental status did not improve. At close of discussion- family agreeable to continued followup by Palliative medicine.  After other family had left- Stephanie Griffith shared that patient had very difficult time coming off of ventilator last year after a hernia surgery, and she is worried that if patient required full anesthesia such as would be required for permanent access- then she wouldn't tolerate the surgery. I agreed with her this is a valid concern. Stephanie Griffith shared she was aware of patient's tenuous state, and does not want her Mom to suffer, but she just wants to  ensure that all attempts had been made to improve her mother's state.    Primary Decision Maker NEXT OF KIN- patient's daughters- Stephanie Griffith is the primary contact    SUMMARY OF RECOMMENDATIONS -Full code -Trial of dialysis with goal of restoring mental status and some level of function -Trial of tube feeding via NG tube -Family wishes for full scope care and all medical attempts to be made to restore patient's mental status -Several daughters work in [place] and all family has medical background- they are open to LTAC placement eventually if necessary -PMT will followup with family on Monday    Code Status/Advance Care Planning:  Full code  Palliative Prophylaxis:   Aspiration  Additional Recommendations (Limitations, Scope, Preferences):  Full Scope Treatment    Prognosis:    Unable to determine  Discharge Planning: To Be Determined  Primary Diagnoses: Present on Admission: . Acute encephalopathy . Atrial fibrillation with RVR ([place]) . PAD (peripheral artery disease) ([place]) . Normocytic anemia . Metabolic acidosis . Leukocytosis . Dyslipidemia . Encephalopathy due to COVID-19 virus . Chronic renal insufficiency, stage IV (severe) ([place]) . Chronic diastolic CHF (congestive heart failure) ([place]) . Acute metabolic encephalopathy   I have reviewed the medical record, interviewed the patient and family, and examined the patient. The following aspects are pertinent.  Past Medical History:  Diagnosis Date  . Anemia of chronic disease   . Chronic diastolic CHF (congestive heart failure) ([place])   . Chronic renal insufficiency, stage IV (severe) ([place])   . COPD (chronic obstructive pulmonary disease) ([place])   . Hyperglycemia   . Hyperlipidemia   . Hypertension   . PAD (peripheral artery disease) ([place])    a. s/p femoral to below knee popliteal bypass then L AKA.  Marland Kitchen PAF (paroxysmal atrial fibrillation) ([place])   . PAT (paroxysmal atrial tachycardia) ([place])   . Pulmonary  hypertension ([place])   . Umbilical hernia    Social History   Socioeconomic History  . Marital status: Married    Spouse name: Not on file  . Number of children: Not on file  . Years of education: Not on file  . Highest education level: Not on file  Occupational History  . Not on file  Tobacco Use  . Smoking status: Former Smoker    Packs/day: 2.00    Years: 50.00    Pack years: 100.00    Quit date: 06/01/2011    Years since quitting: 8.8  . Smokeless tobacco: Never Used  Vaping Use  . Vaping Use: Never used  Substance and Sexual Activity  . Alcohol use: No  . Drug use: No  . Sexual activity: Not Currently  Other Topics Concern  . Not on file  Social History Narrative  . Not on file   Social Determinants of Health   Financial Resource Strain: Not on file  Food Insecurity: Not  on file  Transportation Needs: Not on file  Physical Activity: Not on file  Stress: Not on file  Social Connections: Not on file   Scheduled Meds: . Chlorhexidine Gluconate Cloth  6 each Topical Daily  . [START ON 04/21/2020] darbepoetin (ARANESP) injection - NON-DIALYSIS  200 mcg Subcutaneous Q Thu-1800  . feeding supplement (PROSource TF)  45 mL Per Tube Daily  . free water  150 mL Per Tube Q6H  . heparin  5,000 Units Subcutaneous Q8H  . metoprolol tartrate  5 mg Intravenous Q6H  . sodium chloride flush  10-40 mL Intracatheter Q12H   Continuous Infusions: . ampicillin-sulbactam (UNASYN) IV 1.5 g (04/14/20 1331)  . diltiazem (CARDIZEM) infusion Stopped (04/13/20 1444)  . feeding supplement (OSMOLITE 1.2 CAL) Stopped (04/12/20 1546)   PRN Meds:.acetaminophen **OR** acetaminophen, albuterol, bisacodyl, haloperidol lactate, metoprolol tartrate, sodium chloride flush Medications Prior to Admission:  Prior to Admission medications   Medication Sig Start Date End Date Taking? Authorizing Provider  amLODipine (NORVASC) 10 MG tablet Take 10 mg by mouth daily.  07/08/14  Yes [provider]   aspirin 81 MG EC tablet Take 81 mg by mouth daily.   Yes [provider]  atorvastatin (LIPITOR) 40 MG tablet Take 1 tablet (40 mg total) by mouth daily at 6 PM. Patient taking differently: Take 40 mg by mouth daily. 02/18/19 03/02/20 Yes Ghimire, Dante Gang, MD  calcitRIOL (ROCALTROL) 0.5 MCG capsule Take 0.5 mcg by mouth daily. 02/22/20  Yes [provider]  furosemide (LASIX) 40 MG tablet Take 1 tablet (40 mg total) by mouth daily. 02/19/19  Yes Barb Merino, MD  gabapentin (NEURONTIN) 100 MG capsule Take 100 mg by mouth 2 (two) times daily. 06/23/14  Yes [provider]  metoprolol tartrate (LOPRESSOR) 100 MG tablet Take 1 tablet (100 mg total) by mouth 2 (two) times daily. 04/04/20  Yes Thurnell Lose, MD  Multiple Vitamin (MULTIVITAMIN WITH MINERALS) TABS tablet Take 1 tablet by mouth daily.   Yes [provider]  sodium bicarbonate 650 MG tablet Take 650 mg by mouth in the morning, at noon, and at bedtime. 12/29/19  Yes [provider]  albuterol (VENTOLIN HFA) 108 (90 Base) MCG/ACT inhaler Inhale 2 puffs into the lungs every 6 (six) hours as needed for wheezing or shortness of breath. Patient not taking: Reported on 04/02/2020 04/04/20   Thurnell Lose, MD   Allergies  Allergen Reactions  . Morphine And Related Other (See Comments)    AMS   Review of Systems  Physical Exam  Vital Signs: BP (!) 119/59 (BP Location: Left Arm)   Pulse 78   Temp 98.5 F (36.9 C) (Oral)   Resp 17   Ht 5' 8"  (1.727 m)   Wt 89.8 kg   SpO2 96%   BMI 30.10 kg/m  Pain Scale: PAINAD   Pain Score: 0-No pain   SpO2: SpO2: 96 % O2 Device:SpO2: 96 % O2 Flow Rate: .O2 Flow Rate (L/min): 4 L/min  IO: Intake/output summary: No intake or output data in the 24 hours ending 04/14/20 1815  LBM: Last BM Date: 04/12/20 Baseline Weight: Weight: 89.8 kg Most recent weight: Weight: 89.8 kg     Palliative Assessment/Data: PPS: 10%     Thank you for this consult.  Palliative medicine will continue to follow and assist as needed.   Time In: 1400 Time Out: 1539 Time Total: 99 minutes Greater than 50%  of this time was spent counseling and coordinating care related to  the above assessment and plan.  Signed by: Mariana Kaufman, AGNP-C Palliative Medicine    Please contact Palliative Medicine Team phone at [phone] for questions and concerns.  For individual provider: See Amion       .

## 2020-04-14 NOTE — Progress Notes (Signed)
Physical Therapy Treatment Patient Details Name: Stephanie Griffith MRN: [id number] DOB: March 15, 1946 Today's Date: 04/14/2020    History of Present Illness Pt is a 75 y.o. female with a medical hx significant for pulmonary HTN, L AKA, PAT, PAF, PAD, HTN, hyperglycemia, COPD, chronic renal insufficiency stage IV, CHF, and anemia who presents with increased confusion and decreased Po instake since her ecent admission for COVID pneumonia and encephalopathy (D/C'd 04/04/20). Suspect underlying dementia with sundowning. Pt admitted for acute encephalopathy due to COVID-19. EEG showed: generalized periodic discharges with triphasic morphology at 2hz  which is on the ictal-interictal continuum and can sometimes be seen with toxic-metabolic etiology, moderate to severe diffuse encephalopathy with nonspecific etiology, and no seizures were seen. 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    PT Comments    Patient met lying supine in bed. Patient maintains eyes closed throughout treatment session with brief periods of eyes opening upon sitting EOB. Blank gaze without awareness of environment noted. Patient unable to appropriately respond to questions or commands. Total A grossly for all aspects of bed mobility this date without initiation or attempt to assist. R lower extremity PROM performed to facilitate soft tissue elongation and decrease risk of contractures. Upon return to supine, patient positioned in semi-sidelying position on L to decrease cervical neck tightness. R heel floating at end of session to prevent pressure ulcers. PT will continue to follow acutely. Current recommendations remain appropriate, but if pt continues to have decreased arousal limiting progress then may need to decrease frequency.   Follow Up Recommendations  SNF;Supervision/Assistance - 24 hour     Equipment Recommendations  Hospital bed    Recommendations for Other Services        Precautions / Restrictions Precautions Precautions: Fall Precaution Comments: L AKA, prosthesis no longer fits has not worn for a long time. Restrictions Weight Bearing Restrictions: Yes LLE Weight Bearing: Non weight bearing    Mobility  Bed Mobility Overal bed mobility: Needs Assistance Bed Mobility: Supine to Sit;Sit to Supine     Supine to sit: Total assist;+2 for physical assistance;+2 for safety/equipment Sit to supine: Total assist;+2 for physical assistance;+2 for safety/equipment   General bed mobility comments: Total assist +2 for all aspects. Patient does not initiate or follow commands.  Transfers                 General transfer comment: Deferred due to safety.  Ambulation/Gait             General Gait Details: Unable-WC bound   Marine scientist [place] (Stroke Patients Only)       Balance Overall balance assessment: Needs assistance Sitting-balance support: Bilateral upper extremity supported (R foot supported) Sitting balance-Leahy Scale: Poor Sitting balance - Comments: R lateral lean. Patient unable to correct. Max A to Total A to maintain static sitting balance. Patient does not initiate. Attempted sitting leaning on L elbow, but no initiation from pt. Postural control: Right lateral lean     Standing balance comment: deferred due to safety concerns.                            Cognition Arousal/Alertness: Lethargic Behavior During Therapy: Flat affect Overall Cognitive Status: Impaired/Different from baseline  General Comments: Patient with eyes closed majority of treatment session. Responded to name being called with ~15% accuracy. Patient obtunded. No initiation or functional participation with therapy efforts this date.      Exercises Other Exercises Other Exercises: PROM to neck into L lateral flexion, 3x ~20 sec Other  Exercises: PROM bilat cervical rotation ~5 sec x5 reps each Other Exercises: PROM R knee extension 3x ~30 sec Other Exercises: PROM R ankle dorsiflexion and plantarflexion 3x ~30 sec each    General Comments General comments (skin integrity, edema, etc.): VSS on 4L O2 via [place]      Pertinent Vitals/Pain Pain Assessment: Faces Faces Pain Scale: No hurt Pain Intervention(s): Monitored during session    Home Living                      Prior Function            PT Goals (current goals can now be found in the care plan section) Acute Rehab PT Goals Patient Stated Goal: Unable PT Goal Formulation: Patient unable to participate in goal setting Time For Goal Achievement: May 15, 2020 Potential to Achieve Goals: Fair Progress towards PT goals: Not progressing toward goals - comment (limited by level of arousal)    Frequency    Min 3X/week      PT Plan Current plan remains appropriate    Co-evaluation PT/OT/SLP Co-Evaluation/Treatment: Yes Reason for Co-Treatment: Complexity of the patient's impairments (multi-system involvement);Necessary to address cognition/behavior during functional activity;For patient/therapist safety;To address functional/ADL transfers PT goals addressed during session: Mobility/safety with mobility;Balance OT goals addressed during session: ADL's and self-care;Strengthening/ROM      AM-PAC PT "6 Clicks" Mobility   Outcome Measure  Help needed turning from your back to your side while in a flat bed without using bedrails?: Total Help needed moving from lying on your back to sitting on the side of a flat bed without using bedrails?: Total Help needed moving to and from a bed to a chair (including a wheelchair)?: Total Help needed standing up from a chair using your arms (e.g., wheelchair or bedside chair)?: Total Help needed to walk in hospital room?: Total Help needed climbing 3-5 steps with a railing? : Total 6 Click Score: 6    End of  Session   Activity Tolerance: Patient limited by lethargy Patient left: in bed;with call bell/phone within reach;with bed alarm set   PT Visit Diagnosis: Muscle weakness (generalized) (M62.81)     Time: 2119-4174 PT Time Calculation (min) (ACUTE ONLY): 25 min  Charges:  $Therapeutic Activity: 8-22 mins                     Moishe Spice, PT, DPT Acute Rehabilitation Services  Pager: [phone] Office: [place] 04/14/2020, 1:03 PM

## 2020-04-14 NOTE — Progress Notes (Signed)
Occupational Therapy Treatment Patient Details Name: Stephanie Griffith MRN: [id number] DOB: 09/15/1945 Today's Date: 04/14/2020    History of present illness Pt is a 75 y.o. female with a medical hx significant for pulmonary HTN, L AKA, PAT, PAF, PAD, HTN, hyperglycemia, COPD, chronic renal insufficiency stage IV, CHF, and anemia who presents with increased confusion and decreased Po instake since her ecent admission for COVID pneumonia and encephalopathy (D/C'd 04/04/20). Suspect underlying dementia with sundowning. Pt admitted for acute encephalopathy due to COVID-19. EEG showed: generalized periodic discharges with triphasic morphology at 2hz  which is on the ictal-interictal continuum and can sometimes be seen with toxic-metabolic etiology, moderate to severe diffuse encephalopathy with nonspecific etiology, and no seizures were seen. 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   OT comments  Patient met lying supine in bed. Patient maintains eyes closed throughout treatment session with brief periods of eyes opening upon sitting EOB. Blank gaze without awareness of environment noted. Patient unable to appropriately respond to questions or commands. Total A grossly for all aspects of bed mobility this date without initiation or attempt to assist. Cervical and UE PROM performed in all planes of movement to facilitate soft tissue elongation and decrease risk of contractures. Upon return to supine, patient positioned in semi-sidelying position on L do decreased cervical neck tightness. Patient seems to have declined since last OT treatment session. OT will continue to follow acutely. May be appropriate to decrease frequency given patient decline in function and decreased participation with therapy efforts 2/2 lethargy.    Follow Up Recommendations  SNF    Equipment Recommendations  None recommended by OT    Recommendations for Other Services       Precautions / Restrictions Precautions Precautions: Fall Precaution Comments: L AKA, prosthesis no longer fits has not worn for a long time. Restrictions Weight Bearing Restrictions: No       Mobility Bed Mobility Overal bed mobility: Needs Assistance Bed Mobility: Supine to Sit;Sit to Supine     Supine to sit: Total assist;+2 for physical assistance;+2 for safety/equipment Sit to supine: Total assist;+2 for physical assistance;+2 for safety/equipment   General bed mobility comments: Total assist +2 for all aspects. Patient does not initiate or follow commands.  Transfers                 General transfer comment: Deferred due to safety.    Balance Overall balance assessment: Needs assistance Sitting-balance support: Bilateral upper extremity supported (R foot supported) Sitting balance-Leahy Scale: Poor Sitting balance - Comments: R lateral lean. Patient unable to correct. Max A to Total A to maintain static sitting balance. Patient does not initiate. Postural control: Right lateral lean                                 ADL either performed or assessed with clinical judgement   ADL Overall ADL's : Needs assistance/impaired     Grooming: Total assistance;Bed level                                       Vision       Perception     Praxis      Cognition Arousal/Alertness: Lethargic Behavior During Therapy: Flat affect Overall Cognitive Status: Impaired/Different from baseline  General Comments: Patient with eyes closed majority of treatment session. Responded to name being called with ~15% accuracy. Patient obtunded. No initiation or functional participation with therapy efforts this date.        Exercises     Shoulder Instructions       General Comments VSS on 4L O2 via [place]    Pertinent Vitals/ Pain       Pain Assessment: Faces Faces Pain Scale: No hurt Pain  Intervention(s): Monitored during session  Home Living                                          Prior Functioning/Environment              Frequency  Min 2X/week        Progress Toward Goals  OT Goals(current goals can now be found in the care plan section)  Progress towards OT goals: Not progressing toward goals - comment (No initiation to participate with therapy efforts this date. Patient obtunded.)  Acute Rehab OT Goals Patient Stated Goal: Unable OT Goal Formulation: Patient unable to participate in goal setting Time For Goal Achievement: 05/09/20 Potential to Achieve Goals: Poor ADL Goals Pt Will Perform Grooming: with mod assist;sitting Pt Will Perform Lower Body Dressing: with min assist;sitting/lateral leans Pt Will Transfer to Toilet: with max assist;with +2 assist;with transfer board;bedside commode;squat pivot transfer Pt/caregiver will Perform Home Exercise Program: Increased strength;Both right and left upper extremity;With theraband;With written HEP provided Additional ADL Goal #1: Pt will sustain attention to simple grooming tasks x 3 mins with mod cues Additional ADL Goal #2: Pt will wash face with mod A  Plan Discharge plan remains appropriate;Frequency remains appropriate (May downgrade frequency to 1x weekly pending patient progress)    Co-evaluation      Reason for Co-Treatment: Complexity of the patient's impairments (multi-system involvement);For patient/therapist safety;To address functional/ADL transfers   OT goals addressed during session: ADL's and self-care;Strengthening/ROM      AM-PAC OT "6 Clicks" Daily Activity     Outcome Measure   Help from another person eating meals?: Total Help from another person taking care of personal grooming?: Total Help from another person toileting, which includes using toliet, bedpan, or urinal?: Total Help from another person bathing (including washing, rinsing, drying)?: Total Help  from another person to put on and taking off regular upper body clothing?: Total Help from another person to put on and taking off regular lower body clothing?: Total 6 Click Score: 6    End of Session    OT Visit Diagnosis: Cognitive communication deficit (R41.841)   Activity Tolerance Patient limited by lethargy   Patient Left in bed;with call bell/phone within reach;with bed alarm set;with restraints reapplied   Nurse Communication          Time: 4650-3546 OT Time Calculation (min): 25 min  Charges: OT General Charges $OT Visit: 1 Visit OT Treatments $Self Care/Home Management : 8-22 mins  Breven Guidroz H. OTR/L Supplemental OT, Department of rehab services [phone]   Elizabeth Haff R H. 04/14/2020, 11:56 AM

## 2020-04-14 NOTE — Progress Notes (Addendum)
PROGRESS NOTE    Stephanie Griffith   NAT:[id number]  DOB: October 03, 1945  DOA: 04/20/2020 PCP: [name], MD   Brief Narrative:  Stephanie Griffith 75 year old African-American female with a past medical history of chronic diastolic CHF, chronic kidney disease stage V, history of COPD, hyperlipidemia, essential hypertension, paroxysmal atrial fibrillation, pulmonary hypertension who was recently hospitalized for a UTI with altered mentation followed by pneumonia due to COVID-19 and a further decline in mental status. She was discharged on January 3.  She was discharged home per family request.  Recommendation was for her to go to SNF. Presented back to the hospital with worsening confusion.   Subjective: Remains very lethargic. Family wants to revert code status to Full code and is requesting dialysis.    Assessment & Plan:   Principal Problem:   Acute metabolic encephalopathy- probable underlying dementia exacerbated by UTI and COVID 19 infectio - suspecting underlying vascular dementia - despite giving Lactulose and ammonia improving to 46, she is more somnolent - d/c Lactulose due to persistent hypokalemia and inability to improve her mental state - imaging of brain unrevealing - EEG showing triphasic morphology was noted. Per report it is on the ictal/interictal continuum. Started on Keppra but mentation worsened and thus it was stopped.  - family asking for a temporary feeding tube which was placed however, since her respiratory status has worsened I have asked the family to hold off on starting tube feeds to prevent another aspiration - palliative care consulted- I strongly feel she needs to be transitioned to comfort care but family feels that all of this is reversible  Addendum:Called by RN to speak with daughters who are requesting more IV Lasix, there are 5 of her daughters in the patient's room and I have explained that her creatinine continues to rise. The are now requesting a temporary  dialysis cathter to be placed and for the patient to be dialyzed. They further state that their mother was just awake and alert and talking to them and told them that she wanted them to do everything to save her. I did try to get the patient to wake up while I was in the room but she is clearly lethargic on my exam. The daughters insist she was talking to them but then state "she goes in an out". They further state that they may change her code status to a full code. They are about to meet with palliative care.   I have contacted Dr Clover Mealy who is on call for nephrology and explained to her the request for dialysis.    Active Problems: Aspiration pneumonia with acute respiratory distress - started on Unasyn on 1/9 - blood cultures for 1/6 are negative - having low grade fevers (100) and intermittent tachypnea- - hypoxic and on 3-4 L O2  A-fib with RVR - on Cardizem infusion and receiving IV Metoprolol- HR better controlled today and mostly < 100 - cont IV meds for now  Recent COVID 19 infection- tested + on 03/30/20 - treated with Remdesivir and steroids  Nutrition - inability to swallow due to lethargy - a cor trac was discussed between Dr Maryland Pink and the family and placed on 1/11- due to acute aspiration I have held off on starting tube feeds but if respiratory status stabilizes, can start to feed  Severe Hypokalemia, hypomagnesemia - K 2.7-Mg 1.6 - cont to replace via IV as needed-   CKD 5 - she previously declined hemodialysis - started on a bicarb infusion for  metabolic acidosis however urine output had dropped considerable a few days ago and despite receiving IVF, she was oliguric- she appeared to be fluid overloaded yesterday and Lasix given    Cr continues to rise and I feel that we are at a point where neither fluids nor lasix will help her and she should be transitioned to comfort care  Normocytic anemia - likely anemia of chronic disease    Poor venous access - PICC  line placed on 1/8    Time spent in minutes: 60 DVT prophylaxis: heparin injection 5,000 Units Start: 04/08/20 0730  Code Status: DNR Family Communication:  Disposition Plan:  Status is: Inpatient  Remains inpatient appropriate because:IV treatments appropriate due to intensity of illness or inability to take PO   Dispo: The patient is from: Home              Anticipated d/c is to: TBD              Anticipated d/c date is: > 3 days              Patient currently is not medically stable to d/c.  Consultants:   Palliative care Procedures:   Cor trac Antimicrobials:  Anti-infectives (From admission, onward)   Start     Dose/Rate Route Frequency Ordered Stop   04/10/20 1330  ampicillin-sulbactam (UNASYN) 1.5 g in sodium chloride 0.9 % 100 mL IVPB        1.5 g 200 mL/hr over 30 Minutes Intravenous Every 12 hours 04/10/20 1232 04/15/20 1329       Objective: Vitals:   04/14/20 0000 04/14/20 0403 04/14/20 0753 04/14/20 1146  BP: 133/61 137/63 (!) 135/58 (!) 128/59  Pulse: 98 98 92 87  Resp: 20 20 17 20   Temp: 100 F (37.8 C) 100 F (37.8 C) 99 F (37.2 C) 98.4 F (36.9 C)  TempSrc: Oral Oral Oral Oral  SpO2: 91% 93% 94% 94%  Weight:      Height:       No intake or output data in the 24 hours ending 04/14/20 1337 Filed Weights   04/18/2020 1632  Weight: 89.8 kg    Examination: General exam: lethargic HEENT: PERRL, no sclera icterus or thrush- cor trach present Respiratory system: Clear to auscultation. Resp rate in 20s. Pulse ox 95% on 3 L O2 Cardiovascular system: S1 & S2 heard,  No murmurs  Gastrointestinal system: Abdomen soft, non-tender, nondistended. Normal bowel sounds   Central nervous system: lethargic, flaccid extermities Extremities: No cyanosis, clubbing or edema Skin: No rashes or ulcers Psychiatry:  cannot assess   Data Reviewed: I have personally reviewed following labs and imaging studies  CBC: Recent Labs  Lab 04/08/20 0725  04/09/20 0418 04/10/20 0254 04/11/20 0500 04/12/20 0512 04/13/20 0452  WBC 15.0* 9.7 14.6* 15.2* 11.2* 13.0*  NEUTROABS 10.8*  --   --   --   --   --   HGB 8.7* 9.1* 7.5* 7.9* 8.5* 7.4*  HCT 28.3* 29.9* 23.0* 25.4* 27.3* 23.1*  MCV 94.0 94.0 90.6 92.7 92.2 91.3  PLT 187 158 154 160 141* 449   Basic Metabolic Panel: Recent Labs  Lab 04/08/20 0030 04/08/20 0549 04/08/20 0725 04/10/20 0254 04/10/20 1732 04/11/20 0500 04/12/20 0512 04/13/20 0452 04/13/20 0736 04/14/20 0358  NA  --   --    < > 142  --  146* 142 139  --  142  K  --   --    < > 2.6* 3.3*  3.0* 3.3* 2.7*  --  3.8  CL  --   --    < > 108  --  111 105 99  --  103  CO2  --   --    < > 22  --  21* 23 27  --  24  GLUCOSE  --   --    < > 276*  --  118* 112* 290*  --  82  BUN  --   --    < > 61*  --  65* 63* 55*  --  63*  CREATININE  --   --    < > 4.52*  --  5.13* 5.31* 5.12*  --  5.78*  CALCIUM  --   --    < > 7.3*  --  7.9* 7.7* 7.1*  --  8.0*  MG 2.0 2.1  --   --   --   --   --   --  1.6*  --   PHOS  --  4.2  --   --   --   --   --   --   --   --    < > = values in this interval not displayed.   GFR: Estimated Creatinine Clearance: 10 mL/min (A) (by C-G formula based on SCr of 5.78 mg/dL (H)). Liver Function Tests: Recent Labs  Lab 04/08/20 0725  AST 42*  ALT 27  ALKPHOS 119  BILITOT 1.0  PROT 7.3  ALBUMIN 2.8*   No results for input(s): LIPASE, AMYLASE in the last 168 hours. Recent Labs  Lab 04/08/20 0030 04/10/20 0254 04/11/20 0500 04/13/20 0454  AMMONIA 58* 74* 58* 46*   Coagulation Profile: Recent Labs  Lab 04/08/20 0029  INR 1.1   Cardiac Enzymes: Recent Labs  Lab 04/08/20 0030  CKTOTAL 318*   BNP (last 3 results) No results for input(s): PROBNP in the last 8760 hours. HbA1C: No results for input(s): HGBA1C in the last 72 hours. CBG: Recent Labs  Lab 04/13/20 2005 04/14/20 0031 04/14/20 0415 04/14/20 0801 04/14/20 1150  GLUCAP 84 81 75 84 77   Lipid Profile: No results  for input(s): CHOL, HDL, LDLCALC, TRIG, CHOLHDL, LDLDIRECT in the last 72 hours. Thyroid Function Tests: No results for input(s): TSH, T4TOTAL, FREET4, T3FREE, THYROIDAB in the last 72 hours. Anemia Panel: No results for input(s): VITAMINB12, FOLATE, FERRITIN, TIBC, IRON, RETICCTPCT in the last 72 hours. Urine analysis:    Component Value Date/Time   COLORURINE YELLOW 04/17/2020 1318   APPEARANCEUR CLEAR 04/13/2020 1318   LABSPEC 1.015 04/02/2020 1318   PHURINE 5.0 04/06/2020 1318   GLUCOSEU NEGATIVE 04/04/2020 1318   HGBUR NEGATIVE 04/02/2020 1318   [place] 04/11/2020 1318   [place] 05/01/2020 1318   PROTEINUR 100 (A) 04/20/2020 1318   UROBILINOGEN 0.2 06/18/2011 1751   NITRITE NEGATIVE 04/13/2020 1318   LEUKOCYTESUR NEGATIVE 04/03/2020 1318   Sepsis Labs: @LABRCNTIP (procalcitonin:4,lacticidven:4) ) Recent Results (from the past 240 hour(s))  Culture, blood (single)     Status: None   Collection Time: 04/08/2020  1:27 PM   Specimen: BLOOD LEFT FOREARM  Result Value Ref Range Status   Specimen Description BLOOD LEFT FOREARM  Final   Special Requests   Final    BOTTLES DRAWN AEROBIC AND ANAEROBIC Blood Culture results may not be optimal due to an inadequate volume of blood received in culture bottles   Culture   Final    NO GROWTH 5 DAYS Performed at [facility], [place] [address], [place], [place] [address]    Report Status 04/12/2020 FINAL  Final  Urine culture     Status: None   Collection Time: 04/28/2020  3:02 PM   Specimen: Urine, Random  Result Value Ref Range Status   Specimen Description URINE, RANDOM  Final   Special Requests NONE  Final   Culture   Final    NO GROWTH Performed at [facility], [place] [address], [place], [place] [address]    Report Status 04/08/2020 FINAL  Final         Radiology Studies: No results found.    Scheduled Meds:  Chlorhexidine Gluconate Cloth  6 each Topical Daily   feeding supplement  (PROSource TF)  45 mL Per Tube Daily   free water  150 mL Per Tube Q6H   heparin  5,000 Units Subcutaneous Q8H   metoprolol tartrate  5 mg Intravenous Q6H   sodium chloride flush  10-40 mL Intracatheter Q12H   Continuous Infusions:  ampicillin-sulbactam (UNASYN) IV 1.5 g (04/14/20 1331)   diltiazem (CARDIZEM) infusion Stopped (04/13/20 1444)   feeding supplement (OSMOLITE 1.2 CAL) Stopped (04/12/20 1546)     LOS: 6 days      Debbe Odea, MD [facility] Pager: [email or web address] 04/14/2020, 1:37 PM

## 2020-04-15 DIAGNOSIS — R4182 Altered mental status, unspecified: Secondary | ICD-10-CM | POA: Diagnosis not present

## 2020-04-15 DIAGNOSIS — N184 Chronic kidney disease, stage 4 (severe): Secondary | ICD-10-CM | POA: Diagnosis not present

## 2020-04-15 DIAGNOSIS — G9340 Encephalopathy, unspecified: Secondary | ICD-10-CM | POA: Diagnosis not present

## 2020-04-15 DIAGNOSIS — I4891 Unspecified atrial fibrillation: Secondary | ICD-10-CM | POA: Diagnosis not present

## 2020-04-15 LAB — CBC
HCT: 24.5 % — ABNORMAL LOW (ref 36.0–46.0)
Hemoglobin: 7.9 g/dL — ABNORMAL LOW (ref 12.0–15.0)
MCH: 29.7 pg (ref 26.0–34.0)
MCHC: 32.2 g/dL (ref 30.0–36.0)
MCV: 92.1 fL (ref 80.0–100.0)
Platelets: 154 10*3/uL (ref 150–400)
RBC: 2.66 MIL/uL — ABNORMAL LOW (ref 3.87–5.11)
RDW: 17.9 % — ABNORMAL HIGH (ref 11.5–15.5)
WBC: 14.4 10*3/uL — ABNORMAL HIGH (ref 4.0–10.5)
nRBC: 0.1 % (ref 0.0–0.2)

## 2020-04-15 LAB — FERRITIN: Ferritin: 120 ng/mL (ref 11–307)

## 2020-04-15 LAB — BASIC METABOLIC PANEL
Anion gap: 14 (ref 5–15)
BUN: 68 mg/dL — ABNORMAL HIGH (ref 8–23)
CO2: 22 mmol/L (ref 22–32)
Calcium: 7.7 mg/dL — ABNORMAL LOW (ref 8.9–10.3)
Chloride: 105 mmol/L (ref 98–111)
Creatinine, Ser: 5.85 mg/dL — ABNORMAL HIGH (ref 0.44–1.00)
GFR, Estimated: 7 mL/min — ABNORMAL LOW (ref >60)
Glucose, Bld: 134 mg/dL — ABNORMAL HIGH (ref 70–99)
Potassium: 3.4 mmol/L — ABNORMAL LOW (ref 3.5–5.1)
Sodium: 141 mmol/L (ref 135–145)

## 2020-04-15 LAB — IRON AND TIBC
Iron: 18 ug/dL — ABNORMAL LOW (ref 28–170)
Saturation Ratios: 14 % (ref 10.4–31.8)
TIBC: 132 ug/dL — ABNORMAL LOW (ref 250–450)
UIBC: 114 ug/dL

## 2020-04-15 LAB — GLUCOSE, CAPILLARY
Glucose-Capillary: 114 mg/dL — ABNORMAL HIGH (ref 70–99)
Glucose-Capillary: 141 mg/dL — ABNORMAL HIGH (ref 70–99)
Glucose-Capillary: 95 mg/dL (ref 70–99)
Glucose-Capillary: 124 mg/dL — ABNORMAL HIGH (ref 70–99)
Glucose-Capillary: 118 mg/dL — ABNORMAL HIGH (ref 70–99)

## 2020-04-15 MED ORDER — POTASSIUM CHLORIDE 10 MEQ/100ML IV SOLN
10.0000 meq | INTRAVENOUS | Status: AC
  Administered 2020-04-15 (×2): 10 meq via INTRAVENOUS
  Filled 2020-04-15 (×2): qty 100

## 2020-04-15 MED ORDER — SODIUM CHLORIDE 0.9 % IV SOLN
250.0000 mg | INTRAVENOUS | Status: DC
  Administered 2020-04-15 – 2020-04-22 (×2): 250 mg via INTRAVENOUS
  Filled 2020-04-15 (×2): qty 20

## 2020-04-15 NOTE — Plan of Care (Signed)
See note from MD. RN called about conversion to A. Flutter. Tele confirmed it happened in prior shift and strip added from Tele. Problem: Education: Goal: Knowledge of General Education information will improve Description: Including pain rating scale, medication(s)/side effects and non-pharmacologic comfort measures Outcome: Progressing   Problem: Health Behavior/Discharge Planning: Goal: Ability to manage health-related needs will improve Outcome: Progressing   Problem: Clinical Measurements: Goal: Ability to maintain clinical measurements within normal limits will improve Outcome: Progressing Goal: Will remain free from infection Outcome: Progressing Goal: Diagnostic test results will improve Outcome: Progressing Goal: Respiratory complications will improve Outcome: Progressing Goal: Cardiovascular complication will be avoided Outcome: Progressing   Problem: Activity: Goal: Risk for activity intolerance will decrease Outcome: Progressing   Problem: Nutrition: Goal: Adequate nutrition will be maintained Outcome: Progressing   Problem: Coping: Goal: Level of anxiety will decrease Outcome: Progressing   Problem: Elimination: Goal: Will not experience complications related to bowel motility Outcome: Progressing Goal: Will not experience complications related to urinary retention Outcome: Progressing   Problem: Pain Managment: Goal: General experience of comfort will improve Outcome: Progressing   Problem: Safety: Goal: Ability to remain free from injury will improve Outcome: Progressing   Problem: Skin Integrity: Goal: Risk for impaired skin integrity will decrease Outcome: Progressing

## 2020-04-15 NOTE — Progress Notes (Addendum)
PROGRESS NOTE    Stephanie Griffith   XAJ:[id number]  DOB: 06-Nov-1945  DOA: 04/04/2020 PCP: [name], MD   Brief Narrative:  Stephanie Griffith 75 year old African-American female with a past medical history of chronic diastolic CHF, chronic kidney disease stage V, history of COPD, hyperlipidemia, essential hypertension, paroxysmal atrial fibrillation, pulmonary hypertension who was recently hospitalized for a UTI with altered mentation followed by pneumonia due to COVID-19 and a further decline in mental status. She was discharged on January 3.  She was discharged home per family request.  Recommendation was for her to go to SNF. Presented back to the hospital with worsening confusion.   Subjective: She is lethargic today. Family wants to revert code status to Full code and is requesting dialysis.    Assessment & Plan:   Principal Problem:   Acute metabolic encephalopathy- probable underlying dementia exacerbated by UTI and COVID 19 infectio - suspecting underlying vascular dementia - despite giving Lactulose and ammonia improving to 46, she remains somnolent - d/c Lactulose due to persistent hypokalemia and inability to improve her mental state - imaging of brain unrevealing - EEG showing triphasic morphology was noted. Per report it is on the ictal/interictal continuum. Started on Keppra but mentation worsened and thus it was stopped.  - family asking for a temporary feeding tube which was placed however, her respiratory status worsened therefore held to prevent another aspiration - palliative care consulted- but family feels that all of this is reversible  Family requesting a temporary dialysis cathter to be placed and for the patient to be dialyzed.  Apparently the daughters told the previous attending that their mother was just awake and alert and talking to them and told them that she wanted them to do everything to save her.  At the time of my examination today patient is  lethargic. -Nephrology consulted.  Patient is CKD stage V and had apparently had refused dialysis last year. -At this time unclear goals as the family is wanting everything done.  Active Problems: Aspiration pneumonia with acute respiratory distress - started on Unasyn on 1/9 - blood cultures for 1/6 are negative - having low grade fevers (100) and intermittent tachypnea- - hypoxic and on 3-4 L O2  A-fib with RVR - on Cardizem infusion and receiving IV Metoprolol- HR better controlled and mostly < 100 - cont IV meds for now  Recent COVID 19 infection- tested + on 03/30/20 - treated with Remdesivir and steroids  Nutrition - inability to swallow due to lethargy - a cor trac was discussed between Dr Maryland Pink and the family and placed on 1/11- due to acute aspiration, if respiratory status stabilizes, can start to feed  Severe Hypokalemia, hypomagnesemia - K 3.4, Mg 1.6 - cont to replace via IV as needed-   CKD 5 - she previously declined hemodialysis - started on a bicarb infusion for metabolic acidosis however urine output had dropped considerable a few days ago and despite receiving IVF, she was oliguric- she appeared to be fluid overloaded and Lasix was given    Cr continues to rise.  Family wanting everything including dialysis to be done. -Nephrology consulted.  Normocytic anemia - likely anemia of chronic disease    Poor venous access - PICC line placed on 1/8    Time spent in minutes: 35 DVT prophylaxis: heparin injection 5,000 Units Start: 04/08/20 0730  Code Status: Previously was DNR but family again made her full code Family Communication: Attempted to reach the daughter Disposition Plan:  Status is: Inpatient  Remains inpatient appropriate because:IV treatments appropriate due to intensity of illness or inability to take PO   Dispo: The patient is from: Home              Anticipated d/c is to: TBD              Anticipated d/c date is: > 3 days               Patient currently is not medically stable to d/c.  Consultants:   Palliative care Procedures:   Cor trac Antimicrobials:  Anti-infectives (From admission, onward)   Start     Dose/Rate Route Frequency Ordered Stop   04/10/20 1330  ampicillin-sulbactam (UNASYN) 1.5 g in sodium chloride 0.9 % 100 mL IVPB        1.5 g 200 mL/hr over 30 Minutes Intravenous Every 12 hours 04/10/20 1232 04/15/20 0921       Objective: Vitals:   04/14/20 1604 04/15/20 0014 04/15/20 0346 04/15/20 0824  BP: (!) 119/59 123/66 110/66 130/69  Pulse: 78 89 91 (!) 42  Resp: 17 18 15 20   Temp: 98.5 F (36.9 C) 98.8 F (37.1 C) 98.7 F (37.1 C) 98.5 F (36.9 C)  TempSrc: Oral Oral Oral Axillary  SpO2: 96% 95% 96% 97%  Weight:      Height:        Intake/Output Summary (Last 24 hours) at 04/15/2020 1050 Last data filed at 04/15/2020 0059 Gross per 24 hour  Intake 270.04 ml  Output 1000 ml  Net -729.96 ml   Filed Weights   04/13/2020 1632  Weight: 89.8 kg    Examination: General exam: Patient lethargic HEENT: PERRL, no sclera icterus or thrush- cor track present Respiratory system: Clear to auscultation. Resp rate in 20s. Pulse ox 95% on 3 L O2 Cardiovascular system: S1 & S2   Gastrointestinal system: Abdomen soft, nondistended. Normal bowel sounds   Central nervous system: lethargic, flaccid extermities Extremities: No cyanosis, clubbing or edema Skin: No rashes or ulcers Psychiatry:  cannot assess   Data Reviewed: I have personally reviewed following labs and imaging studies  CBC: Recent Labs  Lab 04/10/20 0254 04/11/20 0500 04/12/20 0512 04/13/20 0452 04/15/20 0543  WBC 14.6* 15.2* 11.2* 13.0* 14.4*  HGB 7.5* 7.9* 8.5* 7.4* 7.9*  HCT 23.0* 25.4* 27.3* 23.1* 24.5*  MCV 90.6 92.7 92.2 91.3 92.1  PLT 154 160 141* 158 627   Basic Metabolic Panel: Recent Labs  Lab 04/11/20 0500 04/12/20 0512 04/13/20 0452 04/13/20 0736 04/14/20 0358 04/15/20 0543  NA 146* 142 139  --  142 141   K 3.0* 3.3* 2.7*  --  3.8 3.4*  CL 111 105 99  --  103 105  CO2 21* 23 27  --  24 22  GLUCOSE 118* 112* 290*  --  82 134*  BUN 65* 63* 55*  --  63* 68*  CREATININE 5.13* 5.31* 5.12*  --  5.78* 5.85*  CALCIUM 7.9* 7.7* 7.1*  --  8.0* 7.7*  MG  --   --   --  1.6*  --   --    GFR: Estimated Creatinine Clearance: 9.9 mL/min (A) (by C-G formula based on SCr of 5.85 mg/dL (H)). Liver Function Tests: No results for input(s): AST, ALT, ALKPHOS, BILITOT, PROT, ALBUMIN in the last 168 hours. No results for input(s): LIPASE, AMYLASE in the last 168 hours. Recent Labs  Lab 04/10/20 0254 04/11/20 0500 04/13/20 0454  AMMONIA 74* 58* 46*  Coagulation Profile: No results for input(s): INR, PROTIME in the last 168 hours. Cardiac Enzymes: No results for input(s): CKTOTAL, CKMB, CKMBINDEX, TROPONINI in the last 168 hours. BNP (last 3 results) No results for input(s): PROBNP in the last 8760 hours. HbA1C: No results for input(s): HGBA1C in the last 72 hours. CBG: Recent Labs  Lab 04/14/20 0801 04/14/20 1150 04/14/20 1618 04/15/20 0447 04/15/20 0832  GLUCAP 84 77 84 114* 141*   Lipid Profile: No results for input(s): CHOL, HDL, LDLCALC, TRIG, CHOLHDL, LDLDIRECT in the last 72 hours. Thyroid Function Tests: No results for input(s): TSH, T4TOTAL, FREET4, T3FREE, THYROIDAB in the last 72 hours. Anemia Panel: Recent Labs    04/15/20 0543  FERRITIN 120  TIBC 132*  IRON 18*   Urine analysis:    Component Value Date/Time   COLORURINE YELLOW 04/17/2020 1318   APPEARANCEUR CLEAR 04/28/2020 1318   LABSPEC 1.015 04/18/2020 1318   PHURINE 5.0 04/26/2020 1318   GLUCOSEU NEGATIVE 04/19/2020 1318   HGBUR NEGATIVE 04/15/2020 1318   BILIRUBINUR NEGATIVE 04/19/2020 1318   KETONESUR NEGATIVE 04/04/2020 1318   PROTEINUR 100 (A) 04/21/2020 1318   UROBILINOGEN 0.2 06/18/2011 1751   NITRITE NEGATIVE 04/13/2020 1318   LEUKOCYTESUR NEGATIVE 04/25/2020 1318   Sepsis  Labs: @LABRCNTIP (procalcitonin:4,lacticidven:4) ) Recent Results (from the past 240 hour(s))  Culture, blood (single)     Status: None   Collection Time: 04/05/2020  1:27 PM   Specimen: BLOOD LEFT FOREARM  Result Value Ref Range Status   Specimen Description BLOOD LEFT FOREARM  Final   Special Requests   Final    BOTTLES DRAWN AEROBIC AND ANAEROBIC Blood Culture results may not be optimal due to an inadequate volume of blood received in culture bottles   Culture   Final    NO GROWTH 5 DAYS Performed at [facility], [place] [address], [place], [place] [address]    Report Status 04/12/2020 FINAL  Final  Urine culture     Status: None   Collection Time: 04/05/2020  3:02 PM   Specimen: Urine, Random  Result Value Ref Range Status   Specimen Description URINE, RANDOM  Final   Special Requests NONE  Final   Culture   Final    NO GROWTH Performed at [facility], [place] [address] [place], [place] [address]    Report Status 04/08/2020 FINAL  Final         Radiology Studies: No results found.    Scheduled Meds: . Chlorhexidine Gluconate Cloth  6 each Topical Daily  . [START ON 04/21/2020] darbepoetin (ARANESP) injection - NON-DIALYSIS  200 mcg Subcutaneous Q Thu-1800  . feeding supplement (PROSource TF)  45 mL Per Tube Daily  . free water  150 mL Per Tube Q6H  . heparin  5,000 Units Subcutaneous Q8H  . metoprolol tartrate  5 mg Intravenous Q6H  . sodium chloride flush  10-40 mL Intracatheter Q12H   Continuous Infusions: . diltiazem (CARDIZEM) infusion Stopped (04/13/20 1444)  . feeding supplement (OSMOLITE 1.2 CAL) 40 mL/hr at 04/15/20 0540     LOS: 7 days      Yaakov Guthrie, MD [facility] Pager: [email or web address] 04/15/2020, 10:50 AM

## 2020-04-15 NOTE — Progress Notes (Signed)
  Speech Language Pathology  Patient Details Name: Stephanie Griffith MRN: [id number] DOB: 1946-01-11 Today's Date: 04/15/2020 Time:  -      Reviewed chart and noted meetings with family and Palliative care. Pt has been seen by ST 1/8 and 1/11 and several other attempts and puree/thin liquids were recommended both times. Pt was made NPO due to decreased responsiveness. She currently has a Water quality scientist. Noted pt is now full code. Discussed with Dr. Barth Kirks recommendation of an MBS to objectively assess for safest recommendations. Radiology was unable to accommodate MBS today and plans are possibly for Monday. ST will follow up               Houston Siren 04/15/2020, 3:52 PM  Orbie Pyo Colvin Caroli.Ed Risk analyst [phone] Office [phone]

## 2020-04-15 NOTE — Progress Notes (Signed)
[facility] night shift.  The staff reports the patient's heart rhythm continues to be atrial fibrillation/flutter.  Her heart rate is currently in the 70s. She is still being treated with Cardizem infusion and metoprolol IV pushes as she remains n.p.o.  Anticoagulation has been deferred in the past due to significant history of anemia and has only been on aspirin.  Tennis Must, MD.

## 2020-04-15 NOTE — Plan of Care (Signed)
  Problem: Safety: Goal: Ability to remain free from injury will improve Outcome: Progressing   Problem: Skin Integrity: Goal: Risk for impaired skin integrity will decrease Outcome: Progressing   Problem: Education: Goal: Knowledge of General Education information will improve Description: Including pain rating scale, medication(s)/side effects and non-pharmacologic comfort measures Outcome: Progressing   

## 2020-04-15 NOTE — Progress Notes (Signed)
Physical Therapy Treatment Patient Details Name: Stephanie Griffith MRN: [id number] DOB: 1946-03-13 Today's Date: 04/15/2020    History of Present Illness Pt is a 75 y.o. female with a medical hx significant for pulmonary HTN, L AKA, PAT, PAF, PAD, HTN, hyperglycemia, COPD, chronic renal insufficiency stage IV, CHF, and anemia who presents with increased confusion and decreased Po instake since her ecent admission for COVID pneumonia and encephalopathy (D/C'd 04/04/20). Suspect underlying dementia with sundowning. Pt admitted for acute encephalopathy due to COVID-19. EEG showed: generalized periodic discharges with triphasic morphology at 2hz  which is on the ictal-interictal continuum and can sometimes be seen with toxic-metabolic etiology, moderate to severe diffuse encephalopathy with nonspecific etiology, and no seizures were seen. 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    PT Comments    Pt with eyes open this date, but blank gaze towards R throughout session. Only followed <5% of simple commands, primarily with the R UE. Pt continues to require TAx2 for all bed mob due to her decreased awareness and ability to follow directions. Initially, pt displayed no reactional strategies sitting statically EOB requiring TA to sit up, but by end of session pt able to hold self up with delayed reactional strategies for up to ~25 sec with min guard assist before requiring further assistance to maintain static sitting balance. Pt able to hold balance sitting leaning on R elbow even while receiving min perturbations to the trunk for > 1 min. PNF D1 flexion and extension techniques performed on bilat UEs to improve relaxation and facilitate decreased tone and muscle activation, with pt only activating minimally for R D1 extension. Cervical stretches provided to improve L cervical rotation and lateral flexion. Positioned pt end of session with R heel floating and  pt rolled towards L with pillow and towel roll under R side of head for improved midline alignment. Will continue to follow acutely. Current recommendations remain appropriate.  Follow Up Recommendations  SNF;Supervision/Assistance - 24 hour     Equipment Recommendations  Hospital bed    Recommendations for Other Services       Precautions / Restrictions Precautions Precautions: Fall Precaution Comments: L AKA, prosthesis no longer fits has not worn for a long time. Restrictions Weight Bearing Restrictions: No LLE Weight Bearing: Non weight bearing    Mobility  Bed Mobility Overal bed mobility: Needs Assistance Bed Mobility: Supine to Sit;Sit to Supine;Rolling Rolling: Total assist;+2 for physical assistance;+2 for safety/equipment   Supine to sit: Total assist;+2 for physical assistance;+2 for safety/equipment Sit to supine: Total assist;+2 for physical assistance;+2 for safety/equipment   General bed mobility comments: Total assist +2 for all aspects. Patient does not initiate or follow commands.  Transfers                 General transfer comment: Deferred due to safety.  Ambulation/Gait             General Gait Details: Unable-WC bound   Marine scientist Rankin (Stroke Patients Only)       Balance Overall balance assessment: Needs assistance Sitting-balance support: Bilateral upper extremity supported (R foot supported) Sitting balance-Leahy Scale: Poor Sitting balance - Comments: R lateral and posterior lean. Initially, TA to maintain static sitting balance with no reactional strategies but as time in sitting EOB progressed pt began to demonstrate delayed reactional strategies to posterior bouts of LOB with  maxA to recover. By end, pt able to hold sttaic sitting upright without external support with min guard for up to ~25 sec before fatigued and leaned onto R elbow, holding this position > 1 min with min  guard and no LOB even with min perturbations to trunk. Postural control: Right lateral lean;Posterior lean     Standing balance comment: deferred due to safety concerns.                            Cognition Arousal/Alertness: Lethargic Behavior During Therapy: Flat affect Overall Cognitive Status: Impaired/Different from baseline                                 General Comments: Patient with eyes open but blank gaze throughout. Responded to name being called with ~15% accuracy and stated "yes" to if her name was Museum/gallery conservator. Min initiation or functional participation with therapy efforts this date. Followed <5% of cues.      Exercises Other Exercises Other Exercises: PROM to neck into L lateral flexion, 2x ~30 sec Other Exercises: PROM into L cervical rotation ~20-30 sec x2 reps Other Exercises: PNF bilat UEs into D1 flexion and extension, PROM no initiation on L and PROM into flexion on R but intermittent AAROM    General Comments General comments (skin integrity, edema, etc.): HR 90s-112 bpm during session      Pertinent Vitals/Pain Pain Assessment: Faces Faces Pain Scale: Hurts a little bit Pain Location: Gneralized with mobility Pain Descriptors / Indicators: Grimacing;Guarding Pain Intervention(s): Limited activity within patient's tolerance;Monitored during session;Repositioned    Home Living                      Prior Function            PT Goals (current goals can now be found in the care plan section) Acute Rehab PT Goals Patient Stated Goal: Unable to state PT Goal Formulation: Patient unable to participate in goal setting Time For Goal Achievement: 26-Apr-2020 Potential to Achieve Goals: Fair Progress towards PT goals: Progressing toward goals (slowly)    Frequency    Min 3X/week      PT Plan Current plan remains appropriate    Co-evaluation              AM-PAC PT "6 Clicks" Mobility   Outcome Measure  Help needed  turning from your back to your side while in a flat bed without using bedrails?: Total Help needed moving from lying on your back to sitting on the side of a flat bed without using bedrails?: Total Help needed moving to and from a bed to a chair (including a wheelchair)?: Total Help needed standing up from a chair using your arms (e.g., wheelchair or bedside chair)?: Total Help needed to walk in hospital room?: Total Help needed climbing 3-5 steps with a railing? : Total 6 Click Score: 6    End of Session Equipment Utilized During Treatment: Oxygen Activity Tolerance: Other (comment);Patient limited by fatigue (limited by pt ability to pay attention and follow directions) Patient left: in bed;with call bell/phone within reach;with bed alarm set   PT Visit Diagnosis: Muscle weakness (generalized) (M62.81)     Time: 1448-1856 PT Time Calculation (min) (ACUTE ONLY): 25 min  Charges:  $Therapeutic Activity: 8-22 mins $Neuromuscular Re-education: 8-22 mins  Moishe Spice, PT, DPT Acute Rehabilitation Services  Pager: [phone] Office: [place] 04/15/2020, 10:56 AM

## 2020-04-15 NOTE — Progress Notes (Signed)
[facility] Progress Note    Assessment/ Plan:   1. CKD5 -Cr stable this am, urine output better -no indications for renal replacement therapy, no access needed at the moment -updated daughter [name] over the phone -appreciate palliative care's assistance -time trial of dialysis requested per family if it comes to that. Of note, she is not a candidate for dialysis and that this would just be futile and may cause more harm than good. It is clear cut that the family does not agree with this. Unrealistic expectations from family -I am not going to use her mental status as an indication at the moment. The reason is because she has underlying vascular dementia, COVID, UTI which are confounding factors. -Per office progress note, patient herself refused to have dialysis done per the conversations between her and Dr. Hollie Salk (primary nephrologist) -Avoid nephrotoxic medications including NSAIDs and iodinated intravenous contrast exposure unless the latter is absolutely indicated.  Preferred narcotic agents for pain control are hydromorphone, fentanyl, and methadone. Morphine should not be used. Avoid Baclofen and avoid oral sodium phosphate and magnesium citrate based laxatives / bowel preps. Continue strict Input and Output monitoring. Will monitor the patient closely with you and intervene or adjust therapy as indicated by changes in clinical status/labs   2. Acute metabolic encephalopathy -pall care on board. Mgmt per primary service  3. Aspiration PNA -unasyn, mgmt per primary service  4. Recent COVID, 03/30/20  5. Metabolic acidosis, resolved 6. HTN/volume -slightly volume up but dependent -VSS 7. Anemia -starting iron, 250mg  qweekly   Subjective:   No acute events. uop 1L   Objective:   BP (!) 143/84 (BP Location: Left Arm)   Pulse 98   Temp 98.3 F (36.8 C) (Axillary)   Resp 16   Ht 5\' 8"  (1.727 m)   Wt 89.8 kg   SpO2 97%   BMI 30.10 kg/m   Intake/Output  Summary (Last 24 hours) at 04/15/2020 1533 Last data filed at 04/15/2020 0059 Gross per 24 hour  Intake 270.04 ml  Output 1000 ml  Net -729.96 ml   Weight change:   Physical Exam: Gen: ill appearing CVS:reg rate Resp:normal wob FWY:OVZCH, soft Ext:+dependent edema Neuro: awake, does not follow commands, nonverbal, moves head around intermittently  Imaging: No results found.  Labs: BMET Recent Labs  Lab 04/09/20 0418 04/10/20 0254 04/10/20 1732 04/11/20 0500 04/12/20 0512 04/13/20 0452 04/14/20 0358 04/15/20 0543  NA 149* 142  --  146* 142 139 142 141  K 4.1 2.6* 3.3* 3.0* 3.3* 2.7* 3.8 3.4*  CL 116* 108  --  111 105 99 103 105  CO2 16* 22  --  21* 23 27 24 22   GLUCOSE 110* 276*  --  118* 112* 290* 82 134*  BUN 68* 61*  --  65* 63* 55* 63* 68*  CREATININE 4.76* 4.52*  --  5.13* 5.31* 5.12* 5.78* 5.85*  CALCIUM 8.5* 7.3*  --  7.9* 7.7* 7.1* 8.0* 7.7*   CBC Recent Labs  Lab 04/11/20 0500 04/12/20 0512 04/13/20 0452 04/15/20 0543  WBC 15.2* 11.2* 13.0* 14.4*  HGB 7.9* 8.5* 7.4* 7.9*  HCT 25.4* 27.3* 23.1* 24.5*  MCV 92.7 92.2 91.3 92.1  PLT 160 141* 158 154    Medications:    . Chlorhexidine Gluconate Cloth  6 each Topical Daily  . [START ON 04/21/2020] darbepoetin (ARANESP) injection - NON-DIALYSIS  200 mcg Subcutaneous Q Thu-1800  . feeding supplement (PROSource TF)  45 mL Per Tube Daily  .  free water  150 mL Per Tube Q6H  . heparin  5,000 Units Subcutaneous Q8H  . metoprolol tartrate  5 mg Intravenous Q6H  . sodium chloride flush  10-40 mL Intracatheter Q12H      Gean Quint, MD [facility] 04/15/2020, 3:33 PM

## 2020-04-16 DIAGNOSIS — N184 Chronic kidney disease, stage 4 (severe): Secondary | ICD-10-CM | POA: Diagnosis not present

## 2020-04-16 LAB — CBC
HCT: 23.8 % — ABNORMAL LOW (ref 36.0–46.0)
Hemoglobin: 7.7 g/dL — ABNORMAL LOW (ref 12.0–15.0)
MCH: 30.2 pg (ref 26.0–34.0)
MCHC: 32.4 g/dL (ref 30.0–36.0)
MCV: 93.3 fL (ref 80.0–100.0)
Platelets: 163 10*3/uL (ref 150–400)
RBC: 2.55 MIL/uL — ABNORMAL LOW (ref 3.87–5.11)
RDW: 17.9 % — ABNORMAL HIGH (ref 11.5–15.5)
WBC: 12.8 10*3/uL — ABNORMAL HIGH (ref 4.0–10.5)
nRBC: 0.3 % — ABNORMAL HIGH (ref 0.0–0.2)

## 2020-04-16 LAB — GLUCOSE, CAPILLARY
Glucose-Capillary: 113 mg/dL — ABNORMAL HIGH (ref 70–99)
Glucose-Capillary: 115 mg/dL — ABNORMAL HIGH (ref 70–99)
Glucose-Capillary: 119 mg/dL — ABNORMAL HIGH (ref 70–99)
Glucose-Capillary: 122 mg/dL — ABNORMAL HIGH (ref 70–99)
Glucose-Capillary: 129 mg/dL — ABNORMAL HIGH (ref 70–99)
Glucose-Capillary: 131 mg/dL — ABNORMAL HIGH (ref 70–99)

## 2020-04-16 LAB — MAGNESIUM: Magnesium: 2.3 mg/dL (ref 1.7–2.4)

## 2020-04-16 LAB — BASIC METABOLIC PANEL
Anion gap: 12 (ref 5–15)
BUN: 71 mg/dL — ABNORMAL HIGH (ref 8–23)
CO2: 24 mmol/L (ref 22–32)
Calcium: 7.5 mg/dL — ABNORMAL LOW (ref 8.9–10.3)
Chloride: 103 mmol/L (ref 98–111)
Creatinine, Ser: 5.85 mg/dL — ABNORMAL HIGH (ref 0.44–1.00)
GFR, Estimated: 7 mL/min — ABNORMAL LOW (ref >60)
Glucose, Bld: 122 mg/dL — ABNORMAL HIGH (ref 70–99)
Potassium: 3.8 mmol/L (ref 3.5–5.1)
Sodium: 139 mmol/L (ref 135–145)

## 2020-04-16 MED ORDER — METOPROLOL TARTRATE 50 MG PO TABS
100.0000 mg | ORAL_TABLET | Freq: Two times a day (BID) | ORAL | Status: DC
  Administered 2020-04-16 (×2): 100 mg via ORAL
  Filled 2020-04-16 (×2): qty 2

## 2020-04-16 MED ORDER — METOPROLOL TARTRATE 25 MG/10 ML ORAL SUSPENSION
100.0000 mg | Freq: Two times a day (BID) | ORAL | Status: DC
  Administered 2020-04-17 – 2020-04-22 (×12): 100 mg
  Filled 2020-04-16 (×13): qty 40

## 2020-04-16 MED ORDER — ACETAMINOPHEN 160 MG/5ML PO SOLN
650.0000 mg | Freq: Four times a day (QID) | ORAL | Status: DC | PRN
  Administered 2020-04-18 – 2020-04-22 (×4): 650 mg
  Filled 2020-04-16 (×5): qty 20.3

## 2020-04-16 NOTE — Progress Notes (Signed)
Will give pain medication and re-assess.  04/16/20 0848  Assess: MEWS Score  Temp 99 F (37.2 C)  BP (!) 114/59  Pulse Rate 90  ECG Heart Rate 94  Resp (!) 27  Level of Consciousness Unresponsive  SpO2 97 %  O2 Device Nasal Cannula  O2 Flow Rate (L/min) 4 L/min  Assess: MEWS Score  MEWS Temp 0  MEWS Systolic 0  MEWS Pulse 0  MEWS RR 2  MEWS LOC 3  MEWS Score 5  MEWS Score Color Red  Assess: if the MEWS score is Yellow or Red  Were vital signs taken at a resting state? Yes  Focused Assessment Change from prior assessment (see assessment flowsheet)  Early Detection of Sepsis Score *See Row Information* Low  MEWS guidelines implemented *See Row Information* No, altered LOC is baseline  Treat  MEWS Interventions Administered prn meds/treatments  Pain Scale PAINAD  Pain Score 5  Pain Intervention(s) Medication (See eMAR)  Breathing 2  Negative Vocalization 1  Facial Expression 1  Body Language 1  Consolability 0  PAINAD Score 5  Take Vital Signs  Increase Vital Sign Frequency  Red: Q 1hr X 4 then Q 4hr X 4, if remains red, continue Q 4hrs  Escalate  MEWS: Escalate Red: discuss with charge nurse/RN and provider, consider discussing with RRT  Notify: Charge Nurse/RN  Name of Charge Nurse/RN Notified Angela, RN  Date Charge Nurse/RN Notified 04/16/20  Time Charge Nurse/RN Notified 0848  Notify: Provider  Provider Name/Title Ghimire  Date Provider Notified 04/16/20  Time Provider Notified [phone]  Notification Type Page  Notification Reason Other (Comment) (change in respiratory rate)  Document  Progress note created (see row info) Yes

## 2020-04-16 NOTE — Progress Notes (Signed)
[facility] Progress Note    Assessment/ Plan:   1. CKD5, ESRD -Cr stable this am, BUN slightly up. No improvement in mental status unfortunately. UOP dropped -updated daughter [name] over the phone and discussed with staff and primary service -appreciate palliative care's assistance -will go ahead and start HD given that there has been zero improvement in her mental status. Uremia is a variable that will be addressed via renal replacement therapy -she is not a long term dialysis candidate given her multiple comborbidities and functional status (lack thereof). Therefore, will set her up for a temporary catheter (NO permanent access for now) via IR (appreciate assistance) -will plan for only 3 dialysis treatments (slow start protocol). Tentatively, will start Monday and will do HD again on Tues and Wed. If there is no improvement in mental status, then I recommend stopping dialysis altogether and removing the catheter and having a realistic conversation with the family. If there any issues with this process, then I would recommend having a very low threshold to consult ethics. If needed, can offer a second opinion however that would entail her to be transferred to another facility -Daughter [name]) is in agreement with this plan and is appreciative -Per office progress note, patient herself refused to have dialysis done per the conversations between her and Dr. Hollie Salk (primary nephrologist). Family has rescinded all of patient's decisions -Avoid nephrotoxic medications including NSAIDs and iodinated intravenous contrast exposure unless the latter is absolutely indicated.  Preferred narcotic agents for pain control are hydromorphone, fentanyl, and methadone. Morphine should not be used. Avoid Baclofen and avoid oral sodium phosphate and magnesium citrate based laxatives / bowel preps. Continue strict Input and Output monitoring. Will monitor the patient closely with you and intervene or  adjust therapy as indicated by changes in clinical status/labs   2. Acute metabolic encephalopathy -pall care on board. Mgmt per primary service -see above  3. Aspiration PNA -unasyn, mgmt per primary service  4. Recent COVID, 03/30/20  5. Metabolic acidosis, resolved 6. HTN/volume -slightly volume up but dependent edema, chronic stasis changes bilateral LE's -VSS 7. Anemia -starting iron, 250mg  qweekly   Subjective:   No acute events. uop dropped to 0.5L   Objective:   BP (!) 114/59 (BP Location: Left Arm)   Pulse 90   Temp 99 F (37.2 C) (Oral)   Resp (!) 27   Ht 5\' 8"  (1.727 m)   Wt 89.8 kg   SpO2 97%   BMI 30.10 kg/m   Intake/Output Summary (Last 24 hours) at 04/16/2020 1147 Last data filed at 04/16/2020 0700 Gross per 24 hour  Intake 1075.6 ml  Output 560 ml  Net 515.6 ml   Weight change:   Physical Exam: Gen: chronically ill appearing CVS:reg rate Resp:normal wob CHE:NIDPO, soft Ext:+dependent edema Neuro: nonverbal, unresponsive  Imaging: No results found.  Labs: BMET Recent Labs  Lab 04/10/20 0254 04/10/20 1732 04/11/20 0500 04/12/20 0512 04/13/20 0452 04/14/20 0358 04/15/20 0543 04/16/20 0439  NA 142  --  146* 142 139 142 141 139  K 2.6* 3.3* 3.0* 3.3* 2.7* 3.8 3.4* 3.8  CL 108  --  111 105 99 103 105 103  CO2 22  --  21* 23 27 24 22 24   GLUCOSE 276*  --  118* 112* 290* 82 134* 122*  BUN 61*  --  65* 63* 55* 63* 68* 71*  CREATININE 4.52*  --  5.13* 5.31* 5.12* 5.78* 5.85* 5.85*  CALCIUM 7.3*  --  7.9* 7.7*  7.1* 8.0* 7.7* 7.5*   CBC Recent Labs  Lab 04/12/20 0512 04/13/20 0452 04/15/20 0543 04/16/20 0439  WBC 11.2* 13.0* 14.4* 12.8*  HGB 8.5* 7.4* 7.9* 7.7*  HCT 27.3* 23.1* 24.5* 23.8*  MCV 92.2 91.3 92.1 93.3  PLT 141* 158 154 163    Medications:    . Chlorhexidine Gluconate Cloth  6 each Topical Daily  . [START ON 04/21/2020] darbepoetin (ARANESP) injection - NON-DIALYSIS  200 mcg Subcutaneous Q Thu-1800  . feeding  supplement (PROSource TF)  45 mL Per Tube Daily  . free water  150 mL Per Tube Q6H  . heparin  5,000 Units Subcutaneous Q8H  . metoprolol tartrate  5 mg Intravenous Q6H  . sodium chloride flush  10-40 mL Intracatheter Q12H      Gean Quint, MD [facility] 04/16/2020, 11:47 AM

## 2020-04-16 NOTE — Progress Notes (Signed)
PROGRESS NOTE    Stephanie Griffith  ZOX:[id number] DOB: 02-21-1946 DOA: 04/12/2020 PCP: [name], MD    Brief Narrative:  75 year old female with history of chronic diastolic heart failure, chronic kidney disease stage V, history of COPD, hypertension hyperlipidemia, paroxysmal A. fib and pulmonary hypertension with vascular dementia who was recently hospitalized for UTI, COVID-19 pneumonia and had further deterioration of her mental status.  She was discharged home on January 3 on family request.  Came back to emergency room on January 6 with worsening confusion, not eating. Admitted with metabolic encephalopathy.  Treated for aspiration pneumonia.  Remains persistently encephalopathic. Reportedly patient had declined to have hemodialysis at outpatient follow-ups. 1/13, daughters rescinded DNR, decided to pursue with all treatments.  Tube feeding started.  Assessment & Plan:   Principal Problem:   Acute metabolic encephalopathy Active Problems:   Normocytic anemia   Leukocytosis   PAD (peripheral artery disease) (HCC)   S/P AKA (above knee amputation) (HCC)   Chronic renal insufficiency, stage IV (severe) (HCC)   Metabolic acidosis   Chronic diastolic CHF (congestive heart failure) (HCC)   Encephalopathy due to COVID-19 virus   Dyslipidemia   Acute encephalopathy   Atrial fibrillation with RVR ([place])   Advanced care planning/counseling discussion   Goals of care, counseling/discussion  Acute metabolic encephalopathy, profound encephalopathy with underlying vascular dementia.  Probably exacerbated by recent UTI and COVID-19 infection. Currently on symptomatic treatment.  Remains persistently encephalopathic. Dobbhoff tube feeding, tolerating well. Oxygenation is stable. Palliative care consulted, family decided to go aggressive treatment. EEG showed triphasic morphology, started on Keppra that was stopped because of mentation. Uremia might have added to her overall  encephalopathy. Patient not a permanent dialysis candidate, however family want to see if she responds to temporary dialysis. If patient responds to temporary dialysis, will pursue rehab. If patient does not respond to temporary dialysis, goal of care discussion will be revisited.  Suspected aspiration pneumonia: Completed 5 days of antibiotics.  Rapid A. fib: Rate controlled.  Currently on Cardizem drip 5 mg/h.  Discontinue.  Resume her home dose of metoprolol.  Not an anticoagulation candidate with altered mental status.  Hypokalemia/hypomagnesemia: Replaced.  CKD stage V: As above.  Creatinine continues to worsen.  Mental status continues to worsen. Getting IV iron today. As above, planning to start on temporary dialysis.  Case discussed with nephrology.  Case discussed with patient's daughters. We discussed about difficult to treat conditions, unsure about outcome.   DVT prophylaxis: heparin injection 5,000 Units Start: 04/08/20 0730   Code Status: Full code Family Communication: Patient's daughter [name] at the bedside, another daughter on the phone Disposition Plan: Status is: Inpatient  Remains inpatient appropriate because:Inpatient level of care appropriate due to severity of illness   Dispo: The patient is from: Home              Anticipated d/c is to: SNF              Anticipated d/c date is: > 3 days              Patient currently is not medically stable to d/c.         Consultants:   Nephrology  Palliative medicine  Procedures:   None  Antimicrobials:   Completed   Subjective: Patient seen and examined.  She is unresponsive.  She occasionally UTTERS some incomprehensible words but does not follow commands. Patient's daughter Ms. [name] was at the bedside.  Objective: Vitals:   04/16/20  0355 04/16/20 0848 04/16/20 1317 04/16/20 1318  BP: 124/79 (!) 114/59  (!) 133/56  Pulse: 86 90  (!) 47  Resp: 18 (!) 27  (!) 27  Temp: 97.8 F (36.6 C) 99  F (37.2 C) 99.1 F (37.3 C)   TempSrc: Oral Oral Oral   SpO2: 98% 97%  96%  Weight:      Height:        Intake/Output Summary (Last 24 hours) at 04/16/2020 1541 Last data filed at 04/16/2020 0700 Gross per 24 hour  Intake 1075.6 ml  Output 560 ml  Net 515.6 ml   Filed Weights   04/10/2020 1632  Weight: 89.8 kg    Examination:  General exam: Sick looking.  Lethargic and unresponsive. Respiratory system: Clear to auscultation. Respiratory effort normal.  Some conducted airway sounds. Cardiovascular system: S1 & S2 heard, irregularly irregular. 1+ pedal edema both legs.  Pedal edema left hand. Gastrointestinal system: Soft.  Nontender.  Bowel sounds present.   Central nervous system: Lethargic.  Unresponding.  Difficult to keep awake. Does not follow any commands.    Data Reviewed: I have personally reviewed following labs and imaging studies  CBC: Recent Labs  Lab 04/11/20 0500 04/12/20 0512 04/13/20 0452 04/15/20 0543 04/16/20 0439  WBC 15.2* 11.2* 13.0* 14.4* 12.8*  HGB 7.9* 8.5* 7.4* 7.9* 7.7*  HCT 25.4* 27.3* 23.1* 24.5* 23.8*  MCV 92.7 92.2 91.3 92.1 93.3  PLT 160 141* 158 154 540   Basic Metabolic Panel: Recent Labs  Lab 04/12/20 0512 04/13/20 0452 04/13/20 0736 04/14/20 0358 04/15/20 0543 04/16/20 0439  NA 142 139  --  142 141 139  K 3.3* 2.7*  --  3.8 3.4* 3.8  CL 105 99  --  103 105 103  CO2 23 27  --  24 22 24   GLUCOSE 112* 290*  --  82 134* 122*  BUN 63* 55*  --  63* 68* 71*  CREATININE 5.31* 5.12*  --  5.78* 5.85* 5.85*  CALCIUM 7.7* 7.1*  --  8.0* 7.7* 7.5*  MG  --   --  1.6*  --   --  2.3   GFR: Estimated Creatinine Clearance: 9.9 mL/min (A) (by C-G formula based on SCr of 5.85 mg/dL (H)). Liver Function Tests: No results for input(s): AST, ALT, ALKPHOS, BILITOT, PROT, ALBUMIN in the last 168 hours. No results for input(s): LIPASE, AMYLASE in the last 168 hours. Recent Labs  Lab 04/10/20 0254 04/11/20 0500 04/13/20 0454  AMMONIA  74* 58* 46*   Coagulation Profile: No results for input(s): INR, PROTIME in the last 168 hours. Cardiac Enzymes: No results for input(s): CKTOTAL, CKMB, CKMBINDEX, TROPONINI in the last 168 hours. BNP (last 3 results) No results for input(s): PROBNP in the last 8760 hours. HbA1C: No results for input(s): HGBA1C in the last 72 hours. CBG: Recent Labs  Lab 04/15/20 1931 04/15/20 2359 04/16/20 0352 04/16/20 0848 04/16/20 1316  GLUCAP 118* 131* 119* 129* 122*   Lipid Profile: No results for input(s): CHOL, HDL, LDLCALC, TRIG, CHOLHDL, LDLDIRECT in the last 72 hours. Thyroid Function Tests: No results for input(s): TSH, T4TOTAL, FREET4, T3FREE, THYROIDAB in the last 72 hours. Anemia Panel: Recent Labs    04/15/20 0543  FERRITIN 120  TIBC 132*  IRON 18*   Sepsis Labs: No results for input(s): PROCALCITON, LATICACIDVEN in the last 168 hours.  Recent Results (from the past 240 hour(s))  Culture, blood (single)     Status: None   Collection Time:  04/21/2020  1:27 PM   Specimen: BLOOD LEFT FOREARM  Result Value Ref Range Status   Specimen Description BLOOD LEFT FOREARM  Final   Special Requests   Final    BOTTLES DRAWN AEROBIC AND ANAEROBIC Blood Culture results may not be optimal due to an inadequate volume of blood received in culture bottles   Culture   Final    NO GROWTH 5 DAYS Performed at [facility], [place] [address], [place], [place] [address]    Report Status 04/12/2020 FINAL  Final  Urine culture     Status: None   Collection Time: 04/03/2020  3:02 PM   Specimen: Urine, Random  Result Value Ref Range Status   Specimen Description URINE, RANDOM  Final   Special Requests NONE  Final   Culture   Final    NO GROWTH Performed at [facility], [place] [address], [place], [place] [address]    Report Status 04/08/2020 FINAL  Final         Radiology Studies: No results found.      Scheduled Meds: . Chlorhexidine Gluconate Cloth  6 each Topical Daily   . [START ON 04/21/2020] darbepoetin (ARANESP) injection - NON-DIALYSIS  200 mcg Subcutaneous Q Thu-1800  . feeding supplement (PROSource TF)  45 mL Per Tube Daily  . free water  150 mL Per Tube Q6H  . heparin  5,000 Units Subcutaneous Q8H  . metoprolol tartrate  100 mg Oral BID  . sodium chloride flush  10-40 mL Intracatheter Q12H   Continuous Infusions: . feeding supplement (OSMOLITE 1.2 CAL) 60 mL/hr at 04/16/20 0700  . ferric gluconate (FERRLECIT/NULECIT) IV Stopped (04/15/20 1922)     LOS: 8 days    Time spent: 35 minutes    Barb Merino, MD [facility] Pager [phone]

## 2020-04-17 ENCOUNTER — Inpatient Hospital Stay (HOSPITAL_COMMUNITY): Payer: Medicare Other

## 2020-04-17 DIAGNOSIS — N184 Chronic kidney disease, stage 4 (severe): Secondary | ICD-10-CM | POA: Diagnosis not present

## 2020-04-17 HISTORY — PX: IR FLUORO GUIDE CV LINE RIGHT: IMG2283

## 2020-04-17 HISTORY — PX: IR US GUIDE VASC ACCESS RIGHT: IMG2390

## 2020-04-17 LAB — GLUCOSE, CAPILLARY
Glucose-Capillary: 115 mg/dL — ABNORMAL HIGH (ref 70–99)
Glucose-Capillary: 116 mg/dL — ABNORMAL HIGH (ref 70–99)
Glucose-Capillary: 117 mg/dL — ABNORMAL HIGH (ref 70–99)
Glucose-Capillary: 130 mg/dL — ABNORMAL HIGH (ref 70–99)
Glucose-Capillary: 139 mg/dL — ABNORMAL HIGH (ref 70–99)

## 2020-04-17 LAB — BASIC METABOLIC PANEL
Anion gap: 12 (ref 5–15)
BUN: 76 mg/dL — ABNORMAL HIGH (ref 8–23)
CO2: 23 mmol/L (ref 22–32)
Calcium: 7.5 mg/dL — ABNORMAL LOW (ref 8.9–10.3)
Chloride: 104 mmol/L (ref 98–111)
Creatinine, Ser: 5.73 mg/dL — ABNORMAL HIGH (ref 0.44–1.00)
GFR, Estimated: 7 mL/min — ABNORMAL LOW (ref >60)
Glucose, Bld: 117 mg/dL — ABNORMAL HIGH (ref 70–99)
Potassium: 4.3 mmol/L (ref 3.5–5.1)
Sodium: 139 mmol/L (ref 135–145)

## 2020-04-17 LAB — CBC WITH DIFFERENTIAL/PLATELET
Abs Immature Granulocytes: 0.54 10*3/uL — ABNORMAL HIGH (ref 0.00–0.07)
Basophils Absolute: 0 10*3/uL (ref 0.0–0.1)
Basophils Relative: 0 %
Eosinophils Absolute: 0.6 10*3/uL — ABNORMAL HIGH (ref 0.0–0.5)
Eosinophils Relative: 5 %
HCT: 24.7 % — ABNORMAL LOW (ref 36.0–46.0)
Hemoglobin: 7.6 g/dL — ABNORMAL LOW (ref 12.0–15.0)
Immature Granulocytes: 4 %
Lymphocytes Relative: 8 %
Lymphs Abs: 1 10*3/uL (ref 0.7–4.0)
MCH: 29.2 pg (ref 26.0–34.0)
MCHC: 30.8 g/dL (ref 30.0–36.0)
MCV: 95 fL (ref 80.0–100.0)
Monocytes Absolute: 1.2 10*3/uL — ABNORMAL HIGH (ref 0.1–1.0)
Monocytes Relative: 10 %
Neutro Abs: 9.2 10*3/uL — ABNORMAL HIGH (ref 1.7–7.7)
Neutrophils Relative %: 73 %
Platelets: 169 10*3/uL (ref 150–400)
RBC: 2.6 MIL/uL — ABNORMAL LOW (ref 3.87–5.11)
RDW: 18.1 % — ABNORMAL HIGH (ref 11.5–15.5)
WBC: 12.6 10*3/uL — ABNORMAL HIGH (ref 4.0–10.5)
nRBC: 1.5 % — ABNORMAL HIGH (ref 0.0–0.2)

## 2020-04-17 LAB — BLOOD GAS, ARTERIAL
Acid-base deficit: 0.6 mmol/L (ref 0.0–2.0)
Bicarbonate: 23 mmol/L (ref 20.0–28.0)
Drawn by: 1116122
FIO2: 36
O2 Saturation: 94.3 %
Patient temperature: 37.5
pCO2 arterial: 34.6 mmHg (ref 32.0–48.0)
pH, Arterial: 7.44 (ref 7.350–7.450)
pO2, Arterial: 74.3 mmHg — ABNORMAL LOW (ref 83.0–108.0)

## 2020-04-17 IMAGING — XA IR FLUORO GUIDE CV LINE*R*
2 series · 3 of 3 positions shown · non-contrast
Comparison: none

INDICATION: 74-year-old with renal failure. Request for non tunneled dialysis
catheter.

[Series 1: ir fluoro guide cv line*right* · 1 of 1 slices shown]
[im 1/1]
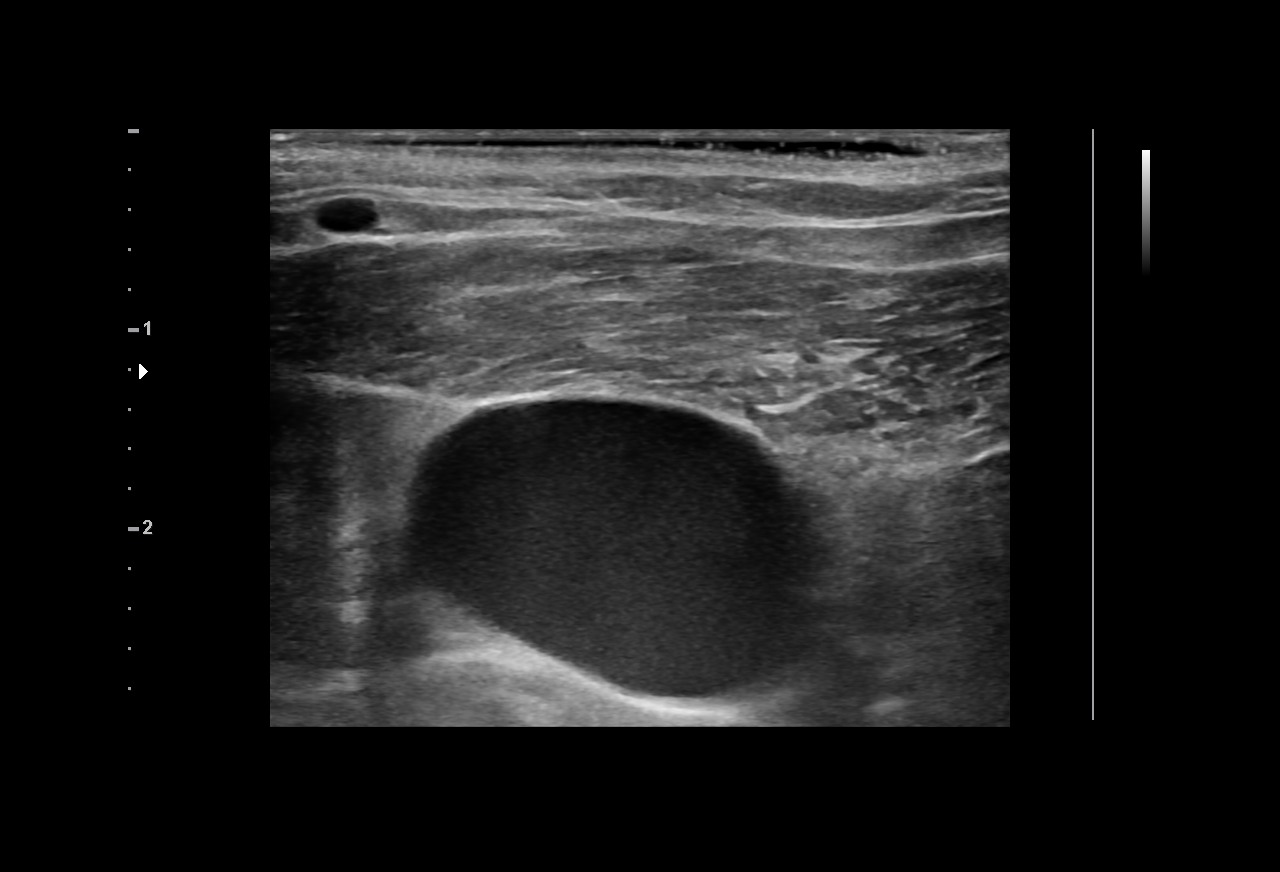

[Series 1: fl (-) angio · 2 of 2 slices shown]
[im 1/2]
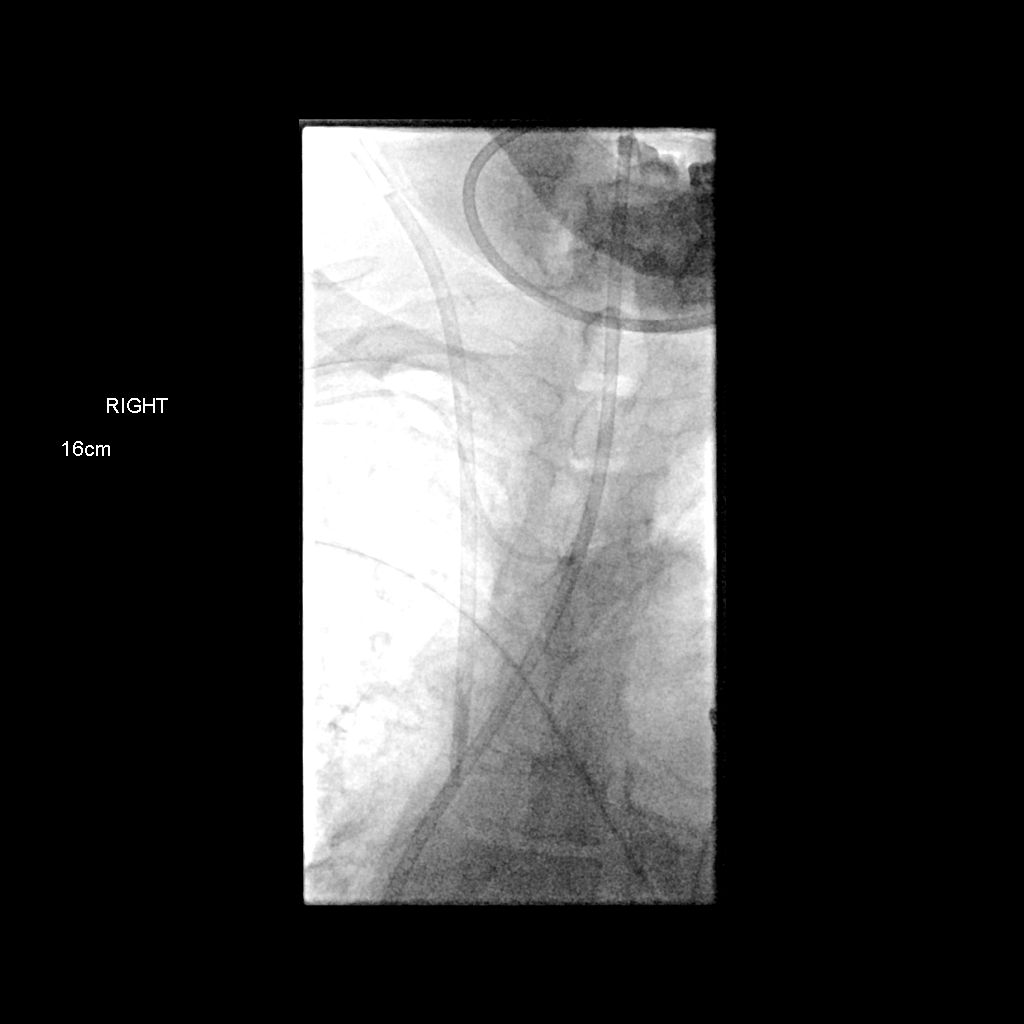
[im 2/2]
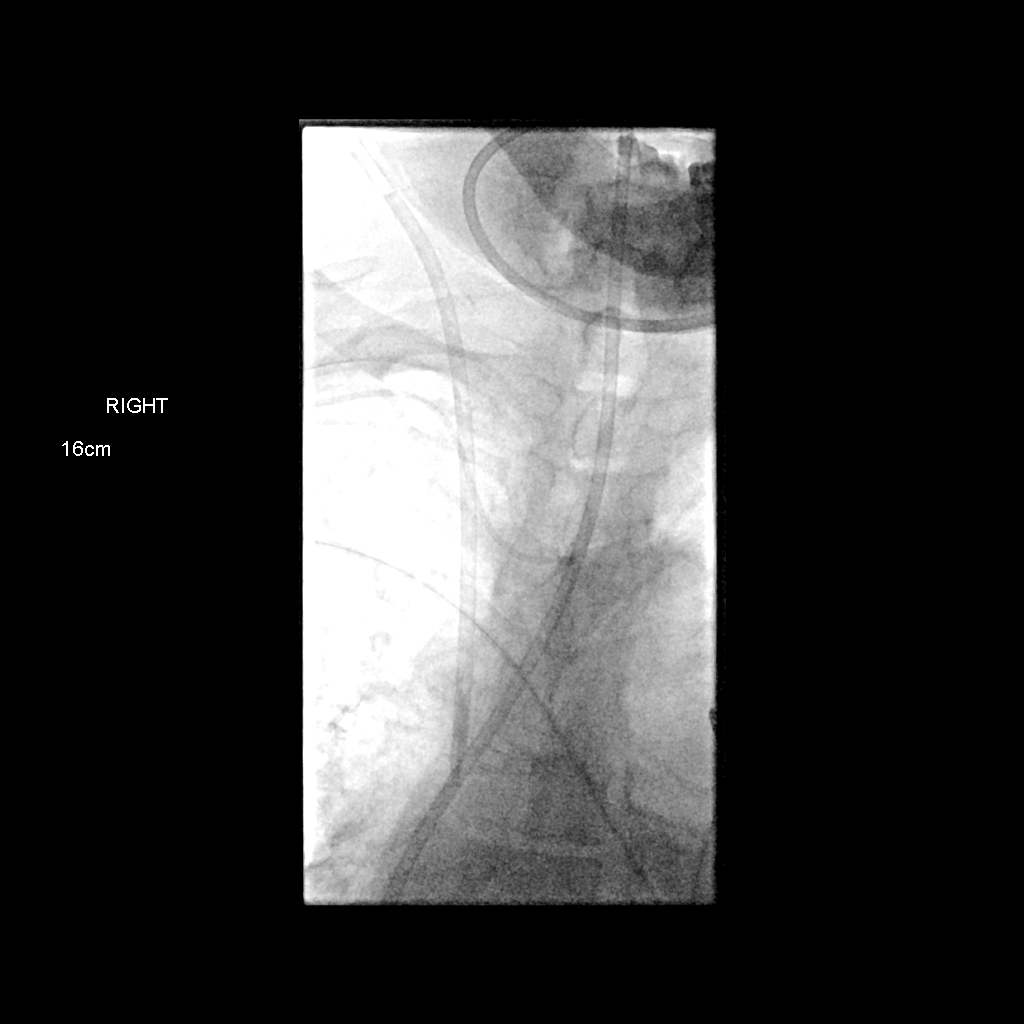

[3 of 3 positions shown; findings below may reference images not displayed]

EXAM:
FLUOROSCOPIC AND ULTRASOUND GUIDED PLACEMENT OF A NON-TUNNELED
DIALYSIS CATHETER

MEDICATIONS:
None

ANESTHESIA/SEDATION:
None

FLUOROSCOPY TIME:  Fluoroscopy Time: 48 seconds, 3 mGy

COMPLICATIONS:
None immediate.

PROCEDURE:
Informed consent was obtained for catheter placement. The patient
was placed supine on the interventional table. Ultrasound confirmed
a patent right internal jugular vein. Ultrasound images were
obtained for documentation. The right neck was prepped and draped in
a sterile fashion. The right neck was anesthetized with 1%
lidocaine. Maximal barrier sterile technique was utilized including
caps, mask, sterile gowns, sterile gloves, sterile drape, hand
hygiene and skin antiseptic. A small incision was made with #11
blade scalpel. Needle was directed into the right internal jugular
vein using ultrasound guidance. Wire was preferentially going into
the right subclavian vein. Therefore, a 5 [place] Kumpe catheter was
advanced over the wire and directed into the SVC. Kumpe catheter was
removed. A 16 cm Mahurkar catheter was selected. The catheter was
advanced over a wire and positioned at the superior cavoatrial
junction. Fluoroscopic images were obtained for documentation. Both
dialysis lumens were found to aspirate and flush well. The proper
amount of heparin was flushed in both lumens. The central venous
lumen was flushed with normal saline. Catheter was sutured to skin.
FINDINGS: Catheter tip at the superior cavoatrial junction.
IMPRESSION: Successful placement of a right jugular non-tunneled dialysis
catheter using ultrasound and fluoroscopic guidance.

## 2020-04-17 IMAGING — DX DG CHEST 1V PORT
1 series · 2 of 2 positions shown · non-contrast
Comparison: [DATE]

CLINICAL DATA: History of COVID positive with low oxygen
saturation.

EXAM:
PORTABLE CHEST 1 VIEW

[Series 1: chest · 0.14mm/px · 2 of 2 slices shown]
[im 1/2]
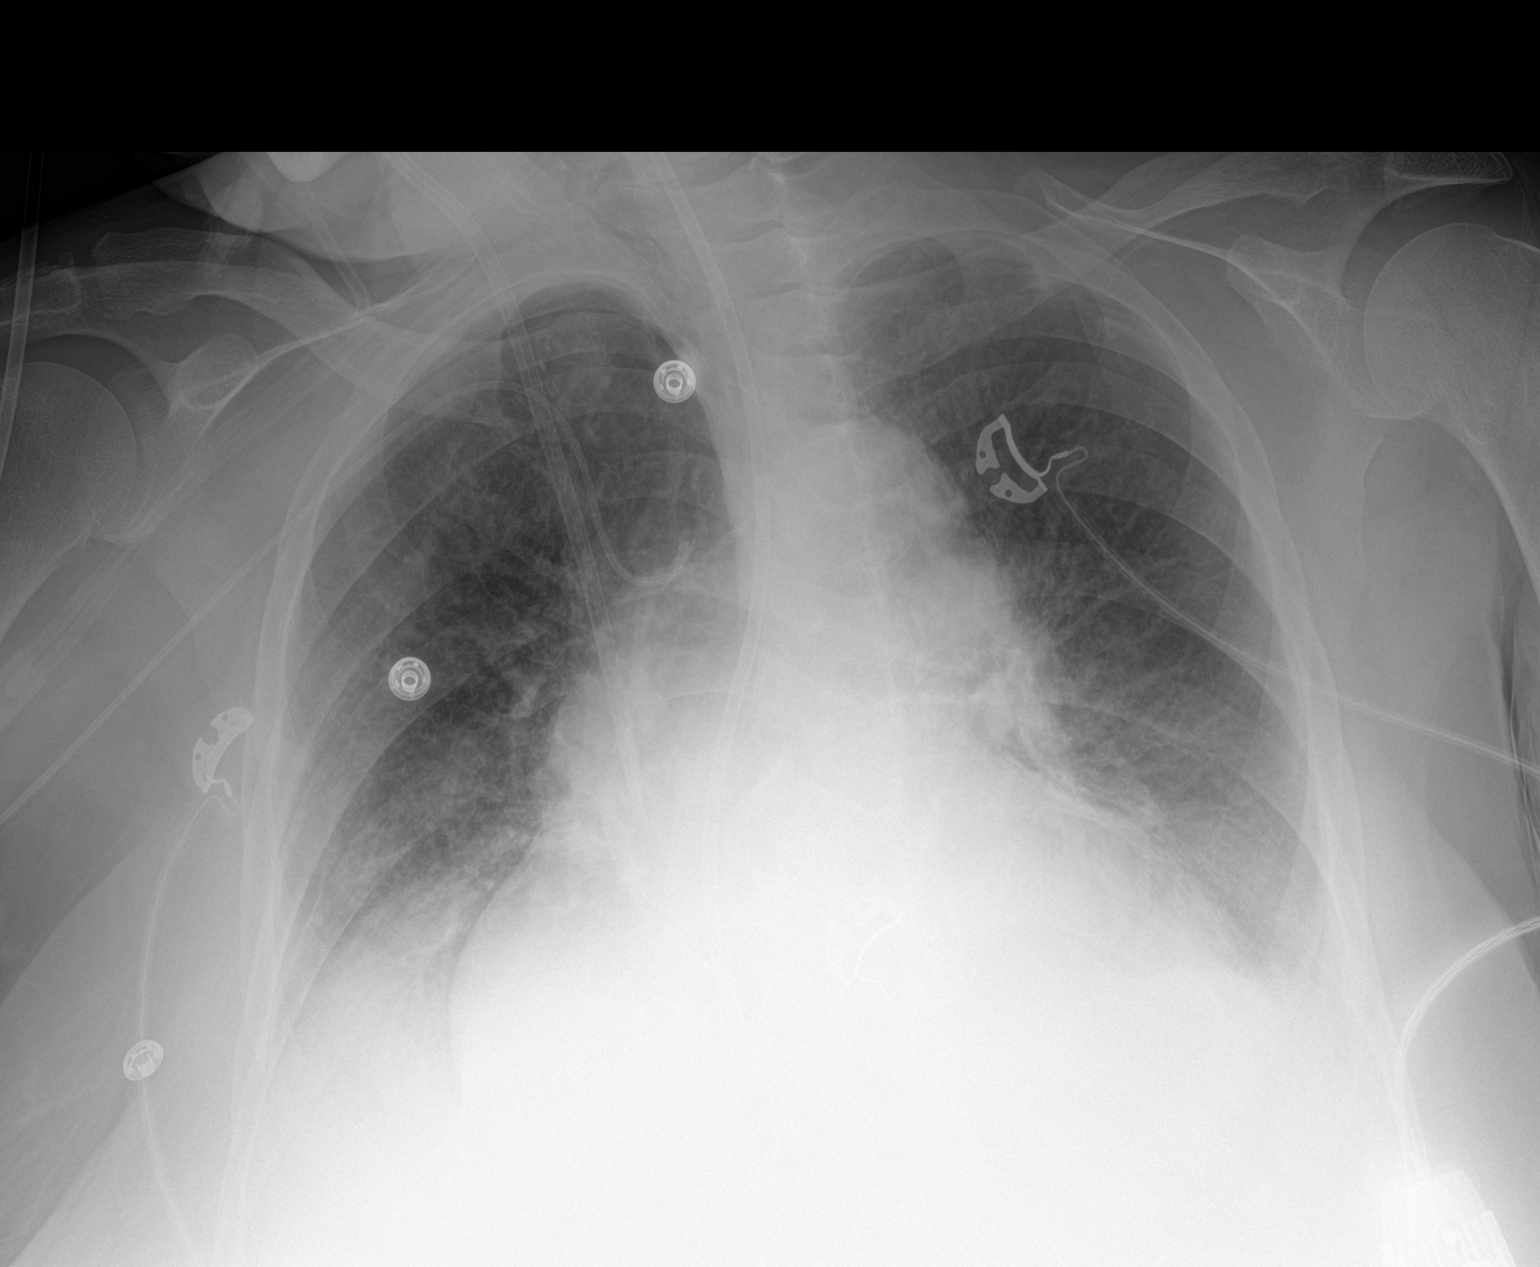
[im 2/2]
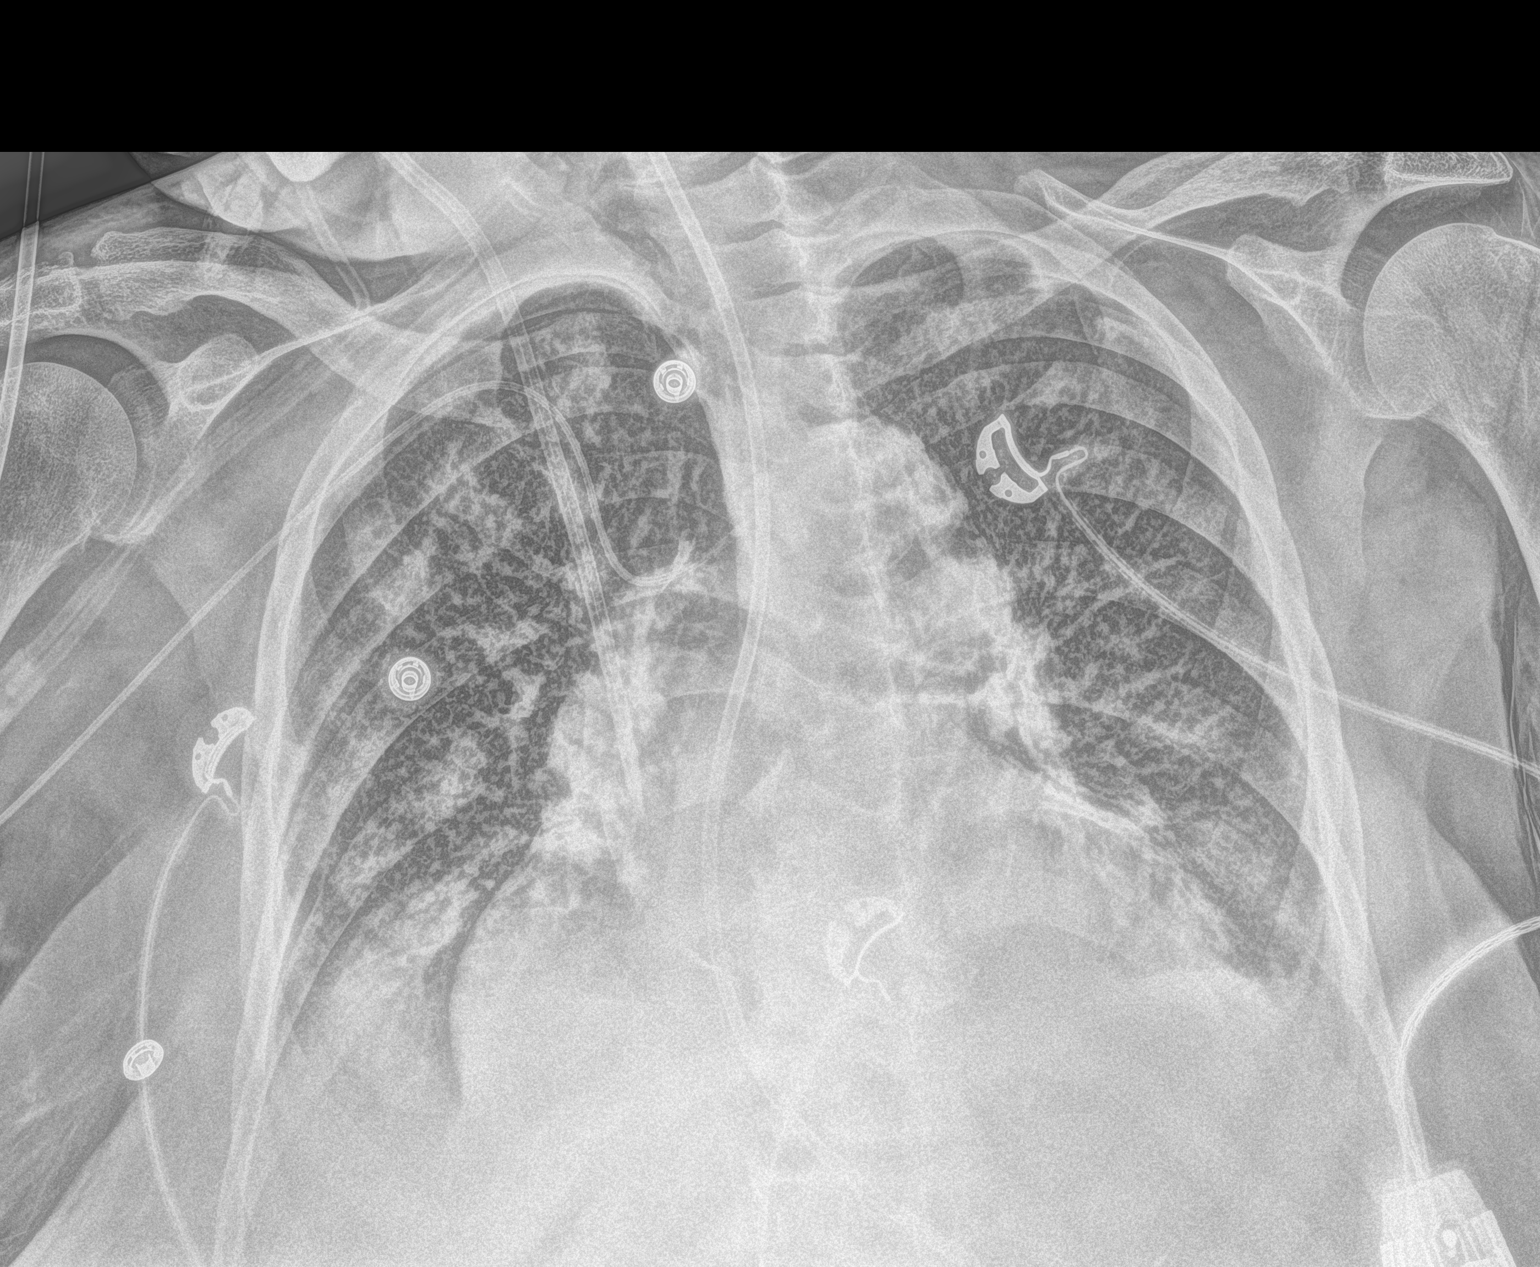

[2 of 2 positions shown; findings below may reference images not displayed]

FINDINGS: The study is limited secondary to patient rotation. A right-sided
PICC line is seen with its distal end mildly curved upward within
the distal superior vena cava. A right internal jugular venous
catheter is noted, with its distal end overlying the junction of the
superior vena cava and right atrium. Interval nasogastric feeding
tube placement is noted with its distal end overlying the body of
the stomach. Mildly hazy appearing bilateral lung bases are seen
which is likely positional in nature. There is no definite evidence
of acute infiltrate, pleural effusion or pneumothorax. The cardiac
silhouette is markedly enlarged and unchanged in size. The
visualized skeletal structures are unremarkable.
IMPRESSION: 1. Interval right internal jugular venous catheter and nasogastric
feeding tube placement and positioning, as described above.
2. Right-sided PICC line with its distal end mildly curved upward
within the distal superior vena cava.
3. Cardiomegaly with suspected positional hazy appearing bilateral
lung bases. Mild bibasilar airspace disease cannot be excluded.

## 2020-04-17 MED ORDER — LIDOCAINE HCL 1 % IJ SOLN
INTRAMUSCULAR | Status: AC
  Filled 2020-04-17: qty 20

## 2020-04-17 MED ORDER — LIDOCAINE HCL 1 % IJ SOLN
INTRAMUSCULAR | Status: DC | PRN
  Administered 2020-04-17: 10 mL

## 2020-04-17 MED ORDER — HEPARIN SODIUM (PORCINE) 1000 UNIT/ML IJ SOLN
INTRAMUSCULAR | Status: AC
  Filled 2020-04-17: qty 1

## 2020-04-17 MED ORDER — CHLORHEXIDINE GLUCONATE CLOTH 2 % EX PADS
6.0000 | MEDICATED_PAD | Freq: Every day | CUTANEOUS | Status: DC
  Administered 2020-04-18 – 2020-04-23 (×6): 6 via TOPICAL

## 2020-04-17 NOTE — Significant Event (Signed)
Rapid response was called this afternoon after the nurse noted patient to be breathing abnormally. I attended the patient immediately. Chart reviewed. In brief, 75 year old female with recent admission for COVID-pneumonia discharged in January 3 per family's request to home, brought back on January 6 for worsening confusion, poor oral intake and overall decline. She has had extensive evaluation to identify the cause of her altered mental status without success. EEG normal.  CT head normal.  Repeated blood gas studies have not shown any evidence of hypercapnia. Ammonia level only mildly elevated on repeated checking, last 1 on 1/12. Patient has been seen by nephrology, and palliative care as well. Palliative care note reviewed as well. Family has been willing to try aggressive measures. Patient underwent dialysis catheter placement today and is planned for dialysis tomorrow to see if resolution of uremia would help improve her mental status.  I do not think she has significantly elevated urea level either.  At the time of my evaluation this afternoon, patient was obtained.  She will try to open eyes on deep sternal rub at the best. Vital signs stable. Afebrile, heart rate 100, blood pressure more than 063 systolic, oxygen saturation 98% on 4 L by nasal cannula. She has tube feeding ongoing.  Last blood sugar check was 130, right before rapid response was called. Family not at bedside.  Discussed with bedside nurse Miranda and rapid response nurse Saralyn Pilar. At this time, I am not sure if patient's status is any different than how she has been for last several days. However, with her persistent altered mental status and ongoing tube feeding, there is always a risk of aspiration event that could lead to sudden deterioration of respiratory status. For now, we can at least obtain a chest x-ray to rule out aspiration and a blood gas to rule out hypercapnia.  Chest x-ray does not show any acute  infiltrate, effusion or pneumothorax. Blood gas normal with pH of 7.44, PCO2 35, bicarb 23. Since there is no evidence of aspiration, I would not hold tube feeding. Continue to monitor for now.  Will follow along. Discussed with primary attending Dr. Raelyn Mora.

## 2020-04-17 NOTE — Progress Notes (Signed)
Patient breathing changed more shallow with some apnea. Lung sounds rhonchi. Patient only responding to painful stimuli, MD notified and Rapid nurse  also called and at bedside assessing patient.   Trayven Lumadue, Tivis Ringer, RN

## 2020-04-17 NOTE — Progress Notes (Signed)
[facility] Progress Note    Assessment/ Plan:   1. CKD5, ESRD -Cr stable this am, BUN slightly up. No improvement in mental status unfortunately. No urgent indications for renal replacement therapy -updated daughter [name] over the phone yesterday on 1/15 -appreciate palliative care's assistance, see note -will go ahead and start HD given that there has been zero improvement in her mental status. Uremia is a variable that will be addressed via renal replacement therapy -she is not a long term dialysis candidate given her multiple comborbidities and functional status (lack thereof). Therefore, will set her up for a temporary catheter (NO permanent access for now) via IR (appreciate assistance)-possibly today or tomorrow -will plan for only 3 dialysis treatments (slow start protocol). Tentatively, will start Monday and will do HD again on Tues and Wed. If there is no improvement in mental status, then I recommend stopping dialysis altogether and removing the catheter and having a realistic conversation with the family. If there any issues with this process, then I would recommend having a very low threshold to consult ethics. If needed, can offer a second opinion however that would entail her needing to be transferred to another facility -Daughter [name]) is in agreement with this plan and is appreciative as per our discussion on 1/15 -Per office progress note/documentation, patient herself refused to have dialysis done per the conversations between her and Dr. Hollie Salk (primary nephrologist). Family has rescinded all of patient's decisions, now full code -Avoid nephrotoxic medications including NSAIDs and iodinated intravenous contrast exposure unless the latter is absolutely indicated.  Preferred narcotic agents for pain control are hydromorphone, fentanyl, and methadone. Morphine should not be used. Avoid Baclofen and avoid oral sodium phosphate and magnesium citrate based laxatives /  bowel preps. Continue strict Input and Output monitoring. Will monitor the patient closely with you and intervene or adjust therapy as indicated by changes in clinical status/labs   2. Acute metabolic encephalopathy -pall care on board. Mgmt per primary service -see above  3. Aspiration PNA -unasyn, mgmt per primary service  4. Recent COVID, 03/30/20  5. Metabolic acidosis, resolved 6. HTN/volume -slightly volume up but dependent edema, chronic stasis changes bilateral LE's -VSS 7. Anemia -starting iron, 250mg  qweekly   Subjective:   No acute events. uop not charted   Objective:   BP 112/63 (BP Location: Left Arm)   Pulse 100   Temp 98.4 F (36.9 C) (Axillary)   Resp 20   Ht 5\' 8"  (1.727 m)   Wt 99.1 kg   SpO2 99%   BMI 33.22 kg/m   Intake/Output Summary (Last 24 hours) at 04/17/2020 1121 Last data filed at 04/17/2020 1601 Gross per 24 hour  Intake 1570 ml  Output --  Net 1570 ml   Weight change:   Physical Exam: Gen: chronically ill appearing CVS:reg rate Resp:normal wob UXN:ATFTD, soft Ext:+dependent edema Neuro: nonverbal, unresponsive  Imaging: IR Fluoro Guide CV Line Right  Result Date: 04/17/2020 INDICATION: 75 year old with renal failure. Request for non tunneled dialysis catheter. EXAM: FLUOROSCOPIC AND ULTRASOUND GUIDED PLACEMENT OF A NON-TUNNELED DIALYSIS CATHETER Physician: Stephan Minister. Henn, MD MEDICATIONS: None ANESTHESIA/SEDATION: None FLUOROSCOPY TIME:  Fluoroscopy Time: 48 seconds, 3 mGy COMPLICATIONS: None immediate. PROCEDURE: Informed consent was obtained for catheter placement. The patient was placed supine on the interventional table. Ultrasound confirmed a patent right internal jugular vein. Ultrasound images were obtained for documentation. The right neck was prepped and draped in a sterile fashion. The right neck was anesthetized with 1% lidocaine. Maximal  barrier sterile technique was utilized including caps, mask, sterile gowns, sterile gloves,  sterile drape, hand hygiene and skin antiseptic. A small incision was made with #11 blade scalpel. Needle was directed into the right internal jugular vein using ultrasound guidance. Wire was preferentially going into the right subclavian vein. Therefore, a 5 [place] Kumpe catheter was advanced over the wire and directed into the SVC. Kumpe catheter was removed. A 16 cm Mahurkar catheter was selected. The catheter was advanced over a wire and positioned at the superior cavoatrial junction. Fluoroscopic images were obtained for documentation. Both dialysis lumens were found to aspirate and flush well. The proper amount of heparin was flushed in both lumens. The central venous lumen was flushed with normal saline. Catheter was sutured to skin. FINDINGS: Catheter tip at the superior cavoatrial junction. IMPRESSION: Successful placement of a right jugular non-tunneled dialysis catheter using ultrasound and fluoroscopic guidance. Electronically Signed   By: Markus Daft M.D.   On: 04/17/2020 09:01   IR US Guide Vasc Access Right  Result Date: 04/17/2020 INDICATION: 75 year old with renal failure. Request for non tunneled dialysis catheter. EXAM: FLUOROSCOPIC AND ULTRASOUND GUIDED PLACEMENT OF A NON-TUNNELED DIALYSIS CATHETER Physician: Stephan Minister. Henn, MD MEDICATIONS: None ANESTHESIA/SEDATION: None FLUOROSCOPY TIME:  Fluoroscopy Time: 48 seconds, 3 mGy COMPLICATIONS: None immediate. PROCEDURE: Informed consent was obtained for catheter placement. The patient was placed supine on the interventional table. Ultrasound confirmed a patent right internal jugular vein. Ultrasound images were obtained for documentation. The right neck was prepped and draped in a sterile fashion. The right neck was anesthetized with 1% lidocaine. Maximal barrier sterile technique was utilized including caps, mask, sterile gowns, sterile gloves, sterile drape, hand hygiene and skin antiseptic. A small incision was made with #11 blade scalpel. Needle  was directed into the right internal jugular vein using ultrasound guidance. Wire was preferentially going into the right subclavian vein. Therefore, a 5 [place] Kumpe catheter was advanced over the wire and directed into the SVC. Kumpe catheter was removed. A 16 cm Mahurkar catheter was selected. The catheter was advanced over a wire and positioned at the superior cavoatrial junction. Fluoroscopic images were obtained for documentation. Both dialysis lumens were found to aspirate and flush well. The proper amount of heparin was flushed in both lumens. The central venous lumen was flushed with normal saline. Catheter was sutured to skin. FINDINGS: Catheter tip at the superior cavoatrial junction. IMPRESSION: Successful placement of a right jugular non-tunneled dialysis catheter using ultrasound and fluoroscopic guidance. Electronically Signed   By: Markus Daft M.D.   On: 04/17/2020 09:01    Labs: BMET Recent Labs  Lab 04/11/20 0500 04/12/20 0512 04/13/20 0452 04/14/20 0358 04/15/20 0543 04/16/20 0439 04/17/20 0618  NA 146* 142 139 142 141 139 139  K 3.0* 3.3* 2.7* 3.8 3.4* 3.8 4.3  CL 111 105 99 103 105 103 104  CO2 21* 23 27 24 22 24 23   GLUCOSE 118* 112* 290* 82 134* 122* 117*  BUN 65* 63* 55* 63* 68* 71* 76*  CREATININE 5.13* 5.31* 5.12* 5.78* 5.85* 5.85* 5.73*  CALCIUM 7.9* 7.7* 7.1* 8.0* 7.7* 7.5* 7.5*   CBC Recent Labs  Lab 04/13/20 0452 04/15/20 0543 04/16/20 0439 04/17/20 0618  WBC 13.0* 14.4* 12.8* 12.6*  NEUTROABS  --   --   --  9.2*  HGB 7.4* 7.9* 7.7* 7.6*  HCT 23.1* 24.5* 23.8* 24.7*  MCV 91.3 92.1 93.3 95.0  PLT 158 154 163 169    Medications:    .  Chlorhexidine Gluconate Cloth  6 each Topical Daily  . [START ON 04/21/2020] darbepoetin (ARANESP) injection - NON-DIALYSIS  200 mcg Subcutaneous Q Thu-1800  . feeding supplement (PROSource TF)  45 mL Per Tube Daily  . free water  150 mL Per Tube Q6H  . heparin  5,000 Units Subcutaneous Q8H  . heparin sodium  (porcine)      . lidocaine      . metoprolol tartrate  100 mg Per Tube BID  . sodium chloride flush  10-40 mL Intracatheter Q12H      Gean Quint, MD [facility] 04/17/2020, 11:21 AM

## 2020-04-17 NOTE — Progress Notes (Signed)
PROGRESS NOTE    Stephanie Griffith  WNI:[id number] DOB: 02-Sep-1945 DOA: 04/10/2020 PCP: [name], MD    Brief Narrative:  75 year old female with history of chronic diastolic heart failure, chronic kidney disease stage V, history of COPD, hypertension hyperlipidemia, paroxysmal A. fib and pulmonary hypertension with vascular dementia who was recently hospitalized for UTI, COVID-19 pneumonia and had further deterioration of her mental status.  She was discharged home on January 3 on family request.  Came back to emergency room on January 6 with worsening confusion, not eating. Admitted with metabolic encephalopathy.  Treated for aspiration pneumonia.  Remains persistently encephalopathic. Reportedly patient had declined to have hemodialysis at outpatient follow-ups. 1/13, daughters rescinded DNR, decided to pursue with all treatments.  Tube feeding started. 1/16, temporary HD catheter placed to plan for trial of dialysis.   Assessment & Plan:   Principal Problem:   Acute metabolic encephalopathy Active Problems:   Normocytic anemia   Leukocytosis   PAD (peripheral artery disease) (HCC)   S/P AKA (above knee amputation) (HCC)   Chronic renal insufficiency, stage IV (severe) (HCC)   Metabolic acidosis   Chronic diastolic CHF (congestive heart failure) (HCC)   Encephalopathy due to COVID-19 virus   Dyslipidemia   Acute encephalopathy   Atrial fibrillation with RVR ([place])   Advanced care planning/counseling discussion   Goals of care, counseling/discussion  Acute metabolic encephalopathy, profound encephalopathy with underlying vascular dementia.  Probably exacerbated by recent UTI and COVID-19 infection. Currently on symptomatic treatment.  Remains persistently encephalopathic. Dobbhoff tube feeding, tolerating well. Oxygenation is stable. Palliative care consulted, family decided to go aggressive treatment. EEG showed triphasic morphology, started on Keppra that was stopped because  of mentation. Uremia might have added to her overall encephalopathy. Patient not a permanent dialysis candidate, however family want to see if she responds to temporary dialysis. If patient responds to temporary dialysis, will pursue rehab. If patient does not respond to temporary dialysis, goal of care discussion will be revisited. Received right IJ catheter today.  Suspected aspiration pneumonia: Completed 5 days of antibiotics.  Rapid A. fib: Rate controlled.  Rate controlled on metoprolol, and as needed metoprolol.  Not an anticoagulation candidate with altered mental status.  Hypokalemia/hypomagnesemia: Replaced.  CKD stage V: As above.  Creatinine continues to worsen.  Mental status continues to worsen. Getting IV iron today. As above, planning to start on temporary dialysis.   DVT prophylaxis: heparin injection 5,000 Units Start: 04/08/20 0730   Code Status: Full code Family Communication: Patient's daughter [name] at the bedside 1/15. Called but unable to talk to her today.  Disposition Plan: Status is: Inpatient  Remains inpatient appropriate because:Inpatient level of care appropriate due to severity of illness   Dispo: The patient is from: Home              Anticipated d/c is to: SNF vs LTAC              Anticipated d/c date is: > 3 days              Patient currently is not medically stable to d/c.         Consultants:   Nephrology  Palliative medicine  Procedures:   None  Antimicrobials:   Completed   Subjective: Patient seen and examined.  She is totally unresponsive.  does not follow commands. No family at bedside. Came back from IR, no sedation given but obtunded.  Objective: Vitals:   04/17/20 0005 04/17/20 0410 04/17/20 0500  04/17/20 1052  BP: 105/69 112/63  111/73  Pulse: 97 100  (!) 102  Resp: (!) 24 20  20   Temp: 97.9 F (36.6 C) 98.4 F (36.9 C)  98.3 F (36.8 C)  TempSrc: Oral Axillary  Oral  SpO2: 97% 99%  100%  Weight:    99.1 kg   Height:        Intake/Output Summary (Last 24 hours) at 04/17/2020 1250 Last data filed at 04/17/2020 0651 Gross per 24 hour  Intake 1570 ml  Output --  Net 1570 ml   Filed Weights   04/25/2020 1632 04/17/20 0500  Weight: 89.8 kg 99.1 kg    Examination:  General exam: Sick looking. obtunded and unresponsive. Respiratory system: Clear to auscultation. Respiratory effort normal.  Some conducted airway sounds. Cardiovascular system: S1 & S2 heard, irregularly irregular. 1+ pedal edema both legs.  Pedal edema left hand. Gastrointestinal system: Soft.  Nontender.  Bowel sounds present.   Central nervous system: Lethargic.  Unresponding.  Does not follow any commands.    Data Reviewed: I have personally reviewed following labs and imaging studies  CBC: Recent Labs  Lab 04/12/20 0512 04/13/20 0452 04/15/20 0543 04/16/20 0439 04/17/20 0618  WBC 11.2* 13.0* 14.4* 12.8* 12.6*  NEUTROABS  --   --   --   --  9.2*  HGB 8.5* 7.4* 7.9* 7.7* 7.6*  HCT 27.3* 23.1* 24.5* 23.8* 24.7*  MCV 92.2 91.3 92.1 93.3 95.0  PLT 141* 158 154 163 371   Basic Metabolic Panel: Recent Labs  Lab 04/13/20 0452 04/13/20 0736 04/14/20 0358 04/15/20 0543 04/16/20 0439 04/17/20 0618  NA 139  --  142 141 139 139  K 2.7*  --  3.8 3.4* 3.8 4.3  CL 99  --  103 105 103 104  CO2 27  --  24 22 24 23   GLUCOSE 290*  --  82 134* 122* 117*  BUN 55*  --  63* 68* 71* 76*  CREATININE 5.12*  --  5.78* 5.85* 5.85* 5.73*  CALCIUM 7.1*  --  8.0* 7.7* 7.5* 7.5*  MG  --  1.6*  --   --  2.3  --    GFR: Estimated Creatinine Clearance: 10.6 mL/min (A) (by C-G formula based on SCr of 5.73 mg/dL (H)). Liver Function Tests: No results for input(s): AST, ALT, ALKPHOS, BILITOT, PROT, ALBUMIN in the last 168 hours. No results for input(s): LIPASE, AMYLASE in the last 168 hours. Recent Labs  Lab 04/11/20 0500 04/13/20 0454  AMMONIA 58* 46*   Coagulation Profile: No results for input(s): INR, PROTIME in  the last 168 hours. Cardiac Enzymes: No results for input(s): CKTOTAL, CKMB, CKMBINDEX, TROPONINI in the last 168 hours. BNP (last 3 results) No results for input(s): PROBNP in the last 8760 hours. HbA1C: No results for input(s): HGBA1C in the last 72 hours. CBG: Recent Labs  Lab 04/16/20 1700 04/16/20 2057 04/17/20 0029 04/17/20 0433 04/17/20 1154  GLUCAP 115* 113* 117* 116* 139*   Lipid Profile: No results for input(s): CHOL, HDL, LDLCALC, TRIG, CHOLHDL, LDLDIRECT in the last 72 hours. Thyroid Function Tests: No results for input(s): TSH, T4TOTAL, FREET4, T3FREE, THYROIDAB in the last 72 hours. Anemia Panel: Recent Labs    04/15/20 0543  FERRITIN 120  TIBC 132*  IRON 18*   Sepsis Labs: No results for input(s): PROCALCITON, LATICACIDVEN in the last 168 hours.  Recent Results (from the past 240 hour(s))  Culture, blood (single)     Status: None  Collection Time: 04/18/2020  1:27 PM   Specimen: BLOOD LEFT FOREARM  Result Value Ref Range Status   Specimen Description BLOOD LEFT FOREARM  Final   Special Requests   Final    BOTTLES DRAWN AEROBIC AND ANAEROBIC Blood Culture results may not be optimal due to an inadequate volume of blood received in culture bottles   Culture   Final    NO GROWTH 5 DAYS Performed at [facility], [place] [address], [place], [place] [address]    Report Status 04/12/2020 FINAL  Final  Urine culture     Status: None   Collection Time: 04/15/2020  3:02 PM   Specimen: Urine, Random  Result Value Ref Range Status   Specimen Description URINE, RANDOM  Final   Special Requests NONE  Final   Culture   Final    NO GROWTH Performed at [facility], [place] [address], [place], [place] [address]    Report Status 04/08/2020 FINAL  Final         Radiology Studies: IR Fluoro Guide CV Line Right  Result Date: 04/17/2020 INDICATION: 75 year old with renal failure. Request for non tunneled dialysis catheter. EXAM: FLUOROSCOPIC AND ULTRASOUND  GUIDED PLACEMENT OF A NON-TUNNELED DIALYSIS CATHETER Physician: Stephan Minister. Henn, MD MEDICATIONS: None ANESTHESIA/SEDATION: None FLUOROSCOPY TIME:  Fluoroscopy Time: 48 seconds, 3 mGy COMPLICATIONS: None immediate. PROCEDURE: Informed consent was obtained for catheter placement. The patient was placed supine on the interventional table. Ultrasound confirmed a patent right internal jugular vein. Ultrasound images were obtained for documentation. The right neck was prepped and draped in a sterile fashion. The right neck was anesthetized with 1% lidocaine. Maximal barrier sterile technique was utilized including caps, mask, sterile gowns, sterile gloves, sterile drape, hand hygiene and skin antiseptic. A small incision was made with #11 blade scalpel. Needle was directed into the right internal jugular vein using ultrasound guidance. Wire was preferentially going into the right subclavian vein. Therefore, a 5 [place] Kumpe catheter was advanced over the wire and directed into the SVC. Kumpe catheter was removed. A 16 cm Mahurkar catheter was selected. The catheter was advanced over a wire and positioned at the superior cavoatrial junction. Fluoroscopic images were obtained for documentation. Both dialysis lumens were found to aspirate and flush well. The proper amount of heparin was flushed in both lumens. The central venous lumen was flushed with normal saline. Catheter was sutured to skin. FINDINGS: Catheter tip at the superior cavoatrial junction. IMPRESSION: Successful placement of a right jugular non-tunneled dialysis catheter using ultrasound and fluoroscopic guidance. Electronically Signed   By: Markus Daft M.D.   On: 04/17/2020 09:01   IR US Guide Vasc Access Right  Result Date: 04/17/2020 INDICATION: 75 year old with renal failure. Request for non tunneled dialysis catheter. EXAM: FLUOROSCOPIC AND ULTRASOUND GUIDED PLACEMENT OF A NON-TUNNELED DIALYSIS CATHETER Physician: Stephan Minister. Henn, MD MEDICATIONS: None  ANESTHESIA/SEDATION: None FLUOROSCOPY TIME:  Fluoroscopy Time: 48 seconds, 3 mGy COMPLICATIONS: None immediate. PROCEDURE: Informed consent was obtained for catheter placement. The patient was placed supine on the interventional table. Ultrasound confirmed a patent right internal jugular vein. Ultrasound images were obtained for documentation. The right neck was prepped and draped in a sterile fashion. The right neck was anesthetized with 1% lidocaine. Maximal barrier sterile technique was utilized including caps, mask, sterile gowns, sterile gloves, sterile drape, hand hygiene and skin antiseptic. A small incision was made with #11 blade scalpel. Needle was directed into the right internal jugular vein using ultrasound guidance.  Wire was preferentially going into the right subclavian vein. Therefore, a 5 [place] Kumpe catheter was advanced over the wire and directed into the SVC. Kumpe catheter was removed. A 16 cm Mahurkar catheter was selected. The catheter was advanced over a wire and positioned at the superior cavoatrial junction. Fluoroscopic images were obtained for documentation. Both dialysis lumens were found to aspirate and flush well. The proper amount of heparin was flushed in both lumens. The central venous lumen was flushed with normal saline. Catheter was sutured to skin. FINDINGS: Catheter tip at the superior cavoatrial junction. IMPRESSION: Successful placement of a right jugular non-tunneled dialysis catheter using ultrasound and fluoroscopic guidance. Electronically Signed   By: Markus Daft M.D.   On: 04/17/2020 09:01        Scheduled Meds: . Chlorhexidine Gluconate Cloth  6 each Topical Daily  . [START ON 04/18/2020] Chlorhexidine Gluconate Cloth  6 each Topical Q0600  . [START ON 04/21/2020] darbepoetin (ARANESP) injection - NON-DIALYSIS  200 mcg Subcutaneous Q Thu-1800  . feeding supplement (PROSource TF)  45 mL Per Tube Daily  . free water  150 mL Per Tube Q6H  . heparin  5,000 Units  Subcutaneous Q8H  . heparin sodium (porcine)      . lidocaine      . metoprolol tartrate  100 mg Per Tube BID  . sodium chloride flush  10-40 mL Intracatheter Q12H   Continuous Infusions: . feeding supplement (OSMOLITE 1.2 CAL) 1,000 mL (04/17/20 0949)  . ferric gluconate (FERRLECIT/NULECIT) IV Stopped (04/15/20 1922)     LOS: 9 days    Time spent: 30 minutes    Barb Merino, MD [facility] Pager [phone]

## 2020-04-18 DIAGNOSIS — G9341 Metabolic encephalopathy: Secondary | ICD-10-CM | POA: Diagnosis not present

## 2020-04-18 LAB — BASIC METABOLIC PANEL
Anion gap: 13 (ref 5–15)
BUN: 84 mg/dL — ABNORMAL HIGH (ref 8–23)
CO2: 21 mmol/L — ABNORMAL LOW (ref 22–32)
Calcium: 7.8 mg/dL — ABNORMAL LOW (ref 8.9–10.3)
Chloride: 103 mmol/L (ref 98–111)
Creatinine, Ser: 5.78 mg/dL — ABNORMAL HIGH (ref 0.44–1.00)
GFR, Estimated: 7 mL/min — ABNORMAL LOW (ref >60)
Glucose, Bld: 135 mg/dL — ABNORMAL HIGH (ref 70–99)
Potassium: 4.8 mmol/L (ref 3.5–5.1)
Sodium: 137 mmol/L (ref 135–145)

## 2020-04-18 LAB — GLUCOSE, CAPILLARY
Glucose-Capillary: 119 mg/dL — ABNORMAL HIGH (ref 70–99)
Glucose-Capillary: 137 mg/dL — ABNORMAL HIGH (ref 70–99)
Glucose-Capillary: 144 mg/dL — ABNORMAL HIGH (ref 70–99)

## 2020-04-18 LAB — CBC WITH DIFFERENTIAL/PLATELET
Abs Immature Granulocytes: 0.53 10*3/uL — ABNORMAL HIGH (ref 0.00–0.07)
Basophils Absolute: 0 10*3/uL (ref 0.0–0.1)
Basophils Relative: 0 %
Eosinophils Absolute: 0.3 10*3/uL (ref 0.0–0.5)
Eosinophils Relative: 3 %
HCT: 24.6 % — ABNORMAL LOW (ref 36.0–46.0)
Hemoglobin: 7.5 g/dL — ABNORMAL LOW (ref 12.0–15.0)
Immature Granulocytes: 4 %
Lymphocytes Relative: 10 %
Lymphs Abs: 1.3 10*3/uL (ref 0.7–4.0)
MCH: 29.2 pg (ref 26.0–34.0)
MCHC: 30.5 g/dL (ref 30.0–36.0)
MCV: 95.7 fL (ref 80.0–100.0)
Monocytes Absolute: 1.4 10*3/uL — ABNORMAL HIGH (ref 0.1–1.0)
Monocytes Relative: 11 %
Neutro Abs: 8.8 10*3/uL — ABNORMAL HIGH (ref 1.7–7.7)
Neutrophils Relative %: 72 %
Platelets: 214 10*3/uL (ref 150–400)
RBC: 2.57 MIL/uL — ABNORMAL LOW (ref 3.87–5.11)
RDW: 18.6 % — ABNORMAL HIGH (ref 11.5–15.5)
WBC: 12.3 10*3/uL — ABNORMAL HIGH (ref 4.0–10.5)
nRBC: 1.3 % — ABNORMAL HIGH (ref 0.0–0.2)

## 2020-04-18 LAB — HEPATIC FUNCTION PANEL
ALT: 19 U/L (ref 0–44)
AST: 33 U/L (ref 15–41)
Albumin: 1.7 g/dL — ABNORMAL LOW (ref 3.5–5.0)
Alkaline Phosphatase: 108 U/L (ref 38–126)
Bilirubin, Direct: 0.1 mg/dL (ref 0.0–0.2)
Indirect Bilirubin: 0.6 mg/dL (ref 0.3–0.9)
Total Bilirubin: 0.7 mg/dL (ref 0.3–1.2)
Total Protein: 5.6 g/dL — ABNORMAL LOW (ref 6.5–8.1)

## 2020-04-18 LAB — AMMONIA: Ammonia: 118 umol/L — ABNORMAL HIGH (ref 9–35)

## 2020-04-18 MED ORDER — DILTIAZEM 12 MG/ML ORAL SUSPENSION
60.0000 mg | Freq: Three times a day (TID) | ORAL | Status: DC
Start: 2020-04-18 — End: 2020-04-21
  Administered 2020-04-18 – 2020-04-21 (×9): 60 mg via ORAL
  Filled 2020-04-18 (×11): qty 6

## 2020-04-18 MED ORDER — LACTULOSE 10 GM/15ML PO SOLN
20.0000 g | Freq: Three times a day (TID) | ORAL | Status: DC
  Administered 2020-04-18 – 2020-04-19 (×4): 20 g via ORAL
  Filled 2020-04-18 (×4): qty 30

## 2020-04-18 MED ORDER — LACTULOSE ENEMA
300.0000 mL | Freq: Once | ORAL | Status: AC
  Administered 2020-04-18: 300 mL via RECTAL
  Filled 2020-04-18: qty 300

## 2020-04-18 MED ORDER — DILTIAZEM 12 MG/ML ORAL SUSPENSION
90.0000 mg | Freq: Three times a day (TID) | ORAL | Status: DC
  Administered 2020-04-18 (×2): 90 mg via ORAL
  Filled 2020-04-18 (×3): qty 9

## 2020-04-18 MED ORDER — LACTULOSE 10 GM/15ML PO SOLN
30.0000 g | Freq: Three times a day (TID) | ORAL | Status: DC
  Administered 2020-04-18: 30 g via ORAL
  Filled 2020-04-18: qty 60

## 2020-04-18 NOTE — Progress Notes (Signed)
Sallee Lange, RN called and notified that the pt's HD has been moved to 04/18/20

## 2020-04-18 NOTE — Progress Notes (Addendum)
[facility] Progress Note     Assessment/ Plan:   1. CKD5, ESRD -Cr stable this am, BUN slightly up. No improvement in mental status unfortunately. No urgent indications for renal replacement therapy - daughter [name] was updated over the phone on 1/15 -appreciate palliative care's assistance, see note -will go ahead and start HD given that there has been zero improvement in her mental status. Uremia is a variable that will be addressed via renal replacement therapy -> if no improvement in mental status after 3 treatments then I would not plan on further treatments as she is not a long term dialysis candidate given her multiple comborbidities and poor functional status.  Appreciate VIR placing the right IJ  temporary catheter on 1/16.   Tentatively, will start Monday and will do HD again on Tues (2nd shift) and Wed.   Of note she is no longer on isolation for COVID.  - Daughter [name]) was in agreement with this plan  per discussion on 1/15 -Per office progress note/documentation, patient  refused to have dialysis done per the conversations between her and Dr. Hollie Salk (primary nephrologist). Family has rescinded all of patient's decisions, now full code -Avoid nephrotoxic medications including NSAIDs and iodinated intravenous contrast exposure unless the latter is absolutely indicated.  Preferred narcotic agents for pain control are hydromorphone, fentanyl, and methadone. Morphine should not be used. Avoid Baclofen and avoid oral sodium phosphate and magnesium citrate based laxatives / bowel preps. Continue strict Input and Output monitoring. Will monitor the patient closely with you and intervene or adjust therapy as indicated by changes in clinical status/labs   2. Acute metabolic encephalopathy -pall care on board. Mgmt per primary service -see above  3. Aspiration PNA -unasyn, mgmt per primary service  4. Recent COVID, 03/30/20  5. Metabolic acidosis, resolved 6.  HTN/volume -slightly volume up but dependent edema, chronic stasis changes bilateral LE's -VSS 7. Anemia -starting iron, 250mg  qweekly   Subjective:   Abnormal breathing yest at high risk for aspiration but CXR was clear and TF restarted on 1/16 evening.   Objective:   BP 121/63   Pulse 66   Temp 98 F (36.7 C)   Resp 20   Ht 5\' 8"  (1.727 m)   Wt 99.1 kg   SpO2 97%   BMI 33.22 kg/m   Intake/Output Summary (Last 24 hours) at 04/18/2020 1105 Last data filed at 04/18/2020 0300 Gross per 24 hour  Intake 1209 ml  Output 400 ml  Net 809 ml   Weight change: 0 kg  Physical Exam: Gen: chronically ill appearing CVS:reg rate Resp:normal wob FBP:ZWCHE, soft Ext:+dependent edema Neuro: nonverbal, unresponsive  Imaging: IR Fluoro Guide CV Line Right  Result Date: 04/17/2020 INDICATION: 75 year old with renal failure. Request for non tunneled dialysis catheter. EXAM: FLUOROSCOPIC AND ULTRASOUND GUIDED PLACEMENT OF A NON-TUNNELED DIALYSIS CATHETER Physician: Stephan Minister. Henn, MD MEDICATIONS: None ANESTHESIA/SEDATION: None FLUOROSCOPY TIME:  Fluoroscopy Time: 48 seconds, 3 mGy COMPLICATIONS: None immediate. PROCEDURE: Informed consent was obtained for catheter placement. The patient was placed supine on the interventional table. Ultrasound confirmed a patent right internal jugular vein. Ultrasound images were obtained for documentation. The right neck was prepped and draped in a sterile fashion. The right neck was anesthetized with 1% lidocaine. Maximal barrier sterile technique was utilized including caps, mask, sterile gowns, sterile gloves, sterile drape, hand hygiene and skin antiseptic. A small incision was made with #11 blade scalpel. Needle was directed into the right internal jugular vein using ultrasound guidance.  Wire was preferentially going into the right subclavian vein. Therefore, a 5 [place] Kumpe catheter was advanced over the wire and directed into the SVC. Kumpe catheter was  removed. A 16 cm Mahurkar catheter was selected. The catheter was advanced over a wire and positioned at the superior cavoatrial junction. Fluoroscopic images were obtained for documentation. Both dialysis lumens were found to aspirate and flush well. The proper amount of heparin was flushed in both lumens. The central venous lumen was flushed with normal saline. Catheter was sutured to skin. FINDINGS: Catheter tip at the superior cavoatrial junction. IMPRESSION: Successful placement of a right jugular non-tunneled dialysis catheter using ultrasound and fluoroscopic guidance. Electronically Signed   By: Markus Daft M.D.   On: 04/17/2020 09:01   IR US Guide Vasc Access Right  Result Date: 04/17/2020 INDICATION: 75 year old with renal failure. Request for non tunneled dialysis catheter. EXAM: FLUOROSCOPIC AND ULTRASOUND GUIDED PLACEMENT OF A NON-TUNNELED DIALYSIS CATHETER Physician: Stephan Minister. Henn, MD MEDICATIONS: None ANESTHESIA/SEDATION: None FLUOROSCOPY TIME:  Fluoroscopy Time: 48 seconds, 3 mGy COMPLICATIONS: None immediate. PROCEDURE: Informed consent was obtained for catheter placement. The patient was placed supine on the interventional table. Ultrasound confirmed a patent right internal jugular vein. Ultrasound images were obtained for documentation. The right neck was prepped and draped in a sterile fashion. The right neck was anesthetized with 1% lidocaine. Maximal barrier sterile technique was utilized including caps, mask, sterile gowns, sterile gloves, sterile drape, hand hygiene and skin antiseptic. A small incision was made with #11 blade scalpel. Needle was directed into the right internal jugular vein using ultrasound guidance. Wire was preferentially going into the right subclavian vein. Therefore, a 5 [place] Kumpe catheter was advanced over the wire and directed into the SVC. Kumpe catheter was removed. A 16 cm Mahurkar catheter was selected. The catheter was advanced over a wire and positioned at  the superior cavoatrial junction. Fluoroscopic images were obtained for documentation. Both dialysis lumens were found to aspirate and flush well. The proper amount of heparin was flushed in both lumens. The central venous lumen was flushed with normal saline. Catheter was sutured to skin. FINDINGS: Catheter tip at the superior cavoatrial junction. IMPRESSION: Successful placement of a right jugular non-tunneled dialysis catheter using ultrasound and fluoroscopic guidance. Electronically Signed   By: Markus Daft M.D.   On: 04/17/2020 09:01   DG Chest Port 1 View  Result Date: 04/17/2020 CLINICAL DATA:  History of COVID positive with low oxygen saturation. EXAM: PORTABLE CHEST 1 VIEW COMPARISON:  April 10, 2020 FINDINGS: The study is limited secondary to patient rotation. A right-sided PICC line is seen with its distal end mildly curved upward within the distal superior vena cava. A right internal jugular venous catheter is noted, with its distal end overlying the junction of the superior vena cava and right atrium. Interval nasogastric feeding tube placement is noted with its distal end overlying the body of the stomach. Mildly hazy appearing bilateral lung bases are seen which is likely positional in nature. There is no definite evidence of acute infiltrate, pleural effusion or pneumothorax. The cardiac silhouette is markedly enlarged and unchanged in size. The visualized skeletal structures are unremarkable. IMPRESSION: 1. Interval right internal jugular venous catheter and nasogastric feeding tube placement and positioning, as described above. 2. Right-sided PICC line with its distal end mildly curved upward within the distal superior vena cava. 3. Cardiomegaly with suspected positional hazy appearing bilateral lung bases. Mild bibasilar airspace disease cannot be excluded. Electronically Signed  By: Virgina Norfolk M.D.   On: 04/17/2020 17:32    Labs: BMET Recent Labs  Lab 04/12/20 0512  04/13/20 0452 04/14/20 0358 04/15/20 0543 04/16/20 0439 04/17/20 0618 04/18/20 0430  NA 142 139 142 141 139 139 137  K 3.3* 2.7* 3.8 3.4* 3.8 4.3 4.8  CL 105 99 103 105 103 104 103  CO2 23 27 24 22 24 23  21*  GLUCOSE 112* 290* 82 134* 122* 117* 135*  BUN 63* 55* 63* 68* 71* 76* 84*  CREATININE 5.31* 5.12* 5.78* 5.85* 5.85* 5.73* 5.78*  CALCIUM 7.7* 7.1* 8.0* 7.7* 7.5* 7.5* 7.8*   CBC Recent Labs  Lab 04/15/20 0543 04/16/20 0439 04/17/20 0618 04/18/20 0430  WBC 14.4* 12.8* 12.6* 12.3*  NEUTROABS  --   --  9.2* 8.8*  HGB 7.9* 7.7* 7.6* 7.5*  HCT 24.5* 23.8* 24.7* 24.6*  MCV 92.1 93.3 95.0 95.7  PLT 154 163 169 214    Medications:    . Chlorhexidine Gluconate Cloth  6 each Topical Daily  . Chlorhexidine Gluconate Cloth  6 each Topical Q0600  . [START ON 04/21/2020] darbepoetin (ARANESP) injection - NON-DIALYSIS  200 mcg Subcutaneous Q Thu-1800  . diltiazem  90 mg Oral Q8H  . feeding supplement (PROSource TF)  45 mL Per Tube Daily  . free water  150 mL Per Tube Q6H  . heparin  5,000 Units Subcutaneous Q8H  . metoprolol tartrate  100 mg Per Tube BID  . sodium chloride flush  10-40 mL Intracatheter Q12H      Otelia Santee, MD 04/18/2020, 11:05 AM

## 2020-04-18 NOTE — Progress Notes (Signed)
Physical Therapy Treatment Patient Details Name: Stephanie Griffith MRN: [id number] DOB: September 09, 1945 Today's Date: 04/18/2020    History of Present Illness Pt is a 75 y.o. female with a medical hx significant for pulmonary HTN, L AKA, PAT, PAF, PAD, HTN, hyperglycemia, COPD, chronic renal insufficiency stage IV, CHF, and anemia who presents with increased confusion and decreased Po instake since her ecent admission for COVID pneumonia and encephalopathy (D/C'd 04/04/20). Suspect underlying dementia with sundowning. Pt admitted for acute encephalopathy due to COVID-19. EEG showed: generalized periodic discharges with triphasic morphology at 2hz  which is on the ictal-interictal continuum and can sometimes be seen with toxic-metabolic etiology, moderate to severe diffuse encephalopathy with nonspecific etiology, and no seizures were seen. 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 Palliative consult 04/14/20 with family wanting full code, dialysis initiated and transfer to [place]    Patient with decr arousal despite attempts to arouse (hand over hand washing face with wet cloth), PROM, assisted rolling for pericare by RN, nail bed pressure. Noted family requesting LTACH on discharge from acute hospital. Patient will need to be more alert and able to participate with therapies. Will continue efforts and trial of therapy.     Follow Up Recommendations  Supervision/Assistance - 24 hour;LTACH (LTACH per family request (per Palliative note))     Equipment Recommendations  Hospital bed    Recommendations for Other Services       Precautions / Restrictions Precautions Precautions: Fall Precaution Comments: L AKA, prosthesis no longer fits has not worn for a long time.    Mobility  Bed Mobility Overal bed mobility: Needs Assistance Bed Mobility: Rolling Rolling: Total assist;+2 for physical assistance;+2 for safety/equipment          General bed mobility comments: Total assist +2 for all aspects. Patient does not initiate or follow commands.  Transfers                    Ambulation/Gait                 Stairs             Wheelchair Mobility    Modified Rankin (Stroke Patients Only)       Balance                                            Cognition Arousal/Alertness: Lethargic (MAR reviewed with no sedating meds recently)   Overall Cognitive Status: Difficult to assess                                 General Comments: Eyes closed; did not arouse to face washing, PROM, nail bed pressure, or rolling for pericare.      Exercises Other Exercises Other Exercises: PROM RLE flex/ext alljoints; hip abdct/adduction, hip IR/ER Other Exercises: PROM LUE to assist with washing face    General Comments General comments (skin integrity, edema, etc.): Per discussion with RN after session, pt's ammonia level is high and may account for her decr arousal. She has notified MD      Pertinent Vitals/Pain Pain Assessment: Faces Faces Pain Scale: No hurt    Home Living  Prior Function            PT Goals (current goals can now be found in the care plan section) Acute Rehab PT Goals Patient Stated Goal: Unable to state Time For Goal Achievement: 02-May-2020 Potential to Achieve Goals: Fair Progress towards PT goals: Not progressing toward goals - comment (lethargy)    Frequency    Min 2X/week      PT Plan Discharge plan needs to be updated;Frequency needs to be updated    Co-evaluation              AM-PAC PT "6 Clicks" Mobility   Outcome Measure  Help needed turning from your back to your side while in a flat bed without using bedrails?: Total Help needed moving from lying on your back to sitting on the side of a flat bed without using bedrails?: Total Help needed moving to and from a bed to a chair (including  a wheelchair)?: Total Help needed standing up from a chair using your arms (e.g., wheelchair or bedside chair)?: Total Help needed to walk in hospital room?: Total Help needed climbing 3-5 steps with a railing? : Total 6 Click Score: 6    End of Session Equipment Utilized During Treatment: Oxygen Activity Tolerance: Patient limited by lethargy Patient left: in bed;with call bell/phone within reach;with bed alarm set Nurse Communication: Need for lift equipment;Other (comment) (dedr arousal; needs cleaning) PT Visit Diagnosis: Muscle weakness (generalized) (M62.81)     Time: 9728-2060 PT Time Calculation (min) (ACUTE ONLY): 15 min  Charges:  $Therapeutic Exercise: 8-22 mins                      Arby Barrette, PT Pager [phone]    Rexanne Mano 04/18/2020, 11:38 AM

## 2020-04-18 NOTE — Progress Notes (Signed)
SLP Cancellation Note  Patient Details Name: Stephanie Griffith MRN: [id number] DOB: June 30, 1945   Cancelled treatment:        Attempted intervention for dysphagia and ability to participate in instrumental assessment. Given max verbal and tactile stimulation she was unable to respond. Eyes closed, open mouth posture, slight moaning and not able to participate. Will continue efforts.   Houston Siren 04/18/2020, 9:52 AM Orbie Pyo Colvin Caroli.Ed Risk analyst [phone] Office [phone]

## 2020-04-18 NOTE — Progress Notes (Signed)
PROGRESS NOTE    Stephanie Griffith  MGQ:[id number] DOB: 28-Jan-1946 DOA: 04/06/2020 PCP: [name], MD    Brief Narrative:  75 year old female with history of chronic diastolic heart failure, chronic kidney disease stage V, history of COPD, hypertension hyperlipidemia, paroxysmal A. fib and pulmonary hypertension with vascular dementia who was recently hospitalized for UTI, COVID-19 pneumonia and had further deterioration of her mental status.  She was discharged home on January 3 on family request.  Came back to emergency room on January 6 with worsening confusion, not eating.  Admitted with metabolic encephalopathy.  Treated for aspiration pneumonia.  Remains persistently encephalopathic. Reportedly patient had declined to have hemodialysis at outpatient follow-ups. 1/13, daughters rescinded DNR, decided to pursue with Griffith treatments.  Tube feeding started. 1/16, temporary HD catheter placed to plan for trial of dialysis. 1/16, patient remains obtunded. tachycardic at times.  Unable to wake up.   Multiple rapid responses.  CT scans with old  Microvascular changes.  Patient with no meaningful recovery, multiple medical issues and not a rehab candidate at this situation. She is appropriate for comfort care and hospice , however her daughters made decision to give her a trial of dialysis. Planned for HD tonight.   Assessment & Plan:   Principal Problem:   Acute metabolic encephalopathy Active Problems:   Normocytic anemia   Leukocytosis   PAD (peripheral artery disease) (HCC)   S/P AKA (above knee amputation) (HCC)   Chronic renal insufficiency, stage IV (severe) (HCC)   Metabolic acidosis   Chronic diastolic CHF (congestive heart failure) (HCC)   Encephalopathy due to COVID-19 virus   Dyslipidemia   Acute encephalopathy   Atrial fibrillation with RVR ([place])   Advanced care planning/counseling discussion   Goals of care, counseling/discussion  1. Acute metabolic encephalopathy,  profound encephalopathy with underlying vascular dementia.  Probably exacerbated by recent UTI and COVID-19 infection. Currently on symptomatic treatment.  Remains persistently encephalopathic. Dobbhoff tube feeding, tolerating well. Oxygenation is stable. Palliative care consulted, family decided to go aggressive treatment. EEG showed triphasic morphology, started on Keppra that was stopped because of mentation. Uremia might have added to her overall encephalopathy. Patient not a permanent dialysis candidate, however family want to see if she responds to temporary dialysis. If patient responds to temporary dialysis, will pursue rehab. If patient does not respond to temporary dialysis, goal of care discussion will be revisited. Received right IJ catheter 1/16. Ammonia elevated today, more than 100.  Patient is obtunded with GCS of 4 but maintaining her saturations and airway.  Chest x-ray stable.  With no evidence of new findings. Will try lactulose enema and subsequent oral lactulose to induce diarrhea today. Repeat CT scan today to rule out any intracranial bleeding or new stroke.  2.  Suspected aspiration pneumonia: Completed 5 days of antibiotics.  3.  Rapid A. fib: Rate variable.  On increased dose of metoprolol.  We will add scheduled Cardizem liquid through NG tube. Not an anticoagulation candidate with altered mental status.  4.  Hypokalemia/hypomagnesemia: Replaced.  5.  CKD stage V: As above.  Creatinine continues to worsen.  Mental status continues to worsen. As above, planning to start on temporary dialysis today.   DVT prophylaxis: heparin injection 5,000 Units Start: 04/08/20 0730   Code Status: Full code Family Communication: We will update patient's daughter Ms. [name]. Disposition Plan: Status is: Inpatient  Remains inpatient appropriate because:Inpatient level of care appropriate due to severity of illness   Dispo: The patient is from: Home  Anticipated d/c is to: Unknown.  Doubtfull recovery.              Anticipated d/c date is: > 3 days              Patient currently is not medically stable to d/c.         Consultants:   Nephrology  Palliative medicine  Procedures:   None  Antimicrobials:   Completed   Subjective: Patient seen and examined.  Overnight events noted.  Called rapid response last night with worsening breathing pattern.  Repeat chest x-ray stable.  Blood gas analysis shows normal oxygen and CO2 levels.  Currently on 4 L oxygen.  Patient is totally obtunded and does not wince on painful stimuli. Telemetry shows variable heart rate, mostly rapid A. fib.  Objective: Vitals:   04/18/20 0800 04/18/20 0900 04/18/20 1000 04/18/20 1123  BP: 118/89 (!) 126/99 121/63 123/71  Pulse: (!) 139 (!) 133 66 69  Resp: (!) 29 (!) 28 20 20   Temp: 98.1 F (36.7 C) 98 F (36.7 C) 98 F (36.7 C) 98 F (36.7 C)  TempSrc:      SpO2: 97% 97% 97% 97%  Weight:      Height:        Intake/Output Summary (Last 24 hours) at 04/18/2020 1148 Last data filed at 04/18/2020 0300 Gross per 24 hour  Intake 1209 ml  Output 400 ml  Net 809 ml   Filed Weights   04/29/2020 1632 04/17/20 0500 04/18/20 0422  Weight: 89.8 kg 99.1 kg 99.1 kg    Examination:  General exam: Sick looking. obtunded and unresponsive. Respiratory system: Clear to auscultation. Respiratory effort normal.  Some conducted airway sounds. Cardiovascular system: S1 & S2 heard, irregularly irregular.  Tachycardic. Chronic stasis changes and discoloration right leg. Left leg amputation stump clean and dry. 1+ edema both upper extremities. Gastrointestinal system: Soft.  Nontender.  Bowel sounds present.   Central nervous system: Does not respond. Does not follow any commands.    Data Reviewed: I have personally reviewed following labs and imaging studies  CBC: Recent Labs  Lab 04/13/20 0452 04/15/20 0543 04/16/20 0439 04/17/20 0618  04/18/20 0430  WBC 13.0* 14.4* 12.8* 12.6* 12.3*  NEUTROABS  --   --   --  9.2* 8.8*  HGB 7.4* 7.9* 7.7* 7.6* 7.5*  HCT 23.1* 24.5* 23.8* 24.7* 24.6*  MCV 91.3 92.1 93.3 95.0 95.7  PLT 158 154 163 169 633   Basic Metabolic Panel: Recent Labs  Lab 04/13/20 0736 04/14/20 0358 04/15/20 0543 04/16/20 0439 04/17/20 0618 04/18/20 0430  NA  --  142 141 139 139 137  K  --  3.8 3.4* 3.8 4.3 4.8  CL  --  103 105 103 104 103  CO2  --  24 22 24 23  21*  GLUCOSE  --  82 134* 122* 117* 135*  BUN  --  63* 68* 71* 76* 84*  CREATININE  --  5.78* 5.85* 5.85* 5.73* 5.78*  CALCIUM  --  8.0* 7.7* 7.5* 7.5* 7.8*  MG 1.6*  --   --  2.3  --   --    GFR: Estimated Creatinine Clearance: 10.5 mL/min (A) (by C-G formula based on SCr of 5.78 mg/dL (H)). Liver Function Tests: No results for input(s): AST, ALT, ALKPHOS, BILITOT, PROT, ALBUMIN in the last 168 hours. No results for input(s): LIPASE, AMYLASE in the last 168 hours. Recent Labs  Lab 04/13/20 0454 04/18/20 0520  AMMONIA 46* 118*  Coagulation Profile: No results for input(s): INR, PROTIME in the last 168 hours. Cardiac Enzymes: No results for input(s): CKTOTAL, CKMB, CKMBINDEX, TROPONINI in the last 168 hours. BNP (last 3 results) No results for input(s): PROBNP in the last 8760 hours. HbA1C: No results for input(s): HGBA1C in the last 72 hours. CBG: Recent Labs  Lab 04/17/20 1154 04/17/20 1631 04/17/20 1955 04/18/20 0028 04/18/20 0416  GLUCAP 139* 130* 115* 144* 119*   Lipid Profile: No results for input(s): CHOL, HDL, LDLCALC, TRIG, CHOLHDL, LDLDIRECT in the last 72 hours. Thyroid Function Tests: No results for input(s): TSH, T4TOTAL, FREET4, T3FREE, THYROIDAB in the last 72 hours. Anemia Panel: No results for input(s): VITAMINB12, FOLATE, FERRITIN, TIBC, IRON, RETICCTPCT in the last 72 hours. Sepsis Labs: No results for input(s): PROCALCITON, LATICACIDVEN in the last 168 hours.  No results found for this or any  previous visit (from the past 240 hour(s)).       Radiology Studies: IR Fluoro Guide CV Line Right  Result Date: 04/17/2020 INDICATION: 75 year old with renal failure. Request for non tunneled dialysis catheter. EXAM: FLUOROSCOPIC AND ULTRASOUND GUIDED PLACEMENT OF A NON-TUNNELED DIALYSIS CATHETER Physician: Stephan Minister. Henn, MD MEDICATIONS: None ANESTHESIA/SEDATION: None FLUOROSCOPY TIME:  Fluoroscopy Time: 48 seconds, 3 mGy COMPLICATIONS: None immediate. PROCEDURE: Informed consent was obtained for catheter placement. The patient was placed supine on the interventional table. Ultrasound confirmed a patent right internal jugular vein. Ultrasound images were obtained for documentation. The right neck was prepped and draped in a sterile fashion. The right neck was anesthetized with 1% lidocaine. Maximal barrier sterile technique was utilized including caps, mask, sterile gowns, sterile gloves, sterile drape, hand hygiene and skin antiseptic. A small incision was made with #11 blade scalpel. Needle was directed into the right internal jugular vein using ultrasound guidance. Wire was preferentially going into the right subclavian vein. Therefore, a 5 [place] Kumpe catheter was advanced over the wire and directed into the SVC. Kumpe catheter was removed. A 16 cm Mahurkar catheter was selected. The catheter was advanced over a wire and positioned at the superior cavoatrial junction. Fluoroscopic images were obtained for documentation. Both dialysis lumens were found to aspirate and flush well. The proper amount of heparin was flushed in both lumens. The central venous lumen was flushed with normal saline. Catheter was sutured to skin. FINDINGS: Catheter tip at the superior cavoatrial junction. IMPRESSION: Successful placement of a right jugular non-tunneled dialysis catheter using ultrasound and fluoroscopic guidance. Electronically Signed   By: Markus Daft M.D.   On: 04/17/2020 09:01   IR US Guide Vasc Access  Right  Result Date: 04/17/2020 INDICATION: 74 year old with renal failure. Request for non tunneled dialysis catheter. EXAM: FLUOROSCOPIC AND ULTRASOUND GUIDED PLACEMENT OF A NON-TUNNELED DIALYSIS CATHETER Physician: Stephan Minister. Henn, MD MEDICATIONS: None ANESTHESIA/SEDATION: None FLUOROSCOPY TIME:  Fluoroscopy Time: 48 seconds, 3 mGy COMPLICATIONS: None immediate. PROCEDURE: Informed consent was obtained for catheter placement. The patient was placed supine on the interventional table. Ultrasound confirmed a patent right internal jugular vein. Ultrasound images were obtained for documentation. The right neck was prepped and draped in a sterile fashion. The right neck was anesthetized with 1% lidocaine. Maximal barrier sterile technique was utilized including caps, mask, sterile gowns, sterile gloves, sterile drape, hand hygiene and skin antiseptic. A small incision was made with #11 blade scalpel. Needle was directed into the right internal jugular vein using ultrasound guidance. Wire was preferentially going into the right subclavian vein. Therefore, a 5 [place] Kumpe catheter was advanced  over the wire and directed into the SVC. Kumpe catheter was removed. A 16 cm Mahurkar catheter was selected. The catheter was advanced over a wire and positioned at the superior cavoatrial junction. Fluoroscopic images were obtained for documentation. Both dialysis lumens were found to aspirate and flush well. The proper amount of heparin was flushed in both lumens. The central venous lumen was flushed with normal saline. Catheter was sutured to skin. FINDINGS: Catheter tip at the superior cavoatrial junction. IMPRESSION: Successful placement of a right jugular non-tunneled dialysis catheter using ultrasound and fluoroscopic guidance. Electronically Signed   By: Markus Daft M.D.   On: 04/17/2020 09:01   DG Chest Port 1 View  Result Date: 04/17/2020 CLINICAL DATA:  History of COVID positive with low oxygen saturation. EXAM:  PORTABLE CHEST 1 VIEW COMPARISON:  April 10, 2020 FINDINGS: The study is limited secondary to patient rotation. A right-sided PICC line is seen with its distal end mildly curved upward within the distal superior vena cava. A right internal jugular venous catheter is noted, with its distal end overlying the junction of the superior vena cava and right atrium. Interval nasogastric feeding tube placement is noted with its distal end overlying the body of the stomach. Mildly hazy appearing bilateral lung bases are seen which is likely positional in nature. There is no definite evidence of acute infiltrate, pleural effusion or pneumothorax. The cardiac silhouette is markedly enlarged and unchanged in size. The visualized skeletal structures are unremarkable. IMPRESSION: 1. Interval right internal jugular venous catheter and nasogastric feeding tube placement and positioning, as described above. 2. Right-sided PICC line with its distal end mildly curved upward within the distal superior vena cava. 3. Cardiomegaly with suspected positional hazy appearing bilateral lung bases. Mild bibasilar airspace disease cannot be excluded. Electronically Signed   By: Virgina Norfolk M.D.   On: 04/17/2020 17:32        Scheduled Meds: . Chlorhexidine Gluconate Cloth  6 each Topical Daily  . Chlorhexidine Gluconate Cloth  6 each Topical Q0600  . [START ON 04/21/2020] darbepoetin (ARANESP) injection - NON-DIALYSIS  200 mcg Subcutaneous Q Thu-1800  . diltiazem  90 mg Oral Q8H  . feeding supplement (PROSource TF)  45 mL Per Tube Daily  . free water  150 mL Per Tube Q6H  . heparin  5,000 Units Subcutaneous Q8H  . lactulose  30 g Oral TID  . lactulose  300 mL Rectal Once  . metoprolol tartrate  100 mg Per Tube BID  . sodium chloride flush  10-40 mL Intracatheter Q12H   Continuous Infusions: . feeding supplement (OSMOLITE 1.2 CAL) 1,000 mL (04/18/20 0442)  . ferric gluconate (FERRLECIT/NULECIT) IV Stopped (04/15/20 1922)      LOS: 10 days    Time spent: 30 minutes    Barb Merino, MD [facility] Pager [phone]

## 2020-04-19 DIAGNOSIS — G9341 Metabolic encephalopathy: Secondary | ICD-10-CM | POA: Diagnosis not present

## 2020-04-19 DIAGNOSIS — Z7189 Other specified counseling: Secondary | ICD-10-CM | POA: Diagnosis not present

## 2020-04-19 LAB — BASIC METABOLIC PANEL
Anion gap: 13 (ref 5–15)
BUN: 94 mg/dL — ABNORMAL HIGH (ref 8–23)
CO2: 21 mmol/L — ABNORMAL LOW (ref 22–32)
Calcium: 8.1 mg/dL — ABNORMAL LOW (ref 8.9–10.3)
Chloride: 104 mmol/L (ref 98–111)
Creatinine, Ser: 6.11 mg/dL — ABNORMAL HIGH (ref 0.44–1.00)
GFR, Estimated: 7 mL/min — ABNORMAL LOW (ref >60)
Glucose, Bld: 144 mg/dL — ABNORMAL HIGH (ref 70–99)
Potassium: 4.4 mmol/L (ref 3.5–5.1)
Sodium: 138 mmol/L (ref 135–145)

## 2020-04-19 LAB — CBC WITH DIFFERENTIAL/PLATELET
Abs Immature Granulocytes: 0.37 10*3/uL — ABNORMAL HIGH (ref 0.00–0.07)
Basophils Absolute: 0.1 10*3/uL (ref 0.0–0.1)
Basophils Relative: 0 %
Eosinophils Absolute: 0.3 10*3/uL (ref 0.0–0.5)
Eosinophils Relative: 1 %
HCT: 24.4 % — ABNORMAL LOW (ref 36.0–46.0)
Hemoglobin: 7.2 g/dL — ABNORMAL LOW (ref 12.0–15.0)
Immature Granulocytes: 2 %
Lymphocytes Relative: 6 %
Lymphs Abs: 1.1 10*3/uL (ref 0.7–4.0)
MCH: 29.3 pg (ref 26.0–34.0)
MCHC: 29.5 g/dL — ABNORMAL LOW (ref 30.0–36.0)
MCV: 99.2 fL (ref 80.0–100.0)
Monocytes Absolute: 1.2 10*3/uL — ABNORMAL HIGH (ref 0.1–1.0)
Monocytes Relative: 6 %
Neutro Abs: 15.4 10*3/uL — ABNORMAL HIGH (ref 1.7–7.7)
Neutrophils Relative %: 85 %
Platelets: 227 10*3/uL (ref 150–400)
RBC: 2.46 MIL/uL — ABNORMAL LOW (ref 3.87–5.11)
RDW: 19.3 % — ABNORMAL HIGH (ref 11.5–15.5)
WBC: 18.3 10*3/uL — ABNORMAL HIGH (ref 4.0–10.5)
nRBC: 0.3 % — ABNORMAL HIGH (ref 0.0–0.2)

## 2020-04-19 LAB — GLUCOSE, CAPILLARY
Glucose-Capillary: 116 mg/dL — ABNORMAL HIGH (ref 70–99)
Glucose-Capillary: 123 mg/dL — ABNORMAL HIGH (ref 70–99)
Glucose-Capillary: 129 mg/dL — ABNORMAL HIGH (ref 70–99)
Glucose-Capillary: 139 mg/dL — ABNORMAL HIGH (ref 70–99)
Glucose-Capillary: 140 mg/dL — ABNORMAL HIGH (ref 70–99)
Glucose-Capillary: 162 mg/dL — ABNORMAL HIGH (ref 70–99)

## 2020-04-19 LAB — AMMONIA: Ammonia: 103 umol/L — ABNORMAL HIGH (ref 9–35)

## 2020-04-19 MED ORDER — ALTEPLASE 2 MG IJ SOLR
2.0000 mg | Freq: Once | INTRAMUSCULAR | Status: AC
  Administered 2020-04-19: 2 mg
  Filled 2020-04-19: qty 2

## 2020-04-19 NOTE — Progress Notes (Signed)
SLP Cancellation Note  Patient Details Name: Stephanie Griffith MRN: [id number] DOB: 1945/12/08   Cancelled treatment:        Attempted to see pt for readiness for MBSS.  Pt remains unresponsive and is unable to participate in PO trials at this time.     Celedonio Savage, [place], [place] Office: [phone]  04/19/2020, 9:19 AM

## 2020-04-19 NOTE — Progress Notes (Addendum)
Daily Progress Note   Patient Name: Stephanie Griffith       Date: 04/19/2020 DOB: [birth year]  Age: 75 y.o. MRN#: [id number] Attending Physician: Barb Merino, MD Primary Care Physician: [name], MD Admit Date: 04/16/2020  Reason for Consultation/Follow-up: Establishing goals of care  Subjective: Patient in bed, brow furrowed. Barely opens her eyes with slight touch, verbal greeting. Does not follow commands. No family at bedside.  Chart reviewed- noted she is scheduled for dialysis today.  There does not appear to be current improvement in her status.   Review of Systems  Unable to perform ROS: Mental status change    Length of Stay: 11  Current Medications: Scheduled Meds:  . Chlorhexidine Gluconate Cloth  6 each Topical Daily  . Chlorhexidine Gluconate Cloth  6 each Topical Q0600  . [START ON 04/21/2020] darbepoetin (ARANESP) injection - NON-DIALYSIS  200 mcg Subcutaneous Q Thu-1800  . diltiazem  60 mg Oral Q8H  . feeding supplement (PROSource TF)  45 mL Per Tube Daily  . free water  150 mL Per Tube Q6H  . heparin  5,000 Units Subcutaneous Q8H  . lactulose  20 g Oral TID  . metoprolol tartrate  100 mg Per Tube BID  . sodium chloride flush  10-40 mL Intracatheter Q12H    Continuous Infusions: . feeding supplement (OSMOLITE 1.2 CAL) 1,000 mL (04/19/20 0200)  . ferric gluconate (FERRLECIT/NULECIT) IV Stopped (04/15/20 1922)    PRN Meds: acetaminophen (TYLENOL) oral liquid 160 mg/5 mL, [DISCONTINUED] acetaminophen **OR** acetaminophen, albuterol, bisacodyl, lidocaine, metoprolol tartrate, sodium chloride flush  Physical Exam          Vital Signs: BP (!) 131/59 (BP Location: Left Arm)   Pulse 72   Temp 98.2 F (36.8 C) (Axillary)   Resp 17   Ht '5\' 8"'$  (1.727 m)   Wt  97.2 kg   SpO2 98%   BMI 32.58 kg/m  SpO2: SpO2: 98 % O2 Device: O2 Device: Nasal Cannula O2 Flow Rate: O2 Flow Rate (L/min): 4 L/min  Intake/output summary: No intake or output data in the 24 hours ending 04/19/20 1536 LBM: Last BM Date: 04/19/20 Baseline Weight: Weight: 89.8 kg Most recent weight: Weight: 97.2 kg       Palliative Assessment/Data:       Patient Active Problem List  Diagnosis Date Noted  . Advanced care planning/counseling discussion   . Goals of care, counseling/discussion   . Acute encephalopathy 04/16/2020  . Atrial fibrillation with RVR ([place]) 05/01/2020  . Encephalopathy due to COVID-19 virus 03/30/2020  . Dyslipidemia 03/30/2020  . Acute renal failure superimposed on stage 5 chronic kidney disease, not on chronic dialysis ([place]) 03/03/2020  . Chronic diastolic CHF (congestive heart failure) ([place]) 03/03/2020  . Acute metabolic encephalopathy [id number]  . Acute on chronic diastolic CHF (congestive heart failure) ([place]) 02/06/2019  . Chronic renal insufficiency, stage IV (severe) ([place]) 02/06/2019  . Metabolic acidosis [id number]  . Pressure injury of skin 01/03/2016  . Acute hypoxemic respiratory failure ([place])   . Acute pulmonary edema ([facility])   . Hyperkalemia 12/30/2015  . Incarcerated hernia 12/30/2015  . Phantom limb pain ([place]) 08/13/2011  . PVD (peripheral vascular disease) with claudication ([place]) 07/26/2011  . S/P AKA (above knee amputation) ([place]) 06/15/2011  . NSTEMI (non-ST elevated myocardial infarction) ([place]) 06/09/2011  . Acute diastolic heart failure ([place]) 06/09/2011  . Atrial fibrillation ([place]) 06/08/2011  . Cholelithiasis and cholecystitis without obstruction 06/03/2011  . Ventral hernia 06/03/2011  . Acute on chronic renal failure ([place]) 06/02/2011  . PAD (peripheral artery disease) ([place]) 06/02/2011  . Dry gangrene ([place]) 05/31/2011  . Abdominal pain 05/31/2011  . Leukocytosis 05/31/2011  . Hyponatremia 05/31/2011  . Hyperglycemia  05/31/2011  . Normocytic anemia 12/22/2007  . Chronic kidney disease with symptom management only, stage 5 ([place]) 12/22/2007  . CHEST PAIN, HX OF 12/22/2007  . Essential hypertension, benign 12/10/2007    Palliative Care Assessment & Plan   Patient Profile: 75 y.o. female  with past medical history of COPD, CHF, ESRD, recent admission for COVID 19 infection, vascular dementia admitted on 04/25/2020 with mental status changes. Found to have elevated ammonia, worsening kidney function, pulmonary congestion, a-fib with RVR. Now with likely aspiration pneumonia. Mental status has waxed and waned over admission. CT scan of head did not show acute changes- but did show chronic ischemic disease and remote R cerebellar infarct.  Feeding tube has been placed. Palliative medicine consulted for goals of care.   Assessment/Recommendations/Plan   Not improving with current interventions  Will await outcome of dialysis trial and then further discuss GOC with family   Attempted to call patient's daughter- [name]- left message Addendum- received call back from [place]- no questions or concerns- she is eager for her mother to complete dialysis. She states that patient will need 3 dialysis treatments before evaluating outcomes. Will plan to follow along. Goals of Care and Additional Recommendations:  Limitations on Scope of Treatment: Full Scope Treatment  Code Status:  Full code  Prognosis:   Unable to determine  Discharge Planning:  To Be Determined  Thank you for allowing the Palliative Medicine Team to assist in the care of this patient.   Total time: 28 mins Greater than 50%  of this time was spent counseling and coordinating care related to the above assessment and plan.  Mariana Kaufman, AGNP-C Palliative Medicine   Please contact Palliative Medicine Team phone at [phone] for questions and concerns.

## 2020-04-19 NOTE — Progress Notes (Signed)
PROGRESS NOTE    Stephanie Griffith  [id number] DOB: [birth year] DOA: 04/10/2020 PCP: [name], MD    Brief Narrative:  75 year old female with history of chronic diastolic heart failure, chronic kidney disease stage V, history of COPD, hypertension hyperlipidemia, paroxysmal A. fib and pulmonary hypertension with vascular dementia who was recently hospitalized for UTI, COVID-19 pneumonia and had further deterioration of her mental status.  She was discharged home on January 3 on family request.  Came back to emergency room on January 6 with worsening confusion, not eating.  Admitted with metabolic encephalopathy.  Treated for aspiration pneumonia.  Remains persistently encephalopathic. Reportedly patient had declined to have hemodialysis at outpatient follow-ups. 1/13, daughters rescinded DNR, decided to pursue with all treatments.  Tube feeding started. 1/16, temporary HD catheter placed to plan for trial of dialysis. 1/16, patient remains obtunded. tachycardic at times.  Unable to wake up.   Multiple rapid responses.  CT scans with old  Microvascular changes.  Patient with no meaningful recovery, multiple medical issues and not a rehab candidate at this situation. She is appropriate for comfort care and hospice , however her daughters made decision to give her a trial of dialysis. Planned for HD today 1/18.   Assessment & Plan:   Principal Problem:   Acute metabolic encephalopathy Active Problems:   Normocytic anemia   Leukocytosis   PAD (peripheral artery disease) (H[facility])   S/P AKA (above knee amputation) (H[facility])   Chronic renal insufficiency, stage IV (severe) (H[facility])   Metabolic acidosis   Chronic diastolic CHF (congestive heart failure) (H[facility])   Encephalopathy due to COVID-19 virus   Dyslipidemia   Acute encephalopathy   Atrial fibrillation with RVR ([place])   Advanced care planning/counseling discussion   Goals of care, counseling/discussion  1. Acute metabolic encephalopathy,  profound encephalopathy with underlying vascular dementia.  Probably exacerbated by recent UTI and COVID-19 infection. Suspect Uremia. Currently on symptomatic treatment.  Remains persistently encephalopathic. Dobbhoff tube feeding, tolerating well. Oxygenation is stable. Palliative care consulted, family decided to go aggressive treatment. EEG showed triphasic morphology, started on Keppra that was stopped because of severe depressed mentation. Uremia might have added to her overall encephalopathy. Patient not a permanent dialysis candidate, however family want to see if she responds to temporary dialysis. If patient responds to temporary dialysis, will pursue rehab. If patient does not respond to temporary dialysis, goal of care discussion will be revisited. Received right IJ catheter 1/16. Ammonia elevated , trying lactulose . Patient is obtunded with GCS of 4 but maintaining her saturations and airway.  Chest x-ray stable.  With no evidence of any new findings. Will try lactulose enema and subsequent oral lactulose to induce diarrhea.  2.  Suspected aspiration pneumonia: Completed 5 days of antibiotics. Very high risk of aspiration.  3.  Rapid A. fib: Rate variable.  Today patient is sinus rhythm on metoprolol and scheduled Cardizem. Not an anticoagulation candidate with altered mental status.  4.  Hypokalemia/hypomagnesemia: Replaced.  5.  CKD stage V: As above.  Creatinine continues to worsen.  Mental status continues to worsen. As above, planning to start on temporary dialysis today.   DVT prophylaxis: heparin injection 5,000 Units Start: 04/08/20 0730   Code Status: Full code Family Communication: We will update patient's daughter Ms. [name]. Disposition Plan: Status is: Inpatient  Remains inpatient appropriate because:Inpatient level of care appropriate due to severity of illness   Dispo: The patient is from: Home  Anticipated d/c is to: Unknown.  Doubtfull  recovery.              Anticipated d/c date is: > 3 days              Patient currently is not medically stable to d/c.         Consultants:   Nephrology  Palliative medicine  Procedures:   None  Antimicrobials:   Completed   Subjective: Patient seen and examined.  Remains obtunded with no overall change in status.  Unable to respond. Tolerating tube feeding. Heart rate is fairly controlled. Right hand is swollen, will remove PICC line.  HD catheter has additional lines for IV medicine. Multiple loose bowel movement with lactulose, but not waking up.  Objective: Vitals:   04/19/20 0510 04/19/20 0604 04/19/20 0704 04/19/20 0800  BP: (!) 110/54 131/64 128/61 (!) 123/55  Pulse:    72  Resp: '17 16 17 17  '$ Temp: 98.5 F (36.9 C)   98 F (36.7 C)  TempSrc: Oral   Axillary  SpO2: 98% 99% 98% 98%  Weight:      Height:       No intake or output data in the 24 hours ending 04/19/20 1036 Filed Weights   04/17/20 0500 04/18/20 0422 04/19/20 0413  Weight: 99.1 kg 99.1 kg 97.2 kg    Examination:  General exam: Sick looking. obtunded and unresponsive. Respiratory system: Clear to auscultation. Respiratory effort normal.  Some conducted airway sounds. Cardiovascular system: S1 & S2 heard, regular.  Chronic stasis changes and discoloration right leg/ pigmentation. Left leg amputation stump clean and dry. 1+ edema both upper extremities. Gastrointestinal system: Soft.  Nontender.  Bowel sounds present.   Central nervous system: Does not respond. Does not follow any commands. Foley catheter has 200 ml urine on it since yesterday.   Data Reviewed: I have personally reviewed following labs and imaging studies  CBC: Recent Labs  Lab 04/15/20 0543 04/16/20 0439 04/17/20 0618 04/18/20 0430 04/19/20 0633  WBC 14.4* 12.8* 12.6* 12.3* 18.3*  NEUTROABS  --   --  9.2* 8.8* 15.4*  HGB 7.9* 7.7* 7.6* 7.5* 7.2*  HCT 24.5* 23.8* 24.7* 24.6* 24.4*  MCV 92.1 93.3 95.0 95.7  99.2  PLT 154 163 169 214 [id number]   Basic Metabolic Panel: Recent Labs  Lab 04/13/20 0736 04/14/20 0358 04/15/20 0543 04/16/20 0439 04/17/20 0618 04/18/20 0430 04/19/20 0633  NA  --    < > 141 139 139 137 138  K  --    < > 3.4* 3.8 4.3 4.8 4.4  CL  --    < > 105 103 104 103 104  CO2  --    < > '22 24 23 '$ 21* 21*  GLUCOSE  --    < > 134* 122* 117* 135* 144*  BUN  --    < > 68* 71* 76* 84* 94*  CREATININE  --    < > 5.85* 5.85* 5.73* 5.78* 6.11*  CALCIUM  --    < > 7.7* 7.5* 7.5* 7.8* 8.1*  MG 1.6*  --   --  2.3  --   --   --    < > = values in this interval not displayed.   GFR: Estimated Creatinine Clearance: 9.8 mL/min (A) (by C-G formula based on SCr of 6.11 mg/dL (H)). Liver Function Tests: Recent Labs  Lab 04/18/20 0430  AST 33  ALT 19  ALKPHOS 108  BILITOT 0.7  PROT 5.6*  ALBUMIN 1.7*   No results for input(s): LIPASE, AMYLASE in the last 168 hours. Recent Labs  Lab 04/13/20 0454 04/18/20 0520 04/19/20 0633  AMMONIA 46* 118* 103*   Coagulation Profile: No results for input(s): INR, PROTIME in the last 168 hours. Cardiac Enzymes: No results for input(s): CKTOTAL, CKMB, CKMBINDEX, TROPONINI in the last 168 hours. BNP (last 3 results) No results for input(s): PROBNP in the last 8760 hours. HbA1C: No results for input(s): HGBA1C in the last 72 hours. CBG: Recent Labs  Lab 04/18/20 0416 04/18/20 2228 04/19/20 0057 04/19/20 0559 04/19/20 0803  GLUCAP 119* 137* 162* 123* 140*   Lipid Profile: No results for input(s): CHOL, HDL, LDLCALC, TRIG, CHOLHDL, LDLDIRECT in the last 72 hours. Thyroid Function Tests: No results for input(s): TSH, T4TOTAL, FREET4, T3FREE, THYROIDAB in the last 72 hours. Anemia Panel: No results for input(s): VITAMINB12, FOLATE, FERRITIN, TIBC, IRON, RETICCTPCT in the last 72 hours. Sepsis Labs: No results for input(s): PROCALCITON, LATICACIDVEN in the last 168 hours.  No results found for this or any previous visit (from the past  240 hour(s)).       Radiology Studies: DG Chest Port 1 View  Result Date: 04/17/2020 CLINICAL DATA:  History of COVID positive with low oxygen saturation. EXAM: PORTABLE CHEST 1 VIEW COMPARISON:  April 10, 2020 FINDINGS: The study is limited secondary to patient rotation. A right-sided PICC line is seen with its distal end mildly curved upward within the distal superior vena cava. A right internal jugular venous catheter is noted, with its distal end overlying the junction of the superior vena cava and right atrium. Interval nasogastric feeding tube placement is noted with its distal end overlying the body of the stomach. Mildly hazy appearing bilateral lung bases are seen which is likely positional in nature. There is no definite evidence of acute infiltrate, pleural effusion or pneumothorax. The cardiac silhouette is markedly enlarged and unchanged in size. The visualized skeletal structures are unremarkable. IMPRESSION: 1. Interval right internal jugular venous catheter and nasogastric feeding tube placement and positioning, as described above. 2. Right-sided PICC line with its distal end mildly curved upward within the distal superior vena cava. 3. Cardiomegaly with suspected positional hazy appearing bilateral lung bases. Mild bibasilar airspace disease cannot be excluded. Electronically Signed   By: Virgina Norfolk M.D.   On: 04/17/2020 17:32        Scheduled Meds: . Chlorhexidine Gluconate Cloth  6 each Topical Daily  . Chlorhexidine Gluconate Cloth  6 each Topical Q0600  . [START ON 04/21/2020] darbepoetin (ARANESP) injection - NON-DIALYSIS  200 mcg Subcutaneous Q Thu-1800  . diltiazem  60 mg Oral Q8H  . feeding supplement (PROSource TF)  45 mL Per Tube Daily  . free water  150 mL Per Tube Q6H  . heparin  5,000 Units Subcutaneous Q8H  . lactulose  20 g Oral TID  . metoprolol tartrate  100 mg Per Tube BID  . sodium chloride flush  10-40 mL Intracatheter Q12H   Continuous  Infusions: . feeding supplement (OSMOLITE 1.2 CAL) 1,000 mL (04/19/20 0200)  . ferric gluconate (FERRLECIT/NULECIT) IV Stopped (04/15/20 1922)     LOS: 11 days    Time spent: 33 minutes    Barb Merino, MD [facility] Pager [phone]

## 2020-04-19 NOTE — Progress Notes (Addendum)
PICC de-clotting consult: TL PICC with no blood return. Alteplase instilled to red and grey lumen. (White lumen with sluggish blood return.)  Noted pt has TL HD cath (with pigtail central line). RN reported she was told not to touch it. Pigtail with blood in cap. Cap changed, flushed lab drawn from pigtail. Education provided to bedside RN and Agricultural consultant.  Please consider removal of TL PICC if no longer being used.

## 2020-04-19 NOTE — Progress Notes (Signed)
[facility] Progress Note     Assessment/ Plan:   1. CKD5, ESRD  1. After discussions with neph and palliative tentative plan is limited trial of HD 2. Was to start 1/17 but postponed to today 3. Has IJ Temp cath IR placed 1/16 4. Palliative following 5. If mental status does not improve, ongoing dialysis is not reasonable 2. GOC: see palliative and primary notes.  Currently full scope 3. Aspiration PNA per primary 4. Recent COVID19 infection off isolation 5. HTN/Vol: follow with HD 6. Dementia, persistent encepahlopathy, unlikely to improve with HD 7. AFib 8. Anemia, on ESA< Trend. Transfuse per primary. On Fe 9. Leukocytosis    Subjective:     No acute events  Somnolent now  HD postponed to today from yesterday  No family at bedside  Objective:   BP (!) 123/55 (BP Location: Left Arm)   Pulse 72   Temp 98 F (36.7 C) (Axillary)   Resp 17   Ht '5\' 8"'$  (1.727 m)   Wt 97.2 kg   SpO2 98%   BMI 32.58 kg/m  No intake or output data in the 24 hours ending 04/19/20 1310 Weight change: -1.9 kg  Physical Exam: Gen: chronically ill appearing CVS:reg rate Resp:normal wob AN:[id number], soft Ext:+dependent edema Neuro: nonverbal, unresponsive  Imaging: DG Chest Port 1 View  Result Date: 04/17/2020 CLINICAL DATA:  History of COVID positive with low oxygen saturation. EXAM: PORTABLE CHEST 1 VIEW COMPARISON:  April 10, 2020 FINDINGS: The study is limited secondary to patient rotation. A right-sided PICC line is seen with its distal end mildly curved upward within the distal superior vena cava. A right internal jugular venous catheter is noted, with its distal end overlying the junction of the superior vena cava and right atrium. Interval nasogastric feeding tube placement is noted with its distal end overlying the body of the stomach. Mildly hazy appearing bilateral lung bases are seen which is likely positional in nature. There is no definite evidence of acute  infiltrate, pleural effusion or pneumothorax. The cardiac silhouette is markedly enlarged and unchanged in size. The visualized skeletal structures are unremarkable. IMPRESSION: 1. Interval right internal jugular venous catheter and nasogastric feeding tube placement and positioning, as described above. 2. Right-sided PICC line with its distal end mildly curved upward within the distal superior vena cava. 3. Cardiomegaly with suspected positional hazy appearing bilateral lung bases. Mild bibasilar airspace disease cannot be excluded. Electronically Signed   By: Virgina Norfolk M.D.   On: 04/17/2020 17:32    Labs: BMET Recent Labs  Lab 04/13/20 0452 04/14/20 0358 04/15/20 0543 04/16/20 0439 04/17/20 0618 04/18/20 0430 04/19/20 0633  NA 139 142 141 139 139 137 138  K 2.7* 3.8 3.4* 3.8 4.3 4.8 4.4  CL 99 103 105 103 104 103 104  CO2 '27 24 22 24 23 '$ 21* 21*  GLUCOSE 290* 82 134* 122* 117* 135* 144*  BUN 55* 63* 68* 71* 76* 84* 94*  CREATININE 5.12* 5.78* 5.85* 5.85* 5.73* 5.78* 6.11*  CALCIUM 7.1* 8.0* 7.7* 7.5* 7.5* 7.8* 8.1*   CBC Recent Labs  Lab 04/16/20 0439 04/17/20 0618 04/18/20 0430 04/19/20 0633  WBC 12.8* 12.6* 12.3* 18.3*  NEUTROABS  --  9.2* 8.8* 15.4*  HGB 7.7* 7.6* 7.5* 7.2*  HCT 23.8* 24.7* 24.6* 24.4*  MCV 93.3 95.0 95.7 99.2  PLT 163 169 214 227    Medications:    . Chlorhexidine Gluconate Cloth  6 each Topical Daily  . Chlorhexidine Gluconate Cloth  6 each Topical Q0600  . [START ON 04/21/2020] darbepoetin (ARANESP) injection - NON-DIALYSIS  200 mcg Subcutaneous Q Thu-1800  . diltiazem  60 mg Oral Q8H  . feeding supplement (PROSource TF)  45 mL Per Tube Daily  . free water  150 mL Per Tube Q6H  . heparin  5,000 Units Subcutaneous Q8H  . lactulose  20 g Oral TID  . metoprolol tartrate  100 mg Per Tube BID  . sodium chloride flush  10-40 mL Intracatheter Q12H    Rexene Agent, MD  04/19/2020, 1:10 PM

## 2020-04-19 NOTE — Progress Notes (Signed)
Nutrition Follow-up  DOCUMENTATION CODES:   Not applicable  INTERVENTION:  Continue TF via [employer]: -Osmolite 1.2 @ 60 mL/hr (1440 ml/day) -47m ProSource TF daily -1582mfree water Q6H  TF regimen provides 1768 kcal, 91 grams of protein, and 1181 ml of H2O (178170motal free water with flushes).  NUTRITION DIAGNOSIS:   Inadequate oral intake related to lethargy/confusion as evidenced by meal completion < 25%. - ongoing  GOAL:   Patient will meet greater than or equal to 90% of their needs - Met with TF  MONITOR:   TF tolerance,Weight trends,Labs,I & O's  REASON FOR ASSESSMENT:   Consult Enteral/tube feeding initiation and management  ASSESSMENT:   [age]ar old female who presented to the ED on 1/06 with AMS. PMH of anemia, CHF, CKD stage IV, COPD, HLD, HTN, PAD s/p AKA, atrial fibrillation. Pt with recent admission for COVID pneumonia and encephalopathy and was discharged on 04/04/20.   1/12 cortrak placed (gastric tip) 1/13 pt's daughters rescinded DNR 1/16 IJ temporary HD cath placed for HD trial; pt w/ multiple rapid responses  Per MD, pt with no meaningful recovery, multiple medical issues, not a candidate for rehab at this time, and not a long-term dialysis candidate. Cr and mentation continue to worsen. MD notes pt is appropriate for hospice/comfort care. However, pt's daughters have elected to have pt trial HD to see if it will help her improve enough to pursue rehab. Palliative Care is following and plans to readdress GOC with pt's daughters after 3 trial sessions of HD. Pt underwent first session today. Nephrology noted -1.9kg wt change.   Pt noted to be obtunded. Pt is receiving TF via Cortrak and is tolerating per MD. Current TF orders: Osmolite 1.2 @ 60 mL/hr (1440 ml/day), 35m64moSource TF daily, 150ml47me water Q6H.   No UOP documented.   Medications: aranesp, chronulac Labs: Cr 6.11 (H, trending up), CBGs [id number] Order:   Diet Order             Diet NPO time specified  Diet effective now                 EDUCATION NEEDS:   Not appropriate for education at this time  Skin:  Skin Assessment: Skin Integrity Issues: Incisions: closed incision to left leg  Last BM:  04/19/20 type 7 via rectal tube  Height:   Ht Readings from Last 1 Encounters:  04/13/2020 5' 8"  (1.727 m)    Weight:   Wt Readings from Last 1 Encounters:  04/19/20 97.2 kg    BMI:  Body mass index is 32.58 kg/m.  Estimated Nutritional Needs:   Kcal:  [id number]:  90-110 grams  Fluid:  1.7-1.9 L  AmandLarkin Ina RD, LDN RD pager number and weekend/on-call pager number located in [email or web address]

## 2020-04-20 DIAGNOSIS — G9341 Metabolic encephalopathy: Secondary | ICD-10-CM | POA: Diagnosis not present

## 2020-04-20 LAB — BASIC METABOLIC PANEL
Anion gap: 12 (ref 5–15)
BUN: 100 mg/dL — ABNORMAL HIGH (ref 8–23)
CO2: 21 mmol/L — ABNORMAL LOW (ref 22–32)
Calcium: 8.1 mg/dL — ABNORMAL LOW (ref 8.9–10.3)
Chloride: 105 mmol/L (ref 98–111)
Creatinine, Ser: 6.06 mg/dL — ABNORMAL HIGH (ref 0.44–1.00)
GFR, Estimated: 7 mL/min — ABNORMAL LOW (ref >60)
Glucose, Bld: 177 mg/dL — ABNORMAL HIGH (ref 70–99)
Potassium: 4.2 mmol/L (ref 3.5–5.1)
Sodium: 138 mmol/L (ref 135–145)

## 2020-04-20 LAB — CBC WITH DIFFERENTIAL/PLATELET
Abs Immature Granulocytes: 0.38 10*3/uL — ABNORMAL HIGH (ref 0.00–0.07)
Basophils Absolute: 0 10*3/uL (ref 0.0–0.1)
Basophils Relative: 0 %
Eosinophils Absolute: 0.2 10*3/uL (ref 0.0–0.5)
Eosinophils Relative: 1 %
HCT: 23.9 % — ABNORMAL LOW (ref 36.0–46.0)
Hemoglobin: 7.2 g/dL — ABNORMAL LOW (ref 12.0–15.0)
Immature Granulocytes: 2 %
Lymphocytes Relative: 4 %
Lymphs Abs: 0.9 10*3/uL (ref 0.7–4.0)
MCH: 29.3 pg (ref 26.0–34.0)
MCHC: 30.1 g/dL (ref 30.0–36.0)
MCV: 97.2 fL (ref 80.0–100.0)
Monocytes Absolute: 1.4 10*3/uL — ABNORMAL HIGH (ref 0.1–1.0)
Monocytes Relative: 6 %
Neutro Abs: 18.3 10*3/uL — ABNORMAL HIGH (ref 1.7–7.7)
Neutrophils Relative %: 87 %
Platelets: 237 10*3/uL (ref 150–400)
RBC: 2.46 MIL/uL — ABNORMAL LOW (ref 3.87–5.11)
RDW: 19.5 % — ABNORMAL HIGH (ref 11.5–15.5)
WBC: 21.1 10*3/uL — ABNORMAL HIGH (ref 4.0–10.5)
nRBC: 0.1 % (ref 0.0–0.2)

## 2020-04-20 LAB — GLUCOSE, CAPILLARY
Glucose-Capillary: 140 mg/dL — ABNORMAL HIGH (ref 70–99)
Glucose-Capillary: 111 mg/dL — ABNORMAL HIGH (ref 70–99)
Glucose-Capillary: 168 mg/dL — ABNORMAL HIGH (ref 70–99)
Glucose-Capillary: 163 mg/dL — ABNORMAL HIGH (ref 70–99)
Glucose-Capillary: 118 mg/dL — ABNORMAL HIGH (ref 70–99)

## 2020-04-20 LAB — AMMONIA: Ammonia: 67 umol/L — ABNORMAL HIGH (ref 9–35)

## 2020-04-20 LAB — HEPATITIS B SURFACE ANTIGEN: Hepatitis B Surface Ag: NONREACTIVE

## 2020-04-20 MED ORDER — SODIUM CHLORIDE 0.9 % IV SOLN
1.5000 g | Freq: Two times a day (BID) | INTRAVENOUS | Status: DC
  Administered 2020-04-20 – 2020-04-23 (×7): 1.5 g via INTRAVENOUS
  Filled 2020-04-20 (×11): qty 4

## 2020-04-20 NOTE — Plan of Care (Signed)

## 2020-04-20 NOTE — Procedures (Signed)
I was present at this dialysis session. I have reviewed the session itself and made appropriate changes.   HD#1. Palliative notes reviewed.  2K, 1L UF.  Next HD planned 1/21.     Filed Weights   04/19/20 0413 04/20/20 0404 04/20/20 0730  Weight: 97.2 kg 98.8 kg 98.3 kg    Recent Labs  Lab 04/20/20 0506  NA 138  K 4.2  CL 105  CO2 21*  GLUCOSE 177*  BUN 100*  CREATININE 6.06*  CALCIUM 8.1*    Recent Labs  Lab 04/18/20 0430 04/19/20 0633 04/20/20 0506  WBC 12.3* 18.3* 21.1*  NEUTROABS 8.8* 15.4* 18.3*  HGB 7.5* 7.2* 7.2*  HCT 24.6* 24.4* 23.9*  MCV 95.7 99.2 97.2  PLT 214 227 237    Scheduled Meds: . Chlorhexidine Gluconate Cloth  6 each Topical Daily  . Chlorhexidine Gluconate Cloth  6 each Topical Q0600  . [START ON 04/21/2020] darbepoetin (ARANESP) injection - NON-DIALYSIS  200 mcg Subcutaneous Q Thu-1800  . diltiazem  60 mg Oral Q8H  . feeding supplement (PROSource TF)  45 mL Per Tube Daily  . free water  150 mL Per Tube Q6H  . heparin  5,000 Units Subcutaneous Q8H  . metoprolol tartrate  100 mg Per Tube BID  . sodium chloride flush  10-40 mL Intracatheter Q12H   Continuous Infusions: . feeding supplement (OSMOLITE 1.2 CAL) 1,000 mL (04/19/20 2251)  . ferric gluconate (FERRLECIT/NULECIT) IV Stopped (04/15/20 1922)   PRN Meds:.acetaminophen (TYLENOL) oral liquid 160 mg/5 mL, [DISCONTINUED] acetaminophen **OR** acetaminophen, albuterol, bisacodyl, lidocaine, metoprolol tartrate, sodium chloride flush   Pearson Grippe  MD 04/20/2020, 9:28 AM

## 2020-04-20 NOTE — Progress Notes (Addendum)
Received report from [name] stating pt LOC is unresponsive and has been for the past 3 days. Pt is red now and fluctuates from red to yellow.Pt's RR runs between 17-30 periodically and is in A flutter with a history of A fib

## 2020-04-20 NOTE — Progress Notes (Signed)
PROGRESS NOTE    Stephanie Griffith  [id number] DOB: 1945-11-13 DOA: 04/11/2020 PCP: [name], MD    Brief Narrative:  75 year old female with history of chronic diastolic heart failure, chronic kidney disease stage V, history of COPD, hypertension hyperlipidemia, paroxysmal A. fib and pulmonary hypertension with vascular dementia who was recently hospitalized for UTI, COVID-19 pneumonia and had further deterioration of her mental status.  She was discharged home on January 3 on family request.  Came back to emergency room on January 6 with worsening confusion, not eating.  Admitted with metabolic encephalopathy.  Treated for aspiration pneumonia.  Remains persistently encephalopathic. Reportedly patient had declined to have hemodialysis at outpatient follow-ups. 1/13, daughters rescinded DNR, decided to pursue with all treatments.  Tube feeding started. 1/16, temporary HD catheter placed to plan for trial of dialysis. 1/16, patient remains obtunded. tachycardic at times.  Unable to wake up.   Multiple rapid responses.  CT scans with old  Microvascular changes.  Patient with no meaningful recovery, multiple medical issues and not a rehab candidate at this situation. She is appropriate for comfort care and hospice , however her daughters made decision to give her a trial of dialysis. Planned for HD and only able to complete on 1/19.   Assessment & Plan:   Principal Problem:   Acute metabolic encephalopathy Active Problems:   Normocytic anemia   Leukocytosis   PAD (peripheral artery disease) (HCC)   S/P AKA (above knee amputation) (HCC)   Chronic renal insufficiency, stage IV (severe) (HCC)   Metabolic acidosis   Chronic diastolic CHF (congestive heart failure) (HCC)   Encephalopathy due to COVID-19 virus   Dyslipidemia   Acute encephalopathy   Atrial fibrillation with RVR ([place])   Advanced care planning/counseling discussion   Goals of care, counseling/discussion  1. Acute  metabolic encephalopathy, profound encephalopathy with underlying vascular dementia.  Probably exacerbated by recent UTI and COVID-19 infection. Suspect Uremia. Currently on symptomatic treatment.  Remains persistently encephalopathic. Dobbhoff tube feeding, tolerating well. Oxygenation is stable. Palliative care consulted, family decided to go aggressive treatment. EEG showed triphasic morphology, started on Keppra that was stopped because of severe depressed mentation. Uremia might have added to her overall encephalopathy. Patient not a permanent dialysis candidate, however family want to see if she responds to temporary dialysis. If patient responds to temporary dialysis, will pursue rehab. If patient does not respond to temporary dialysis, goal of care discussion will be revisited. Received right IJ catheter 1/16. Ammonia elevated , trying lactulose with some improvement of ammonia level but no improvement of mentation.  2.  Suspected aspiration pneumonia: Completed 5 days of antibiotics. Very high risk of aspiration.   Worsening leukocytosis. Will treat with Unasyn as aspiration pneumonia/community-acquired pneumonia.  3.  Rapid A. fib: Rate variable.  Now rate controlled on metoprolol and Cardizem that we will continue.  4.  Hypokalemia/hypomagnesemia: Replaced and adequate.  5.  CKD stage V: As above.  Creatinine continues to worsen.  Mental status continues to worsen. As above, received first dialysis today.   DVT prophylaxis: heparin injection 5,000 Units Start: 04/08/20 0730   Code Status: Full code Family Communication: Patient's daughter Ms. [name] on the phone. Disposition Plan: Status is: Inpatient  Remains inpatient appropriate because:Inpatient level of care appropriate due to severity of illness   Dispo: The patient is from: Home              Anticipated d/c is to: Unknown.  Doubtfull recovery.  Anticipated d/c date is: > 3 days              Patient  currently is not medically stable to d/c.         Consultants:   Nephrology  Palliative medicine  Procedures:   None  Antimicrobials:   Completed   Subjective: Patient seen and examined.  Seen in dialysis.  Went to see her in the medical floor.  Patient is still obtunded. Patient did wince her eyes on painful stimuli and on moving her arms. Telemetry shows sinus rhythm and rate controlled.  Uneventful hemodialysis.  Objective: Vitals:   04/20/20 0930 04/20/20 0935 04/20/20 0940 04/20/20 1020  BP: 96/66 109/61 114/63 103/61  Pulse: 89 88 76   Resp: 18 (!) 24 16 (!) 24  Temp:   98.4 F (36.9 C) 98.2 F (36.8 C)  TempSrc:   Axillary Axillary  SpO2:  100% 100%   Weight:  97.3 kg    Height:        Intake/Output Summary (Last 24 hours) at 04/20/2020 1151 Last data filed at 04/20/2020 0935 Gross per 24 hour  Intake 2880 ml  Output 1970 ml  Net 910 ml   Filed Weights   04/20/20 0404 04/20/20 0730 04/20/20 0935  Weight: 98.8 kg 98.3 kg 97.3 kg    Examination:  Physical Exam Constitutional:      Comments: Unresponsive and obtunded, sick looking.  HENT:     Head: Normocephalic.  Cardiovascular:     Rate and Rhythm: Normal rate and regular rhythm.     Heart sounds: Normal heart sounds.  Pulmonary:     Comments: Some conducted airway sounds.  Poor air entry at bases.  Currently on 4 L of oxygen. Musculoskeletal:     Cervical back: Neck supple.  Skin:    Comments: Left leg amputation stump clean and dry. Pigmentation and chronic lymphedema right leg. 1+ edema, anasarca both hands.      Data Reviewed: I have personally reviewed following labs and imaging studies  CBC: Recent Labs  Lab 04/16/20 0439 04/17/20 0618 04/18/20 0430 04/19/20 0633 04/20/20 0506  WBC 12.8* 12.6* 12.3* 18.3* 21.1*  NEUTROABS  --  9.2* 8.8* 15.4* 18.3*  HGB 7.7* 7.6* 7.5* 7.2* 7.2*  HCT 23.8* 24.7* 24.6* 24.4* 23.9*  MCV 93.3 95.0 95.7 99.2 97.2  PLT 163 169 214 227 [id number]    Basic Metabolic Panel: Recent Labs  Lab 04/16/20 0439 04/17/20 0618 04/18/20 0430 04/19/20 0633 04/20/20 0506  NA 139 139 137 138 138  K 3.8 4.3 4.8 4.4 4.2  CL 103 104 103 104 105  CO2 24 23 21* 21* 21*  GLUCOSE 122* 117* 135* 144* 177*  BUN 71* 76* 84* 94* 100*  CREATININE 5.85* 5.73* 5.78* 6.11* 6.06*  CALCIUM 7.5* 7.5* 7.8* 8.1* 8.1*  MG 2.3  --   --   --   --    GFR: Estimated Creatinine Clearance: 9.9 mL/min (A) (by C-G formula based on SCr of 6.06 mg/dL (H)). Liver Function Tests: Recent Labs  Lab 04/18/20 0430  AST 33  ALT 19  ALKPHOS 108  BILITOT 0.7  PROT 5.6*  ALBUMIN 1.7*   No results for input(s): LIPASE, AMYLASE in the last 168 hours. Recent Labs  Lab 04/18/20 0520 04/19/20 0633 04/20/20 0506  AMMONIA 118* 103* 67*   Coagulation Profile: No results for input(s): INR, PROTIME in the last 168 hours. Cardiac Enzymes: No results for input(s): CKTOTAL, CKMB, CKMBINDEX, TROPONINI in the  last 168 hours. BNP (last 3 results) No results for input(s): PROBNP in the last 8760 hours. HbA1C: No results for input(s): HGBA1C in the last 72 hours. CBG: Recent Labs  Lab 04/19/20 1223 04/19/20 1547 04/19/20 2013 04/20/20 0641 04/20/20 0914  GLUCAP 139* 129* 116* 140* 111*   Lipid Profile: No results for input(s): CHOL, HDL, LDLCALC, TRIG, CHOLHDL, LDLDIRECT in the last 72 hours. Thyroid Function Tests: No results for input(s): TSH, T4TOTAL, FREET4, T3FREE, THYROIDAB in the last 72 hours. Anemia Panel: No results for input(s): VITAMINB12, FOLATE, FERRITIN, TIBC, IRON, RETICCTPCT in the last 72 hours. Sepsis Labs: No results for input(s): PROCALCITON, LATICACIDVEN in the last 168 hours.  No results found for this or any previous visit (from the past 240 hour(s)).       Radiology Studies: No results found.      Scheduled Meds: . Chlorhexidine Gluconate Cloth  6 each Topical Daily  . Chlorhexidine Gluconate Cloth  6 each Topical Q0600  .  [START ON 04/21/2020] darbepoetin (ARANESP) injection - NON-DIALYSIS  200 mcg Subcutaneous Q Thu-1800  . diltiazem  60 mg Oral Q8H  . feeding supplement (PROSource TF)  45 mL Per Tube Daily  . free water  150 mL Per Tube Q6H  . heparin  5,000 Units Subcutaneous Q8H  . metoprolol tartrate  100 mg Per Tube BID  . sodium chloride flush  10-40 mL Intracatheter Q12H   Continuous Infusions: . feeding supplement (OSMOLITE 1.2 CAL) 1,000 mL (04/19/20 2251)  . ferric gluconate (FERRLECIT/NULECIT) IV Stopped (04/15/20 1922)     LOS: 12 days    Time spent: 30 minutes    Barb Merino, MD [facility] Pager [phone]

## 2020-04-20 NOTE — Progress Notes (Signed)
OT Cancellation Note  Patient Details Name: Stephanie Griffith MRN: [id number] DOB: [birth year]   Cancelled Treatment:    Reason Eval/Treat Not Completed: Patient at procedure or test/ unavailable. Patient off floor for HD. OT to check back as time allows.   Gloris Manchester OTR/L Supplemental OT, Department of rehab services [phone]  Lesly Joslyn R H. 04/20/2020, 9:32 AM

## 2020-04-20 NOTE — Progress Notes (Signed)
Mews score Red at 0000 VS checks d/t  increased RR, which ranged from 17- 30, intermittently, MEWS documentation completed, provider notified, will continue to monitor patient s/s of distress. Currently the LOC remains unresponsive to painful stimuli, as it was when I began the shift. O2 saturation 99% 4/L nasal canula. Lung sounds clear and deminished. Will report to day shift RN.

## 2020-04-21 DIAGNOSIS — G9341 Metabolic encephalopathy: Secondary | ICD-10-CM | POA: Diagnosis not present

## 2020-04-21 LAB — GLUCOSE, CAPILLARY
Glucose-Capillary: 103 mg/dL — ABNORMAL HIGH (ref 70–99)
Glucose-Capillary: 117 mg/dL — ABNORMAL HIGH (ref 70–99)
Glucose-Capillary: 126 mg/dL — ABNORMAL HIGH (ref 70–99)
Glucose-Capillary: 131 mg/dL — ABNORMAL HIGH (ref 70–99)
Glucose-Capillary: 138 mg/dL — ABNORMAL HIGH (ref 70–99)
Glucose-Capillary: 142 mg/dL — ABNORMAL HIGH (ref 70–99)
Glucose-Capillary: 158 mg/dL — ABNORMAL HIGH (ref 70–99)

## 2020-04-21 LAB — CBC WITH DIFFERENTIAL/PLATELET
Abs Immature Granulocytes: 0.33 10*3/uL — ABNORMAL HIGH (ref 0.00–0.07)
Basophils Absolute: 0.1 10*3/uL (ref 0.0–0.1)
Basophils Relative: 0 %
Eosinophils Absolute: 0.3 10*3/uL (ref 0.0–0.5)
Eosinophils Relative: 1 %
HCT: 20.8 % — ABNORMAL LOW (ref 36.0–46.0)
Hemoglobin: 6.5 g/dL — CL (ref 12.0–15.0)
Immature Granulocytes: 2 %
Lymphocytes Relative: 6 %
Lymphs Abs: 1.1 10*3/uL (ref 0.7–4.0)
MCH: 30 pg (ref 26.0–34.0)
MCHC: 31.3 g/dL (ref 30.0–36.0)
MCV: 95.9 fL (ref 80.0–100.0)
Monocytes Absolute: 1.6 10*3/uL — ABNORMAL HIGH (ref 0.1–1.0)
Monocytes Relative: 9 %
Neutro Abs: 14.6 10*3/uL — ABNORMAL HIGH (ref 1.7–7.7)
Neutrophils Relative %: 82 %
Platelets: 186 10*3/uL (ref 150–400)
RBC: 2.17 MIL/uL — ABNORMAL LOW (ref 3.87–5.11)
RDW: 19.8 % — ABNORMAL HIGH (ref 11.5–15.5)
WBC: 18 10*3/uL — ABNORMAL HIGH (ref 4.0–10.5)
nRBC: 0.2 % (ref 0.0–0.2)

## 2020-04-21 LAB — BASIC METABOLIC PANEL
Anion gap: 13 (ref 5–15)
BUN: 84 mg/dL — ABNORMAL HIGH (ref 8–23)
CO2: 21 mmol/L — ABNORMAL LOW (ref 22–32)
Calcium: 7.8 mg/dL — ABNORMAL LOW (ref 8.9–10.3)
Chloride: 102 mmol/L (ref 98–111)
Creatinine, Ser: 5.2 mg/dL — ABNORMAL HIGH (ref 0.44–1.00)
GFR, Estimated: 8 mL/min — ABNORMAL LOW (ref >60)
Glucose, Bld: 124 mg/dL — ABNORMAL HIGH (ref 70–99)
Potassium: 4.2 mmol/L (ref 3.5–5.1)
Sodium: 136 mmol/L (ref 135–145)

## 2020-04-21 LAB — HEPATITIS B SURFACE ANTIBODY, QUANTITATIVE: Hep B S AB Quant (Post): 126 m[IU]/mL (ref >9.9)

## 2020-04-21 LAB — PREPARE RBC (CROSSMATCH)

## 2020-04-21 MED ORDER — LACTULOSE 10 GM/15ML PO SOLN
20.0000 g | Freq: Two times a day (BID) | ORAL | Status: DC
  Administered 2020-04-21 – 2020-04-23 (×5): 20 g
  Filled 2020-04-21 (×4): qty 30

## 2020-04-21 MED ORDER — SODIUM CHLORIDE 0.9% IV SOLUTION: Freq: Once | INTRAVENOUS | Status: AC

## 2020-04-21 MED ORDER — LACTULOSE 10 GM/15ML PO SOLN
20.0000 g | Freq: Two times a day (BID) | ORAL | Status: DC
  Administered 2020-04-21: 20 g via ORAL
  Filled 2020-04-21 (×2): qty 30

## 2020-04-21 MED ORDER — DILTIAZEM 12 MG/ML ORAL SUSPENSION
60.0000 mg | Freq: Three times a day (TID) | ORAL | Status: DC
  Administered 2020-04-21 – 2020-04-22 (×4): 60 mg
  Filled 2020-04-21 (×6): qty 6

## 2020-04-21 NOTE — Progress Notes (Signed)
PROGRESS NOTE    Stephanie Griffith  [id number] DOB: 1945-11-12 DOA: 04/22/2020 PCP: [name], MD    Brief Narrative:  75 year old female with history of chronic diastolic heart failure, chronic kidney disease stage V, history of COPD, hypertension, hyperlipidemia, paroxysmal A. fib and pulmonary hypertension with vascular dementia who was recently hospitalized for UTI, COVID-19 pneumonia and had further deterioration of her mental status.  She was discharged home on January 3.  Came back to emergency room on January 6 with worsening confusion, not eating.  Admitted with metabolic encephalopathy.  Treated for aspiration pneumonia.  Remains persistently encephalopathic. Reportedly patient had declined to have hemodialysis at outpatient follow-ups. 1/13, daughters rescinded DNR, decided to pursue with all treatments.  Tube feeding started. 1/16, temporary HD catheter placed to plan for trial of dialysis. 1/16, patient remains obtunded. tachycardic at times.  Unable to wake up.   Multiple rapid responses.  CT scans with old  Microvascular changes.  Patient with no meaningful recovery, multiple medical issues and not a rehab candidate at this situation. She is appropriate for comfort care and hospice , however her daughters made decision to give her a trial of dialysis.  Received first dialysis session on 1/19.    Assessment & Plan:   Principal Problem:   Acute metabolic encephalopathy Active Problems:   Normocytic anemia   Leukocytosis   PAD (peripheral artery disease) (HCC)   S/P AKA (above knee amputation) (HCC)   Chronic renal insufficiency, stage IV (severe) (HCC)   Metabolic acidosis   Chronic diastolic CHF (congestive heart failure) (HCC)   Encephalopathy due to COVID-19 virus   Dyslipidemia   Acute encephalopathy   Atrial fibrillation with RVR ([place])   Advanced care planning/counseling discussion   Goals of care, counseling/discussion  1. Acute metabolic encephalopathy,  profound encephalopathy with underlying vascular dementia.  Probably exacerbated by recent UTI and COVID-19 infection. Suspect Uremia. Currently on symptomatic treatment.  Remains persistently encephalopathic. Dobbhoff tube feeding, tolerating well. Oxygenation is stable. Palliative care consulted, family decided to go aggressive treatment. EEG showed triphasic morphology, started on Keppra that was stopped because of severe depressed mentation. Patient not a permanent dialysis candidate, however family want to see if she responds to temporary dialysis. If patient responds to temporary dialysis, will pursue rehab. If patient does not respond to temporary dialysis, goal of care discussion will be revisited. Received right IJ catheter 1/16. Ammonia elevated , trying lactulose with some improvement of ammonia level but no improvement of mentation. Resume lactulose today. Dialysis on 1/19.  Next dialysis planned for 1/21.  2.  Suspected aspiration pneumonia: Completed 5 days of antibiotics. Very high risk of aspiration.   Worsening leukocytosis.  Started on Unasyn.  We will continue.  She is unable to participate in any chest.  Therapy.  3.  Rapid A. fib: Rate variable.  Now rate controlled on metoprolol and Cardizem that we will continue.  4.  Hypokalemia/hypomagnesemia: Replaced and adequate.  5.  CKD stage V: As above.  Creatinine continues to worsen.  Mental status continues to worsen. As above, received first dialysis 1/19.  Followed by nephrology.  6. anemia of chronic disease: Chronic anemia with worsening due to acute critical illness. Hemoglobin less than 7 today.  Transfuse 1 unit of PRBC.  We will recheck tomorrow morning. Unable to reach patient's daughter [name], consented verbally from 2 other daughters and granddaughter at the bedside.   DVT prophylaxis: heparin injection 5,000 Units Start: 04/08/20 0730   Code Status: Full  code Family Communication: Patient's 2 daughters  on the FaceTime, granddaughter at bedside. Called patient's daughter [name] x2, unable to reach. Disposition Plan: Status is: Inpatient  Remains inpatient appropriate because:Inpatient level of care appropriate due to severity of illness   Dispo: The patient is from: Home              Anticipated d/c is to: Unknown.  Doubtfull recovery.              Anticipated d/c date is: > 3 days              Patient currently is not medically stable to d/c.         Consultants:   Nephrology  Palliative medicine  Procedures:   None  Antimicrobials:   Unasyn, 1/19---   Subjective: Patient seen and examined.  Remains about the same.  Does not respond.  Granddaughter at the bedside.  Objective: Vitals:   04/21/20 0832 04/21/20 0930 04/21/20 0954 04/21/20 1133  BP: 111/60 121/72 130/80 110/65  Pulse: (!) 108 (!) 106 (!) 105 99  Resp:  '18 18 18  '$ Temp: 98.7 F (37.1 C) 98.6 F (37 C) 98.6 F (37 C) 97.7 F (36.5 C)  TempSrc: Axillary Oral Oral Oral  SpO2: 99% 98% 98% 99%  Weight:      Height:        Intake/Output Summary (Last 24 hours) at 04/21/2020 1340 Last data filed at 04/20/2020 1800 Gross per 24 hour  Intake 1249 ml  Output 275 ml  Net 974 ml   Filed Weights   04/20/20 0404 04/20/20 0730 04/20/20 0935  Weight: 98.8 kg 98.3 kg 97.3 kg    Examination:  Physical Exam Constitutional:      Comments: Unresponsive and obtunded, sick looking. Patient groans on painful stimuli and deep palpation of the belly.  Does not respond at all.  HENT:     Head: Normocephalic.  Cardiovascular:     Rate and Rhythm: Normal rate and regular rhythm.     Heart sounds: Normal heart sounds.  Pulmonary:     Comments: Some conducted airway sounds.  Poor air entry at bases.  Currently on 4 L of oxygen. Musculoskeletal:     Cervical back: Neck supple.  Skin:    Comments: Left leg amputation stump clean and dry. Pigmentation and chronic lymphedema right leg. 1+ edema, anasarca both  hands.      Data Reviewed: I have personally reviewed following labs and imaging studies  CBC: Recent Labs  Lab 04/17/20 0618 04/18/20 0430 04/19/20 0633 04/20/20 0506 04/21/20 0515  WBC 12.6* 12.3* 18.3* 21.1* 18.0*  NEUTROABS 9.2* 8.8* 15.4* 18.3* 14.6*  HGB 7.6* 7.5* 7.2* 7.2* 6.5*  HCT 24.7* 24.6* 24.4* 23.9* 20.8*  MCV 95.0 95.7 99.2 97.2 95.9  PLT 169 214 227 237 [id number]   Basic Metabolic Panel: Recent Labs  Lab 04/16/20 0439 04/17/20 0618 04/18/20 0430 04/19/20 0633 04/20/20 0506 04/21/20 0515  NA 139 139 137 138 138 136  K 3.8 4.3 4.8 4.4 4.2 4.2  CL 103 104 103 104 105 102  CO2 24 23 21* 21* 21* 21*  GLUCOSE 122* 117* 135* 144* 177* 124*  BUN 71* 76* 84* 94* 100* 84*  CREATININE 5.85* 5.73* 5.78* 6.11* 6.06* 5.20*  CALCIUM 7.5* 7.5* 7.8* 8.1* 8.1* 7.8*  MG 2.3  --   --   --   --   --    GFR: Estimated Creatinine Clearance: 11.6 mL/min (A) (by C-G formula  based on SCr of 5.2 mg/dL (H)). Liver Function Tests: Recent Labs  Lab 04/18/20 0430  AST 33  ALT 19  ALKPHOS 108  BILITOT 0.7  PROT 5.6*  ALBUMIN 1.7*   No results for input(s): LIPASE, AMYLASE in the last 168 hours. Recent Labs  Lab 04/18/20 0520 04/19/20 0633 04/20/20 0506  AMMONIA 118* 103* 67*   Coagulation Profile: No results for input(s): INR, PROTIME in the last 168 hours. Cardiac Enzymes: No results for input(s): CKTOTAL, CKMB, CKMBINDEX, TROPONINI in the last 168 hours. BNP (last 3 results) No results for input(s): PROBNP in the last 8760 hours. HbA1C: No results for input(s): HGBA1C in the last 72 hours. CBG: Recent Labs  Lab 04/20/20 2004 04/20/20 2326 04/21/20 0340 04/21/20 0831 04/21/20 1218  GLUCAP 118* 103* 117* 126* 131*   Lipid Profile: No results for input(s): CHOL, HDL, LDLCALC, TRIG, CHOLHDL, LDLDIRECT in the last 72 hours. Thyroid Function Tests: No results for input(s): TSH, T4TOTAL, FREET4, T3FREE, THYROIDAB in the last 72 hours. Anemia Panel: No results  for input(s): VITAMINB12, FOLATE, FERRITIN, TIBC, IRON, RETICCTPCT in the last 72 hours. Sepsis Labs: No results for input(s): PROCALCITON, LATICACIDVEN in the last 168 hours.  No results found for this or any previous visit (from the past 240 hour(s)).       Radiology Studies: No results found.      Scheduled Meds: . Chlorhexidine Gluconate Cloth  6 each Topical Daily  . Chlorhexidine Gluconate Cloth  6 each Topical Q0600  . darbepoetin (ARANESP) injection - NON-DIALYSIS  200 mcg Subcutaneous Q Thu-1800  . diltiazem  60 mg Oral Q8H  . feeding supplement (PROSource TF)  45 mL Per Tube Daily  . free water  150 mL Per Tube Q6H  . heparin  5,000 Units Subcutaneous Q8H  . lactulose  20 g Oral BID  . metoprolol tartrate  100 mg Per Tube BID  . sodium chloride flush  10-40 mL Intracatheter Q12H   Continuous Infusions: . ampicillin-sulbactam (UNASYN) IV 1.5 g (04/21/20 0133)  . feeding supplement (OSMOLITE 1.2 CAL) 1,000 mL (04/20/20 2100)  . ferric gluconate (FERRLECIT/NULECIT) IV Stopped (04/15/20 1922)     LOS: 13 days    Time spent: 30 minutes    Barb Merino, MD [facility] Pager [phone]

## 2020-04-21 NOTE — Progress Notes (Signed)
CRITICAL VALUE STICKER  CRITICAL VALUE: HGB 6.5  RECEIVER: Tamala Julian, RN  DATE & TIME NOTIFIED: 04/21/2020 0600  MESSENGER (representative from lab):  MD NOTIFIED: [place]: 04/21/20 [id number]  RESPONSE: AWAITING ORDERS

## 2020-04-21 NOTE — Progress Notes (Signed)
  [facility] Progress Note     Assessment/ Plan:   1. CKD5, ESRD  1. After discussions with neph and palliative tentative plan is limited trial of HD 2. HD#1 1/19, plan remaining Tx on MWF basis 3h 1L UF, 2K, 350/600, no heparin 3. Has IJ Temp cath IR placed 1/16 4. Palliative following 5. If mental status does not improve, ongoing dialysis is not reasonable 2. GOC: see palliative and primary notes.  Currently full scope 3. Aspiration PNA per primary 4. Recent COVID19 infection off isolation 5. HTN/Vol: follow with HD 6. Dementia, persistent encepahlopathy, unlikely to improve with HD 7. AFib 8. Anemia, on ESA< Trend. Transfuse per primary. On Fe 9. Leukocytosis    Subjective:     HD#1 yesterday, tolerated with 1 L ultrafiltration  Mental status remains unchanged  Granddaughter at bedside, updated  No acute events   Objective:   BP 110/65 (BP Location: Left Arm)   Pulse 99   Temp 97.7 F (36.5 C) (Oral)   Resp 18   Ht '5\' 8"'$  (1.727 m)   Wt 97.3 kg   SpO2 99%   BMI 32.62 kg/m   Intake/Output Summary (Last 24 hours) at 04/21/2020 1236 Last data filed at 04/20/2020 1800 Gross per 24 hour  Intake 1249 ml  Output 275 ml  Net 974 ml   Weight change: -0.5 kg  Physical Exam: Gen: chronically ill appearing CVS:reg rate Resp:normal wob [id number], soft Ext:+dependent edema Neuro: nonverbal, unresponsive  Imaging: No results found.  Labs: BMET Recent Labs  Lab 04/15/20 0543 04/16/20 0439 04/17/20 0618 04/18/20 0430 04/19/20 0633 04/20/20 0506 04/21/20 0515  NA 141 139 139 137 138 138 136  K 3.4* 3.8 4.3 4.8 4.4 4.2 4.2  CL 105 103 104 103 104 105 102  CO2 '22 24 23 '$ 21* 21* 21* 21*  GLUCOSE 134* 122* 117* 135* 144* 177* 124*  BUN 68* 71* 76* 84* 94* 100* 84*  CREATININE 5.85* 5.85* 5.73* 5.78* 6.11* 6.06* 5.20*  CALCIUM 7.7* 7.5* 7.5* 7.8* 8.1* 8.1* 7.8*   CBC Recent Labs  Lab 04/18/20 0430 04/19/20 0633 04/20/20 0506  04/21/20 0515  WBC 12.3* 18.3* 21.1* 18.0*  NEUTROABS 8.8* 15.4* 18.3* 14.6*  HGB 7.5* 7.2* 7.2* 6.5*  HCT 24.6* 24.4* 23.9* 20.8*  MCV 95.7 99.2 97.2 95.9  PLT 214 227 237 186    Medications:    . Chlorhexidine Gluconate Cloth  6 each Topical Daily  . Chlorhexidine Gluconate Cloth  6 each Topical Q0600  . darbepoetin (ARANESP) injection - NON-DIALYSIS  200 mcg Subcutaneous Q Thu-1800  . diltiazem  60 mg Oral Q8H  . feeding supplement (PROSource TF)  45 mL Per Tube Daily  . free water  150 mL Per Tube Q6H  . heparin  5,000 Units Subcutaneous Q8H  . lactulose  20 g Oral BID  . metoprolol tartrate  100 mg Per Tube BID  . sodium chloride flush  10-40 mL Intracatheter Q12H    Rexene Agent, MD  04/21/2020, 12:36 PM

## 2020-04-21 NOTE — Progress Notes (Signed)
Per palliative care the family is requesting LTACH. CM has updated the MD and he states she is not medically ready.  TOC following.

## 2020-04-21 NOTE — Progress Notes (Signed)
OT Cancellation Note  Patient Details Name: Stephanie Griffith MRN: [id number] DOB: April 08, 1945   Cancelled Treatment:    Reason Eval/Treat Not Completed: Medical issues which prohibited therapy. Hgb 6.5. OT to hold at this time 2/2 therapy protocol.   Gloris Manchester OTR/L Supplemental OT, Department of rehab services [phone]  Brandii Lakey R H. 04/21/2020, 11:01 AM

## 2020-04-21 NOTE — Progress Notes (Signed)
SLP Cancellation Note  Patient Details Name: RASHAY BENITES MRN: [id number] DOB: June 22, 1945   Cancelled treatment:       Reason Eval/Treat Not Completed: Patient's level of consciousness. No indication of improving mental status in chart. Still following at a distance for any change.  Herbie [place], MA CCC-SLP  Acute Rehabilitation Services Pager [phone] Office [phone]  Lynann Beaver 04/21/2020, 7:40 AM

## 2020-04-21 NOTE — Progress Notes (Signed)
Called by RN. Pt with Hgb of 6.5 this am, down from 7.2 Check fecal occult blood Ordered type and screen. Transfuse one unit PRBC and recheck Hgb/Hct after transfusion

## 2020-04-21 NOTE — Progress Notes (Signed)
Physical Therapy Treatment Patient Details Name: Stephanie Griffith MRN: [id number] DOB: 07/14/1945 Today's Date: 04/21/2020    History of Present Illness Pt is a 75 y.o. female with a medical hx significant for pulmonary HTN, L AKA, PAT, PAF, PAD, HTN, hyperglycemia, COPD, chronic renal insufficiency stage IV, CHF, and anemia who presents with increased confusion and decreased Po instake since her ecent admission for COVID pneumonia and encephalopathy (D/C'd 04/04/20). Suspect underlying dementia with sundowning. Pt admitted for acute encephalopathy due to COVID-19. EEG showed: generalized periodic discharges with triphasic morphology at '2hz'$  which is on the ictal-interictal continuum and can sometimes be seen with toxic-metabolic etiology, moderate to severe diffuse encephalopathy with nonspecific etiology, and no seizures were seen. 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 Palliative consult 04/14/20 with family wanting full code, dialysis initiated and transfer to [facility]    PT Comments    Pt limited by level of arousal. Pt is unable to follow commands and PT does not observe any AROM this session. Pt remains totalA for all mobility. Pt does groan some with sitting at the edge of bed and when family speaks to her, but she remains somnolent throughout session. PT provided education on HEP for family-led PROM to maintain the pt's current ROM and to aide in arousing the pt intermittently. Pt will benefit from continued acute PT POC in an attempt to arouse and reduce caregiver burden. If the pt remains unable to participate in sessions acute PT may sign off. PT continues to recommend LTACH placement at this time.  Follow Up Recommendations  Supervision/Assistance - 24 hour;LTACH     Equipment Recommendations  Hospital bed (mechanical lift)    Recommendations for Other Services       Precautions / Restrictions Precautions Precautions:  Fall Precaution Comments: L AKA, prosthesis no longer fits has not worn for a long time.[name] Restrictions Weight Bearing Restrictions: No    Mobility  Bed Mobility Overal bed mobility: Needs Assistance Bed Mobility: Rolling;Supine to Sit;Sit to Supine Rolling: Total assist;+2 for physical assistance   Supine to sit: Total assist;HOB elevated;+2 for physical assistance Sit to supine: Total assist;+2 for physical assistance;HOB elevated      Transfers                    Ambulation/Gait                 Stairs             Wheelchair Mobility    Modified Rankin (Stroke Patients Only)       Balance Overall balance assessment: Needs assistance Sitting-balance support: No upper extremity supported;Feet supported Sitting balance-Leahy Scale: Zero Sitting balance - Comments: totalA, posterior lean Postural control: Posterior lean                                  Cognition Arousal/Alertness: Lethargic Behavior During Therapy: Flat affect Overall Cognitive Status: Difficult to assess                                 General Comments: pt only briefly opens L eye partially near end of session, does not follow commands or produce any meaningful conversation. PT does not note any active movement of extremities although daughter does reports seeing some minimal AROM of LUE during session  Exercises Other Exercises Other Exercises: PT provides instruction on PROM for UE shoulder flexion/extension, elbow flexion extension, wrist flex/ext, and digit flex/ext, as well as hip flex/ext, knee flex/ext, and ankle PF stretch    General Comments General comments (skin integrity, edema, etc.): VSS, pt on 2L [place]      Pertinent Vitals/Pain Pain Assessment: Faces Faces Pain Scale: Hurts a little bit Pain Location: generalized with mobility Pain Descriptors / Indicators: Moaning Pain Intervention(s): Monitored during session     Home Living                      Prior Function            PT Goals (current goals can now be found in the care plan section) Acute Rehab PT Goals Patient Stated Goal: Unable to state PT Goal Formulation: Patient unable to participate in goal setting (family goal to improve arousal) Time For Goal Achievement: 05/05/20 Potential to Achieve Goals: Poor Progress towards PT goals: Not progressing toward goals - comment (totalA and unable to follow commands, no AROM noted)    Frequency    Min 2X/week      PT Plan Current plan remains appropriate    Co-evaluation              AM-PAC PT "6 Clicks" Mobility   Outcome Measure  Help needed turning from your back to your side while in a flat bed without using bedrails?: Total Help needed moving from lying on your back to sitting on the side of a flat bed without using bedrails?: Total Help needed moving to and from a bed to a chair (including a wheelchair)?: Total Help needed standing up from a chair using your arms (e.g., wheelchair or bedside chair)?: Total Help needed to walk in hospital room?: Total Help needed climbing 3-5 steps with a railing? : Total 6 Click Score: 6    End of Session Equipment Utilized During Treatment: Oxygen Activity Tolerance: Patient limited by lethargy Patient left: in bed;with call bell/phone within reach;with bed alarm set;with family/visitor present Nurse Communication: Mobility status;Need for lift equipment PT Visit Diagnosis: Muscle weakness (generalized) (M62.81)     Time: [id number] PT Time Calculation (min) (ACUTE ONLY): 28 min  Charges:  $Therapeutic Activity: 23-37 mins                     Zenaida Niece, PT, DPT Acute Rehabilitation Pager: [phone]    Zenaida Niece 04/21/2020, 5:25 PM

## 2020-04-22 ENCOUNTER — Inpatient Hospital Stay (HOSPITAL_COMMUNITY): Payer: Medicare Other

## 2020-04-22 DIAGNOSIS — G9341 Metabolic encephalopathy: Secondary | ICD-10-CM | POA: Diagnosis not present

## 2020-04-22 DIAGNOSIS — J9601 Acute respiratory failure with hypoxia: Secondary | ICD-10-CM | POA: Diagnosis not present

## 2020-04-22 DIAGNOSIS — I5031 Acute diastolic (congestive) heart failure: Secondary | ICD-10-CM | POA: Diagnosis not present

## 2020-04-22 DIAGNOSIS — I4891 Unspecified atrial fibrillation: Secondary | ICD-10-CM | POA: Diagnosis not present

## 2020-04-22 DIAGNOSIS — J9602 Acute respiratory failure with hypercapnia: Secondary | ICD-10-CM | POA: Diagnosis not present

## 2020-04-22 LAB — BLOOD GAS, ARTERIAL
Acid-base deficit: 7.6 mmol/L — ABNORMAL HIGH (ref 0.0–2.0)
Bicarbonate: 18.7 mmol/L — ABNORMAL LOW (ref 20.0–28.0)
FIO2: 80
O2 Saturation: 98.4 %
Patient temperature: 37.7
pCO2 arterial: 48.2 mmHg — ABNORMAL HIGH (ref 32.0–48.0)
pH, Arterial: 7.217 — ABNORMAL LOW (ref 7.350–7.450)
pO2, Arterial: 151 mmHg — ABNORMAL HIGH (ref 83.0–108.0)

## 2020-04-22 LAB — AMMONIA: Ammonia: 78 umol/L — ABNORMAL HIGH (ref 9–35)

## 2020-04-22 LAB — GLUCOSE, CAPILLARY
Glucose-Capillary: 133 mg/dL — ABNORMAL HIGH (ref 70–99)
Glucose-Capillary: 150 mg/dL — ABNORMAL HIGH (ref 70–99)
Glucose-Capillary: 150 mg/dL — ABNORMAL HIGH (ref 70–99)
Glucose-Capillary: 150 mg/dL — ABNORMAL HIGH (ref 70–99)
Glucose-Capillary: 152 mg/dL — ABNORMAL HIGH (ref 70–99)
Glucose-Capillary: 89 mg/dL (ref 70–99)

## 2020-04-22 LAB — CBC WITH DIFFERENTIAL/PLATELET
Abs Immature Granulocytes: 0.56 10*3/uL — ABNORMAL HIGH (ref 0.00–0.07)
Basophils Absolute: 0 10*3/uL (ref 0.0–0.1)
Basophils Relative: 0 %
Eosinophils Absolute: 0.3 10*3/uL (ref 0.0–0.5)
Eosinophils Relative: 2 %
HCT: 24.9 % — ABNORMAL LOW (ref 36.0–46.0)
Hemoglobin: 8 g/dL — ABNORMAL LOW (ref 12.0–15.0)
Immature Granulocytes: 4 %
Lymphocytes Relative: 6 %
Lymphs Abs: 0.9 10*3/uL (ref 0.7–4.0)
MCH: 30.1 pg (ref 26.0–34.0)
MCHC: 32.1 g/dL (ref 30.0–36.0)
MCV: 93.6 fL (ref 80.0–100.0)
Monocytes Absolute: 1.6 10*3/uL — ABNORMAL HIGH (ref 0.1–1.0)
Monocytes Relative: 11 %
Neutro Abs: 11.3 10*3/uL — ABNORMAL HIGH (ref 1.7–7.7)
Neutrophils Relative %: 77 %
Platelets: 249 10*3/uL (ref 150–400)
RBC: 2.66 MIL/uL — ABNORMAL LOW (ref 3.87–5.11)
RDW: 19.9 % — ABNORMAL HIGH (ref 11.5–15.5)
WBC: 14.6 10*3/uL — ABNORMAL HIGH (ref 4.0–10.5)
nRBC: 0.1 % (ref 0.0–0.2)

## 2020-04-22 LAB — TYPE AND SCREEN
ABO/RH(D): B POS
Antibody Screen: NEGATIVE
Unit division: 0

## 2020-04-22 LAB — BPAM RBC
Blood Product Expiration Date: 202201292359
ISSUE DATE / TIME: 202201200931
Unit Type and Rh: 7300

## 2020-04-22 LAB — BASIC METABOLIC PANEL
Anion gap: 14 (ref 5–15)
BUN: 92 mg/dL — ABNORMAL HIGH (ref 8–23)
CO2: 20 mmol/L — ABNORMAL LOW (ref 22–32)
Calcium: 7.8 mg/dL — ABNORMAL LOW (ref 8.9–10.3)
Chloride: 102 mmol/L (ref 98–111)
Creatinine, Ser: 5.44 mg/dL — ABNORMAL HIGH (ref 0.44–1.00)
GFR, Estimated: 8 mL/min — ABNORMAL LOW (ref >60)
Glucose, Bld: 154 mg/dL — ABNORMAL HIGH (ref 70–99)
Potassium: 4.2 mmol/L (ref 3.5–5.1)
Sodium: 136 mmol/L (ref 135–145)

## 2020-04-22 LAB — D-DIMER, QUANTITATIVE: D-Dimer, Quant: 19.4 ug/mL-FEU — ABNORMAL HIGH (ref 0.00–0.50)

## 2020-04-22 LAB — CBC
HCT: 26.6 % — ABNORMAL LOW (ref 36.0–46.0)
Hemoglobin: 8.1 g/dL — ABNORMAL LOW (ref 12.0–15.0)
MCH: 29.6 pg (ref 26.0–34.0)
MCHC: 30.5 g/dL (ref 30.0–36.0)
MCV: 97.1 fL (ref 80.0–100.0)
Platelets: 276 10*3/uL (ref 150–400)
RBC: 2.74 MIL/uL — ABNORMAL LOW (ref 3.87–5.11)
RDW: 19.9 % — ABNORMAL HIGH (ref 11.5–15.5)
WBC: 13.4 10*3/uL — ABNORMAL HIGH (ref 4.0–10.5)
nRBC: 0.3 % — ABNORMAL HIGH (ref 0.0–0.2)

## 2020-04-22 IMAGING — DX DG CHEST 1V PORT
1 series · 1 of 1 positions shown · non-contrast
Comparison: [DATE]

CLINICAL DATA: Acute respiratory distress

EXAM:
PORTABLE CHEST 1 VIEW

[chest ap]
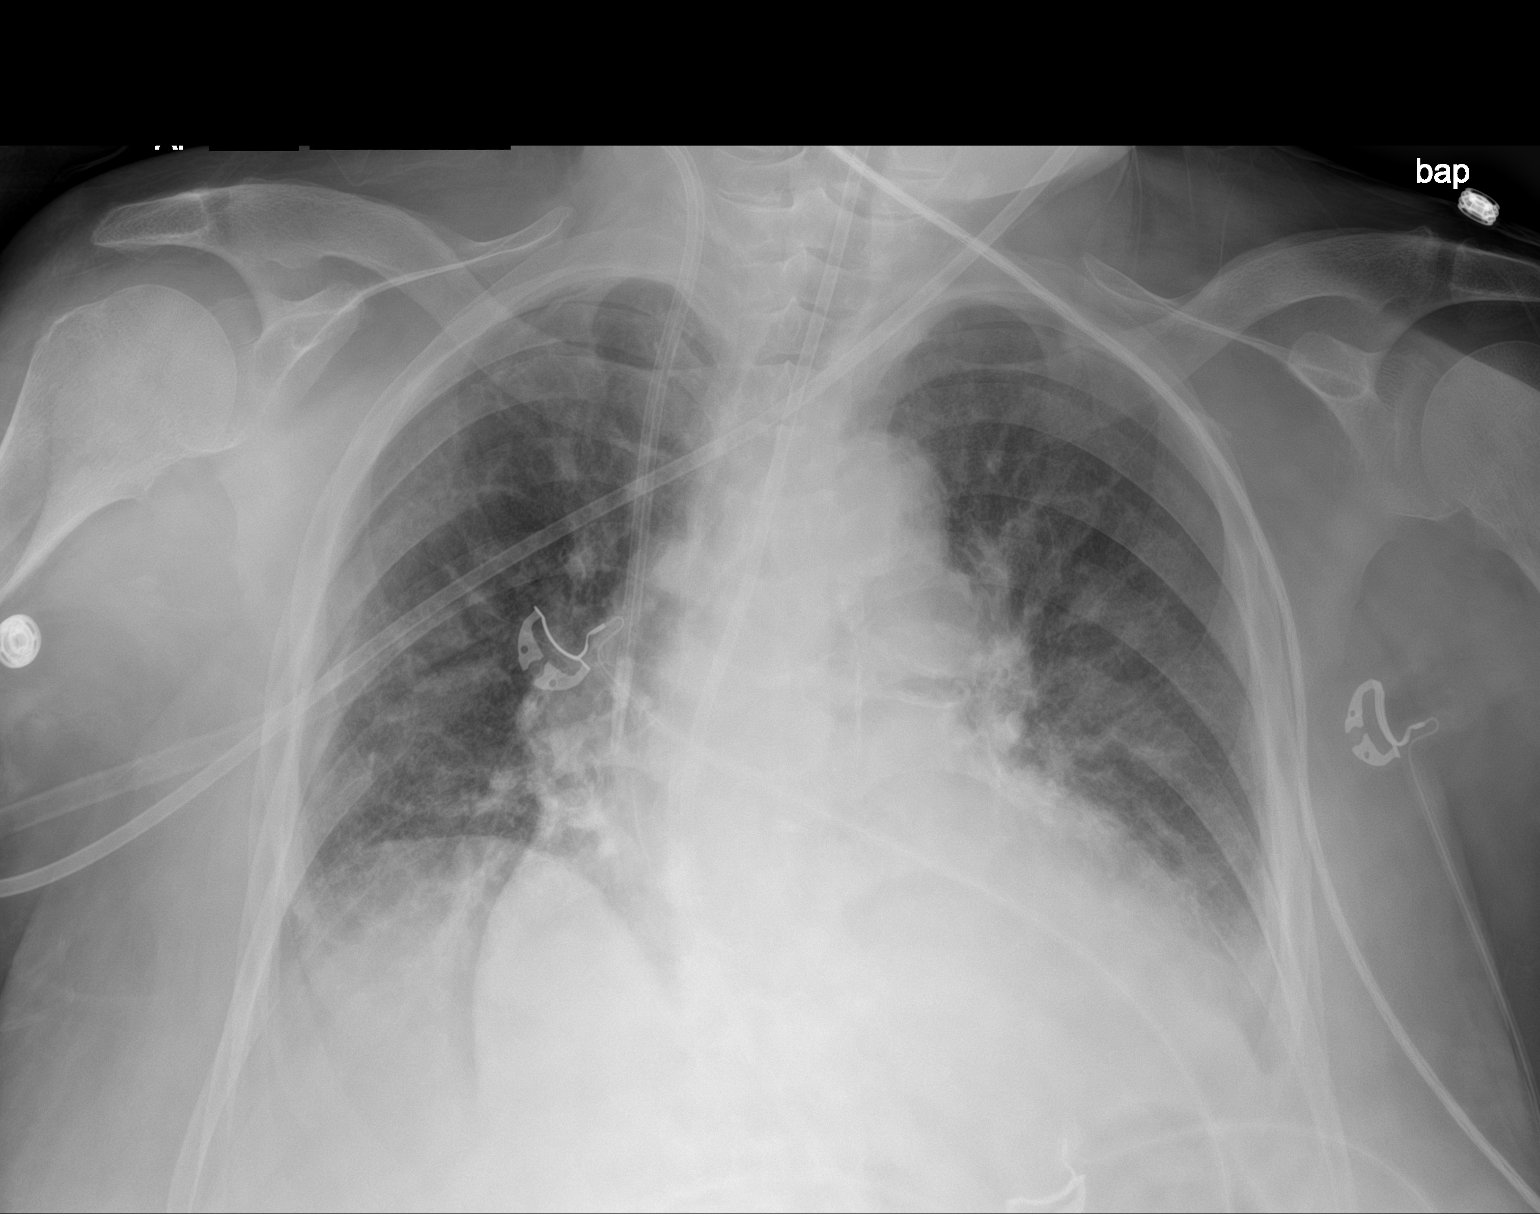

[1 of 1 positions shown; findings below may reference images not displayed]

FINDINGS: Nasoenteric feeding tube extends into the upper abdomen beyond the
margin of the examination. Right internal jugular hemodialysis
catheter is seen with its tip within the superior vena cava.

Lung volumes are small, however, are symmetric and are stable when
compared to prior examination. No pneumothorax. Tiny left pleural
effusion is unchanged. Moderate cardiomegaly is stable. Superimposed
mild interstitial pulmonary edema persists, slightly improved when
compared to prior examination.
IMPRESSION: Improving pulmonary edema, likely cardiogenic in nature.

Stable moderate cardiomegaly.

Stable support lines and tubes.

## 2020-04-22 MED ORDER — FUROSEMIDE 10 MG/ML IJ SOLN
INTRAMUSCULAR | Status: AC
  Administered 2020-04-22: 40 mg via INTRAVENOUS
  Filled 2020-04-22: qty 4

## 2020-04-22 MED ORDER — FUROSEMIDE 10 MG/ML IJ SOLN: 40.0000 mg | Freq: Once | INTRAMUSCULAR | Status: AC

## 2020-04-22 MED ORDER — SODIUM BICARBONATE 8.4 % IV SOLN: 50.0000 meq | Freq: Once | INTRAVENOUS | Status: AC

## 2020-04-22 MED ORDER — HEPARIN SODIUM (PORCINE) 1000 UNIT/ML IJ SOLN
INTRAMUSCULAR | Status: AC
  Filled 2020-04-22: qty 4

## 2020-04-22 MED ORDER — SODIUM BICARBONATE 8.4 % IV SOLN
INTRAVENOUS | Status: AC
  Administered 2020-04-22: 50 meq via INTRAVENOUS
  Filled 2020-04-22: qty 50

## 2020-04-22 MED ORDER — SODIUM BICARBONATE 8.4 % IV SOLN: 50.0000 meq | Freq: Once | INTRAVENOUS | Status: DC

## 2020-04-22 NOTE — Significant Event (Signed)
Rapid Response Event Note   Reason for Call :  Called by HD RN for unresponsiveness. Pt came from [facility] for HD. HD RN not comfortable placing pt on HD d/t unresponsiveness.  Initial Focused Assessment:  Pt lying in bed with eyes closed. Pt will only move RLE to painful stimuli(per chart, this is not new). Pt is using abd muscles to breathe. Pt with congested breathing and weak cough, lungs coarse/rhonchi. Skin cool to touch.  T-99.9, HR-110, BP-130/69, RR-33, SpO2-100% on NRB  Interventions:  ABG-7.21/48.2/151/18.7 PCXR-Improving pulmonary edema, likely cardiogenic in nature. Stable moderate cardiomegaly. '40mg'$  Lasix IV CMP CBC D-dimer-19.40 EKG-Afib c RVR-scheduled PO cardizem/metoprolol given CBG  Deep oral suctioning-mucus plug suctioned out NTS-large amount thick white/tan secretions  Plan of Care:  Pt taken back to 3[facility] d/t concern regarding pt's breathing. HD not completed. Above interventions were done with no change in pt's assessment. Dr. Sidney Ace to bedside-he is okay with pt receiving HD in this state.    Event Summary:   MD Notified: Dr. Sidney Ace  Call [phone] Arrival Time:2150 End Time:2320  Dillard Essex, RN

## 2020-04-22 NOTE — Progress Notes (Signed)
o2 level 93% family present at bedside

## 2020-04-22 NOTE — Progress Notes (Signed)
Occupational Therapy Treatment Patient Details Name: Stephanie Griffith MRN: [id number] DOB: [birth year] Today's Date: 04/22/2020    History of present illness Pt is a 75 y.o. female with a medical hx significant for pulmonary HTN, L AKA, PAT, PAF, PAD, HTN, hyperglycemia, COPD, chronic renal insufficiency stage IV, CHF, and anemia who presents with increased confusion and decreased Po instake since her ecent admission for COVID pneumonia and encephalopathy (D/C'd 04/04/20). Suspect underlying dementia with sundowning. Pt admitted for acute encephalopathy due to COVID-19. EEG showed: generalized periodic discharges with triphasic morphology at '2hz'$  which is on the ictal-interictal continuum and can sometimes be seen with toxic-metabolic etiology, moderate to severe diffuse encephalopathy with nonspecific etiology, and no seizures were seen. 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 Palliative consult 04/14/20 with family wanting full code, dialysis initiated and transfer to [facility]   OT comments  Daughter present at bedside upon entry. Education on patient CLOF and need for Total A +2 for self-care tasks. Patient with score of 0/23 on JKF Coma Recovery Scale this date. Education provided on BUE/BLE PROM with written handout. Daughter expressed verbal understanding. Patient has not made any progress toward POC goals after multiple therapy sessions between PT/OT and continues to be stuporous and unarousable requiring Total A +2 grossly for all bed mobility and self-care tasks. Per MD note, patient with multiple medical conditions and multiple rapid responses called since admission noting appropriateness for comfort care and hospice. Family recently rescinded DNR requesting dialysis and LTACH. Patient is not a rehab candidate at this time with OT to sign off. Please place new orders if patient becomes appropriate.    Follow Up Recommendations  LTACH     Equipment Recommendations  None recommended by OT    Recommendations for Other Services      Precautions / Restrictions Precautions Precautions: Fall Precaution Comments: L AKA, prosthesis no longer fits has not worn for a long time.Stephanie Griffith Restrictions Weight Bearing Restrictions: No       Mobility Bed Mobility                  Transfers                 General transfer comment: Not appropriate.    Balance                                           ADL either performed or assessed with clinical judgement   ADL                                               Vision       Perception     Praxis      Cognition Arousal/Alertness: Lethargic Behavior During Therapy: Flat affect Overall Cognitive Status: Impaired/Different from baseline Area of Impairment: JFK Recovery Scale Auditory: None Visual: None Motor: None Oromotor/Verbal: None Communication: None Arousal: Unarousable Total Score: 0                 General Comments: Does not attend to environment. Eyes closed throughout tx. session. Unarousable.        Exercises     Shoulder Instructions       General Comments Daughter present  at bedside. OT explained purpose of therapy visit with intention to sign off. Education on PROM HEP with written handout. Daughter expressed verbal understanding.    Pertinent Vitals/ Pain       Pain Assessment: Faces Faces Pain Scale: No hurt  Home Living                                          Prior Functioning/Environment              Frequency           Progress Toward Goals  OT Goals(current goals can now be found in the care plan section)  Progress towards OT goals: Not progressing toward goals - comment (Patient unarousable. Attempted trial for OT. Patient not a candidate for rehab at this time.)  Acute Rehab OT Goals Patient Stated Goal: Unable to state OT Goal  Formulation: Patient unable to participate in goal setting  Plan Other (comment);Discharge plan needs to be updated (Patient is not a rehab candidate at this time. OT to sign off.)    Co-evaluation                 AM-PAC OT "6 Clicks" Daily Activity     Outcome Measure   Help from another person eating meals?: Total Help from another person taking care of personal grooming?: Total Help from another person toileting, which includes using toliet, bedpan, or urinal?: Total Help from another person bathing (including washing, rinsing, drying)?: Total Help from another person to put on and taking off regular upper body clothing?: Total Help from another person to put on and taking off regular lower body clothing?: Total 6 Click Score: 6    End of Session    OT Visit Diagnosis: Cognitive communication deficit (R41.841)   Activity Tolerance Other (comment) (Patient unarousable)   Patient Left in bed;with call bell/phone within reach;with bed alarm set;with restraints reapplied   Nurse Communication Other (comment) (Patient with bowel incontinence.)        Time: [id number] OT Time Calculation (min): 12 min  Charges: OT General Charges $OT Visit: 1 Visit OT Treatments $Therapeutic Activity: 8-22 mins  Anniston Nellums H. OTR/L Supplemental OT, Department of rehab services [phone]   Geanna Divirgilio R H. 04/22/2020, 1:18 PM

## 2020-04-22 NOTE — Progress Notes (Signed)
  [facility] Progress Note     Assessment/ Plan:   1. CKD5, ESRD  1. After discussions with palliative tentative plan is limited trial of HD 2. HD#1 1/19, plan remaining Tx on MWF basis, second HD today 3h 1L UF, 2K, 350/600, no heparin 3. Has IJ Temp cath IR placed 1/16 4. Palliative following 5. If mental status does not improve, ongoing dialysis is not reasonable 2. GOC: see palliative and primary notes.  Currently full scope 3. Aspiration PNA per primary 4. Recent COVID19 infection off isolation 5. HTN/Vol: follow with HD 6. Dementia, persistent encepahlopathy, unlikely to improve with HD 7. AFib 8. Anemia, on ESA and iron, Trend HD. Transfuse per primary. On Fe 9. Leukocytosis   Subjective:   Seen and examined. Remains confused and not following commands, plan for second HD today.   Objective:   BP 127/67 (BP Location: Left Arm)   Pulse 79   Temp 98.7 F (37.1 C) (Oral)   Resp (!) 23   Ht '5\' 8"'$  (1.727 m)   Wt 89 kg   SpO2 93%   BMI 29.83 kg/m   Intake/Output Summary (Last 24 hours) at 04/22/2020 [id number] Last data filed at 04/22/2020 0548 Gross per 24 hour  Intake 11251 ml  Output 1150 ml  Net 10101 ml   Weight change: -9.3 kg  Physical Exam: Gen: chronically ill appearing, not opening eyes and not following commands CVS:reg rate Resp:Clear b/l, no increase work of breathing [id number], soft Ext:+dependent edema Neuro: nonverbal, unresponsive  Imaging: No results found.  Labs: BMET Recent Labs  Lab 04/16/20 0439 04/17/20 0618 04/18/20 0430 04/19/20 0633 04/20/20 0506 04/21/20 0515 04/22/20 0547  NA 139 139 137 138 138 136 136  K 3.8 4.3 4.8 4.4 4.2 4.2 4.2  CL 103 104 103 104 105 102 102  CO2 24 23 21* 21* 21* 21* 20*  GLUCOSE 122* 117* 135* 144* 177* 124* 154*  BUN 71* 76* 84* 94* 100* 84* 92*  CREATININE 5.85* 5.73* 5.78* 6.11* 6.06* 5.20* 5.44*  CALCIUM 7.5* 7.5* 7.8* 8.1* 8.1* 7.8* 7.8*   CBC Recent Labs  Lab  04/19/20 0633 04/20/20 0506 04/21/20 0515 04/22/20 0547  WBC 18.3* 21.1* 18.0* 14.6*  NEUTROABS 15.4* 18.3* 14.6* 11.3*  HGB 7.2* 7.2* 6.5* 8.0*  HCT 24.4* 23.9* 20.8* 24.9*  MCV 99.2 97.2 95.9 93.6  PLT 227 237 186 249    Medications:    . Chlorhexidine Gluconate Cloth  6 each Topical Daily  . Chlorhexidine Gluconate Cloth  6 each Topical Q0600  . darbepoetin (ARANESP) injection - NON-DIALYSIS  200 mcg Subcutaneous Q Thu-1800  . diltiazem  60 mg Per Tube Q8H  . feeding supplement (PROSource TF)  45 mL Per Tube Daily  . free water  150 mL Per Tube Q6H  . heparin  5,000 Units Subcutaneous Q8H  . lactulose  20 g Per Tube BID  . metoprolol tartrate  100 mg Per Tube BID  . sodium chloride flush  10-40 mL Intracatheter Q12H    Leaner Morici Tanna Furry, MD  04/22/2020, 8:24 AM

## 2020-04-22 NOTE — Progress Notes (Signed)
PROGRESS NOTE    Stephanie Griffith  [id number] DOB: [birth year] DOA: 04/04/2020 PCP: [name], MD    Brief Narrative:  75 year old female with history of chronic diastolic heart failure, chronic kidney disease stage V, history of COPD, hypertension, hyperlipidemia, paroxysmal A. fib and pulmonary hypertension with vascular dementia who was recently hospitalized for UTI, COVID-19 pneumonia and had further deterioration of her mental status.  She was discharged home on January 3.  Came back to emergency room on January 6 with worsening confusion, not eating.  Admitted with metabolic encephalopathy.  Treated for aspiration pneumonia.  Remains persistently encephalopathic. Reportedly patient had declined to have hemodialysis at outpatient follow-ups as well she is a poor candidate for hemodialysis. 1/13, daughters rescinded DNR, decided to pursue with all treatments.  Tube feeding started. 1/16, temporary HD catheter placed to plan for trial of dialysis. 1/16, patient remains obtunded. tachycardic at times.  Unable to wake up.   Multiple rapid responses.  CT scans with old  Microvascular changes.  Patient with no meaningful recovery, multiple medical issues and not a rehab candidate at this situation. She is appropriate for comfort care and hospice , however her daughters made decision to give her a trial of dialysis.  Received first dialysis session on 1/19.   Next dialysis planned 1/21.  Assessment & Plan:   Principal Problem:   Acute metabolic encephalopathy Active Problems:   Normocytic anemia   Leukocytosis   PAD (peripheral artery disease) (HCC)   S/P AKA (above knee amputation) (HCC)   Chronic renal insufficiency, stage IV (severe) (HCC)   Metabolic acidosis   Chronic diastolic CHF (congestive heart failure) (HCC)   Encephalopathy due to COVID-19 virus   Dyslipidemia   Acute encephalopathy   Atrial fibrillation with RVR ([place])   Advanced care planning/counseling discussion    Goals of care, counseling/discussion  1. Acute metabolic encephalopathy, profound encephalopathy with underlying vascular dementia.  Probably exacerbated by recent UTI and COVID-19 infection. Suspect Uremia. Currently on symptomatic treatment.  Remains persistently encephalopathic. Dobbhoff tube feeding, tolerating well. Oxygenation is stable. Palliative care consulted, family decided to go aggressive treatment. EEG showed triphasic morphology, started on Keppra that was stopped because of severe depressed mentation. Patient not a permanent dialysis candidate, however family want to see if she responds to temporary dialysis. If patient responds to temporary dialysis, will pursue rehab. If patient does not respond to temporary dialysis, goal of care discussion will be revisited. Received right IJ catheter 1/16. Ammonia elevated , trying lactulose with some improvement of ammonia level but no improvement of mentation. Resume lactulose  Dialysis on 1/19.  Next dialysis planned today.  2.  Suspected aspiration pneumonia: Completed 5 days of antibiotics. Keeps aspirating.   Worsening leukocytosis.  Started on Unasyn.  We will continue.  She is unable to participate in any chest physiotherapy.  3.  Rapid A. fib: Rate variable.  Now rate controlled on metoprolol and Cardizem that we will continue.  4.  Hypokalemia/hypomagnesemia: Replaced and adequate.  5.  CKD stage V: As above.  Creatinine continues to worsen.  Mental status continues to worsen. As above, received first dialysis 1/19.  Followed by nephrology.  6. anemia of chronic disease: Chronic anemia with worsening due to acute critical illness. 1 unit PRBC on 1/20.   Hemoglobin appropriately responded.  We will continue to monitor.   DVT prophylaxis: heparin injection 5,000 Units Start: 04/08/20 0730   Code Status: Full code Family Communication: We will call. Disposition Plan: Status is:  Inpatient  Remains inpatient  appropriate because:Inpatient level of care appropriate due to severity of illness   Dispo: The patient is from: Home              Anticipated d/c is to: Unknown whether she will recover.              Anticipated d/c date is: > 3 days              Patient currently is not medically stable to d/c.         Consultants:   Nephrology  Palliative medicine  Procedures:   None  Antimicrobials:   Unasyn, 1/19---   Subjective: Seen and examined.  Obtunded.  Today she has more secretions and noisy breathing.  Afebrile.  No family at bedside.  Objective: Vitals:   04/21/20 2340 04/22/20 0334 04/22/20 0743 04/22/20 0832  BP: 111/71 136/75 127/67 127/67  Pulse: 81 92 79 95  Resp: 18 20 (!) 23 20  Temp: 97.9 F (36.6 C) 98 F (36.7 C) 98.7 F (37.1 C) 98 F (36.7 C)  TempSrc: Axillary Axillary Oral Oral  SpO2: 95% 95% 93% 94%  Weight:  89 kg    Height:        Intake/Output Summary (Last 24 hours) at 04/22/2020 1126 Last data filed at 04/22/2020 0900 Gross per 24 hour  Intake 11251 ml  Output 1950 ml  Net 9301 ml   Filed Weights   04/20/20 0730 04/20/20 0935 04/22/20 0334  Weight: 98.3 kg 97.3 kg 89 kg    Examination:  Physical Exam Constitutional:      Comments: Unresponsive and obtunded, sick looking. Patient groans on deep stimuli and deep palpation of the belly.  Eyes closed.  No response.  HENT:     Head: Normocephalic.  Cardiovascular:     Rate and Rhythm: Normal rate and regular rhythm.     Heart sounds: Normal heart sounds.  Pulmonary:     Comments: Coarse breath sounds.  Conducted air noise.  On 3 to 4 L of oxygen. Musculoskeletal:     Cervical back: Neck supple.  Skin:    Comments: Left leg amputation stump clean and dry. Pigmentation and chronic lymphedema right leg. 1+ edema, anasarca both hands.    foley with minimal dark urine. Fecal tube collecting large amount of loose stool.  Data Reviewed: I have personally reviewed following labs  and imaging studies  CBC: Recent Labs  Lab 04/18/20 0430 04/19/20 0633 04/20/20 0506 04/21/20 0515 04/22/20 0547  WBC 12.3* 18.3* 21.1* 18.0* 14.6*  NEUTROABS 8.8* 15.4* 18.3* 14.6* 11.3*  HGB 7.5* 7.2* 7.2* 6.5* 8.0*  HCT 24.6* 24.4* 23.9* 20.8* 24.9*  MCV 95.7 99.2 97.2 95.9 93.6  PLT 214 227 237 186 0000000   Basic Metabolic Panel: Recent Labs  Lab 04/16/20 0439 04/17/20 0618 04/18/20 0430 04/19/20 0633 04/20/20 0506 04/21/20 0515 04/22/20 0547  NA 139   < > 137 138 138 136 136  K 3.8   < > 4.8 4.4 4.2 4.2 4.2  CL 103   < > 103 104 105 102 102  CO2 24   < > 21* 21* 21* 21* 20*  GLUCOSE 122*   < > 135* 144* 177* 124* 154*  BUN 71*   < > 84* 94* 100* 84* 92*  CREATININE 5.85*   < > 5.78* 6.11* 6.06* 5.20* 5.44*  CALCIUM 7.5*   < > 7.8* 8.1* 8.1* 7.8* 7.8*  MG 2.3  --   --   --   --   --   --    < > =  values in this interval not displayed.   GFR: Estimated Creatinine Clearance: 10.6 mL/min (A) (by C-G formula based on SCr of 5.44 mg/dL (H)). Liver Function Tests: Recent Labs  Lab 04/18/20 0430  AST 33  ALT 19  ALKPHOS 108  BILITOT 0.7  PROT 5.6*  ALBUMIN 1.7*   No results for input(s): LIPASE, AMYLASE in the last 168 hours. Recent Labs  Lab 04/18/20 0520 04/19/20 0633 04/20/20 0506 04/22/20 0547  AMMONIA 118* 103* 67* 78*   Coagulation Profile: No results for input(s): INR, PROTIME in the last 168 hours. Cardiac Enzymes: No results for input(s): CKTOTAL, CKMB, CKMBINDEX, TROPONINI in the last 168 hours. BNP (last 3 results) No results for input(s): PROBNP in the last 8760 hours. HbA1C: No results for input(s): HGBA1C in the last 72 hours. CBG: Recent Labs  Lab 04/21/20 1538 04/21/20 2018 04/21/20 2339 04/22/20 0333 04/22/20 0753  GLUCAP 142* 138* 158* 150* 150*   Lipid Profile: No results for input(s): CHOL, HDL, LDLCALC, TRIG, CHOLHDL, LDLDIRECT in the last 72 hours. Thyroid Function Tests: No results for input(s): TSH, T4TOTAL, FREET4,  T3FREE, THYROIDAB in the last 72 hours. Anemia Panel: No results for input(s): VITAMINB12, FOLATE, FERRITIN, TIBC, IRON, RETICCTPCT in the last 72 hours. Sepsis Labs: No results for input(s): PROCALCITON, LATICACIDVEN in the last 168 hours.  No results found for this or any previous visit (from the past 240 hour(s)).       Radiology Studies: No results found.      Scheduled Meds:  Chlorhexidine Gluconate Cloth  6 each Topical Daily   Chlorhexidine Gluconate Cloth  6 each Topical Q0600   darbepoetin (ARANESP) injection - NON-DIALYSIS  200 mcg Subcutaneous Q Thu-1800   diltiazem  60 mg Per Tube Q8H   feeding supplement (PROSource TF)  45 mL Per Tube Daily   free water  150 mL Per Tube Q6H   heparin  5,000 Units Subcutaneous Q8H   lactulose  20 g Per Tube BID   metoprolol tartrate  100 mg Per Tube BID   sodium chloride flush  10-40 mL Intracatheter Q12H   Continuous Infusions:  ampicillin-sulbactam (UNASYN) IV 1.5 g (04/22/20 0214)   feeding supplement (OSMOLITE 1.2 CAL) 1,000 mL (04/22/20 1103)   ferric gluconate (FERRLECIT/NULECIT) IV Stopped (04/15/20 1922)     LOS: 14 days    Time spent: 30 minutes    Barb Merino, MD [facility] Pager [phone]

## 2020-04-22 NOTE — Progress Notes (Signed)
o2 94% 2l 02, R 22, family at bedside, patient bathed, bedlinen changed, rectal tube came out of rectum and was re-inserted into rectum,verified tube in rectum with NT Essence. Foley care perfromed

## 2020-04-22 NOTE — Progress Notes (Signed)
SLP Cancellation Note  Patient Details Name: Stephanie Griffith MRN: [id number] DOB: [birth year]   Cancelled treatment:       Reason Eval/Treat Not Completed: Patient's level of consciousness. Pt not alert throughout the week for SLP interventions. Will sign off at this time.   Herbie Baltimore, MA CCC-SLP  Acute Rehabilitation Services Pager [phone] Office [phone]  Lynann Beaver 04/22/2020, 7:09 AM

## 2020-04-22 NOTE — Plan of Care (Signed)

## 2020-04-23 DIAGNOSIS — I469 Cardiac arrest, cause unspecified: Secondary | ICD-10-CM

## 2020-04-23 DIAGNOSIS — R0603 Acute respiratory distress: Secondary | ICD-10-CM

## 2020-04-23 DIAGNOSIS — G9340 Encephalopathy, unspecified: Secondary | ICD-10-CM | POA: Diagnosis not present

## 2020-04-23 DIAGNOSIS — G9341 Metabolic encephalopathy: Secondary | ICD-10-CM | POA: Diagnosis not present

## 2020-04-23 DIAGNOSIS — Z7189 Other specified counseling: Secondary | ICD-10-CM | POA: Diagnosis not present

## 2020-04-23 LAB — POCT I-STAT 7, (LYTES, BLD GAS, ICA,H+H)
Acid-base deficit: 10 mmol/L — ABNORMAL HIGH (ref 0.0–2.0)
Bicarbonate: 18.3 mmol/L — ABNORMAL LOW (ref 20.0–28.0)
Calcium, Ion: 1.15 mmol/L (ref 1.15–1.40)
HCT: 25 % — ABNORMAL LOW (ref 36.0–46.0)
Hemoglobin: 8.5 g/dL — ABNORMAL LOW (ref 12.0–15.0)
O2 Saturation: 100 %
Patient temperature: 101
Potassium: 4.9 mmol/L (ref 3.5–5.1)
Sodium: 137 mmol/L (ref 135–145)
TCO2: 20 mmol/L — ABNORMAL LOW (ref 22–32)
pCO2 arterial: 57.3 mmHg — ABNORMAL HIGH (ref 32.0–48.0)
pH, Arterial: 7.12 — CL (ref 7.350–7.450)
pO2, Arterial: 230 mmHg — ABNORMAL HIGH (ref 83.0–108.0)

## 2020-04-23 LAB — CBC WITH DIFFERENTIAL/PLATELET
Abs Immature Granulocytes: 1.02 10*3/uL — ABNORMAL HIGH (ref 0.00–0.07)
Basophils Absolute: 0.1 10*3/uL (ref 0.0–0.1)
Basophils Relative: 0 %
Eosinophils Absolute: 0.1 10*3/uL (ref 0.0–0.5)
Eosinophils Relative: 1 %
HCT: 27.6 % — ABNORMAL LOW (ref 36.0–46.0)
Hemoglobin: 8.2 g/dL — ABNORMAL LOW (ref 12.0–15.0)
Immature Granulocytes: 7 %
Lymphocytes Relative: 11 %
Lymphs Abs: 1.6 10*3/uL (ref 0.7–4.0)
MCH: 30 pg (ref 26.0–34.0)
MCHC: 29.7 g/dL — ABNORMAL LOW (ref 30.0–36.0)
MCV: 101.1 fL — ABNORMAL HIGH (ref 80.0–100.0)
Monocytes Absolute: 1.1 10*3/uL — ABNORMAL HIGH (ref 0.1–1.0)
Monocytes Relative: 8 %
Neutro Abs: 10.5 10*3/uL — ABNORMAL HIGH (ref 1.7–7.7)
Neutrophils Relative %: 73 %
Platelets: 267 10*3/uL (ref 150–400)
RBC: 2.73 MIL/uL — ABNORMAL LOW (ref 3.87–5.11)
RDW: 19.9 % — ABNORMAL HIGH (ref 11.5–15.5)
WBC: 14.3 10*3/uL — ABNORMAL HIGH (ref 4.0–10.5)
nRBC: 1.5 % — ABNORMAL HIGH (ref 0.0–0.2)

## 2020-04-23 LAB — COMPREHENSIVE METABOLIC PANEL
ALT: 19 U/L (ref 0–44)
ALT: 26 U/L (ref 0–44)
AST: 23 U/L (ref 15–41)
AST: 39 U/L (ref 15–41)
Albumin: 1.6 g/dL — ABNORMAL LOW (ref 3.5–5.0)
Albumin: 1.5 g/dL — ABNORMAL LOW (ref 3.5–5.0)
Alkaline Phosphatase: 95 U/L (ref 38–126)
Alkaline Phosphatase: 101 U/L (ref 38–126)
Anion gap: 13 (ref 5–15)
Anion gap: 18 — ABNORMAL HIGH (ref 5–15)
BUN: 101 mg/dL — ABNORMAL HIGH (ref 8–23)
BUN: 101 mg/dL — ABNORMAL HIGH (ref 8–23)
CO2: 20 mmol/L — ABNORMAL LOW (ref 22–32)
CO2: 18 mmol/L — ABNORMAL LOW (ref 22–32)
Calcium: 7.9 mg/dL — ABNORMAL LOW (ref 8.9–10.3)
Calcium: 8.2 mg/dL — ABNORMAL LOW (ref 8.9–10.3)
Chloride: 103 mmol/L (ref 98–111)
Chloride: 103 mmol/L (ref 98–111)
Creatinine, Ser: 5.48 mg/dL — ABNORMAL HIGH (ref 0.44–1.00)
Creatinine, Ser: 6.12 mg/dL — ABNORMAL HIGH (ref 0.44–1.00)
GFR, Estimated: 8 mL/min — ABNORMAL LOW (ref >60)
GFR, Estimated: 7 mL/min — ABNORMAL LOW (ref >60)
Glucose, Bld: 142 mg/dL — ABNORMAL HIGH (ref 70–99)
Glucose, Bld: 205 mg/dL — ABNORMAL HIGH (ref 70–99)
Potassium: 4.4 mmol/L (ref 3.5–5.1)
Potassium: 5.3 mmol/L — ABNORMAL HIGH (ref 3.5–5.1)
Sodium: 136 mmol/L (ref 135–145)
Sodium: 139 mmol/L (ref 135–145)
Total Bilirubin: 0.7 mg/dL (ref 0.3–1.2)
Total Bilirubin: 0.6 mg/dL (ref 0.3–1.2)
Total Protein: 6 g/dL — ABNORMAL LOW (ref 6.5–8.1)
Total Protein: 6.1 g/dL — ABNORMAL LOW (ref 6.5–8.1)

## 2020-04-23 LAB — GLUCOSE, CAPILLARY
Glucose-Capillary: 105 mg/dL — ABNORMAL HIGH (ref 70–99)
Glucose-Capillary: 131 mg/dL — ABNORMAL HIGH (ref 70–99)
Glucose-Capillary: 137 mg/dL — ABNORMAL HIGH (ref 70–99)
Glucose-Capillary: 16 mg/dL — CL (ref 70–99)
Glucose-Capillary: 168 mg/dL — ABNORMAL HIGH (ref 70–99)
Glucose-Capillary: 46 mg/dL — ABNORMAL LOW (ref 70–99)
Glucose-Capillary: 70 mg/dL (ref 70–99)
Glucose-Capillary: 95 mg/dL (ref 70–99)
Glucose-Capillary: <10 mg/dL — CL (ref 70–99)

## 2020-04-23 LAB — BASIC METABOLIC PANEL
BUN: 99 mg/dL — ABNORMAL HIGH (ref 8–23)
CO2: <7 mmol/L — ABNORMAL LOW (ref 22–32)
Calcium: 7.6 mg/dL — ABNORMAL LOW (ref 8.9–10.3)
Chloride: 102 mmol/L (ref 98–111)
Creatinine, Ser: 6.11 mg/dL — ABNORMAL HIGH (ref 0.44–1.00)
GFR, Estimated: 7 mL/min — ABNORMAL LOW (ref >60)
Glucose, Bld: 130 mg/dL — ABNORMAL HIGH (ref 70–99)
Potassium: >7.5 mmol/L (ref 3.5–5.1)
Sodium: 131 mmol/L — ABNORMAL LOW (ref 135–145)

## 2020-04-23 LAB — PROTIME-INR
INR: 1.2 (ref 0.8–1.2)
Prothrombin Time: 14.9 seconds (ref 11.4–15.2)

## 2020-04-23 LAB — APTT: aPTT: 41 seconds — ABNORMAL HIGH (ref 24–36)

## 2020-04-23 LAB — AMMONIA: Ammonia: 138 umol/L — ABNORMAL HIGH (ref 9–35)

## 2020-04-23 MED ORDER — AMIODARONE IV BOLUS ONLY 150 MG/100ML
150.0000 mg | Freq: Once | INTRAVENOUS | Status: AC
  Administered 2020-04-23: 150 mg via INTRAVENOUS
  Filled 2020-04-23: qty 100

## 2020-04-23 MED ORDER — SODIUM ZIRCONIUM CYCLOSILICATE 10 G PO PACK
10.0000 g | PACK | Freq: Once | ORAL | Status: DC
  Filled 2020-04-23: qty 1

## 2020-04-23 MED ORDER — PHENYLEPHRINE HCL-NACL 10-0.9 MG/250ML-% IV SOLN
0.0000 ug/min | INTRAVENOUS | Status: DC
  Administered 2020-04-23: 150 ug/min via INTRAVENOUS
  Administered 2020-04-23: 400 ug/min via INTRAVENOUS
  Administered 2020-04-23: 20 ug/min via INTRAVENOUS
  Administered 2020-04-23 (×2): 400 ug/min via INTRAVENOUS
  Filled 2020-04-23 (×6): qty 250

## 2020-04-23 MED ORDER — METOPROLOL TARTRATE 25 MG/10 ML ORAL SUSPENSION
50.0000 mg | Freq: Two times a day (BID) | ORAL | Status: DC
  Administered 2020-04-23: 50 mg
  Filled 2020-04-23: qty 20

## 2020-04-23 MED ORDER — SODIUM CHLORIDE 0.9 % IV SOLN: INTRAVENOUS | Status: DC

## 2020-04-23 MED ORDER — CHLORHEXIDINE GLUCONATE 0.12% ORAL RINSE (MEDLINE KIT)
15.0000 mL | Freq: Two times a day (BID) | OROMUCOSAL | Status: DC
  Administered 2020-04-23 (×2): 15 mL via OROMUCOSAL

## 2020-04-23 MED ORDER — PHENYLEPHRINE HCL-NACL 10-0.9 MG/250ML-% IV SOLN
INTRAVENOUS | Status: AC
  Administered 2020-04-23: 400 ug/min
  Filled 2020-04-23: qty 250

## 2020-04-23 MED ORDER — EPINEPHRINE HCL 5 MG/250ML IV SOLN IN NS: 0.5000 ug/min | INTRAVENOUS | Status: DC

## 2020-04-23 MED ORDER — IPRATROPIUM-ALBUTEROL 0.5-2.5 (3) MG/3ML IN SOLN
RESPIRATORY_TRACT | Status: AC
  Filled 2020-04-23: qty 3

## 2020-04-23 MED ORDER — ORAL CARE MOUTH RINSE
15.0000 mL | OROMUCOSAL | Status: DC
  Administered 2020-04-23 (×5): 15 mL via OROMUCOSAL

## 2020-04-23 MED ORDER — NOREPINEPHRINE 4 MG/250ML-% IV SOLN
0.0000 ug/min | INTRAVENOUS | Status: DC
  Administered 2020-04-23: 24 ug/min via INTRAVENOUS
  Administered 2020-04-23: 12 ug/min via INTRAVENOUS
  Administered 2020-04-23: 20 ug/min via INTRAVENOUS
  Administered 2020-04-23 (×2): 22 ug/min via INTRAVENOUS
  Administered 2020-04-23: 2 ug/min via INTRAVENOUS
  Filled 2020-04-23 (×2): qty 250
  Filled 2020-04-23: qty 500
  Filled 2020-04-23 (×5): qty 250

## 2020-04-23 MED ORDER — DEXTROSE 50 % IV SOLN
INTRAVENOUS | Status: AC
  Administered 2020-04-23: 50 mL
  Filled 2020-04-23: qty 50

## 2020-04-23 MED ORDER — ALBUMIN HUMAN 25 % IV SOLN
25.0000 g | Freq: Once | INTRAVENOUS | Status: AC
  Administered 2020-04-23: 25 g via INTRAVENOUS
  Filled 2020-04-23: qty 100

## 2020-04-23 MED ORDER — VASOPRESSIN 20 UNITS/100 ML INFUSION FOR SHOCK
0.0000 [IU]/min | INTRAVENOUS | Status: DC
  Administered 2020-04-23 (×2): 0.03 [IU]/min via INTRAVENOUS
  Filled 2020-04-23: qty 200
  Filled 2020-04-23 (×2): qty 100

## 2020-04-23 MED ORDER — DEXTROSE 50 % IV SOLN: 12.5000 g | INTRAVENOUS | Status: AC

## 2020-04-23 MED ORDER — NOREPINEPHRINE 4 MG/250ML-% IV SOLN
INTRAVENOUS | Status: AC
  Filled 2020-04-23: qty 250

## 2020-04-23 MED ORDER — FENTANYL CITRATE (PF) 100 MCG/2ML IJ SOLN: 25.0000 ug | INTRAMUSCULAR | Status: DC | PRN

## 2020-04-23 MED ORDER — CALCIUM GLUCONATE-NACL 1-0.675 GM/50ML-% IV SOLN: 1.0000 g | Freq: Once | INTRAVENOUS | Status: DC

## 2020-04-23 MED ORDER — DEXTROSE 50 % IV SOLN: 1.0000 | Freq: Once | INTRAVENOUS | Status: DC

## 2020-04-23 MED ORDER — PHENYLEPHRINE CONCENTRATED 100MG/250ML (0.4 MG/ML) INFUSION SIMPLE
0.0000 ug/min | INTRAVENOUS | Status: DC
  Administered 2020-04-23: 355 ug/min via INTRAVENOUS
  Administered 2020-04-23 (×2): 400 ug/min via INTRAVENOUS
  Filled 2020-04-23 (×6): qty 250

## 2020-04-23 MED ORDER — INSULIN ASPART 100 UNIT/ML IV SOLN: 10.0000 [IU] | Freq: Once | INTRAVENOUS | Status: DC

## 2020-04-23 MED ORDER — PANTOPRAZOLE SODIUM 40 MG IV SOLR
40.0000 mg | Freq: Every day | INTRAVENOUS | Status: DC
  Administered 2020-04-23: 40 mg via INTRAVENOUS
  Filled 2020-04-23: qty 40

## 2020-04-23 MED ORDER — SODIUM BICARBONATE 8.4 % IV SOLN: 100.0000 meq | Freq: Once | INTRAVENOUS | Status: DC

## 2020-04-23 MED ORDER — IPRATROPIUM-ALBUTEROL 0.5-2.5 (3) MG/3ML IN SOLN
3.0000 mL | Freq: Four times a day (QID) | RESPIRATORY_TRACT | Status: DC
  Administered 2020-04-23 (×2): 3 mL via RESPIRATORY_TRACT
  Filled 2020-04-23 (×2): qty 3

## 2020-04-23 MED ORDER — DEXTROSE 50 % IV SOLN
50.0000 mL | Freq: Once | INTRAVENOUS | Status: AC
  Filled 2020-04-23: qty 50

## 2020-04-23 MED ORDER — DEXTROSE 10 % IV SOLN: INTRAVENOUS | Status: DC

## 2020-04-23 MED ORDER — DILTIAZEM 12 MG/ML ORAL SUSPENSION
30.0000 mg | Freq: Three times a day (TID) | ORAL | Status: DC
  Filled 2020-04-23 (×2): qty 3

## 2020-04-24 DIAGNOSIS — G9341 Metabolic encephalopathy: Secondary | ICD-10-CM | POA: Diagnosis not present

## 2020-04-24 DIAGNOSIS — G9340 Encephalopathy, unspecified: Secondary | ICD-10-CM | POA: Diagnosis not present

## 2020-04-24 DIAGNOSIS — Z7189 Other specified counseling: Secondary | ICD-10-CM | POA: Diagnosis not present

## 2020-04-25 MED FILL — Phenylephrine HCl IV Soln 10 MG/ML: INTRAVENOUS | Qty: 10 | Status: AC

## 2020-04-25 MED FILL — Sodium Chloride IV Soln 0.9%: INTRAVENOUS | Qty: 250 | Status: AC

## 2020-04-25 MED FILL — Medication: Qty: 1 | Status: AC

## 2020-04-26 LAB — GLUCOSE, CAPILLARY
Glucose-Capillary: 39 mg/dL — CL (ref 70–99)
Glucose-Capillary: 16 mg/dL — CL (ref 70–99)

## 2020-05-03 NOTE — Progress Notes (Signed)
eLink Physician-Brief Progress Note Patient Name: Stephanie Griffith DOB: 12-25-1945 MRN: [id number]   Date of Service  05-05-20  HPI/Events of Note  Patient with persistent hypotension.  eICU Interventions  Vasopressin added at 0.03 mcg.        Kerry Kass Ogan 05/05/20, 4:05 AM

## 2020-05-03 NOTE — Progress Notes (Signed)
[facility] Progress Note Patient Name: ALFRIEDA LUI DOB: [birth year] MRN: [id number]   Date of Service  05/21/2020  HPI/Events of Note  Hyperkalemia - K+ > 7.5.   eICU Interventions  Plan: 1. NaHCO3 100 meq IV now. 2. Calcium gluconate 1 gm IV now. 3. D50 1 amp IV now.  4. Novolog insulin 10 units IV now.  5. Lokelma 10 gm per tube now. 6. Repeat BMP at 5 AM.  7. Bedside nurse to notify Nephrology service to see if they have any other suggestions.      Intervention Category Major Interventions: Electrolyte abnormality - evaluation and management  Izreal Kock [place] 21-May-2020, 11:03 PM

## 2020-05-03 NOTE — Progress Notes (Signed)
[facility] Progress Note     Assessment/ Plan:   1. CKD5, ESRD  1. After discussions with palliative tentative plan is limited trial of HD 2. HD#1 1/19, Has IJ Temp cath IR placed 1/16 3. She had cardiac arrest and transferred to ICU overnight.  Unable to receive dialysis yesterday.  Clinical condition worsened therefore I recommend hospice and comfort care.  She was intubated in ICU however now the [place] changed to DNR.  As per nurse there is a plan for family meeting today.  2. GOC: see palliative and primary notes.  Now CODE STATUS changed to DNR. 3. VDRF/Aspiration PNA per primary 4. Cardiac arrest on 1/22 and transferred to ICU. 5. Recent COVID19 infection off isolation 6. Shock/Vol: follow with HD.  On vasopressin phenylephrine and norepinephrine. 7. Dementia, persistent encepahlopathy, unlikely to improve with HD 8. AFib: Tachycardic on amiodarone. 9. Anemia, on ESA and iron, Trend HD. Transfuse per primary. On Fe 10. Leukocytosis   Subjective:   .  Overnight event noted.  Patient had cardiac arrest and transferred to ICU, intubated and started on pressors.  The CODE STATUS now changed to DNR.  Did not receive dialysis.  Objective:   BP (!) 80/63 (BP Location: Left Arm)   Pulse (!) 115   Temp 98.1 F (36.7 C)   Resp 18   Ht '5\' 8"'$  (1.727 m)   Wt 89 kg   SpO2 95%   BMI 29.83 kg/m   Intake/Output Summary (Last 24 hours) at 05-22-2020 [id number] Last data filed at May 22, 2020 0600 Gross per 24 hour  Intake 1522.94 ml  Output 1200 ml  Net 322.94 ml   Weight change:   Physical Exam: Gen: Intubated, not responding CVS:reg rate Resp: Coarse breath sound bilateral [id number], soft Ext:+dependent edema Neuro: nonverbal, unresponsive  Imaging: DG Chest Port 1 View  Result Date: 04/22/2020 CLINICAL DATA:  Acute respiratory distress EXAM: PORTABLE CHEST 1 VIEW COMPARISON:  04/17/2020 FINDINGS: Nasoenteric feeding tube extends into the upper abdomen  beyond the margin of the examination. Right internal jugular hemodialysis catheter is seen with its tip within the superior vena cava. Lung volumes are small, however, are symmetric and are stable when compared to prior examination. No pneumothorax. Tiny left pleural effusion is unchanged. Moderate cardiomegaly is stable. Superimposed mild interstitial pulmonary edema persists, slightly improved when compared to prior examination. IMPRESSION: Improving pulmonary edema, likely cardiogenic in nature. Stable moderate cardiomegaly. Stable support lines and tubes. Electronically Signed   By: Fidela Salisbury MD   On: 04/22/2020 23:21    Labs: BMET Recent Labs  Lab 04/18/20 0430 04/19/20 [id number] 04/20/20 [id number] 04/21/20 0515 04/22/20 0547 04/22/20 2212 05-22-2020 0158 May 22, 2020 0208  NA 137 138 138 136 136 136 139 137  K 4.8 4.4 4.2 4.2 4.2 4.4 5.3* 4.9  CL 103 104 105 102 102 103 103  --   CO2 21* 21* 21* 21* 20* 20* 18*  --   GLUCOSE 135* 144* 177* 124* 154* 142* 205*  --   BUN 84* 94* 100* 84* 92* 101* 101*  --   CREATININE 5.78* 6.11* 6.06* 5.20* 5.44* 5.48* 6.12*  --   CALCIUM 7.8* 8.1* 8.1* 7.8* 7.8* 7.9* 8.2*  --    CBC Recent Labs  Lab 04/20/20 0506 04/21/20 0515 04/22/20 0547 04/22/20 2212 May 22, 2020 0158 05/22/20 0208  WBC 21.1* 18.0* 14.6* 13.4* 14.3*  --   NEUTROABS 18.3* 14.6* 11.3*  --  10.5*  --   HGB 7.2* 6.5* 8.0* 8.1*  8.2* 8.5*  HCT 23.9* 20.8* 24.9* 26.6* 27.6* 25.0*  MCV 97.2 95.9 93.6 97.1 101.1*  --   PLT 237 186 249 276 267  --     Medications:    . Chlorhexidine Gluconate Cloth  6 each Topical Q0600  . darbepoetin (ARANESP) injection - NON-DIALYSIS  200 mcg Subcutaneous Q Thu-1800  . diltiazem  30 mg Per Tube Q8H  . free water  150 mL Per Tube Q6H  . heparin  5,000 Units Subcutaneous Q8H  . lactulose  20 g Per Tube BID  . metoprolol tartrate  50 mg Per Tube BID  . pantoprazole (PROTONIX) IV  40 mg Intravenous QHS  . sodium bicarbonate  50 mEq Intravenous Once   . sodium chloride flush  10-40 mL Intracatheter Q12H    Jamaury Gumz Tanna Furry, MD  16-May-2020, 8:06 AM

## 2020-05-03 NOTE — Death Summary Note (Signed)
DEATH SUMMARY   Patient Details  Name: Stephanie Griffith MRN: [id number] DOB: Nov 01, 1945  Admission/Discharge Information   Admit Date:  Apr 16, 2020  Date of Death: Date of Death: 05-02-20  Time of Death: Time of Death: 06/28/10  Length of Stay: 06/25/2022  Referring Physician: [name], MD   Reason(s) for Hospitalization  Patient presented was brought to the hospital for altered mentation, decreased p.o. intake. Recently discharged from the hospital following treatment for COVID pneumonia and encephalopathy.  Diagnoses  Preliminary cause of death:   Acute hypoxemic respiratory failure Secondary Diagnoses (including complications and co-morbidities):  Principal Problem:   Acute metabolic encephalopathy Active Problems:   Normocytic anemia   Leukocytosis   PAD (peripheral artery disease) (HCC)   S/P AKA (above knee amputation) (HCC)   Chronic renal insufficiency, stage IV (severe) (HCC)   Metabolic acidosis   Chronic diastolic CHF (congestive heart failure) (HCC)   Encephalopathy due to COVID-19 virus   Dyslipidemia   Acute encephalopathy   Atrial fibrillation with RVR ([place])   Advanced care planning/counseling discussion   Goals of care, counseling/discussion   Acute respiratory distress   Cardiac arrest [facility]) Pulmonary hypertension Acute kidney injury on chronic kidney disease requiring dialysis  Brief Hospital Course (including significant findings, care, treatment, and services provided and events leading to death)  Stephanie Griffith is a 75 y.o. year old female who was brought to the hospital for evaluation of altered mentation and decreased p.o. intake. Patient had a recent hospitalization for Covid infection with pneumonia, respiratory failure. Was recommended for nursing home placement but went home with family instead. Was doing poorly at home for few days and was subsequently brought into the hospital. Patient does have underlying dementia and multiple comorbidities including  chronic kidney disease stage IV, congestive heart failure-diastolic, paroxysmal atrial fibrillation, COPD, hypertension, peripheral arterial disease Was being treated for possible aspiration pneumonia-completed treatment for pneumonia, worsening kidney function Patient remained persistently encephalopathic. Her DNR status was rescinded in order to pursue further treatment. Had a temporary dialysis catheter placed and dialysis was initiated on 1/19 Remained obtunded throughout the hospitalization Decompensated on 04/23/2019 becoming increasingly hypoxemic and tachypneic Patient went into asystole and had CPR initiated Achieved ROSC after about 10 minutes and was transferred to the ICU. Patient required multiple pressors including epinephrine, Levophed, vasopressin. No purposeful response to stimulation despite being off any sedation. Multiple discussions was had with patient's family members over the phone and also at bedside.  Plan of care was continued until patient finally succumbed to her illness at 06/28/2310 on May 02, 2020 Pertinent Labs and Studies  Significant Diagnostic Studies CT HEAD WO CONTRAST  Result Date: 04/10/2020 CLINICAL DATA:  Initial evaluation for acute delirium. EXAM: CT HEAD WITHOUT CONTRAST TECHNIQUE: Contiguous axial images were obtained from the base of the skull through the vertex without intravenous contrast. COMPARISON:  Prior CT from Apr 16, 2020. FINDINGS: Brain: Stable atrophy with moderate chronic microvascular ischemic disease. Remote right cerebellar infarct. No acute intracranial hemorrhage. No acute large vessel territory infarct. No mass lesion, midline shift or mass effect. No hydrocephalus or extra-axial fluid collection. Vascular: No hyperdense vessel. Calcified atherosclerosis at the skull base. Skull: Scalp soft tissues and calvarium within normal limits. Sinuses/Orbits: Globes and orbital soft tissues demonstrate no acute finding. Chronic left maxillary sinusitis  noted. Paranasal sinuses are otherwise largely clear. No mastoid effusion. Other: None. IMPRESSION: 1. No acute intracranial abnormality. 2. Stable atrophy with moderate chronic microvascular ischemic disease. 3. Remote right cerebellar infarct. 4. Chronic left  maxillary sinusitis. Electronically Signed   By: Jeannine Boga M.D.   On: 04/10/2020 02:33   CT Head Wo Contrast  Result Date: 04/08/2020 CLINICAL DATA:  75 year old female with altered mental status. EXAM: CT HEAD WITHOUT CONTRAST TECHNIQUE: Contiguous axial images were obtained from the base of the skull through the vertex without intravenous contrast. COMPARISON:  Head CT dated 03/30/2020. FINDINGS: Brain: There is mild age-related atrophy and moderate chronic microvascular ischemic changes. 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 There is no acute intracranial hemorrhage. No mass effect or midline shift. No extra-axial fluid collection. Vascular: No hyperdense vessel or unexpected calcification. Skull: Normal. Negative for fracture or focal lesion. Sinuses/Orbits: Diffuse mucoperiosteal thickening of the left maxillary sinus. The remainder of the visualized paranasal sinuses and mastoid air cells are clear. Other: None IMPRESSION: 1. No acute intracranial pathology. 2. Age-related atrophy and chronic microvascular ischemic changes. 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 Electronically Signed   By: Anner Crete M.D.   On: 04/05/2020 17:24   CT Head Wo Contrast  Result Date: 03/30/2020 CLINICAL DATA:  Mental status change. EXAM: CT HEAD WITHOUT CONTRAST TECHNIQUE: Contiguous axial images were obtained from the base of the skull through the vertex without intravenous contrast. COMPARISON:  CT and MRI March 02, 2020. FINDINGS: Brain: No evidence of acute large vascular territory infarction, hemorrhage, hydrocephalus, extra-axial collection or mass lesion/mass effect. Similar patchy white matter hypoattenuation, most  likely related to chronic microvascular ischemic disease. Similar remote infarcts in the cerebellum, largest on the right. Vascular: Calcific atherosclerosis. No hyperdense vessel identified. Skull: No acute fracture. Sinuses/Orbits: Near complete opacification left maxillary sinus with internal hyperdensity and maxillary sinus wall thickening suggesting chronicity. Unremarkable orbits. Other: No mastoid effusions. IMPRESSION: No evidence of acute intracranial abnormality. 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 Left maxillary sinus disease. Internal hyperdensity may represent inspissated secretions and/or fungal colonization. Electronically Signed   By: Margaretha Sheffield MD   On: 03/30/2020 15:11   IR Fluoro Guide CV Line Right  Result Date: 04/17/2020 INDICATION: 75 year old with renal failure. Request for non tunneled dialysis catheter. EXAM: FLUOROSCOPIC AND ULTRASOUND GUIDED PLACEMENT OF A NON-TUNNELED DIALYSIS CATHETER Physician: Stephan Minister. Henn, MD MEDICATIONS: None ANESTHESIA/SEDATION: None FLUOROSCOPY TIME:  Fluoroscopy Time: 48 seconds, 3 mGy COMPLICATIONS: None immediate. PROCEDURE: Informed consent was obtained for catheter placement. The patient was placed supine on the interventional table. Ultrasound confirmed a patent right internal jugular vein. Ultrasound images were obtained for documentation. The right neck was prepped and draped in a sterile fashion. The right neck was anesthetized with 1% lidocaine. Maximal barrier sterile technique was utilized including caps, mask, sterile gowns, sterile gloves, sterile drape, hand hygiene and skin antiseptic. A small incision was made with #11 blade scalpel. Needle was directed into the right internal jugular vein using ultrasound guidance. Wire was preferentially going into the right subclavian vein. Therefore, a 5 [place] Kumpe catheter was advanced over the wire and directed into the SVC. Kumpe catheter  was removed. A 16 cm Mahurkar catheter was selected. The catheter was advanced over a wire and positioned at the superior cavoatrial junction. Fluoroscopic images were obtained for documentation. Both dialysis lumens were found to aspirate and flush well. The proper amount of heparin was flushed in both lumens. The central venous lumen was flushed with normal saline. Catheter was sutured to skin. FINDINGS: Catheter tip at the superior cavoatrial junction. IMPRESSION: Successful placement of a right jugular non-tunneled dialysis catheter using ultrasound and fluoroscopic guidance.  Electronically Signed   By: Markus Daft M.D.   On: 04/17/2020 09:01   IR US Guide Vasc Access Right  Result Date: 04/17/2020 INDICATION: 75 year old with renal failure. Request for non tunneled dialysis catheter. EXAM: FLUOROSCOPIC AND ULTRASOUND GUIDED PLACEMENT OF A NON-TUNNELED DIALYSIS CATHETER Physician: Stephan Minister. Henn, MD MEDICATIONS: None ANESTHESIA/SEDATION: None FLUOROSCOPY TIME:  Fluoroscopy Time: 48 seconds, 3 mGy COMPLICATIONS: None immediate. PROCEDURE: Informed consent was obtained for catheter placement. The patient was placed supine on the interventional table. Ultrasound confirmed a patent right internal jugular vein. Ultrasound images were obtained for documentation. The right neck was prepped and draped in a sterile fashion. The right neck was anesthetized with 1% lidocaine. Maximal barrier sterile technique was utilized including caps, mask, sterile gowns, sterile gloves, sterile drape, hand hygiene and skin antiseptic. A small incision was made with #11 blade scalpel. Needle was directed into the right internal jugular vein using ultrasound guidance. Wire was preferentially going into the right subclavian vein. Therefore, a 5 [place] Kumpe catheter was advanced over the wire and directed into the SVC. Kumpe catheter was removed. A 16 cm Mahurkar catheter was selected. The catheter was advanced over a wire and positioned  at the superior cavoatrial junction. Fluoroscopic images were obtained for documentation. Both dialysis lumens were found to aspirate and flush well. The proper amount of heparin was flushed in both lumens. The central venous lumen was flushed with normal saline. Catheter was sutured to skin. FINDINGS: Catheter tip at the superior cavoatrial junction. IMPRESSION: Successful placement of a right jugular non-tunneled dialysis catheter using ultrasound and fluoroscopic guidance. Electronically Signed   By: Markus Daft M.D.   On: 04/17/2020 09:01   DG Chest Port 1 View  Result Date: 04/22/2020 CLINICAL DATA:  Acute respiratory distress EXAM: PORTABLE CHEST 1 VIEW COMPARISON:  04/17/2020 FINDINGS: Nasoenteric feeding tube extends into the upper abdomen beyond the margin of the examination. Right internal jugular hemodialysis catheter is seen with its tip within the superior vena cava. Lung volumes are small, however, are symmetric and are stable when compared to prior examination. No pneumothorax. Tiny left pleural effusion is unchanged. Moderate cardiomegaly is stable. Superimposed mild interstitial pulmonary edema persists, slightly improved when compared to prior examination. IMPRESSION: Improving pulmonary edema, likely cardiogenic in nature. Stable moderate cardiomegaly. Stable support lines and tubes. Electronically Signed   By: Fidela Salisbury MD   On: 04/22/2020 23:21   DG Chest Port 1 View  Result Date: 04/17/2020 CLINICAL DATA:  History of COVID positive with low oxygen saturation. EXAM: PORTABLE CHEST 1 VIEW COMPARISON:  April 10, 2020 FINDINGS: The study is limited secondary to patient rotation. A right-sided PICC line is seen with its distal end mildly curved upward within the distal superior vena cava. A right internal jugular venous catheter is noted, with its distal end overlying the junction of the superior vena cava and right atrium. Interval nasogastric feeding tube placement is noted with its  distal end overlying the body of the stomach. Mildly hazy appearing bilateral lung bases are seen which is likely positional in nature. There is no definite evidence of acute infiltrate, pleural effusion or pneumothorax. The cardiac silhouette is markedly enlarged and unchanged in size. The visualized skeletal structures are unremarkable. IMPRESSION: 1. Interval right internal jugular venous catheter and nasogastric feeding tube placement and positioning, as described above. 2. Right-sided PICC line with its distal end mildly curved upward within the distal superior vena cava. 3. Cardiomegaly with suspected positional hazy appearing bilateral lung  bases. Mild bibasilar airspace disease cannot be excluded. Electronically Signed   By: Virgina Norfolk M.D.   On: 04/17/2020 17:32   DG CHEST PORT 1 VIEW  Result Date: 04/10/2020 CLINICAL DATA:  Status post PICC line placement EXAM: PORTABLE CHEST 1 VIEW COMPARISON:  April 10, 2020 FINDINGS: The right PICC line is been advanced terminating in the central SVC. Stable cardiomegaly. The hila and mediastinum normal. Pulmonary venous congestion/mild edema persists. No focal infiltrate. No other acute abnormalities. IMPRESSION: Interval advancement of right PICC line into the central SVC. Electronically Signed   By: Dorise Bullion III M.D   On: 04/10/2020 14:36   DG CHEST PORT 1 VIEW  Result Date: 04/10/2020 CLINICAL DATA:  Dyspnea EXAM: PORTABLE CHEST 1 VIEW COMPARISON:  Radiograph 2022 FINDINGS: Right upper extremity PICC has been retracted with the tip positioned at the level of the mid SVC. Telemetry leads overlie the chest. Redemonstrated cardiomegaly. Atelectatic changes are again seen without lungs without new area of focal consolidation. Additional hazy and reticular opacities could be part of the atelectatic change or reflect mild edema or atypical infection. No pneumothorax or visible effusion. No acute osseous or soft tissue abnormality. IMPRESSION: Right  upper extremity PICC has been retracted with the tip positioned at the level of the mid SVC. Low volumes and atelectasis. Some additional hazy and reticular opacity, could be atelectatic versus mild edema or atypical infection. Correlate with clinical status. Electronically Signed   By: Lovena Le M.D.   On: 04/10/2020 01:05   DG CHEST PORT 1 VIEW  Result Date: 04/09/2020 CLINICAL DATA:  PICC line placement EXAM: PORTABLE CHEST 1 VIEW COMPARISON:  Chest x-ray dated 04/09/2020. FINDINGS: Stable cardiomegaly. Lungs are clear. No pleural effusion or pneumothorax is seen. RIGHT-sided PICC line appears well positioned with tip at the level the RIGHT atrium. IMPRESSION: 1. RIGHT-sided PICC line appears well positioned with tip at the level of the RIGHT atrium. 2. Stable cardiomegaly. 3. No evidence of pneumonia or pulmonary edema. Electronically Signed   By: Franki Cabot M.D.   On: 04/09/2020 16:22   DG Chest Portable 1 View  Result Date: 04/12/2020 CLINICAL DATA:  Altered mental status. EXAM: PORTABLE CHEST 1 VIEW COMPARISON:  Multiple priors, most recent 03/31/2020. FINDINGS: Similar enlarged cardiac silhouette. Low lung volumes. Mild interstitial opacities, favored chronic given similar appearance on prior chest radiograph from 03/02/2020 with normal chest CT from that same day. No consolidation. No visible pleural effusions or pneumothorax. IMPRESSION: 1. No evidence of acute cardiopulmonary disease. 2. Cardiomegaly. Electronically Signed   By: Margaretha Sheffield MD   On: 04/06/2020 13:37   DG Chest Port 1 View  Result Date: 03/30/2020 CLINICAL DATA:  Sepsis. EXAM: PORTABLE CHEST 1 VIEW COMPARISON:  March 02, 2020. FINDINGS: Stable cardiomegaly. No pneumothorax or pleural effusion is noted. Both lungs are clear. The visualized skeletal structures are unremarkable. IMPRESSION: No active disease. Electronically Signed   By: Marijo Conception M.D.   On: 03/30/2020 14:29   DG Chest Port 1V same  Day  Result Date: 03/31/2020 CLINICAL DATA:  76 year old female with shortness of breath. COVID-19. EXAM: PORTABLE CHEST 1 VIEW COMPARISON:  Portable chest 0222 hours today. Chest CT 03/02/2020 and earlier. FINDINGS: Portable AP semi upright view at 0714 hours. Stable cardiomegaly and mediastinal contours. Visualized tracheal air column is within normal limits. Improved lung volumes from earlier today. Diffuse reticulonodular increased pulmonary opacity. No pneumothorax, pleural effusion or confluent opacity. No acute osseous abnormality identified. IMPRESSION: Mild diffuse  reticulonodular opacity suspicious for COVID-19 pneumonia in this setting. Chronic cardiomegaly. No pleural effusion. Electronically Signed   By: Genevie Ann M.D.   On: 03/31/2020 07:41   DG Abd Portable 1V  Result Date: 04/09/2020 CLINICAL DATA:  COVID positive with abdominal distension. EXAM: PORTABLE ABDOMEN - 1 VIEW COMPARISON:  None. FINDINGS: The study is limited secondary to the patient's body habitus. The bowel gas pattern is normal. A large amount of stool is seen throughout the colon. No radio-opaque calculi or other significant radiographic abnormality are seen. IMPRESSION: 1. Large stool burden, without evidence of bowel obstruction. Electronically Signed   By: Virgina Norfolk M.D.   On: 04/25/2020 22:23   EEG adult  Result Date: 04/08/2020 Lora Havens, MD     04/08/2020  4:12 PM Patient Name: Stephanie Griffith MRN: [id number] Epilepsy Attending: Lora Havens Referring Physician/Provider: Dr Bonnielee Haff Date: 04/08/2020 Duration: 22.57 mins Patient history: 75yo F with COVId-19 and ams. EEG to evaluate for seizure Level of alertness: lethargic AEDs during EEG study: None Technical aspects: This EEG study was done with scalp electrodes positioned according to the 10-20 International system of electrode placement. Electrical activity was acquired at a sampling rate of '500Hz'$  and reviewed with a high frequency filter of '70Hz'$   and a low frequency filter of '1Hz'$ . EEG data were recorded continuously and digitally stored. Description: EEG showed continuous generalized 2-'3Hz'$  delta slowing. Generalized periodic discharges with triphasic morphology at '2hz'$  were noted. Hyperventilation and photic stimulation were not performed.   ABNORMALITY -Continuous slow, generalized -Periodic discharges with triphasic morphology, generalized IMPRESSION: This study showed generalized periodic discharges with triphasic morphology at '2hz'$  which is on the ictal-interictal continuum and can sometimes be seen with toxic-metabolic etiology. There is also  moderate to severe diffuse encephalopathy, nonspecific etiology. No seizures  were seen throughout the recording. If suspicion for interictal activity remains a concern, a prolonged study can be considered. Priyanka O Yadav   [place] EKG SITE RITE  Result Date: 04/09/2020 If Site Rite image not attached, placement could not be confirmed due to current cardiac rhythm.   Microbiology No results found for this or any previous visit (from the past 240 hour(s)).  Lab Basic Metabolic Panel: Recent Labs  Lab 04/21/20 0515 04/22/20 0547 04/22/20 2212 05/01/20 0158 05-01-20 0208 05-01-2020 2145  NA 136 136 136 139 137 131*  K 4.2 4.2 4.4 5.3* 4.9 >7.5*  CL 102 102 103 103  --  102  CO2 21* 20* 20* 18*  --  <7*  GLUCOSE 124* 154* 142* 205*  --  130*  BUN 84* 92* 101* 101*  --  99*  CREATININE 5.20* 5.44* 5.48* 6.12*  --  6.11*  CALCIUM 7.8* 7.8* 7.9* 8.2*  --  7.6*   Liver Function Tests: Recent Labs  Lab 04/22/20 2212 2020/05/01 0158  AST 23 39  ALT 19 26  ALKPHOS 95 101  BILITOT 0.7 0.6  PROT 6.0* 6.1*  ALBUMIN 1.6* 1.5*   No results for input(s): LIPASE, AMYLASE in the last 168 hours. Recent Labs  Lab 04/20/20 0506 04/22/20 0547 05/01/20 0157  AMMONIA 67* 78* 138*   CBC: Recent Labs  Lab 04/20/20 0506 04/21/20 0515 04/22/20 0547 04/22/20 2212 May 01, 2020 0158 05-01-20 0208  WBC  21.1* 18.0* 14.6* 13.4* 14.3*  --   NEUTROABS 18.3* 14.6* 11.3*  --  10.5*  --   HGB 7.2* 6.5* 8.0* 8.1* 8.2* 8.5*  HCT 23.9* 20.8* 24.9* 26.6* 27.6* 25.0*  MCV 97.2  95.9 93.6 97.1 101.1*  --   PLT 237 186 249 276 267  --    Cardiac Enzymes: No results for input(s): CKTOTAL, CKMB, CKMBINDEX, TROPONINI in the last 168 hours. Sepsis Labs: Recent Labs  Lab 04/21/20 0515 04/22/20 0547 04/22/20 2212 2020/05/21 0158  WBC 18.0* 14.6* 13.4* 14.3*   Procedures/Operations  HD catheter placement 04/20/2020   Shaneen Reeser A Rhythm Gubbels 04/26/2020, 11:33 AM

## 2020-05-03 NOTE — Progress Notes (Signed)
Pt transported from [id number] to [id number] with no complications.

## 2020-05-03 NOTE — Code Documentation (Signed)
  Patient Name: Stephanie Griffith   MRN: [id number]   Date of Birth/ Sex: 10/16/1945 , female      Admission Date: 04/30/2020  Attending Provider: Barb Merino, MD  Primary Diagnosis: Acute metabolic encephalopathy   Indication: Pt was in her usual state of health until this AM, when she was noted to be PEA arrest. Code blue was subsequently called. At the time of arrival on scene, ACLS protocol was underway.   Technical Description:  - CPR performance duration:  10 minutes  - Was defibrillation or cardioversion used? No   - Was external pacer placed? Yes  - Was patient intubated pre/post CPR? Yes   Medications Administered: Y = Yes; Blank = No Amiodarone    Atropine  Y  Calcium    Epinephrine  Y  Lidocaine    Magnesium    Norepinephrine    Phenylephrine    Sodium bicarbonate  Y  Vasopressin     Post CPR evaluation:  - Final Status - Was patient successfully resuscitated ? Yes - What is current rhythm? Sinus Tachycardia - What is current hemodynamic status? Critical   Miscellaneous Information:  - Labs sent, including: CBC,CMP,  ABG, ammonia  - Primary team notified?  Primary team was paged  - Family Notified? Yes  - Additional notes/ transfer status:  2H02     Gaylan Gerold, DO  2020-05-02, 1:26 AM

## 2020-05-03 NOTE — Progress Notes (Signed)
ABG collected and results given to MD.

## 2020-05-03 NOTE — Consult Note (Signed)
NAME:  Stephanie Griffith, MRN:  [id number], DOB:  February 05, 1946, LOS: 7 ADMISSION DATE:  04/21/2020, CONSULTATION DATE:  1/22 REFERRING MD:  [facility] CHIEF COMPLAINT:  Post arrest    Brief History:  75yo female with complex PMH including chronic diastolic heart failure, HTN, COPD, new ESRD, recent hospitalization for UTI and COVID PNA readmitted 1/6 with confusion, probable aspiration PNA. During this hospitalization, daughters have rescinded DNR and requested trial of dialysis. Has had worsening AMS/uremia throughout hospitalization.  Had first HD 1/19. On 1/22 had worsening mental status, hypoxia and ultimately cardiac arrest ~68mns downtime prior to ROSC. PCCM consulted for ICU care.   History of Present Illness:  89yofemale with complex PMH including chronic diastolic heart failure, HTN, COPD, new ESRD, recent hospitalization for UTI and COVID PNA readmitted 1/6 with confusion, probable aspiration PNA. During this hospitalization, daughters have rescinded DNR and requested trial of dialysis. Has had worsening AMS/uremia throughout hospitalization.  Had first HD 1/19. On 1/22 had worsening mental status, hypoxia and ultimately cardiac arrest ~119ms downtime prior to ROSC. PCCM consulted for ICU care.   Past Medical History:   has a past medical history of Anemia of chronic disease, Chronic diastolic CHF (congestive heart failure) (HCP[place] Chronic renal insufficiency, stage IV (severe) (HCC), COPD (chronic obstructive pulmonary disease) ([place] Hyperglycemia, Hyperlipidemia, Hypertension, PAD (peripheral artery disease) ([place] PAF (paroxysmal atrial fibrillation) (HC[place] PAT (paroxysmal atrial tachycardia) (HCMagn[place] Pulmonary hypertension (HCMyton and Umbilical hernia.   Significant Hospital Events:  PEA arrest 1/22  Consults:  Renal  Palliative care   Procedures:  R IJ HD cath 1/17>>> ETT 1/22>>>   Significant Diagnostic Tests:    Micro Data:  Covid 03/30/20>>> POS Urine 1/6>>> neg  BC x 2 1/6>>  neg   Antimicrobials:  Unasyn 1/19>>>  Interim History / Subjective:  Seen in ICU post arrest.  Tachycardic 150's. Unresponsive.   Objective   Blood pressure (!) 67/48, pulse (!) 42, temperature (!) 101.2 F (38.4 C), temperature source Oral, resp. rate (!) 22, height '5\' 8"'$  (1.727 m), weight 89 kg, SpO2 98 %.    Vent Mode: PRVC FiO2 (%):  [100 %] 100 % Set Rate:  [16 bmp] 16 bmp Vt Set:  [510 mL] 510 mL PEEP:  [8 cmH20] 8 cmH20 Plateau Pressure:  [28 cmH20] 28 cmH20   Intake/Output Summary (Last 24 hours) at 1/02-05-22155 Last data filed at 04/22/2020 2000 Gross per 24 hour  Intake 1092 ml  Output 1450 ml  Net -358 ml   Filed Weights   04/20/20 0730 04/20/20 0935 04/22/20 0334  Weight: 98.3 kg 97.3 kg 89 kg    Examination: General: chronically ill appearing female, now critically ill  HENT: mm dry, ETT  Lungs: resps even non labored on vent, coarse  Cardiovascular: s1s2 rrr  Abdomen: obese, soft  Extremities: warm and dry, L AKA  Neuro: unresponsive, not on sedation   Resolved Hospital Problem list     Assessment & Plan:   PEA arrest - ~1078m downtime.  PLAN -  Change epi gtt to levophed  normothermia - poor candidate for hypothermia protocol  Poor mental status prior to arrest  Pan culture  F/u labs  EKG  echo  Acute respiratory failure  PLAN -  Vent support - 8cc/kg  F/u CXR  F/u ABG VAP prevention   ESRD - not candidate for long term HD. Reportedly pt had declined HD as outpt. Daughters rescinded DNR and were pursuing trial of short term HD  PLAN -  Renal following  Poor candidate for HD - Did not tolerate 1/21    ?aspiration PNA  PLAN -  Continue abx as above  NPO   AFib with RVR  PLAN -  Holding metoprolol and cardizem for now immediately post arrest  EKG   Acute metabolic encephalopathy - baseline vascular dementia now c/b uremia and overall decline  PLAN -  PAD protocol on vent for sedation if needed  Not candidate for long term  dialysis  Poor prognosis overall - ongoing goals of care discussions with family   Anemia -  PLAN -  Continue aranesp  F/u cbc   Best practice (evaluated daily)  Diet: NPO Pain/Anxiety/Delirium protocol (if indicated): PRN fentanyl  VAP protocol (if indicated): ordered DVT prophylaxis: SQ heparin  GI prophylaxis: ppi Glucose control: SSI Mobility: BR Disposition:ICU - critically ill   Goals of Care:  Last date of multidisciplinary goals of care discussion:1/22 Family and staff present: daughters, Dr. Lake Bells, RN Summary of discussion: will update further once pt settled, stabilized, poor prognosis  Follow up goals of care discussion due: 1/29 Code Status: full  Labs   CBC: Recent Labs  Lab 04/18/20 0430 04/19/20 0633 04/20/20 0506 04/21/20 0515 04/22/20 0547 04/22/20 2212  WBC 12.3* 18.3* 21.1* 18.0* 14.6* 13.4*  NEUTROABS 8.8* 15.4* 18.3* 14.6* 11.3*  --   HGB 7.5* 7.2* 7.2* 6.5* 8.0* 8.1*  HCT 24.6* 24.4* 23.9* 20.8* 24.9* 26.6*  MCV 95.7 99.2 97.2 95.9 93.6 97.1  PLT 214 227 237 186 249 [id number]    Basic Metabolic Panel: Recent Labs  Lab 04/16/20 0439 04/17/20 0618 04/19/20 0633 04/20/20 0506 04/21/20 0515 04/22/20 0547 04/22/20 2212  NA 139   < > 138 138 136 136 136  K 3.8   < > 4.4 4.2 4.2 4.2 4.4  CL 103   < > 104 105 102 102 103  CO2 24   < > 21* 21* 21* 20* 20*  GLUCOSE 122*   < > 144* 177* 124* 154* 142*  BUN 71*   < > 94* 100* 84* 92* 101*  CREATININE 5.85*   < > 6.11* 6.06* 5.20* 5.44* 5.48*  CALCIUM 7.5*   < > 8.1* 8.1* 7.8* 7.8* 7.9*  MG 2.3  --   --   --   --   --   --    < > = values in this interval not displayed.   GFR: Estimated Creatinine Clearance: 10.5 mL/min (A) (by C-G formula based on SCr of 5.48 mg/dL (H)). Recent Labs  Lab 04/20/20 0506 04/21/20 0515 04/22/20 0547 04/22/20 2212  WBC 21.1* 18.0* 14.6* 13.4*    Liver Function Tests: Recent Labs  Lab 04/18/20 0430 04/22/20 2212  AST 33 23  ALT 19 19  ALKPHOS 108 95   BILITOT 0.7 0.7  PROT 5.6* 6.0*  ALBUMIN 1.7* 1.6*   No results for input(s): LIPASE, AMYLASE in the last 168 hours. Recent Labs  Lab 04/18/20 0520 04/19/20 0633 04/20/20 0506 04/22/20 0547  AMMONIA 118* 103* 67* 78*    ABG    Component Value Date/Time   PHART 7.217 (L) 04/22/2020 2241   PCO2ART 48.2 (H) 04/22/2020 2241   PO2ART 151 (H) 04/22/2020 2241   HCO3 18.7 (L) 04/22/2020 2241   TCO2 19 (L) 04/14/2020 1347   ACIDBASEDEF 7.6 (H) 04/22/2020 2241   O2SAT 98.4 04/22/2020 2241     Coagulation Profile: No results for input(s): INR, PROTIME in the last 168 hours.  Cardiac Enzymes: No  results for input(s): CKTOTAL, CKMB, CKMBINDEX, TROPONINI in the last 168 hours.  HbA1C: Hgb A1c MFr Bld  Date/Time Value Ref Range Status  12/29/2015 07:59 AM 5.3 4.8 - 5.6 % Final    Comment:    (NOTE)         Pre-diabetes: 5.7 - 6.4         Diabetes: >6.4         Glycemic control for adults with diabetes: <7.0   06/06/2011 08:00 PM 5.7 (H) <5.7 % Final    Comment:    (NOTE)                                                                       According to the ADA Clinical Practice Recommendations for 2011, when HbA1c is used as a screening test:  >=6.5%   Diagnostic of Diabetes Mellitus           (if abnormal result is confirmed) 5.7-6.4%   Increased risk of developing Diabetes Mellitus References:Diagnosis and Classification of Diabetes Mellitus,Diabetes [id number] 1):S62-S69 and Standards of Medical Care in         Diabetes - 2011,Diabetes [id number] (Suppl 1):S11-S61.    CBG: Recent Labs  Lab 04/22/20 1157 04/22/20 1511 04/22/20 2011 04/22/20 2337 04/28/20 0056  GLUCAP 150* 152* 133* 89 137*    Review of Systems:   Unable, post arrest on vent   Past Medical History:  She,  has a past medical history of Anemia of chronic disease, Chronic diastolic CHF (congestive heart failure) ([place]), Chronic renal insufficiency, stage IV (severe) (HCC), COPD  (chronic obstructive pulmonary disease) ([place]), Hyperglycemia, Hyperlipidemia, Hypertension, PAD (peripheral artery disease) ([place]), PAF (paroxysmal atrial fibrillation) ([place]), PAT (paroxysmal atrial tachycardia) ([place]), Pulmonary hypertension ([place]), and Umbilical hernia.   Surgical History:   Past Surgical History:  Procedure Laterality Date  . AMPUTATION  06/06/2011   Procedure: AMPUTATION DIGIT;  Surgeon: Elam Dutch, MD;  Location: [facility] OR;  Service: Vascular;  Laterality: Left;  Transmetatarsal amputation left great toe  . AMPUTATION  06/12/2011   Procedure: AMPUTATION ABOVE KNEE;  Surgeon: Elam Dutch, MD;  Location: [place];  Service: Vascular;  Laterality: Left;  . FEMORAL-POPLITEAL BYPASS GRAFT  06/06/2011   Procedure: BYPASS GRAFT FEMORAL-POPLITEAL ARTERY;  Surgeon: Elam Dutch, MD;  Location: [facility] OR;  Service: Vascular;  Laterality: Left;  left femoral to popliteal bypass with composite vein and propaten 28m x 80 graft  . IR FLUORO GUIDE CV LINE RIGHT  04/17/2020  . IR [place] ACCESS RIGHT  04/17/2020  . LOWER EXTREMITY ANGIOGRAM N/A 06/04/2011   Procedure: LOWER EXTREMITY ANGIOGRAM;  Surgeon: CAngelia Mould MD;  [facility];  Service: Cardiovascular;  Laterality: N/A;  . OMENTECTOMY  12/31/2015   Procedure: Partial OMENTECTOMY;  Surgeon: BGeorganna Skeans MD;  [place]  Service: General;;  . RIGHT HEART CATH N/A 02/13/2019   Procedure: RIGHT HEART CATH;  Surgeon: BJolaine Artist MD;  [place];  Service: Cardiovascular;  Laterality: N/A;  . TUBAL LIGATION    . VENTRAL HERNIA REPAIR N/A 12/31/2015   Procedure: REPAIR INCARCERATED VENTRAL HERNIA, POSSIBLE BOWEL RESECTION;  Surgeon: BGeorganna Skeans MD;  [place]  Service: General;  Laterality: N/A;     Social History:   reports that she quit smoking about 8 years ago. She has a 100.00 pack-year smoking history. She has never used smokeless tobacco. She reports that she does  not drink alcohol and does not use drugs.   Family History:  Her family history includes Diabetes in her mother; Heart attack in her brother and father; Heart disease in her father; Hypertension in her mother.   Allergies Allergies  Allergen Reactions  . Morphine And Related Other (See Comments)    AMS     Home Medications  Prior to Admission medications   Medication Sig Start Date End Date Taking? Authorizing Provider  amLODipine (NORVASC) 10 MG tablet Take 10 mg by mouth daily.  07/08/14  Yes [provider]  aspirin 81 MG EC tablet Take 81 mg by mouth daily.   Yes [provider]  atorvastatin (LIPITOR) 40 MG tablet Take 1 tablet (40 mg total) by mouth daily at 6 PM. Patient taking differently: Take 40 mg by mouth daily. 02/18/19 03/02/20 Yes Ghimire, Dante Gang, MD  calcitRIOL (ROCALTROL) 0.5 MCG capsule Take 0.5 mcg by mouth daily. 02/22/20  Yes [provider]  furosemide (LASIX) 40 MG tablet Take 1 tablet (40 mg total) by mouth daily. 02/19/19  Yes Barb Merino, MD  gabapentin (NEURONTIN) 100 MG capsule Take 100 mg by mouth 2 (two) times daily. 06/23/14  Yes [provider]  metoprolol tartrate (LOPRESSOR) 100 MG tablet Take 1 tablet (100 mg total) by mouth 2 (two) times daily. 04/04/20  Yes Thurnell Lose, MD  Multiple Vitamin (MULTIVITAMIN WITH MINERALS) TABS tablet Take 1 tablet by mouth daily.   Yes [provider]  sodium bicarbonate 650 MG tablet Take 650 mg by mouth in the morning, at noon, and at bedtime. 12/29/19  Yes [provider]  albuterol (VENTOLIN HFA) 108 (90 Base) MCG/ACT inhaler Inhale 2 puffs into the lungs every 6 (six) hours as needed for wheezing or shortness of breath. Patient not taking: Reported on 04/28/2020 04/04/20   Thurnell Lose, MD     Critical care time: 40 mins       Nickolas Madrid, NP Pulmonary/Critical Care Medicine  05/07/20  1:55 AM

## 2020-05-03 NOTE — Progress Notes (Signed)
[facility] Progress Note Patient Name: Stephanie Griffith DOB: [birth year] MRN: [id number]   Date of Service  05-23-20  HPI/Events of Note  Patient's blood pressure remains soft.  eICU Interventions  Diltiazem and Metoprolol doses cut in half and held for SBP < 120, Vasopressin increased to 0.04 mcg.        Kerry Kass Dandra Shambaugh May 23, 2020, 4:50 AM

## 2020-05-03 NOTE — Progress Notes (Signed)
Spoke with multiple family members at bedside and one person over the phone regarding futility of ongoing care, unfortunately, she is not going to improve from everything that we are doing so far.  Most of the family do agree and appreciate that this is end-stage disease process  I did discuss compassionate withdrawal of care  I did leave family to discuss amongst each other with respect to how we are moving forward with care   Continuing aggressive care is futile, will continue current care and continue to talk with family

## 2020-05-03 NOTE — Progress Notes (Signed)
Pt's grand-daughter Production assistant, radio) and daughter updated about pt not getting HD this shift. I explained to the family that the there was some concerns about the pt's status and the HD, RN reached out to RR for interventions. Pt brought to the unit and further interventions (ABG, chest xray, and bicarb admin) implemented to ensure that the pt was safe. I also explained to the family that the MD came to the unit to assess the pt and agreed that all the pt needed was HD after viewing the pt's chest-xray. After this being explained, while the family expressed frustration about the delay w/ the pt's HD, I informed them that a spot needed to be made for their mother/grandmother to go back to HD d/t COVID pt's being dialyzed during the night shifts. i[facility] implemented throughout this communication, eventhough the family expressed deep concern about the "pt being full code and all measures to be taken".

## 2020-05-03 NOTE — Progress Notes (Addendum)
eLink Physician-Brief Progress Note Patient Name: Stephanie Griffith DOB: 1946-03-26 MRN: [id number]   Date of Service  05-08-20  HPI/Events of Note  Persistent hypotension. Albumin 1.5 gm / dl.  eICU Interventions  25 % Albumin 25 gm iv x 1        [name] 05-08-20, 5:10 AM

## 2020-05-03 NOTE — Progress Notes (Signed)
NAME:  Stephanie Griffith, MRN:  [id number], DOB:  1945-12-15, LOS: 26 ADMISSION DATE:  04/06/2020, CONSULTATION DATE:  04/24/2019 REFERRING MD:  [facility], CHIEF COMPLAINT:  Post PEA arrest   Brief History:  75yo female with complex PMH including chronic diastolic heart failure, HTN, COPD, new ESRD, recent hospitalization for UTI and COVID PNA readmitted 1/6 with confusion, probable aspiration PNA. During this hospitalization, daughters have rescinded DNR and requested trial of dialysis. Has had worsening AMS/uremia throughout hospitalization.  Had first HD 1/19. On 1/22 had worsening mental status, hypoxia and ultimately cardiac arrest ~51mns downtime prior to ROSC. PCCM consulted for ICU care.   History of Present Illness:  74yofemale with complex PMH including chronic diastolic heart failure, HTN, COPD, new ESRD, recent hospitalization for UTI and COVID PNA readmitted 1/6 with confusion, probable aspiration PNA. During this hospitalization, daughters have rescinded DNR and requested trial of dialysis. Has had worsening AMS/uremia throughout hospitalization.  Had first HD 1/19. On 1/22 had worsening mental status, hypoxia and ultimately cardiac arrest ~167ms downtime prior to ROSC. PCCM consulted for ICU care  Past Medical History:   has a past medical history of Anemia of chronic disease, Chronic diastolic CHF (congestive heart failure) ([place] Chronic renal insufficiency, stage IV (severe) ([facility] COPD (chronic obstructive pulmonary disease) ([facility] Hyperglycemia, Hyperlipidemia, Hypertension, PAD (peripheral artery disease) ([facility] PAF (paroxysmal atrial fibrillation) ([facility] PAT (paroxysmal atrial tachycardia) ([facility] Pulmonary hypertension ([facility] and Umbilical hernia.   Significant Hospital Events:  PEA arrest 1/22  Consults:  Renal  Palliative care   Procedures:  R IJ HD cath 1/17>>> ETT 1/22>>>   Significant Diagnostic Tests:    Micro Data:  Covid 03/30/20>>> POS Urine 1/6>>>  neg  BC x 2 1/6>> neg   Antimicrobials:  Unasyn 1/19>>>   Interim History / Subjective:  Post cardiac arrest, on multiple pressors Unresponsive  Objective   Blood pressure (!) 80/63, pulse (!) 115, temperature 98.1 F (36.7 C), resp. rate 18, height '5\' 8"'$  (1.727 m), weight 89 kg, SpO2 95 %.    Vent Mode: PRVC FiO2 (%):  [50 %-100 %] 50 % Set Rate:  [16 bmp-28 bmp] 28 bmp Vt Set:  [510 mL] 510 mL PEEP:  [8 cmH20] 8 cmH20 Plateau Pressure:  [28 cmH20-31 cmH20] 31 cmH20   Intake/Output Summary (Last 24 hours) at 1/February 04, 2022802 Last data filed at 04/03/02/2022600 Gross per 24 hour  Intake 1522.94 ml  Output 1200 ml  Net 322.94 ml   Filed Weights   04/20/20 0730 04/20/20 0935 04/22/20 0334  Weight: 98.3 kg 97.3 kg 89 kg    Examination: General: Chronically ill-appearing HENT: Dry oral mucosa, endotracheal tube in place Lungs: Decreased air movement bilaterally, no rhonchi Cardiovascular: S1-S2 appreciated with no murmur Abdomen: Soft, bowel sounds appreciated Extremities: Left above-knee amputation, changes of chronic venous stasis on the right Neuro: Unresponsive GU:   Chest x-ray 1/21 with pulmonary congestion  Resolved Hospital Problem list     Assessment & Plan:  PEA arrest with a downtime of about 10 minutes -Normothermia -Requiring multiple pressors -On epinephrine, Levophed Mental status was poor prior to arrest with encephalopathy Was documented as obtunded even prior to her sustaining the PEA arrest  Acute hypoxemic respiratory failure -Continue full ventilator support -VAP prevention  End-stage renal disease -Patient has had 2 sessions of dialysis -Last 1 was not very successful -Renal service continues to follow  Metabolic encephalopathy -Not on any sedation at present and does not have any purposeful response  to stimulation -Possibly component of anoxic encephalopathy added onto the background encephalopathy she had  Paroxysmal atrial  fibrillation -Required amiodarone bolus -Was on diltiazem and metoprolol-held for hypotension -  History of pulmonary hypertension -Last echo did indicate systolic right-sided pressure of over 100  Overall prognosis appears very poor with multiple background medical problems Not tolerating dialysis Persistent encephalopathy Very severe pulmonary hypertension at baseline Requiring multiple pressors at present  Patient was made DNR Compassionate withdrawal of care would be most appropriate in this situation as she has no realistic chance of meaningful recovery  Best practice (evaluated daily)  Diet: N.p.o. Pain/Anxiety/Delirium protocol (if indicated): Not on any sedation at present VAP protocol (if indicated): In place DVT prophylaxis: On heparin GI prophylaxis: Protonix Glucose control: We will monitor Mobility: Bedrest Disposition: ICU  Goals of Care:  Last date of multidisciplinary goals of care discussion: 24-Apr-2020 Family and staff present:  Summary of discussion: As documented by Dr. Lake Bells Follow up goals of care discussion due: Ongoing Code Status: DNR  Labs   CBC: Recent Labs  Lab 04/19/20 0633 04/20/20 0506 04/21/20 0515 04/22/20 0547 04/22/20 2212 04/25/2020 0158 04/12/2020 0208  WBC 18.3* 21.1* 18.0* 14.6* 13.4* 14.3*  --   NEUTROABS 15.4* 18.3* 14.6* 11.3*  --  10.5*  --   HGB 7.2* 7.2* 6.5* 8.0* 8.1* 8.2* 8.5*  HCT 24.4* 23.9* 20.8* 24.9* 26.6* 27.6* 25.0*  MCV 99.2 97.2 95.9 93.6 97.1 101.1*  --   PLT 227 237 186 249 276 267  --     Basic Metabolic Panel: Recent Labs  Lab 04/20/20 0506 04/21/20 0515 04/22/20 0547 04/22/20 2212 Apr 24, 2020 0158 April 24, 2020 0208  NA 138 136 136 136 139 137  K 4.2 4.2 4.2 4.4 5.3* 4.9  CL 105 102 102 103 103  --   CO2 21* 21* 20* 20* 18*  --   GLUCOSE 177* 124* 154* 142* 205*  --   BUN 100* 84* 92* 101* 101*  --   CREATININE 6.06* 5.20* 5.44* 5.48* 6.12*  --   CALCIUM 8.1* 7.8* 7.8* 7.9* 8.2*  --     GFR: Estimated Creatinine Clearance: 9.4 mL/min (A) (by C-G formula based on SCr of 6.12 mg/dL (H)). Recent Labs  Lab 04/21/20 0515 04/22/20 0547 04/22/20 2212 24-Apr-2020 0158  WBC 18.0* 14.6* 13.4* 14.3*    Liver Function Tests: Recent Labs  Lab 04/18/20 0430 04/22/20 2212 04/02/2020 0158  AST 33 23 39  ALT '19 19 26  '$ ALKPHOS 108 95 101  BILITOT 0.7 0.7 0.6  PROT 5.6* 6.0* 6.1*  ALBUMIN 1.7* 1.6* 1.5*   No results for input(s): LIPASE, AMYLASE in the last 168 hours. Recent Labs  Lab 04/18/20 0520 04/19/20 0633 04/20/20 0506 04/22/20 0547 04/11/2020 0157  AMMONIA 118* 103* 67* 78* 138*    ABG    Component Value Date/Time   PHART 7.120 (LL) Apr 24, 2020 0208   PCO2ART 57.3 (H) 04/14/2020 0208   PO2ART 230 (H) 04/02/2020 0208   HCO3 18.3 (L) 24-Apr-2020 0208   TCO2 20 (L) 04/20/2020 0208   ACIDBASEDEF 10.0 (H) 05/02/2020 0208   O2SAT 100.0 04/08/2020 0208     Coagulation Profile: Recent Labs  Lab 04/03/2020 0241  INR 1.2    Cardiac Enzymes: No results for input(s): CKTOTAL, CKMB, CKMBINDEX, TROPONINI in the last 168 hours.  HbA1C: Hgb A1c MFr Bld  Date/Time Value Ref Range Status  12/29/2015 07:59 AM 5.3 4.8 - 5.6 % Final    Comment:    (NOTE)  Pre-diabetes: 5.7 - 6.4         Diabetes: >6.4         Glycemic control for adults with diabetes: <7.0   06/06/2011 08:00 PM 5.7 (H) <5.7 % Final    Comment:    (NOTE)                                                                       According to the ADA Clinical Practice Recommendations for 2011, when HbA1c is used as a screening test:  >=6.5%   Diagnostic of Diabetes Mellitus           (if abnormal result is confirmed) 5.7-6.4%   Increased risk of developing Diabetes Mellitus References:Diagnosis and Classification of Diabetes Mellitus,Diabetes [id number] 1):S62-S69 and Standards of Medical Care in         Diabetes - 2011,Diabetes [id number] (Suppl 1):S11-S61.    CBG: Recent Labs   Lab 04/22/20 2337 05/11/20 0056 May 11, 2020 0357 May 11, 2020 0736 2020/05/11 0741  GLUCAP 89 137* 131* 39* 70    Review of Systems:   Unobtainable  Past Medical History:  She,  has a past medical history of Anemia of chronic disease, Chronic diastolic CHF (congestive heart failure) ([place]), Chronic renal insufficiency, stage IV (severe) (HCC), COPD (chronic obstructive pulmonary disease) ([place]), Hyperglycemia, Hyperlipidemia, Hypertension, PAD (peripheral artery disease) ([place]), PAF (paroxysmal atrial fibrillation) ([place]), PAT (paroxysmal atrial tachycardia) ([place]), Pulmonary hypertension ([place]), and Umbilical hernia.   Surgical History:   Past Surgical History:  Procedure Laterality Date  . AMPUTATION  06/06/2011   Procedure: AMPUTATION DIGIT;  Surgeon: Elam Dutch, MD;  [facility];  Service: Vascular;  Laterality: Left;  Transmetatarsal amputation left great toe  . AMPUTATION  06/12/2011   Procedure: AMPUTATION ABOVE KNEE;  Surgeon: Elam Dutch, MD;  Location: [facility];  Service: Vascular;  Laterality: Left;  . FEMORAL-POPLITEAL BYPASS GRAFT  06/06/2011   Procedure: BYPASS GRAFT FEMORAL-POPLITEAL ARTERY;  Surgeon: Elam Dutch, MD;  [facility];  Service: Vascular;  Laterality: Left;  left femoral to popliteal bypass with composite vein and propaten 40m x 80 graft  . IR FLUORO GUIDE CV LINE RIGHT  04/17/2020  . IR [place] ACCESS RIGHT  04/17/2020  . LOWER EXTREMITY ANGIOGRAM N/A 06/04/2011   Procedure: LOWER EXTREMITY ANGIOGRAM;  Surgeon: CAngelia Mould MD;  [facility];  Service: Cardiovascular;  Laterality: N/A;  . OMENTECTOMY  12/31/2015   Procedure: Partial OMENTECTOMY;  Surgeon: BGeorganna Skeans MD;  [place]  Service: General;;  . RIGHT HEART CATH N/A 02/13/2019   Procedure: RIGHT HEART CATH;  Surgeon: BJolaine Artist MD;  [facility];  Service: Cardiovascular;  Laterality: N/A;  . TUBAL LIGATION    . VENTRAL HERNIA REPAIR  N/A 12/31/2015   Procedure: REPAIR INCARCERATED VENTRAL HERNIA, POSSIBLE BOWEL RESECTION;  Surgeon: BGeorganna Skeans MD;  [place]  Service: General;  Laterality: N/A;     Social History:   reports that she quit smoking about 8 years ago. She has a 100.00 pack-year smoking history. She has never used smokeless tobacco. She reports that she does not drink alcohol and does not use drugs.   Family History:  Her family history  includes Diabetes in her mother; Heart attack in her brother and father; Heart disease in her father; Hypertension in her mother.   Allergies Allergies  Allergen Reactions  . Morphine And Related Other (See Comments)    AMS     Home Medications  Prior to Admission medications   Medication Sig Start Date End Date Taking? Authorizing Provider  amLODipine (NORVASC) 10 MG tablet Take 10 mg by mouth daily.  07/08/14  Yes [provider]  aspirin 81 MG EC tablet Take 81 mg by mouth daily.   Yes [provider]  atorvastatin (LIPITOR) 40 MG tablet Take 1 tablet (40 mg total) by mouth daily at 6 PM. Patient taking differently: Take 40 mg by mouth daily. 02/18/19 03/02/20 Yes Ghimire, Dante Gang, MD  calcitRIOL (ROCALTROL) 0.5 MCG capsule Take 0.5 mcg by mouth daily. 02/22/20  Yes [provider]  furosemide (LASIX) 40 MG tablet Take 1 tablet (40 mg total) by mouth daily. 02/19/19  Yes Barb Merino, MD  gabapentin (NEURONTIN) 100 MG capsule Take 100 mg by mouth 2 (two) times daily. 06/23/14  Yes [provider]  metoprolol tartrate (LOPRESSOR) 100 MG tablet Take 1 tablet (100 mg total) by mouth 2 (two) times daily. 04/04/20  Yes Thurnell Lose, MD  Multiple Vitamin (MULTIVITAMIN WITH MINERALS) TABS tablet Take 1 tablet by mouth daily.   Yes [provider]  sodium bicarbonate 650 MG tablet Take 650 mg by mouth in the morning, at noon, and at bedtime. 12/29/19  Yes [provider]  albuterol (VENTOLIN HFA) 108 (90 Base)  MCG/ACT inhaler Inhale 2 puffs into the lungs every 6 (six) hours as needed for wheezing or shortness of breath. Patient not taking: Reported on 04/16/2020 04/04/20   Thurnell Lose, MD     Patient remains critically ill Titration of multiple pressors  The patient is critically ill with multiple organ systems failure and requires high complexity decision making for assessment and support, frequent evaluation and titration of therapies, application of advanced monitoring technologies and extensive interpretation of multiple databases. Critical Care Time devoted to patient care services described in this note independent of APP/resident time (if applicable)  is 40 minutes.   Sherrilyn Rist MD [facility] Personal pager: [phone] If unanswered, please page CCM On-call: [phone]

## 2020-05-03 NOTE — Progress Notes (Signed)
Pt noted to be more hemodynamically unstable around 0030 during 1 hr neuro checks d/t MEWS being RED. RRT initially called d/t pt HR, BP, and RR decreasing. While speaking with Mindy, RRT, RN on the phone, I called code blue d/t pt going into asystole. Team at bedside w/in minutes, CPR initiated.

## 2020-05-03 NOTE — Progress Notes (Signed)
Spoke with patient's daughter Ms. Moffit  She is on her way to the hospital and I will talk to her when she gets here as well She is going to make contact with other family members as well  Prognosis as I related to her  is grim Compassionate withdrawal of care is most appropriate

## 2020-05-03 NOTE — Progress Notes (Signed)
[facility] Progress Note Patient Name: Stephanie Griffith DOB: 11/29/1945 MRN: [id number]   Date of Service  2020/05/06  HPI/Events of Note  Patient with A-fib with RVR, rates up to 170, patient is on 30 mcg of Norepinephrine.  eICU Interventions  Will give Amiodarone 150 mg iv bolus, start Phenylephrine infusion and wean Norepinephrine infusion, as tolerated, to off if possible.        Kerry Kass Samreet [place] May 06, 2020, 2:44 AM

## 2020-05-03 NOTE — Progress Notes (Signed)
Critical care note: Date of note: 04/23/2019 Care started: 21:50  Subjective: Patient had RRT due to initially suspected altered mental status with decreased responsiveness however the patient was not significantly different from her baseline earlier today.  Hemodialysis RN was not comfortable placing her on hemodialysis.  She had a temperature of 99.9 with heart rate of 110, blood pressure 130/69 with respiratory rate of 33.  She was placed on her percent nonrebreather with a pulse oximetry 100%.  Orders were placed including 40 mg of IV Lasix, portable chest x-ray, CBC, CMP and D-dimer as well as EKG.  Objective: Generally: Obtunded and unresponsive elderly African-American female with some response to painful stimuli.  She was in mild to moderate respiratory distress with accessory muscle use. Vital signs as above Head - atraumatic, normocephalic.  Pupils - equal, round and reactive to light and accommodation.  No scleral icterus.   Oropharynx - moist mucous membranes and tongue. Neck - supple. No JVD. Carotid pulses 2+ bilaterally. No carotid bruits. No palpable thyromegaly or lymphadenopathy. Cardiovascular - regular rate and rhythm. Normal S1 and S2. No murmurs, gallops or rubs.  Lungs -diminished bibasal breath sounds with bibasal and midlung zone crackles and coarse breath sounds diffusely. Abdomen - soft and nontender. Positive bowel sounds. No palpable organomegaly or masses.  Extremities - no pitting edema, clubbing or cyanosis.  Neuro - grossly non-focal. Skin - no rashes. Breast, pelvic and rectal - deferred.  Labs and notes were reviewed. - Chest x-ray showed persistent interstitial pulmonary edema. -EKG showed atrial fibrillation with rapid ventricular response of 126 with right axis deviation. - CBC was remarkable for leukocytosis to 13.4 down from 14.6 and hemoglobin hematocrit were 8.1/26.6 slightly better than yesterday.  CMP was remarkable for a BUN of 101 and creatinine  5.48 with troponin of 6 and albumin of 1.6 and calcium 7.9 with a potassium of 4.4.  Her D-dimer came back 19.4.  ABG showed pH 7.217, PCO2 48, PO2 of 151 HCO3 18.7 and O2 sat 98.4.  Assessment/plan: 1.  Acute hypoxic respiratory failure, likely secondary to end-stage renal disease requiring hemodialysis acute CHF possibly related to fluid overload as well as aspiration pneumonia. - The patient was given 40 mg of IV Lasix and her nonrebreather was switched to 2 L of O2 by nasal cannula with adequate pulse oximetry of 98-100%. - Given her persistent accessory muscle use and increased work of breathing in the setting of stable blood pressure and pulse oximetry hemodialysis was thought to be reasonable after patient was stabilized.  2.  Metabolic acidosis likely with associated acute kidney injury. - The patient was given 2 doses of 50 mill equivalent IV sodium bicarbonate separated by couple of hours. - This should improve with hemodialysis.  3.  Atrial fibrillation with rapid ventricular response. - The patient's rate has responded to metoprolol and Cardizem.  4.  Sepsis secondary to pneumonia and manifested by fever and tachycardia with leukocytosis. - The patient is currently on IV Unasyn. - Antipyretics will be provided blood cultures will be followed.  The family was notified about the delay of the dialysis and the reason and they want to have the patient continue on full CODE STATUS.  We will continue to closely monitor her.  Critical care time spent 45 minutes Authorized and performed by: Eugenie Norrie, MD Total critical care time: Approximately 45    minutes. Due to a high probability of clinically significant, life-threatening deterioration, the patient required my highest level of preparedness to  intervene emergently and I personally spent this critical care time directly and personally managing the patient.  This critical care time included obtaining a history, examining the patient,  pulse oximetry, ordering and review of studies, arranging urgent treatment with development of management plan, evaluation of patient's response to treatment, frequent reassessment, and discussions with other providers. This critical care time was performed to assess and manage the high probability of imminent, life-threatening deterioration that could result in multiorgan failure.  It was exclusive of separately billable procedures and treating other patients and teaching time.  Please see MDM section and the rest of the note for further information on patient assessment and treatment.

## 2020-05-03 NOTE — Progress Notes (Signed)
CRITICAL VALUE ALERT  Critical Value: BG 16 (on 4th repeat - drawn from central line from two different nurses)  Date & Time Notied: 05-02-2020 2015  Provider Notified: Oletta Darter, MD  Orders Received/Actions taken: Hypoglycemic Protocol followed. 12.'5mg'$  D50 given IV. Recheck in 15 minutes. D10 infusion @ 72m/hr initiated.

## 2020-05-03 NOTE — Progress Notes (Signed)
   05-12-2020 0040  Clinical Encounter Type  Visited With Patient and family together  Visit Type Code  Referral From Nurse  Consult/Referral To Chaplain  Chaplain responded to code. Chaplain provided the ministry of presence, comfort and support to the patient's youngest daughter. Will follow up as needed. This note was prepared by Jeanine Luz, M.Div..  For questions please contact by phone [phone].  Chaplain on call: [employer]

## 2020-05-03 NOTE — Progress Notes (Signed)
eLink Physician-Brief Progress Note Patient Name: Stephanie Griffith DOB: [birth year] MRN: [id number]   Date of Service  05/01/20  HPI/Events of Note  Hypoglycemia - Blood glucose <10 and 17. Tube feeds stopped today. Request for BMP STAT. I do not see orders for insulin on the medication list.   eICU Interventions  Plan: 1. Hypoglycemia order set.  2. BMP STAT. 3. D10W to run IV at 50 mL/hour.      Intervention Category Major Interventions: Other:  Lysle Dingwall 05/01/2020, 8:17 PM

## 2020-05-03 NOTE — Progress Notes (Signed)
eLink Physician-Brief Progress Note Patient Name: Stephanie Griffith DOB: Jul 06, 1945 MRN: [id number]   Date of Service  05-06-20  HPI/Events of Note  Patient with ESRD-DD and altered mental status who went into cardiac arrest on the floor and was transferred to [facility] following resuscitation, patient is intubated and mechanically ventilated.  eICU Interventions  New Patient Evaluation completed. 12 lead EKG ordered.         Kerry Kass Nandita Mathenia 2020/05/06, 1:29 AM

## 2020-05-03 NOTE — Progress Notes (Signed)
  CRITICAL VALUE ALERT  Critical Value: BG 46 (on repeat - drawn from central line)  Date & Time Notied: 05-06-2020 1133  Provider Notified: Ander Slade, MD  Orders Received/Actions taken: Hypoglycemic Protocol followed. 12.'5mg'$  D50 given IV. Recheck in 15 minutes. Will administer remaining amount if BG still low.

## 2020-05-03 NOTE — Procedures (Signed)
Extubation Procedure Note  Patient Details:   Name: Stephanie Griffith DOB: 01-10-1946 MRN: [id number]   Airway Documentation:  Airway 7.5 mm (Active)  Secured at (cm) 23 cm May 20, 2020 1923  Measured From Lips 05/20/20 1923  Secured Location Right 05/20/2020 1923  Secured By [employer] 20-May-2020 1923  Tube Holder Repositioned Yes 20-May-2020 1923  Prone position No 2020/05/20 1923  Cuff Pressure (cm H2O) 30 cm H2O 2020-05-20 1923  Site Condition Dry 20-May-2020 1923   Vent end date: 05-20-20 Vent end time: 2315   Evaluation  O2 sats:  Complications: No apparent complications Patient did tolerate procedure well.   No   Pt extubated after she passed away.   Alberteen Spindle 05/20/20, 11:48 PM

## 2020-05-03 DEATH — deceased
